# Patient Record
Sex: Female | Born: 1965
Health system: Southern US, Community
[De-identification: ages and names within clinical notes are randomized; demographics above are authoritative.]

## PROBLEM LIST (undated history)

## (undated) ENCOUNTER — Ambulatory Visit (HOSPITAL_COMMUNITY): Admission: EM | Source: Home / Self Care

## (undated) DIAGNOSIS — J449 Chronic obstructive pulmonary disease, unspecified: Secondary | ICD-10-CM

## (undated) DIAGNOSIS — F32A Depression, unspecified: Secondary | ICD-10-CM

## (undated) DIAGNOSIS — F1910 Other psychoactive substance abuse, uncomplicated: Secondary | ICD-10-CM

## (undated) DIAGNOSIS — J45909 Unspecified asthma, uncomplicated: Secondary | ICD-10-CM

## (undated) DIAGNOSIS — F419 Anxiety disorder, unspecified: Secondary | ICD-10-CM

## (undated) DIAGNOSIS — M545 Low back pain, unspecified: Secondary | ICD-10-CM

## (undated) DIAGNOSIS — B009 Herpesviral infection, unspecified: Secondary | ICD-10-CM

## (undated) DIAGNOSIS — R519 Headache, unspecified: Secondary | ICD-10-CM

## (undated) DIAGNOSIS — F329 Major depressive disorder, single episode, unspecified: Secondary | ICD-10-CM

## (undated) DIAGNOSIS — R51 Headache: Secondary | ICD-10-CM

## (undated) DIAGNOSIS — D259 Leiomyoma of uterus, unspecified: Secondary | ICD-10-CM

## (undated) DIAGNOSIS — R0600 Dyspnea, unspecified: Secondary | ICD-10-CM

## (undated) DIAGNOSIS — Z9889 Other specified postprocedural states: Secondary | ICD-10-CM

## (undated) DIAGNOSIS — N9489 Other specified conditions associated with female genital organs and menstrual cycle: Secondary | ICD-10-CM

## (undated) DIAGNOSIS — K769 Liver disease, unspecified: Secondary | ICD-10-CM

## (undated) DIAGNOSIS — J302 Other seasonal allergic rhinitis: Secondary | ICD-10-CM

## (undated) DIAGNOSIS — G5600 Carpal tunnel syndrome, unspecified upper limb: Secondary | ICD-10-CM

## (undated) DIAGNOSIS — M1990 Unspecified osteoarthritis, unspecified site: Secondary | ICD-10-CM

## (undated) DIAGNOSIS — Z9989 Dependence on other enabling machines and devices: Secondary | ICD-10-CM

## (undated) DIAGNOSIS — I499 Cardiac arrhythmia, unspecified: Secondary | ICD-10-CM

## (undated) DIAGNOSIS — K219 Gastro-esophageal reflux disease without esophagitis: Secondary | ICD-10-CM

## (undated) DIAGNOSIS — G8929 Other chronic pain: Secondary | ICD-10-CM

## (undated) DIAGNOSIS — R0789 Other chest pain: Secondary | ICD-10-CM

## (undated) DIAGNOSIS — D573 Sickle-cell trait: Secondary | ICD-10-CM

## (undated) DIAGNOSIS — I10 Essential (primary) hypertension: Secondary | ICD-10-CM

## (undated) DIAGNOSIS — R112 Nausea with vomiting, unspecified: Secondary | ICD-10-CM

## (undated) DIAGNOSIS — Z9189 Other specified personal risk factors, not elsewhere classified: Secondary | ICD-10-CM

## (undated) DIAGNOSIS — N898 Other specified noninflammatory disorders of vagina: Secondary | ICD-10-CM

## (undated) HISTORY — DX: Essential (primary) hypertension: I10

## (undated) HISTORY — DX: Sickle-cell trait: D57.3

## (undated) HISTORY — PX: TUBAL LIGATION: SHX77

## (undated) HISTORY — DX: Other specified conditions associated with female genital organs and menstrual cycle: N94.89

## (undated) HISTORY — DX: Leiomyoma of uterus, unspecified: D25.9

## (undated) HISTORY — PX: SALPINGECTOMY: SHX328

## (undated) HISTORY — DX: Other seasonal allergic rhinitis: J30.2

## (undated) HISTORY — DX: Chronic obstructive pulmonary disease, unspecified: J44.9

## (undated) HISTORY — DX: Liver disease, unspecified: K76.9

---
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
## 1998-03-10 ENCOUNTER — Emergency Department (HOSPITAL_COMMUNITY): Admission: EM | Admit: 1998-03-10 | Discharge: 1998-03-10 | Payer: Self-pay | Admitting: Emergency Medicine

## 1998-06-04 ENCOUNTER — Encounter: Payer: Self-pay | Admitting: Emergency Medicine

## 1998-06-04 ENCOUNTER — Emergency Department (HOSPITAL_COMMUNITY): Admission: EM | Admit: 1998-06-04 | Discharge: 1998-06-04 | Payer: Self-pay | Admitting: Emergency Medicine

## 1998-09-14 ENCOUNTER — Encounter: Payer: Self-pay | Admitting: Emergency Medicine

## 1998-09-14 ENCOUNTER — Emergency Department (HOSPITAL_COMMUNITY): Admission: EM | Admit: 1998-09-14 | Discharge: 1998-09-14 | Payer: Self-pay | Admitting: Emergency Medicine

## 2000-12-10 ENCOUNTER — Emergency Department (HOSPITAL_COMMUNITY): Admission: EM | Admit: 2000-12-10 | Discharge: 2000-12-10 | Payer: Self-pay | Admitting: Emergency Medicine

## 2000-12-10 ENCOUNTER — Encounter: Payer: Self-pay | Admitting: Emergency Medicine

## 2004-03-28 ENCOUNTER — Emergency Department (HOSPITAL_COMMUNITY): Admission: EM | Admit: 2004-03-28 | Discharge: 2004-03-28 | Payer: Self-pay | Admitting: Family Medicine

## 2004-09-24 ENCOUNTER — Emergency Department (HOSPITAL_COMMUNITY): Admission: EM | Admit: 2004-09-24 | Discharge: 2004-09-24 | Payer: Self-pay | Admitting: *Deleted

## 2005-08-26 ENCOUNTER — Emergency Department (HOSPITAL_COMMUNITY): Admission: EM | Admit: 2005-08-26 | Discharge: 2005-08-26 | Payer: Self-pay | Admitting: Emergency Medicine

## 2006-09-29 ENCOUNTER — Emergency Department (HOSPITAL_COMMUNITY): Admission: EM | Admit: 2006-09-29 | Discharge: 2006-09-29 | Payer: Self-pay | Admitting: Emergency Medicine

## 2007-05-10 ENCOUNTER — Emergency Department (HOSPITAL_COMMUNITY): Admission: EM | Admit: 2007-05-10 | Discharge: 2007-05-10 | Payer: Self-pay | Admitting: Emergency Medicine

## 2008-05-01 ENCOUNTER — Emergency Department (HOSPITAL_COMMUNITY): Admission: EM | Admit: 2008-05-01 | Discharge: 2008-05-02 | Payer: Self-pay | Admitting: Emergency Medicine

## 2008-11-26 ENCOUNTER — Emergency Department (HOSPITAL_COMMUNITY): Admission: EM | Admit: 2008-11-26 | Discharge: 2008-11-27 | Payer: Self-pay | Admitting: Emergency Medicine

## 2010-08-25 ENCOUNTER — Encounter (HOSPITAL_COMMUNITY): Payer: Self-pay

## 2010-08-25 ENCOUNTER — Ambulatory Visit (INDEPENDENT_AMBULATORY_CARE_PROVIDER_SITE_OTHER): Payer: Self-pay

## 2010-08-25 ENCOUNTER — Inpatient Hospital Stay (INDEPENDENT_AMBULATORY_CARE_PROVIDER_SITE_OTHER)
Admission: RE | Admit: 2010-08-25 | Discharge: 2010-08-25 | Disposition: A | Discharge status: Home / Self Care | Payer: Self-pay | Source: Ambulatory Visit | Attending: Family Medicine | Admitting: Family Medicine

## 2010-08-25 DIAGNOSIS — M542 Cervicalgia: Secondary | ICD-10-CM

## 2010-10-23 ENCOUNTER — Emergency Department (HOSPITAL_COMMUNITY)
Admission: EM | Admit: 2010-10-23 | Discharge: 2010-10-23 | Disposition: A | Discharge status: Home / Self Care | Payer: Self-pay | Attending: Emergency Medicine | Admitting: Emergency Medicine

## 2010-10-23 ENCOUNTER — Emergency Department (HOSPITAL_COMMUNITY): Payer: Self-pay

## 2010-10-23 DIAGNOSIS — R209 Unspecified disturbances of skin sensation: Secondary | ICD-10-CM | POA: Insufficient documentation

## 2010-10-23 DIAGNOSIS — R29898 Other symptoms and signs involving the musculoskeletal system: Secondary | ICD-10-CM | POA: Insufficient documentation

## 2010-10-23 DIAGNOSIS — M542 Cervicalgia: Secondary | ICD-10-CM | POA: Insufficient documentation

## 2010-10-23 DIAGNOSIS — M546 Pain in thoracic spine: Secondary | ICD-10-CM | POA: Insufficient documentation

## 2010-10-23 DIAGNOSIS — M25519 Pain in unspecified shoulder: Secondary | ICD-10-CM | POA: Insufficient documentation

## 2010-10-23 DIAGNOSIS — R071 Chest pain on breathing: Secondary | ICD-10-CM | POA: Insufficient documentation

## 2010-10-23 LAB — CK TOTAL AND CKMB (NOT AT ARMC)
CK, MB: 2.2 ng/mL (ref 0.3–4.0)
Total CK: 123 U/L (ref 7–177)

## 2010-10-23 LAB — DIFFERENTIAL
Basophils Relative: 1 % (ref 0–1)
Monocytes Relative: 7 % (ref 3–12)
Neutro Abs: 3.6 10*3/uL (ref 1.7–7.7)
Neutrophils Relative %: 46 % (ref 43–77)

## 2010-10-23 LAB — COMPREHENSIVE METABOLIC PANEL
ALT: 12 U/L (ref 0–35)
AST: 16 U/L (ref 0–37)
Albumin: 3.9 g/dL (ref 3.5–5.2)
Calcium: 9.5 mg/dL (ref 8.4–10.5)
Sodium: 137 mEq/L (ref 135–145)
Total Protein: 8.1 g/dL (ref 6.0–8.3)

## 2010-10-23 LAB — TROPONIN I: Troponin I: <0.3 ng/mL (ref <0.30)

## 2010-10-23 LAB — CBC
Hemoglobin: 13.6 g/dL (ref 12.0–15.0)
MCH: 32.9 pg (ref 26.0–34.0)
RBC: 4.14 MIL/uL (ref 3.87–5.11)

## 2011-10-23 ENCOUNTER — Encounter (HOSPITAL_COMMUNITY): Payer: Self-pay | Admitting: Emergency Medicine

## 2011-10-23 DIAGNOSIS — K029 Dental caries, unspecified: Secondary | ICD-10-CM | POA: Insufficient documentation

## 2011-10-23 DIAGNOSIS — F17200 Nicotine dependence, unspecified, uncomplicated: Secondary | ICD-10-CM | POA: Insufficient documentation

## 2011-10-23 DIAGNOSIS — R51 Headache: Secondary | ICD-10-CM | POA: Insufficient documentation

## 2011-10-23 NOTE — ED Notes (Signed)
Patient with headache and dental pain.  Patient states that the headache started yesterday and dental pain a few days ago.  Pain radiates into neck.

## 2011-10-24 ENCOUNTER — Emergency Department (HOSPITAL_COMMUNITY)
Admission: EM | Admit: 2011-10-24 | Discharge: 2011-10-24 | Disposition: A | Discharge status: Home / Self Care | Payer: Self-pay | Attending: Emergency Medicine | Admitting: Emergency Medicine

## 2011-10-24 DIAGNOSIS — K0889 Other specified disorders of teeth and supporting structures: Secondary | ICD-10-CM

## 2011-10-24 DIAGNOSIS — R51 Headache: Secondary | ICD-10-CM

## 2011-10-24 DIAGNOSIS — K029 Dental caries, unspecified: Secondary | ICD-10-CM

## 2011-10-24 MED ORDER — PENICILLIN V POTASSIUM 500 MG PO TABS: 500.0000 mg | ORAL_TABLET | Freq: Four times a day (QID) | ORAL | Status: AC

## 2011-10-24 MED ORDER — NAPROXEN 500 MG PO TABS: 500.0000 mg | ORAL_TABLET | Freq: Two times a day (BID) | ORAL | Status: DC

## 2011-10-24 MED ORDER — KETOROLAC TROMETHAMINE 60 MG/2ML IM SOLN
60.0000 mg | Freq: Once | INTRAMUSCULAR | Status: AC
  Administered 2011-10-24: 60 mg via INTRAMUSCULAR
  Filled 2011-10-24: qty 2

## 2011-10-24 MED ORDER — HYDROCODONE-ACETAMINOPHEN 5-500 MG PO TABS: 1.0000 | ORAL_TABLET | Freq: Four times a day (QID) | ORAL | Status: AC | PRN

## 2011-10-24 NOTE — ED Provider Notes (Signed)
History     CSN: [id number]  Arrival date & time 10/23/11  2030   First MD Initiated Contact with Patient 10/24/11 0130      Chief Complaint  Patient presents with  . Migraine  . Dental Pain    (Consider location/radiation/quality/duration/timing/severity/associated sxs/prior treatment) HPI Comments: Headache - started 2 weeks ago - feels like a migraine - is on the L side, no radiation, sharp pain, has been intermttent but became more intense and constant today.  Has hx of headaches in the past that are similar to this - but this one hasn't gone away today.    Toothache - is on the L upper jaw mid jaw - onset 2 days ago - has had intermittent problems in the past.  Had subjective fever, had aleve with no improvement.  Has mild swelling.  No n/v.  No blurred vision or diplopia.  Sx are constant, worse with chewing and temp sensitive.  Patient is a 46 y.o. female presenting with migraine and tooth pain.  Migraine  Dental Pain   History reviewed. No pertinent past medical history.  History reviewed. No pertinent past surgical history.  No family history on file.  History  Substance Use Topics  . Smoking status: Current Everyday Smoker  . Smokeless tobacco: Not on file  . Alcohol Use: No    OB History    Grav Para Term Preterm Abortions TAB SAB Ect Mult Living                  Review of Systems  All other systems reviewed and are negative.    Allergies  Review of patient's allergies indicates no known allergies.  Home Medications   Current Outpatient Rx  Name Route Sig Dispense Refill  . ACETAMINOPHEN 325 MG PO TABS Oral Take 1,300 mg by mouth every 6 (six) hours as needed. For pain    . IBUPROFEN 200 MG PO TABS Oral Take 400 mg by mouth every 6 (six) hours as needed. For pain    . NAPROXEN SODIUM 220 MG PO TABS Oral Take 440 mg by mouth 2 (two) times daily as needed. For pain    . VALACYCLOVIR HCL 500 MG PO TABS Oral Take 500 mg by mouth daily.    Marland Kitchen  HYDROCODONE-ACETAMINOPHEN 5-500 MG PO TABS Oral Take 1-2 tablets by mouth every 6 (six) hours as needed for pain. 15 tablet 0  . NAPROXEN 500 MG PO TABS Oral Take 1 tablet (500 mg total) by mouth 2 (two) times daily with a meal. 30 tablet 0  . PENICILLIN V POTASSIUM 500 MG PO TABS Oral Take 1 tablet (500 mg total) by mouth 4 (four) times daily. 40 tablet 0    BP 147/109  Pulse 54  Temp 98.8 F (37.1 C) (Oral)  Resp 18  SpO2 99%  LMP 10/06/2011  Physical Exam  Constitutional: She appears well-developed and well-nourished.  HENT:       Oropharynx is clear, mucous membranes are moist, dental disease focal, has deep caries in the left upper premolar, tender to touch and palpation, no surrounding gingival abscess or asymmetry of the face  Eyes:       Conjunctiva are clear, no icterus, no discharge  Neck: Normal range of motion. Neck supple.       No swelling or lymphadenopathy, supple neck  Cardiovascular: Normal rate, regular rhythm and intact distal pulses.   No murmur heard. Pulmonary/Chest: Effort normal and breath sounds normal. No respiratory distress. She has  no wheezes. She has no rales.  Abdominal: Soft. She exhibits no distension. There is no tenderness.    ED Course  Procedures (including critical care time)  Labs Reviewed - No data to display No results found.   1. Headache   2. Toothache   3. Dental caries       MDM  The patient is well-appearing, has headaches that are similar to those in the past, her headache today is left-sided consistent with a left-sided toothache, she has no other neurologic deficits, her gait is normal, speech is clear, strength and sensation are normal and has no other neurologic complaints. Intramuscular Toradol given, discharged with medications and resource list for dental followup.  Discharge Prescriptions include:  #1 Naprosyn  #2 Vicodin  #3 penicillin        Vida Roller, MD 10/24/11 [phone]

## 2011-10-24 NOTE — ED Notes (Signed)
Name called to update vitals.  No answer

## 2011-10-24 NOTE — Discharge Instructions (Signed)
RESOURCE GUIDE  Chronic Pain Problems: Contact [facility]  [phone] Patients need to be referred by their primary care doctor.  Insufficient Money for Medicine: Contact United Way:  call "211" or [facility] [phone].  No Primary Care Doctor: - Call [facility]  [phone] - can help you locate a primary care doctor that  accepts your insurance, provides certain services, etc. - Physician Referral Service- 1-800-533-3463  Agencies that provide inexpensive medical care: - [facility]  [phone] - [facility]  [phone] - [facility]  [phone] - [facility]  [phone] - [facility] Parenthood  [phone] - [facility]  [phone]  Medicaid-accepting [place] Providers: - [facility]- [address]  [phone], Mon-Fri 9am-7pm, Sat 9am-1pm - [facility]- [address], [address]  [phone] - [facility]- [address]  [phone] - [facility]- [address] [place] [address]  [phone] - Veita Bland- [address], [address], [phone]  Only accepts [place] Access Medicaid patients after they have their name  applied to their card  Self Pay (no insurance) in [place]: - Sickle Cell Patients: Dr Eric Dean, [facility]  [address], [phone] - [facility]- [address]  [phone]       -     [facility] [place]- [address]       -     [facility]- see information above (Speak to Pam H if you do not have insurance)       -  [facility]- [address], [phone]       -  [facility] [place]- [address],  [phone]       -  [facility]- [address] [place] [address], [phone]       -  Dr Osei-Bonsu-  [address], [place], [phone]       -  [facility]- [address], [phone]       -  [facility]- [address] [place] [address], [phone], also [address], [phone]       -    [facility]- [address], [phone], 1st & 3rd Saturday   every month, 10am-1pm  1) Find a Doctor and Pay Out of Pocket Although you won't have to find out who is covered by your insurance plan, it is a good idea to ask around and get recommendations. You will then need to call the office and see if the doctor you have chosen will accept you as a new patient and what types of options they offer for patients who are self-pay. Some doctors offer discounts or will set up payment plans for their patients who do not have insurance, but you will need to ask so you aren't surprised when you get to your appointment.  2) Contact Your Local Health Department Not all health departments have doctors that can see patients for sick visits, but many do, so it is worth a call to see if yours does. If you don't know where your local health department is, you can check in your phone book. The CDC also has a tool to help you locate your state's health department, and many state websites also have   listings of all of their local health departments.  3) Find a Walk-in Clinic If your illness is not likely to be very severe or complicated, you may want to try a walk in clinic. These are popping up all over the country in pharmacies, drugstores, and shopping centers. They're usually staffed by nurse practitioners or physician assistants that have been trained to treat common illnesses and complaints. They're usually fairly quick and inexpensive. However, if you have serious medical issues or chronic medical problems, these are probably not your best option  STD Testing - [place] [facility] [place], [facility], [address], [place], phone [phone] or [phone].  Monday - Friday, call for an appointment. - [place]  [facility] [place], [facility] Clinic, [address], [place], phone [phone] or [phone].  Monday - Friday, call for an appointment.  Abuse/Neglect: - [place] Child Abuse Hotline (336) 641-3795 - [place] Child Abuse Hotline 800-378-5315 (After Hours)  Emergency Shelter:  [facility] (336) 271-5985  Maternity Homes: - [facility] (336) 275-9566 - [facility] (704) 372-4663  MRSA Hotline #:   832-7006  [place] Resources  [facility] [place]  [employer] [place] [facility] [address]                 [address]         [address]  [place]                                               [place]                              [place] Phone:  [phone]                                  Phone:  [phone]                   Phone:  [phone]  [place] [facility], [phone] - [place] Services - [facility]- 1-888-581-9988       -     [facility] [place], [address],                                  336-349-4454, Insurance  [place] Child Abuse Hotline (336) 342-1394 or (336) 342-3537 (After Hours)   Behavioral Health Services  Substance Abuse Resources: - [facility]  336-882-2125 - [facility] 336-784-9470 - [facility] 336-285-9073 - [facility] 336-845-3988 - Residential & Outpatient Substance Abuse Program  800-659-3381  Psychological Services: - [facility]  [phone] - [facility]  [phone] - [place] [facility], [address], [place], ACCESS LINE: 1-800-853-5163 or 336-641-4981, Http://www.[facility].com/services/adult.htm  Dental Assistance  If unable to pay or uninsured, contact:  [facility] or [place] [facility] to become qualified for the adult dental  clinic.  Patients with Medicaid:  [facility] [address], [phone], [phone]  If unable   to pay, or uninsured, contact [facility] ([phone]) or [place] [facility] ([phone] in [place], [phone] in [place]) to become qualified for the adult dental clinic  Other Low-Cost Community Dental Services: - [facility]- [address], [place], [place], [address], [phone], Ext. 123, 2nd and 4th Thursday of the month at 6:30am.  10 clients each day by appointment, can sometimes see walk-in patients if someone does not show for an appointment. - [facility]- [address], [place], [place], [address], [phone] - [facility]- [address], [place], [place], [address], [phone] - [place] [facility]- [phone] - [facility]- [phone] - [facility]- [phone]    

## 2011-10-24 NOTE — ED Notes (Signed)
PT ambulated with a steady gait; VSS; A&Ox3; no signs of distress; respirations even and unlabored; skin warm and dry; no questions.

## 2012-05-20 ENCOUNTER — Emergency Department (HOSPITAL_COMMUNITY): Payer: Self-pay

## 2012-05-20 ENCOUNTER — Encounter (HOSPITAL_COMMUNITY): Payer: Self-pay | Admitting: Emergency Medicine

## 2012-05-20 ENCOUNTER — Emergency Department (HOSPITAL_COMMUNITY)
Admission: EM | Admit: 2012-05-20 | Discharge: 2012-05-20 | Disposition: A | Discharge status: Home / Self Care | Payer: Self-pay | Attending: Emergency Medicine | Admitting: Emergency Medicine

## 2012-05-20 DIAGNOSIS — F17200 Nicotine dependence, unspecified, uncomplicated: Secondary | ICD-10-CM | POA: Insufficient documentation

## 2012-05-20 DIAGNOSIS — K0889 Other specified disorders of teeth and supporting structures: Secondary | ICD-10-CM

## 2012-05-20 DIAGNOSIS — K089 Disorder of teeth and supporting structures, unspecified: Secondary | ICD-10-CM | POA: Insufficient documentation

## 2012-05-20 LAB — POCT I-STAT, CHEM 8
BUN: 11 mg/dL (ref 6–23)
Calcium, Ion: 1.15 mmol/L (ref 1.12–1.23)
Chloride: 109 mEq/L (ref 96–112)
Creatinine, Ser: 0.9 mg/dL (ref 0.50–1.10)
Glucose, Bld: 90 mg/dL (ref 70–99)
HCT: 37 % (ref 36.0–46.0)
Hemoglobin: 12.6 g/dL (ref 12.0–15.0)
Potassium: 3.8 meq/L (ref 3.5–5.1)
Sodium: 138 meq/L (ref 135–145)
TCO2: 22 mmol/L (ref 0–100)

## 2012-05-20 LAB — CBC WITH DIFFERENTIAL/PLATELET
Basophils Absolute: 0 K/uL (ref 0.0–0.1)
Basophils Relative: 0 % (ref 0–1)
Eosinophils Absolute: 0.3 10*3/uL (ref 0.0–0.7)
Eosinophils Relative: 5 % (ref 0–5)
HCT: 36 % (ref 36.0–46.0)
Hemoglobin: 11.9 g/dL — ABNORMAL LOW (ref 12.0–15.0)
Lymphocytes Relative: 53 % — ABNORMAL HIGH (ref 12–46)
Lymphs Abs: 3 K/uL (ref 0.7–4.0)
MCH: 32.1 pg (ref 26.0–34.0)
MCHC: 33.1 g/dL (ref 30.0–36.0)
MCV: 97 fL (ref 78.0–100.0)
Monocytes Absolute: 0.5 10*3/uL (ref 0.1–1.0)
Monocytes Relative: 8 % (ref 3–12)
Neutro Abs: 1.9 K/uL (ref 1.7–7.7)
Neutrophils Relative %: 33 % — ABNORMAL LOW (ref 43–77)
Platelets: 365 K/uL (ref 150–400)
RBC: 3.71 MIL/uL — ABNORMAL LOW (ref 3.87–5.11)
RDW: 13 % (ref 11.5–15.5)
WBC: 5.6 K/uL (ref 4.0–10.5)

## 2012-05-20 MED ORDER — PENICILLIN V POTASSIUM 500 MG PO TABS: 500.0000 mg | ORAL_TABLET | Freq: Four times a day (QID) | ORAL | Status: AC

## 2012-05-20 MED ORDER — IBUPROFEN 800 MG PO TABS: 800.0000 mg | ORAL_TABLET | Freq: Three times a day (TID) | ORAL | Status: DC | PRN

## 2012-05-20 NOTE — ED Provider Notes (Signed)
History     CSN: [id number]  Arrival date & time 05/20/12  5427   First MD Initiated Contact with Patient 05/20/12 [phone]      Chief Complaint  Patient presents with  . Dental Pain     HPI chief complaint: Dental pain. Onset: Several days ago. Location right mandibular molar. Worsened with eating. Quality:dull. Severity: Mild to moderate. Timing: Constant. For signs and symptoms the review of systems. Social history: See nurse's notes. Positive family history for myocardial infarction. I have reviewed patient's past medical, past surgical, social history as well as medications and allergies.  History reviewed. No pertinent past medical history.  No past surgical history on file.  No family history on file.  History  Substance Use Topics  . Smoking status: Current Every Day Smoker  . Smokeless tobacco: Not on file  . Alcohol Use: No    OB History    Grav Para Term Preterm Abortions TAB SAB Ect Mult Living                  Review of Systems  Constitutional: Negative for fever and chills.  HENT: Positive for dental problem. Negative for congestion, sore throat, rhinorrhea, drooling, mouth sores, trouble swallowing, neck pain, neck stiffness, voice change and sinus pressure.   Eyes: Negative for pain, discharge and itching.  Respiratory: Negative for cough, chest tightness and shortness of breath.   Cardiovascular: Negative for chest pain, palpitations and leg swelling.       Patient felt like her heart was going fast earlier.  Gastrointestinal: Negative for nausea, vomiting, abdominal pain, diarrhea, constipation and blood in stool.  Genitourinary: Negative for dysuria, urgency, frequency, hematuria, flank pain, decreased urine volume, difficulty urinating and pelvic pain.  Musculoskeletal: Negative for back pain and joint swelling.  Skin: Negative for rash and wound.  Neurological: Negative for dizziness, tremors, seizures, syncope, facial asymmetry, speech difficulty,  weakness, light-headedness, numbness and headaches.  Hematological: Negative for adenopathy. Does not bruise/bleed easily.  Psychiatric/Behavioral: Negative for confusion and decreased concentration.    Allergies  Review of patient's allergies indicates no known allergies.  Home Medications   Current Outpatient Rx  Name  Route  Sig  Dispense  Refill  . GOODY HEADACHE PO   Oral   Take 1 packet by mouth daily as needed. For pain         . IBUPROFEN 200 MG PO TABS   Oral   Take 400 mg by mouth every 6 (six) hours as needed. For pain         . OXYCONTIN PO   Oral   Take 1 tablet by mouth every 4 (four) hours as needed. For pain           BP 196/84  Pulse 49  Temp 98.3 F (36.8 C) (Oral)  Resp 23  SpO2 100%  Physical Exam  Constitutional: She is oriented to person, place, and time. She appears well-developed and well-nourished. No distress.  HENT:  Head: Normocephalic and atraumatic. No trismus in the jaw.  Mouth/Throat: Uvula is midline, oropharynx is clear and moist and mucous membranes are normal. No oral lesions. No dental abscesses, uvula swelling, lacerations or dental caries.         Right mandibular molar percussion tenderness.  Eyes: Conjunctivae normal are normal. Right eye exhibits no discharge. Left eye exhibits no discharge. No scleral icterus.  Neck: Normal range of motion. Neck supple. No JVD present. No tracheal deviation present.  Cardiovascular: Normal rate, regular  rhythm, normal heart sounds and intact distal pulses.   No murmur heard. Pulmonary/Chest: Effort normal and breath sounds normal. No stridor. No respiratory distress. She has no wheezes. She has no rales. She exhibits no tenderness.       Heart rate 60 in sinus rhythm on monitor at time of exam.  Abdominal: Soft. Bowel sounds are normal. She exhibits no distension and no mass. There is no tenderness. There is no rebound and no guarding.  Musculoskeletal: Normal range of motion. She  exhibits no tenderness.  Lymphadenopathy:    She has no cervical adenopathy.  Neurological: She is alert and oriented to person, place, and time.  Skin: Skin is warm and dry. She is not diaphoretic.  Psychiatric: She has a normal mood and affect.    ED Course  Dental Date/Time: 05/20/2012 10:23 AM Performed by: Consuello Masse Authorized by: Lear Ng Comments: Inferior alveolar nerve block performed on the right. Bupivacaine with epi used. Patient tolerated well. Excellent analgesia.   (including critical care time)  Labs Reviewed  CBC WITH DIFFERENTIAL - Abnormal; Notable for the following:    RBC 3.71 (*)     Hemoglobin 11.9 (*)     Neutrophils Relative 33 (*)     Lymphocytes Relative 53 (*)     All other components within normal limits  POCT I-STAT, CHEM 8   Dg Chest 2 View  05/20/2012  *RADIOLOGY REPORT*  Clinical Data: Dental pain, cough, shortness of breath, fever  CHEST - 2 VIEW  Comparison: 10/23/2010  Findings: Upper-normal size of cardiac silhouette. Mediastinal contours and pulmonary vascularity normal. Minimal chronic bronchitic changes and hyperinflation. No acute infiltrate, pleural effusion, or pneumothorax. Probable nipple shadow left base Bones appear demineralized.  IMPRESSION: Minimal chronic bronchitic changes.   Original Report Authenticated By: Ulyses Southward, M.D.      1. Pain, dental     EKG reviewed and interpreted. Sinus bradycardia rate 48. Indeterminate axis. Normal intervals. No T wave abnormalities. No ST segment changes. MDM  Patient is a well-appearing 47 year old female with several days of dental pain presenting with similar complaint as well as fast heartbeat. Patient states she thought her heart was going fast earlier while she was in the emergency department with a heart rate of 50s to 60s. No evidence of arrhythmia given patient has active symptoms and normal heart rate/mild bradycardia. And of note initial EKG had a wandering baseline but  showed ST segment elevations in the inferior leads. Feel this is likely artifactual given a repeat EKG does not demonstrate this. Nor did the initial EKG have any reciprocal changes. Patient denies any chest pain, shortness of breath, dizziness, syncope. Arrhythmia and acute coronary syndrome are very doubtful at this time. Patient's main complaint is dental pain. Percussion tenderness over the right mandibular molars. No obvious dental caries or periapical abscess. Excellent pain relief with right inferior alveolar nerve block. Patient discharged with Pen VK and dentistry followup today.        Consuello Masse, MD 05/20/12 1236

## 2012-05-20 NOTE — ED Notes (Signed)
Pt discharged.Vital signs stable and GCS 15 

## 2012-05-20 NOTE — ED Notes (Signed)
Pt presented to ED with dental pain.Pt says she tooth ibrufen tab pt felt heat racing with no chest pain.

## 2012-05-21 NOTE — ED Provider Notes (Signed)
I saw and evaluated the patient, reviewed the resident's note and I agree with the findings and plan.  I was available for all key portions of dental procedure, nerve block.  ECG shows asymptomatic bradycardia, but otherwise normal.  Needs follow up with dentist and referral provided.  Nor airway compromise.    Gavin Pound. Oletta Lamas, MD 05/21/12 [phone]

## 2013-05-06 ENCOUNTER — Encounter (HOSPITAL_COMMUNITY): Payer: Self-pay | Admitting: Emergency Medicine

## 2013-05-06 ENCOUNTER — Emergency Department (HOSPITAL_COMMUNITY)
Admission: EM | Admit: 2013-05-06 | Discharge: 2013-05-07 | Disposition: A | Discharge status: Home / Self Care | Payer: Self-pay | Attending: Emergency Medicine | Admitting: Emergency Medicine

## 2013-05-06 DIAGNOSIS — R197 Diarrhea, unspecified: Secondary | ICD-10-CM | POA: Insufficient documentation

## 2013-05-06 DIAGNOSIS — R5381 Other malaise: Secondary | ICD-10-CM | POA: Insufficient documentation

## 2013-05-06 DIAGNOSIS — Z87891 Personal history of nicotine dependence: Secondary | ICD-10-CM | POA: Insufficient documentation

## 2013-05-06 DIAGNOSIS — Z8619 Personal history of other infectious and parasitic diseases: Secondary | ICD-10-CM | POA: Insufficient documentation

## 2013-05-06 DIAGNOSIS — R1033 Periumbilical pain: Secondary | ICD-10-CM | POA: Insufficient documentation

## 2013-05-06 DIAGNOSIS — Z3202 Encounter for pregnancy test, result negative: Secondary | ICD-10-CM | POA: Insufficient documentation

## 2013-05-06 DIAGNOSIS — R110 Nausea: Secondary | ICD-10-CM

## 2013-05-06 DIAGNOSIS — R109 Unspecified abdominal pain: Secondary | ICD-10-CM

## 2013-05-06 DIAGNOSIS — J45909 Unspecified asthma, uncomplicated: Secondary | ICD-10-CM | POA: Insufficient documentation

## 2013-05-06 DIAGNOSIS — R630 Anorexia: Secondary | ICD-10-CM | POA: Insufficient documentation

## 2013-05-06 DIAGNOSIS — Z9851 Tubal ligation status: Secondary | ICD-10-CM | POA: Insufficient documentation

## 2013-05-06 DIAGNOSIS — R112 Nausea with vomiting, unspecified: Secondary | ICD-10-CM | POA: Insufficient documentation

## 2013-05-06 HISTORY — DX: Herpesviral infection, unspecified: B00.9

## 2013-05-06 HISTORY — DX: Unspecified asthma, uncomplicated: J45.909

## 2013-05-06 LAB — CBC WITH DIFFERENTIAL/PLATELET
Eosinophils Relative: 1 % (ref 0–5)
HCT: 40.2 % (ref 36.0–46.0)
Lymphocytes Relative: 25 % (ref 12–46)
Lymphs Abs: 2.3 10*3/uL (ref 0.7–4.0)
MCV: 95.5 fL (ref 78.0–100.0)
Monocytes Absolute: 0.6 10*3/uL (ref 0.1–1.0)
Monocytes Relative: 6 % (ref 3–12)
RBC: 4.21 MIL/uL (ref 3.87–5.11)
WBC: 9.3 10*3/uL (ref 4.0–10.5)

## 2013-05-06 LAB — POCT PREGNANCY, URINE: Preg Test, Ur: NEGATIVE

## 2013-05-06 LAB — COMPREHENSIVE METABOLIC PANEL
ALT: 13 U/L (ref 0–35)
BUN: 10 mg/dL (ref 6–23)
CO2: 23 mEq/L (ref 19–32)
Calcium: 9.4 mg/dL (ref 8.4–10.5)
Creatinine, Ser: 0.8 mg/dL (ref 0.50–1.10)
GFR calc Af Amer: >90 mL/min (ref >90)
GFR calc non Af Amer: 85 mL/min — ABNORMAL LOW (ref >90)
Glucose, Bld: 81 mg/dL (ref 70–99)

## 2013-05-06 MED ORDER — ONDANSETRON 4 MG PO TBDP
8.0000 mg | ORAL_TABLET | Freq: Once | ORAL | Status: AC
  Administered 2013-05-06: 8 mg via ORAL
  Filled 2013-05-06: qty 2

## 2013-05-06 MED ORDER — OXYCODONE-ACETAMINOPHEN 5-325 MG PO TABS
2.0000 | ORAL_TABLET | Freq: Once | ORAL | Status: AC
  Administered 2013-05-06: 2 via ORAL
  Filled 2013-05-06: qty 2

## 2013-05-06 NOTE — ED Notes (Signed)
Pt with acute onset peri-umbilical pain at 0300.  Pt states pain is now diffuse and describes pain as sharp and intermittent.  Emesis x 1 this am and watery x 4.

## 2013-05-07 ENCOUNTER — Emergency Department (HOSPITAL_COMMUNITY): Payer: Self-pay

## 2013-05-07 MED ORDER — HYDROCODONE-ACETAMINOPHEN 5-325 MG PO TABS: 1.0000 | ORAL_TABLET | ORAL | Status: DC | PRN

## 2013-05-07 MED ORDER — PROMETHAZINE HCL 25 MG PO TABS: 25.0000 mg | ORAL_TABLET | Freq: Four times a day (QID) | ORAL | Status: DC | PRN

## 2013-05-07 NOTE — ED Provider Notes (Signed)
CSN: [id number]     Arrival date & time 05/06/13  2000 History   First MD Initiated Contact with Patient 05/06/13 2204     Chief Complaint  Patient presents with  . abdominal pain,  emesis, diarrhea     HPI Per the patient's had nausea and some crampy abdominal pain.  sHe reported loose stools.  She denies melena or hematochezia.  No vomiting.  Mild decreased oral intake.  Generalized malaise.  No documented fever.  Pain is intermittent and involves all of her abdomen per the patient.  She states it's very sharp and crampy.  No vaginal complaints.  No urinary complaints.  Symptoms are mild to moderate in severity.  Nothing worsens or improves her symptoms   Past Medical History  Diagnosis Date  . Herpes   . Asthma    Past Surgical History  Procedure Laterality Date  . Tubal ligation     No family history on file. History  Substance Use Topics  . Smoking status: Former Research scientist (life sciences)  . Smokeless tobacco: Not on file  . Alcohol Use: No   OB History   Grav Para Term Preterm Abortions TAB SAB Ect Mult Living                 Review of Systems  All other systems reviewed and are negative.    Allergies  Review of patient's allergies indicates no known allergies.  Home Medications   Current Outpatient Rx  Name  Route  Sig  Dispense  Refill  . HYDROcodone-acetaminophen (NORCO/VICODIN) 5-325 MG per tablet   Oral   Take 1 tablet by mouth every 4 (four) hours as needed for moderate pain.   8 tablet   0   . promethazine (PHENERGAN) 25 MG tablet   Oral   Take 1 tablet (25 mg total) by mouth every 6 (six) hours as needed for nausea or vomiting.   30 tablet   0    BP 109/56  Pulse 57  Temp(Src) 98 F (36.7 C) (Oral)  Resp 22  Wt 130 lb 2 oz (59.024 kg)  SpO2 99%  LMP 04/20/2013 Physical Exam  Nursing note and vitals reviewed. Constitutional: She is oriented to person, place, and time. She appears well-developed and well-nourished. No distress.  HENT:  Head:  Normocephalic and atraumatic.  Eyes: EOM are normal.  Neck: Normal range of motion.  Cardiovascular: Normal rate, regular rhythm and normal heart sounds.   Pulmonary/Chest: Effort normal and breath sounds normal.  Abdominal: Soft. She exhibits no distension. There is no tenderness. There is no rebound and no guarding.  Musculoskeletal: Normal range of motion.  Neurological: She is alert and oriented to person, place, and time.  Skin: Skin is warm and dry.  Psychiatric: She has a normal mood and affect. Judgment normal.    ED Course  Procedures (including critical care time) Labs Review Labs Reviewed  CBC WITH DIFFERENTIAL - Abnormal; Notable for the following:    Platelets 417 (*)    All other components within normal limits  COMPREHENSIVE METABOLIC PANEL - Abnormal; Notable for the following:    GFR calc non Af Amer 85 (*)    All other components within normal limits  LIPASE, BLOOD  POCT PREGNANCY, URINE   Imaging Review Dg Abd 2 Views  05/07/2013   CLINICAL DATA:  Recent flu. Centralized abdominal pain since 3 a.m. yesterday. Nausea and vomiting.  EXAM: ABDOMEN - 2 VIEW  COMPARISON:  None.  FINDINGS: Scattered gas and stool  in the colon. No small or large bowel distention. No free intra-abdominal air. No abnormal air-fluid levels. No radiopaque stones. A metallic staple is projected over the right lower quadrant. Visualized bones appear intact.  IMPRESSION: Nonobstructive bowel gas pattern.   Electronically Signed   By: Lucienne Capers M.D.   On: 05/07/2013 01:10  I personally reviewed the imaging tests through PACS system I reviewed available ER/hospitalization records through the EMR   EKG Interpretation   None       MDM   1. Nausea   2. Abdominal pain    1:38 AM Is much better at this point.  Abdominal exam is benign.  Nonobstructive bowel gas pattern.  I suspect this is more of a viral process with nausea and intermittent crampy abdominal pain.  Home with  antibiotics.  Short course of pain medicine for her crampy abdominal pain.  She understands return to ER for new or worsening symptoms  Hoy Morn, MD 05/07/13 [phone]

## 2013-05-07 NOTE — Discharge Instructions (Signed)

## 2013-06-14 ENCOUNTER — Emergency Department (HOSPITAL_COMMUNITY)
Admission: EM | Admit: 2013-06-14 | Discharge: 2013-06-14 | Disposition: A | Discharge status: Home / Self Care | Payer: Self-pay | Attending: Emergency Medicine | Admitting: Emergency Medicine

## 2013-06-14 ENCOUNTER — Encounter (HOSPITAL_COMMUNITY): Payer: Self-pay | Admitting: Emergency Medicine

## 2013-06-14 DIAGNOSIS — K089 Disorder of teeth and supporting structures, unspecified: Secondary | ICD-10-CM | POA: Insufficient documentation

## 2013-06-14 DIAGNOSIS — J45909 Unspecified asthma, uncomplicated: Secondary | ICD-10-CM | POA: Insufficient documentation

## 2013-06-14 DIAGNOSIS — Z8619 Personal history of other infectious and parasitic diseases: Secondary | ICD-10-CM | POA: Insufficient documentation

## 2013-06-14 DIAGNOSIS — K0889 Other specified disorders of teeth and supporting structures: Secondary | ICD-10-CM

## 2013-06-14 DIAGNOSIS — Z87891 Personal history of nicotine dependence: Secondary | ICD-10-CM | POA: Insufficient documentation

## 2013-06-14 MED ORDER — OXYCODONE-ACETAMINOPHEN 5-325 MG PO TABS: 1.0000 | ORAL_TABLET | ORAL | Status: DC | PRN

## 2013-06-14 MED ORDER — IBUPROFEN 800 MG PO TABS: 800.0000 mg | ORAL_TABLET | Freq: Three times a day (TID) | ORAL | Status: DC

## 2013-06-14 MED ORDER — PENICILLIN V POTASSIUM 500 MG PO TABS: 500.0000 mg | ORAL_TABLET | Freq: Four times a day (QID) | ORAL | Status: AC

## 2013-06-14 MED ORDER — HYDROMORPHONE HCL PF 1 MG/ML IJ SOLN
1.0000 mg | Freq: Once | INTRAMUSCULAR | Status: AC
  Administered 2013-06-14: 1 mg via INTRAMUSCULAR
  Filled 2013-06-14: qty 1

## 2013-06-14 NOTE — ED Provider Notes (Signed)
CSN: [id number]     Arrival date & time 06/14/13  0941 History  This chart was scribed for Jeannett Senior, PA, working with Kathalene Frames, MD, by Sydell Axon, ED Scribe. This patient was seen in room TR07C/TR07C and the patient's care was started at 10:49 AM.   Chief Complaint  Patient presents with  . Dental Pain   The history is provided by the patient. No language interpreter was used.   HPI Comments: Jean [name] is a 48 y.o. female who presents to the Emergency Department complaining of gradual, constant, upper L dental pain, with onset three days ago. She denies any abscess or trauma to the area. Patient had seen a dentist to remove a different tooth and was advised that she would need surgery for the tooth currently giving her discomfort;, however, that she would need to contact an oral surgeon. Following this appointment, patient did not f/u with an oral surgeon to have the procedure. Patient has taken tylenol, ibuprofen, and Vicodin with no relief. She denies any other medical symptoms. She is a former smoker and denies alcohol use.  Past Medical History  Diagnosis Date  . Herpes   . Asthma    Past Surgical History  Procedure Laterality Date  . Tubal ligation     No family history on file. History  Substance Use Topics  . Smoking status: Former Research scientist (life sciences)  . Smokeless tobacco: Not on file  . Alcohol Use: No   OB History   Grav Para Term Preterm Abortions TAB SAB Ect Mult Living                 Review of Systems  Constitutional: Negative for chills.  HENT: Positive for dental problem.   Respiratory: Negative for shortness of breath.   Gastrointestinal: Negative for nausea and vomiting.  Neurological: Negative for weakness.  All other systems reviewed and are negative.   Allergies  Review of patient's allergies indicates no known allergies.  Home Medications   Current Outpatient Rx  Name  Route  Sig  Dispense  Refill  . HYDROcodone-acetaminophen  (NORCO/VICODIN) 5-325 MG per tablet   Oral   Take 1 tablet by mouth every 4 (four) hours as needed for moderate pain.   8 tablet   0   . promethazine (PHENERGAN) 25 MG tablet   Oral   Take 1 tablet (25 mg total) by mouth every 6 (six) hours as needed for nausea or vomiting.   30 tablet   0    Triage Vitals: BP 123/66  Pulse 71  Temp(Src) 99 F (37.2 C) (Oral)  Resp 17  Ht 5\' 3"  (1.6 m)  Wt 130 lb (58.968 kg)  BMI 23.03 kg/m2  SpO2 100%  Physical Exam  Nursing note and vitals reviewed. Constitutional: She is oriented to person, place, and time. She appears well-developed and well-nourished. No distress.  HENT:  Head: Normocephalic and atraumatic.  Poor dentition. Broken off left upper first molar. Surrounding gum erythema and mild swelling. No obvious abscess. Tooth is tender to palpation. No trismus or swelling under the tongue.  Eyes: EOM are normal.  Neck: Neck supple. No tracheal deviation present.  Cardiovascular: Normal rate.   Pulmonary/Chest: Effort normal. No respiratory distress.  Musculoskeletal: Normal range of motion.  Neurological: She is alert and oriented to person, place, and time.  Skin: Skin is warm and dry.  Psychiatric: She has a normal mood and affect. Her behavior is normal.   ED Course  Procedures (including  critical care time)  DIAGNOSTIC STUDIES: Oxygen Saturation is 100% on RA, nml by my interpretation.    COORDINATION OF CARE: 10:50 AM-Will prescribe antibiotics and pain medication and encouraged patient to see an oral surgeon as soon as possible, before tooth getting worse. Will refer patient to an oral surgeon. Treatment plan discussed with patient and patient agrees.  Labs Review Labs Reviewed - No data to display Imaging Review No results found.  EKG Interpretation   None       MDM   1. Pain, dental     Patient with a toothache for several days. The broken off tooth is tender to palpation, there is mild surrounding gum  swelling, no obvious abscess. No trismus or swelling under the tongue. At this time I will start her on antibiotic for possible dental abscess. Home with followup with a dentist. Prescribed penicillin and Percocet for pain. She's already taking Norco with no.  Filed Vitals:   06/14/13 1004 06/14/13 1128  BP: 123/66 157/88  Pulse: 71 67  Temp: 99 F (37.2 C) 99.2 F (37.3 C)  TempSrc: Oral Oral  Resp: 17 16  Height: 5\' 3"  (1.6 m)   Weight: 130 lb (58.968 kg)   SpO2: 100% 100%   I personally performed the services described in this documentation, which was scribed in my presence. The recorded information has been reviewed and is accurate.     Renold Genta, PA-C 06/14/13 1736

## 2013-06-14 NOTE — Discharge Instructions (Signed)
Ibuprofen for pain. Oxycodone for severe pain. Penicillin for infection. Follow up with a dentist as referred.     Dental Pain A tooth ache may be caused by cavities (tooth decay). Cavities expose the nerve of the tooth to air and hot or cold temperatures. It may come from an infection or abscess (also called a boil or furuncle) around your tooth. It is also often caused by dental caries (tooth decay). This causes the pain you are having. DIAGNOSIS  Your caregiver can diagnose this problem by exam. TREATMENT   If caused by an infection, it may be treated with medications which kill germs (antibiotics) and pain medications as prescribed by your caregiver. Take medications as directed.  Only take over-the-counter or prescription medicines for pain, discomfort, or fever as directed by your caregiver.  Whether the tooth ache today is caused by infection or dental disease, you should see your dentist as soon as possible for further care. SEEK MEDICAL CARE IF: The exam and treatment you received today has been provided on an emergency basis only. This is not a substitute for complete medical or dental care. If your problem worsens or new problems (symptoms) appear, and you are unable to meet with your dentist, call or return to this location. SEEK IMMEDIATE MEDICAL CARE IF:   You have a fever.  You develop redness and swelling of your face, jaw, or neck.  You are unable to open your mouth.  You have severe pain uncontrolled by pain medicine. MAKE SURE YOU:   Understand these instructions.  Will watch your condition.  Will get help right away if you are not doing well or get worse. Document Released: 04/23/2005 Document Revised: 07/16/2011 Document Reviewed: 12/10/2007 [facility] Patient Information 2014 [place].

## 2013-06-14 NOTE — ED Notes (Signed)
Pt states shes had a toothache for past 3 days. Tried tylenol, ibuprofen and vicodin with no relief. She was supposed to get the tooth removed but never did

## 2013-06-16 NOTE — ED Provider Notes (Signed)
Medical screening examination/treatment/procedure(s) were performed by non-physician practitioner and as supervising physician I was immediately available for consultation/collaboration.    Kathalene Frames, MD 06/16/13 [phone]

## 2014-02-21 ENCOUNTER — Emergency Department (HOSPITAL_COMMUNITY): Payer: Self-pay

## 2014-02-21 ENCOUNTER — Emergency Department (HOSPITAL_COMMUNITY)
Admission: EM | Admit: 2014-02-21 | Discharge: 2014-02-21 | Disposition: A | Discharge status: Home / Self Care | Payer: Self-pay | Attending: Emergency Medicine | Admitting: Emergency Medicine

## 2014-02-21 ENCOUNTER — Encounter (HOSPITAL_COMMUNITY): Payer: Self-pay | Admitting: Emergency Medicine

## 2014-02-21 DIAGNOSIS — M25552 Pain in left hip: Secondary | ICD-10-CM | POA: Insufficient documentation

## 2014-02-21 DIAGNOSIS — G8929 Other chronic pain: Secondary | ICD-10-CM | POA: Insufficient documentation

## 2014-02-21 DIAGNOSIS — Z8619 Personal history of other infectious and parasitic diseases: Secondary | ICD-10-CM | POA: Insufficient documentation

## 2014-02-21 DIAGNOSIS — Z79899 Other long term (current) drug therapy: Secondary | ICD-10-CM | POA: Insufficient documentation

## 2014-02-21 DIAGNOSIS — Z791 Long term (current) use of non-steroidal anti-inflammatories (NSAID): Secondary | ICD-10-CM | POA: Insufficient documentation

## 2014-02-21 DIAGNOSIS — J45909 Unspecified asthma, uncomplicated: Secondary | ICD-10-CM | POA: Insufficient documentation

## 2014-02-21 DIAGNOSIS — Z87891 Personal history of nicotine dependence: Secondary | ICD-10-CM | POA: Insufficient documentation

## 2014-02-21 MED ORDER — IBUPROFEN 400 MG PO TABS
800.0000 mg | ORAL_TABLET | Freq: Once | ORAL | Status: AC
  Administered 2014-02-21: 800 mg via ORAL
  Filled 2014-02-21: qty 2

## 2014-02-21 MED ORDER — MELOXICAM 7.5 MG PO TABS: 7.5000 mg | ORAL_TABLET | Freq: Every day | ORAL | Status: DC

## 2014-02-21 NOTE — Discharge Instructions (Signed)
Read the information below.  Use the prescribed medication as directed.  Please discuss all new medications with your pharmacist.  You may return to the Emergency Department at any time for worsening condition or any new symptoms that concern you.  If you develop uncontrolled pain, weakness or numbness of the extremity, severe discoloration of the skin, or you are unable to walk, return to the ER for a recheck.   ° ° °Hip Pain °Your hip is the joint between your upper legs and your lower pelvis. The bones, cartilage, tendons, and muscles of your hip joint perform a lot of work each day supporting your body weight and allowing you to move around. °Hip pain can range from a minor ache to severe pain in one or both of your hips. Pain may be felt on the inside of the hip joint near the groin, or the outside near the buttocks and upper thigh. You may have swelling or stiffness as well.  °HOME CARE INSTRUCTIONS  °· Take medicines only as directed by your health care provider. °· Apply ice to the injured area: °¨ Put ice in a plastic bag. °¨ Place a towel between your skin and the bag. °¨ Leave the ice on for 15-20 minutes at a time, 3-4 times a day. °· Keep your leg raised (elevated) when possible to lessen swelling. °· Avoid activities that cause pain. °· Follow specific exercises as directed by your health care provider. °· Sleep with a pillow between your legs on your most comfortable side. °· Record how often you have hip pain, the location of the pain, and what it feels like. °SEEK MEDICAL CARE IF:  °· You are unable to put weight on your leg. °· Your hip is red or swollen or very tender to touch. °· Your pain or swelling continues or worsens after 1 week. °· You have increasing difficulty walking. °· You have a fever. °SEEK IMMEDIATE MEDICAL CARE IF:  °· You have fallen. °· You have a sudden increase in pain and swelling in your hip. °MAKE SURE YOU:  °· Understand these instructions. °· Will watch your  condition. °· Will get help right away if you are not doing well or get worse. °Document Released: 10/11/2009 Document Revised: 09/07/2013 Document Reviewed: 12/18/2012 °ExitCare® Patient Information ©2015 [employer]. This information is not intended to replace advice given to you by your health care provider. Make sure you discuss any questions you have with your health care provider. ° ° °Emergency Department Resource Guide °1) Find a Doctor and Pay Out of Pocket °Although you won't have to find out who is covered by your insurance plan, it is a good idea to ask around and get recommendations. You will then need to call the office and see if the doctor you have chosen will accept you as a new patient and what types of options they offer for patients who are self-pay. Some doctors offer discounts or will set up payment plans for their patients who do not have insurance, but you will need to ask so you aren't surprised when you get to your appointment. ° °2) Contact Your Local Health Department °Not all health departments have doctors that can see patients for sick visits, but many do, so it is worth a call to see if yours does. If you don't know where your local health department is, you can check in your phone book. The CDC also has a tool to help you locate your state's health department, and   many state websites also have listings of all of their local health departments. ° °3) Find a Walk-in Clinic °If your illness is not likely to be very severe or complicated, you may want to try a walk in clinic. These are popping up all over the country in pharmacies, drugstores, and shopping centers. They're usually staffed by nurse practitioners or physician assistants that have been trained to treat common illnesses and complaints. They're usually fairly quick and inexpensive. However, if you have serious medical issues or chronic medical problems, these are probably not your best option. ° °No Primary Care  Doctor: °- Call Health Connect at  [phone] - they can help you locate a primary care doctor that  accepts your insurance, provides certain services, etc. °- Physician Referral Service- 1-800-533-3463 ° °Chronic Pain Problems: °Organization         Address  Phone   Notes  °[facility]  ([phone] Patients need to be referred by their primary care doctor.  ° °Medication Assistance: °Organization         Address  Phone   Notes  °[place] [facility] [address] °[place], [place] [address] [phone]) [phone] --Must be a resident of [place] °-- Must have NO insurance coverage whatsoever (no Medicaid/ Medicare, etc.) °-- The pt. MUST have a primary care doctor that directs their care regularly and follows them in the community °  °[facility]  (866) 331-1348   °[employer]  (888) 892-1162   ° °Agencies that provide inexpensive medical care: °Organization         Address  Phone   Notes  °[facility]  ([phone]) [phone]   °[facility]    ([phone]) [phone]   °[facility] [address] °[place], [place] [address] (336) 832-4777   °[facility] [address], °[place] (336) [phone]   °Planned Parenthood    (336) 373-0678   °[facility]    (336) 272-1050   °[facility] ° [address], [place] Phone:  (336) 832-4444, Fax:  (336) [phone] Hours of Operation:  9 am - 6 pm, M-F.  Also accepts Medicaid/Medicare and self-pay.  °[facility] ° [address], [place] Phone: (336) [phone], Fax: ([phone]) [phone]. Hours of Operation:  8:30 am - 5:30 pm, M-F.  Also accepts Medicaid and self-pay.  °[facility] [place] [address], [place] Phone: (336) [phone]   °[facility] [address], [place],  (336)723-1848, Ext. 123 Mondays & Thursdays: 7-9 AM.  First 15 patients are seen on a first  come, first serve basis. °  ° °Medicaid-accepting [place] Providers: ° °Organization         Address  Phone   Notes  °[facility] [address], [place] ([phone]) [phone] Also accepts self-pay patients.  °[facility] [address], [place] ° (336) [phone]   °[facility] [address], [place] ([phone]) [phone]   °[facility] [address] [place] [address], [place] ([phone]) [phone]   °Veita Bland [address],   ° ([phone]) [phone] Only accepts [place] Access Medicaid patients after they have their name applied to their card.  ° °Self-Pay (no insurance) in [place]: ° °Organization           Address  Phone   Notes  Sickle Cell Patients, [facility] [place] [phone]   [facility] [place] [phone]   [facility] [place]  [address], [place] [phone]   [facility]/Dr. Osei-Bonsu  [address], [place] or [address], [place] [phone] Phone number for both [place] and [place] locations is the same.  [facility] [address] [place] [phone]   [facility] ([facility]) [address], [place] or [address] [phone]   [facility] [address] [place] ([phone])[phone], phone; [phone], fax Sees patients 1st and 3rd Saturday of every month.  Must not qualify for public or private insurance (i.e. Medicaid, Medicare, [place] Health Choice, Veterans' Benefits)  Household income should be no more than 200% of the poverty level The clinic cannot treat you if you are pregnant or think you are pregnant  Sexually transmitted diseases are not treated at the clinic.    Dental Care: Organization          Address  Phone  Notes  [facility] Department of [facility] [phone] Accepts children up to age 57 who are enrolled in [place] or [place]; pregnant women with a Medicaid card; and children who have applied for Medicaid or [place] Health Choice, but were declined, whose parents can pay a reduced fee at time of service.  [facility] Department of [facility]  [address], [place] (541)[phone] Accepts children up to age 67 who are enrolled in [place] or [place]; pregnant women with a Medicaid card; and children who have applied for Medicaid or [place] Health Choice, but were declined, whose parents can pay a reduced fee at time of service.  [facility]  [place] [phone] Patients are seen by appointment only. Walk-ins are not accepted. [place] will see patients 49 years of age and older. Monday - Tuesday (8am-5pm) Most Wednesdays (8:30-5pm) $30 per visit, cash only  [facility] Adult [facility] Access PRO[facility]  [address], [facility] [phone] Patients are seen by appointment only. Walk-ins are not accepted. [place] will see patients 38 years of age and older. One Wednesday Evening (Monthly: Volunteer Based).  $30 per visit, cash only  [place]  [phone] for adults; Children under age 59, call Graduate Pediatric Dentistry at [phone]. Children aged 77-14, please call [phone] to request a pediatric application.  Dental services are provided in all areas of dental care including fillings, crowns and bridges, complete and partial dentures, implants, gum treatment, root canals, and extractions. Preventive care is also provided. Treatment is provided to both adults and children. Patients are selected via a lottery and there is often a waiting list.   [facility] [address], [place] Tynasia [place]  [phone] [email or web address]   [facility] [address] [place], [place] [phone], Ext. 123 Second and Fourth Thursday of each month, opens at 6:30 AM; Clinic ends at 9 AM.  Patients are seen on a first-come first-served basis, and a limited number are seen during each clinic.   [facility]  [address] [place], [place]  [place], [place] [phone]   Eligibility Requirements You must have lived in [place], [place], or [place] counties for at least the last three months.   You cannot be eligible for state or federal sponsored [employer], including [employer], [place], or [employer].   You generally cannot be eligible for healthcare insurance through your employer.    How to apply: Eligibility screenings are held every Tuesday and Wednesday afternoon from 1:00 pm until 4:00 pm. You do not need an appointment for the interview!  [facility] [address], [place], [place]   [place] Department  [place]  [phone]    Behavioral Health Resources in the Community: Intensive Outpatient Programs Organization         Address  Phone  Notes  [place]. [address], [place], [place] [phone]   [facility] [address] [place], [place]   [facility]: [facility] [address] [place], [place]   [place] [address]  [place], [place] or [phone]   Substance Abuse Resources Organization         Address  Phone  Notes  Al[facility] Drug Services  [phone]   [place]  [phone]   [facility] [place]   Chinita Pester  (406)270-7342   Residential & Outpatient Substance Abuse Program  (613)650-9598   Psychological  Services Organization         Address  Phone  Notes  [facility] [place]  [place]  ([phone]   [place] [address] [place] or (641) [phone]    Mobile Crisis Teams Organization         Address  Phone  Notes  [facility], Mobile Crisis Care Unit  [phone]   [facility]  [address] [place], [place]   [place] [address], [place] [phone]    Self-Help/Support Groups Organization         Address  Phone             Notes  [place]. of [place] - variety of support groups  [place] Call for more information  Narcotics Anonymous (NA), [facility] [address], [employer] [place]  2 meetings at this location   Special educational needs teacher         Address  Phone  Notes  [facility] [place],    [place]  [phone]   [facility]  [address] [place] 846659, [place], [place]   [place], [place] [phone] Admissions: 8am-3pm M-F  Incentives Substance [place] [address],    [place], [place] [phone]   [facility] [address] Jadene Pierini [place], [place]   [facility] [address],  [place], [place]   [facility] - Intensive Outpatient [address], [place], [place]   [facility] (Centre[facility] [place].,  [place], [place] or ([phone]   [facility] ([facility]) [address], [place], [place] Accepts Medicaid  [facility] [place] [address],  [place] [phone] Substance Abuse/Addiction Treatment   [facility] Resources Organization  Address  Phone  Notes  [facility]  (760) [phone]   Domenic Schwab, PhD [address] [place], [place]   [phone] or [phone]   [place], [place] [phone]   [address], [place], [place] [phone] Insurance/Medicaid/sponsorship through [facility] and Families [address] [place]                                    [place], [place] [phone] [place] [address], [place] [phone]    Dr. Adele Schilder  [phone]   [facility] [place] Dept. 1) [address], [place] 2) [place] 3)  [place] [address], [place] [phone]  [phone]   [place] [phone] or [phone] (After Hours)
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
## 2014-02-21 NOTE — ED Provider Notes (Signed)
Medical screening examination/treatment/procedure(s) were performed by non-physician practitioner and as supervising physician I was immediately available for consultation/collaboration.   EKG Interpretation None        Blanchie Dessert, MD 02/21/14 1603
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
## 2015-06-22 ENCOUNTER — Emergency Department (HOSPITAL_COMMUNITY)
Admission: EM | Admit: 2015-06-22 | Discharge: 2015-06-22 | Disposition: A | Discharge status: Home / Self Care | Payer: Self-pay | Attending: Emergency Medicine | Admitting: Emergency Medicine

## 2015-06-22 ENCOUNTER — Encounter (HOSPITAL_COMMUNITY): Payer: Self-pay | Admitting: Emergency Medicine

## 2015-06-22 DIAGNOSIS — I10 Essential (primary) hypertension: Secondary | ICD-10-CM | POA: Insufficient documentation

## 2015-06-22 DIAGNOSIS — R1032 Left lower quadrant pain: Secondary | ICD-10-CM | POA: Insufficient documentation

## 2015-06-22 DIAGNOSIS — J45909 Unspecified asthma, uncomplicated: Secondary | ICD-10-CM | POA: Insufficient documentation

## 2015-06-22 LAB — URINALYSIS, ROUTINE W REFLEX MICROSCOPIC
Bilirubin Urine: NEGATIVE
GLUCOSE, UA: NEGATIVE mg/dL
Ketones, ur: NEGATIVE mg/dL
LEUKOCYTES UA: NEGATIVE
NITRITE: NEGATIVE
PH: 5.5 (ref 5.0–8.0)
Protein, ur: NEGATIVE mg/dL
SPECIFIC GRAVITY, URINE: 1.006 (ref 1.005–1.030)

## 2015-06-22 LAB — COMPREHENSIVE METABOLIC PANEL
ALBUMIN: 3.6 g/dL (ref 3.5–5.0)
ALT: 12 U/L — ABNORMAL LOW (ref 14–54)
ANION GAP: 12 (ref 5–15)
AST: 18 U/L (ref 15–41)
Alkaline Phosphatase: 80 U/L (ref 38–126)
BILIRUBIN TOTAL: 0.5 mg/dL (ref 0.3–1.2)
BUN: 6 mg/dL (ref 6–20)
CHLORIDE: 103 mmol/L (ref 101–111)
CO2: 20 mmol/L — ABNORMAL LOW (ref 22–32)
Calcium: 9 mg/dL (ref 8.9–10.3)
Creatinine, Ser: 1.23 mg/dL — ABNORMAL HIGH (ref 0.44–1.00)
GFR calc Af Amer: 57 mL/min — ABNORMAL LOW (ref >60)
GFR calc non Af Amer: 50 mL/min — ABNORMAL LOW (ref >60)
GLUCOSE: 115 mg/dL — AB (ref 65–99)
POTASSIUM: 3.9 mmol/L (ref 3.5–5.1)
SODIUM: 135 mmol/L (ref 135–145)
TOTAL PROTEIN: 6.9 g/dL (ref 6.5–8.1)

## 2015-06-22 LAB — URINE MICROSCOPIC-ADD ON

## 2015-06-22 LAB — CBC
HEMATOCRIT: 41.1 % (ref 36.0–46.0)
HEMOGLOBIN: 14.1 g/dL (ref 12.0–15.0)
MCH: 32.9 pg (ref 26.0–34.0)
MCHC: 34.3 g/dL (ref 30.0–36.0)
MCV: 96 fL (ref 78.0–100.0)
Platelets: 417 10*3/uL — ABNORMAL HIGH (ref 150–400)
RBC: 4.28 MIL/uL (ref 3.87–5.11)
RDW: 13.3 % (ref 11.5–15.5)
WBC: 9.3 10*3/uL (ref 4.0–10.5)

## 2015-06-22 LAB — LIPASE, BLOOD: LIPASE: 51 U/L (ref 11–51)

## 2015-06-22 LAB — POC URINE PREG, ED: Preg Test, Ur: NEGATIVE

## 2015-06-22 NOTE — ED Notes (Signed)
Pt. reports LLQ pain radiating to left hip onset 2 days ago , denies nausea or vomitting / no fever or diarrhea . Pt. is hypertensive at triage

## 2015-07-08 ENCOUNTER — Ambulatory Visit (INDEPENDENT_AMBULATORY_CARE_PROVIDER_SITE_OTHER): Payer: Self-pay | Admitting: Internal Medicine

## 2015-07-08 ENCOUNTER — Encounter: Payer: Self-pay | Admitting: Internal Medicine

## 2015-07-08 VITALS — BP 153/72 | HR 62 | Temp 98.3°F | Ht 63.0 in | Wt 137.8 lb

## 2015-07-08 DIAGNOSIS — Z23 Encounter for immunization: Secondary | ICD-10-CM

## 2015-07-08 DIAGNOSIS — Z0000 Encounter for general adult medical examination without abnormal findings: Secondary | ICD-10-CM

## 2015-07-08 DIAGNOSIS — Z720 Tobacco use: Secondary | ICD-10-CM

## 2015-07-08 DIAGNOSIS — M25552 Pain in left hip: Secondary | ICD-10-CM

## 2015-07-08 DIAGNOSIS — F17210 Nicotine dependence, cigarettes, uncomplicated: Secondary | ICD-10-CM

## 2015-07-08 DIAGNOSIS — G8929 Other chronic pain: Secondary | ICD-10-CM

## 2015-07-08 DIAGNOSIS — Z1211 Encounter for screening for malignant neoplasm of colon: Secondary | ICD-10-CM

## 2015-07-08 DIAGNOSIS — I10 Essential (primary) hypertension: Secondary | ICD-10-CM

## 2015-07-08 DIAGNOSIS — Z833 Family history of diabetes mellitus: Secondary | ICD-10-CM

## 2015-07-08 DIAGNOSIS — S73199A Other sprain of unspecified hip, initial encounter: Secondary | ICD-10-CM | POA: Insufficient documentation

## 2015-07-08 HISTORY — DX: Tobacco use: Z72.0

## 2015-07-08 LAB — GLUCOSE, CAPILLARY: Glucose-Capillary: 62 mg/dL — ABNORMAL LOW (ref 65–99)

## 2015-07-08 LAB — POCT GLYCOSYLATED HEMOGLOBIN (HGB A1C): Hemoglobin A1C: 5.3

## 2015-07-08 NOTE — Assessment & Plan Note (Signed)
BP remains elevated but improved from the ED visit. Filed Vitals:   07/08/15 0941  BP: 153/72  Pulse: 62  Temp: 98.3 F (36.8 C)   She had abnormal creatine in the ED, will recheck BMET today, if normal then will start HCTZ for HTN.  Also checking hgba1c and lipid panel for screening with her age and HTN.

## 2015-07-08 NOTE — Assessment & Plan Note (Signed)
Has lateral left hip pain, no medial pain. Likely 2/2 to overuse/trochanteric bursitis. Discussed icing/heat, stretching, tylenol PRN.

## 2015-07-08 NOTE — Progress Notes (Signed)
Subjective:    Patient ID: Jean [name], female    DOB: 10/09/1965, 50 y.o.   MRN: [id number]  HPI  50 yo female with hx of chronic hip pain, hypertension, presents to establish care as a new patient.   Patient has not seen a doctor since her childhood other than ED visits. She has chronic intermittent pain on her left hip that worsens with activities. No injuries that she knows of. Pain is mild. Doesn't have the pain currently. Denies any back pain or other pain currently. Not taking any meds for it now, took norco from the ED temporarily in the past.   Her BP was elevated in the ED to systolic [id number]. Denies any hx of HTN. Currently not taking any medication.   Denies any other complaints currently.  Eats lot of salty foods including chips and process/canned food. Doesn't do regular exercise. Smokes 1 pack per week since 2007. Single, has 1 daughter 5 year old, healthy. Sexually active with 1 stable partner. No hx of STD's. Works as a Psychologist, counselling.   FH hx: mother has DM II, HTN, and also breast cancer in her 73s. Father had 2 strokes (unknown age).    Past Medical History  Diagnosis Date  . Herpes    No current outpatient prescriptions on file.   No current facility-administered medications for this visit.   Family History  Problem Relation Age of Onset  . Cancer Mother   . Hypertension Mother   . Diabetes Mother   . Stroke Father    Social History   Social History  . Marital Status: Single    Spouse Name: N/A  . Number of Children: N/A  . Years of Education: N/A   Social History Main Topics  . Smoking status: Current Some Day Smoker -- 0.20 packs/day    Types: Cigarettes  . Smokeless tobacco: None  . Alcohol Use: 1.2 oz/week    2 Cans of beer per week     Comment: Beer.  . Drug Use: No  . Sexual Activity:    Partners: Male   Other Topics Concern  . None   Social History Narrative    Review of Systems  Constitutional: Negative for fever,  chills and fatigue.  HENT: Negative for congestion and sore throat.   Eyes: Negative for visual disturbance.  Respiratory: Negative for cough, chest tightness and shortness of breath.   Cardiovascular: Negative for chest pain, palpitations and leg swelling.  Gastrointestinal: Negative for nausea, vomiting, abdominal pain, blood in stool and abdominal distention.  Endocrine: Negative.   Genitourinary: Negative.   Musculoskeletal: Positive for arthralgias. Negative for back pain, joint swelling and gait problem.  Skin: Negative.   Neurological: Negative.   Hematological: Negative.   Psychiatric/Behavioral: Negative.        Objective:   Physical Exam  Constitutional: She is oriented to person, place, and time. She appears well-developed and well-nourished. No distress.  HENT:  Head: Normocephalic and atraumatic.  Eyes: Conjunctivae are normal. Pupils are equal, round, and reactive to light.  Neck: Normal range of motion.  Cardiovascular: Normal rate and regular rhythm.  Exam reveals no gallop and no friction rub.   No murmur heard. Pulmonary/Chest: Effort normal and breath sounds normal. No respiratory distress. She has no wheezes.  Abdominal: Soft. Bowel sounds are normal. She exhibits no distension. There is no tenderness.  Musculoskeletal: Normal range of motion. She exhibits no edema or tenderness.  Neurological: She is alert and oriented to person,  place, and time. No cranial nerve deficit.  Skin: Skin is warm. She is not diaphoretic.  Psychiatric: She has a normal mood and affect.      Filed Vitals:   07/08/15 0941  BP: 153/72  Pulse: 62  Temp: 98.3 F (36.8 C)       Assessment & Plan:  See problem based a&p.

## 2015-07-08 NOTE — Patient Instructions (Signed)
We will check some labs today, depending on that we will let you know if you need to be on medications.  Please schedule appt for pap smear with me in the future.  Please return the stool card at your convenience  Think about quitting smoking, let us know if we can help.

## 2015-07-08 NOTE — Assessment & Plan Note (Signed)
Mother has breast cancer. She never had mammogram. Will schedule mammogram when she has completed her process for the orange card.  Gave her flu shot today. Due for csope but without insurance we will start with stool card today. Will make appt for pap smear visit.

## 2015-07-08 NOTE — Assessment & Plan Note (Signed)
Provided counseling. Currently smoking 1pack/day since 2007. Not ready to quit yet.

## 2015-07-09 LAB — BASIC METABOLIC PANEL
BUN / CREAT RATIO: 13 (ref 9–23)
BUN: 15 mg/dL (ref 6–24)
CHLORIDE: 101 mmol/L (ref 96–106)
CO2: 21 mmol/L (ref 18–29)
Calcium: 10.1 mg/dL (ref 8.7–10.2)
Creatinine, Ser: 1.15 mg/dL — ABNORMAL HIGH (ref 0.57–1.00)
GFR, EST AFRICAN AMERICAN: 63 mL/min/{1.73_m2} (ref >59)
GFR, EST NON AFRICAN AMERICAN: 55 mL/min/{1.73_m2} — AB (ref >59)
Glucose: 67 mg/dL (ref 65–99)
POTASSIUM: 5 mmol/L (ref 3.5–5.2)
Sodium: 142 mmol/L (ref 134–144)

## 2015-07-09 LAB — LIPID PANEL
CHOLESTEROL TOTAL: 230 mg/dL — AB (ref 100–199)
Chol/HDL Ratio: 1.8 ratio units (ref 0.0–4.4)
HDL: 128 mg/dL (ref >39)
LDL CALC: 94 mg/dL (ref 0–99)
TRIGLYCERIDES: 40 mg/dL (ref 0–149)
VLDL Cholesterol Cal: 8 mg/dL (ref 5–40)

## 2015-07-11 NOTE — Progress Notes (Signed)
[facility] Medicine Clinic Attending  Case discussed with Dr. Rabbani soon after the resident saw the patient.  We reviewed the resident's history and exam and pertinent patient test results.  I agree with the assessment, diagnosis, and plan of care documented in the resident's note.  

## 2015-07-20 ENCOUNTER — Telehealth: Payer: Self-pay | Admitting: Internal Medicine

## 2015-07-20 NOTE — Telephone Encounter (Signed)
NEW BP MEDICATION WAS SENT TO WRONG PHARMACY, PLEASE SEND TO [facility] ON [address]

## 2015-07-21 NOTE — Telephone Encounter (Signed)
Dr Genene Churn, i see nothing on her med list???

## 2015-07-22 ENCOUNTER — Other Ambulatory Visit: Payer: Self-pay | Admitting: Internal Medicine

## 2015-07-22 DIAGNOSIS — I10 Essential (primary) hypertension: Secondary | ICD-10-CM

## 2015-07-22 MED ORDER — HYDROCHLOROTHIAZIDE 12.5 MG PO CAPS: 12.5000 mg | ORAL_CAPSULE | Freq: Every day | ORAL | Status: DC

## 2015-07-22 NOTE — Telephone Encounter (Signed)
I am sorry I must have missed this somewhat after lab results came back. It should be there now. HCTZ 12.5mg  daily.

## 2015-08-11 ENCOUNTER — Ambulatory Visit (HOSPITAL_COMMUNITY)
Admission: EM | Admit: 2015-08-11 | Discharge: 2015-08-11 | Disposition: A | Discharge status: Home / Self Care | Payer: Self-pay | Attending: Emergency Medicine | Admitting: Emergency Medicine

## 2015-08-11 ENCOUNTER — Encounter (HOSPITAL_COMMUNITY): Payer: Self-pay | Admitting: Emergency Medicine

## 2015-08-11 DIAGNOSIS — R51 Headache: Secondary | ICD-10-CM

## 2015-08-11 DIAGNOSIS — R011 Cardiac murmur, unspecified: Secondary | ICD-10-CM

## 2015-08-11 DIAGNOSIS — R519 Headache, unspecified: Secondary | ICD-10-CM

## 2015-08-11 DIAGNOSIS — H9201 Otalgia, right ear: Secondary | ICD-10-CM

## 2015-08-11 MED ORDER — KETOROLAC TROMETHAMINE 60 MG/2ML IM SOLN
60.0000 mg | Freq: Once | INTRAMUSCULAR | Status: AC
  Administered 2015-08-11: 60 mg via INTRAMUSCULAR

## 2015-08-11 MED ORDER — IBUPROFEN 600 MG PO TABS: 600.0000 mg | ORAL_TABLET | Freq: Four times a day (QID) | ORAL | Status: DC | PRN

## 2015-08-11 MED ORDER — KETOROLAC TROMETHAMINE 60 MG/2ML IM SOLN
INTRAMUSCULAR | Status: AC
  Filled 2015-08-11: qty 2

## 2015-08-11 NOTE — ED Notes (Signed)
Pt reports pain in her right ear starting Sunday.  It now radiates to her right jaw and the whole right side of her face.  She has tried OTC NSAIDS at home with no relief.  Pt has sensitivity to light because of the pain.

## 2015-08-11 NOTE — Discharge Instructions (Signed)
We gave you a shot of medicine to help with your headache. I do not see any sign of infection in your ear. Please call your doctor for an appointment as soon as possible for follow-up. If the headache is getting worse or you have repeat episodes of chest pain, please go to the emergency room.

## 2015-08-11 NOTE — ED Provider Notes (Signed)
CSN: [id number]     Arrival date & time 08/11/15  1436 History   First MD Initiated Contact with Patient 08/11/15 1503     Chief Complaint  Patient presents with  . Facial Pain   (Consider location/radiation/quality/duration/timing/severity/associated sxs/prior Treatment) HPI  She is a 50 year old woman here for evaluation of right-sided facial pain. She states this started 4 days ago with a right earache. It has gradually been getting worse and now involves the entire right side of her face and her right teeth. She reports some sensitivity to light. Denies any changes in her vision. She did have one episode of vomiting yesterday but she attributes this to eating some bad food. She does have a history of migraines, but states this is different. She does report some muffled hearing in the right ear. She also has a history of dental issues, but denies any specific dental pain.  She has tried over-the-counter pain medications without improvement. She states she has been clenching her jaw to try to help relieve the pain, but does not help.  She does also report an episode of palpitations with pain in the left anterior chest last night. She denies any current pain.  Past Medical History  Diagnosis Date  . Herpes    Past Surgical History  Procedure Laterality Date  . Tubal ligation     Family History  Problem Relation Age of Onset  . Cancer Mother   . Hypertension Mother   . Diabetes Mother   . Stroke Father    Social History  Substance Use Topics  . Smoking status: Current Some Day Smoker -- 0.20 packs/day    Types: Cigarettes  . Smokeless tobacco: None  . Alcohol Use: 1.2 oz/week    2 Cans of beer per week     Comment: Beer.   OB History    No data available     Review of Systems As in history of present illness Allergies  Review of patient's allergies indicates no known allergies.  Home Medications   Prior to Admission medications   Medication Sig Start Date End Date  Taking? Authorizing Provider  hydrochlorothiazide (MICROZIDE) 12.5 MG capsule Take 1 capsule (12.5 mg total) by mouth daily. 07/22/15 07/21/16 Yes Tasrif Ahmed, MD  ibuprofen (ADVIL,MOTRIN) 600 MG tablet Take 1 tablet (600 mg total) by mouth every 6 (six) hours as needed for moderate pain. 08/11/15   Melony Overly, MD   Meds Ordered and Administered this Visit   Medications  ketorolac (TORADOL) injection 60 mg (60 mg Intramuscular Given 08/11/15 1555)    BP 156/91 mmHg  Pulse 60  Temp(Src) 98.9 F (37.2 C) (Oral)  Resp 16  SpO2 99%  LMP 07/26/2015 (Exact Date) No data found.   Physical Exam  Constitutional: She is oriented to person, place, and time. She appears well-developed and well-nourished. She appears distressed (lying in darkened exam room.).  HENT:  Head: Normocephalic and atraumatic.  Mouth/Throat: Oropharynx is clear and moist. Abnormal dentition. No oropharyngeal exudate.  General poor dentition, but no dental tenderness or gingival erythema or swelling.  Eyes: Conjunctivae and EOM are normal. Pupils are equal, round, and reactive to light.  Neck: Neck supple.  Cardiovascular: Normal rate and regular rhythm.   Murmur (2/6 systolic ejection murmur at right intercostal border) heard. Pulmonary/Chest: Effort normal and breath sounds normal. No respiratory distress. She has no wheezes. She has no rales.  Lymphadenopathy:    She has no cervical adenopathy.  Neurological: She is alert and  oriented to person, place, and time.    ED Course  Procedures (including critical care time) ED ECG REPORT   Date: 08/11/2015  Rate: 46  Rhythm: sinus bradycardia  QRS Axis: normal  Intervals: normal  ST/T Wave abnormalities: normal  Conduction Disutrbances:none  Narrative Interpretation: sinus bradycardia with poor r-wave progression  Old EKG Reviewed: unchanged  I have personally reviewed the EKG tracing and agree with the computerized printout as noted.  Labs Review Labs  Reviewed - No data to display  Imaging Review No results found.    MDM   1. Headache, unspecified headache type   2. Otalgia of right ear   3. Heart murmur    Toradol given for headache. No sign of ear infection or ear injury. No sign of dental infection on exam. Recommended follow-up with PCP tomorrow for recheck of headache and heart murmur. Reasons to go to the ER reviewed.    Melony Overly, MD 08/11/15 [phone]

## 2015-08-15 ENCOUNTER — Other Ambulatory Visit (INDEPENDENT_AMBULATORY_CARE_PROVIDER_SITE_OTHER): Payer: Self-pay

## 2015-08-15 DIAGNOSIS — Z1211 Encounter for screening for malignant neoplasm of colon: Secondary | ICD-10-CM

## 2015-08-15 LAB — POC HEMOCCULT BLD/STL (HOME/3-CARD/SCREEN)
FECAL OCCULT BLD: NEGATIVE
FECAL OCCULT BLD: NEGATIVE
Fecal Occult Blood, POC: NEGATIVE

## 2015-08-15 NOTE — Addendum Note (Signed)
Addended by: Orson Gear on: 08/15/2015 01:39 PM   Modules accepted: Orders

## 2015-08-22 ENCOUNTER — Ambulatory Visit (INDEPENDENT_AMBULATORY_CARE_PROVIDER_SITE_OTHER): Payer: Self-pay | Admitting: Pulmonary Disease

## 2015-08-22 ENCOUNTER — Encounter: Payer: Self-pay | Admitting: Pulmonary Disease

## 2015-08-22 VITALS — BP 135/73 | HR 57 | Temp 98.3°F | Resp 18 | Ht 63.0 in | Wt 138.5 lb

## 2015-08-22 DIAGNOSIS — M25552 Pain in left hip: Secondary | ICD-10-CM

## 2015-08-22 DIAGNOSIS — R001 Bradycardia, unspecified: Secondary | ICD-10-CM

## 2015-08-22 DIAGNOSIS — Z114 Encounter for screening for human immunodeficiency virus [HIV]: Secondary | ICD-10-CM

## 2015-08-22 DIAGNOSIS — R5383 Other fatigue: Secondary | ICD-10-CM

## 2015-08-22 DIAGNOSIS — R011 Cardiac murmur, unspecified: Secondary | ICD-10-CM

## 2015-08-22 DIAGNOSIS — R002 Palpitations: Secondary | ICD-10-CM

## 2015-08-22 DIAGNOSIS — Z7289 Other problems related to lifestyle: Secondary | ICD-10-CM

## 2015-08-22 DIAGNOSIS — G8929 Other chronic pain: Secondary | ICD-10-CM

## 2015-08-22 DIAGNOSIS — R0609 Other forms of dyspnea: Secondary | ICD-10-CM | POA: Insufficient documentation

## 2015-08-22 NOTE — Assessment & Plan Note (Addendum)
Assessment: She has a 3/6 systolic ejection murmur heard best in the right upper sternal border. She also has intermittent palpitations, fatigue. EKG was sinus bradycardia.  Plan: Check TSH Obtain Echo If testing unremarkable, consider PFTs given she is a smoker.  ADDENDUM: she did not stop by lab for blood work. Please obtain TSH at follow up.

## 2015-08-22 NOTE — Progress Notes (Signed)
Case discussed with Dr. Krall soon after the resident saw the patient.  We reviewed the resident's history and exam and pertinent patient test results.  I agree with the assessment, diagnosis, and plan of care documented in the resident's note. 

## 2015-08-22 NOTE — Progress Notes (Signed)
   Subjective:    Patient ID: [name], female    DOB: 10-24-1965, 50 y.o.   MRN: [id number]  HPI Ms. WILLOW BLACKETER is a 50 year old woman with hypertension here for follow-up of urgent care visit for headache.  She was seen in urgent care on 08/11/2015. At that time she had right-sided facial pain and right otalgia. She received Toradol.  She reports the headache and otalgia have improved and largely resolved.  She reports that she is still having intermittent palpitations. EKG on 08/11/2015 demonstrated sinus bradycardia. She has fatigue. She has dyspnea on exertion. She became dyspneic from walking from the elevator to the waiting room. This has been going on for years but it has been worsening over time.  Review of Systems Constitutional: no fevers/chills Respiratory: no shortness of breath Cardiovascular: no chest pain Gastrointestinal: no nausea/vomiting  Past Medical History  Diagnosis Date  . Herpes   . Hypertension     Current Outpatient Prescriptions on File Prior to Visit  Medication Sig Dispense Refill  . hydrochlorothiazide (MICROZIDE) 12.5 MG capsule Take 1 capsule (12.5 mg total) by mouth daily. 30 capsule 3  . ibuprofen (ADVIL,MOTRIN) 600 MG tablet Take 1 tablet (600 mg total) by mouth every 6 (six) hours as needed for moderate pain. 30 tablet 0   No current facility-administered medications on file prior to visit.   Objective:   Physical Exam Blood pressure 135/73, pulse 57, temperature 98.3 F (36.8 C), temperature source Oral, resp. rate 18, height 5\' 3"  (1.6 m), weight 138 lb 8 oz (62.823 kg), last menstrual period 07/26/2015, SpO2 100 %. General Apperance: NAD HEENT: Normocephalic, atraumatic, anicteric sclera Neck: Supple, trachea midline Lungs: Clear to auscultation bilaterally. No wheezes, rhonchi or rales. Breathing comfortably Heart: Regular rate and rhythm, 3/6 systolic ejection murmur. Abdomen: Soft, nontender, nondistended, no  rebound/guarding Extremities: Warm and well perfused, no edema. L lateral hip tender to palpation. Skin: No rashes or lesions Neurologic: Alert and interactive. No gross deficits.  Assessment & Plan:  Please refer to problem based charting.

## 2015-08-22 NOTE — Assessment & Plan Note (Signed)
Assessment: Lateral hip pain felt to be related to trochanteric bursitis. She reports ice/heat, Tylenol do not help.  Plan: Referral to sports medicine.

## 2015-08-31 ENCOUNTER — Telehealth: Payer: Self-pay | Admitting: Internal Medicine

## 2015-08-31 NOTE — Telephone Encounter (Signed)
Appt. Reminder call, [email or web address]

## 2015-09-01 ENCOUNTER — Ambulatory Visit: Payer: Self-pay

## 2015-09-01 NOTE — Addendum Note (Signed)
Addended by: Jacques Earthly T on: 09/01/2015 11:33 AM   Modules accepted: Orders

## 2015-09-05 ENCOUNTER — Ambulatory Visit: Payer: Self-pay

## 2015-09-07 ENCOUNTER — Ambulatory Visit
Admission: RE | Admit: 2015-09-07 | Discharge: 2015-09-07 | Disposition: A | Discharge status: Home / Self Care | Payer: No Typology Code available for payment source | Source: Ambulatory Visit | Attending: Sports Medicine | Admitting: Sports Medicine

## 2015-09-07 ENCOUNTER — Ambulatory Visit (INDEPENDENT_AMBULATORY_CARE_PROVIDER_SITE_OTHER): Payer: Self-pay | Admitting: Sports Medicine

## 2015-09-07 VITALS — BP 130/57

## 2015-09-07 DIAGNOSIS — M25552 Pain in left hip: Secondary | ICD-10-CM

## 2015-09-07 MED ORDER — CAPSAICIN-MENTHOL-METHYL SAL 0.025-1-12 % EX CREA: TOPICAL_CREAM | CUTANEOUS | Status: DC
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
## 2015-09-07 NOTE — Patient Instructions (Signed)
Apply the cream twice daily to your left hip- it will be cool/burn initially but should improve. BE SURE TO [facility] YOUR HANDS AFTER APPLICATION, DO NOT GET THIS IN YOUR EYES We will call you with the results of your x-ray.  Capsaicin topical cream, lotion What is this medicine? CAPSAICIN (cap SAY sin) is a pain reliever. It is used to treat pain in muscles or joints. This medicine may be used for other purposes; ask your health care provider or pharmacist if you have questions. What should I tell my health care provider before I take this medicine? They need to know if you have any of these conditions: -broken or irritated skin -an unusual or allergic reaction to capsaicin, hot peppers, other medicines, foods, dyes, or preservatives -pregnant or trying to get pregnant -breast-feeding How should I use this medicine? Use this medicine on the skin. Do not take by mouth. Follow the directions on the package or prescription label. Rub into painful area until there is little or no visible medicine left on the skin surface. Wash hands with soap and water after applying. If you are using this medicine for pain in the hands, do not wash your hands for at least 30 minutes after using this medicine. After using this medicine do not use a bandage, a heating pad, or expose the affected area to direct sun. Use your medicine at regular intervals. Do not use your medicine more often than directed. Talk to your pediatrician regarding the use of this medicine in children. Special care may be needed. Overdosage: If you think you have taken too much of this medicine contact a poison control center or emergency room at once. NOTE: This medicine is only for you. Do not share this medicine with others. What if I miss a dose? If you miss a dose, use it as soon as you can. If it is almost time for your next dose, use only that dose. Do not use double or extra doses. What may interact with this medicine? Interactions are  not expected. Do not use any other skin products on the affected area without asking your doctor or health care professional. This list may not describe all possible interactions. Give your health care provider a list of all the medicines, herbs, non-prescription drugs, or dietary supplements you use. Also tell them if you smoke, drink alcohol, or use illegal drugs. Some items may interact with your medicine. What should I watch for while using this medicine? Tell your doctor or healthcare professional if your symptoms do not start to get better or if they get worse. What side effects may I notice from receiving this medicine? Side effects that you should report to your doctor or health care professional as soon as possible: -allergic reactions like skin rash, itching or hives, swelling of the face, lips, or tongue -burning pain, redness that does not go away -cough -skin sores or thinning Side effects that usually do not require medical attention (report to your doctor or health care professional if they continue or are bothersome): -mild stinging or warmth where used This list may not describe all possible side effects. Call your doctor for medical advice about side effects. You may report side effects to FDA at 1-800-FDA-1088. Where should I keep my medicine? Keep out of the reach of young children. Store at room temperature, between 15 and 30 degrees C (59 and 86 degrees F). Do not freeze. Protect from light. Throw away any unused medicine after the expiration date.  NOTE: This sheet is a summary. It may not cover all possible information. If you have questions about this medicine, talk to your doctor, pharmacist, or health care provider.    2016, Elsevier/Gold Standard. (2011-04-09 16:38:28)

## 2015-09-09 ENCOUNTER — Other Ambulatory Visit: Payer: Self-pay

## 2015-09-09 ENCOUNTER — Ambulatory Visit (HOSPITAL_COMMUNITY): Payer: Self-pay | Attending: Cardiology

## 2015-09-09 DIAGNOSIS — R0609 Other forms of dyspnea: Secondary | ICD-10-CM | POA: Insufficient documentation

## 2015-09-09 DIAGNOSIS — I071 Rheumatic tricuspid insufficiency: Secondary | ICD-10-CM | POA: Insufficient documentation

## 2015-09-09 DIAGNOSIS — I119 Hypertensive heart disease without heart failure: Secondary | ICD-10-CM | POA: Insufficient documentation

## 2015-09-09 DIAGNOSIS — I340 Nonrheumatic mitral (valve) insufficiency: Secondary | ICD-10-CM | POA: Insufficient documentation

## 2015-09-09 DIAGNOSIS — Z720 Tobacco use: Secondary | ICD-10-CM | POA: Insufficient documentation

## 2015-09-12 ENCOUNTER — Telehealth: Payer: Self-pay | Admitting: Internal Medicine

## 2015-09-12 NOTE — Telephone Encounter (Signed)
APT. REMINDER CALL, NO ANSWER, NO VOICEMAIL °

## 2015-09-13 ENCOUNTER — Ambulatory Visit: Payer: Self-pay

## 2015-09-21 ENCOUNTER — Ambulatory Visit: Payer: Self-pay

## 2015-10-12 ENCOUNTER — Emergency Department (HOSPITAL_COMMUNITY)
Admission: EM | Admit: 2015-10-12 | Discharge: 2015-10-12 | Disposition: A | Discharge status: Home / Self Care | Payer: Self-pay | Attending: Emergency Medicine | Admitting: Emergency Medicine

## 2015-10-12 ENCOUNTER — Emergency Department (HOSPITAL_COMMUNITY): Payer: Self-pay

## 2015-10-12 ENCOUNTER — Encounter (HOSPITAL_COMMUNITY): Payer: Self-pay | Admitting: Emergency Medicine

## 2015-10-12 DIAGNOSIS — R590 Localized enlarged lymph nodes: Secondary | ICD-10-CM | POA: Insufficient documentation

## 2015-10-12 DIAGNOSIS — J209 Acute bronchitis, unspecified: Secondary | ICD-10-CM | POA: Insufficient documentation

## 2015-10-12 DIAGNOSIS — F17210 Nicotine dependence, cigarettes, uncomplicated: Secondary | ICD-10-CM | POA: Insufficient documentation

## 2015-10-12 DIAGNOSIS — R059 Cough, unspecified: Secondary | ICD-10-CM

## 2015-10-12 DIAGNOSIS — Z792 Long term (current) use of antibiotics: Secondary | ICD-10-CM | POA: Insufficient documentation

## 2015-10-12 DIAGNOSIS — R509 Fever, unspecified: Secondary | ICD-10-CM

## 2015-10-12 DIAGNOSIS — Z79899 Other long term (current) drug therapy: Secondary | ICD-10-CM | POA: Insufficient documentation

## 2015-10-12 DIAGNOSIS — R05 Cough: Secondary | ICD-10-CM

## 2015-10-12 DIAGNOSIS — I10 Essential (primary) hypertension: Secondary | ICD-10-CM | POA: Insufficient documentation

## 2015-10-12 DIAGNOSIS — J029 Acute pharyngitis, unspecified: Secondary | ICD-10-CM | POA: Insufficient documentation

## 2015-10-12 DIAGNOSIS — J40 Bronchitis, not specified as acute or chronic: Secondary | ICD-10-CM

## 2015-10-12 LAB — RAPID STREP SCREEN (MED CTR MEBANE ONLY): Streptococcus, Group A Screen (Direct): NEGATIVE

## 2015-10-12 MED ORDER — IPRATROPIUM-ALBUTEROL 0.5-2.5 (3) MG/3ML IN SOLN
3.0000 mL | Freq: Once | RESPIRATORY_TRACT | Status: AC
  Administered 2015-10-12: 3 mL via RESPIRATORY_TRACT
  Filled 2015-10-12: qty 3

## 2015-10-12 MED ORDER — AZITHROMYCIN 250 MG PO TABS: 250.0000 mg | ORAL_TABLET | Freq: Every day | ORAL | Status: DC

## 2015-10-12 MED ORDER — GUAIFENESIN-CODEINE 100-10 MG/5ML PO SYRP: 5.0000 mL | ORAL_SOLUTION | Freq: Three times a day (TID) | ORAL | Status: DC | PRN

## 2015-10-12 MED ORDER — ALBUTEROL SULFATE HFA 108 (90 BASE) MCG/ACT IN AERS
2.0000 | INHALATION_SPRAY | RESPIRATORY_TRACT | Status: DC | PRN
  Administered 2015-10-12: 2 via RESPIRATORY_TRACT
  Filled 2015-10-12: qty 6.7

## 2015-10-12 NOTE — ED Notes (Signed)
Pt states has a sore throat, fever and cold symptoms for 4 days, cough occasional productive, throat red/swollen

## 2015-10-12 NOTE — ED Notes (Signed)
Pt stable, ambulatory, states understanding of discharge instructions 

## 2015-10-12 NOTE — ED Provider Notes (Signed)
CSN: [id number]     Arrival date & time 10/12/15  1226 History  By signing my name below, I, Reola Mosher, attest that this documentation has been prepared under the direction and in the presence of [facility], [place].   Electronically Signed: Reola Mosher, ED Scribe. 10/12/2015. 1:40 PM.   Chief Complaint  Patient presents with  . Sore Throat  . Fever  . URI   Patient is a 50 y.o. female presenting with pharyngitis and fever. The history is provided by the patient. No language interpreter was used.  Sore Throat This is a new problem. The current episode started more than 2 days ago. The problem occurs constantly. The problem has been gradually worsening. Associated symptoms include headaches. Nothing aggravates the symptoms. Nothing relieves the symptoms. Treatments tried: Alka-seltzer. The treatment provided mild relief.  Fever Max temp prior to arrival:  101 Temp source:  Oral Onset quality:  Gradual Duration:  4 days Timing:  Intermittent Progression:  Resolved Chronicity:  New Relieved by:  Aspirin and ibuprofen Worsened by:  Nothing tried Ineffective treatments:  None tried Associated symptoms: cough (productive), headaches and sore throat    HPI Comments: Jean [name] is a 50 y.o. female with a PMHx of HTN who presents to the Emergency Department complaining of gradual onset, gradually worsening persistent, productive cough with white and yellow sputum onset 4 days ago. Pt has associated sore throat, cervical adenopathy, and fever (Tmax 101). She also states that she has had a right-sided, temporal HA for the last month, and has been seen for it with no specific diagnosis. She has tried taking daytime and nighttime Alka-seltzer, and OTC Tylenol with mild, temporary relief of her symptoms. Pt denies sinus pressure.   [facility], unknown provider.   Past Medical History  Diagnosis Date  . Herpes   . Hypertension    Past Surgical  History  Procedure Laterality Date  . Tubal ligation     Family History  Problem Relation Age of Onset  . Cancer Mother   . Hypertension Mother   . Diabetes Mother   . Stroke Father    Social History  Substance Use Topics  . Smoking status: Current Some Day Smoker -- 0.20 packs/day    Types: Cigarettes  . Smokeless tobacco: None  . Alcohol Use: 1.2 oz/week    2 Cans of beer per week     Comment: Beer.   OB History    No data available     Review of Systems  Constitutional: Positive for fever (Tmax 101).  HENT: Positive for sore throat. Negative for sinus pressure.   Respiratory: Positive for cough (productive).   Neurological: Positive for headaches.  Hematological: Positive for adenopathy (cervical).  All other systems reviewed and are negative.  Allergies  Review of patient's allergies indicates no known allergies.  Home Medications   Prior to Admission medications   Medication Sig Start Date End Date Taking? Authorizing Provider  azithromycin (ZITHROMAX) 250 MG tablet Take 1 tablet (250 mg total) by mouth daily. Take first 2 tablets together, then 1 every day until finished. 10/12/15   Maybell Misenheimer Bunnie Pion, NP  Capsaicin-Menthol-Methyl Sal (CAPSAICIN-METHYL SAL-MENTHOL) 0.025-1-12 % CREA Apply to left hip twice daily, be sure to wash hands after application [id number]   Archie Patten, MD  guaiFENesin-codeine [facility]) 100-10 MG/5ML syrup Take 5 mLs by mouth 3 (three) times daily as needed for cough. 10/12/15   Keyle Doby Bunnie Pion, NP  hydrochlorothiazide (MICROZIDE) 12.5 MG capsule Take 1 capsule (12.5 mg total) by mouth daily. 07/22/15 07/21/16  Tasrif Ahmed, MD  ibuprofen (ADVIL,MOTRIN) 600 MG tablet Take 1 tablet (600 mg total) by mouth every 6 (six) hours as needed for moderate pain. 08/11/15   Melony Overly, MD   BP 155/78 mmHg  Pulse 64  Temp(Src) 98.5 F (36.9 C) (Oral)  Resp 20  Wt 63.186 kg  SpO2 98%   Physical Exam  Constitutional: She appears well-developed and  well-nourished.  HENT:  Head: Normocephalic.  Right Ear: Hearing, tympanic membrane and external ear normal.  Left Ear: Hearing, tympanic membrane and external ear normal.  Mouth/Throat: Uvula is midline.  Mild erythema, no edema.  Eyes: Conjunctivae and EOM are normal. Pupils are equal, round, and reactive to light. No scleral icterus.  Neck: Trachea normal and normal range of motion. Neck supple. No spinous process tenderness present.  Cardiovascular: Normal rate and regular rhythm.   No murmur heard. Pulmonary/Chest: Effort normal. No respiratory distress. She has wheezes (Expiratory). She has no rales. She exhibits tenderness.  Abdominal: Soft. She exhibits no distension. There is no tenderness.  Musculoskeletal: Normal range of motion.  Lymphadenopathy:    She has cervical adenopathy.  Neurological: She is alert.  Skin: Skin is warm and dry.  Psychiatric: She has a normal mood and affect. Her behavior is normal.  Nursing note and vitals reviewed.  ED Course  Procedures (including critical care time)  DIAGNOSTIC STUDIES: Oxygen Saturation is 96% on RA, normal by my interpretation.   COORDINATION OF CARE: 1:36 PM-Discussed next steps with pt including CXR and a breathing treatment. Pt verbalized understanding and is agreeable with the plan.   After neb treatment patient's air movement has improved.  Labs Review Labs Reviewed  RAPID STREP SCREEN (NOT AT [facility])  CULTURE, GROUP A STREP [facility])   Imaging Review Dg Chest 2 View  10/12/2015  CLINICAL DATA:  50 year old female with productive cough fever chills shortness of breath for 4 days. Smoker. Initial encounter. EXAM: CHEST  2 VIEW COMPARISON:  05/20/2012 and earlier. FINDINGS: Lower lung volumes with mild crowding of markings at the lung bases. No consolidation or confluent pulmonary opacity. No pleural effusion. No pneumothorax. Mediastinal contours appear stable allowing for the normal lung volumes, and normal aside from  mild chronic tortuosity of the thoracic aorta. Visualized tracheal air column is within normal limits. On the lateral view a nodular density projecting over the spine at T8-T9 is felt related to bulky right lateral endplate osteophytosis on the frontal view (arrows). No acute osseous abnormality identified. IMPRESSION: 1. A 15 mm nodular density projecting over the spine on the lateral view is felt to be bony artifact due to endplate osteophytosis. Follow-up nonemergent chest CT (noncontrast would suffice) would confirm. 2. Lower lung volumes with otherwise no acute cardiopulmonary abnormality. Electronically Signed   By: Genevie Ann M.D.   On: 10/12/2015 14:23    I have personally reviewed and evaluated the lab results as part of my medical decision-making.  MDM  50 y.o. female with cough and wheezing stable for d/c without shortness of breath and O2 SAT 98% on R/A at D/C. Discussed with the patient all results and plan of care for f/u. All questioned fully answered. She will f/u with her PCP and have them review the CXR and decide if further imaging is indicated.   Final diagnoses:  Bronchitis   I personally performed the services described in this documentation, which was scribed  in my presence. The recorded information has been reviewed and is accurate.   [address] [place], [place] 10/12/15 2152  Veryl Speak, MD 10/13/15 [phone]

## 2015-10-12 NOTE — Discharge Instructions (Signed)
Call your doctor to schedule a follow up appointment.

## 2015-10-14 LAB — CULTURE, GROUP A STREP (THRC)

## 2015-10-19 ENCOUNTER — Ambulatory Visit (INDEPENDENT_AMBULATORY_CARE_PROVIDER_SITE_OTHER): Payer: Self-pay | Admitting: Internal Medicine

## 2015-10-19 VITALS — BP 156/68 | HR 46 | Temp 97.7°F | Ht 63.0 in | Wt 137.8 lb

## 2015-10-19 DIAGNOSIS — G8929 Other chronic pain: Secondary | ICD-10-CM

## 2015-10-19 DIAGNOSIS — R918 Other nonspecific abnormal finding of lung field: Secondary | ICD-10-CM

## 2015-10-19 DIAGNOSIS — F4321 Adjustment disorder with depressed mood: Secondary | ICD-10-CM

## 2015-10-19 DIAGNOSIS — R9389 Abnormal findings on diagnostic imaging of other specified body structures: Secondary | ICD-10-CM

## 2015-10-19 DIAGNOSIS — J209 Acute bronchitis, unspecified: Secondary | ICD-10-CM

## 2015-10-19 DIAGNOSIS — F329 Major depressive disorder, single episode, unspecified: Secondary | ICD-10-CM | POA: Insufficient documentation

## 2015-10-19 DIAGNOSIS — M25552 Pain in left hip: Secondary | ICD-10-CM

## 2015-10-19 DIAGNOSIS — I10 Essential (primary) hypertension: Secondary | ICD-10-CM

## 2015-10-19 DIAGNOSIS — M25559 Pain in unspecified hip: Secondary | ICD-10-CM

## 2015-10-19 MED ORDER — GUAIFENESIN-CODEINE 100-10 MG/5ML PO SYRP: 5.0000 mL | ORAL_SOLUTION | Freq: Three times a day (TID) | ORAL | Status: DC | PRN

## 2015-10-19 MED ORDER — HYDROCHLOROTHIAZIDE 12.5 MG PO TABS: 12.5000 mg | ORAL_TABLET | Freq: Every day | ORAL | Status: DC

## 2015-10-19 MED FILL — HYDROCHLOROTHIAZIDE 12.5 MG: 12.5 | 30 days supply | Qty: 30 | Fill #0

## 2015-10-19 NOTE — Assessment & Plan Note (Signed)
CXR in ED on 10/12/15  was normal except for showing 15 mm nodular density over the spine thought to be artifact due to endpalte osteophytosis. rec follow up chest CT to confirm this.   Patient lost insurance due to losing her job recently.  Will defer CT scan until she has insurance/orange card.

## 2015-10-19 NOTE — Assessment & Plan Note (Signed)
Having some SOB with exertion, but improves with inhaler. Afebrile currently but had fever 101 3 days ago. CXR was neg for pneumonia. Normal lung exam currently. Has some cervical lymph node tenderness but improving from ED visit per patient.   This is likely viral. Patient could not afford azithromycin but I don't think she needed it anyway. Will monitor with supportive care.

## 2015-10-19 NOTE — Assessment & Plan Note (Signed)
Having some situational depression likely 2/2 to losing her job recently and recent illness. This has been going on for 1 week. Not MDD. - will not start treatment as this is likely situational. Asked her to let us know if this persists or she feels worse.

## 2015-10-19 NOTE — Patient Instructions (Signed)
Will get your blood pressure medicine supply. Please pick this up tomorrow.  Keep using inhaler for your breathing.  If your sore throat continues after 1 more week please let us know.  If your depression gets worse or not getting better in few weeks please let us know. If you have any thoughts about hurting or killing yourself please call us, call 911, or go to the emergency room immediately.

## 2015-10-19 NOTE — Assessment & Plan Note (Signed)
Continues to have lateral hip pain with tenderness over the trochanteric bursa. Capsaicin did not help.  -asked to call sports med clinic for bursa injection.

## 2015-10-19 NOTE — Assessment & Plan Note (Signed)
Filed Vitals:   10/19/15 0903 10/19/15 0904  BP:  156/68  Pulse:  46  Temp: 97.7 F (36.5 C)    BP elevated 2/2 to not being able to afford HCTZ 12.5mg  daily.   Asked Dr. Maudie Mercury to get [place] free supply. Patient will pick it up from [place] tomorrow.

## 2015-10-19 NOTE — Progress Notes (Signed)
   Subjective:    Patient ID: Jean [name], female    DOB: [birth year], 50 y.o.   MRN: [id number]  HPI  50 yo female with HTN, asthma, chronic hip pain (follows with sports med), tobacco abuse, dyspnea on exertion presents for follow up of HTN, recent URI and chronic fatigue.   Hip pain - Recently  saw sports med, thought her hip pain was 2/2 to trochanteric bursitis, recommended capsaicin cream and to RTC for bursa injection if not improving. She has not improved with capsaicin. Has not called the sports med clinic back. Still continues to have pain on lateral hip.   URI - went to ED on 10/12/15 for sor throat, fever, URI symptoms. Had exp wheezing on ED exam, CXR was normal except for showing 15 mm nodular density over the spine thought to be artifact due to endpalte osteophytosis. rec follow up chest CT to confirm this. Was given azithromycin but patient forgot to pick it up along with robitussin AC.  Patient states coughing is better, had fever of 101 three days ago, still having sore throat, can't afford meds. Using her inhaler as needed twice daily.   HTN - BP 156/68. On hctz 12.5mg  daily. Ran out few days ago. Does not have money for this either.   Having depressed mood, lost her job recently due to hip pain. Also having other things going on in her life which she did not elaborate on. Denies SI/HI/AVH. This has been going on only for 1 week. No previous hx of depression.    Health care maintenance - due for csope, mammogram, pap smear. But will defer them since she does not have insurance currently.   Review of Systems  Constitutional: Positive for chills and fatigue. Negative for fever.  HENT: Positive for sore throat. Negative for congestion.   Respiratory: Positive for cough and shortness of breath. Negative for choking, chest tightness and wheezing.   Cardiovascular: Negative for chest pain, palpitations and leg swelling.  Gastrointestinal: Negative for abdominal pain and  abdominal distention.  Genitourinary: Negative for dysuria and flank pain.  Psychiatric/Behavioral: Positive for dysphoric mood.       Objective:   Physical Exam  Constitutional: She is oriented to person, place, and time. She appears well-developed and well-nourished.  Tearful during the exam   HENT:  Head: Normocephalic and atraumatic.  Eyes: Conjunctivae are normal.  Neck: Normal range of motion. No thyromegaly present.  Has some cervical lymph node tenderness. No significantly enlarged lymph nodes.   Cardiovascular: Exam reveals no gallop and no friction rub.   No murmur heard. Slow HR  Pulmonary/Chest: Effort normal and breath sounds normal. No respiratory distress. She has no wheezes.  Abdominal: Soft. Bowel sounds are normal. She exhibits no distension. There is no tenderness.  Musculoskeletal: Normal range of motion. She exhibits no edema or tenderness.  Neurological: She is alert and oriented to person, place, and time. No cranial nerve deficit. Coordination normal.  Psychiatric: She exhibits a depressed mood.     Filed Vitals:   10/19/15 0903 10/19/15 0904  BP:  156/68  Pulse:  46  Temp: 97.7 F (36.5 C)          Assessment & Plan:  See problem based a&p.

## 2015-10-20 NOTE — Progress Notes (Signed)
Internal Medicine Clinic Attending  Case discussed with Dr. Ahmed at the time of the visit.  We reviewed the resident's history and exam and pertinent patient test results.  I agree with the assessment, diagnosis, and plan of care documented in the resident's note. 

## 2015-10-21 ENCOUNTER — Encounter: Payer: Self-pay | Admitting: Pharmacist

## 2015-10-21 ENCOUNTER — Ambulatory Visit (INDEPENDENT_AMBULATORY_CARE_PROVIDER_SITE_OTHER): Payer: Self-pay | Admitting: Pharmacist

## 2015-10-21 DIAGNOSIS — Z79899 Other long term (current) drug therapy: Secondary | ICD-10-CM

## 2015-10-21 DIAGNOSIS — Z7189 Other specified counseling: Secondary | ICD-10-CM

## 2015-10-21 NOTE — Patient Instructions (Signed)
Patient educated about medication as defined in this encounter and verbalized understanding by repeating back instructions provided.   

## 2015-10-21 NOTE — Progress Notes (Signed)
Worked with patient and [facility] outpatient pharmacy to fill the medication. Advised patient to follow up with financial counselor for cost help. Hydrochlorothiazide was reviewed with the patient, including name, instructions, indication, goals of therapy, potential side effects, importance of adherence, and safe use.  Patient verbalized understanding by repeating back information and was advised to contact me if further medication-related questions arise. Patient was also provided an information handout.

## 2015-11-02 ENCOUNTER — Encounter: Payer: Self-pay | Admitting: Family Medicine

## 2015-11-02 ENCOUNTER — Ambulatory Visit (INDEPENDENT_AMBULATORY_CARE_PROVIDER_SITE_OTHER): Payer: Self-pay | Admitting: Family Medicine

## 2015-11-02 VITALS — BP 136/88 | Ht 63.0 in | Wt 139.0 lb

## 2015-11-02 DIAGNOSIS — M25552 Pain in left hip: Secondary | ICD-10-CM

## 2015-11-02 DIAGNOSIS — M7062 Trochanteric bursitis, left hip: Secondary | ICD-10-CM

## 2015-11-02 DIAGNOSIS — G8929 Other chronic pain: Secondary | ICD-10-CM

## 2015-11-02 MED ORDER — METHYLPREDNISOLONE ACETATE 40 MG/ML IJ SUSP
40.0000 mg | Freq: Once | INTRAMUSCULAR | Status: AC
  Administered 2015-11-02: 40 mg via INTRA_ARTICULAR

## 2015-11-02 NOTE — Progress Notes (Signed)
   Subjective:    Patient ID: Jean [name], female    DOB: 1966-02-13, 50 y.o.   MRN: [id number]  HPI  Jean [name] is a 50 y/o female presenting for 3 years of chronic left lateral hip pain that is progressively worsened. She notes that sitting, ambulation, and stairs makes it worse. The pain is sharp and does not radiate. She now notices pain in her left knee as well.  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 On ROS she endorses weakness on the L side. She has not noticed numbness or tingling. Currently denies right knee pain/swelling.   She has tried icing, stretching, and Ibuprofen 600mg  daily (it makes her sleepy so she isn't sure if it helps, however she wakes up in pain).    Seen 09/07/15 in [facility] where she had x-rays showing minimal DJD L hip.  Continues to have lateral hip pain worse with activity.  Denies paresthesias going down leg.   Review of Systems negative besides that noted in HPI.      Objective:   Physical Exam  Well-nourished, appears younger than stated age. Uncomfortable lying on right side.   Tenderness to palpation over the great trochanter. No effusions noted. Patient with significant pain attempting to stand on left leg to test right hip ROM for comparison. Left hip ROM appears to be limited by pain. Approximately 70* on flexion, 30* extension, 40* abduction, 30* adduction, 35* internal rotation, and 35* external rotation. 4+/5 strength on extension, 5/5 on flexion, 4/5 abduction. Gait with slight limp and decreased swing on the left. Neurovascular intact distally.   2 view left hip 09/07/15 shows minimal degenerative joint disease of the pelvis and acetabular femoral joint on the left.  Assessment & Plan:   1-greater trochanteric pain syndrome-patient still having lateral hip pain that tender to touch MI on. We'll go ahead and try a 1-3 greater trochanter  injection today. We will continue with stretching exercises.   Aspiration/Injection Procedure Note Jean [name] WILL February 17, 1966  Procedure: Injection Indications:  L Trochanteric bursa   Procedure Details Consent: Risks of procedure as well as the alternatives and risks of each were explained to the (patient/caregiver).  Consent for procedure obtained. Time Out: Verified patient identification, verified procedure, site/side was marked, verified correct patient position, special equipment/implants available, medications/allergies/relevent history reviewed, required imaging and test results available.  Performed.  The area was cleaned with iodine and alcohol swabs.    The L troch greater was injected using 2 cc's of 40 Depomedrol and 2 cc's of 1% lidocaine with a 21 1 1/2" needle.  A sterile dressing was applied.  Patient did tolerate procedure well. Estimated blood loss: None

## 2015-11-15 ENCOUNTER — Other Ambulatory Visit: Payer: Self-pay | Admitting: Pharmacist

## 2015-11-15 DIAGNOSIS — I10 Essential (primary) hypertension: Secondary | ICD-10-CM

## 2015-11-17 ENCOUNTER — Ambulatory Visit: Payer: Self-pay | Admitting: Pharmacist

## 2015-11-17 ENCOUNTER — Ambulatory Visit: Payer: Self-pay

## 2015-11-17 MED ORDER — HYDROCHLOROTHIAZIDE 12.5 MG PO TABS: 12.5000 mg | ORAL_TABLET | Freq: Every day | ORAL | Status: DC

## 2015-11-17 MED FILL — HYDROCHLOROTHIAZIDE 12.5 MG: 12.5 | 30 days supply | Qty: 30 | Fill #0

## 2015-11-17 NOTE — Progress Notes (Signed)
Patient reports for help obtaining HCTZ. Referred patient to [facility] outpatient pharmacy.

## 2015-11-18 ENCOUNTER — Ambulatory Visit (INDEPENDENT_AMBULATORY_CARE_PROVIDER_SITE_OTHER): Payer: Self-pay | Admitting: Family Medicine

## 2015-11-18 ENCOUNTER — Encounter: Payer: Self-pay | Admitting: Family Medicine

## 2015-11-18 VITALS — BP 162/94 | HR 59 | Ht 63.0 in | Wt 139.0 lb

## 2015-11-18 DIAGNOSIS — M25552 Pain in left hip: Secondary | ICD-10-CM

## 2015-11-18 DIAGNOSIS — G8929 Other chronic pain: Secondary | ICD-10-CM

## 2015-11-18 NOTE — Assessment & Plan Note (Signed)
Symptoms seem pretty severe. X-ray was nonrevealing. I think at this point we'll have to get it MRI to further evaluate. I'll call her after I get MRI results back.

## 2015-11-18 NOTE — Progress Notes (Signed)
   Subjective:    Patient ID: Jean [name], female    DOB: 1965-10-21, 50 y.o.   MRN: [id number]  HPI Left hip pain for several years. In the last 6 months it has progressed. She was seen here about a month ago and had greater trochanteric bursa injection which seemed to help for about for 5 days. Since then the hip has gotten progressively worse. She's not been able to bear weight and has actually been terminated from her job secondary to her inability to work on a regular basis.   Review of Systems No unusual weight change, no fevers, no sweats, no chills. No other unusual arthralgias or myalgias.    Objective:   Physical Exam  Vital signs are reviewed GEN.: Well-developed female no acute distress HIP: Left. Pain with both internal and external rotation area pain with axial compression. She cannot bear weight comfortably and single stance on the left side. Next BUTTOCK: Nontender to palpation. These no defect. NEURO: Intact sensation to soft touch bilateral lower extremities. DTRs 2+ bilaterally symmetrical. MSK: Muscle bulk and tone in the thighs and lower legs is symmetrical and normal in appearance. She has intact 5 out of 5 symmetrical strength and plantarflexion dorsiflexion, knee extension and flexion. Hip flexion strength appears to be intact but is limited by pain on the left. There is no tenderness to palpation over the left greater trochanteric bursa area there is some mild tenderness to palpation in the piriformis area but this does not reproduce her pain and does not cause any paresthesias. IMAGING: Left hip and pelvis x-rays show mild DJD but no significant pathology. I reviewed these films personally.     Assessment & Plan:

## 2015-11-22 ENCOUNTER — Ambulatory Visit: Payer: Self-pay

## 2015-11-23 ENCOUNTER — Encounter: Payer: Self-pay | Admitting: Internal Medicine

## 2015-11-23 ENCOUNTER — Ambulatory Visit (INDEPENDENT_AMBULATORY_CARE_PROVIDER_SITE_OTHER): Payer: Self-pay | Admitting: Internal Medicine

## 2015-11-23 VITALS — BP 143/78 | HR 60 | Temp 98.1°F | Ht 63.0 in | Wt 134.9 lb

## 2015-11-23 DIAGNOSIS — K769 Liver disease, unspecified: Secondary | ICD-10-CM

## 2015-11-23 DIAGNOSIS — N83201 Unspecified ovarian cyst, right side: Secondary | ICD-10-CM

## 2015-11-23 DIAGNOSIS — M546 Pain in thoracic spine: Secondary | ICD-10-CM

## 2015-11-23 DIAGNOSIS — M5134 Other intervertebral disc degeneration, thoracic region: Secondary | ICD-10-CM | POA: Insufficient documentation

## 2015-11-23 DIAGNOSIS — M2578 Osteophyte, vertebrae: Secondary | ICD-10-CM

## 2015-11-23 DIAGNOSIS — J479 Bronchiectasis, uncomplicated: Secondary | ICD-10-CM

## 2015-11-23 MED ORDER — CYCLOBENZAPRINE HCL 5 MG PO TABS: 5.0000 mg | ORAL_TABLET | Freq: Three times a day (TID) | ORAL | Status: DC | PRN

## 2015-11-23 MED FILL — CYCLOBENZAPRINE 5 MG TABLET: 5 | 5 days supply | Qty: 15 | Fill #0

## 2015-11-23 NOTE — Progress Notes (Signed)
Internal Medicine Clinic Attending  Case discussed with Dr. Patel,Rushil at the time of the visit.  We reviewed the resident's history and exam and pertinent patient test results.  I agree with the assessment, diagnosis, and plan of care documented in the resident's note.  

## 2015-11-23 NOTE — Patient Instructions (Signed)
For the back pain, I think it's related to your muscle. We will try a muscle relaxant. Please do not take with alcohol or use machinery with the medicine as it can make you feel sleepy.  We will also order a CT scan to get a better look at the back.

## 2015-11-23 NOTE — Progress Notes (Signed)
   Subjective:    Patient ID: [name], female    DOB: Nov 05, 1965, 50 y.o.   MRN: [id number]  HPI Ms. Lello is a 50 year old female who presents today for right sided back pain. Please see assessment & plan for status of chronic medical problems.    Review of Systems     Objective:   Physical Exam  Constitutional: She appears well-developed and well-nourished.  HENT:  Head: Normocephalic and atraumatic.  Eyes: Conjunctivae are normal. No scleral icterus.  Pulmonary/Chest: Effort normal. No respiratory distress.  Musculoskeletal: She exhibits tenderness (Tenderness noted overlying right paraspinal muscle inferior to the scapula. Mild asymmetric swelling noted as compared to the contralateral side. No erythema or lesions visualized.).          Assessment & Plan:
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
## 2015-11-27 ENCOUNTER — Ambulatory Visit
Admission: RE | Admit: 2015-11-27 | Discharge: 2015-11-27 | Disposition: A | Discharge status: Home / Self Care | Payer: No Typology Code available for payment source | Source: Ambulatory Visit | Attending: Family Medicine | Admitting: Family Medicine

## 2015-11-27 DIAGNOSIS — M25552 Pain in left hip: Principal | ICD-10-CM

## 2015-11-27 DIAGNOSIS — G8929 Other chronic pain: Secondary | ICD-10-CM

## 2015-11-28 ENCOUNTER — Encounter: Payer: Self-pay | Admitting: Family Medicine

## 2015-11-28 ENCOUNTER — Telehealth: Payer: Self-pay | Admitting: Family Medicine

## 2015-11-28 DIAGNOSIS — M25552 Pain in left hip: Secondary | ICD-10-CM

## 2015-11-28 NOTE — Telephone Encounter (Signed)
[name] Can you call her and tell her she has a tear of the labrum (joint lining) of the hip. I would recommend ortho referral. If she has no specific idea of who she wants to see I guess Dr [name] would be a good bet THANKS! Jean [name]

## 2015-11-30 ENCOUNTER — Ambulatory Visit (HOSPITAL_COMMUNITY)
Admission: RE | Admit: 2015-11-30 | Discharge: 2015-11-30 | Disposition: A | Discharge status: Home / Self Care | Payer: Self-pay | Source: Ambulatory Visit | Attending: Internal Medicine | Admitting: Internal Medicine

## 2015-11-30 DIAGNOSIS — M2578 Osteophyte, vertebrae: Secondary | ICD-10-CM | POA: Insufficient documentation

## 2015-11-30 DIAGNOSIS — D3502 Benign neoplasm of left adrenal gland: Secondary | ICD-10-CM | POA: Insufficient documentation

## 2015-11-30 DIAGNOSIS — M546 Pain in thoracic spine: Secondary | ICD-10-CM | POA: Insufficient documentation

## 2015-11-30 DIAGNOSIS — M47894 Other spondylosis, thoracic region: Secondary | ICD-10-CM | POA: Insufficient documentation

## 2015-11-30 DIAGNOSIS — K769 Liver disease, unspecified: Secondary | ICD-10-CM | POA: Insufficient documentation

## 2015-11-30 NOTE — Telephone Encounter (Signed)
[name] has called her and referred to ortho Dorcas Mcmurray

## 2015-12-01 ENCOUNTER — Telehealth: Payer: Self-pay | Admitting: Internal Medicine

## 2015-12-01 ENCOUNTER — Encounter: Payer: Self-pay | Admitting: *Deleted

## 2015-12-01 ENCOUNTER — Encounter: Payer: Self-pay | Admitting: Internal Medicine

## 2015-12-01 DIAGNOSIS — K769 Liver disease, unspecified: Secondary | ICD-10-CM | POA: Insufficient documentation

## 2015-12-01 DIAGNOSIS — N83209 Unspecified ovarian cyst, unspecified side: Secondary | ICD-10-CM | POA: Insufficient documentation

## 2015-12-01 DIAGNOSIS — J479 Bronchiectasis, uncomplicated: Secondary | ICD-10-CM | POA: Insufficient documentation

## 2015-12-01 HISTORY — DX: Liver disease, unspecified: K76.9

## 2015-12-01 NOTE — Telephone Encounter (Signed)
Dr [name] [facility] Tuesday December 27, 2015 at 10:30am [place], [place], [place] [address] Phone: [phone]

## 2015-12-01 NOTE — Addendum Note (Signed)
Addended by: Cyd Silence on: 12/01/2015 08:58 AM   Modules accepted: Orders

## 2015-12-01 NOTE — Telephone Encounter (Signed)
  Reason for call:   I placed an outgoing call to [name] at 942 AM regarding her recent imaging results .   Assessment/ Plan:   I wanted to ask her additional questions about her tobacco, alcohol use as well as prior history of injuries to her thoracic spine given the degenerative changes noted in an otherwise well-appearing middle-aged woman like herself.  Her voicemail was full, so I had an SMS with the clinic number sent in hopes that she will give [place] a call back later today.   Riccardo Dubin, MD   12/01/2015, 9:42 AM

## 2015-12-01 NOTE — Patient Instructions (Signed)
Dr [name] [facility] Tuesday December 27, 2015 at 10:30am [place], [place],  [address] Phone: [phone]

## 2015-12-01 NOTE — Telephone Encounter (Signed)
Pt calling to get results 
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
## 2015-12-09 ENCOUNTER — Other Ambulatory Visit: Payer: Self-pay | Admitting: Pharmacist

## 2015-12-09 DIAGNOSIS — G8929 Other chronic pain: Secondary | ICD-10-CM

## 2015-12-09 DIAGNOSIS — M25552 Pain in left hip: Secondary | ICD-10-CM

## 2015-12-09 DIAGNOSIS — I10 Essential (primary) hypertension: Secondary | ICD-10-CM

## 2015-12-09 MED ORDER — HYDROCHLOROTHIAZIDE 12.5 MG PO CAPS: 12.5000 mg | ORAL_CAPSULE | Freq: Every day | ORAL | 0 refills | Status: DC

## 2015-12-09 NOTE — Progress Notes (Signed)
Patient approved for [place]. HCTZ prescription transferred.

## 2015-12-20 ENCOUNTER — Encounter (INDEPENDENT_AMBULATORY_CARE_PROVIDER_SITE_OTHER): Payer: Self-pay

## 2015-12-20 ENCOUNTER — Ambulatory Visit (INDEPENDENT_AMBULATORY_CARE_PROVIDER_SITE_OTHER): Payer: No Typology Code available for payment source | Admitting: Internal Medicine

## 2015-12-20 ENCOUNTER — Encounter: Payer: Self-pay | Admitting: Internal Medicine

## 2015-12-20 VITALS — BP 135/106 | Temp 98.2°F | Wt 137.7 lb

## 2015-12-20 DIAGNOSIS — M160 Bilateral primary osteoarthritis of hip: Secondary | ICD-10-CM

## 2015-12-20 DIAGNOSIS — M5134 Other intervertebral disc degeneration, thoracic region: Secondary | ICD-10-CM

## 2015-12-20 DIAGNOSIS — X58XXXD Exposure to other specified factors, subsequent encounter: Secondary | ICD-10-CM

## 2015-12-20 DIAGNOSIS — R252 Cramp and spasm: Secondary | ICD-10-CM

## 2015-12-20 DIAGNOSIS — N83291 Other ovarian cyst, right side: Secondary | ICD-10-CM

## 2015-12-20 DIAGNOSIS — N83201 Unspecified ovarian cyst, right side: Secondary | ICD-10-CM

## 2015-12-20 DIAGNOSIS — S73192D Other sprain of left hip, subsequent encounter: Secondary | ICD-10-CM

## 2015-12-20 MED ORDER — CYCLOBENZAPRINE HCL 10 MG PO TABS: 5.0000 mg | ORAL_TABLET | Freq: Three times a day (TID) | ORAL | 0 refills | Status: DC | PRN

## 2015-12-20 MED ORDER — MELOXICAM 10 MG PO CAPS: 10.0000 mg | ORAL_CAPSULE | Freq: Every day | ORAL | 2 refills | Status: DC

## 2015-12-20 MED ORDER — KETOROLAC TROMETHAMINE 30 MG/ML IJ SOLN
30.0000 mg | Freq: Once | INTRAMUSCULAR | Status: AC
  Administered 2015-12-20: 30 mg via INTRAMUSCULAR

## 2015-12-20 NOTE — Assessment & Plan Note (Signed)
MRI left hip without contrast 11/27/15 showed moderate appearing bilateral hip osteoarthritis with associated tearing of the left anterior, superior labrum is identified. Patient is scheduled for surgery with Dr. [name] on 8/22.

## 2015-12-20 NOTE — Progress Notes (Signed)
    CC: back pain  HPI: [name] is a 50 y.o. female with PMHx of HTN who presents to the clinic for back pain.  Patient developed right sided mid-back pain with radiation around her right side in mid July. Pain has been persistent over the last 4 weeks. She was seen in clinic and received Flexeril 5 mg TID prn for muscle spasm which has helped. CT scan of the Chest later revealed a sizable right lateral osteophyte at T8-T9 with degenerative changes of the lower thoracic spine. Pain is worse with movement and getting up from a seated position. Pain is better with laying down. Flexeril has helped some, but advil and aleve have not. Pain at its worst is a 10/10. At its best, pain is a 0/10 at night. Currently, pain is a 10/10. She denies associated weakness, numbness, loss of bowel or bladder continence.   Past Medical History:  Diagnosis Date  . Herpes   . Hypertension     Review of Systems: A complete ROS was negative except as noted in HPI.   Physical Exam: Vitals:   12/20/15 0854  BP: (!) 135/106  Temp: 98.2 F (36.8 C)  TempSrc: Oral  SpO2: 100%  Weight: 137 lb 11.2 oz (62.5 kg)   General: Vital signs reviewed.  Patient is well-developed and well-nourished, in no acute distress and cooperative with exam.  Cardiovascular: RRR, S1 normal, S2 normal, no murmurs, gallops, or rubs. Pulmonary/Chest: Clear to auscultation bilaterally, no wheezes, rales, or rhonchi. Abdominal: Soft, non-tender, non-distended, BS + Musculoskeletal: Tenderness on palpation of along lateral right border of thoracic spine from T5-T9 with associated tense musculature. No erythema, or rash. Extremities: No lower extremity edema bilaterally, pulses symmetric and intact bilaterally.  Neurological: Strength is normal and symmetric bilaterally in upper and lower extremities, sensory intact to light touch bilaterally.  Skin: Warm, dry and intact. No rashes or erythema. Psychiatric: Flat mood and  affect. Tearful. Cognition and memory are normal.   Assessment & Plan:  See encounters tab for problem based medical decision making. Patient discussed with Dr. Eppie Gibson

## 2015-12-20 NOTE — Assessment & Plan Note (Addendum)
Patient developed right sided mid-back pain with radiation around her right side in mid July. Pain has been persistent over the last 4 weeks. She was seen in clinic and received Flexeril 5 mg TID prn for muscle spasm which has helped. CT scan of the Chest later revealed a sizable right lateral osteophyte at T8-T9 with degenerative changes of the lower thoracic spine. Pain is worse with movement and getting up from a seated position. Pain is better with laying down. Flexeril has helped some, but advil and aleve have not. Pain at its worst is a 10/10. At its best, pain is a 0/10 at night. Currently, pain is a 10/10. She denies associated weakness, numbness, loss of bowel or bladder continence. Physical exam shows tenderness on palpation of along lateral right border of thoracic spine from T5-T9 with associated tense musculature.   Assessment DDD with associated muscle spasm  Plan: -Mobic 15 mg QD, [place] only carries 15 mg, not 7.5 or 10 mg -Flexeril 5 mg TID prn  -Return in 4 weeks for follow up

## 2015-12-20 NOTE — Assessment & Plan Note (Signed)
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 Follow-up pelvic ultrasound in 6 weeks is recommended. I discussed these results with the patient and she became very tearful stating "just one more thing." She was unaware of these results. Patient denies abdominal pain, bloating or weight loss.   Plan: -Pelvic Ultrasound ordered for early August -Follow up in 4 weeks after ultrasound

## 2015-12-20 NOTE — Progress Notes (Signed)
Case discussed with Dr. Burns soon after the resident saw the patient. We reviewed the resident's history and exam and pertinent patient test results. I agree with the assessment, diagnosis, and plan of care documented in the resident's note. 

## 2015-12-20 NOTE — Patient Instructions (Addendum)
TAKE MELOXICAM 15 MG ONE TABLET ONCE A DAY WITH FOOD.  CONTINUE FLEXERIL 5 MG AS THREE TIMES A DAY AS NEEDED FOR PAIN.  FOLLOW UP IN 4 WEEKS.

## 2015-12-27 MED FILL — HYDROCODON-APAP 5-325: 5-325 | 20 days supply | Qty: 60 | Fill #0

## 2016-01-03 ENCOUNTER — Other Ambulatory Visit: Payer: Self-pay | Admitting: Physician Assistant

## 2016-01-06 ENCOUNTER — Other Ambulatory Visit: Payer: Self-pay | Admitting: Internal Medicine

## 2016-01-06 DIAGNOSIS — N83201 Unspecified ovarian cyst, right side: Secondary | ICD-10-CM

## 2016-01-11 NOTE — Progress Notes (Signed)
ekg 4/17, chest ct and chest 7/17, eccho 5/17 ALL IN EPIC

## 2016-01-11 NOTE — Patient Instructions (Addendum)
DEETRA MCCALL  01/11/2016   Your procedure is scheduled on: 01/20/16  Report to [facility]  Entrance take [place]  elevators to 3rd floor to  [place] at 1230 PM.  Call this number if you have problems the morning of surgery [phone] DO NOT USE COCAINE--COCAINE AND ANESTHESIA DO NOT MIX WELL   Remember: ONLY 1 PERSON MAY GO WITH YOU TO SHORT STAY TO GET  READY MORNING OF YOUR SURGERY.  Do not eat food  :After Midnight. May have clear liquids morning of surgery until 0830 am---then nothing by mouth     Take these medicines the morning of surgery with A SIP OF WATER: no regular medications May take FLEXARIL(MUSCLE RELAXATION) if needed or use ALBUTEROL if needed [place] may not have any metal on your body including hair pins and              piercings  Do not wear jewelry, make-up, lotions, powders or perfumes, deodorant             Do not wear nail polish.  Do not shave  48 hours prior to surgery.              Men may shave face and neck.   Do not bring valuables to the hospital. [place].  Contacts, dentures or bridgework may not be worn into surgery.  Leave suitcase in the car. After surgery it may be brought to your room.             - Preparing for Surgery Before surgery, you can play an important role.  Because skin is not sterile, your skin needs to be as free of germs as possible.  You can reduce the number of germs on your skin by washing with CHG (chlorahexidine gluconate) soap before surgery.  CHG is an antiseptic cleaner which kills germs and bonds with the skin to continue killing germs even after washing. Please DO NOT use if you have an allergy to CHG or antibacterial soaps.  If your skin becomes reddened/irritated stop using the CHG and inform your nurse when you arrive at Short Stay. Do not shave (including legs  and underarms) for at least 48 hours prior to the first CHG shower.  You may shave your face/neck. Please follow these instructions carefully:  1.  Shower with CHG Soap the night before surgery and the  morning of Surgery.  2.  If you choose to wash your hair, wash your hair first as usual with your  normal  shampoo.  3.  After you shampoo, rinse your hair and body thoroughly to remove the  shampoo.                           4.  Use CHG as you would any other liquid soap.  You can apply chg directly  to the skin and wash                       Gently with a  scrungie or clean washcloth.  5.  Apply the CHG Soap to your body ONLY FROM THE NECK DOWN.   Do not use on face/ open                           Wound or open sores. Avoid contact with eyes, ears mouth and genitals (private parts).                       Wash face,  Genitals (private parts) with your normal soap.             6.  Wash thoroughly, paying special attention to the area where your surgery  will be performed.  7.  Thoroughly rinse your body with warm water from the neck down.  8.  DO NOT shower/wash with your normal soap after using and rinsing off  the CHG Soap.                9.  Pat yourself dry with a clean towel.            10.  Wear clean pajamas.            11.  Place clean sheets on your bed the night of your first shower and do not  sleep with pets. Day of Surgery : Do not apply any lotions/deodorants the morning of surgery.  Please wear clean clothes to the hospital/surgery center.  FAILURE TO FOLLOW THESE INSTRUCTIONS MAY RESULT IN THE CANCELLATION OF YOUR SURGERY PATIENT SIGNATURE_________________________________  NURSE SIGNATURE__________________________________  ________________________________________________________________________   Adam Phenix  An incentive spirometer is a tool that can help keep your lungs clear and active. This tool measures how well you are filling your lungs with each breath.  Taking long deep breaths may help reverse or decrease the chance of developing breathing (pulmonary) problems (especially infection) following:  A long period of time when you are unable to move or be active. BEFORE THE PROCEDURE   If the spirometer includes an indicator to show your best effort, your nurse or respiratory therapist will set it to a desired goal.  If possible, sit up straight or lean slightly forward. Try not to slouch.  Hold the incentive spirometer in an upright position. INSTRUCTIONS FOR USE  1. Sit on the edge of your bed if possible, or sit up as far as you can in bed or on a chair. 2. Hold the incentive spirometer in an upright position. 3. Breathe out normally. 4. Place the mouthpiece in your mouth and seal your lips tightly around it. 5. Breathe in slowly and as deeply as possible, raising the piston or the ball toward the top of the column. 6. Hold your breath for 3-5 seconds or for as long as possible. Allow the piston or ball to fall to the bottom of the column. 7. Remove the mouthpiece from your mouth and breathe out normally. 8. Rest for a few seconds and repeat Steps 1 through 7 at least 10 times every 1-2 hours when you are awake. Take your time and take a few normal breaths between deep breaths. 9. The spirometer may include an indicator to show your best effort. Use the indicator as a goal to work toward during each repetition. 10. After each set of 10 deep breaths, practice coughing to be sure your lungs are clear. If you have an incision (the cut made at the time of surgery), support your incision  when coughing by placing a pillow or rolled up towels firmly against it. Once you are able to get out of bed, walk around indoors and cough well. You may stop using the incentive spirometer when instructed by your caregiver.  RISKS AND COMPLICATIONS  Take your time so you do not get dizzy or light-headed.  If you are in pain, you may need to take or ask for pain  medication before doing incentive spirometry. It is harder to take a deep breath if you are having pain. AFTER USE  Rest and breathe slowly and easily.  It can be helpful to keep track of a log of your progress. Your caregiver can provide you with a simple table to help with this. If you are using the spirometer at home, follow these instructions: [place] IF:   You are having difficultly using the spirometer.  You have trouble using the spirometer as often as instructed.  Your pain medication is not giving enough relief while using the spirometer.  You develop fever of 100.5 F (38.1 C) or higher. SEEK IMMEDIATE MEDICAL CARE IF:   You cough up bloody sputum that had not been present before.  You develop fever of 102 F (38.9 C) or greater.  You develop worsening pain at or near the incision site. MAKE SURE YOU:   Understand these instructions.  Will watch your condition.  Will get help right away if you are not doing well or get worse. Document Released: 09/03/2006 Document Revised: 07/16/2011 Document Reviewed: 11/04/2006 ExitCare Patient Information 2014 [place].   ________________________________________________________________________    CLEAR LIQUID DIET   Foods Allowed                                                                     Foods Excluded  Coffee and tea, regular and decaf                             liquids that you cannot  Plain Jell-O in any flavor                                             see through such as: Fruit ices (not with fruit pulp)                                     milk, soups, orange juice  Iced Popsicles                                    All solid food Carbonated beverages, regular and diet                                    Cranberry, grape and apple juices Sports drinks like Gatorade Lightly seasoned clear broth or consume(fat free) Sugar, honey syrup  Sample Menu Breakfast  Lunch                                     Supper Cranberry juice                    Beef broth                            Chicken broth Jell-O                                     Grape juice                           Apple juice Coffee or tea                        Jell-O                                      Popsicle                                                Coffee or tea                        Coffee or tea  _____________________________________________________________________

## 2016-01-12 ENCOUNTER — Encounter (HOSPITAL_COMMUNITY): Payer: Self-pay

## 2016-01-12 ENCOUNTER — Encounter (HOSPITAL_COMMUNITY)
Admission: RE | Admit: 2016-01-12 | Discharge: 2016-01-12 | Disposition: A | Discharge status: Home / Self Care | Payer: No Typology Code available for payment source | Source: Ambulatory Visit | Attending: Orthopaedic Surgery | Admitting: Orthopaedic Surgery

## 2016-01-12 DIAGNOSIS — Z01812 Encounter for preprocedural laboratory examination: Secondary | ICD-10-CM | POA: Insufficient documentation

## 2016-01-12 HISTORY — DX: Gastro-esophageal reflux disease without esophagitis: K21.9

## 2016-01-12 HISTORY — DX: Carpal tunnel syndrome, unspecified upper limb: G56.00

## 2016-01-12 HISTORY — DX: Major depressive disorder, single episode, unspecified: F32.9

## 2016-01-12 HISTORY — DX: Unspecified osteoarthritis, unspecified site: M19.90

## 2016-01-12 HISTORY — DX: Depression, unspecified: F32.A

## 2016-01-12 HISTORY — DX: Headache: R51

## 2016-01-12 HISTORY — DX: Headache, unspecified: R51.9

## 2016-01-12 LAB — ABO/RH: ABO/RH(D): O POS

## 2016-01-12 LAB — CBC
HEMATOCRIT: 40.4 % (ref 36.0–46.0)
Hemoglobin: 13.4 g/dL (ref 12.0–15.0)
MCH: 32.8 pg (ref 26.0–34.0)
MCHC: 33.2 g/dL (ref 30.0–36.0)
MCV: 99 fL (ref 78.0–100.0)
Platelets: 361 10*3/uL (ref 150–400)
RBC: 4.08 MIL/uL (ref 3.87–5.11)
RDW: 13.5 % (ref 11.5–15.5)
WBC: 7.9 10*3/uL (ref 4.0–10.5)

## 2016-01-12 LAB — SURGICAL PCR SCREEN
MRSA, PCR: POSITIVE — AB
Staphylococcus aureus: POSITIVE — AB

## 2016-01-12 LAB — BASIC METABOLIC PANEL
Anion gap: 8 (ref 5–15)
BUN: 22 mg/dL — AB (ref 6–20)
CALCIUM: 9.9 mg/dL (ref 8.9–10.3)
CO2: 27 mmol/L (ref 22–32)
Chloride: 107 mmol/L (ref 101–111)
Creatinine, Ser: 1.21 mg/dL — ABNORMAL HIGH (ref 0.44–1.00)
GFR calc Af Amer: 59 mL/min — ABNORMAL LOW (ref >60)
GFR, EST NON AFRICAN AMERICAN: 51 mL/min — AB (ref >60)
GLUCOSE: 92 mg/dL (ref 65–99)
POTASSIUM: 5 mmol/L (ref 3.5–5.1)
Sodium: 142 mmol/L (ref 135–145)

## 2016-01-12 LAB — HCG, SERUM, QUALITATIVE: Preg, Serum: NEGATIVE

## 2016-01-17 ENCOUNTER — Ambulatory Visit (INDEPENDENT_AMBULATORY_CARE_PROVIDER_SITE_OTHER): Payer: No Typology Code available for payment source | Admitting: Internal Medicine

## 2016-01-17 DIAGNOSIS — M2578 Osteophyte, vertebrae: Secondary | ICD-10-CM

## 2016-01-17 DIAGNOSIS — M5134 Other intervertebral disc degeneration, thoracic region: Secondary | ICD-10-CM

## 2016-01-17 DIAGNOSIS — M62830 Muscle spasm of back: Secondary | ICD-10-CM

## 2016-01-17 DIAGNOSIS — F4321 Adjustment disorder with depressed mood: Secondary | ICD-10-CM

## 2016-01-17 MED ORDER — SERTRALINE HCL 50 MG PO TABS: 50.0000 mg | ORAL_TABLET | Freq: Every day | ORAL | 2 refills | Status: DC

## 2016-01-17 MED ORDER — DICLOFENAC SODIUM 50 MG PO TBEC: 50.0000 mg | DELAYED_RELEASE_TABLET | Freq: Two times a day (BID) | ORAL | 1 refills | Status: DC | PRN

## 2016-01-17 NOTE — Assessment & Plan Note (Signed)
Endorses ongoing depressed mood secondary to social stressors (recently lost job, poor living situation with stressful roommate, financial stress, multiple health issues, chronic pain). Endorses anhedonia, hypersomnia, fatigue, states that she has thought about dying but never had any plans to hurt herself. Concerning for MDD but remains unclear if this depressed mood is impairing her ability to function, may be simple situational.  Assessment: Situational depression vs MDD  Plan: - Trial of Zoloft 50 mg daily - Formal PHQ9 at next visit - Consider changing to Duloxetine (Cymbalta) in the future as this would also help with her chronic pain from DDD - was not available via [facility] outpatient pharmacy/affordable so not prescribed.

## 2016-01-17 NOTE — Assessment & Plan Note (Signed)
Continues to endorse previously-described right-sided thoracic back pain with muscle spasm and pain radiation to her right scapula since mid July. Imaging confirmed osteophyte present at T8-T9 on the right that is almost certainly the source of her pain. Localized irritation and inflammation has also precipitated tense muscle spasms over her right upper back that is palpable on exam. Her pain has not responded to Tylenol, Aleve, Advil, and had no improvement with Mobic prescribed at last visit. Flexeril has improved her symptoms to some degree. Patient states that inactivity worsens her pain and that her pain is worst in the morning when her muscles are stiff. She has never tried physical therapy but is agreeable to trying this in the future. Patient also has significant social stressors and depressed mood over the past few 2 months, likely some psychologic component to her distress. Patient is also scheduled for a L hip replacement surgery with Dr. [name] at [facility] this Friday.  Assessment: DDD with thoracic osteophyte and associated muscle spasm  Plan: - Trial of Diclofenac 50 mg BID PRN - Continue Flexeril 5 mg TID PRN - Postpone referral to PT given planned surgery this Friday - it would be ideal if PT could evaluate her back and shoulder pain during the post-op hip evaluation and rehab process - Follow up in 1 month

## 2016-01-17 NOTE — Progress Notes (Signed)
Patient ID: Jean [name], female   DOB: [birth year], 50 y.o.   MRN: [id number]  I saw and evaluated the patient. I personally confirmed the key portions of Dr. Durenda Age history and exam and reviewed pertinent patient test results. The assessment, diagnosis, and plan were formulated together and I agree with the documentation in the resident's note.

## 2016-01-17 NOTE — Progress Notes (Signed)
   CC: Chronic back pain  HPI:  [name] is a 50 y.o. female with PMHx detailed below presenting with ongoing thoracic back pain secondary to degenerative disc disease (sizable osteophyte at T8-T9 visible on imaging). Her pain is located in thoracic spine on the right and extends to her right shoulder. It began in early July of this year, and has been refractory to Tylenol, Advil, Aleve, and Mobic - some relief with Flexeril. She has been seen multiple times by our clinic and has significant social stressors including job loss, stressful living situation, and other health issues.  See problem based assessment and plan below for additional details.  Past Medical History:  Diagnosis Date  . Arthritis   . Bronchitis    states at least 2-3 months ago  . Carpal tunnel syndrome    bilateral  . Depression   . GERD (gastroesophageal reflux disease)   . Headache    migraines  . Heart murmur   . Herpes   . Hypertension     Review of Systems: Review of Systems  Constitutional: Negative for chills, fever and weight loss.  HENT: Positive for tinnitus.   Respiratory: Negative for shortness of breath.   Cardiovascular: Negative for chest pain.  Gastrointestinal: Negative for abdominal pain, constipation and diarrhea.  Genitourinary: Negative for frequency and urgency.  Musculoskeletal: Positive for back pain, joint pain, myalgias and neck pain. Negative for falls.  Neurological: Negative for headaches.  Psychiatric/Behavioral: Positive for depression. Negative for suicidal ideas. The patient is not nervous/anxious and does not have insomnia.      Physical Exam: Vitals:   01/17/16 0940  BP: (!) 123/55  Pulse: (!) 57  Temp: 98.1 F (36.7 C)  TempSrc: Oral  SpO2: 100%  Weight: 136 lb 11.2 oz (62 kg)   GENERAL- Woman sitting in exam room chair with cane, alert, mild distress, grimaces when shifting seated posture HEENT- Atraumatic, PERRL, EOMI, moist mucous  membranes CARDIAC- Regular rate and rhythm, no murmurs, rubs or gallops. RESP- Clear to ascultation bilaterally, no wheezing or crackles, normal work of breathing ABDOMEN- Normoactive bowel sounds, soft, nontender BACK- Normal curvature, moderate thoracic spinal and paraspinal tenderness, extends along tense musculature to the right scapula, which also is moderately tender to palpation, no palpable deformities EXTREMITIES- Normal bulk and range of motion, left hip mildly tender to palpation, no edema, 2+ peripheral pulses SKIN- Warm, dry, intact, without visible rash PSYCH- Guarded and then tearful affect, clear speech, thoughts linear and goal-directed   Assessment & Plan:   See encounters tab for problem based medical decision making.  Patient seen with Dr. Eppie Gibson

## 2016-01-17 NOTE — Patient Instructions (Addendum)
Please continue to take your medications as prescribed.  For your back pain we have prescribed Voltaren 50mg  that you can take up to twice daily for back pain. For mood we have prescribed Zoloft 50mg  daily. Please schedule a follow up appointment for 1 month from now.  Thank you for visiting [place] today!

## 2016-01-18 ENCOUNTER — Ambulatory Visit (HOSPITAL_COMMUNITY): Payer: No Typology Code available for payment source

## 2016-01-20 ENCOUNTER — Inpatient Hospital Stay (HOSPITAL_COMMUNITY)
Admission: RE | Admit: 2016-01-20 | Discharge: 2016-01-23 | DRG: 470 | Disposition: A | Discharge status: Home Health | Payer: Self-pay | Source: Ambulatory Visit | Attending: Orthopaedic Surgery | Admitting: Orthopaedic Surgery

## 2016-01-20 ENCOUNTER — Inpatient Hospital Stay (HOSPITAL_COMMUNITY): Payer: Self-pay

## 2016-01-20 ENCOUNTER — Encounter (HOSPITAL_COMMUNITY): Payer: Self-pay | Admitting: *Deleted

## 2016-01-20 ENCOUNTER — Inpatient Hospital Stay (HOSPITAL_COMMUNITY): Payer: Self-pay | Admitting: Certified Registered Nurse Anesthetist

## 2016-01-20 ENCOUNTER — Encounter (HOSPITAL_COMMUNITY)
Admission: RE | Disposition: A | Discharge status: Home Health | Payer: Self-pay | Source: Ambulatory Visit | Attending: Orthopaedic Surgery

## 2016-01-20 DIAGNOSIS — G8929 Other chronic pain: Secondary | ICD-10-CM

## 2016-01-20 DIAGNOSIS — M1612 Unilateral primary osteoarthritis, left hip: Principal | ICD-10-CM | POA: Diagnosis present

## 2016-01-20 DIAGNOSIS — F17210 Nicotine dependence, cigarettes, uncomplicated: Secondary | ICD-10-CM | POA: Diagnosis present

## 2016-01-20 DIAGNOSIS — I10 Essential (primary) hypertension: Secondary | ICD-10-CM | POA: Diagnosis present

## 2016-01-20 DIAGNOSIS — Z833 Family history of diabetes mellitus: Secondary | ICD-10-CM

## 2016-01-20 DIAGNOSIS — Z823 Family history of stroke: Secondary | ICD-10-CM

## 2016-01-20 DIAGNOSIS — Z8249 Family history of ischemic heart disease and other diseases of the circulatory system: Secondary | ICD-10-CM

## 2016-01-20 DIAGNOSIS — M25552 Pain in left hip: Secondary | ICD-10-CM

## 2016-01-20 DIAGNOSIS — R2689 Other abnormalities of gait and mobility: Secondary | ICD-10-CM

## 2016-01-20 DIAGNOSIS — K219 Gastro-esophageal reflux disease without esophagitis: Secondary | ICD-10-CM | POA: Diagnosis present

## 2016-01-20 DIAGNOSIS — Z419 Encounter for procedure for purposes other than remedying health state, unspecified: Secondary | ICD-10-CM

## 2016-01-20 DIAGNOSIS — Z96642 Presence of left artificial hip joint: Secondary | ICD-10-CM

## 2016-01-20 HISTORY — PX: TOTAL HIP ARTHROPLASTY: SHX124

## 2016-01-20 LAB — RAPID URINE DRUG SCREEN, HOSP PERFORMED
AMPHETAMINES: NOT DETECTED
BARBITURATES: NOT DETECTED
BENZODIAZEPINES: NOT DETECTED
Cocaine: NOT DETECTED
Opiates: NOT DETECTED
TETRAHYDROCANNABINOL: NOT DETECTED

## 2016-01-20 LAB — TYPE AND SCREEN
ABO/RH(D): O POS
Antibody Screen: NEGATIVE

## 2016-01-20 SURGERY — ARTHROPLASTY, HIP, TOTAL, ANTERIOR APPROACH
Anesthesia: General | Laterality: Left

## 2016-01-20 MED ORDER — DIPHENHYDRAMINE HCL 12.5 MG/5ML PO ELIX: 12.5000 mg | ORAL_SOLUTION | ORAL | Status: DC | PRN

## 2016-01-20 MED ORDER — TRANEXAMIC ACID 1000 MG/10ML IV SOLN
1000.0000 mg | INTRAVENOUS | Status: AC
  Administered 2016-01-20: 1000 mg via INTRAVENOUS
  Filled 2016-01-20: qty 1100

## 2016-01-20 MED ORDER — DEXAMETHASONE SODIUM PHOSPHATE 10 MG/ML IJ SOLN
INTRAMUSCULAR | Status: DC | PRN
  Administered 2016-01-20: 10 mg via INTRAVENOUS

## 2016-01-20 MED ORDER — VANCOMYCIN HCL IN DEXTROSE 1-5 GM/200ML-% IV SOLN
1000.0000 mg | Freq: Once | INTRAVENOUS | Status: AC
  Administered 2016-01-20: 1000 mg via INTRAVENOUS

## 2016-01-20 MED ORDER — LIDOCAINE 2% (20 MG/ML) 5 ML SYRINGE
INTRAMUSCULAR | Status: DC | PRN
  Administered 2016-01-20: 80 mg via INTRAVENOUS

## 2016-01-20 MED ORDER — CEFAZOLIN SODIUM-DEXTROSE 2-4 GM/100ML-% IV SOLN
INTRAVENOUS | Status: AC
  Filled 2016-01-20: qty 100

## 2016-01-20 MED ORDER — OXYCODONE HCL 5 MG PO TABS
5.0000 mg | ORAL_TABLET | ORAL | Status: DC | PRN
  Administered 2016-01-20: 5 mg via ORAL
  Administered 2016-01-21: 10 mg via ORAL
  Administered 2016-01-21: 5 mg via ORAL
  Administered 2016-01-21 – 2016-01-23 (×12): 10 mg via ORAL
  Filled 2016-01-20: qty 2
  Filled 2016-01-20: qty 1
  Filled 2016-01-20 (×12): qty 2
  Filled 2016-01-20: qty 1

## 2016-01-20 MED ORDER — PROMETHAZINE HCL 25 MG/ML IJ SOLN: 12.5000 mg | INTRAMUSCULAR | Status: DC | PRN

## 2016-01-20 MED ORDER — PHENOL 1.4 % MT LIQD: 1.0000 | OROMUCOSAL | Status: DC | PRN

## 2016-01-20 MED ORDER — ACETAMINOPHEN 325 MG PO TABS: 650.0000 mg | ORAL_TABLET | Freq: Four times a day (QID) | ORAL | Status: DC | PRN

## 2016-01-20 MED ORDER — ASPIRIN EC 325 MG PO TBEC
325.0000 mg | DELAYED_RELEASE_TABLET | Freq: Every day | ORAL | Status: DC
  Administered 2016-01-21 – 2016-01-23 (×3): 325 mg via ORAL
  Filled 2016-01-20 (×3): qty 1

## 2016-01-20 MED ORDER — ONDANSETRON HCL 4 MG PO TABS: 4.0000 mg | ORAL_TABLET | Freq: Four times a day (QID) | ORAL | Status: DC | PRN

## 2016-01-20 MED ORDER — FENTANYL CITRATE (PF) 100 MCG/2ML IJ SOLN
INTRAMUSCULAR | Status: AC
  Administered 2016-01-20: 50 ug via INTRAVENOUS
  Filled 2016-01-20: qty 2

## 2016-01-20 MED ORDER — PROMETHAZINE HCL 25 MG PO TABS
12.5000 mg | ORAL_TABLET | ORAL | Status: DC | PRN
Start: 2016-01-20 — End: 2016-01-23
  Administered 2016-01-20: 12.5 mg via ORAL
  Filled 2016-01-20 (×2): qty 1

## 2016-01-20 MED ORDER — SODIUM CHLORIDE 0.9 % IR SOLN
Status: DC | PRN
  Administered 2016-01-20: 1000 mL

## 2016-01-20 MED ORDER — MENTHOL 3 MG MT LOZG
1.0000 | LOZENGE | OROMUCOSAL | Status: DC | PRN
  Filled 2016-01-20: qty 9

## 2016-01-20 MED ORDER — MEPERIDINE HCL 50 MG/ML IJ SOLN: 6.2500 mg | INTRAMUSCULAR | Status: DC | PRN

## 2016-01-20 MED ORDER — ALBUTEROL SULFATE (2.5 MG/3ML) 0.083% IN NEBU: 2.5000 mg | INHALATION_SOLUTION | Freq: Four times a day (QID) | RESPIRATORY_TRACT | Status: DC | PRN

## 2016-01-20 MED ORDER — MIDAZOLAM HCL 2 MG/2ML IJ SOLN
INTRAMUSCULAR | Status: AC
  Filled 2016-01-20: qty 2

## 2016-01-20 MED ORDER — STERILE WATER FOR IRRIGATION IR SOLN
Status: DC | PRN
  Administered 2016-01-20: 2000 mL

## 2016-01-20 MED ORDER — KETOROLAC TROMETHAMINE 15 MG/ML IJ SOLN
7.5000 mg | Freq: Four times a day (QID) | INTRAMUSCULAR | Status: AC
  Administered 2016-01-20 – 2016-01-21 (×4): 7.5 mg via INTRAVENOUS
  Filled 2016-01-20 (×4): qty 1

## 2016-01-20 MED ORDER — FENTANYL CITRATE (PF) 100 MCG/2ML IJ SOLN
INTRAMUSCULAR | Status: AC
  Filled 2016-01-20: qty 2

## 2016-01-20 MED ORDER — METOCLOPRAMIDE HCL 5 MG PO TABS
5.0000 mg | ORAL_TABLET | Freq: Three times a day (TID) | ORAL | Status: DC | PRN
  Administered 2016-01-20: 10 mg via ORAL
  Filled 2016-01-20: qty 2

## 2016-01-20 MED ORDER — VANCOMYCIN HCL IN DEXTROSE 1-5 GM/200ML-% IV SOLN
1000.0000 mg | Freq: Two times a day (BID) | INTRAVENOUS | Status: AC
  Administered 2016-01-21: 1000 mg via INTRAVENOUS
  Filled 2016-01-20: qty 200

## 2016-01-20 MED ORDER — HYDROMORPHONE HCL 2 MG/ML IJ SOLN
INTRAMUSCULAR | Status: AC
  Filled 2016-01-20: qty 1

## 2016-01-20 MED ORDER — SUCCINYLCHOLINE CHLORIDE 200 MG/10ML IV SOSY
PREFILLED_SYRINGE | INTRAVENOUS | Status: DC | PRN
  Administered 2016-01-20: 100 mg via INTRAVENOUS

## 2016-01-20 MED ORDER — ALBUTEROL SULFATE HFA 108 (90 BASE) MCG/ACT IN AERS: 1.0000 | INHALATION_SPRAY | Freq: Four times a day (QID) | RESPIRATORY_TRACT | Status: DC | PRN

## 2016-01-20 MED ORDER — METHOCARBAMOL 500 MG PO TABS
500.0000 mg | ORAL_TABLET | Freq: Four times a day (QID) | ORAL | Status: DC | PRN
  Administered 2016-01-21: 500 mg via ORAL
  Filled 2016-01-20: qty 1

## 2016-01-20 MED ORDER — METHOCARBAMOL 1000 MG/10ML IJ SOLN
500.0000 mg | Freq: Four times a day (QID) | INTRAVENOUS | Status: DC | PRN
  Administered 2016-01-20: 500 mg via INTRAVENOUS
  Filled 2016-01-20: qty 550
  Filled 2016-01-20: qty 5

## 2016-01-20 MED ORDER — ONDANSETRON HCL 4 MG/2ML IJ SOLN
INTRAMUSCULAR | Status: DC | PRN
  Administered 2016-01-20: 4 mg via INTRAVENOUS

## 2016-01-20 MED ORDER — ALUM & MAG HYDROXIDE-SIMETH 200-200-20 MG/5ML PO SUSP
30.0000 mL | ORAL | Status: DC | PRN
  Administered 2016-01-21 – 2016-01-22 (×2): 30 mL via ORAL
  Filled 2016-01-20 (×3): qty 30

## 2016-01-20 MED ORDER — PROPOFOL 10 MG/ML IV BOLUS
INTRAVENOUS | Status: DC | PRN
  Administered 2016-01-20: 150 mg via INTRAVENOUS

## 2016-01-20 MED ORDER — ACETAMINOPHEN 650 MG RE SUPP: 650.0000 mg | Freq: Four times a day (QID) | RECTAL | Status: DC | PRN

## 2016-01-20 MED ORDER — PROPOFOL 10 MG/ML IV BOLUS
INTRAVENOUS | Status: AC
  Filled 2016-01-20: qty 20

## 2016-01-20 MED ORDER — ONDANSETRON HCL 4 MG/2ML IJ SOLN
INTRAMUSCULAR | Status: AC
  Filled 2016-01-20: qty 2

## 2016-01-20 MED ORDER — HYDROMORPHONE HCL 1 MG/ML IJ SOLN
1.0000 mg | INTRAMUSCULAR | Status: DC | PRN
  Administered 2016-01-21: 1 mg via INTRAVENOUS
  Filled 2016-01-20: qty 1

## 2016-01-20 MED ORDER — SODIUM CHLORIDE 0.9 % IV SOLN
INTRAVENOUS | Status: DC
  Administered 2016-01-20: 75 mL/h via INTRAVENOUS
  Administered 2016-01-21: 11:00:00 via INTRAVENOUS

## 2016-01-20 MED ORDER — HYDROMORPHONE HCL 1 MG/ML IJ SOLN
INTRAMUSCULAR | Status: DC | PRN
  Administered 2016-01-20 (×2): 0.5 mg via INTRAVENOUS
  Administered 2016-01-20: 1 mg via INTRAVENOUS

## 2016-01-20 MED ORDER — ROCURONIUM BROMIDE 10 MG/ML (PF) SYRINGE
PREFILLED_SYRINGE | INTRAVENOUS | Status: DC | PRN
  Administered 2016-01-20: 40 mg via INTRAVENOUS
  Administered 2016-01-20: 20 mg via INTRAVENOUS

## 2016-01-20 MED ORDER — VANCOMYCIN HCL IN DEXTROSE 1-5 GM/200ML-% IV SOLN
INTRAVENOUS | Status: AC
  Filled 2016-01-20: qty 200

## 2016-01-20 MED ORDER — METOCLOPRAMIDE HCL 5 MG/ML IJ SOLN: 10.0000 mg | Freq: Once | INTRAMUSCULAR | Status: DC | PRN

## 2016-01-20 MED ORDER — METOCLOPRAMIDE HCL 5 MG/ML IJ SOLN: 5.0000 mg | Freq: Three times a day (TID) | INTRAMUSCULAR | Status: DC | PRN

## 2016-01-20 MED ORDER — CEFAZOLIN SODIUM-DEXTROSE 2-4 GM/100ML-% IV SOLN
2.0000 g | INTRAVENOUS | Status: AC
  Administered 2016-01-20: 2 g via INTRAVENOUS

## 2016-01-20 MED ORDER — LACTATED RINGERS IV SOLN: INTRAVENOUS | Status: DC

## 2016-01-20 MED ORDER — FENTANYL CITRATE (PF) 100 MCG/2ML IJ SOLN
INTRAMUSCULAR | Status: DC | PRN
  Administered 2016-01-20 (×3): 50 ug via INTRAVENOUS
  Administered 2016-01-20: 100 ug via INTRAVENOUS
  Administered 2016-01-20: 50 ug via INTRAVENOUS

## 2016-01-20 MED ORDER — ONDANSETRON HCL 4 MG/2ML IJ SOLN
4.0000 mg | Freq: Four times a day (QID) | INTRAMUSCULAR | Status: DC | PRN
  Administered 2016-01-20: 4 mg via INTRAVENOUS
  Filled 2016-01-20: qty 2

## 2016-01-20 MED ORDER — MIDAZOLAM HCL 5 MG/5ML IJ SOLN
INTRAMUSCULAR | Status: DC | PRN
  Administered 2016-01-20: 2 mg via INTRAVENOUS

## 2016-01-20 MED ORDER — LACTATED RINGERS IV SOLN
INTRAVENOUS | Status: DC
  Administered 2016-01-20: 1000 mL via INTRAVENOUS
  Administered 2016-01-20: 16:00:00 via INTRAVENOUS

## 2016-01-20 MED ORDER — HYDROCHLOROTHIAZIDE 12.5 MG PO CAPS
12.5000 mg | ORAL_CAPSULE | Freq: Every day | ORAL | Status: DC
  Administered 2016-01-21 – 2016-01-23 (×3): 12.5 mg via ORAL
  Filled 2016-01-20 (×3): qty 1

## 2016-01-20 MED ORDER — DOCUSATE SODIUM 100 MG PO CAPS
100.0000 mg | ORAL_CAPSULE | Freq: Two times a day (BID) | ORAL | Status: DC
  Administered 2016-01-22 – 2016-01-23 (×2): 100 mg via ORAL
  Filled 2016-01-20 (×4): qty 1

## 2016-01-20 MED ORDER — FENTANYL CITRATE (PF) 100 MCG/2ML IJ SOLN
25.0000 ug | INTRAMUSCULAR | Status: DC | PRN
  Administered 2016-01-20 (×2): 50 ug via INTRAVENOUS

## 2016-01-20 MED ORDER — SUGAMMADEX SODIUM 200 MG/2ML IV SOLN
INTRAVENOUS | Status: AC
  Filled 2016-01-20: qty 2

## 2016-01-20 SURGICAL SUPPLY — 37 items
APL SKNCLS STERI-STRIP NONHPOA (GAUZE/BANDAGES/DRESSINGS) ×1
BAG SPEC THK2 15X12 ZIP CLS (MISCELLANEOUS)
BAG ZIPLOCK 12X15 (MISCELLANEOUS) IMPLANT
BENZOIN TINCTURE PRP APPL 2/3 (GAUZE/BANDAGES/DRESSINGS) ×3 IMPLANT
BLADE SAW SGTL 18X1.27X75 (BLADE) ×2 IMPLANT
BLADE SAW SGTL 18X1.27X75MM (BLADE) ×1
CAPT HIP TOTAL 2 ×3 IMPLANT
CELLS DAT CNTRL 66122 CELL SVR (MISCELLANEOUS) ×1 IMPLANT
CLOSURE WOUND 1/2 X4 (GAUZE/BANDAGES/DRESSINGS) ×1
CLOTH BEACON ORANGE TIMEOUT ST (SAFETY) ×3 IMPLANT
DRAPE STERI IOBAN 125X83 (DRAPES) ×3 IMPLANT
DRAPE U-SHAPE 47X51 STRL (DRAPES) ×6 IMPLANT
DRSG AQUACEL AG ADV 3.5X10 (GAUZE/BANDAGES/DRESSINGS) ×3 IMPLANT
DURAPREP 26ML APPLICATOR (WOUND CARE) ×3 IMPLANT
ELECT REM PT RETURN 9FT ADLT (ELECTROSURGICAL) ×3
ELECTRODE REM PT RTRN 9FT ADLT (ELECTROSURGICAL) ×1 IMPLANT
GAUZE XEROFORM 1X8 LF (GAUZE/BANDAGES/DRESSINGS) IMPLANT
GLOVE BIO SURGEON STRL SZ7.5 (GLOVE) ×3 IMPLANT
GLOVE BIOGEL PI IND STRL 8 (GLOVE) ×2 IMPLANT
GLOVE BIOGEL PI INDICATOR 8 (GLOVE) ×4
GLOVE ECLIPSE 8.0 STRL XLNG CF (GLOVE) ×3 IMPLANT
GOWN STRL REUS W/TWL XL LVL3 (GOWN DISPOSABLE) ×6 IMPLANT
HANDPIECE INTERPULSE COAX TIP (DISPOSABLE) ×3
HOLDER FOLEY CATH W/STRAP (MISCELLANEOUS) ×3 IMPLANT
PACK ANTERIOR HIP CUSTOM (KITS) ×3 IMPLANT
RTRCTR WOUND ALEXIS 18CM MED (MISCELLANEOUS) ×3
SET HNDPC FAN SPRY TIP SCT (DISPOSABLE) ×1 IMPLANT
STAPLER VISISTAT 35W (STAPLE) IMPLANT
STRIP CLOSURE SKIN 1/2X4 (GAUZE/BANDAGES/DRESSINGS) ×2 IMPLANT
SUT ETHIBOND NAB CT1 #1 30IN (SUTURE) ×3 IMPLANT
SUT MNCRL AB 4-0 PS2 18 (SUTURE) ×2 IMPLANT
SUT VIC AB 0 CT1 36 (SUTURE) ×3 IMPLANT
SUT VIC AB 1 CT1 36 (SUTURE) ×3 IMPLANT
SUT VIC AB 2-0 CT1 27 (SUTURE) ×6
SUT VIC AB 2-0 CT1 TAPERPNT 27 (SUTURE) ×2 IMPLANT
TRAY FOLEY W/METER SILVER 16FR (SET/KITS/TRAYS/PACK) ×3 IMPLANT
YANKAUER SUCT BULB TIP NO VENT (SUCTIONS) ×3 IMPLANT

## 2016-01-20 NOTE — H&P (Signed)
TOTAL HIP ADMISSION H&P  Patient is admitted for left total hip arthroplasty.  Subjective:  Chief Complaint: left hip pain  HPI: Jean [name], 50 y.o. female, has a history of pain and functional disability in the left hip(s) due to arthritis and patient has failed non-surgical conservative treatments for greater than 12 weeks to include NSAID's and/or analgesics, corticosteriod injections, use of assistive devices, weight reduction as appropriate and activity modification.  Onset of symptoms was gradual starting 2 years ago with rapidlly worsening course since that time.The patient noted no past surgery on the left hip(s).  Patient currently rates pain in the left hip at 10 out of 10 with activity. Patient has night pain, worsening of pain with activity and weight bearing, pain that interfers with activities of daily living and pain with passive range of motion. Patient has evidence of subchondral sclerosis, periarticular osteophytes and joint space narrowing and degenerative labral tearing by imaging studies. This condition presents safety issues increasing the risk of falls.  There is no current active infection.  Patient Active Problem List   Diagnosis Date Noted  . Osteoarthritis of left hip 01/20/2016  . Liver lesion 12/01/2015  . Bronchiectasis ([place]) 12/01/2015  . Ovarian cyst 12/01/2015  . DDD (degenerative disc disease), thoracic 11/23/2015  . Abnormal CXR 10/19/2015  . Acute bronchitis 10/19/2015  . Situational depression 10/19/2015  . Dyspnea on exertion 08/22/2015  . HTN (hypertension) 07/08/2015  . Tear of acetabular labrum 07/08/2015  . Routine health maintenance 07/08/2015  . Tobacco abuse 07/08/2015   Past Medical History:  Diagnosis Date  . Arthritis   . Bronchitis    states at least 2-3 months ago  . Carpal tunnel syndrome    bilateral  . Depression   . GERD (gastroesophageal reflux disease)   . Headache    migraines  . Heart murmur   . Herpes   .  Hypertension     Past Surgical History:  Procedure Laterality Date  . TUBAL LIGATION      No prescriptions prior to admission.   No Known Allergies  Social History  Substance Use Topics  . Smoking status: Current Some Day Smoker    Packs/day: 0.20    Types: Cigarettes  . Smokeless tobacco: Never Used     Comment:  ABOUT 2 CIGARETTES EVERY 2-3DAYS  . Alcohol use 1.2 oz/week    2 Cans of beer per week     Comment: Beer.    Family History  Problem Relation Age of Onset  . Cancer Mother   . Hypertension Mother   . Diabetes Mother   . Stroke Father      Review of Systems  Musculoskeletal: Positive for joint pain.  All other systems reviewed and are negative.   Objective:  Physical Exam  Constitutional: She is oriented to person, place, and time. She appears well-developed and well-nourished.  HENT:  Head: Normocephalic and atraumatic.  Eyes: EOM are normal. Pupils are equal, round, and reactive to light.  Neck: Normal range of motion. Neck supple.  Cardiovascular: Normal rate and regular rhythm.   Respiratory: Effort normal and breath sounds normal.  GI: Soft. Bowel sounds are normal.  Musculoskeletal:       Left hip: She exhibits decreased range of motion, decreased strength, tenderness and bony tenderness.  Neurological: She is alert and oriented to person, place, and time.  Skin: Skin is warm and dry.  Psychiatric: She has a normal mood and affect.    Vital signs in  last 24 hours:    Labs:   Estimated body mass index is 24.22 kg/m as calculated from the following:   Height as of 01/12/16: 5\' 3"  (1.6 m).   Weight as of 01/17/16: 62 kg (136 lb 11.2 oz).   Imaging Review Plain radiographs demonstrate moderate degenerative joint disease of the left hip(s). The bone quality appears to be excellent for age and reported activity level.  Assessment/Plan:  End stage arthritis, left hip(s)  The patient history, physical examination, clinical judgement of the  provider and imaging studies are consistent with end stage degenerative joint disease of the left hip(s) and total hip arthroplasty is deemed medically necessary. The treatment options including medical management, injection therapy, arthroscopy and arthroplasty were discussed at length. The risks and benefits of total hip arthroplasty were presented and reviewed. The risks due to aseptic loosening, infection, stiffness, dislocation/subluxation,  thromboembolic complications and other imponderables were discussed.  The patient acknowledged the explanation, agreed to proceed with the plan and consent was signed. Patient is being admitted for inpatient treatment for surgery, pain control, PT, OT, prophylactic antibiotics, VTE prophylaxis, progressive ambulation and ADL's and discharge planning.The patient is planning to be discharged home with home health services

## 2016-01-20 NOTE — Anesthesia Postprocedure Evaluation (Signed)
Anesthesia Post Note  Patient: Jean [name]  Procedure(s) Performed: Procedure(s) (LRB): LEFT TOTAL HIP ARTHROPLASTY ANTERIOR APPROACH (Left)  Patient location during evaluation: PACU Anesthesia Type: General Level of consciousness: awake and alert Pain management: pain level controlled Vital Signs Assessment: post-procedure vital signs reviewed and stable Respiratory status: spontaneous breathing, nonlabored ventilation, respiratory function stable and patient connected to nasal cannula oxygen Cardiovascular status: blood pressure returned to baseline and stable Postop Assessment: no signs of nausea or vomiting Anesthetic complications: no    Last Vitals:  Vitals:   01/20/16 1730 01/20/16 1745  BP: 115/70 130/82  Pulse: 62 67  Resp: 12 14  Temp:  36.7 C    Last Pain:  Vitals:   01/20/16 1730  TempSrc:   PainSc: Asleep                 Montez Hageman

## 2016-01-20 NOTE — Brief Op Note (Signed)
01/20/2016  4:32 PM  PATIENT:  Jean [name]  50 y.o. female  PRE-OPERATIVE DIAGNOSIS:  osteoarthritis left hip  [name]-OPERATIVE DIAGNOSIS:  osteoarthritis left hip  PROCEDURE:  Procedure(s): LEFT TOTAL HIP ARTHROPLASTY ANTERIOR APPROACH (Left)  SURGEON:  Surgeon(s) and Role:    * Mcarthur Rossetti, MD - Primary  PHYSICIAN ASSISTANT: [name], PA-C  ANESTHESIA:   general  EBL:  Total I/O In: 1000 [I.V.:1000] Out: 300 [Blood:300]  COUNTS:  YES  DICTATION: .Other Dictation: Dictation Number [id number]  PLAN OF CARE: Admit to inpatient   PATIENT DISPOSITION:  PACU - hemodynamically stable.   Delay start of Pharmacological VTE agent (>24hrs) due to surgical blood loss or risk of bleeding: no

## 2016-01-20 NOTE — Anesthesia Preprocedure Evaluation (Signed)
Anesthesia Evaluation  Patient identified by MRN, date of birth, ID band Patient awake    Reviewed: Allergy & Precautions, NPO status , Patient's Chart, lab work & pertinent test results  Airway Mallampati: II  TM Distance: >3 FB Neck ROM: Full    Dental no notable dental hx.    Pulmonary Current Smoker,    Pulmonary exam normal breath sounds clear to auscultation       Cardiovascular hypertension, Pt. on medications Normal cardiovascular exam Rhythm:Regular Rate:Normal     Neuro/Psych negative neurological ROS  negative psych ROS   GI/Hepatic negative GI ROS, Neg liver ROS,   Endo/Other  negative endocrine ROS  Renal/GU negative Renal ROS  negative genitourinary   Musculoskeletal negative musculoskeletal ROS (+)   Abdominal   Peds negative pediatric ROS (+)  Hematology negative hematology ROS (+)   Anesthesia Other Findings   Reproductive/Obstetrics negative OB ROS                            Anesthesia Physical Anesthesia Plan  ASA: II  Anesthesia Plan: General   Post-op Pain Management:    Induction: Intravenous  Airway Management Planned: Oral ETT  Additional Equipment:   Intra-op Plan:   Post-operative Plan: Extubation in OR  Informed Consent: I have reviewed the patients History and Physical, chart, labs and discussed the procedure including the risks, benefits and alternatives for the proposed anesthesia with the patient or authorized representative who has indicated his/her understanding and acceptance.   Dental advisory given  Plan Discussed with: CRNA  Anesthesia Plan Comments:         Anesthesia Quick Evaluation

## 2016-01-20 NOTE — Transfer of Care (Signed)
Immediate Anesthesia Transfer of Care Note  Patient: Jean [name]  Procedure(s) Performed: Procedure(s): LEFT TOTAL HIP ARTHROPLASTY ANTERIOR APPROACH (Left)  Patient Location: PACU  Anesthesia Type:General  Level of Consciousness:  sedated, patient cooperative and responds to stimulation  Airway & Oxygen Therapy:Patient Spontanous Breathing and Patient connected to face mask oxgen  Post-op Assessment:  Report given to PACU RN and Post -op Vital signs reviewed and stable  Post vital signs:  Reviewed and stable  Last Vitals:  Vitals:   01/20/16 1202  BP: 139/63  Pulse: (!) 56  Resp: 16  Temp: 123456 C    Complications: No apparent anesthesia complications

## 2016-01-20 NOTE — OR Nursing (Signed)
Foley catheter attempted twice per Orest Dikes, RN.

## 2016-01-20 NOTE — Anesthesia Procedure Notes (Signed)
Procedure Name: Intubation Date/Time: 01/20/2016 3:01 PM Performed by: Montel Clock Pre-anesthesia Checklist: Patient identified, Emergency Drugs available, Suction available, Patient being monitored and Timeout performed Patient Re-evaluated:Patient Re-evaluated prior to inductionOxygen Delivery Method: Circle system utilized Preoxygenation: Pre-oxygenation with 100% oxygen Intubation Type: IV induction Ventilation: Mask ventilation without difficulty Laryngoscope Size: Mac and 3 Grade View: Grade I Tube type: Oral Tube size: 7.0 mm Number of attempts: 1 Airway Equipment and Method: Stylet Placement Confirmation: ETT inserted through vocal cords under direct vision,  positive ETCO2 and breath sounds checked- equal and bilateral Secured at: 21 cm Tube secured with: Tape Dental Injury: Teeth and Oropharynx as per pre-operative assessment

## 2016-01-20 NOTE — Progress Notes (Signed)
Pt is nauseous. Vomited three times since 1900. Too soon to give Zofran and Reglan. Notified Dr. Sharol Given on call for Dr. [name] and received orders for phenergan. Will continue to monitor.

## 2016-01-21 LAB — BASIC METABOLIC PANEL
Anion gap: 8 (ref 5–15)
BUN: 17 mg/dL (ref 6–20)
CHLORIDE: 104 mmol/L (ref 101–111)
CO2: 24 mmol/L (ref 22–32)
Calcium: 8.7 mg/dL — ABNORMAL LOW (ref 8.9–10.3)
Creatinine, Ser: 1.13 mg/dL — ABNORMAL HIGH (ref 0.44–1.00)
GFR calc Af Amer: >60 mL/min (ref >60)
GFR, EST NON AFRICAN AMERICAN: 55 mL/min — AB (ref >60)
GLUCOSE: 191 mg/dL — AB (ref 65–99)
POTASSIUM: 4.5 mmol/L (ref 3.5–5.1)
Sodium: 136 mmol/L (ref 135–145)

## 2016-01-21 LAB — CBC
HCT: 31.1 % — ABNORMAL LOW (ref 36.0–46.0)
HEMOGLOBIN: 10.5 g/dL — AB (ref 12.0–15.0)
MCH: 32.3 pg (ref 26.0–34.0)
MCHC: 33.8 g/dL (ref 30.0–36.0)
MCV: 95.7 fL (ref 78.0–100.0)
PLATELETS: 277 10*3/uL (ref 150–400)
RBC: 3.25 MIL/uL — AB (ref 3.87–5.11)
RDW: 13.3 % (ref 11.5–15.5)
WBC: 14.8 10*3/uL — ABNORMAL HIGH (ref 4.0–10.5)

## 2016-01-21 MED ORDER — LIP MEDEX EX OINT
TOPICAL_OINTMENT | CUTANEOUS | Status: AC
  Administered 2016-01-21: 09:00:00
  Filled 2016-01-21: qty 7

## 2016-01-21 MED ORDER — CYCLOBENZAPRINE HCL 10 MG PO TABS
10.0000 mg | ORAL_TABLET | Freq: Three times a day (TID) | ORAL | Status: DC | PRN
  Administered 2016-01-21 – 2016-01-23 (×4): 10 mg via ORAL
  Filled 2016-01-21 (×4): qty 1

## 2016-01-21 MED ORDER — CALCIUM CARBONATE ANTACID 500 MG PO CHEW
2.0000 | CHEWABLE_TABLET | Freq: Two times a day (BID) | ORAL | Status: DC
  Administered 2016-01-21 – 2016-01-23 (×5): 400 mg via ORAL
  Filled 2016-01-21 (×5): qty 2

## 2016-01-21 NOTE — Progress Notes (Signed)
Physical Therapy Treatment Patient Details Name: DEAIRRA SPITLER MRN: [id number] DOB: 04-18-1966 Today's Date: 01/21/2016    History of Present Illness post L  THA through direct anterior approach on 01/20/16    PT Comments    The patient is progressing. Requires assistance to lift the Left leg on and off of the bed. The patient will benefit from [facility] consult for  Rehab. PLEASE CONSULT CIR.  Follow Up Recommendations   CIR, 24/ 7  Caregivers.     Equipment Recommendations    RW   Recommendations for Other Services       Precautions / Restrictions Precautions Precautions: Fall    Mobility  Bed Mobility Overal bed mobility: Modified Independent             General bed mobility comments: patient hooked  right  foot under  left to self mobilize legs onto bed  Transfers Overall transfer level: Needs assistance Equipment used: Rolling walker (2 wheeled) Transfers: Sit to/from Stand Sit to Stand: Min guard Stand pivot transfers: Min guard       General transfer comment: cues for hand and L leg position, cues for safety, to not reach for objects  Ambulation/Gait Ambulation/Gait assistance: Min assist Ambulation Distance (Feet): 100 Feet Assistive device: Rolling walker (2 wheeled) Gait Pattern/deviations: Step-to pattern;Step-through pattern     General Gait Details: cues for position inside Rw and sequence.  Cues to roll  the walker. Improved weight bearing on Left leg   Stairs            Wheelchair Mobility    Modified Rankin (Stroke Patients Only)       Balance                                    Cognition Arousal/Alertness: Awake/alert                          Exercises Total Joint Exercises Heel Slides: AAROM;Left;10 reps;Supine    General Comments        Pertinent Vitals/Pain Pain Score: 7  Pain Location: lt hip Pain Descriptors / Indicators: Aching;Tightness;Grimacing;Guarding Pain Intervention(s):  Repositioned;Ice applied;Premedicated before session;Monitored during session    Home Living                      Prior Function            PT Goals (current goals can now be found in the care plan section) Progress towards PT goals: Progressing toward goals    Frequency           PT Plan      Co-evaluation             End of Session           Time: [id number] PT Time Calculation (min) (ACUTE ONLY): 17 min  Charges:  $Gait Training: 8-22 mins                    G Codes:      Claretha Cooper 01/21/2016, 5:14 PM Tresa Endo PT [phone]

## 2016-01-21 NOTE — Progress Notes (Signed)
Patient ID: Jean [name], female   DOB: [birth year], 50 y.o.   MRN: [id number] Postoperative day 1  Left total hip arthroplasty. Patient is comfortable this morning. Discharged to home when safe with physical therapy.

## 2016-01-21 NOTE — Evaluation (Signed)
Physical Therapy Evaluation Patient Details Name: Jean [name] MRN: [id number] DOB: [birth year] Today's Date: 01/21/2016   History of Present Illness  post L  THA through direct anterior approach on 01/20/16  Clinical Impression  The patient tolerated ambulation after initially being hesitant to do so. Gradual increase in weight bearing on the left leg. With cues.  Pt admitted with above diagnosis. Pt currently with functional limitations due to the deficits listed below (see PT Problem List). Pt will benefit from skilled PT to increase their independence and safety with mobility to allow discharge to the venue listed below.       Follow Up Recommendations SNF;Supervision/Assistance - 24 hour    Equipment Recommendations  Rolling walker with 5" wheels    Recommendations for Other Services       Precautions / Restrictions Precautions Precautions: Fall      Mobility  Bed Mobility Overal bed mobility: Needs Assistance Bed Mobility: Supine to Sit     Supine to sit: Supervision     General bed mobility comments: patient hooked  right  foot under  left to self mobilize to sitting at the edge.  Transfers Overall transfer level: Needs assistance Equipment used: Rolling walker (2 wheeled) Transfers: Sit to/from Stand Sit to Stand: Min assist         General transfer comment: cues for hand and L leg position  Ambulation/Gait Ambulation/Gait assistance: Min assist Ambulation Distance (Feet): 100 Feet Assistive device: Rolling walker (2 wheeled) Gait Pattern/deviations: Step-to pattern;Decreased step length - left;Decreased stance time - left;Antalgic     General Gait Details: cues for position inside Rw and sequence. Initially not placing weight on the left leg, gradually increased to  PWB.   Stairs            Wheelchair Mobility    Modified Rankin (Stroke Patients Only)       Balance                                              Pertinent Vitals/Pain Pain Assessment: 0-10 Pain Score: 7  Pain Location: L hip and thigh Pain Descriptors / Indicators: Aching;Tightness Pain Intervention(s): Limited activity within patient's tolerance;Monitored during session;Premedicated before session;Repositioned;Ice applied    Home Living Family/patient expects to be discharged to:: Private residence Living Arrangements: Alone;Non-relatives/Friends Available Help at Discharge: Friend(s) Type of Home: House Home Access: Stairs to enter Entrance Stairs-Rails: Left Entrance Stairs-Number of Steps: 4 Home Layout: One level Home Equipment: None      Prior Function Level of Independence: Independent               Hand Dominance        Extremity/Trunk Assessment   Upper Extremity Assessment: Defer to OT evaluation           Lower Extremity Assessment: LLE deficits/detail   LLE Deficits / Details: able to advance the Left leg during swing     Communication   Communication: No difficulties  Cognition Arousal/Alertness: Awake/alert Behavior During Therapy: WFL for tasks assessed/performed Overall Cognitive Status: Within Functional Limits for tasks assessed                      General Comments      Exercises     Assessment/Plan    PT Assessment Patient needs continued PT services  PT Problem List Decreased strength;Decreased  range of motion;Decreased activity tolerance;Decreased mobility;Decreased knowledge of precautions;Decreased safety awareness;Decreased knowledge of use of DME          PT Treatment Interventions DME instruction;Gait training;Stair training;Functional mobility training;Therapeutic activities;Therapeutic exercise;Patient/family education    PT Goals (Current goals can be found in the Care Plan section)  Acute Rehab PT Goals Patient Stated Goal: to go to rehab PT Goal Formulation: With patient Time For Goal Achievement: 01/25/16 Potential to Achieve Goals:  Good    Frequency 7X/week   Barriers to discharge Decreased caregiver support      Co-evaluation               End of Session   Activity Tolerance: Patient tolerated treatment well Patient left: in chair;with call bell/phone within reach;with chair alarm set;with family/visitor present Nurse Communication: Mobility status         Time: [id number] PT Time Calculation (min) (ACUTE ONLY): 23 min   Charges:   PT Evaluation $PT Eval Low Complexity: 1 Procedure PT Treatments $Gait Training: 8-22 mins   PT G Codes:        Claretha Cooper 01/21/2016, 11:35 AM Tresa Endo PT [phone]

## 2016-01-21 NOTE — Op Note (Signed)
Jean [name]              ACCOUNT NO.:  [id number]  MEDICAL RECORD NO.:  [id number]  LOCATION:                                 FACILITY:  PHYSICIAN:  Lind Guest. [name], M.D.DATE OF BIRTH:  Jean [name]  DATE OF PROCEDURE:  01/20/2016 DATE OF DISCHARGE:                              OPERATIVE REPORT   PREOPERATIVE DIAGNOSIS:  Primary osteoarthritis, degenerative joint disease, left hip.  POSTOPERATIVE DIAGNOSES:  Primary osteoarthritis, degenerative joint disease, left hip.  PROCEDURE:  Left total hip arthroplasty direct anterior approach.  IMPLANTS:  DePuy Sector Gription acetabular component size 50, size 32+ 0 polyethylene liner, size 11, Corail femoral component with varus offset, size 32+ 1 ceramic hip ball.  ASSISTANT:  Erskine Emery, P.A.  ANESTHESIA:  General.  BLOOD LOSS:  300 mL.  COMPLICATIONS:  None.  INDICATIONS:  Jean [name] is a 50 year old female with moderate arthritis of her left hip.  This was confirmed with MRI and then very painful to her.  It caused debilitating pain to the point she is requiring narcotics but we are not going to need to keep her on those.  She has tried and failed all forms of conservative treatment with involvement of her left hip, and the MRI does show she has moderate arthritis.  At this point, she is ambulating with crutches.  She is saying her pain is so severe that she does wish to proceed with a total hip arthroplasty.  She understands the risk of acute blood loss anemia, nerve and vessel injury, fracture, infection, dislocation, DVT.  She understands our goals are decreased pain, improved mobility, and overall improved quality of life.  PROCEDURE DESCRIPTION:  After informed consent was obtained, appropriate left hip was marked.  She was brought to the operating room.  General anesthesia was obtained while she was on her stretcher.  She was then placed supine on the Hana fracture table with the perineal post in  place and both legs in inline skeletal traction devices and boots and no traction applied.  His left operative hip was prepped and draped with DuraPrep and sterile drapes.  Time-out was called to identify correct patient, correct left hip.  We then made an incision inferior and posterior to the anterior superior iliac spine and carried this obliquely down the leg.  We dissected down tensor fascia lata muscle. The tensor fascia was then divided longitudinally, so we could proceed with a direct anterior approach to the hip.  We identified and cauterized the circumflex vessels and then identified the hip capsule. I opened up the hip capsule in an L-type format finding a moderate joint effusion.  There were minimal preop articular osteophytes.  We placed Cobra retractors within the joint capsule.  We made our femoral neck cut with an oscillating saw and completed with an osteotome.  We placed a corkscrew guide in the femoral head removed the femoral head in its entirety.  We counseled small areas with significant cartilage changes. We did see degenerative tearing of the labrum.  We placed a bent Hohmann over the medial acetabular rim and then removed remnants of the ray blade with sharp knife.  I then began reaming under direct visualization  from a size 42 reamer up to a size 50 with the last reamer also placed under direct fluoroscopy, so we could obtain our depth of reaming, our inclination, and anteversion.  Once we were pleased with this, we placed the real DePuy Sector Gription acetabular component size 50 and a 32+ 0 neutral polyethylene liner for that size acetabular component. Attention was then turned to the femur.  With the leg externally rotated to 100 degrees, extended abduct and we were able to place a Mueller retractor medially and a Hohmann retractor behind the greater trochanter, we released lateral joint capsule and used a box cutting osteotome in the femoral canal and a  rongeur lateralize.  I then began broaching with the Corail broaching system from a size 8 broach up to a size 11.  With the size 11, we trialed a varus offset femoral neck and a 32+ 1 hip ball.  We brought the leg back over and up with traction and rotation reducing the pelvis.  We were pleased with leg length, offset, range of motion.  We then dislocated the hip and removed the trial components.  Then, we were able to place the real components without difficulty.  We then irrigated the soft tissue normal saline solution using pulsatile lavage.  We closed the joint capsule interrupted #1 Ethibond suture followed by running #1 Vicryl the tensor fascia, 0 Vicryl in the deep tissue, 2-0 Vicryl in the subcutaneous tissue, 4-0 Monocryl subcuticular stitch and Steri-Strips on the skin.  An Aquacel dressing was applied.  She was taken off the Hana table, awakened, extubated, and taken to recovery room in stable condition.  All final counts were correct.  There were no complications noted.  Of note, Erskine Emery, PA-C assisted in the entire case.  His assistance was crucial for facilitating all aspects of this case.     Lind Guest. [name], M.D.   ______________________________ Lind Guest. [name], M.D.    CYB/MEDQ  D:  01/20/2016  T:  01/21/2016  Job:  [id number]

## 2016-01-21 NOTE — Evaluation (Signed)
Occupational Therapy Evaluation Patient Details Name: Jean [name] MRN: [id number] DOB: May 16, 1965 Today's Date: 01/21/2016    History of Present Illness post L  THJA through direct anterior approach on 01/20/16   Clinical Impression   Pt admitted with above. She demonstrates the below listed deficits and will benefit from continued OT to maximize safety and independence with BADLs.  Pt limited by pain this date.  She requires mod A for ADLs and min A for functional mobility.  She lives alone with minimal supports.  Recommend SNF at discharge.       Follow Up Recommendations  SNF;Supervision/Assistance - 24 hour    Equipment Recommendations  3 in 1 bedside comode;Tub/shower bench    Recommendations for Other Services       Precautions / Restrictions Precautions Precautions: Fall Restrictions Weight Bearing Restrictions: No      Mobility Bed Mobility Overal bed mobility: Needs Assistance Bed Mobility: Supine to Sit     Supine to sit: Supervision     General bed mobility comments: patient hooked  right  foot under  left to self mobilize to sitting at the edge.  Transfers Overall transfer level: Needs assistance Equipment used: Rolling walker (2 wheeled) Transfers: Sit to/from [facility] Sit to Stand: Min assist Stand pivot transfers: Min assist       General transfer comment: cues for hand and L leg position    Balance                                            ADL Overall ADL's : Needs assistance/impaired Eating/Feeding: Independent   Grooming: Wash/dry hands;Wash/dry face;Oral care;Brushing hair;Set up;Sitting   Upper Body Bathing: Set up;Sitting   Lower Body Bathing: Sit to/from stand;Moderate assistance   Upper Body Dressing : Set up;Sitting   Lower Body Dressing: Moderate assistance;Sit to/from stand   Toilet Transfer: Minimal assistance;Stand-pivot;BSC;RW   Toileting- Clothing Manipulation and  Hygiene: Minimal assistance;Sit to/from stand       Functional mobility during ADLs: Minimal assistance;Rolling walker General ADL Comments: Pt unable to access Lt LE due to pain.  She was instructed in use of AE, and was able to don/doff socks and slippers with superivsion and simulated donning pants with min A      Vision     Perception     Praxis      Pertinent Vitals/Pain Pain Assessment: 0-10 Pain Score: 6  Pain Location: Lt hip  Pain Descriptors / Indicators: Aching;Spasm Pain Intervention(s): Monitored during session;Repositioned;Ice applied     Hand Dominance Right   Extremity/Trunk Assessment Upper Extremity Assessment Upper Extremity Assessment: Overall WFL for tasks assessed   Lower Extremity Assessment Lower Extremity Assessment: Defer to PT evaluation LLE Deficits / Details: able to advance the Left leg during swing   Cervical / Trunk Assessment Cervical / Trunk Assessment: Normal;Other exceptions Cervical / Trunk Exceptions: has osteophyte in thoracic spine    Communication Communication Communication: No difficulties   Cognition Arousal/Alertness: Awake/alert Behavior During Therapy: WFL for tasks assessed/performed (Pt tearful at times ) Overall Cognitive Status: Within Functional Limits for tasks assessed                     General Comments       Exercises       Shoulder Instructions      Home Living Family/patient expects to  be discharged to:: Private residence Living Arrangements: Alone;Non-relatives/Friends Available Help at Discharge: Family;Friend(s);Available PRN/intermittently Type of Home: House Home Access: Stairs to enter [employer] of Steps: 4 Entrance Stairs-Rails: Left Home Layout: One level     Bathroom Shower/Tub: Tub/shower unit Shower/tub characteristics: Architectural technologist: Standard     Home Equipment: None          Prior Functioning/Environment Level of Independence: Needs  assistance  Gait / Transfers Assistance Needed: Pt reports she has had to rely on use of SPC recently when ambulating  ADL's / Homemaking Assistance Needed: Pt reports she has had increasing difficulty with LB ADLs.  She mostly wears dresses and flip flops and daughter assists her when able.  SHe relies on a friend for transportation.   Communication / Swallowing Assistance Needed: Pt is a CNA and worked as a Gaffer Problem List: Decreased strength;Decreased activity tolerance;Decreased safety awareness;Decreased knowledge of use of DME or AE;Pain   OT Treatment/Interventions: Self-care/ADL training;DME and/or AE instruction;Therapeutic activities;Patient/family education;Balance training    OT Goals(Current goals can be found in the care plan section) Acute Rehab OT Goals Patient Stated Goal: to go to rehab OT Goal Formulation: With patient Time For Goal Achievement: 02/04/16 Potential to Achieve Goals: Good ADL Goals Pt Will Perform Grooming: with supervision;standing Pt Will Perform Lower Body Bathing: with supervision;with adaptive equipment;sit to/from stand Pt Will Perform Lower Body Dressing: with supervision;with adaptive equipment;sit to/from stand Pt Will Transfer to Toilet: with supervision;ambulating;regular height toilet;bedside commode;grab bars Pt Will Perform Toileting - Clothing Manipulation and hygiene: with supervision;sit to/from stand Pt Will Perform Tub/Shower Transfer: ambulating;tub bench;rolling walker;with min guard assist  OT Frequency: Min 2X/week   Barriers to D/C: Decreased caregiver support          Co-evaluation              End of Session Equipment Utilized During Treatment: Rolling walker Nurse Communication: Mobility status  Activity Tolerance: Patient limited by pain Patient left: in bed;with call bell/phone within reach;with family/visitor present   Time: [id number] OT Time Calculation (min): 28 min Charges:   OT General Charges $OT Visit: 1 Procedure OT Evaluation $OT Eval Moderate Complexity: 1 Procedure OT Treatments $Self Care/Home Management : 8-22 mins G-Codes:    Daviyon Widmayer M January 27, 2016, 1:35 PM

## 2016-01-21 NOTE — Op Note (Deleted)
  The note originally documented on this encounter has been moved the the encounter in which it belongs.  

## 2016-01-22 NOTE — Progress Notes (Signed)
Occupational Therapy Treatment Patient Details Name: Jean [name] MRN: [id number] DOB: 1966-05-06 Today's Date: 01/22/2016    History of present illness post L  THA through direct anterior approach on 01/20/16   OT comments  Patient progressing towards OT goals; however, she would benefit from short rehab stay to maximize safety and function at home as she lives alone with no 24/7 A. OT will continue to follow.   Follow Up Recommendations  CIR;Supervision/Assistance - 24 hour    Equipment Recommendations  3 in 1 bedside comode;Tub/shower bench    Recommendations for Other Services      Precautions / Restrictions Precautions Precautions: Fall Restrictions Weight Bearing Restrictions: No Other Position/Activity Restrictions: WBAT       Mobility Bed Mobility Overal bed mobility: Modified Independent Bed Mobility: Supine to Sit;Sit to Supine     Supine to sit: Modified independent (Device/Increase time) Sit to supine: Modified independent (Device/Increase time)   General bed mobility comments: using bed sheet to move LLE in/out of bed  Transfers Overall transfer level: Needs assistance Equipment used: Rolling walker (2 wheeled) Transfers: Sit to/from [facility] Sit to Stand: Min guard Stand pivot transfers: Min guard       General transfer comment: cues for safety    Balance                                   ADL Overall ADL's : Needs assistance/impaired                     Lower Body Dressing: Moderate assistance;Sit to/from stand   Toilet Transfer: Min guard;Stand-pivot;BSC;RW   Toileting- Water quality scientist and Hygiene: Minimal assistance;Sit to/from stand       Functional mobility during ADLs: Min guard;Rolling walker General ADL Comments: Patient using bed sheet today to move LLE in/out of bed. Issued patient AE for LB self-care; she declined to practice again stating she remembers  how to use from  yesterday's session. Patient practiced [facility] transfer/toileting during session then back to bed.      Vision                     Perception     Praxis      Cognition   Behavior During Therapy: WFL for tasks assessed/performed Overall Cognitive Status: Within Functional Limits for tasks assessed                       Extremity/Trunk Assessment               Exercises     Shoulder Instructions       General Comments      Pertinent Vitals/ Pain       Pain Assessment: 0-10 Pain Score: 7  Pain Location: L hip Pain Descriptors / Indicators: Grimacing;Guarding;Sore Pain Intervention(s): Monitored during session;Premedicated before session;Repositioned;Ice applied  Home Living Family/patient expects to be discharged to:: Unsure Living Arrangements: Alone;Non-relatives/Friends Available Help at Discharge: Family;Friend(s);Available PRN/intermittently Type of Home: House Home Access: Stairs to enter [employer] of Steps: 4 Entrance Stairs-Rails: Left Home Layout: One level     Bathroom Shower/Tub: Tub/shower unit Shower/tub characteristics: Architectural technologist: Standard     Home Equipment: None          Prior Functioning/Environment Level of Independence: Needs assistance  Gait / Transfers Assistance Needed: Pt reports she has had to rely on  use of SPC recently when ambulating  ADL's / Homemaking Assistance Needed: Pt reports she has had increasing difficulty with LB ADLs.  She mostly wears dresses and flip flops and daughter assists her when able.  SHe relies on a friend for transportation.   Communication / Swallowing Assistance Needed: Pt is a CNA and worked as a Teacher, adult education 2X/week        [place]  OT Goals(current goals can now be found in the care plan section)  Progress towards OT goals: Progressing toward goals  Acute Rehab OT Goals Patient Stated Goal: to go to rehab   Plan Discharge plan needs to be updated    Co-evaluation                 End of Session Equipment Utilized During Treatment: Rolling walker   Activity Tolerance Patient tolerated treatment well   Patient Left in bed;with call bell/phone within reach   Nurse Communication          Time: [id number] OT Time Calculation (min): 12 min  Charges: OT General Charges $OT Visit: 1 Procedure OT Treatments $Self Care/Home Management : 8-22 mins  Robertta Halfhill A 01/22/2016, 11:32 AM

## 2016-01-22 NOTE — Progress Notes (Signed)
Physical Therapy Treatment Patient Details Name: Jean [name] MRN: [id number] DOB: 10/07/1965 Today's Date: 01/22/2016    History of Present Illness post L  THA through direct anterior approach on 01/20/16    PT Comments    POD # 2 am session Assisted OOB to amb a limited distance then back to bed per pt request...  Follow Up Recommendations  CIR;Supervision/Assistance - 24 hour     Equipment Recommendations  Rolling walker with 5" wheels    Recommendations for Other Services       Precautions / Restrictions Precautions Precautions: Fall Restrictions Weight Bearing Restrictions: No Other Position/Activity Restrictions: WBAT    Mobility  Bed Mobility Overal bed mobility: Modified Independent Bed Mobility: Supine to Sit;Sit to Supine     Supine to sit: Modified independent (Device/Increase time) Sit to supine: Modified independent (Device/Increase time)   General bed mobility comments: using bed sheet to move LLE in/out of bed  Transfers Overall transfer level: Needs assistance Equipment used: Rolling walker (2 wheeled) Transfers: Sit to/from Stand Sit to Stand: Supervision;Min guard Stand pivot transfers: Min guard       General transfer comment: cues for safety and increased time  Ambulation/Gait Ambulation/Gait assistance: Min assist Ambulation Distance (Feet): 57 Feet Assistive device: Rolling walker (2 wheeled) Gait Pattern/deviations: Step-to pattern;Decreased stance time - left Gait velocity: decreased   General Gait Details: cues for position inside Rw and sequence.  Cues to roll  the walker.  required increased time.   Stairs            Wheelchair Mobility    Modified Rankin (Stroke Patients Only)       Balance                                    Cognition Arousal/Alertness: Awake/alert Behavior During Therapy: WFL for tasks assessed/performed Overall Cognitive Status: Within Functional Limits for tasks  assessed                      Exercises      General Comments        Pertinent Vitals/Pain Pain Assessment: No/denies pain Pain Score: 8  Pain Location: L hip Pain Descriptors / Indicators: Grimacing;Operative site guarding;Tender Pain Intervention(s): Monitored during session;Repositioned;Ice applied    Home Living                      Prior Function            PT Goals (current goals can now be found in the care plan section) Progress towards PT goals: Progressing toward goals    Frequency    7X/week      PT Plan Discharge plan needs to be updated    Co-evaluation             End of Session Equipment Utilized During Treatment: Gait belt Activity Tolerance: Patient tolerated treatment well Patient left: in chair;with call bell/phone within reach;with chair alarm set;with family/visitor present     Time: [id number] PT Time Calculation (min) (ACUTE ONLY): 19 min  Charges:  $Gait Training: 8-22 mins                    G Codes:      Rica Koyanagi  PTA [facility]  Acute  Rehab Pager      [phone]

## 2016-01-22 NOTE — Care Management Note (Signed)
Case Management Note  Patient Details  Name: Jean [name] MRN: [id number] Date of Birth: 06-Feb-1966  Subjective/Objective:    Left THA                Action/Plan: Discharge Planning:  NCM spoke to pt and states she does not have support at home. States she is currently living with friends. She lost her job due to her health and pain in her hips. Pt states she is with [facility] and [facility]. States she will need 3n1 and RW for home. PT recommending IP rehab vs SNF. CSW referral for SNF.    Expected Discharge Date:                 Expected Discharge Plan:  [name]  In-House Referral:  Clinical Social Work  Discharge planning Services  CM Consult  Post Acute Care Choice:  NA Choice offered to:  NA   Status of Service:  In process, will continue to follow  If discussed at Long Length of Stay Meetings, dates discussed:    Additional Comments:  Erenest Rasher, RN 01/22/2016, 10:27 AM

## 2016-01-22 NOTE — Progress Notes (Signed)
Physical Therapy Treatment Patient Details Name: Jean [name] MRN: [id number] DOB: 09/14/1965 Today's Date: 01/22/2016    History of Present Illness post L  THA through direct anterior approach on 01/20/16    PT Comments    POD # 2 pm session Performed THR TE's while in bed  Follow Up Recommendations  CIR;Supervision/Assistance - 24 hour     Equipment Recommendations  Rolling walker with 5" wheels    Recommendations for Other Services       Precautions / Restrictions Precautions Precautions: Fall Restrictions Weight Bearing Restrictions: No Other Position/Activity Restrictions: WBAT          Cognition Arousal/Alertness: Awake/alert Behavior During Therapy: WFL for tasks assessed/performed Overall Cognitive Status: Within Functional Limits for tasks assessed                      Exercises   Total Hip Replacement TE's 10 reps ankle pumps 10 reps knee presses 10 reps heel slides 10 reps SAQ's 10 reps ABD Followed by ICE     General Comments        Pertinent Vitals/Pain Pain Assessment: No/denies pain Pain Score: 8  Pain Location: L hip Pain Descriptors / Indicators: Grimacing;Operative site guarding;Tender Pain Intervention(s): Monitored during session;Repositioned;Ice applied    Home Living                      Prior Function            PT Goals (current goals can now be found in the care plan section) Progress towards PT goals: Progressing toward goals    Frequency    7X/week      PT Plan Discharge plan needs to be updated    Co-evaluation             End of Session Equipment Utilized During Treatment: Gait belt Activity Tolerance: Patient tolerated treatment well Patient left: in chair;with call bell/phone within reach;with chair alarm set;with family/visitor present     Time: [id number] PT Time Calculation (min) (ACUTE ONLY): 16 min  Charges:   $Therapeutic Exercise: 8-22 mins                    G  Codes:      Rica Koyanagi  PTA [facility]  Acute  Rehab Pager      [phone]

## 2016-01-22 NOTE — Progress Notes (Signed)
Patient ID: Jean [name], female   DOB: [birth year], 50 y.o.   MRN: [id number] Patient is making slow steady progress with physical therapy. Plan for discharge to home tomorrow. Patient has no complaints today.

## 2016-01-23 ENCOUNTER — Encounter (HOSPITAL_COMMUNITY): Payer: Self-pay | Admitting: Orthopaedic Surgery

## 2016-01-23 MED ORDER — ASPIRIN 325 MG PO TBEC: 325.0000 mg | DELAYED_RELEASE_TABLET | Freq: Every day | ORAL | 0 refills | Status: DC

## 2016-01-23 MED ORDER — TIZANIDINE HCL 4 MG PO TABS: 4.0000 mg | ORAL_TABLET | Freq: Four times a day (QID) | ORAL | 0 refills | Status: DC | PRN

## 2016-01-23 MED ORDER — OXYCODONE-ACETAMINOPHEN 5-325 MG PO TABS: 1.0000 | ORAL_TABLET | ORAL | 0 refills | Status: DC | PRN

## 2016-01-23 NOTE — Progress Notes (Signed)
Physical Therapy Treatment Patient Details Name: Jean [name] MRN: [id number] DOB: [birth year] Today's Date: 01/23/2016    History of Present Illness post L  THA through direct anterior approach on 01/20/16    PT Comments    The patient will DC to home. Provided leg lifter  And HEP. Practiced steps with crutch and a cane. Ready for Dc today.  Follow Up Recommendations  Home health PT;Supervision/Assistance - 24 hour     Equipment Recommendations  Rolling walker with 5" wheels    Recommendations for Other Services       Precautions / Restrictions Precautions Precautions: Fall Restrictions Weight Bearing Restrictions: No Other Position/Activity Restrictions: WBAT    Mobility  Bed Mobility Overal bed mobility: Modified Independent Bed Mobility: Supine to Sit;Sit to Supine     Supine to sit: Modified independent (Device/Increase time) Sit to supine: Modified independent (Device/Increase time)   General bed mobility comments: provided leg lifter to self assist.  Transfers Overall transfer level: Needs assistance Equipment used: Rolling walker (2 wheeled)   Sit to Stand: Modified independent (Device/Increase time) Stand pivot transfers: Min assist       General transfer comment: cues for safety and increased time  Ambulation/Gait Ambulation/Gait assistance: Supervision Ambulation Distance (Feet): 57 Feet Assistive device: Rolling walker (2 wheeled) Gait Pattern/deviations: Step-to pattern;Antalgic Gait velocity: decreased   General Gait Details: cues for position inside Rw and sequence.  Cues to roll  the walker.  required increased time.   Stairs Stairs: Yes Stairs assistance: Min assist Stair Management: One rail Left;Step to pattern;Forwards;With crutches;With cane Number of Stairs: 3 General stair comments: practiced with cane then Crutch and rail . crutch more supportive  Wheelchair Mobility    Modified Rankin (Stroke Patients Only)        Balance                                    Cognition Arousal/Alertness: Awake/alert Behavior During Therapy: WFL for tasks assessed/performed Overall Cognitive Status: Within Functional Limits for tasks assessed                      Exercises      General Comments        Pertinent Vitals/Pain Pain Score: 4  Pain Location: L hip Pain Descriptors / Indicators: Guarding Pain Intervention(s): Monitored during session;Premedicated before session;Repositioned;Ice applied    Home Living                      Prior Function            PT Goals (current goals can now be found in the care plan section) Progress towards PT goals: Progressing toward goals    Frequency    7X/week      PT Plan Discharge plan needs to be updated    Co-evaluation             End of Session   Activity Tolerance: Patient tolerated treatment well Patient left: in bed;with call bell/phone within reach;with family/visitor present     Time: 1203-1230 PT Time Calculation (min) (ACUTE ONLY): 27 min  Charges:  $Gait Training: 8-22 mins $Self Care/Home Management: 8-22                    G Codes:      Claretha Cooper 01/23/2016, 2:04 PM

## 2016-01-23 NOTE — Progress Notes (Signed)
I reviewed pt's therapy assessments as well as medical follow up. Pt lacks the medical necesity for an inpt hospital rehab admission. Recommend SNF rehab at this time if pt lacks the social supports for a d/c home. I will alert SW. [phone]

## 2016-01-23 NOTE — Progress Notes (Addendum)
CSW consulted for SNF placement. [facility] is unable to assist with acute rehab placement. Pt is unable to pay out of pocket for SNF. [facility] is unable to offer LOG at this time. Pt / MD are aware and in agreement with plan for d/c home with [facility] services. RNCM will assist with d/c planning.  Werner Lean LCSW [phone]

## 2016-01-23 NOTE — Discharge Summary (Signed)
Patient ID: Jean [name] MRN: [id number] DOB/AGE: 05/29/1965 50 y.o.  Admit date: 01/20/2016 Discharge date: 01/23/2016  Admission Diagnoses:  Principal Problem:   Osteoarthritis of left hip Active Problems:   Status post left hip replacement   Discharge Diagnoses:  Same  Past Medical History:  Diagnosis Date  . Arthritis   . Bronchitis    states at least 2-3 months ago  . Carpal tunnel syndrome    bilateral  . Depression   . GERD (gastroesophageal reflux disease)   . Headache    migraines  . Heart murmur   . Herpes   . Hypertension     Surgeries: Procedure(s): LEFT TOTAL HIP ARTHROPLASTY ANTERIOR APPROACH on 01/20/2016   Consultants:   Discharged Condition: Improved  Hospital Course: Jean [name] is an 50 y.o. female who was admitted 01/20/2016 for operative treatment ofOsteoarthritis of left hip. Patient has severe unremitting pain that affects sleep, daily activities, and work/hobbies. After pre-op clearance the patient was taken to the operating room on 01/20/2016 and underwent  Procedure(s): LEFT TOTAL HIP ARTHROPLASTY ANTERIOR APPROACH.    Patient was given perioperative antibiotics: Anti-infectives    Start     Dose/Rate Route Frequency Ordered Stop   01/21/16 0200  vancomycin (VANCOCIN) IVPB 1000 mg/200 mL premix     1,000 mg 200 mL/hr over 60 Minutes Intravenous Every 12 hours 01/20/16 1802 01/21/16 0340   01/20/16 1445  vancomycin (VANCOCIN) IVPB 1000 mg/200 mL premix     1,000 mg 200 mL/hr over 60 Minutes Intravenous  Once 01/20/16 1437 01/20/16 1538   01/20/16 1156  ceFAZolin (ANCEF) IVPB 2g/100 mL premix     2 g 200 mL/hr over 30 Minutes Intravenous On call to O.R. 01/20/16 1156 01/20/16 1524       Patient was given sequential compression devices, early ambulation, and chemoprophylaxis to prevent DVT.  Patient benefited maximally from hospital stay and there were no complications.    Recent vital signs: Patient Vitals for the past 24  hrs:  BP Temp Temp src Pulse Resp SpO2  01/23/16 0559 103/71 100 F (37.8 C) Oral 93 15 97 %  01/22/16 2303 120/66 99.8 F (37.7 C) Oral 98 15 94 %  01/22/16 1544 125/70 99.6 F (37.6 C) Oral 86 15 100 %     Recent laboratory studies:  Recent Labs  01/21/16 0421  WBC 14.8*  HGB 10.5*  HCT 31.1*  PLT 277  NA 136  K 4.5  CL 104  CO2 24  BUN 17  CREATININE 1.13*  GLUCOSE 191*  CALCIUM 8.7*       Diagnostic Studies: Dg C-arm 1-60 Min-no Report  Result Date: 01/20/2016 CLINICAL DATA: surgery C-ARM 1-60 MINUTES Fluoroscopy was utilized by the requesting physician.  No radiographic interpretation.   Dg Hip Port Unilat With Pelvis 1v Left  Result Date: 01/20/2016 CLINICAL DATA:  Left hip replacement, anterior approach. EXAM: DG HIP (WITH OR WITHOUT PELVIS) 1V PORT LEFT COMPARISON:  01/20/2016 intraoperative spot images. FINDINGS: There is some faint linear lucency are for the lower part of the lesser trochanter but given the scattered gas densities in the vicinity, I suspect that this represents an overlying gas density as there is no cortical discontinuity to suggest a nondisplaced fracture. Femoral component of the prosthesis appears well seated. Normal alignment between the acetabular shell component and femoral head. No complicating features identified. IMPRESSION: 1. No discrete fracture identified. Faint linear lucency tracking over the lower part of the lesser trochanter on the  frontal projection is adjacent to other linear soft tissue lucencies and is thought to probably represent gas in the soft tissues and not a nondisplaced fracture. Electronically Signed   By: Van Clines M.D.   On: 01/20/2016 17:03   Dg Hip Operative Unilat W Or W/o Pelvis Left  Result Date: 01/20/2016 CLINICAL DATA:  Postop EXAM: OPERATIVE left HIP (WITH PELVIS IF PERFORMED) 4 VIEWS TECHNIQUE: Fluoroscopic spot image(s) were submitted for interpretation post-operatively. COMPARISON:  09/07/2015  FINDINGS: Four views submitted for interpretation. Mild degenerative changes pubic symphysis. There is left hip prosthesis with anatomic alignment. IMPRESSION: Left hip prosthesis with anatomic alignment. Fluoroscopy time was 29 seconds seconds.  Patient's dose 2.21 mGy Electronically Signed   By: Lahoma Crocker M.D.   On: 01/20/2016 16:31    Disposition: Inpatient Rehab vs Skilled Nursing  Discharge Instructions    Call MD / Call 911    Complete by:  As directed    If you experience chest pain or shortness of breath, CALL 911 and be transported to the hospital emergency room.  If you develope a fever above 101 F, pus (white drainage) or increased drainage or redness at the wound, or calf pain, call your surgeon's office.   Constipation Prevention    Complete by:  As directed    Drink plenty of fluids.  Prune juice may be helpful.  You may use a stool softener, such as Colace (over the counter) 100 mg twice a day.  Use MiraLax (over the counter) for constipation as needed.   Diet - low sodium heart healthy    Complete by:  As directed    Discharge patient    Complete by:  As directed    Increase activity slowly as tolerated    Complete by:  As directed       Follow-up Information    Mcarthur Rossetti, MD Follow up in 2 week(s).   Specialty:  Orthopedic Surgery Contact information: [address] [place] [address] [phone]            Signed: Mcarthur Rossetti 01/23/2016, 7:24 AM

## 2016-01-23 NOTE — Progress Notes (Signed)
Patient ID: Jean [name], female   DOB: 1965-10-09, 50 y.o.   MRN: [id number] No acute changes.  Left hip stable.  Vitals stable.  Can be discharged today.  Therapy is recommending Inpatient Rehab vs Skilled Nursing.

## 2016-01-23 NOTE — Progress Notes (Signed)
Occupational Therapy Treatment Patient Details Name: THERISA TREASURE MRN: [id number] DOB: [birth year] Today's Date: 01/23/2016    History of present illness post L  THA through direct anterior approach on 01/20/16   OT comments  Pt feels ready to DC with sister A  Follow Up Recommendations  Home health OT    Equipment Recommendations  3 in 1 bedside comode;Tub/shower bench    Recommendations for Other Services      Precautions / Restrictions Precautions Precautions: Fall Restrictions Weight Bearing Restrictions: No Other Position/Activity Restrictions: WBAT       Mobility Bed Mobility Overal bed mobility: Modified Independent Bed Mobility: Supine to Sit;Sit to Supine     Supine to sit: Modified independent (Device/Increase time) Sit to supine: Modified independent (Device/Increase time)   General bed mobility comments: provided leg lifter to self assist.  Transfers Overall transfer level: Needs assistance Equipment used: Rolling walker (2 wheeled) Transfers: Sit to/from Stand Sit to Stand: Modified independent (Device/Increase time) Stand pivot transfers: Supervision       General transfer comment: cues for safety and increased time    Balance                                   ADL Overall ADL's : Needs assistance/impaired             Lower Body Bathing: Minimal assistance;Sit to/from stand;Cueing for sequencing;Cueing for compensatory techniques           Toilet Transfer: Supervision/safety;RW;Ambulation;Cueing for sequencing;Cueing for safety;Comfort height toilet   Toileting- Clothing Manipulation and Hygiene: Supervision/safety;Sit to/from stand;Cueing for safety;Cueing for sequencing         General ADL Comments: Pt verbalized and demonstrated use of AE for dressing. Pt able to verbalize technique for dressing as well as safety with ADL activity. sister will A as needed per pt                Cognition   Behavior  During Therapy: WFL for tasks assessed/performed Overall Cognitive Status: Within Functional Limits for tasks assessed                               General Comments      Pertinent Vitals/ Pain       Pain Score: 3  Pain Location: L hip Pain Descriptors / Indicators: Sore Pain Intervention(s): Monitored during session         Frequency  Min 2X/week        Progress Toward Goals  OT Goals(current goals can now be found in the care plan section)  Progress towards OT goals: Progressing toward goals     Plan Discharge plan remains appropriate       End of Session Equipment Utilized During Treatment: Rolling walker   Activity Tolerance Patient tolerated treatment well   Patient Left in bed;with call bell/phone within reach   Nurse Communication Mobility status        Time: [id number] OT Time Calculation (min): 10 min  Charges: OT General Charges $OT Visit: 1 Procedure OT Treatments $Self Care/Home Management : 8-22 mins  Allanna Bresee, Thereasa Parkin 01/23/2016, 2:38 PM

## 2016-01-23 NOTE — Discharge Instructions (Signed)

## 2016-01-23 NOTE — Progress Notes (Signed)
RN reviewed discharge paperwork with patient. All questions answered.   Paperwork and prescriptions given.  NT rolled patient down to family car.

## 2016-01-23 NOTE — Progress Notes (Signed)
Occupational Therapy Treatment Patient Details Name: Jean [name] MRN: [id number] DOB: 1965/09/15 Today's Date: 01/23/2016    History of present illness post L  THA through direct anterior approach on 01/20/16   OT comments  Going home today with [facility]- will see pt again this afternoon  Follow Up Recommendations  Supervision/Assistance - 24 hour;Home health OT    Equipment Recommendations  3 in 1 bedside comode;Tub/shower bench    Recommendations for Other Services      Precautions / Restrictions Precautions Precautions: Fall Restrictions Weight Bearing Restrictions: No Other Position/Activity Restrictions: WBAT       Mobility Bed Mobility Overal bed mobility: Modified Independent Bed Mobility: Supine to Sit;Sit to Supine     Supine to sit: Modified independent (Device/Increase time) Sit to supine: Modified independent (Device/Increase time)   General bed mobility comments: using bed sheet to move LLE in/out of bed  Transfers Overall transfer level: Needs assistance Equipment used: Rolling walker (2 wheeled)   Sit to Stand: Supervision;Min guard Stand pivot transfers: Min assist       General transfer comment: cues for safety and increased time    Balance                                   ADL Overall ADL's : Needs assistance/impaired             Lower Body Bathing: Minimal assistance;Sit to/from stand;Cueing for sequencing;Cueing for compensatory techniques           Toilet Transfer: Minimal assistance;Comfort height toilet;RW;Ambulation;Cueing for sequencing;Cueing for safety   Toileting- Clothing Manipulation and Hygiene: Minimal assistance;Sit to/from stand;Cueing for sequencing;Cueing for compensatory techniques;Cueing for safety         General ADL Comments: Discussed options for the tub- pt is interested in  a tub bench- relayed to [place]    Behavior During Therapy: [facility] for tasks assessed/performed Overall Cognitive Status: Within Functional Limits for tasks assessed                       Extremity/Trunk Assessment               Exercises     Shoulder Instructions       General Comments      Pertinent Vitals/ Pain       Pain Score: 8  Pain Location: L hip Pain Descriptors / Indicators: Guarding Pain Intervention(s): Limited activity within patient's tolerance;Monitored during session;Repositioned;Relaxation;Ice applied;Patient requesting pain meds-RN notified  Home Living                                          Prior Functioning/Environment              Frequency  Min 2X/week        Progress Toward Goals  OT Goals(current goals can now be found in the care plan section)  Progress towards OT goals: Progressing toward goals     Plan Discharge plan needs to be updated    Co-evaluation  End of Session Equipment Utilized During Treatment: Rolling walker   Activity Tolerance Patient tolerated treatment well   Patient Left in bed;with call bell/phone within reach   Nurse Communication          Time: [id number] OT Time Calculation (min): 25 min  Charges: OT General Charges $OT Visit: 1 Procedure OT Treatments $Self Care/Home Management : 23-37 mins  Tomeca Helm, Thereasa Parkin 01/23/2016, 11:22 AM

## 2016-01-23 NOTE — Progress Notes (Signed)
Discharge planning, spoke with patient at beside. Patient states she has friends to call to assist at home. Chose [facility] for [facility] services, contacted [facility] for referral, discussed application for charity benefits. Needs RW and 3-n-1, contacted [facility] to deliver to room. [phone]

## 2016-01-24 MED FILL — tiZANidine HCL 4 MG TABS: 4 | 8 days supply | Qty: 30 | Fill #0

## 2016-01-24 MED FILL — OXYCODONE/APAP 5-325: 5-325 | 5 days supply | Qty: 60 | Fill #0

## 2016-01-26 ENCOUNTER — Telehealth: Payer: Self-pay | Admitting: Internal Medicine

## 2016-01-26 ENCOUNTER — Telehealth: Payer: Self-pay | Admitting: Pharmacist

## 2016-01-26 DIAGNOSIS — F4321 Adjustment disorder with depressed mood: Secondary | ICD-10-CM

## 2016-01-26 DIAGNOSIS — M1612 Unilateral primary osteoarthritis, left hip: Secondary | ICD-10-CM

## 2016-01-26 MED ORDER — SERTRALINE HCL 50 MG PO TABS: 50.0000 mg | ORAL_TABLET | Freq: Every day | ORAL | 2 refills | Status: DC

## 2016-01-26 MED ORDER — DICLOFENAC SODIUM 50 MG PO TBEC: 50.0000 mg | DELAYED_RELEASE_TABLET | Freq: Two times a day (BID) | ORAL | 1 refills | Status: DC | PRN

## 2016-01-26 NOTE — Telephone Encounter (Signed)
Would like to talk to nurse °

## 2016-01-26 NOTE — Progress Notes (Signed)
Patient unable to afford diclofenac or sertraline. Transferred prescriptions to [place]

## 2016-01-26 NOTE — Telephone Encounter (Signed)
Return pt's call - no answer; left message to give us a call back. 

## 2016-01-27 NOTE — Telephone Encounter (Signed)
Pt called again - no answer; left message to give [place] a call back.

## 2016-01-28 ENCOUNTER — Emergency Department (HOSPITAL_COMMUNITY)
Admission: EM | Admit: 2016-01-28 | Discharge: 2016-01-28 | Disposition: A | Discharge status: Home / Self Care | Payer: No Typology Code available for payment source | Attending: Emergency Medicine | Admitting: Emergency Medicine

## 2016-01-28 DIAGNOSIS — Z7982 Long term (current) use of aspirin: Secondary | ICD-10-CM | POA: Insufficient documentation

## 2016-01-28 DIAGNOSIS — K0510 Chronic gingivitis, plaque induced: Secondary | ICD-10-CM

## 2016-01-28 DIAGNOSIS — F17210 Nicotine dependence, cigarettes, uncomplicated: Secondary | ICD-10-CM | POA: Insufficient documentation

## 2016-01-28 DIAGNOSIS — Z96642 Presence of left artificial hip joint: Secondary | ICD-10-CM | POA: Insufficient documentation

## 2016-01-28 DIAGNOSIS — K0500 Acute gingivitis, plaque induced: Secondary | ICD-10-CM | POA: Insufficient documentation

## 2016-01-28 DIAGNOSIS — I10 Essential (primary) hypertension: Secondary | ICD-10-CM | POA: Insufficient documentation

## 2016-01-28 DIAGNOSIS — K047 Periapical abscess without sinus: Secondary | ICD-10-CM | POA: Insufficient documentation

## 2016-01-28 MED ORDER — PENICILLIN V POTASSIUM 500 MG PO TABS: 500.0000 mg | ORAL_TABLET | Freq: Four times a day (QID) | ORAL | 0 refills | Status: AC

## 2016-01-28 NOTE — ED Provider Notes (Signed)
[place] DEPT Provider Note   CSN: [id number] Arrival date & time: 01/28/16  [id number]  By signing my name below, I, Higinio Plan, attest that this documentation has been prepared under the direction and in the presence of non-physician practitioner, Jeannett Senior, PA-C. Electronically Signed: Higinio Plan, Scribe. 01/28/2016. 9:34 AM.  History   Chief Complaint Chief Complaint  Patient presents with  . Oral Swelling   The history is provided by the patient. No language interpreter was used.   HPI Comments: Jean [name] is a 50 y.o. female with PMHx of HTN and arthritis, who presents to the Emergency Department complaining of gradually worsening, right upper dental pain and gum swelling that began yesterday. Pt reports she is unable to "bite her food" due to pain. She states she has gargled with salt water and mouthwash with no relief. She also notes associated mild sore throat but denies fever. Pt reports PSHx of left total hip arthroplasty by Dr. [name] on 01/20/16.   Past Medical History:  Diagnosis Date  . Arthritis   . Bronchitis    states at least 2-3 months ago  . Carpal tunnel syndrome    bilateral  . Depression   . GERD (gastroesophageal reflux disease)   . Headache    migraines  . Heart murmur   . Herpes   . Hypertension     Patient Active Problem List   Diagnosis Date Noted  . Osteoarthritis of left hip 01/20/2016  . Status post left hip replacement 01/20/2016  . Liver lesion 12/01/2015  . Bronchiectasis ([place]) 12/01/2015  . Ovarian cyst 12/01/2015  . DDD (degenerative disc disease), thoracic 11/23/2015  . Abnormal CXR 10/19/2015  . Acute bronchitis 10/19/2015  . Situational depression 10/19/2015  . Dyspnea on exertion 08/22/2015  . HTN (hypertension) 07/08/2015  . Tear of acetabular labrum 07/08/2015  . Routine health maintenance 07/08/2015  . Tobacco abuse 07/08/2015    Past Surgical History:  Procedure Laterality Date  . NO PAST SURGERIES      . TOTAL HIP ARTHROPLASTY Left 01/20/2016   Procedure: LEFT TOTAL HIP ARTHROPLASTY ANTERIOR APPROACH;  Surgeon: Mcarthur Rossetti, MD;  Location: [facility] ORS;  Service: Orthopedics;  Laterality: Left;  . TUBAL LIGATION      OB History    No data available     Home Medications    Prior to Admission medications   Medication Sig Start Date End Date Taking? Authorizing Provider  albuterol (PROVENTIL HFA;VENTOLIN HFA) 108 (90 Base) MCG/ACT inhaler Inhale 1-2 puffs into the lungs every 6 (six) hours as needed for wheezing or shortness of breath.    Historical Provider, MD  aspirin EC 325 MG EC tablet Take 1 tablet (325 mg total) by mouth daily. 01/23/16   Mcarthur Rossetti, MD  Capsaicin-Menthol-Methyl Sal (CAPSAICIN-METHYL SAL-MENTHOL) 0.025-1-12 % CREA Apply to left hip twice daily, be sure to wash hands after application [id number]   Archie Patten, MD  cyclobenzaprine (FLEXERIL) 10 MG tablet Take 0.5 tablets (5 mg total) by mouth 3 (three) times daily as needed for muscle spasms. 12/20/15   Florinda Marker, MD  diclofenac (VOLTAREN) 50 MG EC tablet Take 1 tablet (50 mg total) by mouth 2 (two) times daily as needed. 01/26/16   Tasrif Ahmed, MD  hydrochlorothiazide (MICROZIDE) 12.5 MG capsule Take 1 capsule (12.5 mg total) by mouth daily. 12/09/15 12/08/16  Dellia Nims, MD  HYDROcodone-acetaminophen (NORCO/VICODIN) 5-325 MG tablet Take 1 tablet by mouth every 12 (twelve) hours as needed for moderate  pain. 1 tab every 8-12 hours prn    Historical Provider, MD  oxyCODONE-acetaminophen (ROXICET) 5-325 MG tablet Take 1-2 tablets by mouth every 4 (four) hours as needed. 01/23/16   Mcarthur Rossetti, MD  sertraline (ZOLOFT) 50 MG tablet Take 1 tablet (50 mg total) by mouth daily. 01/26/16 01/25/17  Tasrif Ahmed, MD  tiZANidine (ZANAFLEX) 4 MG tablet Take 1 tablet (4 mg total) by mouth every 6 (six) hours as needed for muscle spasms. 01/23/16   Mcarthur Rossetti, MD    Family History Family History   Problem Relation Age of Onset  . Cancer Mother   . Hypertension Mother   . Diabetes Mother   . Stroke Father     Social History Social History  Substance Use Topics  . Smoking status: Current Some Day Smoker    Packs/day: 0.20    Types: Cigarettes  . Smokeless tobacco: Never Used     Comment:  ABOUT 2 CIGARETTES EVERY 2-3DAYS  . Alcohol use 1.2 oz/week    2 Cans of beer per week     Comment: Beer.     Allergies   Review of patient's allergies indicates no known allergies.   Review of Systems Review of Systems  Constitutional: Negative for fever.  HENT: Positive for dental problem and sore throat.    Physical Exam Updated Vital Signs BP 129/76 (BP Location: Left Arm)   Pulse 90   Temp 98.7 F (37.1 C) (Oral)   Resp 18   LMP 01/06/2016   SpO2 96%   Physical Exam  Constitutional: She is oriented to person, place, and time. She appears well-developed and well-nourished.  HENT:  Head: Normocephalic and atraumatic.  Right upper gum swelling, specifically over the lateral right upper incisor and first and second premolars. Also some swelling noted over her right lower first molar. There is no obvious abscess. No trismus. No swelling under the tongue. Tenderness to palpation over this teeth. Oropharynx is normal.  Eyes: Conjunctivae are normal. Pupils are equal, round, and reactive to light. Right eye exhibits no discharge. Left eye exhibits no discharge. No scleral icterus.  Neck: Normal range of motion. No JVD present. No tracheal deviation present.  Cardiovascular: Normal rate, regular rhythm and normal heart sounds.   Pulmonary/Chest: Effort normal and breath sounds normal. No stridor. No respiratory distress. She has no wheezes. She has no rales.  Neurological: She is alert and oriented to person, place, and time. Coordination normal.  Psychiatric: She has a normal mood and affect. Her behavior is normal. Judgment and thought content normal.  Nursing note and vitals  reviewed.  ED Treatments / Results  Labs (all labs ordered are listed, but only abnormal results are displayed) Labs Reviewed - No data to display  EKG  EKG Interpretation None       Radiology No results found.  Procedures Procedures (including critical care time)  Medications Ordered in ED Medications - No data to display  DIAGNOSTIC STUDIES:  Oxygen Saturation is 96% on RA, normal by my interpretation.    COORDINATION OF CARE:  9:32 AM Discussed treatment plan with pt at bedside and pt agreed to plan.  Initial Impression / Assessment and Plan / ED Course  I have reviewed the triage vital signs and the nursing notes.  Pertinent labs & imaging results that were available during my care of the patient were reviewed by me and considered in my medical decision making (see chart for details).  Clinical Course   Patient  with some gum erythema and swelling, no obvious dental abscess. She does have several cavities. Question gingivitis versus early dental abscesses. We'll start on penicillin. Will have patient follow up with primary care doctor.  Vitals:   01/28/16 0922  BP: 129/76  Pulse: 90  Resp: 18  Temp: 98.7 F (37.1 C)  TempSrc: Oral  SpO2: 96%     I personally performed the services described in this documentation, which was scribed in my presence. The recorded information has been reviewed and is accurate.   Final Clinical Impressions(s) / ED Diagnoses   Final diagnoses:  Gingivitis  Dental infection    New Prescriptions New Prescriptions   No medications on file     Jeannett Senior, PA-C 01/28/16 [place], MD 01/28/16 1800

## 2016-01-28 NOTE — Discharge Instructions (Signed)
Take penicillin as prescribed until all gone. Continue regular pain medications. Salt water mouth rinses. Please follow up with a dentist.

## 2016-01-28 NOTE — ED Triage Notes (Signed)
Patient complains of dental pain x 1 day. States that she has gum swelling and pain and thinks she has abscess

## 2016-01-31 ENCOUNTER — Ambulatory Visit (INDEPENDENT_AMBULATORY_CARE_PROVIDER_SITE_OTHER): Payer: No Typology Code available for payment source | Admitting: Internal Medicine

## 2016-01-31 DIAGNOSIS — K0889 Other specified disorders of teeth and supporting structures: Secondary | ICD-10-CM

## 2016-01-31 NOTE — Progress Notes (Signed)
   CC: here for ED follow up of dental pain  HPI:  Jean [name] is a 50 y.o. woman with recent left total hip arthroplasty on 9/15 who presented to the ED on 9/23 with tooth pain and gum swelling.  She was given penicillin 500mg  QID but only started this medication yesterday.  She reports symptoms began shortly after her surgery and include tooth pain located right upper part of mouth and gum swelling.  She also reports "blisters" in her mouth that she has never experienced before.  She attempted salt water gargles and mouth wash before going to the ED.  She reports a sore throat in the days following surgery that has resolved.  She denies any trouble swallowing or clearing her secretions.  She states it feels as though her whole mouth is hurting and it is painful to bite or chew food.  She also reports fevers with temperature of 101.2 this morning.  She is afebrile currently.  She reports a history of tooth abscess and having a tooth pulled about a year ago.  That was the last time she saw a dentist.  Past Medical History:  Diagnosis Date  . Arthritis   . Bronchitis    states at least 2-3 months ago  . Carpal tunnel syndrome    bilateral  . Depression   . GERD (gastroesophageal reflux disease)   . Headache    migraines  . Heart murmur   . Herpes   . Hypertension     Review of Systems:   Please see pertinent ROS reviewed in HPI and problem based charting.   Physical Exam:  Vitals:   01/31/16 1534  BP: (!) 124/91  Pulse: 89  Temp: 99.3 F (37.4 C)  TempSrc: Oral  SpO2: 100%  Weight: 134 lb 9.6 oz (61.1 kg)  Height: 5\' 3"  (1.6 m)   Physical Exam  Constitutional: She is oriented to person, place, and time.  Sitting in wheelchair, uncomfortable looking due to tooth pain but also hip pain as she frequently is pivoting in her seat.  HENT:  Right upper gum swelling with no obvious abscess. Multiple dental caries.   No bleeding or blisters, but does appear to have a couple  of canker sores on lower gumline.   Mucus membranes are dry with halitosis. Tender over her mandibular area. Oropharynx otherwise clear with no exudates.   Neck:  Solitary 1cm lymph node right submandibular.  Pulmonary/Chest: Effort normal.  Lymphadenopathy:    She has cervical adenopathy.  Neurological: She is alert and oriented to person, place, and time.     Assessment & Plan:   See Encounters Tab for problem based charting.  Patient discussed with Dr. Daryll Drown.  Pain, dental A: Patient seen in the ED on 9/23, started on penicillin.  She just started the antibiotics yesterday so it is likely too early to determine clinical effectiveness at this point.  She has no obvious abscess on exam but does have dental caries and what appears to be evidence of periodontal disease.  P: - we will continue Penicillin as prescribed by the ED for now.  If not improving by the next 2-3 days, we have asked patient to return to consider other antibiotic option such as Augmentin or Clindamycin - pain control via medication prescribed by her surgeon - continue oral hygiene - RTC in 3 days if needed or not improving - dental clinic referral placed today.

## 2016-01-31 NOTE — Patient Instructions (Signed)
Thank you for coming to see me today. It was a pleasure. Today we talked about:   Mouth and Tooth Pain: - please continue your antibiotics given to you in the emergency department - continue to keep your mouth clean and rinse with mouthwash - if you are not getting better by Friday, please call and let us know to be seen because we may need to change your antibiotic at that time - we will place an urgent dental referral.  Please follow-up with [place] by Friday if needed.  If you have any questions or concerns, please do not hesitate to call the office at [phone].  Take Care,   Jule Ser, DO

## 2016-01-31 NOTE — Assessment & Plan Note (Signed)
A: Patient seen in the ED on 9/23, started on penicillin.  She just started the antibiotics yesterday so it is likely too early to determine clinical effectiveness at this point.  She has no obvious abscess on exam but does have dental caries and what appears to be evidence of periodontal disease.  P: - we will continue Penicillin as prescribed by the ED for now.  If not improving by the next 2-3 days, we have asked patient to return to consider other antibiotic option such as Augmentin or Clindamycin - pain control via medication prescribed by her surgeon - continue oral hygiene - RTC in 3 days if needed or not improving - dental clinic referral placed today.

## 2016-02-02 MED FILL — OXYCODONE/APAP 5-325: 5-325 | 5 days supply | Qty: 60 | Fill #0

## 2016-02-03 ENCOUNTER — Ambulatory Visit (HOSPITAL_COMMUNITY)
Admission: RE | Admit: 2016-02-03 | Discharge: 2016-02-03 | Disposition: A | Discharge status: Home / Self Care | Payer: Self-pay | Source: Ambulatory Visit | Attending: Internal Medicine | Admitting: Internal Medicine

## 2016-02-03 DIAGNOSIS — D259 Leiomyoma of uterus, unspecified: Secondary | ICD-10-CM | POA: Insufficient documentation

## 2016-02-03 DIAGNOSIS — N83201 Unspecified ovarian cyst, right side: Secondary | ICD-10-CM | POA: Insufficient documentation

## 2016-02-06 ENCOUNTER — Telehealth: Payer: Self-pay | Admitting: *Deleted

## 2016-02-06 NOTE — Telephone Encounter (Signed)
Pt would like U/S results, may I share this with her?

## 2016-02-06 NOTE — Telephone Encounter (Signed)
yes

## 2016-02-13 MED FILL — OXYCODONE/APAP 5-325: 5-325 | 10 days supply | Qty: 60 | Fill #0

## 2016-02-13 NOTE — Progress Notes (Signed)
Internal Medicine Clinic Attending  Case discussed with Dr. Wallace at the time of the visit.  We reviewed the resident's history and exam and pertinent patient test results.  I agree with the assessment, diagnosis, and plan of care documented in the resident's note.  

## 2016-02-15 ENCOUNTER — Telehealth: Payer: Self-pay | Admitting: Internal Medicine

## 2016-02-15 NOTE — Telephone Encounter (Signed)
APT, REMINDER CALL, LMTCB °

## 2016-02-16 ENCOUNTER — Ambulatory Visit (INDEPENDENT_AMBULATORY_CARE_PROVIDER_SITE_OTHER): Payer: No Typology Code available for payment source | Admitting: Internal Medicine

## 2016-02-16 VITALS — BP 121/59 | HR 69 | Temp 98.2°F | Ht 63.0 in | Wt 136.4 lb

## 2016-02-16 DIAGNOSIS — M25552 Pain in left hip: Secondary | ICD-10-CM

## 2016-02-16 DIAGNOSIS — Z96642 Presence of left artificial hip joint: Secondary | ICD-10-CM

## 2016-02-16 DIAGNOSIS — D259 Leiomyoma of uterus, unspecified: Secondary | ICD-10-CM

## 2016-02-16 DIAGNOSIS — M549 Dorsalgia, unspecified: Secondary | ICD-10-CM

## 2016-02-16 DIAGNOSIS — G8929 Other chronic pain: Secondary | ICD-10-CM

## 2016-02-16 HISTORY — DX: Leiomyoma of uterus, unspecified: D25.9

## 2016-02-16 NOTE — Progress Notes (Signed)
   CC: Fibroids  HPI:  [name] is a 50 y.o. female with PMH of HTN, OA s/p left hip replacement, and DDD who presents for follow up management of uterine fibroids.  Fibroids: Patient referred for pelvic U/S after an incidental right ovarian cyst was seen on MRI of left hip on 11/27/15. Transvaginal/Pelvic U/S on 02/03/16 showed that the right ovarian cyst has resolved. It also showed several right sided uterine fibroids, at least 2 of which were pedunculated. Patient reports having regular monthly menstrual periods with mildly heavy bleeding and cramping which last 3 days. She reports occasional lightheadedness. She denies any syncopal episodes. Her last Hgb was 10.5 (day after left hip replacement), prior to this was normal going back to 2014.  Left hip and back pain: She underwent left hip replacement on 01/20/16. She has been using a cane to ambulate since then and participated in PT at home. She denies any falls but has continued pain and reports some numbness of her anterior left thigh. She has continued chronic back pain. Both her back and left hip pain are controlled with oxycodone-APAP 5-325 mg, flexeril, capsaicin cream, Diclofenac, heat/ice packs, and icyhot patches. She has a follow up appointment with her orthopedic doctor on 03/01/16.       Past Medical History:  Diagnosis Date  . Arthritis   . Bronchitis    states at least 2-3 months ago  . Carpal tunnel syndrome    bilateral  . Depression   . GERD (gastroesophageal reflux disease)   . Headache    migraines  . Heart murmur   . Herpes   . Hypertension     Review of Systems:   Review of Systems  Respiratory: Negative for sputum production.   Cardiovascular: Negative for chest pain.  Genitourinary:       Regular monthly periods  Musculoskeletal: Positive for back pain and joint pain. Negative for falls.       Left hip and back pain.  Neurological: Negative for loss of consciousness.       Occasional  lightheadedness. Numbness anterior left thigh.     Physical Exam:  Vitals:   02/16/16 0909  BP: (!) 121/59  Pulse: 69  Temp: 98.2 F (36.8 C)  TempSrc: Oral  SpO2: 100%  Weight: 136 lb 6.4 oz (61.9 kg)  Height: 5\' 3"  (1.6 m)   Physical Exam  Constitutional: She is oriented to person, place, and time. She appears well-developed and well-nourished. No distress.  Cardiovascular: Normal rate and regular rhythm.   Systolic murmur RUS border  Pulmonary/Chest: Effort normal. No respiratory distress. She has no wheezes. She has no rales.  Abdominal: Soft. She exhibits no distension.  Musculoskeletal:  Decreased ROM left hip secondary to pain. Ambulating with a cane. Paraspinal tenderness.  Neurological: She is alert and oriented to person, place, and time.  Skin: Skin is warm.    Assessment & Plan:   See Encounters Tab for problem based charting.  Patient discussed with Dr. Dareen Piano

## 2016-02-16 NOTE — Assessment & Plan Note (Signed)
She underwent left hip replacement on 01/20/16. She has been using a cane to ambulate since then and participated in PT at home. She denies any falls but has continued pain and reports some numbness of her anterior left thigh. She has continued chronic back pain. Both her back and left hip pain are controlled with oxycodone-APAP 5-325 mg, flexeril, capsaicin cream, Diclofenac, heat/ice packs, and icyhot patches. She has a follow up appointment with her orthopedic doctor on 03/01/16. -Continue current medications -f/u with orthopedics

## 2016-02-16 NOTE — Patient Instructions (Signed)
It was a pleasure to meet you [name].  Your ultrasound showed that you do have some fibroids. If you are not having significant bleeding, cramping pain, or lightheadedness or dizziness this is something we can continue to monitor. If any of the above changes are significantly bothering you, we may need to consider surgical evaluation.  Please follow up with your Orthopedic Doctor as scheduled.   Please call us if there is anything else we can help you with or if your symptoms worsen.    Uterine Fibroids Uterine fibroids are tissue masses (tumors) that can develop in the womb (uterus). They are also called leiomyomas. This type of tumor is not cancerous (benign) and does not spread to other parts of the body outside of the pelvic area, which is between the hip bones. Occasionally, fibroids may develop in the fallopian tubes, in the cervix, or on the support structures (ligaments) that surround the uterus. You can have one or many fibroids. Fibroids can vary in size, weight, and where they grow in the uterus. Some can become quite large. Most fibroids do not require medical treatment. CAUSES A fibroid can develop when a single uterine cell keeps growing (replicating). Most cells in the human body have a control mechanism that keeps them from replicating without control. SIGNS AND SYMPTOMS Symptoms may include:   Heavy bleeding during your period.  Bleeding or spotting between periods.  Pelvic pain and pressure.  Bladder problems, such as needing to urinate more often (urinary frequency) or urgently.  Inability to reproduce offspring (infertility).  Miscarriages. DIAGNOSIS Uterine fibroids are diagnosed through a physical exam. Your health care provider may feel the lumpy tumors during a pelvic exam. Ultrasonography and an MRI may be done to determine the size, location, and number of fibroids. TREATMENT Treatment may include:  Watchful waiting. This involves getting the fibroid  checked by your health care provider to see if it grows or shrinks. Follow your health care provider's recommendations for how often to have this checked.  Hormone medicines. These can be taken by mouth or given through an intrauterine device (IUD).  Surgery.  Removing the fibroids (myomectomy) or the uterus (hysterectomy).  Removing blood supply to the fibroids (uterine artery embolization). If fibroids interfere with your fertility and you want to become pregnant, your health care provider may recommend having the fibroids removed.  HOME CARE INSTRUCTIONS  Keep all follow-up visits as directed by your health care provider. This is important.  Take medicines only as directed by your health care provider.  If you were prescribed a hormone treatment, take the hormone medicines exactly as directed.  Do not take aspirin, because it can cause bleeding.  Ask your health care provider about taking iron pills and increasing the amount of dark green, leafy vegetables in your diet. These actions can help to boost your blood iron levels, which may be affected by heavy menstrual bleeding.  Pay close attention to your period and tell your health care provider about any changes, such as:  Increased blood flow that requires you to use more pads or tampons than usual per month.  A change in the number of days that your period lasts per month.  A change in symptoms that are associated with your period, such as abdominal cramping or back pain. SEEK MEDICAL CARE IF:  You have pelvic pain, back pain, or abdominal cramps that cannot be controlled with medicines.  You have an increase in bleeding between and during periods.  You soak  tampons or pads in a half hour or less.  You feel lightheaded, extra tired, or weak. SEEK IMMEDIATE MEDICAL CARE IF:  You faint.  You have a sudden increase in pelvic pain.   This information is not intended to replace advice given to you by your health care  provider. Make sure you discuss any questions you have with your health care provider.   Document Released: 04/20/2000 Document Revised: 05/14/2014 Document Reviewed: 10/20/2013 Elsevier Interactive Patient Education [employer].

## 2016-02-16 NOTE — Assessment & Plan Note (Signed)
Patient referred for pelvic U/S after an incidental right ovarian cyst was seen on MRI of left hip on 11/27/15. Transvaginal/Pelvic U/S on 02/03/16 showed that the right ovarian cyst has resolved. It also showed several right sided uterine fibroids, at least 2 of which were pedunculated. Patient reports having regular monthly menstrual periods with mildly heavy bleeding and cramping which last 3 days. She reports occasional lightheadedness. She denies any syncopal episodes. Her last Hgb was 10.5 (day after left hip replacement), prior to this was normal going back to 2014.  Patient states that she wants to continue with monitoring of her fibroids and symptoms as these are not causing her significant issues. She is advised that should her symptoms worsen or she develop excess bleeding, dizziness, or syncopal episodes that we may need to consider surgical options such as myomectomy or hysterectomy. She understands. -Continue to monitor fibroids -Consider referral for surgical options if symptoms worsen -Provided patient information on fibroids

## 2016-02-18 ENCOUNTER — Encounter (HOSPITAL_COMMUNITY): Payer: Self-pay | Admitting: Emergency Medicine

## 2016-02-18 ENCOUNTER — Emergency Department (HOSPITAL_COMMUNITY): Payer: Self-pay

## 2016-02-18 ENCOUNTER — Emergency Department (HOSPITAL_COMMUNITY)
Admission: EM | Admit: 2016-02-18 | Discharge: 2016-02-18 | Disposition: A | Discharge status: Home / Self Care | Payer: Self-pay | Attending: Emergency Medicine | Admitting: Emergency Medicine

## 2016-02-18 DIAGNOSIS — M25562 Pain in left knee: Secondary | ICD-10-CM | POA: Insufficient documentation

## 2016-02-18 DIAGNOSIS — I10 Essential (primary) hypertension: Secondary | ICD-10-CM | POA: Insufficient documentation

## 2016-02-18 DIAGNOSIS — Z79899 Other long term (current) drug therapy: Secondary | ICD-10-CM | POA: Insufficient documentation

## 2016-02-18 DIAGNOSIS — Z87891 Personal history of nicotine dependence: Secondary | ICD-10-CM | POA: Insufficient documentation

## 2016-02-18 DIAGNOSIS — Z96642 Presence of left artificial hip joint: Secondary | ICD-10-CM | POA: Insufficient documentation

## 2016-02-18 DIAGNOSIS — M25552 Pain in left hip: Secondary | ICD-10-CM | POA: Insufficient documentation

## 2016-02-18 DIAGNOSIS — Z7982 Long term (current) use of aspirin: Secondary | ICD-10-CM | POA: Insufficient documentation

## 2016-02-18 LAB — POC URINE PREG, ED: Preg Test, Ur: NEGATIVE

## 2016-02-18 MED ORDER — OXYCODONE-ACETAMINOPHEN 5-325 MG PO TABS
1.0000 | ORAL_TABLET | Freq: Once | ORAL | Status: AC
  Administered 2016-02-18: 1 via ORAL
  Filled 2016-02-18: qty 1

## 2016-02-18 MED ORDER — IBUPROFEN 600 MG PO TABS: 600.0000 mg | ORAL_TABLET | Freq: Four times a day (QID) | ORAL | 0 refills | Status: DC | PRN

## 2016-02-18 NOTE — ED Triage Notes (Signed)
Pt reports Post -op pain meds are not relieving pain to Lt hip.  Pt has been taking percocet given by surgeon . Pt is requesting more pain meds.

## 2016-02-18 NOTE — ED Notes (Signed)
Declined W/C at D/C and was escorted to lobby by RN. 

## 2016-02-18 NOTE — ED Provider Notes (Signed)
[facility] DEPT Provider Note   CSN: [id number] Arrival date & time: 02/18/16  1127   By signing my name below, I, Delton Prairie, attest that this documentation has been prepared under the direction and in the presence of  Harlene Ramus, PA-C. Electronically Signed: Delton Prairie, ED Scribe. 02/18/16. 1:14 PM.   History   Chief Complaint Chief Complaint  Patient presents with  . Hip Pain  . Knee Pain   The history is provided by the patient. No language interpreter was used.   HPI Comments:  Jean [name] is a 50 y.o. female, with a PSHx of hip replacement on 01/20/16,who presents to the Emergency Department complaining of constant "sharp" L hip and L knee pain x 1 week. She notes the pain has worsened x 2 daysBut appears to be consistent with chronic pain she has had since surgery. Pt states pain is exacerbated with movment, pressure, and sitting. She notes swelling of her surgical site. Pt denies any recent falls, injury, fevers, numbness and tingling to her BLE, abdominal pain and drainage from surgical site. She has taken muscle relaxer and Percocet with little relief. Pt denies any other complaints at this time.   Orthopedic- Dr. [name]  Past Medical History:  Diagnosis Date  . Arthritis   . Bronchitis    states at least 2-3 months ago  . Carpal tunnel syndrome    bilateral  . Depression   . GERD (gastroesophageal reflux disease)   . Headache    migraines  . Heart murmur   . Herpes   . Hypertension     Patient Active Problem List   Diagnosis Date Noted  . Uterine leiomyoma 02/16/2016  . Pain, dental 01/31/2016  . Osteoarthritis of left hip 01/20/2016  . Status post left hip replacement 01/20/2016  . Liver lesion 12/01/2015  . Bronchiectasis ([place]) 12/01/2015  . DDD (degenerative disc disease), thoracic 11/23/2015  . Abnormal CXR 10/19/2015  . Acute bronchitis 10/19/2015  . Situational depression 10/19/2015  . Dyspnea on exertion 08/22/2015  . HTN  (hypertension) 07/08/2015  . Tear of acetabular labrum 07/08/2015  . Routine health maintenance 07/08/2015  . Tobacco abuse 07/08/2015    Past Surgical History:  Procedure Laterality Date  . NO PAST SURGERIES    . SALPINGECTOMY    . TOTAL HIP ARTHROPLASTY Left 01/20/2016   Procedure: LEFT TOTAL HIP ARTHROPLASTY ANTERIOR APPROACH;  Surgeon: Mcarthur Rossetti, MD;  Location: [facility] ORS;  Service: Orthopedics;  Laterality: Left;    OB History    No data available       Home Medications    Prior to Admission medications   Medication Sig Start Date End Date Taking? Authorizing Provider  albuterol (PROVENTIL HFA;VENTOLIN HFA) 108 (90 Base) MCG/ACT inhaler Inhale 1-2 puffs into the lungs every 6 (six) hours as needed for wheezing or shortness of breath.    Historical Provider, MD  aspirin EC 325 MG EC tablet Take 1 tablet (325 mg total) by mouth daily. 01/23/16   Mcarthur Rossetti, MD  Capsaicin-Menthol-Methyl Sal (CAPSAICIN-METHYL SAL-MENTHOL) 0.025-1-12 % CREA Apply to left hip twice daily, be sure to wash hands after application [id number]   Archie Patten, MD  cyclobenzaprine (FLEXERIL) 10 MG tablet Take 0.5 tablets (5 mg total) by mouth 3 (three) times daily as needed for muscle spasms. 12/20/15   Florinda Marker, MD  diclofenac (VOLTAREN) 50 MG EC tablet Take 1 tablet (50 mg total) by mouth 2 (two) times daily as needed. 01/26/16  Dellia Nims, MD  hydrochlorothiazide (MICROZIDE) 12.5 MG capsule Take 1 capsule (12.5 mg total) by mouth daily. 12/09/15 12/08/16  Dellia Nims, MD  HYDROcodone-acetaminophen (NORCO/VICODIN) 5-325 MG tablet Take 1 tablet by mouth every 12 (twelve) hours as needed for moderate pain. 1 tab every 8-12 hours prn    Historical Provider, MD  ibuprofen (ADVIL,MOTRIN) 600 MG tablet Take 1 tablet (600 mg total) by mouth every 6 (six) hours as needed. 02/18/16   Nona Dell, PA-C  oxyCODONE-acetaminophen (ROXICET) 5-325 MG tablet Take 1-2 tablets by mouth every  4 (four) hours as needed. 01/23/16   Mcarthur Rossetti, MD  sertraline (ZOLOFT) 50 MG tablet Take 1 tablet (50 mg total) by mouth daily. 01/26/16 01/25/17  Tasrif Ahmed, MD  tiZANidine (ZANAFLEX) 4 MG tablet Take 1 tablet (4 mg total) by mouth every 6 (six) hours as needed for muscle spasms. 01/23/16   Mcarthur Rossetti, MD    Family History Family History  Problem Relation Age of Onset  . Cancer Mother   . Hypertension Mother   . Diabetes Mother   . Stroke Father     Social History Social History  Substance Use Topics  . Smoking status: Former Smoker    Packs/day: 0.20    Types: Cigarettes    Quit date: 01/09/2016  . Smokeless tobacco: Never Used     Comment:  ABOUT 2 CIGARETTES EVERY 2-3DAYS  . Alcohol use No     Comment: stopped September 2017     Allergies   Review of patient's allergies indicates no known allergies.   Review of Systems Review of Systems  Constitutional: Negative for fever.  Gastrointestinal: Negative for abdominal pain.  Musculoskeletal: Positive for arthralgias.  Neurological: Negative for numbness.     Physical Exam Updated Vital Signs BP 118/71 (BP Location: Right Arm)   Pulse 83   Temp 98.3 F (36.8 C) (Oral)   Resp 18   Ht 5\' 3"  (1.6 m)   Wt 61.7 kg   LMP 01/22/2016   SpO2 100%   BMI 24.09 kg/m   Physical Exam  Constitutional: She is oriented to person, place, and time. She appears well-developed and well-nourished. No distress.  HENT:  Head: Normocephalic and atraumatic.  Eyes: Conjunctivae and EOM are normal. Right eye exhibits no discharge. Left eye exhibits no discharge. No scleral icterus.  Neck: Normal range of motion. Neck supple.  Cardiovascular: Normal rate and intact distal pulses.   Pulmonary/Chest: Effort normal.  Abdominal: She exhibits no distension.  Musculoskeletal: She exhibits no edema or deformity.  Diffuse tenderness with light palpation of L anterior lateral and posterior hip, L anterior thigh and L  knee. Decresed active ROM of L knee and hip due to reported pain. Full passive ROM of L hip and knee. No swelling, erythema, or warmth. No midline spinal tenderness.   Neurological: She is alert and oriented to person, place, and time.  Skin: Skin is warm and dry. Capillary refill takes less than 2 seconds.  Well healing surgical scar noted to left proximal anterior thigh with no induration, fluctuance, or drainage. Sensations grossly intact. 2+ DP pulse.   Psychiatric: She has a normal mood and affect.  Nursing note and vitals reviewed.    ED Treatments / Results  DIAGNOSTIC STUDIES:  Oxygen Saturation is 100% on RA, normal by my interpretation.    COORDINATION OF CARE:  12:57 PM Discussed treatment plan with pt at bedside and pt agreed to plan.  Labs (all labs ordered  are listed, but only abnormal results are displayed) Labs Reviewed  POC URINE PREG, ED    EKG  EKG Interpretation None       Radiology Dg Knee Complete 4 Views Left  Result Date: 02/18/2016 CLINICAL DATA:  Left hip knee pain, no injury. EXAM: LEFT KNEE - COMPLETE 4+ VIEW COMPARISON:  None. FINDINGS: No joint effusion or fracture.  No degenerative changes. IMPRESSION: Negative. Electronically Signed   By: Lorin Picket M.D.   On: 02/18/2016 14:23   Dg Hip Unilat W Or Wo Pelvis 2-3 Views Left  Result Date: 02/18/2016 CLINICAL DATA:  Left hip pain that travels down to the left knee for 1 week. No injury. EXAM: DG HIP (WITH OR WITHOUT PELVIS) 2-3V LEFT COMPARISON:  01/20/2016. FINDINGS: Left total hip arthroplasty. No fracture. Right hip is unremarkable. Degenerative changes are seen in the lumbosacral spine. IMPRESSION: 1. No acute findings to explain the patient's pain. 2. Left total hip arthroplasty without complicating feature. Electronically Signed   By: Lorin Picket M.D.   On: 02/18/2016 14:23    Procedures Procedures (including critical care time)  Medications Ordered in ED Medications    oxyCODONE-acetaminophen (PERCOCET/ROXICET) 5-325 MG per tablet 1 tablet (1 tablet Oral Given 02/18/16 1522)     Initial Impression / Assessment and Plan / ED Course  I have reviewed the triage vital signs and the nursing notes.  Pertinent labs & imaging results that were available during my care of the patient were reviewed by me and considered in my medical decision making (see chart for details).  Clinical Course    Patient presents with worsening left hip pain radiating to left knee. Reports having left hip replacement on 9/15 and notes she has had worsening pain since. Endorses walking with cane/walker at baseline status post surgery. Denies any recent fall or injury. VSS. Exam revealed diffuse tenderness to left hip and knee with decreased range of motion due to reported pain. Left leg neurovascularly intact. No midline spinal tenderness. No neuro deficits. Patient X-Ray negative for obvious fracture or dislocation, left total hip arthroplasty without complicating features.  Patient's pain appears to be chronic in nature. Discussed results and plan for discharge with patient. Advised patient to continue taking her home pain meds as prescribed. Pt advised to follow up with orthopedics this week for follow-up. Discussed strict return precautions with patient.   Final Clinical Impressions(s) / ED Diagnoses   Final diagnoses:  Left hip pain    New Prescriptions Discharge Medication List as of 02/18/2016  3:14 PM    START taking these medications   Details  ibuprofen (ADVIL,MOTRIN) 600 MG tablet Take 1 tablet (600 mg total) by mouth every 6 (six) hours as needed., Starting Sat 02/18/2016, Print      I personally performed the services described in this documentation, which was scribed in my presence. The recorded information has been reviewed and is accurate.     Chesley Noon [place], [place] 02/18/16 1656    Ezequiel Essex, MD 02/18/16 [phone]

## 2016-02-18 NOTE — Discharge Instructions (Signed)
Your medication as prescribed as for pain relief. I also recommend continuing to take your prescription of oxycodone as prescribed for pain relief. You may apply ice to affected area for 15 minutes 3-4 times daily for additional pain relief. I recommend calling your orthopedics office on Monday morning to schedule a follow-up appointment for the next week for further management and evaluation of your left hip pain. Please return to the Emergency Department if symptoms worsen or new onset of fever, chills, chest pain, difficulty breathing, back pain, redness, swelling, warmth, drainage, numbness, tingling, weakness.

## 2016-02-18 NOTE — ED Triage Notes (Signed)
Pt c/o had hip replacement done September 15th. Pt c/o pain to left hip and knee x 1 week. Pt denies any recent injury.

## 2016-02-19 NOTE — Progress Notes (Signed)
Internal Medicine Clinic Attending  Case discussed with Dr. Patel,Vishal at the time of the visit.  We reviewed the resident's history and exam and pertinent patient test results.  I agree with the assessment, diagnosis, and plan of care documented in the resident's note.  

## 2016-03-01 ENCOUNTER — Encounter (INDEPENDENT_AMBULATORY_CARE_PROVIDER_SITE_OTHER): Payer: Self-pay | Admitting: Physician Assistant

## 2016-03-01 ENCOUNTER — Ambulatory Visit (INDEPENDENT_AMBULATORY_CARE_PROVIDER_SITE_OTHER): Payer: Self-pay | Admitting: Physician Assistant

## 2016-03-01 DIAGNOSIS — Z96642 Presence of left artificial hip joint: Secondary | ICD-10-CM

## 2016-03-01 MED ORDER — OXYCODONE-ACETAMINOPHEN 5-325 MG PO TABS: 1.0000 | ORAL_TABLET | ORAL | 0 refills | Status: DC | PRN

## 2016-03-01 MED FILL — OXYCODONE W/APAP 5/325 TAB: 5-325 | 5 days supply | Qty: 60 | Fill #0

## 2016-03-01 NOTE — Progress Notes (Signed)
Post-Op Visit Note   Patient: Jean [name]           Date of Birth: [birth year]           MRN: [id number] Visit Date: 03/01/2016 PCP: Dellia Nims, MD  Patient is s/p L THA on 01/20/16, approximately 6 weeks out, here in routine followup.  Not feeling too well today, but most days are good.  She has a lot of pain from pevis down to the knee, went to ED on 02/18/16 for eval.  They told her nothing was wrong with the hip, it was good.  She is taking the percocet as needed.  She is also taking diclofenac and flexeril. She is walking with a cane.  She says she got up out of bed wrong today and twisted something in the hip, near incision. She saw her dentist on 02/09/16 and the mouth was swollen so badly that they just put her on amoxicillin, and returns to see the dentis on 03/15/16.  Right Hip Exam   Tenderness  The patient is experiencing tenderness in the greater trochanter, lateral, Entire IT band from hip to knee.  Comments:  Left hip incision benign no seroma present. Calf supple nontender door/plantarflexion the ankle intact. Left knee with a good range of motion without pain. Log rolling of left hip causes no significant pain.  Extreme internal/external rotation causes pain lateral aspect of the left femur.     Assessment & Plan:  Chief Complaint:  Chief Complaint  Patient presents with  . Left Hip - Routine Post Op   Visit Diagnoses:  1. Presence of left artificial hip joint     Plan: Continue home exercise program  Follow-Up Instructions: Return in about 4 weeks (around 03/29/2016) for post op.   Orders:  No orders of the defined types were placed in this encounter.  Meds ordered this encounter  Medications  . oxyCODONE-acetaminophen (ROXICET) 5-325 MG tablet    Sig: Take 1-2 tablets by mouth every 4 (four) hours as needed.    Dispense:  60 tablet    Refill:  0     PMFS History: Patient Active Problem List   Diagnosis Date Noted  . Uterine leiomyoma  02/16/2016  . Pain, dental 01/31/2016  . Osteoarthritis of left hip 01/20/2016  . Status post left hip replacement 01/20/2016  . Liver lesion 12/01/2015  . Bronchiectasis ([place]) 12/01/2015  . DDD (degenerative disc disease), thoracic 11/23/2015  . Abnormal CXR 10/19/2015  . Acute bronchitis 10/19/2015  . Situational depression 10/19/2015  . Dyspnea on exertion 08/22/2015  . HTN (hypertension) 07/08/2015  . Tear of acetabular labrum 07/08/2015  . Routine health maintenance 07/08/2015  . Tobacco abuse 07/08/2015   Past Medical History:  Diagnosis Date  . Arthritis   . Bronchitis    states at least 2-3 months ago  . Carpal tunnel syndrome    bilateral  . Depression   . GERD (gastroesophageal reflux disease)   . Headache    migraines  . Heart murmur   . Herpes   . Hypertension     Family History  Problem Relation Age of Onset  . Cancer Mother   . Hypertension Mother   . Diabetes Mother   . Stroke Father     Past Surgical History:  Procedure Laterality Date  . NO PAST SURGERIES    . SALPINGECTOMY    . TOTAL HIP ARTHROPLASTY Left 01/20/2016   Procedure: LEFT TOTAL HIP ARTHROPLASTY ANTERIOR APPROACH;  Surgeon: Harrell Gave  Kerry Fort, MD;  Location: [facility];  Service: Orthopedics;  Laterality: Left;   Social History   Occupational History  . Not on file.   Social History Main Topics  . Smoking status: Former Smoker    Packs/day: 0.20    Types: Cigarettes    Quit date: 01/09/2016  . Smokeless tobacco: Never Used     Comment:  ABOUT 2 CIGARETTES EVERY 2-3DAYS  . Alcohol use No     Comment: stopped September 2017  . Drug use:     Types: "Crack" cocaine     Comment: states last used Sept 1 or 2  x 1  . Sexual activity: Yes    Partners: Male

## 2016-03-02 NOTE — Progress Notes (Signed)
I have tried calling the patient again today. A family member picked up the phone. I have relayed the message to ask the patient to call the clinic back.  When patient calls back, please let her know that the imaging shows that the cyst has resolved.  She has some fibroids in her uterus which appear to be benign.

## 2016-03-05 ENCOUNTER — Telehealth (INDEPENDENT_AMBULATORY_CARE_PROVIDER_SITE_OTHER): Payer: Self-pay | Admitting: *Deleted

## 2016-03-05 NOTE — Telephone Encounter (Signed)
Please advise 
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
## 2016-03-06 MED ORDER — OXYCODONE-ACETAMINOPHEN 5-325 MG PO TABS: 1.0000 | ORAL_TABLET | Freq: Four times a day (QID) | ORAL | 0 refills | Status: DC | PRN

## 2016-03-06 MED FILL — OXYCODONE W/APAP 5/325 TAB: 5-325 | 8 days supply | Qty: 60 | Fill #0

## 2016-03-06 NOTE — Telephone Encounter (Signed)
Can come and pick up a script 

## 2016-03-06 NOTE — Telephone Encounter (Signed)
Patient aware Rx ready at front desk  

## 2016-03-28 ENCOUNTER — Ambulatory Visit (INDEPENDENT_AMBULATORY_CARE_PROVIDER_SITE_OTHER): Payer: Self-pay | Admitting: Orthopaedic Surgery

## 2016-03-28 DIAGNOSIS — Z96642 Presence of left artificial hip joint: Secondary | ICD-10-CM

## 2016-03-28 MED ORDER — HYDROCODONE-ACETAMINOPHEN 5-325 MG PO TABS: 1.0000 | ORAL_TABLET | Freq: Three times a day (TID) | ORAL | 0 refills | Status: DC | PRN

## 2016-03-28 MED ORDER — METHYLPREDNISOLONE 4 MG PO TABS: ORAL_TABLET | ORAL | 0 refills | Status: DC

## 2016-03-28 MED ORDER — GABAPENTIN 100 MG PO CAPS: 100.0000 mg | ORAL_CAPSULE | Freq: Three times a day (TID) | ORAL | 0 refills | Status: DC

## 2016-03-28 MED FILL — HYDROCODON-APAP 5-325: 5-325 | 10 days supply | Qty: 60 | Fill #0

## 2016-03-28 NOTE — Progress Notes (Signed)
Jean [name] is still about 2 months out from her hip replacement on the left hip. She still frustrated about the pain that she is having she's been on some largest medications. She had a similar problem with dental abscesses postoperative but she still shows no evidence of infection.  On examination of her left hip I can isolate her left hip and her left knee and put into range of motion and doesn't seem to bother her there is no excessive swelling of her left hip her incisions well-healed there is no redness or warmth either.  At this point we'll try Neurontin 100 mg 3 times a day as well as decreasing her from oxycodone to hydrocodone. Also will try six-day steroid taper. I do feel with time she is going to get better. We will see her back in 1 month and we can have an AP and lateral of her left hip at that visit.

## 2016-04-02 ENCOUNTER — Telehealth (INDEPENDENT_AMBULATORY_CARE_PROVIDER_SITE_OTHER): Payer: Self-pay | Admitting: Orthopaedic Surgery

## 2016-04-02 NOTE — Telephone Encounter (Signed)
03/28/16 [facility] GAVE HER nORCO, MEDROL AND NEURONTIN. WOULD NOT EREFILL THE mEDROL

## 2016-04-02 NOTE — Telephone Encounter (Signed)
Please advise 

## 2016-04-03 MED ORDER — METHYLPREDNISOLONE 4 MG PO TABS: ORAL_TABLET | ORAL | 0 refills | Status: DC

## 2016-04-03 NOTE — Telephone Encounter (Signed)
I did send in meds

## 2016-04-04 ENCOUNTER — Ambulatory Visit: Payer: No Typology Code available for payment source | Admitting: Sports Medicine

## 2016-04-04 NOTE — Telephone Encounter (Signed)
Pt just called the pharmacy and stated medication was not there to pick up. [facility] [address] CB: [phone]

## 2016-04-04 NOTE — Telephone Encounter (Signed)
Called in.

## 2016-04-06 ENCOUNTER — Ambulatory Visit: Payer: No Typology Code available for payment source | Admitting: Pharmacist

## 2016-04-06 ENCOUNTER — Ambulatory Visit (INDEPENDENT_AMBULATORY_CARE_PROVIDER_SITE_OTHER): Payer: No Typology Code available for payment source | Admitting: Pulmonary Disease

## 2016-04-06 ENCOUNTER — Encounter: Payer: Self-pay | Admitting: Pulmonary Disease

## 2016-04-06 ENCOUNTER — Encounter (INDEPENDENT_AMBULATORY_CARE_PROVIDER_SITE_OTHER): Payer: Self-pay

## 2016-04-06 DIAGNOSIS — Z96642 Presence of left artificial hip joint: Secondary | ICD-10-CM

## 2016-04-06 DIAGNOSIS — G8929 Other chronic pain: Secondary | ICD-10-CM

## 2016-04-06 DIAGNOSIS — I10 Essential (primary) hypertension: Secondary | ICD-10-CM

## 2016-04-06 DIAGNOSIS — Z79899 Other long term (current) drug therapy: Secondary | ICD-10-CM

## 2016-04-06 DIAGNOSIS — M5134 Other intervertebral disc degeneration, thoracic region: Secondary | ICD-10-CM

## 2016-04-06 DIAGNOSIS — F17210 Nicotine dependence, cigarettes, uncomplicated: Secondary | ICD-10-CM

## 2016-04-06 MED ORDER — MELOXICAM 15 MG PO TABS: 15.0000 mg | ORAL_TABLET | Freq: Every day | ORAL | 0 refills | Status: DC

## 2016-04-06 MED ORDER — CYCLOBENZAPRINE HCL 10 MG PO TABS: 5.0000 mg | ORAL_TABLET | Freq: Three times a day (TID) | ORAL | 0 refills | Status: DC | PRN

## 2016-04-06 MED ORDER — HYDROCHLOROTHIAZIDE 25 MG PO TABS: 25.0000 mg | ORAL_TABLET | Freq: Every day | ORAL | 0 refills | Status: DC

## 2016-04-06 MED ORDER — HYDROCHLOROTHIAZIDE 12.5 MG PO CAPS: 12.5000 mg | ORAL_CAPSULE | Freq: Every day | ORAL | 0 refills | Status: DC

## 2016-04-06 MED FILL — MELOXICAM 15 MG TABLET: 15 | 14 days supply | Qty: 14 | Fill #0

## 2016-04-06 MED FILL — CYCLOBENZAPRINE 10 MG TAB: 10 | 20 days supply | Qty: 30 | Fill #0

## 2016-04-06 NOTE — Progress Notes (Signed)
   CC: back pain  HPI:  [name] is a 51 year old woman with history of arthritis, left THA on 9/15 ago presenting with acute on chronic upper back pain. She has had pain there for years. The pain is worse on the right compared to the left. It is aching in nature. She lies face down on a pillow and it helps relieve the pain. Lifting things makes it worse. She does not know what may have exacerbated this pain. She is on Vicodin. She has not started gabapentin. She takes Flexeril as well. These all help. She has tried using a heating pad and it helps. She is out of her Vicodin.   CT chest on 7/26: The nodular appearing opacity seen on recent chest radiograph is due to a sizable right lateral osteophyte at T8-9 on the right. There are areas of degenerative change in the lower thoracic spine.  She was seen by her orthpedic surgeon on 11/22 and he prescribed neurontin and decreased her from oxycodone to hydrocodone.   Past Medical History:  Diagnosis Date  . Arthritis   . Bronchitis    states at least 2-3 months ago  . Carpal tunnel syndrome    bilateral  . Depression   . GERD (gastroesophageal reflux disease)   . Headache    migraines  . Heart murmur   . Herpes   . Hypertension     Review of Systems:   No fevers or chills No weight loss No nausea, vomiting, abdominal pain  Physical Exam:  Vitals:   04/06/16 1025  BP: (!) 168/75  Pulse: 70  Temp: 98 F (36.7 C)  TempSrc: Oral  SpO2: 100%  Weight: 139 lb 1.6 oz (63.1 kg)  Height: 5\' 3"  (1.6 m)   General Apperance: NAD HEENT: Normocephalic, atraumatic, anicteric sclera Neck: Supple, trachea midline Lungs: Clear to auscultation bilaterally. No wheezes, rhonchi or rales. Breathing comfortably Heart: Regular rate and rhythm, no murmur/rub/gallop Abdomen: Soft, nontender, nondistended, no rebound/guarding Back: Mid thoracic tenderness to palpation. Tender to palpation along lateral upper back muscles. Extremities:  Warm and well perfused, no edema Skin: No rashes or lesions Neurologic: Alert and interactive. No gross deficits.  Assessment & Plan:   See Encounters Tab for problem based charting.  Patient discussed with Dr. Evette [name]

## 2016-04-06 NOTE — Assessment & Plan Note (Signed)
Assessment: BP 168/75 which is above goal. Some of this may be exacerbated by her chronic pain. She has been taking HCTZ 12.5mg  daily.  Plan: Increase HCTZ to 25mg  daily. Follow up in 3-4 weeks. Check BMP at that time.

## 2016-04-06 NOTE — Assessment & Plan Note (Signed)
She is concerned about her pain from her surgery. She has been following up with Dr. [name]. Discussed with her that recovery is variable and that her recovery may be more prolonged due to having had worse disease prior to the joint replacement compared to some of the people she knows who have had faster recovery.

## 2016-04-06 NOTE — Patient Instructions (Signed)
Continue your current home pain meds. Start taking meloxicam once a day. When your pain improves you can decrease it to half a tablet a day as needed. Follow up with your primary care provider in 1-2 months

## 2016-04-06 NOTE — Assessment & Plan Note (Signed)
Assessment: Acute on chronic thoracic back pain. She has degenerative disease in her lower thoracic spine seen on CT chest in July. No neurologic deficits from her degenerative disease  Plan: Mobic 15mg  daily. Continue Flexeril, Norco. Continue using heat.

## 2016-04-09 NOTE — Progress Notes (Signed)
Internal Medicine Clinic Attending  Case discussed with Dr. Krall at the time of the visit.  We reviewed the resident's history and exam and pertinent patient test results.  I agree with the assessment, diagnosis, and plan of care documented in the resident's note.  

## 2016-04-11 ENCOUNTER — Ambulatory Visit: Payer: Self-pay | Admitting: Sports Medicine

## 2016-04-13 ENCOUNTER — Ambulatory Visit: Payer: No Typology Code available for payment source

## 2016-04-16 ENCOUNTER — Ambulatory Visit (INDEPENDENT_AMBULATORY_CARE_PROVIDER_SITE_OTHER): Payer: No Typology Code available for payment source | Admitting: Internal Medicine

## 2016-04-16 ENCOUNTER — Encounter (INDEPENDENT_AMBULATORY_CARE_PROVIDER_SITE_OTHER): Payer: Self-pay | Admitting: Physician Assistant

## 2016-04-16 ENCOUNTER — Ambulatory Visit (INDEPENDENT_AMBULATORY_CARE_PROVIDER_SITE_OTHER): Payer: Self-pay | Admitting: Physician Assistant

## 2016-04-16 VITALS — Ht 63.0 in | Wt 139.0 lb

## 2016-04-16 VITALS — BP 162/103 | HR 80 | Temp 98.2°F | Wt 137.6 lb

## 2016-04-16 DIAGNOSIS — I10 Essential (primary) hypertension: Secondary | ICD-10-CM

## 2016-04-16 DIAGNOSIS — Z79899 Other long term (current) drug therapy: Secondary | ICD-10-CM

## 2016-04-16 DIAGNOSIS — M7062 Trochanteric bursitis, left hip: Secondary | ICD-10-CM

## 2016-04-16 DIAGNOSIS — Z87891 Personal history of nicotine dependence: Secondary | ICD-10-CM

## 2016-04-16 DIAGNOSIS — Z96642 Presence of left artificial hip joint: Secondary | ICD-10-CM

## 2016-04-16 MED ORDER — LIDOCAINE HCL 1 % IJ SOLN
3.0000 mL | INTRAMUSCULAR | Status: AC | PRN
  Administered 2016-04-16: 3 mL

## 2016-04-16 MED ORDER — AMLODIPINE BESYLATE 5 MG PO TABS: 5.0000 mg | ORAL_TABLET | Freq: Every day | ORAL | 11 refills | Status: DC

## 2016-04-16 MED ORDER — HYDROCODONE-ACETAMINOPHEN 5-325 MG PO TABS: 1.0000 | ORAL_TABLET | Freq: Three times a day (TID) | ORAL | 0 refills | Status: DC | PRN

## 2016-04-16 MED ORDER — METHYLPREDNISOLONE ACETATE 40 MG/ML IJ SUSP
40.0000 mg | INTRAMUSCULAR | Status: AC | PRN
  Administered 2016-04-16: 40 mg via INTRA_ARTICULAR

## 2016-04-16 MED FILL — HYDROCODON-APAP 5-325: 5-325 | 7 days supply | Qty: 40 | Fill #0

## 2016-04-16 NOTE — Patient Instructions (Signed)
Start taking Norvasc 5mg  once a day for your blood pressure.

## 2016-04-16 NOTE — Progress Notes (Signed)
Office Visit Note   Patient: Jean [name]           Date of Birth: [birth year]           MRN: [id number] Visit Date: 04/16/2016              Requested by: Dellia Nims, MD [place], [place] [address] PCP: Dellia Nims, MD   Assessment & Plan: Visit Diagnoses:  1. History of total left hip arthroplasty   2. Trochanteric bursitis, left hip     Plan: IT band stretching exercises shown. We'll see her back in 1 month obtain an AP and lateral view of the left hip at that time  Follow-Up Instructions: Return in about 4 weeks (around 05/14/2016) for Radiographs.   Orders:  Orders Placed This Encounter  Procedures  . Large Joint Injection/Arthrocentesis   Meds ordered this encounter  Medications  . HYDROcodone-acetaminophen (NORCO/VICODIN) 5-325 MG tablet    Sig: Take 1-2 tablets by mouth 3 (three) times daily as needed for moderate pain.    Dispense:  40 tablet    Refill:  0      Procedures: Large Joint Inj Date/Time: 04/16/2016 2:51 PM Performed by: Pete Pelt Authorized by: Pete Pelt   Consent Given by:  Patient Indications:  Pain Location:  Hip Site:  L greater trochanter Needle Size:  22 G Needle Length:  1.5 inches Approach:  Lateral Ultrasound Guidance: No   Fluoroscopic Guidance: No   Arthrogram: No   Medications:  40 mg methylPREDNISolone acetate 40 MG/ML; 3 mL lidocaine 1 % Aspiration Attempted: No   Patient tolerance:  Patient tolerated the procedure well with no immediate complications     Clinical Data: No additional findings.   Subjective: Chief Complaint  Patient presents with  . Left pelvic pain    Patient states pelvic pain on left side radiating into left leg down to her left knee.  Hurts to bend, sit, stand, and walk. Back pain on left side.  States it's not hurting where she had surgery.  Left total hip on 01/20/16. Ambulates with a cane. She states that her dental abscesses of all cleared and that she is waiting  to have implants.    Review of Systems   Objective: Vital Signs: Ht 5\' 3"  (1.6 m)   Wt 139 lb (63 kg)   LMP 04/04/2016   BMI 24.62 kg/m   Physical Exam  Ortho Exam Left hip good range of motion. Left hip surgical incision is well-healed no signs of infection. His tenderness over the left trochanteric region. She is unable to put full weight on the left hip secondary to pain Specialty Comments:  No specialty comments available.  Imaging: No results found.   PMFS History: Patient Active Problem List   Diagnosis Date Noted  . Uterine leiomyoma 02/16/2016  . Pain, dental 01/31/2016  . Osteoarthritis of left hip 01/20/2016  . Status post left hip replacement 01/20/2016  . Liver lesion 12/01/2015  . Bronchiectasis ([place]) 12/01/2015  . DDD (degenerative disc disease), thoracic 11/23/2015  . Abnormal CXR 10/19/2015  . Acute bronchitis 10/19/2015  . Situational depression 10/19/2015  . Dyspnea on exertion 08/22/2015  . HTN (hypertension) 07/08/2015  . Tear of acetabular labrum 07/08/2015  . Routine health maintenance 07/08/2015  . Tobacco abuse 07/08/2015   Past Medical History:  Diagnosis Date  . Arthritis   . Bronchitis    states at least 2-3 months ago  . Carpal tunnel syndrome  bilateral  . Depression   . GERD (gastroesophageal reflux disease)   . Headache    migraines  . Heart murmur   . Herpes   . Hypertension     Family History  Problem Relation Age of Onset  . Cancer Mother   . Hypertension Mother   . Diabetes Mother   . Stroke Father     Past Surgical History:  Procedure Laterality Date  . NO PAST SURGERIES    . SALPINGECTOMY    . TOTAL HIP ARTHROPLASTY Left 01/20/2016   Procedure: LEFT TOTAL HIP ARTHROPLASTY ANTERIOR APPROACH;  Surgeon: Mcarthur Rossetti, MD;  Location: [facility];  Service: Orthopedics;  Laterality: Left;   Social History   Occupational History  . Not on file.   Social History Main Topics  . Smoking status: Current  Every Day Smoker    Packs/day: 0.20    Types: Cigarettes    Last attempt to quit: 01/09/2016  . Smokeless tobacco: Never Used     Comment:  ABOUT 1 CIGARETTES EVERY 2-3DAYS  . Alcohol use No     Comment: stopped September 2017  . Drug use:     Types: "Crack" cocaine     Comment: states last used Sept 1 or 2  x 1  . Sexual activity: Yes    Partners: Male

## 2016-04-17 LAB — BMP8+ANION GAP
Anion Gap: 16 mmol/L (ref 10.0–18.0)
BUN/Creatinine Ratio: 18 (ref 9–23)
BUN: 18 mg/dL (ref 6–24)
CO2: 24 mmol/L (ref 18–29)
Calcium: 10.4 mg/dL — ABNORMAL HIGH (ref 8.7–10.2)
Chloride: 99 mmol/L (ref 96–106)
Creatinine, Ser: 0.98 mg/dL (ref 0.57–1.00)
GFR calc non Af Amer: 67 mL/min/{1.73_m2} (ref >59)
GFR, EST AFRICAN AMERICAN: 77 mL/min/{1.73_m2} (ref >59)
GLUCOSE: 88 mg/dL (ref 65–99)
Potassium: 4.9 mmol/L (ref 3.5–5.2)
Sodium: 139 mmol/L (ref 134–144)

## 2016-04-18 ENCOUNTER — Other Ambulatory Visit: Payer: Self-pay | Admitting: Sports Medicine

## 2016-04-18 ENCOUNTER — Ambulatory Visit (INDEPENDENT_AMBULATORY_CARE_PROVIDER_SITE_OTHER): Payer: Self-pay | Admitting: Sports Medicine

## 2016-04-18 ENCOUNTER — Ambulatory Visit
Admission: RE | Admit: 2016-04-18 | Discharge: 2016-04-18 | Disposition: A | Discharge status: Home / Self Care | Payer: No Typology Code available for payment source | Source: Ambulatory Visit | Attending: Sports Medicine | Admitting: Sports Medicine

## 2016-04-18 VITALS — BP 149/91 | Ht 62.0 in | Wt 137.0 lb

## 2016-04-18 DIAGNOSIS — G8929 Other chronic pain: Secondary | ICD-10-CM

## 2016-04-18 DIAGNOSIS — M25562 Pain in left knee: Secondary | ICD-10-CM

## 2016-04-18 DIAGNOSIS — M545 Low back pain, unspecified: Secondary | ICD-10-CM

## 2016-04-18 DIAGNOSIS — M25561 Pain in right knee: Secondary | ICD-10-CM

## 2016-04-18 NOTE — Progress Notes (Signed)
   CC: Hypertension  HPI:  Jean [name] is a 50 y.o. with past medical history as outlined below who presents to clinic for blood pressure follow-up. Please see problem list for patient's chronic medical issues.  Past Medical History:  Diagnosis Date  . Arthritis   . Bronchitis    states at least 2-3 months ago  . Carpal tunnel syndrome    bilateral  . Depression   . GERD (gastroesophageal reflux disease)   . Headache    migraines  . Heart murmur   . Herpes   . Hypertension     Review of Systems:  Positive for left leg pain that is chronic. She states pain is currently 5 out of 10. Denies headaches or pressure behind her eyes. Positive for leg swelling and foot cramping.   Physical Exam:  Vitals:   04/16/16 1540  BP: (!) 162/103  Pulse: 80  Temp: 98.2 F (36.8 C)  TempSrc: Oral  SpO2: 100%  Weight: 137 lb 9.6 oz (62.4 kg)   Physical Exam  Constitutional: She is oriented to person, place, and time. She appears well-developed and well-nourished. No distress.  HENT:  Head: Normocephalic and atraumatic.  Nose: Nose normal.  Cardiovascular: Normal rate, regular rhythm and normal heart sounds.  Exam reveals no gallop and no friction rub.   No murmur heard. Pulmonary/Chest: Effort normal and breath sounds normal. No respiratory distress. She has no wheezes. She has no rales.  Abdominal: Soft. Bowel sounds are normal. She exhibits no distension. There is no tenderness. There is no rebound and no guarding.  Neurological: She is alert and oriented to person, place, and time.  Skin: Skin is warm and dry. No rash noted. She is not diaphoretic. No erythema. No pallor.   Assessment & Plan:   See Encounters Tab for problem based charting.  Patient discussed with Dr. Lynnae January

## 2016-04-18 NOTE — Assessment & Plan Note (Signed)
Assessment: Patient here for follow-up blood pressure. Her hydrochlorothiazide was recently increased from 12.5 to 25 mg daily. On arrival her blood pressure is 162/103. On manual recheck by myself in the exam room her blood pressures were consistently in the 160s over 100s.  Plan: Basic metabolic panel ordered today within normal limits. Continue hydrochlorothiazide 25 mg daily. Started on Norvasc 5 mg once daily.
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
## 2016-04-20 ENCOUNTER — Other Ambulatory Visit: Payer: Self-pay | Admitting: *Deleted

## 2016-04-20 DIAGNOSIS — R29898 Other symptoms and signs involving the musculoskeletal system: Secondary | ICD-10-CM

## 2016-04-20 NOTE — Progress Notes (Signed)
Internal Medicine Clinic Attending  Case discussed with Dr. Ander Gaster at the time of the visit.  We reviewed the resident's history and exam and pertinent patient test results.  I agree with the assessment, diagnosis, and plan of care documented in the resident's note.

## 2016-04-23 ENCOUNTER — Encounter: Payer: Self-pay | Admitting: Internal Medicine

## 2016-04-23 ENCOUNTER — Ambulatory Visit (INDEPENDENT_AMBULATORY_CARE_PROVIDER_SITE_OTHER): Payer: No Typology Code available for payment source | Admitting: Internal Medicine

## 2016-04-23 VITALS — BP 155/71 | HR 73 | Temp 98.2°F | Ht 63.0 in | Wt 135.7 lb

## 2016-04-23 DIAGNOSIS — Z79891 Long term (current) use of opiate analgesic: Secondary | ICD-10-CM

## 2016-04-23 DIAGNOSIS — Z87891 Personal history of nicotine dependence: Secondary | ICD-10-CM

## 2016-04-23 DIAGNOSIS — M5134 Other intervertebral disc degeneration, thoracic region: Secondary | ICD-10-CM

## 2016-04-23 DIAGNOSIS — I10 Essential (primary) hypertension: Secondary | ICD-10-CM

## 2016-04-23 MED ORDER — IBUPROFEN 800 MG PO TABS: 800.0000 mg | ORAL_TABLET | Freq: Three times a day (TID) | ORAL | 0 refills | Status: DC | PRN

## 2016-04-23 MED ORDER — BACLOFEN 10 MG PO TABS: 10.0000 mg | ORAL_TABLET | Freq: Two times a day (BID) | ORAL | 0 refills | Status: DC | PRN

## 2016-04-23 NOTE — Progress Notes (Signed)
   CC: upper back pain  HPI:   [name] is a 50 y.o. with past medical history as outlined below who presents to clinic for acute worsening of her chronic back pain. Back pains located on her right shoulder that radiates to the left shoulder and across her chest. It has been present for 1 year and acutely worsened over the weekend. She thinks that she may have slept in the wrong position and reports she was only out of bed for 2 hours over the whole entire weekend. She rates back pain as 7 out of 10. She says it's hard for her to breathe and talking makes her feel short of breath secondary to her pain. She has been seen for this multiple times in clinic. She had a CT abdomen which revealed a right lateral osteophyte at T8-T9 with degenerative changes of the lower thoracic spine. At that time she was given Mobic and Flexeril. She only takes Flexeril at night because it makes her drowsy. She follows a sports medicine and is currently waiting a physical therapy referral. She is on Vicodin as prescribed from [facility]. She had 40 tablets prescribed to her on August 15 with directions of one to 2 tabs 3 times a day when necessary. She states that she is currently out of these medications. Moving makes pain worse and staying still helps alleviate pain.  Past Medical History:  Diagnosis Date  . Arthritis   . Bronchitis    states at least 2-3 months ago  . Carpal tunnel syndrome    bilateral  . Depression   . GERD (gastroesophageal reflux disease)   . Headache    migraines  . Heart murmur   . Herpes   . Hypertension     Review of Systems:  Has baseline tingling in bilateral hands from her carpal tunnel syndrome. Denies weakness, urinary or bowel incontinence.  Physical Exam:  Vitals:   04/23/16 1538  BP: (!) 155/71  Pulse: 73  Temp: 98.2 F (36.8 C)  TempSrc: Oral  SpO2: 100%  Weight: 135 lb 11.2 oz (61.6 kg)  Height: 5\' 3"  (1.6 m)   Physical Exam  Constitutional: appears  well-developed and well-nourished. No distress.  HENT:  Head: Normocephalic and atraumatic.  Nose: Nose normal.  Cardiovascular: Normal rate, regular rhythm and normal heart sounds.  Exam reveals no gallop and no friction rub.   No murmur heard. Pulmonary/Chest: Effort normal and breath sounds normal. No respiratory distress.  has no wheezes.no rales.  Neurological: alert and oriented to person, place, and time.  Skin: Skin is warm and dry. No rash noted.  not diaphoretic. No erythema. No pallor.  Musculoskeletal: Patient is tender to palpation over her right scapula and all along her right side. She has tenderness to palpation of her spine in the lower thoracic region. She is also tender over the left side of her back though states is not as painful as the right. She has tenderness to palpation below her right breast Assessment & Plan:   See Encounters Tab for problem based charting.  Patient discussed with Dr. Daryll Drown

## 2016-04-23 NOTE — Patient Instructions (Signed)
Start taking baclofen 10mg  twice a day as needed. Do not take flexeril at the same time.   Take Ibuprofen 800mg  three time a day as needed for pain.   Use aromatherapy as directed.

## 2016-04-24 NOTE — Assessment & Plan Note (Addendum)
Patient's blood pressure is 155/71. She was recently started on Norvasc last however Patient's Norvasc has not been delivered yet. Follow-up in one month for blood pressure check.

## 2016-04-24 NOTE — Assessment & Plan Note (Addendum)
Assessment: Patient has had chronic back pain for the past year with acute worsening over the weekend. On exam she is diffusely tender to palpation of her back and beneath her right breast. Moving makes her pain worse. Her symptoms are consistent with a musculoskeletal etiology.  Plan: Rx for baclofen and ibuprofen. Physical therapy referral placed by sports medicine. Patient also given comfort aromatherapy initiated on how to use it. Consent signed.

## 2016-04-25 ENCOUNTER — Telehealth (INDEPENDENT_AMBULATORY_CARE_PROVIDER_SITE_OTHER): Payer: Self-pay | Admitting: Orthopaedic Surgery

## 2016-04-25 ENCOUNTER — Ambulatory Visit (INDEPENDENT_AMBULATORY_CARE_PROVIDER_SITE_OTHER): Payer: No Typology Code available for payment source | Admitting: Orthopaedic Surgery

## 2016-04-25 NOTE — Telephone Encounter (Signed)
Patient requesting Rx for pain L hip.  She has taken been taking Vicodin.  Patient can pick up.  She has 2 left.

## 2016-04-26 MED ORDER — HYDROCODONE-ACETAMINOPHEN 5-325 MG PO TABS: 1.0000 | ORAL_TABLET | Freq: Three times a day (TID) | ORAL | 0 refills | Status: DC | PRN

## 2016-04-26 MED FILL — HYDROCODON-APAP 5-325: 5-325 | 10 days supply | Qty: 60 | Fill #0

## 2016-04-26 NOTE — Telephone Encounter (Signed)
Please advise 

## 2016-04-26 NOTE — Telephone Encounter (Signed)
Patient aware Rx ready at front desk  

## 2016-04-26 NOTE — Telephone Encounter (Signed)
Can come and pick up script 

## 2016-04-27 NOTE — Progress Notes (Signed)
Internal Medicine Clinic Attending  Case discussed with Dr. Truong soon after the resident saw the patient.  We reviewed the resident's history and exam and pertinent patient test results.  I agree with the assessment, diagnosis, and plan of care documented in the resident's note. 

## 2016-05-01 ENCOUNTER — Ambulatory Visit: Payer: No Typology Code available for payment source | Attending: Sports Medicine | Admitting: Physical Therapy

## 2016-05-10 ENCOUNTER — Encounter: Payer: Self-pay | Admitting: Internal Medicine

## 2016-05-10 DIAGNOSIS — G894 Chronic pain syndrome: Secondary | ICD-10-CM | POA: Insufficient documentation

## 2016-05-11 ENCOUNTER — Ambulatory Visit: Payer: No Typology Code available for payment source | Attending: Sports Medicine | Admitting: Physical Therapy

## 2016-05-11 DIAGNOSIS — M6281 Muscle weakness (generalized): Secondary | ICD-10-CM | POA: Insufficient documentation

## 2016-05-11 DIAGNOSIS — M79605 Pain in left leg: Secondary | ICD-10-CM | POA: Insufficient documentation

## 2016-05-11 DIAGNOSIS — R2689 Other abnormalities of gait and mobility: Secondary | ICD-10-CM | POA: Insufficient documentation
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
## 2016-05-14 ENCOUNTER — Ambulatory Visit (INDEPENDENT_AMBULATORY_CARE_PROVIDER_SITE_OTHER): Payer: No Typology Code available for payment source | Admitting: Internal Medicine

## 2016-05-14 ENCOUNTER — Ambulatory Visit: Payer: No Typology Code available for payment source

## 2016-05-14 ENCOUNTER — Encounter: Payer: Self-pay | Admitting: Internal Medicine

## 2016-05-14 VITALS — BP 139/87 | HR 78 | Temp 98.2°F | Ht 63.0 in | Wt 135.9 lb

## 2016-05-14 DIAGNOSIS — M25552 Pain in left hip: Secondary | ICD-10-CM

## 2016-05-14 DIAGNOSIS — Z79899 Other long term (current) drug therapy: Secondary | ICD-10-CM

## 2016-05-14 DIAGNOSIS — I10 Essential (primary) hypertension: Secondary | ICD-10-CM

## 2016-05-14 DIAGNOSIS — Z79891 Long term (current) use of opiate analgesic: Secondary | ICD-10-CM

## 2016-05-14 DIAGNOSIS — G8929 Other chronic pain: Secondary | ICD-10-CM

## 2016-05-14 DIAGNOSIS — M25562 Pain in left knee: Secondary | ICD-10-CM

## 2016-05-14 DIAGNOSIS — M2578 Osteophyte, vertebrae: Secondary | ICD-10-CM

## 2016-05-14 DIAGNOSIS — Z23 Encounter for immunization: Secondary | ICD-10-CM

## 2016-05-14 DIAGNOSIS — Z96642 Presence of left artificial hip joint: Secondary | ICD-10-CM

## 2016-05-14 DIAGNOSIS — F17210 Nicotine dependence, cigarettes, uncomplicated: Secondary | ICD-10-CM

## 2016-05-14 DIAGNOSIS — Z1239 Encounter for other screening for malignant neoplasm of breast: Secondary | ICD-10-CM

## 2016-05-14 DIAGNOSIS — M5134 Other intervertebral disc degeneration, thoracic region: Secondary | ICD-10-CM

## 2016-05-14 MED ORDER — CYCLOBENZAPRINE HCL 10 MG PO TABS: 5.0000 mg | ORAL_TABLET | Freq: Three times a day (TID) | ORAL | 0 refills | Status: DC | PRN

## 2016-05-14 NOTE — Assessment & Plan Note (Signed)
Continues to have pain. Started PT just recently.   Currently taking vicodin 5-325mg  two tablets every 8 hours on average. Not using meloxicam anymore (did not help). Has not picked up the ibuprofen yet. Using flexeril 3 times daily.    Refilled flexeril. Asked her to pickup and take the ibuprofen.  F/up with ortho.

## 2016-05-14 NOTE — Assessment & Plan Note (Addendum)
Continues to have right sided upper/mid back pain.  Continue NSAID.  Asked to talk to her PT about showing her some upper back exercises as well.  Is planning to See Dr. Lorin Mercy for her upper back.

## 2016-05-14 NOTE — Assessment & Plan Note (Signed)
Vitals:   05/14/16 1442  BP: 139/87  Pulse: 78  Temp: 98.2 F (36.8 C)   BP well controlled today. Cont hctz 25mg  daily and amlodipine 5mg  daily.

## 2016-05-14 NOTE — Progress Notes (Signed)
   CC: HTN follow up and chronic pain.   HPI:  Jean [name] is a 51 y.o. pmh as listed below presents for f/up of HTN   Past Medical History:  Diagnosis Date  . Arthritis   . Bronchitis    states at least 2-3 months ago  . Carpal tunnel syndrome    bilateral  . Depression   . GERD (gastroesophageal reflux disease)   . Headache    migraines  . Heart murmur   . Herpes   . Hypertension     Has chronic back and hip pain likely from DDD of T spine and hip OA. Sizable osteophyte visible on T8-T9 on imaging. Had left total hip replacement on 01/2016. Has tried meds including flexeril, advil, tylenol, aleve, and meloxicam Continues to have left back and hip pain after the surgery. Has been following ortho Dr. [name]. Has been on opioids and neurontin was also added. Was put on neurontin along with steroid taper.   Currently taking vicodin 5-325mg  two tablets every 8 hours on average. Not using meloxicam anymore (did not help). Has not picked up the ibuprofen yet. Using flexeril 3 times daily.  Today her back pain is 8/10. Started PT recently.   Pain is located on left hip/groin area with with radiation to the knee. Hurts to walk. Resting helps the pain.  Tspine pain is upper/mid back on the right side. No weakness, numbness.   HTN: Her BP was elevated last few visits and meds were adjusted. Currently she is on hctz 25mg  daily + amlodipine 5mg  daily.   Health maintenance: ordered mammogram. Asked to make separate appt for pap smear.    Review of Systems:   Review of Systems  Constitutional: Negative for chills, fever and weight loss.  Cardiovascular: Negative for chest pain and palpitations.  Gastrointestinal: Negative for abdominal pain, nausea and vomiting.  Genitourinary: Negative for dysuria.  Musculoskeletal: Positive for joint pain. Negative for falls.  Neurological: Negative for dizziness, sensory change and focal weakness.     Physical Exam:  Vitals:   05/14/16 1442  BP: 139/87  Pulse: 78  Temp: 98.2 F (36.8 C)  TempSrc: Oral  SpO2: 100%  Weight: 135 lb 14.4 oz (61.6 kg)  Height: 5\' 3"  (1.6 m)   Physical Exam  Constitutional: She is oriented to person, place, and time. She appears well-developed and well-nourished. No distress.  HENT:  Head: Normocephalic and atraumatic.  Eyes: Conjunctivae are normal. Right eye exhibits no discharge. Left eye exhibits no discharge.  Neck: Normal range of motion. Neck supple.  Cardiovascular: Normal rate and regular rhythm.  Exam reveals no gallop and no friction rub.   No murmur heard. Respiratory: Effort normal and breath sounds normal. No respiratory distress. She has no wheezes. She has no rales. She exhibits no tenderness.  Musculoskeletal:  ROM on left hip and left knee is limited to pain. Good 5/5 strgth bilaterally upper and lower exts. Sensation intact.   Neurological: She is alert and oriented to person, place, and time.  Skin: She is not diaphoretic.      Assessment & Plan:   See Encounters Tab for problem based charting.  Patient discussed with Dr. Angelia Mould

## 2016-05-14 NOTE — Patient Instructions (Signed)
Please continue your current regimen for pain. Pick up the iburpofen.  Your BP was good today. Keep taking your BP meds as you were.  Follow up with me in 3 months.

## 2016-05-15 ENCOUNTER — Ambulatory Visit (INDEPENDENT_AMBULATORY_CARE_PROVIDER_SITE_OTHER): Payer: No Typology Code available for payment source | Admitting: Orthopaedic Surgery

## 2016-05-15 ENCOUNTER — Ambulatory Visit: Payer: No Typology Code available for payment source | Admitting: Physical Therapy

## 2016-05-15 NOTE — Progress Notes (Signed)
Internal Medicine Clinic Attending  Case discussed with Dr. Ahmed at the time of the visit.  We reviewed the resident's history and exam and pertinent patient test results.  I agree with the assessment, diagnosis, and plan of care documented in the resident's note. 

## 2016-05-17 ENCOUNTER — Other Ambulatory Visit: Payer: Self-pay | Admitting: Internal Medicine

## 2016-05-17 ENCOUNTER — Telehealth (INDEPENDENT_AMBULATORY_CARE_PROVIDER_SITE_OTHER): Payer: Self-pay | Admitting: *Deleted

## 2016-05-17 MED ORDER — HYDROCODONE-ACETAMINOPHEN 5-325 MG PO TABS: 1.0000 | ORAL_TABLET | Freq: Three times a day (TID) | ORAL | 0 refills | Status: DC | PRN

## 2016-05-17 NOTE — Telephone Encounter (Signed)
Please advise 

## 2016-05-17 NOTE — Telephone Encounter (Signed)
Pt called stating she needs refill Vicodin

## 2016-05-17 NOTE — Telephone Encounter (Signed)
Can come and pick up just one more prescription for narcotics and will need to ween from there since she is over 3 months post-surgery

## 2016-05-18 ENCOUNTER — Ambulatory Visit: Payer: No Typology Code available for payment source

## 2016-05-18 ENCOUNTER — Ambulatory Visit: Payer: No Typology Code available for payment source | Admitting: Physical Therapy

## 2016-05-18 MED FILL — HYDROCODON-APAP 5-325: 5-325 | 10 days supply | Qty: 60 | Fill #0

## 2016-05-18 NOTE — Telephone Encounter (Signed)
LMOM for patient

## 2016-05-21 ENCOUNTER — Ambulatory Visit: Payer: No Typology Code available for payment source | Admitting: Physical Therapy

## 2016-05-23 ENCOUNTER — Ambulatory Visit (INDEPENDENT_AMBULATORY_CARE_PROVIDER_SITE_OTHER): Payer: No Typology Code available for payment source | Admitting: Orthopaedic Surgery

## 2016-05-24 ENCOUNTER — Ambulatory Visit: Payer: No Typology Code available for payment source | Admitting: Physical Therapy

## 2016-05-28 ENCOUNTER — Ambulatory Visit: Payer: No Typology Code available for payment source | Admitting: Physical Therapy

## 2016-05-31 ENCOUNTER — Ambulatory Visit: Payer: No Typology Code available for payment source | Admitting: Physical Therapy

## 2016-06-04 ENCOUNTER — Ambulatory Visit: Payer: No Typology Code available for payment source | Admitting: Physical Therapy

## 2016-06-05 ENCOUNTER — Ambulatory Visit (INDEPENDENT_AMBULATORY_CARE_PROVIDER_SITE_OTHER): Payer: Self-pay

## 2016-06-05 ENCOUNTER — Ambulatory Visit (INDEPENDENT_AMBULATORY_CARE_PROVIDER_SITE_OTHER): Payer: Self-pay | Admitting: Physician Assistant

## 2016-06-05 ENCOUNTER — Encounter (INDEPENDENT_AMBULATORY_CARE_PROVIDER_SITE_OTHER): Payer: Self-pay | Admitting: Orthopaedic Surgery

## 2016-06-05 DIAGNOSIS — M25552 Pain in left hip: Secondary | ICD-10-CM

## 2016-06-05 DIAGNOSIS — M545 Low back pain, unspecified: Secondary | ICD-10-CM

## 2016-06-05 DIAGNOSIS — M549 Dorsalgia, unspecified: Secondary | ICD-10-CM

## 2016-06-05 MED ORDER — HYDROCODONE-ACETAMINOPHEN 5-325 MG PO TABS: 1.0000 | ORAL_TABLET | Freq: Three times a day (TID) | ORAL | 0 refills | Status: DC | PRN

## 2016-06-05 MED FILL — HYDROCODON-APAP 5-325: 5-325 | 10 days supply | Qty: 60 | Fill #0
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
## 2016-06-06 ENCOUNTER — Telehealth (INDEPENDENT_AMBULATORY_CARE_PROVIDER_SITE_OTHER): Payer: Self-pay | Admitting: Orthopaedic Surgery

## 2016-06-06 ENCOUNTER — Other Ambulatory Visit (INDEPENDENT_AMBULATORY_CARE_PROVIDER_SITE_OTHER): Payer: Self-pay

## 2016-06-06 MED ORDER — TRAMADOL HCL 50 MG PO TABS: ORAL_TABLET | ORAL | 0 refills | Status: DC

## 2016-06-06 NOTE — Telephone Encounter (Signed)
Called into pharmacy

## 2016-06-06 NOTE — Telephone Encounter (Signed)
Patient states she's having a reaction to the [place] & request something else for pain. She states she do not need anything stronger. Pharmacy  [facility]

## 2016-06-06 NOTE — Telephone Encounter (Signed)
Please advise 

## 2016-06-06 NOTE — Telephone Encounter (Signed)
tramadol 

## 2016-06-06 NOTE — Progress Notes (Unsigned)
tramadol 

## 2016-06-07 ENCOUNTER — Ambulatory Visit: Payer: No Typology Code available for payment source | Attending: Sports Medicine | Admitting: Physical Therapy

## 2016-06-28 ENCOUNTER — Other Ambulatory Visit: Payer: Self-pay | Admitting: Pharmacist

## 2016-06-28 DIAGNOSIS — R0609 Other forms of dyspnea: Principal | ICD-10-CM

## 2016-06-28 DIAGNOSIS — I10 Essential (primary) hypertension: Secondary | ICD-10-CM

## 2016-06-29 MED ORDER — HYDROCHLOROTHIAZIDE 25 MG PO TABS: 25.0000 mg | ORAL_TABLET | Freq: Every day | ORAL | 0 refills | Status: DC

## 2016-06-29 MED ORDER — ALBUTEROL SULFATE HFA 108 (90 BASE) MCG/ACT IN AERS: 1.0000 | INHALATION_SPRAY | Freq: Four times a day (QID) | RESPIRATORY_TRACT | 2 refills | Status: DC | PRN

## 2016-07-09 ENCOUNTER — Telehealth: Payer: Self-pay

## 2016-07-09 ENCOUNTER — Ambulatory Visit (INDEPENDENT_AMBULATORY_CARE_PROVIDER_SITE_OTHER): Payer: Self-pay | Admitting: Sports Medicine

## 2016-07-09 VITALS — BP 156/98

## 2016-07-09 DIAGNOSIS — M25562 Pain in left knee: Secondary | ICD-10-CM

## 2016-07-09 DIAGNOSIS — M542 Cervicalgia: Secondary | ICD-10-CM

## 2016-07-09 NOTE — Progress Notes (Signed)
  Patient comes in today for referral to physical therapy. She has chronic upper, mid, and low back pain. Also chronic left knee pain and weakness. A referral was placed for [facility]. She may graduate to a home exercise program per the therapist's discretion. Follow-up with me as needed.

## 2016-07-09 NOTE — Telephone Encounter (Signed)
Questions about meds. Please call back.  

## 2016-07-09 NOTE — Telephone Encounter (Signed)
Lm, will call back this pm

## 2016-07-10 ENCOUNTER — Ambulatory Visit: Payer: Self-pay | Attending: Sports Medicine | Admitting: Physical Therapy

## 2016-07-10 DIAGNOSIS — M25552 Pain in left hip: Secondary | ICD-10-CM | POA: Insufficient documentation

## 2016-07-10 DIAGNOSIS — R2689 Other abnormalities of gait and mobility: Secondary | ICD-10-CM | POA: Insufficient documentation

## 2016-07-10 DIAGNOSIS — M79605 Pain in left leg: Secondary | ICD-10-CM | POA: Insufficient documentation

## 2016-07-10 DIAGNOSIS — M6281 Muscle weakness (generalized): Secondary | ICD-10-CM | POA: Insufficient documentation

## 2016-07-10 DIAGNOSIS — M546 Pain in thoracic spine: Secondary | ICD-10-CM | POA: Insufficient documentation

## 2016-07-10 NOTE — Patient Instructions (Signed)
On Elbows (Prone)    Rise up on elbows as high as possible, keeping hips on floor. Hold __30__ seconds. Repeat _5___ times per set. Do __1__ sets per session. Do __2__ sessions per day.  http://orth.exer.us/93   Copyright  VHI. All rights reserved.  Please bring in your exercises so we can check them!

## 2016-07-11 NOTE — Addendum Note (Signed)
Addended by: Raeford Razor L on: 07/11/2016 10:57 AM   Modules accepted: Orders

## 2016-07-11 NOTE — Therapy (Signed)
[place], [place], [address] Phone: [phone]   Fax:  [phone]  Physical Therapy Evaluation  Patient Details  Name: Jean [name] MRN: [id number] Date of Birth: 27-Sep-1965 Referring Provider: Dr. Lilia Argue   Encounter Date: 07/10/2016      PT End of Session - 07/11/16 1037    Visit Number 1   Number of Visits 16   Date for PT Re-Evaluation 09/05/16   Authorization Type [employer] - covered at 100%   Authorization Time Period expires 11/14/16   PT Start Time 1416   PT Stop Time 1455   PT Time Calculation (min) 39 min   Activity Tolerance Patient tolerated treatment well;Patient limited by pain   Behavior During Therapy [facility] for tasks assessed/performed      Past Medical History:  Diagnosis Date  . Arthritis   . Bronchitis    states at least 2-3 months ago  . Carpal tunnel syndrome    bilateral  . Depression   . GERD (gastroesophageal reflux disease)   . Headache    migraines  . Heart murmur   . Herpes   . Hypertension     Past Surgical History:  Procedure Laterality Date  . NO PAST SURGERIES    . SALPINGECTOMY    . TOTAL HIP ARTHROPLASTY Left 01/20/2016   Procedure: LEFT TOTAL HIP ARTHROPLASTY ANTERIOR APPROACH;  Surgeon: Mcarthur Rossetti, MD;  Location: [facility];  Service: Orthopedics;  Laterality: Left;    There were no vitals filed for this visit.       Subjective Assessment - 07/10/16 1418    Subjective Pt presents with upper back pain and L hip and L knee pain. She had THR on 01/20/16 with Dr. [name], had HHPT but not outpatient.   Pain in back travels posteriorly and to her L calf. She continues to have hip pain, knee pain (enitre leg actually).  She is unable to lay on her L. side.  She has difficulty bending her L knee for normal transfers.  She has difficulty getting in and of the shower, standing for cooking and mobility in general.  Her upper back pain has been  ongoing, and some degree of low back pain as well.    Pertinent History see Epic- L total hip 01/2016  by Dr. [name] . [facility] but financially she could not complete visits.    Limitations Sitting;Lifting;Standing;Walking;House hold activities;Other (comment)  Takes meds for sleep   How long can you sit comfortably? does not sit equally on pelvis    How long can you stand comfortably? can hold a cane and be up 10 min    How long can you walk comfortably? 10 min    Diagnostic tests T8-T9 bone spurs per CT in July 2017.  Knee XR shows tricompartmental changes.  Lumbar spine with L5-S1 facetic arthritis changes    Patient Stated Goals walk without pain (okay with using cane, but would like to not have to use), return to work   Currently in Pain? Yes   Pain Score 5    Pain Location Back   Pain Orientation Upper   Pain Descriptors / Indicators Sore   Pain Type Chronic pain   Pain Onset More than a month ago   Pain Frequency Intermittent   Aggravating Factors  wearing a bra sitting up straight    Pain Relieving Factors lying down    Effect of Pain  on Daily Activities never comfortable, unable to work    Multiple Pain Sites Yes   Pain Score 7   Pain Location Hip   Pain Orientation Left   Pain Descriptors / Indicators Aching;Throbbing   Pain Type Chronic pain;Surgical pain   Pain Onset More than a month ago   Pain Frequency Constant   Aggravating Factors  weightbearing, lifting leg    Pain Relieving Factors sitting, lying down, heat and meds   Pain Score 6   Pain Location Knee   Pain Orientation Left   Pain Descriptors / Indicators Aching   Pain Type Chronic pain   Pain Onset More than a month ago   Pain Frequency Intermittent   Aggravating Factors  walking, pressure on the other knee in sidelying    Pain Relieving Factors positioning, rest             [facility] PT Assessment - 07/10/16 1425      Assessment   Medical Diagnosis knee pain, back pain     Referring Provider Dr. Lilia Argue    Onset Date/Surgical Date 01/20/16  R knee injury from [facility] in 1996   Next MD Visit --   Prior Therapy following d/c from hospital HHPT     Precautions   Precautions None   Precaution Comments no driving     Restrictions   Weight Bearing Restrictions No     Balance Screen   Has the patient fallen in the past 6 months No   Has the patient had a decrease in activity level because of a fear of falling?  Yes   Is the patient reluctant to leave their home because of a fear of falling?  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   Leisure not much , has to be sedentary      Cognition   Overall Cognitive Status Within Functional Limits for tasks assessed     Observation/Other Assessments   Focus on Therapeutic Outcomes (FOTO)  77%     Sensation   Light Touch Impaired by gross assessment   Additional Comments numb prox hip L      Posture/Postural Control   Posture/Postural Control Postural limitations   Postural Limitations Rounded Shoulders;Forward head;Increased lumbar lordosis;Flexed trunk;Weight shift right   Posture Comments decr WB on LLE, can do if cued   feels like her LLE Won't hold her      AROM   Left Knee Extension 0  painful    Left Knee Flexion 132     PROM   Left Hip Flexion 80   Left Hip External Rotation  25   Left Hip Internal Rotation  20     Strength   Overall  Strength Comments pt attempting to use substitution patterns with MMT for LLE   Right Hip Flexion 5/5   Right Hip Extension 5/5   Right Hip ABduction 5/5   Left Hip Flexion 2/5   Left Hip  Extension 3-/5   Left Hip ABduction 3-/5   Right Knee Flexion 5/5   Right Knee Extension 5/5   Left Knee Flexion 3/5   Left Knee Extension 3-/5   Right Ankle Dorsiflexion 5/5   Left Ankle Dorsiflexion 3/5     Flexibility   Soft Tissue Assessment /Muscle Length --  unable to measure hamstrings due to POS SLR     Palpation   Palpation comment unable to tolerate palpation to midthoracic R and into lumbar paraspinals bilaterally.  L erector spinae hypertonic.  Mild medial knee pain.  Sore prox lateral L thigh and pain in L glutes, lateral SI border      Straight Leg Raise   Findings Positive   Side  Left   Comment pain <20 deg in back                              PT Short Term Goals - 07/11/16 1045      PT SHORT TERM GOAL #1   Title independent with initial HEP   Time 4   Period Weeks   Status New     PT SHORT TERM GOAL #2   Title report pain in L hip/pelvis < 5/10 with ADLs for improved activity tolerance   Time 4   Status New     PT SHORT TERM GOAL #3   Title tolerate standing > 15 min without increase in pain for ability to perform cooking activities   Time 4   Period Weeks   Status New     PT SHORT TERM GOAL #4   Title Demo L knee strength 3+/5 for greater stability during gait and transfers    Time 4   Period Weeks   Status New           PT Long Term Goals - 07/11/16 1046      PT LONG TERM GOAL #1   Title independent with advanced HEP   Time 8   Period Weeks   Status New     PT LONG TERM GOAL #2   Title stand with equal weightbearing and no increase in pain    Time 8   Period Weeks   Status New     PT LONG TERM GOAL #3   Title amb > 15 min without cane with min increase in pain for improved activity tolerance   Time 8   Period Weeks    Status New     PT LONG TERM GOAL #4   Title improve LLE strength to at least 4/5 for improved strength and function   Time 8   Period Weeks   Status New     PT LONG TERM GOAL #5   Title demonstrate 5 reps sit to/from stand without UE support for improved functional strength   Time 8   Period Weeks   Status New               Plan - 07/11/16 1039    Clinical Impression Statement Pt with mod complexity eval of pain in back, LLE.  She understands that because her L hip has continued to hurt, she has likely compensated with abnormal gait.  She has degenerative changes in her knee but I feel her pain is radicular from her Lumbar spine, given her pain, sensory and motor distribution pattern.  DF is 3/5 on LLE. Gr. Toe is 3-/5.  Lying prone (with pillow) reduced her  leg pain.  She has multiple issues going on, she will benefit from skilled PT to address pain and functional mobility.    Rehab Potential Good   Clinical Impairments Affecting Rehab Potential hx of chronic pain with long term opiod use   PT Frequency 2x / week   PT Duration 8 weeks   PT Treatment/Interventions ADLs/Self Care Home Management;Cryotherapy;Electrical Stimulation;Moist Heat;Ultrasound;Neuromuscular re-education;Balance training;Therapeutic exercise;Therapeutic activities;Functional mobility training;Stair training;Gait training;DME Instruction;Patient/family education;Passive range of motion;Manual techniques;Scar mobilization;Taping   PT Next Visit Plan establish HEP for LLE strengthening, prone to centralize pain. work on functional activities to increase LLE use (sit to/from stand, bed mobility, etc-avoids LLE use due to pain), gait and balance   PT Home Exercise Plan prone on elbows, posture   Consulted and Agree with Plan of Care Patient      Patient will benefit from skilled therapeutic intervention in order to improve the following deficits and impairments:  Abnormal gait, Pain, Decreased strength, Decreased  balance, Decreased mobility, Difficulty walking, Decreased range of motion, Decreased activity tolerance, Impaired UE functional use, Postural dysfunction, Impaired sensation, Impaired flexibility, Improper body mechanics, Increased fascial restricitons  Visit Diagnosis: Muscle weakness (generalized)  Other abnormalities of gait and mobility  Pain in left hip  Pain in thoracic spine  Pain in left leg     Problem List Patient Active Problem List   Diagnosis Date Noted  . Opioid dependence ([place]) 05/10/2016  . Uterine leiomyoma 02/16/2016  . Pain, dental 01/31/2016  . Chronic hip pain, left 01/20/2016  . Status post left hip replacement 01/20/2016  . Liver lesion 12/01/2015  . Bronchiectasis ([place]) 12/01/2015  . DDD (degenerative disc disease), thoracic 11/23/2015  . Situational depression 10/19/2015  . Dyspnea on exertion 08/22/2015  . HTN (hypertension) 07/08/2015  . Routine health maintenance 07/08/2015  . Tobacco abuse 07/08/2015    Jean [name] 07/11/2016, 10:49 AM  [facility] [address] [place], [place], [address] Phone: [phone]   Fax:  [phone]  Name: Jean [name] MRN: [id number] Date of Birth: 1966/01/27   Raeford Razor, PT 07/11/16 10:55 AM Phone: [phone] Fax: [phone]

## 2016-07-16 ENCOUNTER — Encounter: Payer: Self-pay | Admitting: Physical Therapy

## 2016-07-16 ENCOUNTER — Ambulatory Visit: Payer: Self-pay | Admitting: Physical Therapy

## 2016-07-16 DIAGNOSIS — R2689 Other abnormalities of gait and mobility: Secondary | ICD-10-CM

## 2016-07-16 DIAGNOSIS — M6281 Muscle weakness (generalized): Secondary | ICD-10-CM

## 2016-07-16 DIAGNOSIS — M79605 Pain in left leg: Secondary | ICD-10-CM

## 2016-07-16 DIAGNOSIS — M546 Pain in thoracic spine: Secondary | ICD-10-CM

## 2016-07-16 DIAGNOSIS — M25552 Pain in left hip: Secondary | ICD-10-CM

## 2016-07-16 NOTE — Therapy (Signed)
[place], [place], [address] Phone: [phone]   Fax:  [phone]  Physical Therapy Treatment  Patient Details  Name: Jean [name] MRN: [id number] Date of Birth: 1965/10/28 Referring Provider: Dr. Lilia Argue   Encounter Date: 07/16/2016      PT End of Session - 07/16/16 1152    Visit Number 2   Number of Visits 16   Date for PT Re-Evaluation 09/05/16   PT Start Time 1103   PT Stop Time 1200   PT Time Calculation (min) 57 min   Activity Tolerance Patient tolerated treatment well   Behavior During Therapy [facility] for tasks assessed/performed      Past Medical History:  Diagnosis Date  . Arthritis   . Bronchitis    states at least 2-3 months ago  . Carpal tunnel syndrome    bilateral  . Depression   . GERD (gastroesophageal reflux disease)   . Headache    migraines  . Heart murmur   . Herpes   . Hypertension     Past Surgical History:  Procedure Laterality Date  . NO PAST SURGERIES    . SALPINGECTOMY    . TOTAL HIP ARTHROPLASTY Left 01/20/2016   Procedure: LEFT TOTAL HIP ARTHROPLASTY ANTERIOR APPROACH;  Surgeon: Mcarthur Rossetti, MD;  Location: [facility];  Service: Orthopedics;  Laterality: Left;    There were no vitals filed for this visit.      Subjective Assessment - 07/16/16 1104    Subjective Has been doing exercises issues and working on her posture.    Currently in Pain? Yes   Pain Score 6   None now   Pain Location Back   Pain Orientation Upper   Pain Descriptors / Indicators Sore   Pain Type Chronic pain   Pain Frequency Intermittent   Aggravating Factors  wearing bra, laying on side right, sitting    Pain Relieving Factors meds                         [facility] Adult PT Treatment/Exercise - 07/16/16 0001      Self-Care   Self-Care --  Sleeping posture,  sitting posture,  call 911 if Heart sympt     Lumbar Exercises: Supine   Other Supine  Lumbar Exercises Decompression 2 pillows under each leg,  for leg press,  leg lengthener.  5 x 5 seconds each.     Moist Heat Therapy   Number Minutes Moist Heat 15 Minutes   Moist Heat Location Lumbar Spine;Hip     Manual Therapy   Manual Therapy Edema management;Soft tissue mobilization   Edema Management thigh right   Soft tissue mobilization thigh right,  retrograde soft tissue work/'k                PT Education - 07/16/16 1151    Education provided Yes   Education Details HEP.  posture,    Call 911 /ER if  you think you are having a Heart attack   Person(s) Educated Patient   Methods Explanation;Verbal cues;Handout   Comprehension Verbalized understanding;Returned demonstration          PT Short Term Goals - 07/11/16 1045      PT SHORT TERM GOAL #1   Title independent with initial HEP   Time 4   Period Weeks   Status New     PT SHORT TERM GOAL #2   Title report pain in  L hip/pelvis < 5/10 with ADLs for improved activity tolerance   Time 4   Status New     PT SHORT TERM GOAL #3   Title tolerate standing > 15 min without increase in pain for ability to perform cooking activities   Time 4   Period Weeks   Status New     PT SHORT TERM GOAL #4   Title Demo L knee strength 3+/5 for greater stability during gait and transfers    Time 4   Period Weeks   Status New           PT Long Term Goals - 07/11/16 1046      PT LONG TERM GOAL #1   Title independent with advanced HEP   Time 8   Period Weeks   Status New     PT LONG TERM GOAL #2   Title stand with equal weightbearing and no increase in pain    Time 8   Period Weeks   Status New     PT LONG TERM GOAL #3   Title amb > 15 min without cane with min increase in pain for improved activity tolerance   Time 8   Period Weeks   Status New     PT LONG TERM GOAL #4   Title improve LLE strength to at least 4/5 for improved strength and function   Time 8   Period Weeks   Status New     PT  LONG TERM GOAL #5   Title demonstrate 5 reps sit to/from stand without UE support for improved functional strength   Time 8   Period Weeks   Status New               Plan - 07/16/16 1152    Clinical Impression Statement Pain anterior hip less with manual.  decompression needed 2 pillows.  Patient BP/ 122/93.  She thought she was having a heart attack last night.     PT Next Visit Plan Decompression,  check to see if she is doing manual.  establish HEP for LLE strengthening, prone to centralize pain. work on functional activities to increase LLE use (sit to/from stand, bed mobility, etc-avoids LLE use due to pain), gait and balance   PT Home Exercise Plan prone on elbows, posture   Consulted and Agree with Plan of Care Patient      Patient will benefit from skilled therapeutic intervention in order to improve the following deficits and impairments:  Abnormal gait, Pain, Decreased strength, Decreased balance, Decreased mobility, Difficulty walking, Decreased range of motion, Decreased activity tolerance, Impaired UE functional use, Postural dysfunction, Impaired sensation, Impaired flexibility, Improper body mechanics, Increased fascial restricitons  Visit Diagnosis: Muscle weakness (generalized)  Other abnormalities of gait and mobility  Pain in left hip  Pain in thoracic spine  Pain in left leg     Problem List Patient Active Problem List   Diagnosis Date Noted  . Opioid dependence ([place]) 05/10/2016  . Uterine leiomyoma 02/16/2016  . Pain, dental 01/31/2016  . Chronic hip pain, left 01/20/2016  . Status post left hip replacement 01/20/2016  . Liver lesion 12/01/2015  . Bronchiectasis ([place]) 12/01/2015  . DDD (degenerative disc disease), thoracic 11/23/2015  . Situational depression 10/19/2015  . Dyspnea on exertion 08/22/2015  . HTN (hypertension) 07/08/2015  . Routine health maintenance 07/08/2015  . Tobacco abuse 07/08/2015    Jori Frerichs PTA 07/16/2016,  11:56 AM  [place]  [place], [place], [address] Phone: [phone]   Fax:  [phone]  Name: Jean [name] MRN: [id number] Date of Birth: [birth year]

## 2016-07-16 NOTE — Patient Instructions (Signed)

## 2016-07-17 ENCOUNTER — Ambulatory Visit (INDEPENDENT_AMBULATORY_CARE_PROVIDER_SITE_OTHER): Payer: No Typology Code available for payment source | Admitting: Orthopaedic Surgery

## 2016-07-18 ENCOUNTER — Encounter (INDEPENDENT_AMBULATORY_CARE_PROVIDER_SITE_OTHER): Payer: Self-pay | Admitting: Orthopaedic Surgery

## 2016-07-18 ENCOUNTER — Ambulatory Visit (INDEPENDENT_AMBULATORY_CARE_PROVIDER_SITE_OTHER): Payer: Self-pay

## 2016-07-18 ENCOUNTER — Ambulatory Visit: Payer: Self-pay | Admitting: Physical Therapy

## 2016-07-18 ENCOUNTER — Ambulatory Visit (INDEPENDENT_AMBULATORY_CARE_PROVIDER_SITE_OTHER): Payer: Self-pay | Admitting: Orthopaedic Surgery

## 2016-07-18 VITALS — BP 154/95 | HR 78 | Ht 63.0 in | Wt 137.0 lb

## 2016-07-18 DIAGNOSIS — M542 Cervicalgia: Secondary | ICD-10-CM

## 2016-07-18 DIAGNOSIS — R2689 Other abnormalities of gait and mobility: Secondary | ICD-10-CM

## 2016-07-18 DIAGNOSIS — M546 Pain in thoracic spine: Secondary | ICD-10-CM

## 2016-07-18 DIAGNOSIS — M79605 Pain in left leg: Secondary | ICD-10-CM

## 2016-07-18 DIAGNOSIS — M6281 Muscle weakness (generalized): Secondary | ICD-10-CM

## 2016-07-18 DIAGNOSIS — M25552 Pain in left hip: Secondary | ICD-10-CM

## 2016-07-18 NOTE — Progress Notes (Signed)
Office Visit Note   Patient: Jean [name]           Date of Birth: 1966-03-14           MRN: [id number] Visit Date: 07/18/2016              Requested by: Dellia Nims, MD [place], [place] [address] PCP: Dellia Nims, MD   Assessment & Plan: Visit Diagnoses:  1. Neck pain   2. Pain in thoracic spine     Plan: Patient just saw Dr. Micheline Chapman 6 days ago and is still in physical therapy that he had ordered. She will continue with the outlined treatment plan. At the end of the visit she requested not narcotic medication and I discussed with her with her history of opioid dependence and she should avoid narcotic medication. No evidence of radiculopathy or myelopathy on exam.  Follow-Up Instructions: Return if symptoms worsen or fail to improve.   Orders:  Orders Placed This Encounter  Procedures  . XR Cervical Spine 2 or 3 views   No orders of the defined types were placed in this encounter.     Procedures: No procedures performed   Clinical Data: No additional findings.   Subjective: Chief Complaint  Patient presents with  . Back Pain    Patient referred today for back pain. Reports having ongoing back pain for one year. No known injury.  States pain is constant and radiates into her left leg with periodic episodes of numbness and tingling in legs. She states better with heat and laying down in bed. Pain worse with ambulation.  She had a left total hip arthroplasty on 01/20/16 by Dr [name].  She has been going to physical therapy for a week.    Back Pain   Patient states she is having pain in the thoracic region states is worse on the right side. Says it radiates under her shoulder blade. She is a mature with a cane and has shakiness when she tries to get from sitting to standing walks with the tremor hasn't "walking on egg shell" gait. Patient states previous time she had to do some lifting not able do this currently. She was turned down for disability. She  has an attorney helping her.  Review of Systems  Musculoskeletal: Positive for back pain.     Objective: Vital Signs: BP (!) 154/95 (BP Location: Left Arm, Patient Position: Sitting)   Pulse 78   Ht 5\' 3"  (1.6 m)   Wt 137 lb (62.1 kg)   BMI 24.27 kg/m   Physical Exam  Constitutional: She is oriented to person, place, and time. She appears well-developed.  HENT:  Head: Normocephalic.  Right Ear: External ear normal.  Left Ear: External ear normal.  Eyes: Pupils are equal, round, and reactive to light.  Neck: No tracheal deviation present. No thyromegaly present.  Cardiovascular: Normal rate.   Pulmonary/Chest: Effort normal.  Abdominal: Soft.  Musculoskeletal:  Leg lengths equal. Patient has F with attempts at the checking ankle dorsiflexion plantar flexion strength. She withdraws with palpation over the scapula. She complains of pain with the reaching up overhead. She has a negative empty can test. Upper extremity and lower extremity reflexes are 2+ and symmetrical no isolated motor weakness. Sensory testing is normal no lower extremity clonus. Giving way weakness with resisted ankle plantar flexion dorsiflexion which is nonphysiologic and resolves with encouragement.  Neurological: She is alert and oriented to person, place, and time.  Skin: Skin  is warm and dry.  Psychiatric: She has a normal mood and affect. Her behavior is normal.    Ortho Exam patient is tremulous with the getting off the exam table with tips get from sitting to standing. She has repetitive flexion-extension at the hips and lumbosacral junction as she stands up. Symmetrical reflexes without clonus . Positive distraction test positive pinch test.  Specialty Comments:  No specialty comments available.  Imaging: Xr Cervical Spine 2 Or 3 Views  Result Date: 07/18/2016 The lateral cervical spine reviewed. This shows normal alignment normal disc space. No uncovertebral changes no soft tissue calcification.  Impression: Normal cervical spine    PMFS History: Patient Active Problem List   Diagnosis Date Noted  . Opioid dependence ([place]) 05/10/2016  . Uterine leiomyoma 02/16/2016  . Pain, dental 01/31/2016  . Chronic hip pain, left 01/20/2016  . Status post left hip replacement 01/20/2016  . Liver lesion 12/01/2015  . Bronchiectasis ([place]) 12/01/2015  . DDD (degenerative disc disease), thoracic 11/23/2015  . Situational depression 10/19/2015  . Dyspnea on exertion 08/22/2015  . HTN (hypertension) 07/08/2015  . Routine health maintenance 07/08/2015  . Tobacco abuse 07/08/2015   Past Medical History:  Diagnosis Date  . Arthritis   . Bronchitis    states at least 2-3 months ago  . Carpal tunnel syndrome    bilateral  . Depression   . GERD (gastroesophageal reflux disease)   . Headache    migraines  . Heart murmur   . Herpes   . Hypertension     Family History  Problem Relation Age of Onset  . Cancer Mother   . Hypertension Mother   . Diabetes Mother   . Stroke Father     Past Surgical History:  Procedure Laterality Date  . NO PAST SURGERIES    . SALPINGECTOMY    . TOTAL HIP ARTHROPLASTY Left 01/20/2016   Procedure: LEFT TOTAL HIP ARTHROPLASTY ANTERIOR APPROACH;  Surgeon: Mcarthur Rossetti, MD;  Location[facility];  Service: Orthopedics;  Laterality: Left;   Social History   Occupational History  . Not on file.   Social History Main Topics  . Smoking status: Current Some Day Smoker    Types: Cigarettes    Last attempt to quit: 04/06/2016  . Smokeless tobacco: Never Used     Comment:  ABOUT 1 CIGARETTES EVERY 1-2 DAYS  . Alcohol use No     Comment: stopped September 2017  . Drug use: No     Comment: states last used Sept 1 or 2  x 1  . Sexual activity: Yes    Partners: Male

## 2016-07-18 NOTE — Therapy (Signed)
[place], [place], [address] Phone: [phone]   Fax:  [phone]  Physical Therapy Treatment  Patient Details  Name: Jean [name] MRN: [id number] Date of Birth: 05-16-1965 Referring Provider: Dr. Lilia [name]   Encounter Date: 07/18/2016      PT End of Session - 07/18/16 0934    Visit Number 3   Number of Visits 16   Date for PT Re-Evaluation 09/05/16   PT Start Time 0803   PT Stop Time 0900   PT Time Calculation (min) 57 min   Activity Tolerance Patient tolerated treatment well   Behavior During Therapy [name] for tasks assessed/performed      Past Medical History:  Diagnosis Date  . Arthritis   . Bronchitis    states at least 2-3 months ago  . Carpal tunnel syndrome    bilateral  . Depression   . GERD (gastroesophageal reflux disease)   . Headache    migraines  . Heart murmur   . Herpes   . Hypertension     Past Surgical History:  Procedure Laterality Date  . NO PAST SURGERIES    . SALPINGECTOMY    . TOTAL HIP ARTHROPLASTY Left 01/20/2016   Procedure: LEFT TOTAL HIP ARTHROPLASTY ANTERIOR APPROACH;  Surgeon: Jean Rossetti, MD;  Location: [facility];  Service: Orthopedics;  Laterality: Left;    There were no vitals filed for this visit.      Subjective Assessment - 07/18/16 0806    Subjective Sees Dr. Lorin [name] today for the first time (back) . Out of pain meds and muscel relaxers.  The worst pain in Rt. scap today.    Currently in Pain? Yes   Pain Score 10-Worst pain ever   Pain Location Back   Pain Orientation Upper;Right   Pain Descriptors / Indicators Tightness;Sharp   Pain Type Chronic pain   Pain Onset More than a month ago   Pain Frequency Constant   Pain Score 3   Pain Location Hip   Pain Orientation Left   Pain Descriptors / Indicators Aching   Pain Type Chronic pain;Surgical pain   Pain Onset More than a month ago   Pain Frequency Constant   Pain Score 7    Pain Location Knee   Pain Orientation Left   Pain Descriptors / Indicators Aching   Pain Type Chronic pain   Pain Onset More than a month ago   Pain Frequency Intermittent                         [facility] Adult PT Treatment/Exercise - 07/18/16 0817      Lumbar Exercises: Stretches   Single Knee to Chest Stretch 2 reps;30 seconds     Knee/Hip Exercises: Supine   Quad Sets Strengthening;Both;1 set;10 reps   Short Arc Quad Sets Strengthening;Left;1 set;10 reps   Heel Slides Strengthening;Left;1 set;10 reps   Bridges Strengthening;Both;1 set;10 reps   Other Supine Knee/Hip Exercises ankle pump x 10   Other Supine Knee/Hip Exercises Glute set x 10      Shoulder Exercises: Supine   Protraction AAROM;Both;10 reps   Protraction Weight (lbs) cane    Flexion AAROM;Both;10 reps   Shoulder Flexion Weight (lbs) cane    Other Supine Exercises retraction x 10 and shrug x 10 in supine      Shoulder Exercises: Stretch   Cross Chest Stretch 3 reps;20 seconds   Cross Chest Stretch Limitations Rt. UE  for rhomboid     Moist Heat Therapy   Number Minutes Moist Heat 15 Minutes   Moist Heat Location Shoulder;Lumbar Spine     Electrical Stimulation   Electrical Stimulation Location Rt. scapula    Electrical Stimulation Action IFC   Electrical Stimulation Parameters to tol    Electrical Stimulation Goals Pain     Manual Therapy   Soft tissue mobilization Rt. rhomboid and periscapular mm                   PT Short Term Goals - 07/18/16 0931      PT SHORT TERM GOAL #1   Title independent with initial HEP   Status On-going     PT SHORT TERM GOAL #2   Title report pain in L hip/pelvis < 5/10 with ADLs for improved activity tolerance   Status On-going     PT SHORT TERM GOAL #3   Title tolerate standing > 15 min without increase in pain for ability to perform cooking activities   Status On-going     PT SHORT TERM GOAL #4   Title Demo L knee strength 3+/5 for  greater stability during gait and transfers    Status On-going           PT Long Term Goals - 07/18/16 0931      PT LONG TERM GOAL #1   Title independent with advanced HEP   Status On-going     PT LONG TERM GOAL #2   Title stand with equal weightbearing and no increase in pain    Status On-going     PT LONG TERM GOAL #3   Title amb > 15 min without cane with min increase in pain for improved activity tolerance   Status On-going     PT LONG TERM GOAL #4   Title improve LLE strength to at least 4/5 for improved strength and function   Status On-going     PT LONG TERM GOAL #5   Title demonstrate 5 reps sit to/from stand without UE support for improved functional strength   Status On-going               Plan - 07/18/16 0827    Clinical Impression Statement Pain inreased today especially in upper back, scapula.  She has not done her HHPT HEP in a long time.  We reviewed basic supine Level 1 hip ex and she was able to do with min cues and intermittent A.  Reduced pain in Rt. upper back with IFC and manual.  Walks out of clinic with greater ease.    PT Next Visit Plan Decompression?  What did MD say/do? establish HEP for LLE strengthening, prone to centralize pain. work on functional activities to increase LLE use (sit to/from stand, bed mobility, etc-avoids LLE use due to pain), gait and balance   PT Home Exercise Plan prone on elbows, posture, level 1 hip from HHPT, has a booklet: QS, GS, hip abd in supine, mini bridge   Consulted and Agree with Plan of Care Patient      Patient will benefit from skilled therapeutic intervention in order to improve the following deficits and impairments:  Abnormal gait, Pain, Decreased strength, Decreased balance, Decreased mobility, Difficulty walking, Decreased range of motion, Decreased activity tolerance, Impaired UE functional use, Postural dysfunction, Impaired sensation, Impaired flexibility, Improper body mechanics, Increased fascial  restricitons  Visit Diagnosis: Muscle weakness (generalized)  Other abnormalities of gait and mobility  Pain in left  hip  Pain in thoracic spine  Pain in left leg     Problem List Patient Active Problem List   Diagnosis Date Noted  . Opioid dependence ([place]) 05/10/2016  . Uterine leiomyoma 02/16/2016  . Pain, dental 01/31/2016  . Chronic hip pain, left 01/20/2016  . Status post left hip replacement 01/20/2016  . Liver lesion 12/01/2015  . Bronchiectasis ([place]) 12/01/2015  . DDD (degenerative disc disease), thoracic 11/23/2015  . Situational depression 10/19/2015  . Dyspnea on exertion 08/22/2015  . HTN (hypertension) 07/08/2015  . Routine health maintenance 07/08/2015  . Tobacco abuse 07/08/2015    Jean [name] 07/18/2016, 9:35 AM  [place], [place], [address] Phone: [phone]   Fax:  [phone]  Name: Jean [name] MRN: [id number] Date of Birth: 03-26-1966  Raeford Razor, PT 07/18/16 9:35 AM Phone: [phone] Fax: [phone]

## 2016-07-19 ENCOUNTER — Telehealth (INDEPENDENT_AMBULATORY_CARE_PROVIDER_SITE_OTHER): Payer: Self-pay | Admitting: Orthopaedic Surgery

## 2016-07-19 NOTE — Telephone Encounter (Signed)
No answer, lm for rtc 

## 2016-07-19 NOTE — Telephone Encounter (Signed)
PT CALLED  AND REQUESTS SOMETHING FOR PAIN.   PT HAD APPT WITH [facility] YESTERDAY  [phone]

## 2016-07-20 NOTE — Telephone Encounter (Signed)
I left voicemail for patient advising. 

## 2016-07-20 NOTE — Telephone Encounter (Signed)
Sorry. No narcotics, she can use OTC meds.

## 2016-07-20 NOTE — Telephone Encounter (Signed)
Please advise 

## 2016-07-20 NOTE — Telephone Encounter (Signed)
I think this is for Lorin Mercy since the pain med would be for her neck pain she saw him for yesterday

## 2016-07-23 ENCOUNTER — Ambulatory Visit: Payer: Self-pay | Admitting: Physical Therapy

## 2016-07-23 ENCOUNTER — Telehealth: Payer: Self-pay | Admitting: Physical Therapy

## 2016-07-23 NOTE — Telephone Encounter (Signed)
Called patient about missing appointment.  She had called at 8:30 to cancel due to not being able to get out of bed.  She plans to attend the next appointment 3/21 at 9:30 with Jenn PAA.  Melvenia Needles PTA

## 2016-07-25 ENCOUNTER — Ambulatory Visit: Payer: Self-pay | Admitting: Physical Therapy

## 2016-07-27 ENCOUNTER — Telehealth: Payer: Self-pay | Admitting: Internal Medicine

## 2016-07-27 NOTE — Telephone Encounter (Signed)
APT. REMINDER CALL, LMTCB °

## 2016-07-30 ENCOUNTER — Encounter: Payer: Self-pay | Admitting: Internal Medicine

## 2016-07-30 ENCOUNTER — Encounter: Payer: Self-pay | Admitting: Physical Therapy

## 2016-07-30 ENCOUNTER — Ambulatory Visit: Payer: Self-pay | Admitting: Physical Therapy

## 2016-07-30 ENCOUNTER — Ambulatory Visit (INDEPENDENT_AMBULATORY_CARE_PROVIDER_SITE_OTHER): Payer: No Typology Code available for payment source | Admitting: Internal Medicine

## 2016-07-30 VITALS — BP 127/67 | HR 68 | Temp 98.2°F | Ht 63.0 in | Wt 135.8 lb

## 2016-07-30 DIAGNOSIS — M546 Pain in thoracic spine: Secondary | ICD-10-CM

## 2016-07-30 DIAGNOSIS — R2689 Other abnormalities of gait and mobility: Secondary | ICD-10-CM

## 2016-07-30 DIAGNOSIS — G894 Chronic pain syndrome: Secondary | ICD-10-CM

## 2016-07-30 DIAGNOSIS — F4321 Adjustment disorder with depressed mood: Secondary | ICD-10-CM

## 2016-07-30 DIAGNOSIS — M79605 Pain in left leg: Secondary | ICD-10-CM

## 2016-07-30 DIAGNOSIS — D259 Leiomyoma of uterus, unspecified: Secondary | ICD-10-CM

## 2016-07-30 DIAGNOSIS — M25552 Pain in left hip: Secondary | ICD-10-CM

## 2016-07-30 DIAGNOSIS — Z01419 Encounter for gynecological examination (general) (routine) without abnormal findings: Secondary | ICD-10-CM

## 2016-07-30 DIAGNOSIS — M6281 Muscle weakness (generalized): Secondary | ICD-10-CM

## 2016-07-30 DIAGNOSIS — Z0000 Encounter for general adult medical examination without abnormal findings: Secondary | ICD-10-CM

## 2016-07-30 MED ORDER — SERTRALINE HCL 50 MG PO TABS: 50.0000 mg | ORAL_TABLET | Freq: Every day | ORAL | 2 refills | Status: DC

## 2016-07-30 NOTE — Therapy (Addendum)
[place], [place], [address] Phone: [phone]   Fax:  [phone]  Physical Therapy Treatment and Discharge   Patient Details  Name: Jean [name] MRN: [id number] Date of Birth: 1966/03/28 Referring Provider: Dr. Lilia Argue   Encounter Date: 07/30/2016      PT End of Session - 07/30/16 1520    Visit Number 4   Number of Visits 16   Date for PT Re-Evaluation 09/05/16   PT Start Time 1430   PT Stop Time 1515   PT Time Calculation (min) 45 min   Activity Tolerance Patient tolerated treatment well   Behavior During Therapy [facility] for tasks assessed/performed      Past Medical History:  Diagnosis Date  . Arthritis   . Bronchitis    states at least 2-3 months ago  . Carpal tunnel syndrome    bilateral  . Depression   . GERD (gastroesophageal reflux disease)   . Headache    migraines  . Herpes   . Hypertension     Past Surgical History:  Procedure Laterality Date  . NO PAST SURGERIES    . SALPINGECTOMY    . TOTAL HIP ARTHROPLASTY Left 01/20/2016   Procedure: LEFT TOTAL HIP ARTHROPLASTY ANTERIOR APPROACH;  Surgeon: Mcarthur Rossetti, MD;  Location: [facility];  Service: Orthopedics;  Laterality: Left;    There were no vitals filed for this visit.      Subjective Assessment - 07/30/16 1429    Subjective Saw Primary MD at [facility]. Dr Genene Churn.  She has arthritis and that is why she has low back pain.  She is getting referrals for neurologist,  Cardiology   (intermitant chest pain,  no today)   Currently in Pain? Yes   Pain Score 5    Pain Location Back   Pain Orientation Lower   Pain Type Chronic pain   Pain Radiating Towards goes to hip and thigh goes    Aggravating Factors  laying on left side.   Pain Relieving Factors laying on right side   Effect of Pain on Daily Activities hurts to walk ,, hard time sleeping   Pain Score 2   Pain Location Hip   Pain Orientation  Left   Pain Descriptors / Indicators Aching  Feeels like a needle stick,  it feels like it gets out of socket when turns to the right.  She has to move it back ion place with her hands.    Pain Type Chronic pain   Pain Radiating Towards toward left knee   Pain Frequency Constant   Aggravating Factors  laying on left side, walking , bending   Pain Relieving Factors sleeping,  meds,  heat ,  pillows, side right sleeping   Pain Score 0   Pain Location Knee   Pain Orientation Left   Pain Descriptors / Indicators Heaviness  weak,  it is hard to control,  it flops   Aggravating Factors  getting up from sitting   Pain Relieving Factors decreased weightbearing                         [facility] Adult PT Treatment/Exercise - 07/30/16 0001      Self-Care   Self-Care --  ADL.   posture, bed mobility.  turning feet vs pivot,  ex     Lumbar Exercises: Stretches   Pelvic Tilt 5 reps     Lumbar Exercises: Supine  Bent Knee Raise Limitations attempted. painful   Other Supine Lumbar Exercises ball squeeze with legs on large bolster   Other Supine Lumbar Exercises Decompression  5 minutes  head and shoulder press 5 X 5 seconds each     Knee/Hip Exercises: Supine   Quad Sets Strengthening;Both;1 set;10 reps   Quad Sets Limitations 2 sets, 1 set up high on tall bolster   Straight Leg Raises 5 reps;AAROM  Left painful so stopped   Other Supine Knee/Hip Exercises ankle pump x 10     Manual Therapy   Manual Therapy Edema management;Soft tissue mobilization   Edema Management thigh right   Soft tissue mobilization Rt. rhomboid and periscapular mm                 PT Education - 07/30/16 1528    Education provided Yes   Education Details self care,  ADL   Person(s) Educated Patient   Methods Explanation;Demonstration   Comprehension Verbalized understanding          PT Short Term Goals - 07/30/16 1525      PT SHORT TERM GOAL #1   Title independent with initial  HEP   Baseline not doing consistantly   Time 4   Period Weeks   Status On-going     PT SHORT TERM GOAL #2   Title report pain in L hip/pelvis < 5/10 with ADLs for improved activity tolerance   Baseline 8/10   Time 4   Period Weeks   Status On-going     PT SHORT TERM GOAL #3   Title tolerate standing > 15 min without increase in pain for ability to perform cooking activities   Time 4   Period Weeks   Status Unable to assess     PT SHORT TERM GOAL #4   Title Demo L knee strength 3+/5 for greater stability during gait and transfers    Time 4   Period Weeks   Status Unable to assess           PT Long Term Goals - 07/18/16 0931      PT LONG TERM GOAL #1   Title independent with advanced HEP   Status On-going     PT LONG TERM GOAL #2   Title stand with equal weightbearing and no increase in pain    Status On-going     PT LONG TERM GOAL #3   Title amb > 15 min without cane with min increase in pain for improved activity tolerance   Status On-going     PT LONG TERM GOAL #4   Title improve LLE strength to at least 4/5 for improved strength and function   Status On-going     PT LONG TERM GOAL #5   Title demonstrate 5 reps sit to/from stand without UE support for improved functional strength   Status On-going               Plan - 07/30/16 1522    Clinical Impression Statement Less pain with exercises.  Focus on self care. She stays in bed most of the time.  Encouraged more HEP.  She declined the need for modalities .  Increased control of left leg for sit to stand.    PT Next Visit Plan  Review HEP for LLE strengthening, prone to centralize pain. work on functional activities to increase LLE use (sit to/from stand, bed mobility, etc-avoids LLE use due to pain), gait and balance   PT Home Exercise  Plan prone on elbows, posture, level 1 hip from HHPT, has a booklet: QS, GS, hip abd in supine, mini bridge   Consulted and Agree with Plan of Care Patient       Patient will benefit from skilled therapeutic intervention in order to improve the following deficits and impairments:  Abnormal gait, Pain, Decreased strength, Decreased balance, Decreased mobility, Difficulty walking, Decreased range of motion, Decreased activity tolerance, Impaired UE functional use, Postural dysfunction, Impaired sensation, Impaired flexibility, Improper body mechanics, Increased fascial restricitons  Visit Diagnosis: Muscle weakness (generalized)  Other abnormalities of gait and mobility  Pain in left hip  Pain in thoracic spine  Pain in left leg     Problem List Patient Active Problem List   Diagnosis Date Noted  . Chronic pain syndrome 05/10/2016  . Uterine leiomyoma 02/16/2016  . Liver lesion 12/01/2015  . Situational depression 10/19/2015  . HTN (hypertension) 07/08/2015  . Routine health maintenance 07/08/2015  . Tobacco abuse 07/08/2015    HARRIS,KAREN PTA 07/30/2016, 3:28 PM  [facility] [address] [place], [place], [address] Phone: [phone]   Fax:  [phone]  Name: Jean [name] MRN: [id number] Date of Birth: Jul 23, 1965  PHYSICAL THERAPY DISCHARGE SUMMARY  Visits from Start of Care: 4  Current functional level related to goals / functional outcomes: See above for most recent info    Remaining deficits: Pain, ROM, strength, mobility in general    Education / Equipment: HEP, posture , RICE   Plan: Patient agrees to discharge.  Patient goals were not met. Patient is being discharged due to not returning since the last visit.  ?????    Raeford Razor, PT 10/17/16 2:07 PM Phone: [phone] Fax: [phone]

## 2016-07-30 NOTE — Assessment & Plan Note (Signed)
Has hx of uterine fibroids. Transvaginal u/s 01/2016: Fibroid uterus. Many of these fibroids appear to be very pedunculated along the right side of the uterus. She continues to have pain from this. Would like gyn referral. Has tenderness over right and left pelvic areas on exam. - gyn referral.

## 2016-07-30 NOTE — Patient Instructions (Signed)

## 2016-07-30 NOTE — Patient Instructions (Signed)
Performed pap smear.  For your uterine fibroids, we will refer you to gynecology.

## 2016-07-30 NOTE — Progress Notes (Signed)
   CC: pap smear and pelvic pain  HPI:  JeanJean [name] is a 51 y.o. with pmh as listed below is here for pap smear and also complaining of pelvic pain  No pap report in chart, had pap long time ago which was normal per patient. No bleeding between her menstruations, no discharge, no dysuria.   Has hx of uterine fibroids. Transvaginal u/s 01/2016: Fibroid uterus. Many of these fibroids appear to be very pedunculated along the right side of the uterus. She continues to have pain from this. Would like gyn referral.  Continues to have chronic back, hip, and leg pain.    Past Medical History:  Diagnosis Date  . Arthritis   . Bronchitis    states at least 2-3 months ago  . Carpal tunnel syndrome    bilateral  . Depression   . GERD (gastroesophageal reflux disease)   . Headache    migraines  . Heart murmur   . Herpes   . Hypertension     Review of Systems:   Review of Systems  Constitutional: Negative for chills and fever.  Cardiovascular: Negative for chest pain.  Gastrointestinal: Negative for blood in stool, constipation, heartburn, nausea and vomiting.  Genitourinary: Negative for dysuria, flank pain, hematuria and urgency.  Musculoskeletal: Positive for back pain and joint pain. Negative for myalgias and neck pain.  Skin: Negative for rash.     Physical Exam:  Vitals:   07/30/16 1323  BP: 127/67  Pulse: 68  Temp: 98.2 F (36.8 C)  TempSrc: Oral  SpO2: 100%  Weight: 135 lb 12.8 oz (61.6 kg)  Height: 5\' 3"  (1.6 m)   Physical Exam  Constitutional: She appears well-developed and well-nourished.  HENT:  Head: Normocephalic and atraumatic.  Cardiovascular: Normal rate and regular rhythm.  Exam reveals no gallop and no friction rub.   No murmur heard. Respiratory: Effort normal and breath sounds normal. No respiratory distress. She has no wheezes.  Genitourinary: There is no rash, tenderness, lesion or injury on the right labia. There is no rash, tenderness,  lesion or injury on the left labia. No erythema, tenderness or bleeding in the vagina. No foreign body in the vagina. No signs of injury around the vagina. No vaginal discharge found.  Genitourinary Comments: Obtained pap sample. No discharge, bleeding, or mass. Has tenderness over the adnexal areas bilaterally on palpation.   Lymphadenopathy:       Right: No inguinal adenopathy present.       Left: No inguinal adenopathy present.    Assessment & Plan:   See Encounters Tab for problem based charting.  Patient discussed with Dr. Lynnae January

## 2016-07-30 NOTE — Assessment & Plan Note (Signed)
On zoloft 50mg  daily. Has not been taking it due to not having it. Asking me to send in refill which I did.

## 2016-07-31 NOTE — Progress Notes (Signed)
Internal Medicine Clinic Attending  Case discussed with Dr. Ahmed at the time of the visit.  We reviewed the resident's history and exam and pertinent patient test results.  I agree with the assessment, diagnosis, and plan of care documented in the resident's note. 

## 2016-08-01 LAB — CYTOLOGY - PAP
Diagnosis: NEGATIVE
HPV: NOT DETECTED

## 2016-08-02 ENCOUNTER — Telehealth: Payer: Self-pay | Admitting: *Deleted

## 2016-08-02 NOTE — Telephone Encounter (Signed)
Called pt - someone answered the telephone, stated she's not at home.

## 2016-08-02 NOTE — Telephone Encounter (Signed)
-----   Message from Dellia Nims, MD sent at 08/02/2016  9:51 AM EDT ----- Called patient to let her know that her pap smear result was normal. Went to voicemail. Please relay this message when she calls back.

## 2016-08-06 ENCOUNTER — Encounter: Payer: Self-pay | Admitting: Obstetrics and Gynecology

## 2016-08-06 ENCOUNTER — Ambulatory Visit: Payer: Self-pay | Attending: Sports Medicine | Admitting: Physical Therapy

## 2016-08-08 ENCOUNTER — Telehealth: Payer: Self-pay | Admitting: Physical Therapy

## 2016-08-08 ENCOUNTER — Ambulatory Visit: Payer: Self-pay | Admitting: Physical Therapy

## 2016-08-08 NOTE — Telephone Encounter (Signed)
Called patient regarding her missed appts this week.  Asked patient to call us back.  She has no more appts scheduled after today.

## 2016-08-16 ENCOUNTER — Telehealth: Payer: Self-pay

## 2016-08-16 NOTE — Telephone Encounter (Signed)
Patient was contacted by Florinda Marker, PharmD candidate. Patient unavailable at this time.

## 2016-08-20 ENCOUNTER — Ambulatory Visit (INDEPENDENT_AMBULATORY_CARE_PROVIDER_SITE_OTHER): Payer: No Typology Code available for payment source | Admitting: Internal Medicine

## 2016-08-20 ENCOUNTER — Encounter: Payer: Self-pay | Admitting: Internal Medicine

## 2016-08-20 VITALS — BP 148/84 | HR 72 | Temp 98.2°F | Ht 63.0 in | Wt 133.9 lb

## 2016-08-20 DIAGNOSIS — F17210 Nicotine dependence, cigarettes, uncomplicated: Secondary | ICD-10-CM

## 2016-08-20 DIAGNOSIS — Z79899 Other long term (current) drug therapy: Secondary | ICD-10-CM

## 2016-08-20 DIAGNOSIS — M545 Low back pain: Secondary | ICD-10-CM

## 2016-08-20 DIAGNOSIS — Z96642 Presence of left artificial hip joint: Secondary | ICD-10-CM

## 2016-08-20 DIAGNOSIS — G894 Chronic pain syndrome: Secondary | ICD-10-CM

## 2016-08-20 DIAGNOSIS — M25552 Pain in left hip: Secondary | ICD-10-CM

## 2016-08-20 DIAGNOSIS — I10 Essential (primary) hypertension: Secondary | ICD-10-CM

## 2016-08-20 DIAGNOSIS — M546 Pain in thoracic spine: Secondary | ICD-10-CM

## 2016-08-20 MED ORDER — GABAPENTIN 100 MG PO CAPS: 100.0000 mg | ORAL_CAPSULE | Freq: Three times a day (TID) | ORAL | 0 refills | Status: DC

## 2016-08-20 NOTE — Patient Instructions (Signed)
General Instructions: - Start Gabapentin 100 mg three times daily - Can increase ibuprofen to three times daily if needed - Follow up with sports medicine - Continue home exercises   Please try to bring all your medicines next time. This will help [place] keep you safe from mistakes.   Progress Toward Treatment Goals:  No flowsheet data found.  Self Care Goals & Plans:  Self Care Goal 07/30/2016  Manage my medications bring my medications to every visit; refill my medications on time; take my medicines as prescribed  Monitor my health -  Eat healthy foods drink diet soda or water instead of juice or soda; eat more vegetables; eat foods that are low in salt; eat baked foods instead of fried foods; eat fruit for snacks and desserts  Be physically active find an activity I enjoy  Stop smoking go to the [employer] (https://scott-booker.info/); call [employer] (1-800-QUIT-NOW)  Meeting treatment goals maintain the current self-care plan    No flowsheet data found.   Care Management & Community Referrals:  No flowsheet data found.

## 2016-08-20 NOTE — Progress Notes (Signed)
   CC: Hypertension follow up  HPI:  [name] is a 51 y.o. woman with past medical history as noted below who presents today for follow-up of her hypertension.  HTN: BP today is elevated at 148/84. Patient is taking Amlodipine 5 mg daily and HCTZ 25 mg daily.   Chronic Left Hip and Back Pain: Patient has a longstanding hx of left hip pain and low back pain. She had a left hip replacement in Sept 2017. She reports increased pain in these areas. She describes the pain radiating from her lower back down both legs, but primarily down the left leg. Pain is sharp and intermittent. She has been taking ibuprofen 800 mg twice daily and flexeril 5 mg twice daily which provide relief. She has not tried the gabapentin that was prescribed to her as she was not aware of this medication. She is also describing an upper back pain that is located under both shoulder blades. This pain started about 1 year ago. She describes the pain as sharp, worse on the left side, and the pain will radiate to the front of her chest. She feels the pain is elicited more when she is lying down. She reports getting an x-ray last year that showed "a growth." Review of her imaging shows a thoracic spine x-ray with an endplate spurring at L2-7. She denies any weakness, loss of bladder/bowel control, or inability to ambulate. She currently uses a cane to ambulate. She has difficulty going up stairs.    Past Medical History:  Diagnosis Date  . Arthritis   . Bronchitis    states at least 2-3 months ago  . Carpal tunnel syndrome    bilateral  . Depression   . GERD (gastroesophageal reflux disease)   . Headache    migraines  . Herpes   . Hypertension     Review of Systems:   General: Denies fever, chills, night sweats, changes in weight, changes in appetite HEENT: Denies headaches, ear pain, changes in vision, rhinorrhea, sore throat CV: Denies CP, palpitations, SOB, orthopnea Pulm: Denies SOB, cough, wheezing GI:  Denies abdominal pain, nausea, vomiting, diarrhea, constipation, melena, hematochezia GU: Denies dysuria, hematuria, frequency Msk: See HPI Neuro: Denies numbness, tingling Skin: Denies rashes, bruising Psych: Denies depression, anxiety, hallucinations  Physical Exam:  Vitals:   08/20/16 1327  BP: (!) 148/84  Pulse: 72  Temp: 98.2 F (36.8 C)  TempSrc: Oral  SpO2: 100%  Weight: 133 lb 14.4 oz (60.7 kg)  Height: 5\' 3"  (1.6 m)   General: Well-nourished woman in NAD CV: RRR, no m/g/r Msk: Mild tenderness to palpation of lumbar spinous processes and surrounding muscles. Strength 5/5 in upper and lower extremities. Mild pain is elicited in the left subscapular region when patient raises arms to 120 degrees. Tenderness to palpation of left more than right subscapular area.   Assessment & Plan:   See Encounters Tab for problem based charting.  Patient discussed with Dr. Lynnae January

## 2016-08-21 ENCOUNTER — Encounter: Payer: Self-pay | Admitting: Internal Medicine

## 2016-08-21 NOTE — Assessment & Plan Note (Signed)
BP is mildly elevated today, but this is likely due to her acute on chronic pain in her upper/lower back and left hip. Will have her continue amlodipine 5 mg daily and HCTZ 25 mg daily. Will continue to monitor closely and adjust her medications once her pain is better controlled.

## 2016-08-21 NOTE — Assessment & Plan Note (Signed)
She reports increased pain in her left hip and lower back which has been present for a few years. She is not having any warning symptoms or changes in activity from baseline. Advised her to start gabapentin 100 mg 3 times daily and to increase ibuprofen 800 mg from twice daily to 3 times daily. She will continue Flexeril 5 mg as needed. We reviewed her thoracic lumbar spine x-ray together and this is likely the cause for her upper back pain. We discussed trying physical therapy, but patient states she has already worked with PT on exercises and is doing these at home. She may need a steroid injection in the future if pain cannot be controlled with Gabapentin and Ibuprofen.

## 2016-08-21 NOTE — Progress Notes (Signed)
Internal Medicine Clinic Attending  Case discussed with Dr. Rivet at the time of the visit.  We reviewed the resident's history and exam and pertinent patient test results.  I agree with the assessment, diagnosis, and plan of care documented in the resident's note.  

## 2016-08-27 ENCOUNTER — Ambulatory Visit (INDEPENDENT_AMBULATORY_CARE_PROVIDER_SITE_OTHER): Payer: Self-pay | Admitting: Clinical

## 2016-08-27 ENCOUNTER — Encounter: Payer: Self-pay | Admitting: Obstetrics and Gynecology

## 2016-08-27 ENCOUNTER — Ambulatory Visit (INDEPENDENT_AMBULATORY_CARE_PROVIDER_SITE_OTHER): Payer: Self-pay | Admitting: Sports Medicine

## 2016-08-27 ENCOUNTER — Encounter: Payer: Self-pay | Admitting: Sports Medicine

## 2016-08-27 ENCOUNTER — Other Ambulatory Visit (HOSPITAL_COMMUNITY)
Admission: RE | Admit: 2016-08-27 | Discharge: 2016-08-27 | Disposition: A | Discharge status: Home / Self Care | Payer: No Typology Code available for payment source | Source: Ambulatory Visit | Attending: Obstetrics and Gynecology | Admitting: Obstetrics and Gynecology

## 2016-08-27 ENCOUNTER — Ambulatory Visit (INDEPENDENT_AMBULATORY_CARE_PROVIDER_SITE_OTHER): Payer: Self-pay | Admitting: Obstetrics and Gynecology

## 2016-08-27 VITALS — BP 124/54 | Ht 63.0 in | Wt 135.0 lb

## 2016-08-27 VITALS — BP 126/75 | HR 71 | Wt 137.5 lb

## 2016-08-27 DIAGNOSIS — Z3202 Encounter for pregnancy test, result negative: Secondary | ICD-10-CM

## 2016-08-27 DIAGNOSIS — F4323 Adjustment disorder with mixed anxiety and depressed mood: Secondary | ICD-10-CM

## 2016-08-27 DIAGNOSIS — Z1389 Encounter for screening for other disorder: Secondary | ICD-10-CM

## 2016-08-27 DIAGNOSIS — Z1331 Encounter for screening for depression: Secondary | ICD-10-CM

## 2016-08-27 DIAGNOSIS — N920 Excessive and frequent menstruation with regular cycle: Secondary | ICD-10-CM | POA: Insufficient documentation

## 2016-08-27 DIAGNOSIS — D259 Leiomyoma of uterus, unspecified: Secondary | ICD-10-CM

## 2016-08-27 DIAGNOSIS — M25552 Pain in left hip: Secondary | ICD-10-CM

## 2016-08-27 LAB — POCT PREGNANCY, URINE: Preg Test, Ur: NEGATIVE

## 2016-08-27 NOTE — Progress Notes (Signed)
   Subjective:    Patient ID: Jean [name], female    DOB: 1965/05/26, 51 y.o.   MRN: [id number]  HPI chief complaint: Mid back pain and left hip pain  Patient comes in today with a couple of different complaints. She is complaining of diffuse mid back pain which will radiate into her chest. This is a chronic problem for her. She has a documented history of T8-T9 degenerative disc disease. She tried physical therapy but it made her symptoms worse. She denies any recent trauma. No numbness or tingling into her arms. She is also complaining of left hip pain. She is status [name] left hip total hip arthroplasty. Since surgery she has had mechanical symptoms in the left hip. Specifically, she describes popping and catching as well as pain in the groin. She is ambulating with a cane. She denies low back pain at the current time. No numbness or tingling in her legs.  Interim medical history reviewed Medications reviewed Allergies reviewed    Review of Systems    as above Objective:   Physical Exam  Well-developed, well-nourished. No acute distress  Thoracic spine: Diffuse tenderness to palpation but nothing focal. No tenderness to palpation or percussion along the thoracic or lumbar midline. No spasm.  Left hip: Diffuse tenderness to palpation but nothing focal. Smooth painless hip range of motion with a negative logroll. Global weakness secondary to pain. Neurovascularly intact distally. Ambulating with a cane.  X-rays of her thoracic spine and left hip from earlier this year are reviewed. Degenerative changes at T8-T9 are noted with a rather large osteophyte. Nothing acute. X-rays of her left hip are unremarkable. There is no evidence of hardware loosening or fracture.      Assessment & Plan:   Diffuse thoracic pain with x-ray evidence of degenerative disc disease Left hip pain status [name] total hip arthroplasty in 2017  I explained to the patient that there is no good treatment for  her thoracic degenerative disc disease. There is an element of pain that she will have to live with unfortunately. I did offer her an IM cortisone injection today but she would like to wait on that for now. For her left hip I would like to get further imaging. We will get a CT scan specifically to rule out any sort of loose body which may explain her mechanical symptoms. Phone follow-up after that CT scan to discuss those results and delineate further workup and treatment.

## 2016-08-27 NOTE — Patient Instructions (Signed)
[place] in Radiology 1st Floor at [facility] 08/30/16 at 530p [address] Phone: [phone]

## 2016-08-27 NOTE — BH Specialist Note (Signed)
Integrated Behavioral Health Initial Visit  MRN: [id number] Name: ATZIRY BARANSKI   Session Start time: 3:30 Session End time: 3:50 Total time: 20 minutes  Type of Service: [place] Interpretor:No. Interpretor Name and Language: n/a   Warm Hand Off Completed.       SUBJECTIVE: RAND BOLLER is a 51 y.o. female accompanied by patient. Patient was referred by Dr Rip Harbour for depression, anxiety. Patient reports the following symptoms/concerns: Pt states that she has been in pain for one year, increasing feelings of depression in over one month, BH meds are not helping; feeling anxious about unknown health condition. Duration of problem: One year; Severity of problem: severe  OBJECTIVE: Mood: Anxious and Depressed and Affect: Tearful Risk of harm to self or others: No plan to harm self or others   LIFE CONTEXT: Family and Social: - School/Work: - Self-Care: - Life Changes: Chronic pain for one year  GOALS ADDRESSED: Patient will reduce symptoms of: anxiety and depression and increase knowledge and/or ability of: self-management skills and also: Increase healthy adjustment to current life circumstances   INTERVENTIONS: Motivational Interviewing and Psychoeducation and/or Health Education  Standardized Assessments completed: GAD-7 and PHQ 9  ASSESSMENT: Patient currently experiencing Adjustment disorder with anxious and depressed mood. Patient may benefit from psychoeducation and brief therapeutic interventions regarding coping with symptoms of anxiety and depression, along with referral to psychiatry for [facility] med management.  PLAN: 1. Follow up with behavioral health clinician on : Two weeks (one week via phone f/u) 2. Behavioral recommendations:  -Consider calming apps to distract from pain and cope with feelings, as needed -Read educational material regarding coping with symptoms of anxiety and depression 3. Referral(s): [facility] (In Clinic) 4. "From scale of 1-10, how likely are you to follow plan?": 7  Garlan Fair, LCSWA   Depression screen [facility] 2/9 08/27/2016 08/20/2016 07/30/2016 05/14/2016 04/23/2016  Decreased Interest 3 0 3 0 0  Down, Depressed, Hopeless 3 0 3 0 3  PHQ - 2 Score 6 0 6 0 3  Altered sleeping 1 0 3 - 3  Tired, decreased energy 3 0 3 - 3  Change in appetite 1 0 0 - 0  Feeling bad or failure about yourself  3 0 0 - 0  Trouble concentrating 2 0 0 - 0  Moving slowly or fidgety/restless 1 1 3  - 1  Suicidal thoughts 0 0 0 - 0  PHQ-9 Score 17 1 15  - 10  Difficult doing work/chores - Extremely dIfficult Extremely dIfficult - Somewhat difficult  Some recent data might be hidden   GAD 7 : Generalized Anxiety Score 08/27/2016  Nervous, Anxious, on Edge 3  Worry too much - different things 1  Trouble relaxing 1  Restless 3  Easily annoyed or irritable 3  Afraid - awful might happen 1

## 2016-08-27 NOTE — Progress Notes (Signed)
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  Waiting to hear back from the [place].  [facility] information [facility] given to pt to follow up.

## 2016-08-27 NOTE — Patient Instructions (Signed)
Vaginal Hysterectomy A vaginal hysterectomy is a procedure to remove all or part of the uterus through a small incision in the vagina. In this procedure, your health care provider may remove your entire uterus, including the lower end (cervix). You may need a vaginal hysterectomy to treat:  Uterine fibroids.  A condition that causes the lining of the uterus to grow in other areas (endometriosis).  Problems with pelvic support.  Cancer of the cervix, ovaries, uterus, or tissue that lines the uterus (endometrium).  Excessive (dysfunctional) uterine bleeding. When removing your uterus, your health care provider may also remove the organs that produce eggs (ovaries) and the tubes that carry eggs to your uterus (fallopian tubes). After a vaginal hysterectomy, you will no longer be able to have a baby. You will also no longer get your menstrual period. Tell a health care provider about:  Any allergies you have.  All medicines you are taking, including vitamins, herbs, eye drops, creams, and over-the-counter medicines.  Any problems you or family members have had with anesthetic medicines.  Any blood disorders you have.  Any surgeries you have had.  Any medical conditions you have.  Whether you are pregnant or may be pregnant. What are the risks? Generally, this is a safe procedure. However, problems may occur, including:  Bleeding.  Infection.  A blood clot that forms in your leg and travels to your lungs (pulmonary embolism).  Damage to surrounding organs.  Pain during sex. What happens before the procedure?  Ask your health care provider what organs will be removed during surgery.  Ask your health care provider about:  Changing or stopping your regular medicines. This is especially important if you are taking diabetes medicines or blood thinners.  Taking medicines such as aspirin and ibuprofen. These medicines can thin your blood. Do not take these medicines before your  procedure if your health care provider instructs you not to.  Follow instructions from your health care provider about eating or drinking restrictions.  Do not use any tobacco products, such as cigarettes, chewing tobacco, and e-cigarettes. If you need help quitting, ask your health care provider.  Plan to have someone take you home after discharge from the hospital. What happens during the procedure?  To reduce your risk of infection:  Your health care team will wash or sanitize their hands.  Your skin will be washed with soap.  An IV tube will be inserted into one of your veins.  You may be given antibiotic medicine to help prevent infection.  You will be given one or more of the following:  A medicine to help you relax (sedative).  A medicine to numb the area (local anesthetic).  A medicine to make you fall asleep (general anesthetic).  A medicine that is injected into an area of your body to numb everything beyond the injection site (regional anesthetic).  Your surgeon will make an incision in your vagina.  Your surgeon will locate and remove all or part of your uterus.  Your ovaries and fallopian tubes may be removed at the same time.  The incision will be closed with stitches (sutures) that dissolve over time. The procedure may vary among health care providers and hospitals. What happens after the procedure?  Your blood pressure, heart rate, breathing rate, and blood oxygen level will be monitored often until the medicines you were given have worn off.  You will be encouraged to get up and walk around after a few hours to help prevent complications.  You may have IV tubes in place for a few days.  You will be given pain medicine as needed.  Do not drive for 24 hours if you were given a sedative. This information is not intended to replace advice given to you by your health care provider. Make sure you discuss any questions you have with your health care  provider. Document Released: 08/15/2015 Document Revised: 09/29/2015 Document Reviewed: 05/08/2015 Elsevier Interactive Patient Education  2017 Elsevier Inc. Uterine Fibroids Uterine fibroids are tissue masses (tumors). They are also called leiomyomas. They can develop inside of a woman's womb (uterus). They can grow very large. Fibroids are not cancerous (benign). Most fibroids do not require medical treatment. Follow these instructions at home:  Keep all follow-up visits as told by your doctor. This is important.  Take medicines only as told by your doctor.  If you were prescribed a hormone treatment, take the hormone medicines exactly as told.  Do not take aspirin. It can cause bleeding.  Ask your doctor about taking iron pills and increasing the amount of dark green, leafy vegetables in your diet. These actions can help to boost your blood iron levels.  Pay close attention to your period. Tell your doctor about any changes, such as:  Increased blood flow. This may require you to use more pads or tampons than usual per month.  A change in the number of days that your period lasts per month.  A change in symptoms that come with your period, such as back pain or cramping in your belly area (abdomen). Contact a doctor if:  You have pain in your back or the area between your hip bones (pelvic area) that is not controlled by medicines.  You have pain in your abdomen that is not controlled with medicines.  You have an increase in bleeding between and during periods.  You soak tampons or pads in a half hour or less.  You feel lightheaded.  You feel extra tired.  You feel weak. Get help right away if:  You pass out (faint).  You have a sudden increase in pelvic pain. This information is not intended to replace advice given to you by your health care provider. Make sure you discuss any questions you have with your health care provider. Document Released: 05/26/2010 Document  Revised: 12/23/2015 Document Reviewed: 10/20/2013 Elsevier Interactive Patient Education  2017 [employer].

## 2016-08-30 ENCOUNTER — Ambulatory Visit (HOSPITAL_COMMUNITY)
Admission: RE | Admit: 2016-08-30 | Discharge: 2016-08-30 | Disposition: A | Discharge status: Home / Self Care | Payer: Self-pay | Source: Ambulatory Visit | Attending: Sports Medicine | Admitting: Sports Medicine

## 2016-08-30 DIAGNOSIS — Z96642 Presence of left artificial hip joint: Secondary | ICD-10-CM | POA: Insufficient documentation

## 2016-08-30 DIAGNOSIS — M25552 Pain in left hip: Secondary | ICD-10-CM | POA: Insufficient documentation

## 2016-09-03 ENCOUNTER — Telehealth: Payer: Self-pay | Admitting: Clinical

## 2016-09-03 NOTE — Telephone Encounter (Signed)
Integrated Behavioral Health Medication Management Phone Note  MRN: [id number] NAME: Jean [name]  Time Call Initiated: 10:23 Time Call Completed: 10:28 Total Call Time: 5 minutes  Current Medications:  Outpatient Medications Prior to Visit  Medication Sig Dispense Refill  . albuterol (PROVENTIL HFA;VENTOLIN HFA) 108 (90 Base) MCG/ACT inhaler Inhale 1-2 puffs into the lungs every 6 (six) hours as needed for wheezing or shortness of breath. 1 Inhaler 2  . amLODipine (NORVASC) 5 MG tablet Take 1 tablet (5 mg total) by mouth daily. 30 tablet 11  . aspirin EC 325 MG EC tablet Take 1 tablet (325 mg total) by mouth daily. 30 tablet 0  . cyclobenzaprine (FLEXERIL) 10 MG tablet Take 0.5 tablets (5 mg total) by mouth 3 (three) times daily as needed for muscle spasms. 60 tablet 0  . gabapentin (NEURONTIN) 100 MG capsule Take 1 capsule (100 mg total) by mouth 3 (three) times daily. 90 capsule 0  . hydrochlorothiazide (HYDRODIURIL) 25 MG tablet Take 1 tablet (25 mg total) by mouth daily. 90 tablet 0  . HYDROcodone-acetaminophen (NORCO/VICODIN) 5-325 MG tablet   0  . ibuprofen (ADVIL,MOTRIN) 800 MG tablet Take 1 tablet (800 mg total) by mouth every 8 (eight) hours as needed. 30 tablet 0  . sertraline (ZOLOFT) 50 MG tablet Take 1 tablet (50 mg total) by mouth daily. 30 tablet 2  . traMADol (ULTRAM) 50 MG tablet TAKE 1 TABLET BY MOUTH EVERY 8 TO 12 HOURS AS NEEDED FOR PAIN  0   No facility-administered medications prior to visit.     Patient has been able to get all medications filled as prescribed: Yes  Patient is currently taking all medications as prescribed: Yes  Patient reports experiencing side effects: Yes, increase in crying  Patient describes feeling this way on medications: Pt has felt like she is crying more than usual  Additional patient concerns: none  Patient advised to schedule appointment with provider for evaluation of medication side effects or additional concerns: Yes- Pt  says she call back after her next medical appointment with internal medicine, to make an appointment with [place].    Caroleen Hamman Theophilus Walz, LCSWA

## 2016-09-04 ENCOUNTER — Telehealth: Payer: Self-pay

## 2016-09-04 NOTE — Telephone Encounter (Signed)
Called patient discuss her bleeding she was having. Patient is not soaking more than 1-2 pads per hour. I have advised patient to continued to monitored her cycle and if she should start to bleed heavy and start having pain that will not go away she should call us back. We are waiting on surgical pathology report to come back and will come call her with that information.

## 2016-09-04 NOTE — Telephone Encounter (Signed)
Patient called and left message. She would like to discuss some bleeding she is having.

## 2016-09-05 ENCOUNTER — Telehealth: Payer: Self-pay | Admitting: General Practice

## 2016-09-05 NOTE — Progress Notes (Signed)
Patient ID: Jean [name], female   DOB: 19-Sep-1965, 51 y.o.   MRN: [id number] [name] presents with c/o heavy vaginal bleeding. H/O IDA in the past as well. U/S Sept 17 several small fibroids ranging in sized from 2-4 cm.  PE AF VSS Lungs clear Heart RRR Abd soft + BS GU nl EGBUS cervix no lesions uterus 10 weeks mobile no masses   EMBX  ENDOMETRIAL BIOPSY     The indications for endometrial biopsy were reviewed.   Risks of the biopsy including cramping, bleeding, infection, uterine perforation, inadequate specimen and need for additional procedures  were discussed. The patient states she understands and agrees to undergo procedure today. Consent was signed. Time out was performed. Urine HCG was negative. During the pelvic exam, the cervix was prepped with Betadine. A single-toothed tenaculum was placed on the anterior lip of the cervix to stabilize it. The 3 mm pipelle was introduced into the endometrial cavity without difficulty to a depth of 9cm, and a moderate amount of tissue was obtained and sent to pathology. The instruments were removed from the patient's vagina. Minimal bleeding from the cervix was noted. The patient tolerated the procedure well. Routine post-procedure instructions were given to the patient.    A/P Heavy cycles         Uterine Fibroids  EMBX completed today. Will return to discuss results and treatment. Pt considering TVH. Information provided.

## 2016-09-05 NOTE — Telephone Encounter (Signed)
Per Dr Rip Harbour, Please have pt make appt to discuss [facility]  [facility] results were negative. Called patient and her fiance answered stating she wasn't with him at this time but he would tell her we called.

## 2016-09-07 ENCOUNTER — Telehealth: Payer: Self-pay | Admitting: Sports Medicine

## 2016-09-07 NOTE — Telephone Encounter (Signed)
Patient to be notified by my CMA, April Manson, that the CT scan of her left hip is normal. No evidence of loose body. Her total hip replacement looks good. I've recommend continued follow-up with Dr. [name] if she continues to have symptoms.

## 2016-09-10 ENCOUNTER — Encounter: Payer: Self-pay | Admitting: Internal Medicine

## 2016-09-10 ENCOUNTER — Ambulatory Visit: Payer: No Typology Code available for payment source | Admitting: Internal Medicine

## 2016-09-10 NOTE — Progress Notes (Signed)
Entered in error. Please disregard.   ROS Physical Exam

## 2016-09-10 NOTE — Telephone Encounter (Signed)
Patient notified and agreed to f/u with Dr [name]

## 2016-09-11 NOTE — Telephone Encounter (Signed)
Patient has been informed of test results and appointment has been scheduled 09/21/2016

## 2016-09-12 ENCOUNTER — Other Ambulatory Visit: Payer: Self-pay

## 2016-09-12 DIAGNOSIS — M5134 Other intervertebral disc degeneration, thoracic region: Secondary | ICD-10-CM

## 2016-09-12 DIAGNOSIS — G894 Chronic pain syndrome: Secondary | ICD-10-CM

## 2016-09-12 NOTE — Telephone Encounter (Signed)
cyclobenzaprine (FLEXERIL) 10 MG tablet, refill request @[facility] . Also please call pt back.

## 2016-09-13 MED ORDER — CYCLOBENZAPRINE HCL 10 MG PO TABS: 5.0000 mg | ORAL_TABLET | Freq: Three times a day (TID) | ORAL | 0 refills | Status: DC | PRN

## 2016-09-21 ENCOUNTER — Telehealth: Payer: Self-pay | Admitting: *Deleted

## 2016-09-21 ENCOUNTER — Ambulatory Visit: Payer: Self-pay | Admitting: Obstetrics and Gynecology

## 2016-09-21 ENCOUNTER — Encounter: Payer: Self-pay | Admitting: *Deleted

## 2016-09-21 NOTE — Telephone Encounter (Signed)
Orla missed her scheduled appointment to discuss results/ TVH. I called and left a message with a female and asked him to tell her she missed an appointment and please call our office to reschedule. Will also send letter.

## 2016-09-24 ENCOUNTER — Other Ambulatory Visit: Payer: Self-pay

## 2016-09-24 DIAGNOSIS — M5134 Other intervertebral disc degeneration, thoracic region: Secondary | ICD-10-CM

## 2016-09-24 DIAGNOSIS — G894 Chronic pain syndrome: Secondary | ICD-10-CM

## 2016-09-24 MED ORDER — IBUPROFEN 800 MG PO TABS: 800.0000 mg | ORAL_TABLET | Freq: Three times a day (TID) | ORAL | 0 refills | Status: DC | PRN

## 2016-09-24 MED ORDER — CYCLOBENZAPRINE HCL 10 MG PO TABS: 5.0000 mg | ORAL_TABLET | Freq: Three times a day (TID) | ORAL | 0 refills | Status: DC | PRN

## 2016-09-24 NOTE — Telephone Encounter (Signed)
Called pt to see if she's out of Flexeril - no answer.

## 2016-09-24 NOTE — Telephone Encounter (Signed)
ibuprofen (ADVIL,MOTRIN) 800 MG tablet,  cyclobenzaprine (FLEXERIL) 10 MG tablet, refill request @ [facility].

## 2016-10-06 NOTE — Addendum Note (Signed)
Addended by: Dellia Nims on: 10/06/2016 07:44 PM   Modules accepted: Orders

## 2016-10-08 ENCOUNTER — Ambulatory Visit (INDEPENDENT_AMBULATORY_CARE_PROVIDER_SITE_OTHER): Payer: Self-pay | Admitting: Obstetrics and Gynecology

## 2016-10-08 ENCOUNTER — Encounter: Payer: Self-pay | Admitting: Obstetrics and Gynecology

## 2016-10-08 VITALS — BP 175/98 | HR 55 | Wt 131.1 lb

## 2016-10-08 DIAGNOSIS — N920 Excessive and frequent menstruation with regular cycle: Secondary | ICD-10-CM

## 2016-10-08 DIAGNOSIS — D259 Leiomyoma of uterus, unspecified: Secondary | ICD-10-CM

## 2016-10-08 NOTE — Progress Notes (Signed)
Pt refuses to see [facility].   Pt here for follow up from her EMBX completed in April. EMBX was negative. BX was performed secondary to heavy cycles. Pt continues with heavy cycles. GYN U/S of Sept revealed multiple uterine fibroids, with at least 2 pedunculated.   Pt desires definite therapy.   TSVD x 1, EAB x 1, Left ectopic pregnancy  PE  AF  VSS Lungs clear Heart RRR Abd soft + BS GU Nl EGBUS uterus 10 weeks size, mobile, slightly tender, no adnexal masses or tenderness  A/P Heavy Cycles        Uterine fibroids  TVH/BS recommended to pt. R/B and post op care reviewed with pt. Will schedule. F/U with post op appt.

## 2016-10-08 NOTE — Patient Instructions (Signed)
Vaginal Hysterectomy A vaginal hysterectomy is a procedure to remove all or part of the uterus through a small incision in the vagina. In this procedure, your health care provider may remove your entire uterus, including the lower end (cervix). You may need a vaginal hysterectomy to treat:  Uterine fibroids.  A condition that causes the lining of the uterus to grow in other areas (endometriosis).  Problems with pelvic support.  Cancer of the cervix, ovaries, uterus, or tissue that lines the uterus (endometrium).  Excessive (dysfunctional) uterine bleeding.  When removing your uterus, your health care provider may also remove the organs that produce eggs (ovaries) and the tubes that carry eggs to your uterus (fallopian tubes). After a vaginal hysterectomy, you will no longer be able to have a baby. You will also no longer get your menstrual period. Tell a health care provider about:  Any allergies you have.  All medicines you are taking, including vitamins, herbs, eye drops, creams, and over-the-counter medicines.  Any problems you or family members have had with anesthetic medicines.  Any blood disorders you have.  Any surgeries you have had.  Any medical conditions you have.  Whether you are pregnant or may be pregnant. What are the risks? Generally, this is a safe procedure. However, problems may occur, including:  Bleeding.  Infection.  A blood clot that forms in your leg and travels to your lungs (pulmonary embolism).  Damage to surrounding organs.  Pain during sex.  What happens before the procedure?  Ask your health care provider what organs will be removed during surgery.  Ask your health care provider about: ? Changing or stopping your regular medicines. This is especially important if you are taking diabetes medicines or blood thinners. ? Taking medicines such as aspirin and ibuprofen. These medicines can thin your blood. Do not take these medicines before  your procedure if your health care provider instructs you not to.  Follow instructions from your health care provider about eating or drinking restrictions.  Do not use any tobacco products, such as cigarettes, chewing tobacco, and e-cigarettes. If you need help quitting, ask your health care provider.  Plan to have someone take you home after discharge from the hospital. What happens during the procedure?  To reduce your risk of infection: ? Your health care team will wash or sanitize their hands. ? Your skin will be washed with soap.  An IV tube will be inserted into one of your veins.  You may be given antibiotic medicine to help prevent infection.  You will be given one or more of the following: ? A medicine to help you relax (sedative). ? A medicine to numb the area (local anesthetic). ? A medicine to make you fall asleep (general anesthetic). ? A medicine that is injected into an area of your body to numb everything beyond the injection site (regional anesthetic).  Your surgeon will make an incision in your vagina.  Your surgeon will locate and remove all or part of your uterus.  Your ovaries and fallopian tubes may be removed at the same time.  The incision will be closed with stitches (sutures) that dissolve over time. The procedure may vary among health care providers and hospitals. What happens after the procedure?  Your blood pressure, heart rate, breathing rate, and blood oxygen level will be monitored often until the medicines you were given have worn off.  You will be encouraged to get up and walk around after a few hours to help prevent   complications.  You may have IV tubes in place for a few days.  You will be given pain medicine as needed.  Do not drive for 24 hours if you were given a sedative. This information is not intended to replace advice given to you by your health care provider. Make sure you discuss any questions you have with your health care  provider. Document Released: 08/15/2015 Document Revised: 09/29/2015 Document Reviewed: 05/08/2015 Elsevier Interactive Patient Education  2018 Elsevier Inc.  

## 2016-10-11 ENCOUNTER — Encounter: Payer: Self-pay | Admitting: *Deleted

## 2016-10-12 NOTE — Addendum Note (Signed)
Addended by: Dellia Nims on: 10/12/2016 10:51 AM   Modules accepted: Orders

## 2016-10-15 ENCOUNTER — Ambulatory Visit (INDEPENDENT_AMBULATORY_CARE_PROVIDER_SITE_OTHER): Payer: Self-pay | Admitting: Internal Medicine

## 2016-10-15 ENCOUNTER — Emergency Department (HOSPITAL_COMMUNITY): Payer: Self-pay

## 2016-10-15 ENCOUNTER — Encounter (HOSPITAL_COMMUNITY): Payer: Self-pay

## 2016-10-15 ENCOUNTER — Encounter (INDEPENDENT_AMBULATORY_CARE_PROVIDER_SITE_OTHER): Payer: Self-pay | Admitting: Physician Assistant

## 2016-10-15 ENCOUNTER — Other Ambulatory Visit: Payer: Self-pay

## 2016-10-15 ENCOUNTER — Encounter (INDEPENDENT_AMBULATORY_CARE_PROVIDER_SITE_OTHER): Payer: Self-pay

## 2016-10-15 ENCOUNTER — Emergency Department (HOSPITAL_COMMUNITY)
Admission: EM | Admit: 2016-10-15 | Discharge: 2016-10-15 | Disposition: A | Discharge status: Home / Self Care | Payer: Self-pay | Attending: Emergency Medicine | Admitting: Emergency Medicine

## 2016-10-15 ENCOUNTER — Ambulatory Visit (INDEPENDENT_AMBULATORY_CARE_PROVIDER_SITE_OTHER): Payer: Self-pay | Admitting: Physician Assistant

## 2016-10-15 VITALS — BP 150/92 | HR 70 | Temp 98.2°F | Wt 136.0 lb

## 2016-10-15 DIAGNOSIS — Z7982 Long term (current) use of aspirin: Secondary | ICD-10-CM | POA: Insufficient documentation

## 2016-10-15 DIAGNOSIS — R0789 Other chest pain: Secondary | ICD-10-CM

## 2016-10-15 DIAGNOSIS — I10 Essential (primary) hypertension: Secondary | ICD-10-CM

## 2016-10-15 DIAGNOSIS — F17210 Nicotine dependence, cigarettes, uncomplicated: Secondary | ICD-10-CM

## 2016-10-15 DIAGNOSIS — R7989 Other specified abnormal findings of blood chemistry: Secondary | ICD-10-CM

## 2016-10-15 DIAGNOSIS — Z96642 Presence of left artificial hip joint: Secondary | ICD-10-CM | POA: Insufficient documentation

## 2016-10-15 DIAGNOSIS — Z79899 Other long term (current) drug therapy: Secondary | ICD-10-CM

## 2016-10-15 DIAGNOSIS — M25552 Pain in left hip: Secondary | ICD-10-CM

## 2016-10-15 HISTORY — DX: Other chest pain: R07.89

## 2016-10-15 LAB — BASIC METABOLIC PANEL
ANION GAP: 9 (ref 5–15)
BUN: 14 mg/dL (ref 6–20)
CALCIUM: 9.7 mg/dL (ref 8.9–10.3)
CO2: 26 mmol/L (ref 22–32)
CREATININE: 1.23 mg/dL — AB (ref 0.44–1.00)
Chloride: 103 mmol/L (ref 101–111)
GFR calc Af Amer: 57 mL/min — ABNORMAL LOW (ref >60)
GFR, EST NON AFRICAN AMERICAN: 49 mL/min — AB (ref >60)
GLUCOSE: 81 mg/dL (ref 65–99)
Potassium: 4.3 mmol/L (ref 3.5–5.1)
Sodium: 138 mmol/L (ref 135–145)

## 2016-10-15 LAB — CBC
HCT: 40.3 % (ref 36.0–46.0)
HEMOGLOBIN: 13.2 g/dL (ref 12.0–15.0)
MCH: 32.3 pg (ref 26.0–34.0)
MCHC: 32.8 g/dL (ref 30.0–36.0)
MCV: 98.5 fL (ref 78.0–100.0)
PLATELETS: 411 10*3/uL — AB (ref 150–400)
RBC: 4.09 MIL/uL (ref 3.87–5.11)
RDW: 13.5 % (ref 11.5–15.5)
WBC: 7.1 10*3/uL (ref 4.0–10.5)

## 2016-10-15 LAB — I-STAT TROPONIN, ED: TROPONIN I, POC: 0 ng/mL (ref 0.00–0.08)

## 2016-10-15 LAB — D-DIMER, QUANTITATIVE: D-Dimer, Quant: 0.56 ug/mL-FEU — ABNORMAL HIGH (ref 0.00–0.50)

## 2016-10-15 MED ORDER — AMLODIPINE BESYLATE 10 MG PO TABS: 10.0000 mg | ORAL_TABLET | Freq: Every day | ORAL | 2 refills | Status: DC

## 2016-10-15 MED ORDER — IOPAMIDOL (ISOVUE-370) INJECTION 76%
INTRAVENOUS | Status: AC
  Administered 2016-10-15: 100 mL via INTRAVENOUS
  Filled 2016-10-15: qty 100

## 2016-10-15 NOTE — ED Notes (Signed)
Patient transported to CT 

## 2016-10-15 NOTE — ED Triage Notes (Signed)
Pt states she has had sharp pains in her chest through to her back X2 days. She was at her PCP office today and sent here for further eval. Pt reports shortness of breath and dizziness as well. Skin warm and dry. No acute distress noted.

## 2016-10-15 NOTE — Assessment & Plan Note (Signed)
Having chest sharp chest pain over left chest, has tenderness to palpation over the chest wall. No hx of CAD. Low risk for PE given she is satting 100% on room air, no signs of DVT on exam, no tachycardia, no SOB.   Will have to use her flexeril to help with muscle spasm (has back pain chronically which may cause this radicular pain), also asked to use ibuprofen and heat/ice.

## 2016-10-15 NOTE — Assessment & Plan Note (Signed)
Vitals:   10/15/16 0819  BP: (!) 150/92  Pulse: 70   on hctz 25 mg daily + amlodipine 5 mg mg daily. Takes it daily. BP is slightly high today.  Will increase amlodipine to 10mg  daily. F/up in 8 weeks.

## 2016-10-15 NOTE — Discharge Instructions (Signed)
Please see your family doctor in the next week for recheck.  Your kidney function was mildly elevated today - please see your doctor for recheck.

## 2016-10-15 NOTE — ED Provider Notes (Signed)
[facility] DEPT Provider Note   CSN: [id number] Arrival date & time: 10/15/16  1103     History   Chief Complaint Chief Complaint  Patient presents with  . Back Pain  . Chest Pain    HPI Jean [name] is a 51 y.o. female.  The history is provided by the patient and medical records. No language interpreter was used.  Back Pain   Associated symptoms include chest pain.  Chest Pain   Associated symptoms include back pain and shortness of breath.   Jean [name] is a 51 y.o. female  with a PMH of HTN who presents to the Emergency Department complaining of Left-sided, sharp chest pain 2 days which is worse when she moves the left arm or coughs. Pain also worse with palpation. She believes pain is actually stemming from her back, noting that she has to grip spurs to this area which often causes her discomfort. When pain becomes very bad, she notes some shortness of breath with it. She took a muscle relaxer which improved her symptoms. She saw her primary care provider today who noted that her blood pressure was elevated and increased her amlodipine from 5 mg daily to 10 mg daily. Chest pain was thought to be musculoskeletal in etiology, possibly radiating from her chronic back pain/muscle spasm. At that appointment, per chart review, she was tender over the chest wall. No history of CAD and low risk for PE. No tachycardia at office visit. She then saw her orthopedist for scheduled outpatient appointment. She informed them of her chest pain and was told to report to the Emergency Department immediately. No leg swelling, long travel, recent surgeries/immobilizations. No calf pain. No fever, chills, cough, congestion.   Past Medical History:  Diagnosis Date  . Arthritis   . Bronchitis    states at least 2-3 months ago  . Carpal tunnel syndrome    bilateral  . Depression   . GERD (gastroesophageal reflux disease)   . Headache    migraines  . Herpes   . Hypertension   .  Uterine leiomyoma 02/16/2016    Patient Active Problem List   Diagnosis Date Noted  . Chest wall pain 10/15/2016  . Menorrhagia 08/27/2016  . Chronic pain syndrome 05/10/2016  . Uterine leiomyoma 02/16/2016  . Liver lesion 12/01/2015  . Situational depression 10/19/2015  . HTN (hypertension) 07/08/2015  . Routine health maintenance 07/08/2015  . Tobacco abuse 07/08/2015    Past Surgical History:  Procedure Laterality Date  . NO PAST SURGERIES    . SALPINGECTOMY    . TOTAL HIP ARTHROPLASTY Left 01/20/2016   Procedure: LEFT TOTAL HIP ARTHROPLASTY ANTERIOR APPROACH;  Surgeon: Mcarthur Rossetti, MD;  Location: [facility];  Service: Orthopedics;  Laterality: Left;    OB History    No data available       Home Medications    Prior to Admission medications   Medication Sig Start Date End Date Taking? Authorizing Provider  albuterol (PROVENTIL HFA;VENTOLIN HFA) 108 (90 Base) MCG/ACT inhaler Inhale 1-2 puffs into the lungs every 6 (six) hours as needed for wheezing or shortness of breath. 06/29/16   Dellia Nims, MD  amLODipine (NORVASC) 10 MG tablet Take 1 tablet (10 mg total) by mouth daily. 10/15/16 10/15/17  Dellia Nims, MD  aspirin EC 325 MG EC tablet Take 1 tablet (325 mg total) by mouth daily. 01/23/16   Mcarthur Rossetti, MD  cyclobenzaprine (FLEXERIL) 10 MG tablet Take 0.5 tablets (5 mg total) by  mouth 3 (three) times daily as needed for muscle spasms. 09/24/16   Dellia Nims, MD  gabapentin (NEURONTIN) 100 MG capsule Take 1 capsule (100 mg total) by mouth 3 (three) times daily. Patient not taking: Reported on 10/08/2016 08/20/16   Rivet, Sindy Guadel[place], MD  hydrochlorothiazide (HYDRODIURIL) 25 MG tablet Take 1 tablet (25 mg total) by mouth daily. 06/29/16   Dellia Nims, MD  ibuprofen (ADVIL,MOTRIN) 800 MG tablet Take 1 tablet (800 mg total) by mouth every 8 (eight) hours as needed. 09/24/16   Dellia Nims, MD  sertraline (ZOLOFT) 50 MG tablet Take 1 tablet (50 mg total) by  mouth daily. 07/30/16 07/30/17  Dellia Nims, MD    Family History Family History  Problem Relation Age of Onset  . Cancer Mother   . Hypertension Mother   . Diabetes Mother   . Stroke Father     Social History Social History  Substance Use Topics  . Smoking status: Current Some Day Smoker    Types: Cigarettes    Last attempt to quit: 04/06/2016  . Smokeless tobacco: Never Used     Comment:  ABOUT 3 CIGARETTES EVERY Day.  CUTTING BACK  . Alcohol use No     Comment: stopped September 2017     Allergies   Patient has no known allergies.   Review of Systems Review of Systems  Respiratory: Positive for shortness of breath.   Cardiovascular: Positive for chest pain.  Musculoskeletal: Positive for back pain.  All other systems reviewed and are negative.    Physical Exam Updated Vital Signs BP (!) 150/96 (BP Location: Right Arm)   Pulse 70   Temp 98.9 F (37.2 C) (Oral)   Resp 20   Ht 5\' 3"  (1.6 m)   Wt 61.7 kg (136 lb)   LMP 10/03/2016 (Exact Date)   SpO2 99%   BMI 24.09 kg/m   Physical Exam  Constitutional: She is oriented to person, place, and time. She appears well-developed and well-nourished. No distress.  HENT:  Head: Normocephalic and atraumatic.  Cardiovascular: Normal rate, regular rhythm, normal heart sounds and intact distal pulses.   No murmur heard. Pulmonary/Chest: Effort normal and breath sounds normal. No respiratory distress. She has no wheezes. She has no rales. She exhibits tenderness.  Abdominal: Soft. She exhibits no distension. There is no tenderness.  Musculoskeletal:       Arms: Chest and back pain reproducible with palpation and movement of the left arm. No midline tenderness.  Neurological: She is alert and oriented to person, place, and time.  Skin: Skin is warm and dry.  Nursing note and vitals reviewed.    ED Treatments / Results  Labs (all labs ordered are listed, but only abnormal results are displayed) Labs Reviewed    BASIC METABOLIC PANEL - Abnormal; Notable for the following:       Result Value   Creatinine, Ser 1.23 (*)    GFR calc non Af Amer 49 (*)    GFR calc Af Amer 57 (*)    All other components within normal limits  CBC - Abnormal; Notable for the following:    Platelets 411 (*)    All other components within normal limits  D-DIMER, QUANTITATIVE (NOT AT [facility]) - Abnormal; Notable for the following:    D-Dimer, Quant 0.56 (*)    All other components within normal limits  Randolm Idol, ED    EKG  EKG Interpretation  Date/Time:  Monday October 15 2016 11:36:56 EDT Ventricular Rate:  61 PR Interval:  158 QRS Duration: 94 QT Interval:  392 QTC Calculation: 394 R Axis:   -18 Text Interpretation:  Normal sinus rhythm Minimal voltage criteria for LVH, may be normal variant Septal infarct , age undetermined Abnormal ECG No significant change since last tracing Confirmed by Dorie Rank [phone]) on 10/15/2016 1:10:33 PM       Radiology Dg Chest 2 View  Result Date: 10/15/2016 CLINICAL DATA:  Chest pain EXAM: CHEST  2 VIEW COMPARISON:  Chest radiograph October 12, 2015 and chest CT November 30, 2015 FINDINGS: There is no edema or consolidation. Heart size and pulmonary vascularity are normal. No adenopathy. There is degenerative change in the mid to lower thoracic spine. No pneumothorax. IMPRESSION: No edema or consolidation. Electronically Signed   By: Lowella Grip III M.D.   On: 10/15/2016 12:14    Procedures Procedures (including critical care time)  Medications Ordered in ED Medications - No data to display   Initial Impression / Assessment and Plan / ED Course  I have reviewed the triage vital signs and the nursing notes.  Pertinent labs & imaging results that were available during my care of the patient were reviewed by me and considered in my medical decision making (see chart for details).    Jean [name] is a 51 y.o. female who presents to ED for chest pain and shortness of  breath x 2 days. She was seen by PCP for same earlier today. Per chart review A&P: "Having chest sharp chest pain over left chest, has tenderness to palpation over the chest wall. No hx of CAD. Low risk for PE given she is satting 100% on room air, no signs of DVT on exam, no tachycardia, no SOB. Will have to use her flexeril to help with muscle spasm (has back pain chronically which may cause this radicular pain), also asked to use ibuprofen and heat/ice." Patient subsequently went to her scheduled outpatient orthopedic appointment where she reported chest pain and shortness of breath. She was told to immediately go to the emergency department. On exam, patient is afebrile, hemodynamically stable with no tachycardia. Oxygenation between 99 and 100% on room air. Chest pain is very much reproducible on left chest wall and left upper back. Pain also is reproducible with movement of the left shoulder. EKG reassuring. Chest x-ray negative. Lab work including cbc, bmp, troponin and d-dimer ordered and resulted prior to my evaluation. Troponin negative, however d-dimer elevated at 0.56. Will obtain CT angio to rule out PE as etiology. If negative, likely discharge to home with symptomatic home care instruction. CT angio pending at shift change. Care assumed by oncoming provider, Dr. Ralene Bathe who will follow up on angio results and dispo appropriately.   Patient discussed with Dr. Tyrone Nine who agrees with treatment plan.   Final Clinical Impressions(s) / ED Diagnoses   Final diagnoses:  Elevated d-dimer    New Prescriptions New Prescriptions   No medications on file     Ward, Ozella Almond, PA-C 10/15/16 [place], [place], DO 10/16/16 1016

## 2016-10-15 NOTE — Progress Notes (Signed)
Jean [name] comes in today and she is having is shortness of breath and "fluttering" of her heart. Therefore I told her she should do straight from here to the emergency room. We did not charge her from office visit today. She was seen by her primary care physician this morning and is offered a EKG which she deferred. No charge for today's office visit. She'll follow-up with [place] on an as-needed basis

## 2016-10-15 NOTE — Progress Notes (Signed)
   CC: HTN f/up   HPI:  Jean [name] is a 51 y.o. with PMh as listed below is here for HTN f/up   Having some chest discomfort, sharp, movement of the left arm worsened the pain, felt like coming from the back, lasted throughout the day and even now. Coughing also hurts the chest. No change with exertion. Took muscle relaxer helped improved the pain.  HTN - on hctz 25 mg daily + amlodipine 5 mg mg daily. Takes it daily. BP is slightly high today.   Past Medical History:  Diagnosis Date  . Arthritis   . Bronchitis    states at least 2-3 months ago  . Carpal tunnel syndrome    bilateral  . Depression   . GERD (gastroesophageal reflux disease)   . Headache    migraines  . Herpes   . Hypertension   . Uterine leiomyoma 02/16/2016    Review of Systems:   Review of Systems  Constitutional: Negative for chills and fever.  Respiratory: Negative for cough, shortness of breath and wheezing.   Cardiovascular: Negative for chest pain, palpitations and leg swelling.  Gastrointestinal: Negative for heartburn and nausea.  Musculoskeletal: Positive for back pain and joint pain. Negative for myalgias.  Neurological: Negative for dizziness and headaches.     Physical Exam:  Vitals:   10/15/16 0819  BP: (!) 150/92  Pulse: 70  SpO2: 100%  Weight: 136 lb (61.7 kg)   Physical Exam  Constitutional: She is oriented to person, place, and time. She appears well-developed and well-nourished. No distress.  HENT:  Head: Normocephalic and atraumatic.  Respiratory: Effort normal and breath sounds normal. No respiratory distress. She has no wheezes. She has no rales.  Has tenderness over the left chest wall, no erythema, rash, or warmth.   Musculoskeletal:  No swelling noted, legs equal in size. No erythema or tenderness.   Neurological: She is alert and oriented to person, place, and time.  Skin: She is not diaphoretic.  Psychiatric: She has a normal mood and affect.     Assessment & Plan:   See Encounters Tab for problem based charting.  Patient discussed with Dr. Lynnae January

## 2016-10-15 NOTE — Patient Instructions (Signed)
Please increase amlodipine to 10 mg daily.  Follow up in 8 weeks for BP recheck.  If your chest pain becomes worse or you have other symptoms with it please call us immediately or go to the emergency room.

## 2016-10-16 NOTE — Progress Notes (Signed)
Internal Medicine Clinic Attending  Case discussed with Dr. Ahmed at the time of the visit.  We reviewed the resident's history and exam and pertinent patient test results.  I agree with the assessment, diagnosis, and plan of care documented in the resident's note. 

## 2016-10-17 ENCOUNTER — Encounter (HOSPITAL_COMMUNITY): Payer: Self-pay

## 2016-10-22 ENCOUNTER — Other Ambulatory Visit: Payer: Self-pay | Admitting: Internal Medicine

## 2016-10-24 ENCOUNTER — Encounter: Payer: Self-pay | Admitting: Internal Medicine

## 2016-10-24 ENCOUNTER — Ambulatory Visit: Payer: Self-pay

## 2016-10-25 ENCOUNTER — Other Ambulatory Visit: Payer: Self-pay | Admitting: *Deleted

## 2016-10-25 DIAGNOSIS — R0609 Other forms of dyspnea: Principal | ICD-10-CM

## 2016-10-25 DIAGNOSIS — I10 Essential (primary) hypertension: Secondary | ICD-10-CM

## 2016-10-28 MED ORDER — ALBUTEROL SULFATE HFA 108 (90 BASE) MCG/ACT IN AERS: 1.0000 | INHALATION_SPRAY | Freq: Four times a day (QID) | RESPIRATORY_TRACT | 2 refills | Status: DC | PRN

## 2016-10-28 MED ORDER — HYDROCHLOROTHIAZIDE 25 MG PO TABS: 25.0000 mg | ORAL_TABLET | Freq: Every day | ORAL | 2 refills | Status: DC

## 2016-10-29 ENCOUNTER — Ambulatory Visit (INDEPENDENT_AMBULATORY_CARE_PROVIDER_SITE_OTHER): Payer: Self-pay | Admitting: Internal Medicine

## 2016-10-29 VITALS — BP 153/87 | HR 63 | Temp 97.6°F | Wt 136.0 lb

## 2016-10-29 DIAGNOSIS — R0789 Other chest pain: Secondary | ICD-10-CM

## 2016-10-29 DIAGNOSIS — I10 Essential (primary) hypertension: Secondary | ICD-10-CM

## 2016-10-29 DIAGNOSIS — N1831 Chronic kidney disease, stage 3a: Secondary | ICD-10-CM | POA: Insufficient documentation

## 2016-10-29 DIAGNOSIS — N182 Chronic kidney disease, stage 2 (mild): Secondary | ICD-10-CM

## 2016-10-29 DIAGNOSIS — I129 Hypertensive chronic kidney disease with stage 1 through stage 4 chronic kidney disease, or unspecified chronic kidney disease: Secondary | ICD-10-CM

## 2016-10-29 DIAGNOSIS — N189 Chronic kidney disease, unspecified: Secondary | ICD-10-CM

## 2016-10-29 MED ORDER — DICLOFENAC SODIUM 1 % TD GEL: 2.0000 g | Freq: Four times a day (QID) | TRANSDERMAL | 0 refills | Status: DC

## 2016-10-29 NOTE — Progress Notes (Signed)
   CC: For follow-up of her blood pressure and chest pain.  HPI:  Jean [name] is a 51 y.o. with past medical history as listed below came to the clinic for follow-up of her blood pressure and chest pain. She was complaining of left-sided chest pain which increased with deep breathing and any body movements and elevated blood pressure during previous office visit on 10/10/2016. Later that day she was seen at her orthopedic's surgery office for her left hip pain, they advised her to immediately go to ED. During that ED visit ACS was ruled out with no change in EKG and negative troponin, she was having mildly elevated d-dimer resulting in a CTA which was negative for any pulmonary embolism.  During previous office visit her amlodipine was increased to 10 mg daily. Today she just took amlodipine in the morning, never took her HCTZ before coming to the clinic. She was complaining of intermittent lightheadedness, mostly when in the morning when she gets up from her bed. Dizziness was never bad enough to cause a fall.  She was also worried that she was told in ED that her renal functions are bad.  When I discussed with her regarding switching of her antihypertensives to ACE inhibitor, she told me that she was given a different blood pressure medication in the past which resulted in her lip and face swelling, patient do not remember the name. It was not listed under her allergies either, most likely lisinopril.  Past Medical History:  Diagnosis Date  . Arthritis   . Bronchitis    states at least 2-3 months ago  . Carpal tunnel syndrome    bilateral  . Depression   . GERD (gastroesophageal reflux disease)   . Headache    migraines  . Herpes   . Hypertension   . Uterine leiomyoma 02/16/2016    Review of Systems:  As per HPI.  Physical Exam:  Vitals:   10/29/16 0940  BP: (!) 153/87  Pulse: 63  Temp: 97.6 F (36.4 C)  TempSrc: Oral  SpO2: 100%  Weight: 136 lb (61.7 kg)    Orthostatic VS for the past 24 hrs:  BP- Lying Pulse- Lying BP- Sitting Pulse- Sitting BP- Standing at 0 minutes Pulse- Standing at 0 minutes  10/29/16 1039 167/90 70 155/77 62 (!) 136/92 58    General: Vital signs reviewed.  Patient is well-developed and well-nourished,Appears little dry, in no acute distress and cooperative with exam.  Cardiovascular: RRR, S1 normal, S2 normal, no murmurs, gallops, or rubs. Pulmonary/Chest: Left-sided chest wall tenderness, Clear to auscultation bilaterally, no wheezes, rales, or rhonchi. Abdominal: Soft, non-tender, non-distended, BS +, no masses, organomegaly, or guarding present.  Extremities: No lower extremity edema bilaterally,  pulses symmetric and intact bilaterally. No cyanosis or clubbing. Skin: Warm, dry and intact. No rashes or erythema. Psychiatric: Normal mood and affect. speech and behavior is normal. Cognition and memory are normal.  Assessment & Plan:   See Encounters Tab for problem based charting.  Patient discussed with Dr. Daryll Drown.

## 2016-10-29 NOTE — Patient Instructions (Addendum)
Thank you for visiting clinic today. We will check your kidney functions and urine today. Please continue to take your blood pressure medications as directed. Please take your both blood pressure medications in the morning especially before coming to the clinic, that will give [place] better idea about your blood pressure control. Your blood pressure was high today, I am not making any changes as you never took one of your blood pressure medication this morning. Please keep yourself well hydrated and drink plenty of fluids, your blood pressure decreased and you stand up from sitting or lying position, that can be due to dehydration. Please take it easy when you are changing her position and take a few moments to readjust before you start walking to prevent any fall. If your symptoms get worse and you continue to get dizzy each time you stand up from sitting, please come to the clinic for reevaluation. Please do not take ibuprofen, you can try Tylenol and Voltaren gel for your chest pain. Please follow-up in 4 weeks.

## 2016-10-29 NOTE — Assessment & Plan Note (Addendum)
She was also worried that she was told in ED that her renal functions are bad. She was having creatinine of 1.23 during ED visit, her creatinine remained between 1.15-1.23 over past one year. She was also given a contrast for CTA during this ED visit on 10/15/2009  When I discussed with her regarding switching of her antihypertensives to ACE inhibitor because of its renal protective effect, she told me that she was given a different blood pressure medication in the past which resulted in her lip and face swelling, patient do not remember the name. It was not listed under her allergies either, most likely lisinopril.  -Her UA done 1 year ago was negative for any protein urea.  -UA -Repeat BMP. -She was encouraged to increase fluid intake. -She was told to not take ibuprofen, instead she should be using Tylenol and Voltaren gel for pain.  Addendum. Her creatinine improved to 1.15, UA was negative for any protein urea.

## 2016-10-29 NOTE — Assessment & Plan Note (Signed)
BP Readings from Last 3 Encounters:  10/29/16 (!) 153/87  10/15/16 (!) 152/81  10/15/16 (!) 150/92   Orthostatic VS for the past 24 hrs:  BP- Lying Pulse- Lying BP- Sitting Pulse- Sitting BP- Standing at 0 minutes Pulse- Standing at 0 minutes  10/29/16 1039 167/90 70 155/77 62 (!) 136/92 58   Her blood pressure remained elevated, she just took her morning dose of amlodipine, never took HCTZ this morning.  She appears little dry and because of her complaint of intermittent postural lightheadedness, orthostatic vitals were checked which were positive.  -She was advised to increased her fluid intake, take her medications before coming to the clinic. -Continue current management and reassess during next follow-up visit.  When I discussed with her regarding switching of her antihypertensives to ACE inhibitor, she told me that she was given a different blood pressure medication in the past which resulted in her lip and face swelling, patient do not remember the name. It was not listed under her allergies either, most likely lisinopril.

## 2016-10-29 NOTE — Assessment & Plan Note (Signed)
She was still complaining of left-sided chest pain, increased with deep breathing and body movements. She was complaining of similar symptoms during previous office visit and was given Flexeril and ibuprofen, day according to patient it did help but did not completely eliminate her symptoms yet.  She was also seen in ED with similar complaints and ACS or pulmonary embolism was ruled out.  She is having a reproducible left chest wall pain,most likely musculoskeletal.  She was advised not to use ibuprofen because of her current creatinine of 1.23 and tried to control her pain with Tylenol , Flexeril and Voltaren Gel.

## 2016-10-30 LAB — URINALYSIS, ROUTINE W REFLEX MICROSCOPIC
Bilirubin, UA: NEGATIVE
Glucose, UA: NEGATIVE
Ketones, UA: NEGATIVE
Leukocytes, UA: NEGATIVE
Nitrite, UA: NEGATIVE
PH UA: 5.5 (ref 5.0–7.5)
Protein, UA: NEGATIVE
RBC UA: NEGATIVE
Specific Gravity, UA: 1.022 (ref 1.005–1.030)
Urobilinogen, Ur: 0.2 mg/dL (ref 0.2–1.0)

## 2016-10-30 LAB — BMP8+ANION GAP
Anion Gap: 14 mmol/L (ref 10.0–18.0)
BUN / CREAT RATIO: 18 (ref 9–23)
BUN: 21 mg/dL (ref 6–24)
CO2: 23 mmol/L (ref 20–29)
CREATININE: 1.15 mg/dL — AB (ref 0.57–1.00)
Calcium: 9.9 mg/dL (ref 8.7–10.2)
Chloride: 102 mmol/L (ref 96–106)
GFR, EST AFRICAN AMERICAN: 63 mL/min/{1.73_m2} (ref >59)
GFR, EST NON AFRICAN AMERICAN: 54 mL/min/{1.73_m2} — AB (ref >59)
Glucose: 80 mg/dL (ref 65–99)
Potassium: 4.7 mmol/L (ref 3.5–5.2)
Sodium: 139 mmol/L (ref 134–144)

## 2016-10-30 NOTE — Progress Notes (Signed)
Internal Medicine Clinic Attending  Case discussed with Dr. Amin at the time of the visit.  We reviewed the resident's history and exam and pertinent patient test results.  I agree with the assessment, diagnosis, and plan of care documented in the resident's note.    

## 2016-11-06 ENCOUNTER — Telehealth: Payer: Self-pay | Admitting: Internal Medicine

## 2016-11-06 NOTE — Telephone Encounter (Signed)
CALLED PATIENT, LMTCB, IT IS TIME TO RENEW GCCN CARD °

## 2016-11-19 ENCOUNTER — Ambulatory Visit: Payer: Self-pay | Admitting: Sports Medicine

## 2016-11-21 ENCOUNTER — Ambulatory Visit (INDEPENDENT_AMBULATORY_CARE_PROVIDER_SITE_OTHER): Payer: Self-pay | Admitting: Physician Assistant

## 2016-11-21 DIAGNOSIS — M7542 Impingement syndrome of left shoulder: Secondary | ICD-10-CM

## 2016-11-21 MED ORDER — METHYLPREDNISOLONE ACETATE 40 MG/ML IJ SUSP
40.0000 mg | INTRAMUSCULAR | Status: AC | PRN
  Administered 2016-11-21: 40 mg via INTRA_ARTICULAR

## 2016-11-21 MED ORDER — LIDOCAINE HCL 1 % IJ SOLN
3.0000 mL | INTRAMUSCULAR | Status: AC | PRN
  Administered 2016-11-21: 3 mL

## 2016-11-21 NOTE — Progress Notes (Signed)
Office Visit Note   Patient: Jean [name]           Date of Birth: [birth year]           MRN: [id number] Visit Date: 11/21/2016              Requested by: Thomasene Ripple, MD [address] [place], [place] [address] PCP: Thomasene Ripple, MD   Assessment & Plan: Visit Diagnoses:  1. Impingement syndrome of left shoulder     Plan: She'll work on range of motion of the left shoulder doing wall crawls pendulum Codman pendulum for flexion exercises which were all reviewed and shown the patient today. I explained to her that the injection today was both for diagnostic and therapeutic purposes hopefully. We'll see her back in 2 weeks' check progress lack of. Of note after the subacromial injection of the left shoulder she was able to move the arm easier with less pain.  Follow-Up Instructions: Return in about 2 weeks (around 12/05/2016).   Orders:  Orders Placed This Encounter  Procedures  . Large Joint Injection/Arthrocentesis   No orders of the defined types were placed in this encounter.     Procedures: Large Joint Inj Date/Time: 11/21/2016 2:21 PM Performed by: Pete Pelt Authorized by: Pete Pelt   Consent Given by:  Patient Indications:  Pain Location:  Shoulder Site:  L subacromial bursa Needle Size:  25 G Needle Length:  1.5 inches Approach:  Lateral Ultrasound Guidance: No   Fluoroscopic Guidance: No   Arthrogram: No   Medications:  3 mL lidocaine 1 %; 40 mg methylPREDNISolone acetate 40 MG/ML Aspiration Attempted: No   Patient tolerance:  Patient tolerated the procedure well with no immediate complications      Clinical Data: No additional findings.   Subjective: Neck and upper back pain with left shoulder pain  HPI 'Mrs.Lippy comes in today with upper spine pain and left shoulder pain. I last saw her in the office and she was having significant chest pain and referred her to the emergency room where she was worked up and had a negative  cardiac workup. She continues to have some chest discomfort left shoulder. She states now that she cannot raise her left arm above her head without moving it with the other arm. States at times that her left hand goes numb but she has no real radicular symptoms down the left arm. Review of Systems  Please see history of present illness Objective: Vital Signs: LMP 10/25/2016 (Exact Date)   Physical Exam  Constitutional: She is oriented to person, place, and time. She appears well-developed and well-nourished. No distress.  HENT:  Head: Normocephalic and atraumatic.  Eyes: EOM are normal.  Pulmonary/Chest: Effort normal.  Neurological: She is alert and oriented to person, place, and time.  Skin: She is not diaphoretic.  Psychiatric: She has a normal mood and affect. Her behavior is normal.    Ortho Exam Good range of motion the cervical spine without pain. Negative Spurling's. Tenderness along the medial border of the left scapula. Tenderness about the left shoulder girdle. Forward flexion actively 200 if she pushes her arm with the other arm she can bring him up to about 160. Passively I can bring her 180. She has bilateral 5 strengths with external and internal rotation against resistance bilateral shoulders. Negative empty can test bilaterally. Positive impingement testing on the left negative on the right. Deep tendon reflexes are equal and symmetric at the biceps triceps and  brachial radialis. Radial pulses are 2+. Grip strengths are equal. Sensation grossly intact throughout the fingers to light touch. Specialty Comments:  No specialty comments available.  Imaging: No results found.   PMFS History: Patient Active Problem List   Diagnosis Date Noted  . CKD (chronic kidney disease) 10/29/2016  . Chest wall pain 10/15/2016  . Menorrhagia 08/27/2016  . Chronic pain syndrome 05/10/2016  . Uterine leiomyoma 02/16/2016  . Liver lesion 12/01/2015  . Situational depression  10/19/2015  . HTN (hypertension) 07/08/2015  . Routine health maintenance 07/08/2015  . Tobacco abuse 07/08/2015   Past Medical History:  Diagnosis Date  . Arthritis   . Bronchitis    states at least 2-3 months ago  . Carpal tunnel syndrome    bilateral  . Depression   . GERD (gastroesophageal reflux disease)   . Headache    migraines  . Herpes   . Hypertension   . Uterine leiomyoma 02/16/2016    Family History  Problem Relation Age of Onset  . Cancer Mother   . Hypertension Mother   . Diabetes Mother   . Stroke Father     Past Surgical History:  Procedure Laterality Date  . NO PAST SURGERIES    . SALPINGECTOMY    . TOTAL HIP ARTHROPLASTY Left 01/20/2016   Procedure: LEFT TOTAL HIP ARTHROPLASTY ANTERIOR APPROACH;  Surgeon: Mcarthur Rossetti, MD;  Location: [facility];  Service: Orthopedics;  Laterality: Left;   Social History   Occupational History  . Not on file.   Social History Main Topics  . Smoking status: Current Some Day Smoker    Types: Cigarettes    Last attempt to quit: 04/06/2016  . Smokeless tobacco: Never Used     Comment:  ABOUT 3 CIGARETTES EVERY Day.  CUTTING BACK  . Alcohol use No     Comment: stopped September 2017  . Drug use: No     Comment: states last used Sept 1 or 2  x 1  . Sexual activity: Yes    Partners: Male

## 2016-11-26 ENCOUNTER — Ambulatory Visit (INDEPENDENT_AMBULATORY_CARE_PROVIDER_SITE_OTHER): Payer: Self-pay | Admitting: Internal Medicine

## 2016-11-26 ENCOUNTER — Encounter: Payer: Self-pay | Admitting: Internal Medicine

## 2016-11-26 ENCOUNTER — Ambulatory Visit: Payer: Self-pay | Admitting: Pharmacist

## 2016-11-26 ENCOUNTER — Encounter: Payer: Self-pay | Admitting: Pharmacist

## 2016-11-26 VITALS — BP 146/75 | HR 67 | Temp 98.1°F | Ht 63.0 in | Wt 138.6 lb

## 2016-11-26 DIAGNOSIS — K219 Gastro-esophageal reflux disease without esophagitis: Secondary | ICD-10-CM

## 2016-11-26 DIAGNOSIS — F4321 Adjustment disorder with depressed mood: Secondary | ICD-10-CM

## 2016-11-26 DIAGNOSIS — Z79899 Other long term (current) drug therapy: Secondary | ICD-10-CM

## 2016-11-26 DIAGNOSIS — Z719 Counseling, unspecified: Secondary | ICD-10-CM

## 2016-11-26 DIAGNOSIS — Z823 Family history of stroke: Secondary | ICD-10-CM

## 2016-11-26 DIAGNOSIS — I10 Essential (primary) hypertension: Secondary | ICD-10-CM

## 2016-11-26 DIAGNOSIS — F17210 Nicotine dependence, cigarettes, uncomplicated: Secondary | ICD-10-CM

## 2016-11-26 DIAGNOSIS — Z8249 Family history of ischemic heart disease and other diseases of the circulatory system: Secondary | ICD-10-CM

## 2016-11-26 MED ORDER — HYDRALAZINE HCL 25 MG PO TABS: 25.0000 mg | ORAL_TABLET | Freq: Two times a day (BID) | ORAL | 6 refills | Status: DC

## 2016-11-26 MED ORDER — SERTRALINE HCL 50 MG PO TABS: 100.0000 mg | ORAL_TABLET | Freq: Every day | ORAL | 3 refills | Status: DC

## 2016-11-26 NOTE — Assessment & Plan Note (Signed)
Patient's BP still above goal of 140/90 today. The patient was instructed to continue taking amlodipine 10 mg and hydrochlorothiazide 25 mg daily. She was also instructed to start hydralazine 25 mg BID. She was counseled to call or return to clinic if her episodes of dizziness continue with the current regimen.  Plan: -Continue amlodipine 10 mg and HCTZ 25 mg -START hydralazine 25 mg BID -Follow up in 4-6 weeks for BP recheck; return or discontinue medication if increased dizziness

## 2016-11-26 NOTE — Patient Instructions (Signed)
High Blood Pressure Diet Avoid Salt (Sodium) A high-sodium diet increases blood pressure in many people. In fact, the less sodium you eat, the better blood pressure control you might have. To lower the sodium in your diet, try these suggestions: Marland Kitchen Use a food diary to keep track of the salt in the foods you eat. . Aim for less than 2,300 milligrams (about 1 teaspoon of salt) each day. Ask your doctor if you should go lower, to 1,500 milligrams. . Read the nutritional facts label on every food package. o Select foods that have 5% or less of the "Daily Value" of sodium. o Avoid foods that have 20% or more Daily Value of sodium. Marland Kitchen Avoid canned foods, processed foods, lunch meats, and fast foods. . Use salt-free seasonings. Know What to Eat Potassium, magnesium, and fiber, on the other hand, may help control blood pressure. Fruits and vegetables are high in potassium, magnesium, and fiber, and they're low in sodium. Stick to whole fruits and veggies. Juice is less helpful, because the fiber is removed. Also, nuts, seeds, legumes, lean meats, and poultry are good sources of magnesium. To increase the amounts of natural potassium, magnesium, and fiber you take in, select from the following: apples . apricots . bananas . beet greens . broccoli . carrots . collards . green beans . dates . grapes . green peas . kale . lima beans . mangoes . melons . oranges . peaches . pineapples . potatoes . raisins . spinach . squash . strawberries . sweet potatoes . tangerines . tomatoes . tuna . yogurt (fat-free)  What Is the DASH Diet? Dietary Approaches to Stop Hypertension (DASH) is an eating plan rich in fruits, vegetables, whole grains, fish, poultry, nuts, legumes, and low-fat dairy. These foods are high in key nutrients such as potassium, magnesium, calcium, fiber, and protein. The DASH diet can lower blood pressure because it has less salt and sugar than the typical American diet. The  DASH diet cuts out desserts, sweetened beverages, fats, red meat, and processed meats. Women who followed the DASH diet for several years reduced their risks of coronary artery disease and stroke.  To start the DASH diet, follow these recommendations (based on 2,000 calories a day): Marland Kitchen Grains: 7-8 daily servings (serving sizes: 1 slice of bread, 1/2 cup cooked rice or pasta, 1 ounce dry cereal) . Vegetables: 4-5 daily servings (1 cup raw leafy greens, 1/2 cup cooked vegetable) . Fruits: 4-5 daily servings (1 medium fruit, 1/2 cup fresh or frozen fruit, 1/4 cup dried fruit, 6 ounces fruit juice) . Low-fat or fat-free dairy products: 2-3 daily servings (8 ounces milk, 1 cup yogurt, 1.5 ounces cheese) . Lean meat, poultry, and fish: 2 or fewer servings a day (3 ounces cooked meat, poultry, or fish) . Nuts, seeds, and legumes: 4-5 servings per week (1/3 cup nuts, 2 tablespoons seeds, 1/2 cup cooked dry beans or peas) . Fats and oils: 2-3 daily servings (1 teaspoon vegetable oil or soft margarine, 1 tablespoon low-fat mayonnaise, 2 tablespoons light salad dressing) . Sweets: less than 5 servings per week. (1 tablespoon sugar, jelly, or jam) Ask your doctor or a dietitian to help you start the DASH diet. They can tell you how many calories you need each day to maintain or get to a healthy weight. And then they can help you plan meals with foods you enjoy that meet the DASH guidelines.

## 2016-11-26 NOTE — Patient Instructions (Addendum)
Thank you for seeing Jean [name] today.  Please continue taking hydrochlorothiazide, 25 mg daily and amlodipine 10 mg daily for your blood pressure.   Please START taking hydralazine, 25 mg tablet. You will take 1 tablet in the morning and 1 tablet in the evening.  Please START taking 2 tablets of your sertraline 50 mg each day. You can take 2 tablets at once each day. Continue taking the medication you have until you run out. When you request refills you will receive more pills to reflect this change in dose.  Please return in 4-6 weeks for visit with PCP to check BP and mood symptoms.

## 2016-11-26 NOTE — Progress Notes (Signed)
   CC: Hypertension follow up  HPI:  [name] is a 51 y.o. with a PMH of HTN and GERD who presents for follow up of HTN. Per chart review the patient was complaining of intermittent dizziness and had positive orthostatic vitals. Her BP regimen at that time was amlodipine 10 mg and HCTZ 25 mg daily. The patient was instructed to increase hydration and monitor herself for symptoms of dehydration on this regimen.   Since her last visit the patient has experienced fewer episodes of dizziness. When she does have an episode, it lasts for a few seconds and self resolves. She has been able to take all over her anti-hypertensive medications as previously prescribed and states that she has had no difficulties with either of these medications.   She complains of decreased energy and depressed mood today. She states that she often has days when she cannot get out of bed and sleeps through the entire day. She thinks that her shoulder and hip pain is related to her depressed mood, because this often prevents her from getting up and completing her activities of daily living. She states that she has had thoughts of giving up but never thought of harming herself or others. She lives with a roommate and feels safe and supported by this individual.  Past Medical History:  Diagnosis Date  . Arthritis   . Bronchitis    states at least 2-3 months ago  . Carpal tunnel syndrome    bilateral  . Depression   . GERD (gastroesophageal reflux disease)   . Headache    migraines  . Herpes   . Hypertension   . Uterine leiomyoma 02/16/2016   Review of Systems:   Patient endorses shoulder pain, numbness and tingling of her fingers, and hip pain (all left sided) Patient denies worsening chest pain, SOB, lower extremity swelling, abdominal pain, and changes in bowel/bladder habits.  Physical Exam:  Vitals:   11/26/16 1014  BP: (!) 146/75  Pulse: 67  Temp: 98.1 F (36.7 C)  TempSrc: Oral  SpO2: 99%    Weight: 138 lb 9.6 oz (62.9 kg)  Height: 5\' 3"  (1.6 m)   Physical Exam  Constitutional: She appears well-developed and well-nourished. No distress.  Cardiovascular: Normal rate and regular rhythm.  Exam reveals no gallop and no friction rub.   Murmur (2/6 systolic murmur heard on L lower sternal border) heard. Pulmonary/Chest: Effort normal and breath sounds normal. No respiratory distress. She has no wheezes.  Abdominal: Soft. She exhibits no distension. There is no tenderness.  Musculoskeletal: She exhibits no edema (of lower extremities bilaterally) or tenderness (of lower extremities bilaterally).  Patient exam limited 2/2 pain in L shoulder. Patient cannot lift arm above L shoulder. ROM on R shoulder within normal limits.  Neurological:  5/5 strength of bilateral lower extremities with gross sensation intact. Exam of upper extremities limited 2/2 pain on LUE. Patient reports decreased sensation on L forearm compared to R.  Skin: Skin is warm and dry. Capillary refill takes less than 2 seconds. No rash noted. No erythema.  Psychiatric:  Patient has flat affect and endorses depressed mood and decreased energy. She denies SI/HI.   Assessment & Plan:   See Encounters Tab for problem based charting.  Patient seen with Dr. Daryll Drown.

## 2016-11-26 NOTE — Assessment & Plan Note (Signed)
The patient's continued endorsement of depressed mood and decreased energy level are concerning. She has been taking 50 mg of sertraline for sometime and states that she doesn't know if this has helped her mood. She was told to increase to 100 mg daily to see if this helps with her symptoms. Consider addition of bupropion at next visit if symptoms do not improve with change of therapy. At that time can also discuss referral to social work/behavioral health for therapy.  Plan: -Increase sertraline to 100 mg daily -Consider referral to therapist and/or adding bupropion if mood does not improve with new regimen

## 2016-11-26 NOTE — Progress Notes (Signed)
S: Jean [name] is a 51 y.o. female reports to clinical pharmacist appointment for medication management.  No Known Allergies Medication Sig  albuterol (PROVENTIL HFA;VENTOLIN HFA) 108 (90 Base) MCG/ACT inhaler Inhale 1-2 puffs into the lungs every 6 (six) hours as needed for wheezing or shortness of breath.  amLODipine (NORVASC) 10 MG tablet Take 1 tablet (10 mg total) by mouth daily.  aspirin EC 325 MG EC tablet Take 1 tablet (325 mg total) by mouth daily.  cyclobenzaprine (FLEXERIL) 10 MG tablet Take 0.5 tablets (5 mg total) by mouth 3 (three) times daily as needed for muscle spasms.  gabapentin (NEURONTIN) 100 MG capsule TAKE 1 Capsule BY MOUTH 3 TIMES DAILY  hydrALAZINE (APRESOLINE) 25 MG tablet Take 1 tablet (25 mg total) by mouth 2 (two) times daily.  hydrochlorothiazide (HYDRODIURIL) 25 MG tablet Take 1 tablet (25 mg total) by mouth daily.  sertraline (ZOLOFT) 50 MG tablet Take 2 tablets (100 mg total) by mouth daily.  tetrahydrozoline 0.05 % ophthalmic solution Place 1 drop into both eyes as needed (dry eyes).   Past Medical History:  Diagnosis Date  . Arthritis   . Bronchitis    states at least 2-3 months ago  . Carpal tunnel syndrome    bilateral  . Depression   . GERD (gastroesophageal reflux disease)   . Headache    migraines  . Herpes   . Hypertension   . Uterine leiomyoma 02/16/2016   Social History   Social History  . Marital status: Single    Spouse name: N/A  . Number of children: N/A  . Years of education: N/A   Social History Main Topics  . Smoking status: Current Some Day Smoker    Types: Cigarettes    Last attempt to quit: 04/06/2016  . Smokeless tobacco: Never Used     Comment:  ABOUT 3 CIGARETTES EVERY Day.  CUTTING BACK  . Alcohol use No     Comment: stopped September 2017  . Drug use: No     Comment: states last used Sept 1 or 2  x 1  . Sexual activity: Yes    Partners: Male   Other Topics Concern  . Not on file   Social History  Narrative  . No narrative on file   Family History  Problem Relation Age of Onset  . Cancer Mother   . Hypertension Mother   . Diabetes Mother   . Stroke Father    O: Component Value Date/Time   CHOL 230 (H) 07/08/2015 1028   HDL 128 07/08/2015 1028   TRIG 40 07/08/2015 1028   AST 18 06/22/2015 0202   ALT 12 (L) 06/22/2015 0202   NA 139 10/29/2016 1030   K 4.7 10/29/2016 1030   CL 102 10/29/2016 1030   CO2 23 10/29/2016 1030   GLUCOSE 80 10/29/2016 1030   GLUCOSE 81 10/15/2016 1138   HGBA1C 5.3 07/08/2015 1043   BUN 21 10/29/2016 1030   CREATININE 1.15 (H) 10/29/2016 1030   CALCIUM 9.9 10/29/2016 1030   GFRAA 63 10/29/2016 1030   WBC 7.1 10/15/2016 1138   HGB 13.2 10/15/2016 1138   HCT 40.3 10/15/2016 1138   PLT 411 (H) 10/15/2016 1138   Ht Readings from Last 2 Encounters:  11/26/16 5\' 3"  (1.6 m)  10/15/16 5\' 3"  (1.6 m)   Wt Readings from Last 2 Encounters:  11/26/16 138 lb 9.6 oz (62.9 kg)  10/29/16 136 lb (61.7 kg)   There is no height or weight  on file to calculate BMI. BP Readings from Last 3 Encounters:  11/26/16 (!) 146/75  10/29/16 (!) 153/87  10/15/16 (!) 152/81   A/P:  BP improved, hydralazine 25 mg BID was added and patient education was provided.  Patient requested support with smoking cessation. She states she smokes 3 cigarettes per day, starting 30 minutes or more after waking up. Patient enrolled in [employer]uit[facility] and was referred to [facility] smoking cessation classes. Provided 7 mg nicotine patches and 2 mg gum, advised patient to contact clinic if further support is needed.  Patient did confirm enrollment in to [facility] and success obtaining her medications.  An after visit summary was provided and patient advised to follow up if any changes in condition or questions regarding medications arise.  The patient verbalized understanding of information provided by repeating back concepts discussed.

## 2016-11-27 NOTE — Progress Notes (Signed)
Internal Medicine Clinic Attending  I saw and evaluated the patient.  I personally confirmed the key portions of the history and exam documented by Dr. Nedrud and I reviewed pertinent patient test results.  The assessment, diagnosis, and plan were formulated together and I agree with the documentation in the resident's note.  

## 2016-11-29 ENCOUNTER — Ambulatory Visit: Payer: Self-pay

## 2016-11-29 NOTE — Patient Instructions (Addendum)
Your procedure is scheduled on:  Tuesday, August 7  Enter through the Main Entrance of [facility] at:  1:45 pm  Pick up the phone at the desk and dial [phone].  Call this number if you have problems the morning of surgery: [phone].  Remember: You may have dry toast prior to 7 am on Tuesday, day of surgery.   Do NOT drink clear liquids (including water) after 9 AM Tuesday, day of surgery  Take these medicines the morning of surgery with a SIP OF WATER amlodipine, gabapentin, hydralazine, zoloft and zantac.   Bring Albuterol inhaler with you on day of surgery.  Do Not smoke on the day of surgery.  Stop herbal medications and supplements at this time.  Do NOT wear jewelry (body piercing), metal hair clips/bobby pins, make-up, or nail polish. Do NOT wear lotions, powders, or perfumes.  You may wear deoderant. Do NOT shave for 48 hours prior to surgery. Do NOT bring valuables to the hospital.  Leave suitcase in car.  After surgery it may be brought to your room.  For patients admitted to the hospital, checkout time is 11:00 AM the day of discharge. Have a responsible adult drive you home and stay with you for 24 hours after your procedure.  Home with [facility] cell [phone]

## 2016-12-03 ENCOUNTER — Encounter: Payer: Self-pay | Admitting: Sports Medicine

## 2016-12-03 ENCOUNTER — Ambulatory Visit (INDEPENDENT_AMBULATORY_CARE_PROVIDER_SITE_OTHER): Payer: Self-pay | Admitting: Sports Medicine

## 2016-12-03 VITALS — BP 126/86 | Ht 63.0 in | Wt 138.0 lb

## 2016-12-03 DIAGNOSIS — M25552 Pain in left hip: Secondary | ICD-10-CM

## 2016-12-03 NOTE — Progress Notes (Signed)
   Subjective:    Patient ID: Jean [name], female    DOB: 01-12-1966, 51 y.o.   MRN: [id number]  HPI chief complaint: Bilateral lower extremity pain  Patient comes in today complaining of diffuse bilateral lower extremity pain and weakness. Recent CT scan of her left hip showed her hip prosthesis to be well positioned. She denies any recent injury. She takes gabapentin and Flexeril as needed.    Review of Systems    as above Objective:   Physical Exam  Sitting comfortable in exam room, no acute distress  Patient has global weakness of both lower extremities but nothing focal. She has atrophy of both quad muscles. She has good hip range of motion bilaterally but it is reproducible of left groin pain. Neurovascularly intact distally.      Assessment & Plan:   Chronic bilateral lower extremity pain Status [name] left total hip arthroplasty  Patient has responded well to IM Depo-Medrol injections in the past but she is scheduled for hysterectomy next week. Therefore, I'm going to hold on any further treatment until after her surgery. She can return to the office for repeat IM Depo-Medrol injections as needed. I think she would also benefit from physical therapy to help strengthen her lower extremities.

## 2016-12-04 ENCOUNTER — Other Ambulatory Visit: Payer: Self-pay

## 2016-12-04 ENCOUNTER — Encounter (HOSPITAL_COMMUNITY): Payer: Self-pay

## 2016-12-04 ENCOUNTER — Encounter (HOSPITAL_COMMUNITY)
Admission: RE | Admit: 2016-12-04 | Discharge: 2016-12-04 | Disposition: A | Discharge status: Home / Self Care | Payer: Medicaid Other | Source: Ambulatory Visit | Attending: Obstetrics and Gynecology | Admitting: Obstetrics and Gynecology

## 2016-12-04 DIAGNOSIS — N920 Excessive and frequent menstruation with regular cycle: Secondary | ICD-10-CM | POA: Insufficient documentation

## 2016-12-04 DIAGNOSIS — R001 Bradycardia, unspecified: Secondary | ICD-10-CM | POA: Insufficient documentation

## 2016-12-04 DIAGNOSIS — Z01818 Encounter for other preprocedural examination: Secondary | ICD-10-CM | POA: Insufficient documentation

## 2016-12-04 DIAGNOSIS — I517 Cardiomegaly: Secondary | ICD-10-CM | POA: Insufficient documentation

## 2016-12-04 DIAGNOSIS — D259 Leiomyoma of uterus, unspecified: Secondary | ICD-10-CM | POA: Insufficient documentation

## 2016-12-04 HISTORY — DX: Low back pain, unspecified: M54.50

## 2016-12-04 HISTORY — DX: Low back pain: M54.5

## 2016-12-04 HISTORY — DX: Dyspnea, unspecified: R06.00

## 2016-12-04 HISTORY — DX: Other chronic pain: G89.29

## 2016-12-04 LAB — CBC
HEMATOCRIT: 37.9 % (ref 36.0–46.0)
HEMOGLOBIN: 12.7 g/dL (ref 12.0–15.0)
MCH: 32.7 pg (ref 26.0–34.0)
MCHC: 33.5 g/dL (ref 30.0–36.0)
MCV: 97.7 fL (ref 78.0–100.0)
Platelets: 365 10*3/uL (ref 150–400)
RBC: 3.88 MIL/uL (ref 3.87–5.11)
RDW: 14.1 % (ref 11.5–15.5)
WBC: 8.9 10*3/uL (ref 4.0–10.5)

## 2016-12-04 LAB — BASIC METABOLIC PANEL
ANION GAP: 9 (ref 5–15)
BUN: 20 mg/dL (ref 6–20)
CO2: 27 mmol/L (ref 22–32)
Calcium: 9.9 mg/dL (ref 8.9–10.3)
Chloride: 101 mmol/L (ref 101–111)
Creatinine, Ser: 1.35 mg/dL — ABNORMAL HIGH (ref 0.44–1.00)
GFR calc Af Amer: 51 mL/min — ABNORMAL LOW (ref >60)
GFR calc non Af Amer: 44 mL/min — ABNORMAL LOW (ref >60)
GLUCOSE: 98 mg/dL (ref 65–99)
POTASSIUM: 4.5 mmol/L (ref 3.5–5.1)
Sodium: 137 mmol/L (ref 135–145)

## 2016-12-04 LAB — ABO/RH: ABO/RH(D): O POS

## 2016-12-04 LAB — SURGICAL PCR SCREEN
MRSA, PCR: NEGATIVE
STAPHYLOCOCCUS AUREUS: NEGATIVE

## 2016-12-04 LAB — TYPE AND SCREEN
ABO/RH(D): O POS
Antibody Screen: NEGATIVE

## 2016-12-04 NOTE — Pre-Procedure Instructions (Signed)
Reviewed patient's medica/surgicall history, medications and EKG with Dr. Lyndle Herrlich.  [place] for surgery.  No orders given.

## 2016-12-05 ENCOUNTER — Ambulatory Visit (INDEPENDENT_AMBULATORY_CARE_PROVIDER_SITE_OTHER): Payer: Self-pay | Admitting: Physician Assistant

## 2016-12-05 DIAGNOSIS — M7542 Impingement syndrome of left shoulder: Secondary | ICD-10-CM

## 2016-12-05 NOTE — Progress Notes (Signed)
Office Visit Note   Patient: Jean [name]           Date of Birth: 04-18-1966           MRN: [id number] Visit Date: 12/05/2016              Requested by: Thomasene Ripple, MD [address] [place], [place] [address] PCP: Thomasene Ripple, MD   Assessment & Plan: Visit Diagnoses:  1. Impingement syndrome of left shoulder     Plan: We'll send her to physical therapy for her left shoulder for range of motion, home exercise program and modalities. Like to see her back in 4 weeks to check her progress lack of. Encouraged her to continue with a home exercise program either side showed her last visit. Verbally went over these exercises with her. Explained to her that it was imperative that we Her range of motion trending towards improvement while she was waiting to be approved for physical therapy.  Follow-Up Instructions: No Follow-up on file.   Orders:  Orders Placed This Encounter  Procedures  . Ambulatory referral to Physical Therapy   No orders of the defined types were placed in this encounter.     Procedures: No procedures performed   Clinical Data: No additional findings.   Subjective: Left shoulder pain  HPI  Mrs. Posch returns today for follow-up of her left shoulder. She states still having pain and tightness in the shoulder but her range of motion overall is improved. She has severe pain the night after the injection. She's scheduled for hysterectomy due to fibroids X Tuesday. But states that they're sports medicine physician assistant in physical therapy for her back soon after her surgery.  Review of Systems Denies any radicular symptoms down either arm. No real neck pain. Otherwise please see history of present illness.  Objective: Vital Signs: LMP 11/10/2016 (Approximate)   Physical Exam  Constitutional: She is oriented to person, place, and time. She appears well-developed and well-nourished. No distress.  Neurological: She is alert and oriented to  person, place, and time.  Skin: She is not diaphoretic.  Psychiatric: She has a normal mood and affect.    Ortho Exam Active forward flexion to 110 passively I can now bring her to 180. 5 out of 5 strength with external and internal rotation against resistance bilateral shoulders. Positive impingement testing on the left. She has good range of motion of her cervical spine today without pain. Negative Spurling. Some tenderness in her left trapezius region. Negative liftoff test bilaterally Specialty Comments:  No specialty comments available.  Imaging: No results found.   PMFS History: Patient Active Problem List   Diagnosis Date Noted  . CKD (chronic kidney disease) 10/29/2016  . Chest wall pain 10/15/2016  . Menorrhagia 08/27/2016  . Chronic pain syndrome 05/10/2016  . Uterine leiomyoma 02/16/2016  . Liver lesion 12/01/2015  . Situational depression 10/19/2015  . HTN (hypertension) 07/08/2015  . Routine health maintenance 07/08/2015  . Tobacco abuse 07/08/2015   Past Medical History:  Diagnosis Date  . Arthritis    hip  . Bronchitis    states at least 2-3 months ago  . Carpal tunnel syndrome    bilateral  . Chronic lower back pain   . Depression   . Dyspnea    with exertion  . GERD (gastroesophageal reflux disease)   . Headache    migraines  . Herpes   . Hypertension   . SVD (spontaneous vaginal delivery)  x 2  . Uterine leiomyoma 02/16/2016    Family History  Problem Relation Age of Onset  . Cancer Mother   . Hypertension Mother   . Diabetes Mother   . Stroke Father     Past Surgical History:  Procedure Laterality Date  . SALPINGECTOMY     lap - ectopic preg  . TOTAL HIP ARTHROPLASTY Left 01/20/2016   Procedure: LEFT TOTAL HIP ARTHROPLASTY ANTERIOR APPROACH;  Surgeon: Mcarthur Rossetti, MD;  Location: [facility];  Service: Orthopedics;  Laterality: Left;   Social History   Occupational History  . Not on file.   Social History Main Topics  .  Smoking status: Former Smoker    Packs/day: 0.25    Years: 11.00    Types: Cigarettes    Quit date: 11/28/2016  . Smokeless tobacco: Never Used  . Alcohol use 1.2 oz/week    2 Cans of beer per week  . Drug use: No     Comment: states last use approx 12/01/16  . Sexual activity: Yes    Partners: Male    Birth control/ protection: None

## 2016-12-07 ENCOUNTER — Ambulatory Visit: Payer: Self-pay

## 2016-12-11 ENCOUNTER — Encounter (HOSPITAL_COMMUNITY): Payer: Self-pay | Admitting: *Deleted

## 2016-12-11 ENCOUNTER — Encounter (HOSPITAL_COMMUNITY)
Admission: RE | Disposition: A | Discharge status: Home / Self Care | Payer: Self-pay | Source: Ambulatory Visit | Attending: Obstetrics and Gynecology

## 2016-12-11 ENCOUNTER — Ambulatory Visit (HOSPITAL_COMMUNITY): Payer: Self-pay | Admitting: Anesthesiology

## 2016-12-11 ENCOUNTER — Observation Stay (HOSPITAL_COMMUNITY)
Admission: RE | Admit: 2016-12-11 | Discharge: 2016-12-12 | Disposition: A | Discharge status: Home / Self Care | Payer: Self-pay | Source: Ambulatory Visit | Attending: Obstetrics and Gynecology | Admitting: Obstetrics and Gynecology

## 2016-12-11 DIAGNOSIS — N920 Excessive and frequent menstruation with regular cycle: Secondary | ICD-10-CM | POA: Insufficient documentation

## 2016-12-11 DIAGNOSIS — D251 Intramural leiomyoma of uterus: Principal | ICD-10-CM | POA: Insufficient documentation

## 2016-12-11 DIAGNOSIS — K219 Gastro-esophageal reflux disease without esophagitis: Secondary | ICD-10-CM | POA: Insufficient documentation

## 2016-12-11 DIAGNOSIS — Z79899 Other long term (current) drug therapy: Secondary | ICD-10-CM | POA: Insufficient documentation

## 2016-12-11 DIAGNOSIS — N838 Other noninflammatory disorders of ovary, fallopian tube and broad ligament: Secondary | ICD-10-CM | POA: Insufficient documentation

## 2016-12-11 DIAGNOSIS — Z87891 Personal history of nicotine dependence: Secondary | ICD-10-CM | POA: Insufficient documentation

## 2016-12-11 DIAGNOSIS — I10 Essential (primary) hypertension: Secondary | ICD-10-CM | POA: Insufficient documentation

## 2016-12-11 DIAGNOSIS — F329 Major depressive disorder, single episode, unspecified: Secondary | ICD-10-CM | POA: Insufficient documentation

## 2016-12-11 HISTORY — PX: VAGINAL HYSTERECTOMY: SHX2639

## 2016-12-11 LAB — BASIC METABOLIC PANEL
ANION GAP: 7 (ref 5–15)
BUN: 20 mg/dL (ref 6–20)
CO2: 28 mmol/L (ref 22–32)
Calcium: 9.8 mg/dL (ref 8.9–10.3)
Chloride: 104 mmol/L (ref 101–111)
Creatinine, Ser: 1.3 mg/dL — ABNORMAL HIGH (ref 0.44–1.00)
GFR calc Af Amer: 53 mL/min — ABNORMAL LOW (ref >60)
GFR calc non Af Amer: 46 mL/min — ABNORMAL LOW (ref >60)
GLUCOSE: 91 mg/dL (ref 65–99)
POTASSIUM: 4.6 mmol/L (ref 3.5–5.1)
Sodium: 139 mmol/L (ref 135–145)

## 2016-12-11 LAB — PREGNANCY, URINE: Preg Test, Ur: NEGATIVE

## 2016-12-11 SURGERY — HYSTERECTOMY, VAGINAL
Anesthesia: Epidural | Site: Vagina | Laterality: Right

## 2016-12-11 MED ORDER — ROCURONIUM BROMIDE 100 MG/10ML IV SOLN
INTRAVENOUS | Status: DC | PRN
  Administered 2016-12-11: 30 mg via INTRAVENOUS

## 2016-12-11 MED ORDER — CEFAZOLIN SODIUM-DEXTROSE 2-4 GM/100ML-% IV SOLN
INTRAVENOUS | Status: AC
  Filled 2016-12-11: qty 100

## 2016-12-11 MED ORDER — SCOPOLAMINE 1 MG/3DAYS TD PT72
MEDICATED_PATCH | TRANSDERMAL | Status: AC
  Administered 2016-12-11: 1.5 mg via TRANSDERMAL
  Filled 2016-12-11: qty 1

## 2016-12-11 MED ORDER — KETOROLAC TROMETHAMINE 30 MG/ML IJ SOLN
INTRAMUSCULAR | Status: AC
  Filled 2016-12-11: qty 1

## 2016-12-11 MED ORDER — IBUPROFEN 800 MG PO TABS: 800.0000 mg | ORAL_TABLET | Freq: Three times a day (TID) | ORAL | Status: DC

## 2016-12-11 MED ORDER — SUGAMMADEX SODIUM 200 MG/2ML IV SOLN
INTRAVENOUS | Status: DC | PRN
  Administered 2016-12-11: 200 mg via INTRAVENOUS

## 2016-12-11 MED ORDER — GABAPENTIN 100 MG PO CAPS
100.0000 mg | ORAL_CAPSULE | Freq: Three times a day (TID) | ORAL | Status: DC
  Administered 2016-12-11 – 2016-12-12 (×2): 100 mg via ORAL
  Filled 2016-12-11 (×5): qty 1

## 2016-12-11 MED ORDER — ONDANSETRON HCL 4 MG/2ML IJ SOLN: 4.0000 mg | Freq: Four times a day (QID) | INTRAMUSCULAR | Status: DC | PRN

## 2016-12-11 MED ORDER — CEFOTETAN DISODIUM 2 G IJ SOLR: INTRAMUSCULAR | Status: DC | PRN

## 2016-12-11 MED ORDER — LACTATED RINGERS IV SOLN
INTRAVENOUS | Status: DC
  Administered 2016-12-11 (×2): via INTRAVENOUS

## 2016-12-11 MED ORDER — PROPOFOL 10 MG/ML IV BOLUS
INTRAVENOUS | Status: AC
  Filled 2016-12-11: qty 20

## 2016-12-11 MED ORDER — AMLODIPINE BESYLATE 10 MG PO TABS
10.0000 mg | ORAL_TABLET | Freq: Every day | ORAL | Status: DC
  Administered 2016-12-12: 10 mg via ORAL
  Filled 2016-12-11: qty 1

## 2016-12-11 MED ORDER — OXYCODONE-ACETAMINOPHEN 5-325 MG PO TABS: 1.0000 | ORAL_TABLET | ORAL | Status: DC | PRN

## 2016-12-11 MED ORDER — FENTANYL CITRATE (PF) 100 MCG/2ML IJ SOLN
25.0000 ug | INTRAMUSCULAR | Status: DC | PRN
  Administered 2016-12-11 (×2): 25 ug via INTRAVENOUS

## 2016-12-11 MED ORDER — SOD CITRATE-CITRIC ACID 500-334 MG/5ML PO SOLN
30.0000 mL | ORAL | Status: AC
  Administered 2016-12-11: 30 mL via ORAL

## 2016-12-11 MED ORDER — HYDROCHLOROTHIAZIDE 25 MG PO TABS: 25.0000 mg | ORAL_TABLET | Freq: Every day | ORAL | Status: DC

## 2016-12-11 MED ORDER — CEFAZOLIN SODIUM-DEXTROSE 2-4 GM/100ML-% IV SOLN
2.0000 g | INTRAVENOUS | Status: AC
  Administered 2016-12-11: 2 g via INTRAVENOUS

## 2016-12-11 MED ORDER — KETOROLAC TROMETHAMINE 15 MG/ML IJ SOLN
INTRAMUSCULAR | Status: DC | PRN
Start: 2016-12-11 — End: 2016-12-11
  Administered 2016-12-11: 15 mg via INTRAVENOUS

## 2016-12-11 MED ORDER — ROCURONIUM BROMIDE 100 MG/10ML IV SOLN
INTRAVENOUS | Status: AC
  Filled 2016-12-11: qty 1

## 2016-12-11 MED ORDER — PROPOFOL 10 MG/ML IV BOLUS
INTRAVENOUS | Status: DC | PRN
  Administered 2016-12-11: 160 mg via INTRAVENOUS

## 2016-12-11 MED ORDER — LIDOCAINE HCL (CARDIAC) 20 MG/ML IV SOLN
INTRAVENOUS | Status: DC | PRN
  Administered 2016-12-11: 60 mg via INTRAVENOUS

## 2016-12-11 MED ORDER — FENTANYL CITRATE (PF) 250 MCG/5ML IJ SOLN
INTRAMUSCULAR | Status: AC
  Filled 2016-12-11: qty 5

## 2016-12-11 MED ORDER — ONDANSETRON HCL 4 MG PO TABS: 4.0000 mg | ORAL_TABLET | Freq: Four times a day (QID) | ORAL | Status: DC | PRN

## 2016-12-11 MED ORDER — ZOLPIDEM TARTRATE 5 MG PO TABS: 5.0000 mg | ORAL_TABLET | Freq: Every evening | ORAL | Status: DC | PRN

## 2016-12-11 MED ORDER — SOD CITRATE-CITRIC ACID 500-334 MG/5ML PO SOLN
ORAL | Status: AC
  Administered 2016-12-11: 30 mL via ORAL
  Filled 2016-12-11: qty 15

## 2016-12-11 MED ORDER — FENTANYL CITRATE (PF) 100 MCG/2ML IJ SOLN
INTRAMUSCULAR | Status: DC | PRN
  Administered 2016-12-11: 150 ug via INTRAVENOUS
  Administered 2016-12-11: 100 ug via INTRAVENOUS

## 2016-12-11 MED ORDER — SIMETHICONE 80 MG PO CHEW: 80.0000 mg | CHEWABLE_TABLET | Freq: Four times a day (QID) | ORAL | Status: DC | PRN

## 2016-12-11 MED ORDER — DEXAMETHASONE SODIUM PHOSPHATE 10 MG/ML IJ SOLN
INTRAMUSCULAR | Status: DC | PRN
Start: 2016-12-11 — End: 2016-12-11
  Administered 2016-12-11: 4 mg via INTRAVENOUS

## 2016-12-11 MED ORDER — ONDANSETRON HCL 4 MG/2ML IJ SOLN
INTRAMUSCULAR | Status: AC
  Filled 2016-12-11: qty 2

## 2016-12-11 MED ORDER — FENTANYL CITRATE (PF) 100 MCG/2ML IJ SOLN
INTRAMUSCULAR | Status: AC
Start: 2016-12-11 — End: 2016-12-11
  Administered 2016-12-11: 25 ug via INTRAVENOUS
  Filled 2016-12-11: qty 2

## 2016-12-11 MED ORDER — HYDROMORPHONE HCL 1 MG/ML IJ SOLN: 0.2000 mg | INTRAMUSCULAR | Status: DC | PRN

## 2016-12-11 MED ORDER — MIDAZOLAM HCL 2 MG/2ML IJ SOLN
INTRAMUSCULAR | Status: AC
  Filled 2016-12-11: qty 2

## 2016-12-11 MED ORDER — HYDRALAZINE HCL 25 MG PO TABS
25.0000 mg | ORAL_TABLET | Freq: Two times a day (BID) | ORAL | Status: DC
  Administered 2016-12-11 – 2016-12-12 (×2): 25 mg via ORAL
  Filled 2016-12-11 (×4): qty 1

## 2016-12-11 MED ORDER — KETOROLAC TROMETHAMINE 15 MG/ML IJ SOLN
15.0000 mg | Freq: Four times a day (QID) | INTRAMUSCULAR | Status: DC
  Administered 2016-12-11 – 2016-12-12 (×3): 15 mg via INTRAVENOUS
  Filled 2016-12-11 (×4): qty 1

## 2016-12-11 MED ORDER — ONDANSETRON HCL 4 MG/2ML IJ SOLN
INTRAMUSCULAR | Status: DC | PRN
  Administered 2016-12-11: 4 mg via INTRAVENOUS

## 2016-12-11 MED ORDER — SCOPOLAMINE 1 MG/3DAYS TD PT72
1.0000 | MEDICATED_PATCH | Freq: Once | TRANSDERMAL | Status: DC
  Administered 2016-12-11: 1.5 mg via TRANSDERMAL

## 2016-12-11 MED ORDER — LACTATED RINGERS IV SOLN
INTRAVENOUS | Status: DC
  Administered 2016-12-11: 23:00:00 via INTRAVENOUS

## 2016-12-11 MED ORDER — DEXAMETHASONE SODIUM PHOSPHATE 4 MG/ML IJ SOLN
INTRAMUSCULAR | Status: AC
  Filled 2016-12-11: qty 1

## 2016-12-11 MED ORDER — MIDAZOLAM HCL 2 MG/2ML IJ SOLN
INTRAMUSCULAR | Status: DC | PRN
  Administered 2016-12-11: 2 mg via INTRAVENOUS

## 2016-12-11 MED ORDER — LIDOCAINE HCL (CARDIAC) 20 MG/ML IV SOLN
INTRAVENOUS | Status: AC
  Filled 2016-12-11: qty 5

## 2016-12-11 SURGICAL SUPPLY — 24 items
BLADE SURG 10 STRL SS (BLADE) ×2 IMPLANT
CANISTER SUCT 3000ML PPV (MISCELLANEOUS) ×2 IMPLANT
CLOTH BEACON ORANGE TIMEOUT ST (SAFETY) ×2 IMPLANT
CONT PATH 16OZ SNAP LID 3702 (MISCELLANEOUS) ×2 IMPLANT
GLOVE BIO SURGEON STRL SZ7.5 (GLOVE) ×2 IMPLANT
GLOVE BIO SURGEON STRL SZ8 (GLOVE) ×2 IMPLANT
GLOVE BIOGEL PI IND STRL 6.5 (GLOVE) ×1 IMPLANT
GLOVE BIOGEL PI IND STRL 7.0 (GLOVE) ×2 IMPLANT
GLOVE BIOGEL PI INDICATOR 6.5 (GLOVE) ×1
GLOVE BIOGEL PI INDICATOR 7.0 (GLOVE) ×2
GOWN STRL REUS W/TWL LRG LVL3 (GOWN DISPOSABLE) ×6 IMPLANT
GOWN STRL REUS W/TWL XL LVL3 (GOWN DISPOSABLE) ×2 IMPLANT
NS IRRIG 1000ML POUR BTL (IV SOLUTION) ×2 IMPLANT
PACK TRENDGUARD 450 HYBRID PRO (MISCELLANEOUS) ×1 IMPLANT
PACK VAGINAL WOMENS (CUSTOM PROCEDURE TRAY) ×2 IMPLANT
PAD OB MATERNITY 4.3X12.25 (PERSONAL CARE ITEMS) ×2 IMPLANT
SUT VIC AB 2-0 CT1 18 (SUTURE) IMPLANT
SUT VIC AB 2-0 CT1 27 (SUTURE) ×2
SUT VIC AB 2-0 CT1 TAPERPNT 27 (SUTURE) ×1 IMPLANT
SUT VIC AB PLUS 45CM 1-MO-4 (SUTURE) ×6 IMPLANT
SUT VICRYL 1 TIES 12X18 (SUTURE) ×2 IMPLANT
TOWEL OR 17X24 6PK STRL BLUE (TOWEL DISPOSABLE) ×4 IMPLANT
TRAY FOLEY CATH SILVER 14FR (SET/KITS/TRAYS/PACK) ×2 IMPLANT
TRENDGUARD 450 HYBRID PRO PACK (MISCELLANEOUS) ×2

## 2016-12-11 NOTE — Anesthesia Procedure Notes (Signed)
Procedure Name: Intubation Date/Time: 12/11/2016 3:28 PM Performed by: Tobin Chad Pre-anesthesia Checklist: Patient identified, Emergency Drugs available, Suction available, Patient being monitored and Timeout performed Patient Re-evaluated:Patient Re-evaluated prior to induction Oxygen Delivery Method: Circle system utilized Preoxygenation: Pre-oxygenation with 100% oxygen Induction Type: IV induction Ventilation: Mask ventilation without difficulty Laryngoscope Size: Mac and 3 Grade View: Grade II Tube type: Oral Tube size: 7.0 mm Number of attempts: 2 Airway Equipment and Method: Stylet Placement Confirmation: ETT inserted through vocal cords under direct vision,  positive ETCO2 and breath sounds checked- equal and bilateral Secured at: 22 cm Tube secured with: Tape Dental Injury: Teeth and Oropharynx as per pre-operative assessment  Comments: DLx1 tube in esophagus upon assessment, removed ETT, DL x1 and tube placement verified in correct placement. Patient with stable VS and no complications from induction noted.

## 2016-12-11 NOTE — Addendum Note (Signed)
Addendum  created 12/11/16 1850 by Lynda Rainwater, MD   Sign clinical note

## 2016-12-11 NOTE — H&P (Signed)
Jean [name] is an 51 y.o. [id number] female who presents today for [facility] and salpingectomy due to heavy cycles and uterine fibroids. She reports regular monthly cycle, heavy with cramps and clots. H/O IDA thought to be related to heavy cycles. U/S has confirmed several uterine fibroids as well. EMBX was completed and was normal. Pt desires definite therapy.   Last pap March 2016 normal H/O Left ectopic and left hip replacement First trimester SAb TSVD x 1  Menstrual History: Menarche age: 5 No LMP recorded.    Past Medical History:  Diagnosis Date  . Arthritis    hip  . Bronchitis    states at least 2-3 months ago  . Carpal tunnel syndrome    bilateral  . Chronic lower back pain   . Depression   . Dyspnea    with exertion  . GERD (gastroesophageal reflux disease)   . Headache    migraines  . Herpes   . Hypertension   . SVD (spontaneous vaginal delivery)    x 2  . Uterine leiomyoma 02/16/2016    Past Surgical History:  Procedure Laterality Date  . SALPINGECTOMY     lap - ectopic preg  . TOTAL HIP ARTHROPLASTY Left 01/20/2016   Procedure: LEFT TOTAL HIP ARTHROPLASTY ANTERIOR APPROACH;  Surgeon: Mcarthur Rossetti, MD;  Location: [facility];  Service: Orthopedics;  Laterality: Left;    Family History  Problem Relation Age of Onset  . Cancer Mother   . Hypertension Mother   . Diabetes Mother   . Stroke Father     Social History:  reports that she quit smoking about 1 weeks ago. Her smoking use included Cigarettes. She has a 2.75 pack-year smoking history. She has never used smokeless tobacco. She reports that she drinks about 1.2 oz of alcohol per week . She reports that she does not use drugs.  Allergies: No Known Allergies  No prescriptions prior to admission.    Review of Systems  Constitutional: Negative.   Respiratory: Negative.   Cardiovascular: Negative.   Gastrointestinal: Negative.   Genitourinary: Negative.     There were no vitals taken for this  visit. Physical Exam  Constitutional: She appears well-developed and well-nourished.  Cardiovascular: Normal rate, regular rhythm and normal heart sounds.   Respiratory: Effort normal and breath sounds normal.  GI: Soft. Bowel sounds are normal.  Genitourinary:  Genitourinary Comments: Nl EGBUS uterus enlarged 10-12 weeks, mobile, no adnexal masses    No results found for this or any previous visit (from the past 24 hour(s)).  No results found.  Assessment/Plan: Heavy cycles Uterine fibroids  Pt desires definite therapy. TVH with salpingectomy discussed with pt. R/B/post op care reviewed. Pt has verbalized understanding and agrees to proceed with procedure.   Chancy Milroy 12/11/2016, 9:49 AM

## 2016-12-11 NOTE — Anesthesia Postprocedure Evaluation (Signed)
Anesthesia Post Note  Patient: REDONNA WILBERT  Procedure(s) Performed: Procedure(s) (LRB): HYSTERECTOMY VAGINAL WITH RIGHT SALPINGECTOMY (Right)     Patient location during evaluation: Mother Baby Anesthesia Type: Epidural Level of consciousness: awake and alert Pain management: pain level controlled Vital Signs Assessment: post-procedure vital signs reviewed and stable Respiratory status: spontaneous breathing, nonlabored ventilation and respiratory function stable Cardiovascular status: stable Postop Assessment: no headache, no backache and epidural receding Anesthetic complications: no    Last Vitals:  Vitals:   12/11/16 1715 12/11/16 1800  BP:    Pulse:    Resp:    Temp: (!) 36.4 C 36.9 C    Last Pain:  Vitals:   12/11/16 1815  TempSrc:   PainSc: 9    Pain Goal: Patients Stated Pain Goal: 3 (12/11/16 1815)               Lynda Rainwater

## 2016-12-11 NOTE — Transfer of Care (Signed)
Immediate Anesthesia Transfer of Care Note  Patient: Jean [name]  Procedure(s) Performed: Procedure(s): HYSTERECTOMY VAGINAL WITH RIGHT SALPINGECTOMY (Right)  Patient Location: PACU  Anesthesia Type:General  Level of Consciousness: awake, alert  and oriented  Airway & Oxygen Therapy: Patient Spontanous Breathing and Patient connected to nasal cannula oxygen  Post-op Assessment: Report given to RN and Post -op Vital signs reviewed and stable  Post vital signs: Reviewed and stable  Last Vitals:  Vitals:   12/11/16 1343 12/11/16 1354  BP: (!) 146/110 (!) 148/76  Pulse: (!) 54   Resp: 16   Temp: 36.8 C     Last Pain:  Vitals:   12/11/16 1343  TempSrc: Oral  PainSc: 0-No pain      Patients Stated Pain Goal: 4 (70/17/79 3903)  Complications: No apparent anesthesia complications

## 2016-12-11 NOTE — Anesthesia Postprocedure Evaluation (Signed)
Anesthesia Post Note  Patient: Jean [name]  Procedure(s) Performed: Procedure(s) (LRB): HYSTERECTOMY VAGINAL WITH RIGHT SALPINGECTOMY (Right)     Patient location during evaluation: PACU Anesthesia Type: General Level of consciousness: awake and alert Pain management: pain level controlled Vital Signs Assessment: post-procedure vital signs reviewed and stable Respiratory status: spontaneous breathing, nonlabored ventilation and respiratory function stable Cardiovascular status: blood pressure returned to baseline and stable Postop Assessment: no signs of nausea or vomiting Anesthetic complications: no    Last Vitals:  Vitals:   12/11/16 1715 12/11/16 1800  BP:    Pulse:    Resp:    Temp: (!) 36.4 C 36.9 C    Last Pain:  Vitals:   12/11/16 1815  TempSrc:   PainSc: 9    Pain Goal: Patients Stated Pain Goal: 3 (12/11/16 1815)               Lynda Rainwater

## 2016-12-11 NOTE — Anesthesia Preprocedure Evaluation (Addendum)
Anesthesia Evaluation  Patient identified by MRN, date of birth, ID band Patient awake    Reviewed: Allergy & Precautions, H&P , Patient's Chart, lab work & pertinent test results, reviewed documented beta blocker date and time   Airway Mallampati: II  TM Distance: >3 FB Neck ROM: full    Dental no notable dental hx.    Pulmonary asthma , former smoker,    Pulmonary exam normal breath sounds clear to auscultation       Cardiovascular hypertension,  Rhythm:regular Rate:Normal     Neuro/Psych    GI/Hepatic   Endo/Other    Renal/GU      Musculoskeletal   Abdominal   Peds  Hematology   Anesthesia Other Findings   Reproductive/Obstetrics                            Anesthesia Physical Anesthesia Plan  ASA: II  Anesthesia Plan: Epidural and General   Post-op Pain Management:    Induction: Intravenous  PONV Risk Score and Plan: 2 and Ondansetron, Dexamethasone and Scopolamine patch - Pre-op  Airway Management Planned: Oral ETT  Additional Equipment:   Intra-op Plan:   Post-operative Plan: Extubation in OR  Informed Consent: I have reviewed the patients History and Physical, chart, labs and discussed the procedure including the risks, benefits and alternatives for the proposed anesthesia with the patient or authorized representative who has indicated his/her understanding and acceptance.   Dental Advisory Given  Plan Discussed with: CRNA and Surgeon  Anesthesia Plan Comments: (Labs checked- platelets confirmed with RN in room. Fetal heart tracing, per RN, reported to be stable enough for sitting procedure. Discussed epidural, and patient consents to the procedure:  included risk of possible headache,backache, failed block, allergic reaction, and nerve injury. This patient was asked if she had any questions or concerns before the procedure started.)        Anesthesia Quick  Evaluation

## 2016-12-12 ENCOUNTER — Encounter (HOSPITAL_COMMUNITY): Payer: Self-pay | Admitting: Obstetrics and Gynecology

## 2016-12-12 DIAGNOSIS — N920 Excessive and frequent menstruation with regular cycle: Secondary | ICD-10-CM

## 2016-12-12 DIAGNOSIS — D259 Leiomyoma of uterus, unspecified: Secondary | ICD-10-CM

## 2016-12-12 LAB — BASIC METABOLIC PANEL
Anion gap: 6 (ref 5–15)
BUN: 18 mg/dL (ref 6–20)
CALCIUM: 8.8 mg/dL — AB (ref 8.9–10.3)
CO2: 27 mmol/L (ref 22–32)
Chloride: 104 mmol/L (ref 101–111)
Creatinine, Ser: 1.35 mg/dL — ABNORMAL HIGH (ref 0.44–1.00)
GFR calc Af Amer: 51 mL/min — ABNORMAL LOW (ref >60)
GFR, EST NON AFRICAN AMERICAN: 44 mL/min — AB (ref >60)
Glucose, Bld: 111 mg/dL — ABNORMAL HIGH (ref 65–99)
POTASSIUM: 4 mmol/L (ref 3.5–5.1)
SODIUM: 137 mmol/L (ref 135–145)

## 2016-12-12 LAB — CBC
HEMATOCRIT: 33.3 % — AB (ref 36.0–46.0)
HEMOGLOBIN: 11.2 g/dL — AB (ref 12.0–15.0)
MCH: 32.6 pg (ref 26.0–34.0)
MCHC: 33.6 g/dL (ref 30.0–36.0)
MCV: 96.8 fL (ref 78.0–100.0)
Platelets: 317 10*3/uL (ref 150–400)
RBC: 3.44 MIL/uL — AB (ref 3.87–5.11)
RDW: 14.1 % (ref 11.5–15.5)
WBC: 15 10*3/uL — AB (ref 4.0–10.5)

## 2016-12-12 MED ORDER — OXYCODONE-ACETAMINOPHEN 5-325 MG PO TABS: 1.0000 | ORAL_TABLET | ORAL | 0 refills | Status: DC | PRN

## 2016-12-12 MED ORDER — IBUPROFEN 600 MG PO TABS
600.0000 mg | ORAL_TABLET | Freq: Four times a day (QID) | ORAL | 0 refills | Status: DC | PRN
Start: 2016-12-12 — End: 2017-01-21

## 2016-12-12 MED ORDER — BISACODYL 10 MG RE SUPP
10.0000 mg | Freq: Once | RECTAL | Status: AC
  Administered 2016-12-12: 10 mg via RECTAL
  Filled 2016-12-12: qty 1

## 2016-12-12 NOTE — Op Note (Signed)
Jean [name] PROCEDURE DATE: 12/11/2016  PREOPERATIVE DIAGNOSIS:  Symptomatic fibroids, menorrhagia POSTOPERATIVE DIAGNOSIS:  Symptomatic fibroids, menorrhagia SURGEON:   Chancy Milroy M.D., FACOG ASSISTANT: Aletha Halim, M.D. OPERATION:  Total Vaginal hysterectomy with right  salpingectomy ANESTHESIA:  General endotracheal.  INDICATIONS: The patient is a 51 y.o. No obstetric history on file. with history of symptomatic uterine fibroids/menorrhagia. The patient made a decision to undergo definite surgical treatment. On the preoperative visit, the risks, benefits, indications, and alternatives of the procedure were reviewed with the patient.  On the day of surgery, the risks of surgery were again discussed with the patient including but not limited to: bleeding which may require transfusion or reoperation; infection which may require antibiotics; injury to bowel, bladder, ureters or other surrounding organs; need for additional procedures; thromboembolic phenomenon, incisional problems and other postoperative/anesthesia complications. Written informed consent was obtained.    OPERATIVE FINDINGS: A 8 week size uterus with uterine fibroids one pandiculated in the board ligament. , normal bilateral ovaries, absent left tube ( prior left salpingectomy) normal appearing right tube.  ESTIMATED BLOOD LOSS: 100 ml FLUIDS:  As recorded URINE OUTPUT:  100 ml of clear yellow urine. SPECIMENS:  Uterus and cervix and right tube sent to pathology COMPLICATIONS:  None immediate.  DESCRIPTION OF PROCEDURE:  The patient received intravenous antibiotics and had sequential compression devices applied to her lower extremities while in the preoperative area.  She was then taken to the operating room where general anesthesia was administered and was found to be adequate.  She was placed in the dorsal lithotomy position, and was prepped and draped in a sterile manner.  The patients bladder was drained with a  cathter and clamped. After an adequate timeout was performed, attention was turned to her pelvis.  A weighted speculum was then placed in the vagina, and the anterior and posterior lips of the cervix were grasped bilaterally with tenaculums.  The posterior cul-de-sac was entered sharply without difficulty.  A long weighted speculum was inserted into the posterior cul-de-sac. The anterior cervix was circumscribed sharply. Entry was gained anteriorly sharply and verified by presence of bowel. Deaver was replaced.  Zepelin clamps were then used to clamp the uterosacral ligaments on either side.  They were then cut and sutured ligated with 0 Vicryl.   The cardinal ligaments were then clamped, cut and ligated in a similar fashion to just past the uterine artery. At which time morcellation was undertake using a bivalve technique. The final pedical involving the IP was clamped and cut. The uterus was removed. The IP was secured with a 0 Vicryl in a free tie fashion and in a [place] fashion. A pandiculated fibroid in the broad  ligament with a thin base was noted. The base was cross clamped with a Zepelin and then secured with 0 Vicryl.  Hemostasis was noted. The right tube was grasped and clamped, cut and removed. Secured with free tie x 2 Hemostasis was noted. All pedicles were reviewed and interrupted 0 Vicryl sitches were placed to secure hemostasis.  The anterior peritoneum was the closed in a purse sting fashion. Vaginal cuff was closed with 2/0 Vicryl.   All instruments were then removed from the pelvis.  The patient tolerated the procedure well.  All instruments, needles, and sponge counts were correct x 2. The patient was taken to the recovery room in stable condition with IV fluids running and clear urine noted in the foley bag.

## 2016-12-12 NOTE — Discharge Summary (Signed)
Physician Discharge Summary  Patient ID: Jean [name] MRN: [id number] DOB/AGE: December 11, 1965 51 y.o.  Admit date: 12/11/2016 Discharge date: 12/12/2016  Admission Diagnoses: Heavy cycles and uterine fibroids  Discharge Diagnoses:  Active Problems:   Post-operative state   Discharged Condition: good  Hospital Course: Pt was admitted with above Dx and underwent TVH and right salpingectomy. See OP note for additional information. Pt's post op course was unremarkable. Progressed to ambulating, voiding, + flatus, tolerating diet and oral pain control. [place] for discharge home on POD # 1.  Consults: None  Significant Diagnostic Studies: none  Treatments: surgery: TVH/RS  Discharge Exam: Blood pressure (!) 94/57, pulse 61, temperature 98.7 F (37.1 C), temperature source Oral, resp. rate 16, height 5\' 3"  (1.6 m), weight 138 lb (62.6 kg), last menstrual period 12/08/2016, SpO2 99 %.  Lungs clear Heart RRR Abd soft + BS GU no bleeding  Ext non tender  Disposition: 01-Home or Self Care  Discharge Instructions    Call MD for:  difficulty breathing, headache or visual disturbances    Complete by:  As directed    Call MD for:  extreme fatigue    Complete by:  As directed    Call MD for:  persistant dizziness or light-headedness    Complete by:  As directed    Call MD for:  persistant nausea and vomiting    Complete by:  As directed    Call MD for:  redness, tenderness, or signs of infection (pain, swelling, redness, odor or green/yellow discharge around incision site)    Complete by:  As directed    Call MD for:  severe uncontrolled pain    Complete by:  As directed    Call MD for:  temperature >100.4    Complete by:  As directed    Diet - low sodium heart healthy    Complete by:  As directed    Increase activity slowly    Complete by:  As directed      Allergies as of 12/12/2016   No Known Allergies     Medication List    TAKE these medications   albuterol 108 (90  Base) MCG/ACT inhaler Commonly known as:  PROVENTIL HFA;VENTOLIN HFA Inhale 1-2 puffs into the lungs every 6 (six) hours as needed for wheezing or shortness of breath. What changed:  how much to take   amLODipine 10 MG tablet Commonly known as:  NORVASC Take 1 tablet (10 mg total) by mouth daily.   aspirin 325 MG EC tablet Take 1 tablet (325 mg total) by mouth daily.   cyclobenzaprine 10 MG tablet Commonly known as:  FLEXERIL Take 0.5 tablets (5 mg total) by mouth 3 (three) times daily as needed for muscle spasms.   gabapentin 100 MG capsule Commonly known as:  NEURONTIN TAKE 1 Capsule BY MOUTH 3 TIMES DAILY   hydrALAZINE 25 MG tablet Commonly known as:  APRESOLINE Take 1 tablet (25 mg total) by mouth 2 (two) times daily.   hydrochlorothiazide 25 MG tablet Commonly known as:  HYDRODIURIL Take 1 tablet (25 mg total) by mouth daily.   ibuprofen 600 MG tablet Commonly known as:  ADVIL,MOTRIN Take 1 tablet (600 mg total) by mouth every 6 (six) hours as needed.   NICODERM CQ 7 mg/24hr patch Generic drug:  nicotine Place 7 mg onto the skin daily.   nicotine polacrilex 2 MG gum Commonly known as:  NICORETTE Take 2 mg by mouth as needed for smoking cessation.   oxyCODONE-acetaminophen  5-325 MG tablet Commonly known as:  PERCOCET/ROXICET Take 1-2 tablets by mouth every 4 (four) hours as needed (moderate to severe pain (when tolerating fluids)).   ranitidine 150 MG tablet Commonly known as:  ZANTAC Take 150 mg by mouth 2 (two) times daily.   sertraline 50 MG tablet Commonly known as:  ZOLOFT Take 2 tablets (100 mg total) by mouth daily. What changed:  how much to take   tetrahydrozoline 0.05 % ophthalmic solution Place 1 drop into both eyes as needed (dry eyes).      Follow-up Information    [facility]. Schedule an appointment as soon as possible for a visit in 4 week(s).   Why:  Post op appt with Dr. Gardiner Fanti Contact information: [place] [phone]          Signed: Chancy Milroy 12/12/2016, 10:02 AM

## 2016-12-12 NOTE — Discharge Instructions (Signed)
Vaginal Hysterectomy, Care After °Refer to this sheet in the next few weeks. These instructions provide you with information about caring for yourself after your procedure. Your health care provider may also give you more specific instructions. Your treatment has been planned according to current medical practices, but problems sometimes occur. Call your health care provider if you have any problems or questions after your procedure. °What can I expect after the procedure? °After the procedure, it is common to have: °· Pain. °· Soreness and numbness in your incision areas. °· Vaginal bleeding and discharge. °· Constipation. °· Temporary problems emptying the bladder. °· Feelings of sadness or other emotions. ° °Follow these instructions at home: °Medicines °· Take over-the-counter and prescription medicines only as told by your health care provider. °· If you were prescribed an antibiotic medicine, take it as told by your health care provider. Do not stop taking the antibiotic even if you start to feel better. °· Do not drive or operate heavy machinery while taking prescription pain medicine. °Activity °· Return to your normal activities as told by your health care provider. Ask your health care provider what activities are safe for you. °· Get regular exercise as told by your health care provider. You may be told to take short walks every day and go farther each time. °· Do not lift anything that is heavier than 10 lb (4.5 kg). °General instructions ° °· Do not put anything in your vagina for 6 weeks after your surgery or as told by your health care provider. This includes tampons and douches. °· Do not have sex until your health care provider says you can. °· Do not take baths, swim, or use a hot tub until your health care provider approves. °· Drink enough fluid to keep your urine clear or pale yellow. °· Do not drive for 24 hours if you were given a sedative. °· Keep all follow-up visits as told by your health  care provider. This is important. °Contact a health care provider if: °· Your pain medicine is not helping. °· You have a fever. °· You have redness, swelling, or pain at your incision site. °· You have blood, pus, or a bad-smelling discharge from your vagina. °· You continue to have difficulty urinating. °Get help right away if: °· You have severe abdominal or back pain. °· You have heavy bleeding from your vagina. °· You have chest pain or shortness of breath. °This information is not intended to replace advice given to you by your health care provider. Make sure you discuss any questions you have with your health care provider. °Document Released: 08/15/2015 Document Revised: 09/29/2015 Document Reviewed: 05/08/2015 °Elsevier Interactive Patient Education © 2018 [employer] ° °

## 2016-12-12 NOTE — Addendum Note (Signed)
Addendum  created 12/12/16 0900 by Hewitt Blade, CRNA   Sign clinical note

## 2016-12-12 NOTE — Plan of Care (Signed)
Problem: Tissue Perfusion: Goal: Risk factors for ineffective tissue perfusion will decrease Outcome: Progressing Patient wearing SCDs when not ambulating.   Problem: Nutrition: Goal: Adequate nutrition will be maintained Outcome: Progressing Patient adequately maintaining a regular diet.   Problem: Bowel/Gastric: Goal: Will not experience complications related to bowel motility Outcome: Progressing Bowel sounds active. Patient ambulating in hall to decrease gas pain and increase bowel motility.    Problem: Bowel/Gastric: Goal: Gastrointestinal status for postoperative course will improve Outcome: Progressing Bowel sounds active.  PRN Suppository given per patient request, approved by Dr. Rip Harbour.    Problem: Urinary Elimination: Goal: Ability to reestablish a normal urinary elimination pattern will improve Outcome: Progressing Foley d/c at 0700 12/12/16.  Patient stating she is having trouble voiding.  Pt ambulating and drinking juice and sprite to initiate flow.  RN continuing to monitor.

## 2016-12-12 NOTE — Anesthesia Postprocedure Evaluation (Signed)
Anesthesia Post Note  Patient: BUELAH RENNIE  Procedure(s) Performed: Procedure(s) (LRB): HYSTERECTOMY VAGINAL WITH RIGHT SALPINGECTOMY (Right)     Patient location during evaluation: [facility] Anesthesia Type: General Level of consciousness: awake and alert and oriented Pain management: pain level controlled Vital Signs Assessment: post-procedure vital signs reviewed and stable Respiratory status: spontaneous breathing and nonlabored ventilation Cardiovascular status: stable Postop Assessment: adequate PO intake and no signs of nausea or vomiting Anesthetic complications: no    Last Vitals:  Vitals:   12/12/16 0433 12/12/16 0746  BP: 115/62 (!) 94/57  Pulse: 67 61  Resp: 16 16  Temp: 37.2 C 37.1 C    Last Pain:  Vitals:   12/12/16 0746  TempSrc: Oral  PainSc:    Pain Goal: Patients Stated Pain Goal: 3 (12/11/16 2237)               Jabier Mutton

## 2016-12-12 NOTE — Progress Notes (Signed)
Discharge instructions discussed with patient.  Patient verbalizes understanding of medications, post op care and follow up appointments.

## 2016-12-13 ENCOUNTER — Other Ambulatory Visit: Payer: Self-pay | Admitting: Pharmacist

## 2016-12-13 ENCOUNTER — Encounter (HOSPITAL_COMMUNITY): Payer: Self-pay

## 2016-12-13 ENCOUNTER — Ambulatory Visit: Payer: Medicaid Other

## 2016-12-13 DIAGNOSIS — I10 Essential (primary) hypertension: Secondary | ICD-10-CM

## 2016-12-13 DIAGNOSIS — R0609 Other forms of dyspnea: Secondary | ICD-10-CM

## 2016-12-13 MED FILL — OXYCODONE W/APAP 5/325 TAB: 5-325 | 3 days supply | Qty: 30 | Fill #0

## 2016-12-14 MED ORDER — AMLODIPINE BESYLATE 10 MG PO TABS: 10.0000 mg | ORAL_TABLET | Freq: Every day | ORAL | 2 refills | Status: DC

## 2016-12-14 MED ORDER — ALBUTEROL SULFATE HFA 108 (90 BASE) MCG/ACT IN AERS: 2.0000 | INHALATION_SPRAY | Freq: Four times a day (QID) | RESPIRATORY_TRACT | 3 refills | Status: DC | PRN

## 2016-12-17 ENCOUNTER — Inpatient Hospital Stay (HOSPITAL_COMMUNITY): Payer: Self-pay

## 2016-12-17 ENCOUNTER — Inpatient Hospital Stay (HOSPITAL_COMMUNITY)
Admission: AD | Admit: 2016-12-17 | Discharge: 2016-12-17 | Disposition: A | Discharge status: Home / Self Care | Payer: Self-pay | Source: Ambulatory Visit | Attending: Obstetrics & Gynecology | Admitting: Obstetrics & Gynecology

## 2016-12-17 ENCOUNTER — Encounter (HOSPITAL_COMMUNITY): Payer: Self-pay | Admitting: *Deleted

## 2016-12-17 DIAGNOSIS — R109 Unspecified abdominal pain: Secondary | ICD-10-CM | POA: Insufficient documentation

## 2016-12-17 DIAGNOSIS — G8918 Other acute postprocedural pain: Secondary | ICD-10-CM

## 2016-12-17 DIAGNOSIS — N9489 Other specified conditions associated with female genital organs and menstrual cycle: Secondary | ICD-10-CM

## 2016-12-17 DIAGNOSIS — Z96642 Presence of left artificial hip joint: Secondary | ICD-10-CM | POA: Insufficient documentation

## 2016-12-17 DIAGNOSIS — D259 Leiomyoma of uterus, unspecified: Secondary | ICD-10-CM | POA: Insufficient documentation

## 2016-12-17 DIAGNOSIS — F329 Major depressive disorder, single episode, unspecified: Secondary | ICD-10-CM | POA: Insufficient documentation

## 2016-12-17 DIAGNOSIS — M549 Dorsalgia, unspecified: Secondary | ICD-10-CM | POA: Insufficient documentation

## 2016-12-17 DIAGNOSIS — I10 Essential (primary) hypertension: Secondary | ICD-10-CM | POA: Insufficient documentation

## 2016-12-17 DIAGNOSIS — J45909 Unspecified asthma, uncomplicated: Secondary | ICD-10-CM | POA: Insufficient documentation

## 2016-12-17 DIAGNOSIS — Z79899 Other long term (current) drug therapy: Secondary | ICD-10-CM | POA: Insufficient documentation

## 2016-12-17 DIAGNOSIS — K219 Gastro-esophageal reflux disease without esophagitis: Secondary | ICD-10-CM | POA: Insufficient documentation

## 2016-12-17 DIAGNOSIS — Z87891 Personal history of nicotine dependence: Secondary | ICD-10-CM | POA: Insufficient documentation

## 2016-12-17 DIAGNOSIS — G5600 Carpal tunnel syndrome, unspecified upper limb: Secondary | ICD-10-CM | POA: Insufficient documentation

## 2016-12-17 LAB — URINALYSIS, ROUTINE W REFLEX MICROSCOPIC
BILIRUBIN URINE: NEGATIVE
GLUCOSE, UA: NEGATIVE mg/dL
Ketones, ur: NEGATIVE mg/dL
NITRITE: NEGATIVE
PROTEIN: NEGATIVE mg/dL
Specific Gravity, Urine: 1.024 (ref 1.005–1.030)
pH: 5 (ref 5.0–8.0)

## 2016-12-17 LAB — COMPREHENSIVE METABOLIC PANEL
ALBUMIN: 3.8 g/dL (ref 3.5–5.0)
ALT: 23 U/L (ref 14–54)
ANION GAP: 8 (ref 5–15)
AST: 23 U/L (ref 15–41)
Alkaline Phosphatase: 72 U/L (ref 38–126)
BILIRUBIN TOTAL: 0.6 mg/dL (ref 0.3–1.2)
BUN: 19 mg/dL (ref 6–20)
CHLORIDE: 103 mmol/L (ref 101–111)
CO2: 27 mmol/L (ref 22–32)
Calcium: 9.5 mg/dL (ref 8.9–10.3)
Creatinine, Ser: 1.22 mg/dL — ABNORMAL HIGH (ref 0.44–1.00)
GFR calc Af Amer: 58 mL/min — ABNORMAL LOW (ref >60)
GFR, EST NON AFRICAN AMERICAN: 50 mL/min — AB (ref >60)
Glucose, Bld: 85 mg/dL (ref 65–99)
POTASSIUM: 4.4 mmol/L (ref 3.5–5.1)
Sodium: 138 mmol/L (ref 135–145)
TOTAL PROTEIN: 8.7 g/dL — AB (ref 6.5–8.1)

## 2016-12-17 LAB — RAPID URINE DRUG SCREEN, HOSP PERFORMED
AMPHETAMINES: NOT DETECTED
BENZODIAZEPINES: NOT DETECTED
Barbiturates: NOT DETECTED
Cocaine: POSITIVE — AB
OPIATES: NOT DETECTED
TETRAHYDROCANNABINOL: POSITIVE — AB

## 2016-12-17 LAB — CBC
HCT: 36 % (ref 36.0–46.0)
Hemoglobin: 12.1 g/dL (ref 12.0–15.0)
MCH: 32.7 pg (ref 26.0–34.0)
MCHC: 33.6 g/dL (ref 30.0–36.0)
MCV: 97.3 fL (ref 78.0–100.0)
PLATELETS: 393 10*3/uL (ref 150–400)
RBC: 3.7 MIL/uL — ABNORMAL LOW (ref 3.87–5.11)
RDW: 13.8 % (ref 11.5–15.5)
WBC: 8.2 10*3/uL (ref 4.0–10.5)

## 2016-12-17 MED ORDER — HYDROMORPHONE HCL 1 MG/ML IJ SOLN
2.0000 mg | Freq: Once | INTRAMUSCULAR | Status: AC
  Administered 2016-12-17: 2 mg via INTRAVENOUS
  Filled 2016-12-17: qty 2

## 2016-12-17 MED ORDER — LACTATED RINGERS IV BOLUS (SEPSIS)
1000.0000 mL | Freq: Once | INTRAVENOUS | Status: AC
  Administered 2016-12-17: 1000 mL via INTRAVENOUS

## 2016-12-17 MED ORDER — IOPAMIDOL (ISOVUE-300) INJECTION 61%
30.0000 mL | Freq: Once | INTRAVENOUS | Status: AC | PRN
Start: 2016-12-17 — End: 2016-12-17
  Administered 2016-12-17: 30 mL via ORAL

## 2016-12-17 MED ORDER — IOPAMIDOL (ISOVUE-300) INJECTION 61%
100.0000 mL | Freq: Once | INTRAVENOUS | Status: AC | PRN
  Administered 2016-12-17: 100 mL via INTRAVENOUS

## 2016-12-17 NOTE — MAU Provider Note (Signed)
History     CSN: [id number]  Arrival date and time: 12/17/16 1041   None     Chief Complaint  Patient presents with  . Abdominal Pain  . Back Pain   Dr. Purvis Kilts saw the patient and has obtained the history.   Jean [name] is a 51 y.o. [id number] who is SP TVH and right salpingectomy on 12/12/16. She was DC home with 30 percocet. She reports that she has taken all of this medication, and she is continuing to have pain.   Pt reports that she never had pain relief after the surgery. She reports that the severe pain began on Sat am (2 days prev). She denies bleeding or vaginal discharge or odor.  She Denis fever or chills.  She reports that she is passing gas and passing her urine without difficulty. She reports that she ate yesterday without difficulty. She has taken 30  percocet since discharge 4 days prev.       Past Medical History:  Diagnosis Date  . Arthritis    hip  . Asthma   . Bronchitis    states at least 2-3 months ago  . Carpal tunnel syndrome    bilateral  . Chronic lower back pain   . Depression   . Dyspnea    with exertion  . GERD (gastroesophageal reflux disease)   . Headache    migraines  . Herpes   . Hypertension   . SVD (spontaneous vaginal delivery)    x 2  . Uterine leiomyoma 02/16/2016    Past Surgical History:  Procedure Laterality Date  . SALPINGECTOMY     lap - ectopic preg  . TOTAL HIP ARTHROPLASTY Left 01/20/2016   Procedure: LEFT TOTAL HIP ARTHROPLASTY ANTERIOR APPROACH;  Surgeon: Mcarthur Rossetti, MD;  Location: [facility];  Service: Orthopedics;  Laterality: Left;  Marland Kitchen VAGINAL HYSTERECTOMY Right 12/11/2016   Procedure: HYSTERECTOMY VAGINAL WITH RIGHT SALPINGECTOMY;  Surgeon: Chancy Milroy, MD;  Location: [facility];  Service: Gynecology;  Laterality: Right;    Family History  Problem Relation Age of Onset  . Cancer Mother   . Hypertension Mother   . Diabetes Mother   . Stroke Father     Social History  Substance Use Topics  .  Smoking status: Former Smoker    Packs/day: 0.25    Years: 11.00    Types: Cigarettes    Quit date: 11/28/2016  . Smokeless tobacco: Never Used  . Alcohol use 1.2 oz/week    2 Cans of beer per week    Allergies: No Known Allergies  Prescriptions Prior to Admission  Medication Sig Dispense Refill Last Dose  . albuterol (PROVENTIL HFA;VENTOLIN HFA) 108 (90 Base) MCG/ACT inhaler Inhale 2 puffs into the lungs every 6 (six) hours as needed for wheezing or shortness of breath. 18 g 3 12/16/2016 at Unknown time  . amLODipine (NORVASC) 10 MG tablet Take 1 tablet (10 mg total) by mouth daily. 30 tablet 2 12/17/2016 at Unknown time  . gabapentin (NEURONTIN) 100 MG capsule TAKE 1 Capsule BY MOUTH 3 TIMES DAILY 90 capsule 2 12/16/2016 at Unknown time  . hydrochlorothiazide (HYDRODIURIL) 25 MG tablet Take 1 tablet (25 mg total) by mouth daily. 90 tablet 2 12/17/2016 at Unknown time  . nicotine (NICODERM CQ) 7 mg/24hr patch Place 7 mg onto the skin daily.   Past Week at Unknown time  . oxyCODONE-acetaminophen (PERCOCET/ROXICET) 5-325 MG tablet Take 1-2 tablets by mouth every 4 (four) hours as needed (moderate  to severe pain (when tolerating fluids)). 30 tablet 0 12/16/2016 at Unknown time  . ranitidine (ZANTAC) 150 MG tablet Take 150 mg by mouth 2 (two) times daily.   Past Week at Unknown time  . sertraline (ZOLOFT) 50 MG tablet Take 2 tablets (100 mg total) by mouth daily. (Patient taking differently: Take 50 mg by mouth daily. ) 180 tablet 3 Past Week at Unknown time  . cyclobenzaprine (FLEXERIL) 10 MG tablet Take 0.5 tablets (5 mg total) by mouth 3 (three) times daily as needed for muscle spasms. 60 tablet 0 Past Week at Unknown time  . hydrALAZINE (APRESOLINE) 25 MG tablet Take 1 tablet (25 mg total) by mouth 2 (two) times daily. (Patient not taking: Reported on 12/17/2016) 60 tablet 6 Not Taking at Unknown time  . ibuprofen (ADVIL,MOTRIN) 600 MG tablet Take 1 tablet (600 mg total) by mouth every 6 (six)  hours as needed. (Patient not taking: Reported on 12/17/2016) 30 tablet 0 Not Taking at Unknown time    Review of Systems  Constitutional: Negative for chills and fever.  Gastrointestinal: Positive for nausea and vomiting.  Genitourinary: Positive for pelvic pain. Negative for dysuria, frequency, urgency, vaginal bleeding and vaginal discharge.   Physical Exam   Blood pressure 127/76, pulse 79, temperature 97.6 F (36.4 C), temperature source Oral, resp. rate 18, last menstrual period 12/08/2016.  Physical Exam Pt in NAD.   CONSTITUTIONAL: Well-developed, well-nourished female in no acute distress. Pt appears to be writhing around in pain and unable to sit still  HENT:  Normocephalic, atraumatic EYES: Conjunctivae and EOM are normal. No scleral icterus.  NECK: Normal range of motion SKIN: Skin is warm and dry. No rash noted. Not diaphoretic.No pallor. [place]: Alert and oriented to person, place, and time. Normal coordination.  Lungs: CTA CV: RRR Abd:  Soft, NT, ND. +tenderness with palpation but, no tenderness when pressing deeply with the stethoscope.  Pt does report more pain on the left side. GU: pelvic exam not done    Results for orders placed or performed during the hospital encounter of 12/17/16 (from the past 24 hour(s))  Urinalysis, Routine w reflex microscopic     Status: Abnormal   Collection Time: 12/17/16 11:02 AM  Result Value Ref Range   Color, Urine YELLOW YELLOW   APPearance HAZY (A) CLEAR   Specific Gravity, Urine 1.024 1.005 - 1.030   pH 5.0 5.0 - 8.0   Glucose, UA NEGATIVE NEGATIVE mg/dL   Hgb urine dipstick MODERATE (A) NEGATIVE   Bilirubin Urine NEGATIVE NEGATIVE   Ketones, ur NEGATIVE NEGATIVE mg/dL   Protein, ur NEGATIVE NEGATIVE mg/dL   Nitrite NEGATIVE NEGATIVE   Leukocytes, UA TRACE (A) NEGATIVE   RBC / HPF 0-5 0 - 5 RBC/hpf   WBC, UA 6-30 0 - 5 WBC/hpf   Bacteria, UA RARE (A) NONE SEEN   Squamous Epithelial / LPF 6-30 (A) NONE SEEN    Mucous PRESENT   Urine rapid drug screen (hosp performed)     Status: Abnormal   Collection Time: 12/17/16 11:02 AM  Result Value Ref Range   Opiates NONE DETECTED NONE DETECTED   Cocaine POSITIVE (A) NONE DETECTED   Benzodiazepines NONE DETECTED NONE DETECTED   Amphetamines NONE DETECTED NONE DETECTED   Tetrahydrocannabinol POSITIVE (A) NONE DETECTED   Barbiturates NONE DETECTED NONE DETECTED  CBC     Status: Abnormal   Collection Time: 12/17/16 11:37 AM  Result Value Ref Range   WBC 8.2 4.0 - 10.5 K/uL  RBC 3.70 (L) 3.87 - 5.11 MIL/uL   Hemoglobin 12.1 12.0 - 15.0 g/dL   HCT 36.0 36.0 - 46.0 %   MCV 97.3 78.0 - 100.0 fL   MCH 32.7 26.0 - 34.0 pg   MCHC 33.6 30.0 - 36.0 g/dL   RDW 13.8 11.5 - 15.5 %   Platelets 393 150 - 400 K/uL  Comprehensive metabolic panel     Status: Abnormal   Collection Time: 12/17/16 11:37 AM  Result Value Ref Range   Sodium 138 135 - 145 mmol/L   Potassium 4.4 3.5 - 5.1 mmol/L   Chloride 103 101 - 111 mmol/L   CO2 27 22 - 32 mmol/L   Glucose, Bld 85 65 - 99 mg/dL   BUN 19 6 - 20 mg/dL   Creatinine, Ser 1.22 (H) 0.44 - 1.00 mg/dL   Calcium 9.5 8.9 - 10.3 mg/dL   Total Protein 8.7 (H) 6.5 - 8.1 g/dL   Albumin 3.8 3.5 - 5.0 g/dL   AST 23 15 - 41 U/L   ALT 23 14 - 54 U/L   Alkaline Phosphatase 72 38 - 126 U/L   Total Bilirubin 0.6 0.3 - 1.2 mg/dL   GFR calc non Af Amer 50 (L) >60 mL/min   GFR calc Af Amer 58 (L) >60 mL/min   Anion gap 8 5 - 15   Ct Abdomen Pelvis W Contrast  Result Date: 12/17/2016 CLINICAL DATA:  Status post total vaginal hysterectomy 6 days prior for uterine fibroids. Patient presents with lower abdominal pain and distention, right greater than left. EXAM: CT ABDOMEN AND PELVIS WITH CONTRAST TECHNIQUE: Multidetector CT imaging of the abdomen and pelvis was performed using the standard protocol following bolus administration of intravenous contrast. CONTRAST:  197mL ISOVUE-300 IOPAMIDOL (ISOVUE-300) INJECTION 61% COMPARISON:   02/03/2016 pelvic sonogram. FINDINGS: Lower chest: Mild dependent hypoventilatory changes at the lung bases. Hepatobiliary: Normal liver size. Segment 8 right liver lobe 2.0 cm mass (series 2/image 15) is stable in size since 11/30/2015 chest CT and demonstrates discontinuous peripheral nodular enhancement, compatible with a benign hemangioma. Hyperenhancing 0.9 cm mass at the right liver dome (series 2/ image 9) is stable since 11/30/2015 chest CT and compatible with a benign flash filling hemangioma. Hyperenhancing 1.3 cm liver mass in the anterior inferior right liver lobe (series 2/image 30), for which no prior comparison imaging exists. Subcentimeter hypodense liver lesion in the posterior right liver lobe (series 2/ image 21). No additional liver lesions. Normal gallbladder with no radiopaque cholelithiasis. No biliary ductal dilatation. Pancreas: Normal, with no mass or duct dilation. Spleen: Normal size. No mass. Adrenals/Urinary Tract: Normal adrenals. No hydronephrosis . Multifocal moderate left and mild right renal cortical scarring. Scattered subcentimeter hypodense renal cortical lesions in both kidneys are too small to characterize and require no further follow-up. Ureters are normal caliber, with no evidence of ureteral leak on the delayed sequence. Limited bladder visualization due to streak artifact from the left hip hardware, with no gross bladder abnormality. Stomach/Bowel: Probable small hiatal hernia. Otherwise collapsed and grossly normal stomach. Normal caliber small bowel with no small bowel wall thickening. Normal appendix. Minimal sigmoid diverticulosis, with no large bowel wall thickening or significant pericolonic fat stranding. Vascular/Lymphatic: Normal caliber abdominal aorta. Patent portal, splenic, hepatic and renal veins. No pathologically enlarged lymph nodes in the abdomen or pelvis. Reproductive: Status post hysterectomy. There is a 5.4 x 3.9 x 3.6 cm right adnexal mass (series  2/image 69), which demonstrates heterogeneous hypodensity. Simple 1.3 cm right  ovarian cyst. No left adnexal lesions. No fluid collections at the hysterectomy margin. Other: No pneumoperitoneum, ascites or focal fluid collection. Musculoskeletal: No aggressive appearing focal osseous lesions. Partially visualized left total hip arthroplasty. IMPRESSION: 1. Interval hysterectomy. Solid 5.4 x 3.9 x 3.6 cm right adnexal mass with heterogeneous hypodensity. Based on comparison with the preoperative 02/03/2016 pelvic ultrasound study, this may represent a remnant fibroid within the right broad ligament. Consider acute degeneration/infarction of a broad ligament fibroid as a potential source of the patient's pain. Correlation with transabdominal pelvic ultrasound may be useful to assess for vascularity within this mass (the absence of which would support this diagnosis) . 2. No evidence of urinary tract injury. No hydronephrosis. No fluid collections. No free air. 3. No evidence of bowel obstruction or acute bowel inflammation. 4. Hyperenhancing 1.3 cm inferior right liver lobe mass, for which no prior comparison exists, probably a benign lesion such as a flash filling hemangioma. Recommend attention on a follow-up MRI abdomen without and with IV contrast in 3-6 months. This recommendation follows ACR consensus guidelines: Managing Incidental Findings on Abdominal CT: White Paper of the ACR Incidental Findings Committee. J Am Coll Radiol 2010;7:754-773. 5. Bilateral renal cortical scarring, asymmetrically prominent in the left kidney. Electronically Signed   By: Ilona Sorrel M.D.   On: 12/17/2016 15:23   MAU Course  Procedures  MDM 1408: DW Dr. Ihor Dow, if patient's CT scan is normal then she can be DC home. No new rx for percocet to be provided. Patient's UDS is negative for opitates and +cocaine. Concern that she has sold her pain medication to purchase cocaine.  1538: DW Dr. Eugenie Norrie will get [place] assess the  adnexal mass.   Marcille Buffy, CNM  assessed the pt while I was in surgery. The initial eval and post radiology eval and assessment was done by me.  Assessment and Plan  Pt has been observed since early am.  She received Dilaudid 2mg  and has not requested more pain meds. I reassessed her and she is asymptomatic.  I reviewed all of her radiologic findings  rec f/u with Dr. Rip Harbour in 1 week for her post op check NSAIDS prn pain  Bailey Kolbe Harraway-Smith 5:30 PM

## 2016-12-17 NOTE — MAU Note (Signed)
Encouraging pt to drink contrast, but has vomited

## 2016-12-17 NOTE — Discharge Instructions (Signed)
Laparoscopically Assisted Vaginal Hysterectomy, Care After °Refer to this sheet in the next few weeks. These instructions provide you with information on caring for yourself after your procedure. Your health care provider may also give you more specific instructions. Your treatment has been planned according to current medical practices, but problems sometimes occur. Call your health care provider if you have any problems or questions after your procedure. °What can I expect after the procedure? °After your procedure, it is typical to have the following: °· Abdominal pain. You will be given pain medicine to control it. °· Sore throat from the breathing tube that was inserted during surgery. ° °Follow these instructions at home: °· Only take over-the-counter or prescription medicines for pain, discomfort, or fever as directed by your health care provider. °· Do not take aspirin. It can cause bleeding. °· Do not drive when taking pain medicine. °· Follow your health care provider's advice regarding diet, exercise, lifting, driving, and general activities. °· Resume your usual diet as directed and allowed. °· Get plenty of rest and sleep. °· Do not douche, use tampons, or have sexual intercourse for at least 6 weeks, or until your health care provider gives you permission. °· Change your bandages (dressings) as directed by your health care provider. °· Monitor your temperature and notify your health care provider of a fever. °· Take showers instead of baths for 2-3 weeks. °· Do not drink alcohol until your health care provider gives you permission. °· If you develop constipation, you may take a mild laxative with your health care provider's permission. Bran foods may help with constipation problems. Drinking enough fluids to keep your urine clear or pale yellow may help as well. °· Try to have someone home with you for 1-2 weeks to help around the house. °· Keep all of your follow-up appointments as directed by your  health care provider. °Contact a health care provider if: °· You have swelling, redness, or increasing pain around your incision sites. °· You have pus coming from your incision. °· You notice a bad smell coming from your incision. °· Your incision breaks open. °· You feel dizzy or lightheaded. °· You have pain or bleeding when you urinate. °· You have persistent diarrhea. °· You have persistent nausea and vomiting. °· You have abnormal vaginal discharge. °· You have a rash. °· You have any type of abnormal reaction or develop an allergy to your medicine. °· You have poor pain control with your prescribed medicine. °Get help right away if: °· You have a fever. °· You have severe abdominal pain. °· You have chest pain. °· You have shortness of breath. °· You faint. °· You have pain, swelling, or redness in your leg. °· You have heavy vaginal bleeding with blood clots. °This information is not intended to replace advice given to you by your health care provider. Make sure you discuss any questions you have with your health care provider. °Document Released: 04/12/2011 Document Revised: 09/29/2015 Document Reviewed: 11/06/2012 °Elsevier Interactive Patient Education © 2017 [employer] ° °

## 2016-12-17 NOTE — MAU Note (Signed)
Pt C/O sharp lower abd & back pain, worse on R side.  Is having bloating & "gas build up.".  Had hysterectomy on August 7.  Last BM was 2 days ago.  Had fever of 100.4 yesterday, hasn't taken temp today.

## 2016-12-18 ENCOUNTER — Ambulatory Visit: Payer: Self-pay

## 2016-12-21 ENCOUNTER — Ambulatory Visit (INDEPENDENT_AMBULATORY_CARE_PROVIDER_SITE_OTHER): Payer: Self-pay | Admitting: Family Medicine

## 2016-12-21 VITALS — BP 140/86 | Ht 63.0 in | Wt 141.0 lb

## 2016-12-21 DIAGNOSIS — R269 Unspecified abnormalities of gait and mobility: Secondary | ICD-10-CM

## 2016-12-21 NOTE — Progress Notes (Signed)
   Subjective:    Jean [name] - 51 y.o. female MRN [id number]  Date of birth: [birth year]  CC: bilateral knee pain    HPI  Jean [name] is here for bilateral knee pain for one year. Reports that she had left hip replacement about a year ago and has had a lot of knee pain since. Knee pain is anterior in location bilaterally and diffuse. Any type of walking or standing makes pain worse especially with going up and down steps. She reports weakness of lower extremities bilaterally. She ambulates with assistance of cane. She is requesting referral for PT today.    - Review of Systems: Per HPI.   reports that she quit smoking about 3 weeks ago. Her smoking use included Cigarettes. She has a 2.75 pack-year smoking history. She has never used smokeless tobacco. - Past Medical History: Patient Active Problem List   Diagnosis Date Noted  . Post-operative state 12/11/2016  . CKD (chronic kidney disease) 10/29/2016  . Chest wall pain 10/15/2016  . Menorrhagia 08/27/2016  . Chronic pain syndrome 05/10/2016  . Uterine leiomyoma 02/16/2016  . Liver lesion 12/01/2015  . Situational depression 10/19/2015  . HTN (hypertension) 07/08/2015  . Routine health maintenance 07/08/2015  . Tobacco abuse 07/08/2015      Objective:   Physical Exam Blood pressure 140/86, height 5\' 3"  (1.6 m), weight 141 lb (64 kg), last menstrual period 12/08/2016. Gen: NAD, alert, cooperative with exam, well-appearing Knees: No edema, erythema or increased warmth appreciated. No medial or lateral joint line tenderness bilaterally. ROM intact at knees. Ligaments stable with solid endpoints. Negative McMurray's test.  Hips: Weakness of flexors and abductors bilaterally left > right.  Gait: Ambulates with cane. Pelvic drop to the left with steps.     Assessment & Plan:    Bilateral Knee Pain: Suspect compensatory due to abnormality of gait from left hip replacement 1 year prior. Likely has component of OA as well  given her history. Will refer to PT for gait evaluation and strengthening of hip muscles. Return prn.   Phill Myron, D.O. 12/21/2016, 9:47 AM PGY-3, [place]

## 2016-12-21 NOTE — Progress Notes (Signed)
[facility] Attending Note: I have seen and examined this patient. I have discussed this patient with the resident and reviewed the assessment and plan as documented above. I agree with the resident's findings and plan. Very antalgic gait. Needs some rehab. Will set up Pt and focus on gait.

## 2016-12-28 ENCOUNTER — Telehealth: Payer: Self-pay | Admitting: *Deleted

## 2016-12-28 ENCOUNTER — Ambulatory Visit: Payer: Self-pay | Admitting: Obstetrics and Gynecology

## 2016-12-28 NOTE — Telephone Encounter (Signed)
Patient no showed for her appointment today. Per Dr Rip Harbour, she needs follow up. Attempted to contact patient. Voice mail left stating I am calling to reschedule, please call the office.

## 2016-12-31 ENCOUNTER — Ambulatory Visit (INDEPENDENT_AMBULATORY_CARE_PROVIDER_SITE_OTHER): Payer: Self-pay | Admitting: Physician Assistant

## 2017-01-21 ENCOUNTER — Ambulatory Visit (INDEPENDENT_AMBULATORY_CARE_PROVIDER_SITE_OTHER): Payer: Self-pay | Admitting: Internal Medicine

## 2017-01-21 VITALS — BP 143/101 | HR 67 | Temp 97.9°F | Ht 63.0 in | Wt 139.9 lb

## 2017-01-21 DIAGNOSIS — I10 Essential (primary) hypertension: Secondary | ICD-10-CM

## 2017-01-21 DIAGNOSIS — Z0000 Encounter for general adult medical examination without abnormal findings: Secondary | ICD-10-CM

## 2017-01-21 DIAGNOSIS — Z9079 Acquired absence of other genital organ(s): Secondary | ICD-10-CM

## 2017-01-21 DIAGNOSIS — F4321 Adjustment disorder with depressed mood: Secondary | ICD-10-CM

## 2017-01-21 DIAGNOSIS — F1490 Cocaine use, unspecified, uncomplicated: Secondary | ICD-10-CM

## 2017-01-21 DIAGNOSIS — Z79899 Other long term (current) drug therapy: Secondary | ICD-10-CM

## 2017-01-21 DIAGNOSIS — M1610 Unilateral primary osteoarthritis, unspecified hip: Secondary | ICD-10-CM

## 2017-01-21 DIAGNOSIS — M5136 Other intervertebral disc degeneration, lumbar region: Secondary | ICD-10-CM

## 2017-01-21 DIAGNOSIS — R269 Unspecified abnormalities of gait and mobility: Secondary | ICD-10-CM

## 2017-01-21 DIAGNOSIS — G894 Chronic pain syndrome: Secondary | ICD-10-CM

## 2017-01-21 DIAGNOSIS — K219 Gastro-esophageal reflux disease without esophagitis: Secondary | ICD-10-CM

## 2017-01-21 DIAGNOSIS — M5134 Other intervertebral disc degeneration, thoracic region: Secondary | ICD-10-CM

## 2017-01-21 DIAGNOSIS — Z8742 Personal history of other diseases of the female genital tract: Secondary | ICD-10-CM

## 2017-01-21 DIAGNOSIS — Z9114 Patient's other noncompliance with medication regimen: Secondary | ICD-10-CM

## 2017-01-21 DIAGNOSIS — I129 Hypertensive chronic kidney disease with stage 1 through stage 4 chronic kidney disease, or unspecified chronic kidney disease: Secondary | ICD-10-CM

## 2017-01-21 DIAGNOSIS — N189 Chronic kidney disease, unspecified: Secondary | ICD-10-CM

## 2017-01-21 DIAGNOSIS — Z90710 Acquired absence of both cervix and uterus: Secondary | ICD-10-CM

## 2017-01-21 MED ORDER — RANITIDINE HCL 150 MG PO TABS: 150.0000 mg | ORAL_TABLET | Freq: Two times a day (BID) | ORAL | 2 refills | Status: DC

## 2017-01-21 MED ORDER — CYCLOBENZAPRINE HCL 10 MG PO TABS: 5.0000 mg | ORAL_TABLET | Freq: Two times a day (BID) | ORAL | 0 refills | Status: DC | PRN

## 2017-01-21 MED ORDER — HYDROCHLOROTHIAZIDE 25 MG PO TABS: 25.0000 mg | ORAL_TABLET | Freq: Every day | ORAL | 2 refills | Status: DC

## 2017-01-21 MED ORDER — GABAPENTIN 100 MG PO CAPS: 100.0000 mg | ORAL_CAPSULE | Freq: Three times a day (TID) | ORAL | 2 refills | Status: DC

## 2017-01-21 MED ORDER — IBUPROFEN 600 MG PO TABS: 600.0000 mg | ORAL_TABLET | Freq: Four times a day (QID) | ORAL | 1 refills | Status: DC | PRN

## 2017-01-21 MED ORDER — SERTRALINE HCL 50 MG PO TABS: 50.0000 mg | ORAL_TABLET | Freq: Every day | ORAL | Status: DC

## 2017-01-21 NOTE — Assessment & Plan Note (Signed)
Patient's PHQ 9= 6 today, suggesting mild depression. Patient reports worsening of her mood symptoms since self-discontinuation of sertraline 100 mg daily. Patient reports use of cocaine to help with mood symptoms, with most recent use 3 weeks ago. The patient was instructed that her medication can help with mood symptoms so that she doesn't have to use cocaine to self-medicate. We discussed the importance of taking medications as prescribed and that it takes time for these medications to work. We also discussed her home support system, which includes the family she lives with, to help her abstain from cocaine use. She states that she doesn't feel the urge to use drugs when her mood symptoms are not so severe. She was instructed to use medications as prescribed and to follow up in clinic in 4-8 weeks to reevaluate her mood symptoms and drug use.  Plan: -Restart sertraline 50 mg daily -Follow up in 4-8 weeks for reevaluation of mood symptoms

## 2017-01-21 NOTE — Assessment & Plan Note (Signed)
Patient's BP at goal on initial reading, but elevated on subsequent readings while obtaining orthostatic vital signs. Patient currently only taking 12.5mg  HCTZ daily for BP management. Patient was instructed to start taking HCTZ 25 mg daily as previously prescribed. Patient was told to stop taking amlodipine 10 mg given that she experienced lip swelling while taking this medication 1 month ago. Patient also told to hold off on taking hydralazine 25 mg BID, since she hasn't started taking this medication at all. Stressed the importance of taking her single blood pressure medication as prescribed each day. Patient amenable to taking this medication as prescribed.  Plan: -Restart taking HCTZ 25 mg daily -RTC 4-8 weeks for BP recheck

## 2017-01-21 NOTE — Progress Notes (Signed)
CC: HTN and depression follow up  HPI:  Jean [name] is a 51 y.o. with PMH of HTN, GERD, CKD, and depression who is presenting today for management of her chronic medical conditions. At her last visit on 11/26/16, the patient's BP was above goal and hydralazine 25 mg BID was added to her regimen of HCTZ 25 mg and amlodipine 10 mg. The patient also endorsed worsening depression and her sertraline was increased from 50 mg to 100 mg. Per chart review the patient underwent a TVH and right salpingectomy on 12/12/16, as Jean [name] was experiencing worsening menstrual pain and bleeding 2/2 uterine leiomyomas prior to this procedure. The patient was to follow up with Dr. Rip Harbour after her surgery, but did not attend her previously scheduled appointment.   Since her last visit the patient has felt well. The patient has been unable to make the changes to her medication regimen outlined at her 11/26/16 visit. Since her last visit she stopped taking amlodipine because it caused her lips to swell about 1 month ago. She never started taking the hydralazine as previously prescribed because it was "too many pills". She has been taking hydrochlorothiazide, but only 1/2 a tablet a day. She states that she wants to stop taking HCTZ completely, because it makes her to become dizzy on occasion. She mainly feels dizzy when she gets out of bed in the morning. Her dizziness lasts for under a minute and usually self resolves. She states that she has 1-2 episodes of dizziness per week.  She states that her mood symptoms have worsened since her last visit. She hasn't had the energy or motivation to do any of her usual activities of daily living. She reports that she only wants to sleep in her room all day and doesn't really like to go outside like she used to. She stopped taking sertraline about 1 month ago and notes that her mood has worsened since then. About 3 weeks ago she used cocaine and states that she feels like using  again because it helps her mood. She denies wanting to hurt herself or end her life. She has no current plans to hurt or kill herself.   Past Medical History:  Diagnosis Date  . Arthritis    hip  . Asthma   . Bronchitis    states at least 2-3 months ago  . Carpal tunnel syndrome    bilateral  . Chronic lower back pain   . Depression   . Dyspnea    with exertion  . GERD (gastroesophageal reflux disease)   . Headache    migraines  . Herpes   . Hypertension   . SVD (spontaneous vaginal delivery)    x 2  . Uterine leiomyoma 02/16/2016   Review of Systems:   Patient endorses intermittent dizziness with standing that self resolves in 1 minute, as per HPI Patient denies chest pain, shortness of breath, abdominal pain, diaphoresis, nausea/vomiting, lower extremity swelling, and change in bowel/bladder habits.  Physical Exam:  Vitals:   01/21/17 1458  BP: 138/90  Temp: 97.9 F (36.6 C)  TempSrc: Oral  SpO2: 100%  Weight: 63.5 kg (139 lb 14.4 oz)  Height: 5\' 3"  (1.6 m)   Physical Exam  Constitutional: She appears well-developed and well-nourished. No distress.  Cardiovascular: Normal rate and regular rhythm.  Exam reveals no friction rub.   No murmur heard. 2+ radial pulses bilaterally  Pulmonary/Chest: Effort normal. No respiratory distress. She has no wheezes.  No crackles on  exam  Abdominal: Soft. She exhibits no distension. There is no tenderness.  Musculoskeletal: She exhibits no edema (of bilateral lower extremities) or tenderness (of bilateral lower extremities).  Neurological:  EOM intact. Face symmetric. Tongue midline. UE flexion/extension at shoulder 5/5 bilaterally. LE flexion/extension at knee 5/5 bilaterally, dorsiflexion and plantar flexion 5/5 bilaterally. Patient walks with cane at baseline with abnormal gait 2/2 hip arthritis and chronic back pain.  Skin: Skin is warm and dry. Capillary refill takes less than 2 seconds. No rash noted. No erythema. No pallor.   Psychiatric: She has a normal mood and affect. Judgment and thought content normal.  Patient denies SI/HI.   Assessment & Plan:   See Encounters Tab for problem based charting.  Patient seen with Dr. Evette Doffing

## 2017-01-21 NOTE — Assessment & Plan Note (Signed)
Patient has chronic back and hip pain from DDD and hip OA. She has been stable on ibuprofen, flexeril, and gapapentin for pain. Patient currently being seen by PT for help with her pain and to improve gait/strengthen back. Refills were prescribed for this medication today. Patient instructed to keep PT appointment tomorrow and continue follow up regarding treatment of her DDD and hip arthritis.   Plan: -Follow up with PT regarding their treatment recommendations -Continue PRN use of flexeril, 5 mg BID, ibuprofen 600 mg daily, and gabapentin 100 mg TID

## 2017-01-21 NOTE — Patient Instructions (Addendum)
Thank you for seeing [place] today.  Please take 1 tablet of your hydrochlorothiazide, 25 mg daily. Please also take 1 tablet of sertraline, 50 mg daily.   Please return to clinic once you receive your orange card in 4-8 weeks for evaluation of BP and depression.

## 2017-01-22 ENCOUNTER — Telehealth: Payer: Self-pay | Admitting: *Deleted

## 2017-01-22 ENCOUNTER — Encounter: Payer: Self-pay | Admitting: Internal Medicine

## 2017-01-22 ENCOUNTER — Ambulatory Visit: Payer: Self-pay | Attending: Family Medicine | Admitting: Rehabilitative and Restorative Service Providers"

## 2017-01-22 ENCOUNTER — Ambulatory Visit: Payer: Self-pay

## 2017-01-22 NOTE — Telephone Encounter (Signed)
Pt calls and states she is out of bp medicine and her bp is elevated, she states she is dizzy, appt given for 1545.

## 2017-01-22 NOTE — Progress Notes (Signed)
Internal Medicine Clinic Attending  I saw and evaluated the patient.  I personally confirmed the key portions of the history and exam documented by Dr. Nedrud and I reviewed pertinent patient test results.  The assessment, diagnosis, and plan were formulated together and I agree with the documentation in the resident's note.  

## 2017-01-22 NOTE — Telephone Encounter (Signed)
Called pt, she states she is feeling much better, she is advised if she feels worse or has severe H/A, N&V, chest pain, shortness of breath, weakness or numbness, vision changes, changes of speech to call 911 or have someone to bring her to [place] she is agreeable

## 2017-01-30 ENCOUNTER — Telehealth: Payer: Self-pay | Admitting: Pharmacist

## 2017-01-30 ENCOUNTER — Ambulatory Visit: Payer: Self-pay | Attending: Physician Assistant | Admitting: Physical Therapy

## 2017-01-30 ENCOUNTER — Encounter: Payer: Self-pay | Admitting: Physical Therapy

## 2017-01-30 DIAGNOSIS — M25512 Pain in left shoulder: Secondary | ICD-10-CM | POA: Insufficient documentation

## 2017-01-30 DIAGNOSIS — M542 Cervicalgia: Secondary | ICD-10-CM | POA: Insufficient documentation

## 2017-01-30 DIAGNOSIS — M25611 Stiffness of right shoulder, not elsewhere classified: Secondary | ICD-10-CM | POA: Insufficient documentation

## 2017-01-30 DIAGNOSIS — M6281 Muscle weakness (generalized): Secondary | ICD-10-CM | POA: Insufficient documentation

## 2017-01-30 DIAGNOSIS — F4321 Adjustment disorder with depressed mood: Secondary | ICD-10-CM

## 2017-01-30 DIAGNOSIS — I10 Essential (primary) hypertension: Secondary | ICD-10-CM

## 2017-01-30 DIAGNOSIS — G894 Chronic pain syndrome: Secondary | ICD-10-CM

## 2017-01-30 DIAGNOSIS — G8929 Other chronic pain: Secondary | ICD-10-CM | POA: Insufficient documentation

## 2017-01-30 MED ORDER — GABAPENTIN 100 MG PO CAPS: 100.0000 mg | ORAL_CAPSULE | Freq: Three times a day (TID) | ORAL | 2 refills | Status: DC

## 2017-01-30 MED ORDER — HYDROCHLOROTHIAZIDE 25 MG PO TABS: 25.0000 mg | ORAL_TABLET | Freq: Every day | ORAL | 2 refills | Status: DC

## 2017-01-30 MED ORDER — SERTRALINE HCL 50 MG PO TABS: 50.0000 mg | ORAL_TABLET | Freq: Every day | ORAL | 11 refills | Status: DC

## 2017-01-30 NOTE — Progress Notes (Signed)
Patient need transfers to [facility] pharmacy. Prescriptions sent for gabapentin, sertraline, and HCTZ.

## 2017-01-30 NOTE — Therapy (Signed)
[place], [place], [address] Phone: [phone]   Fax:  [phone]  Physical Therapy Treatment  Patient Details  Name: Jean [name] MRN: [id number] Date of Birth: 01-23-1966 Referring Provider: Pete Pelt, PA-C  Encounter Date: 01/30/2017      PT End of Session - 01/30/17 1422    Visit Number 1   Number of Visits 13   Date for PT Re-Evaluation 03/15/17   Authorization Type CAFA exp 04/24/17   PT Start Time 1422  pt arrived late   PT Stop Time 1458   PT Time Calculation (min) 36 min   Activity Tolerance Patient limited by pain   Behavior During Therapy [facility] for tasks assessed/performed      Past Medical History:  Diagnosis Date  . Arthritis    hip  . Asthma   . Bronchitis    states at least 2-3 months ago  . Carpal tunnel syndrome    bilateral  . Chronic lower back pain   . Depression   . Dyspnea    with exertion  . GERD (gastroesophageal reflux disease)   . Headache    migraines  . Herpes   . Hypertension   . SVD (spontaneous vaginal delivery)    x 2  . Uterine leiomyoma 02/16/2016    Past Surgical History:  Procedure Laterality Date  . SALPINGECTOMY     lap - ectopic preg  . TOTAL HIP ARTHROPLASTY Left 01/20/2016   Procedure: LEFT TOTAL HIP ARTHROPLASTY ANTERIOR APPROACH;  Surgeon: Mcarthur Rossetti, MD;  Location: [facility];  Service: Orthopedics;  Laterality: Left;  Marland Kitchen VAGINAL HYSTERECTOMY Right 12/11/2016   Procedure: HYSTERECTOMY VAGINAL WITH RIGHT SALPINGECTOMY;  Surgeon: Chancy Milroy, MD;  Location: [facility];  Service: Gynecology;  Laterality: Right;    There were no vitals filed for this visit.      Subjective Assessment - 01/30/17 1433    Subjective Pt reports L neck/shoulder have been bothering her for 3+ years and has gotten worse over last 2 months, unknown reason. L hand N/t. Constant aching. Difficulty raising arm OH. Avoids using L arm. My ribs are not  like they are supposed to be, I broke one and it didn't heal right and there is a spur. I feel like I need to pop my shoulder, it gets stuck. HA approx every other day. Denies pain with deep breathing.    Patient Stated Goals be able to use Left arm   Currently in Pain? Yes   Pain Score 7    Pain Location Shoulder   Pain Orientation Left   Pain Descriptors / Indicators Aching   Aggravating Factors  always hurts   Pain Relieving Factors has not done anything, pain medication helpful            [facility] PT Assessment - 01/30/17 0001      Assessment   Medical Diagnosis Lt shoulder impingement syndrome   Referring Provider Pete Pelt, PA-C   Onset Date/Surgical Date --  chronic   Hand Dominance Right   Next MD Visit --  not scheduled at this time   Prior Therapy no     Precautions   Precautions None     Restrictions   Weight Bearing Restrictions No     Balance Screen   Has the patient fallen in the past 6 months No     [place] residence   Living Arrangements Non-relatives/Friends  Prior Function   Level of Independence Independent   Vocation Unemployed     Cognition   Overall Cognitive Status Within Functional Limits for tasks assessed     Observation/Other Assessments   Focus on Therapeutic Outcomes (FOTO)  51% limited     Sensation   Additional Comments L hand numbness "it goes numb alot"     Posture/Postural Control   Posture Comments kyphotic with rounded shoulders     ROM / Strength   AROM / PROM / Strength AROM;Strength     AROM   Overall AROM Comments PROM tight but WFL   AROM Assessment Site Shoulder;Cervical   Right/Left Shoulder Left   Left Shoulder Flexion 94 Degrees   Left Shoulder ABduction 30 Degrees   Cervical Flexion 30   Cervical Extension 12   Cervical - Right Rotation 30   Cervical - Left Rotation 20     Strength   Overall Strength Comments gross 3-/5   Strength Assessment Site Shoulder    Right/Left Shoulder Right     Palpation   Palpation comment TTP upper trap, proximal biceps, distal supraspinatus insertion.                      [facility] Adult PT Treatment/Exercise - 01/30/17 0001      Exercises   Exercises Shoulder     Shoulder Exercises: Seated   Retraction Limitations scapular retraction     Shoulder Exercises: Stretch   Other Shoulder Stretches upper trap & levator stretch     Manual Therapy   Manual therapy comments educated in use of tennis ball for self massage                PT Education - 01/30/17 1521    Education provided Yes   Education Details anatomy of condition, POC, HEP, exercise form/rationale   Person(s) Educated Patient   Methods Explanation;Demonstration;Tactile cues;Verbal cues;Handout   Comprehension Verbalized understanding;Returned demonstration;Verbal cues required;Tactile cues required;Need further instruction          PT Short Term Goals - 01/30/17 1515      PT SHORT TERM GOAL #1   Title GHJ flx & abd to 100 deg   Baseline see flowsheet   Time 3   Period Weeks   Status New   Target Date 02/22/17     PT SHORT TERM GOAL #2   Title 10 deg improvement in cervical flexion & rotation   Baseline see flowsheet   Time 3   Period Weeks   Status New   Target Date 02/22/17     PT SHORT TERM GOAL #3   Title .     PT SHORT TERM GOAL #4   Title .           PT Long Term Goals - 01/30/17 1516      PT LONG TERM GOAL #1   Title FOTO to 40% limitation to indicate significant improvement in functional ability   Baseline 51% limited at eval   Time 6   Period Weeks   Status New   Target Date 03/15/17     PT LONG TERM GOAL #2   Title Pt will be able to use L upper extremity for self care activities such as dressing/bathing with minimal discomfort   Baseline unable to use L arm at eval   Time 6   Period Weeks   Status New   Target Date 03/15/17     PT LONG TERM GOAL #3  Title Pt will be able  to carry light grocery bags in L hand   Baseline does not carry anything in L hand   Time 6   Period Weeks   Status New   Target Date 03/15/17     PT LONG TERM GOAL #4   Title GHJ strength to gross 4+/5 for proper support to biomechanical chain   Baseline gross 3-/5 at eval   Time 6   Period Weeks   Status New   Target Date 03/15/17     PT LONG TERM GOAL #5   Title .                Plan - 01/30/17 1507    Clinical Impression Statement Pt presents to PT with complaints of Left neck/shoulder pain that began 3+ years ago but has worsened in the last 2 months. Significant limitation in AROM but pt does not demo shoulder hike. Pt is able to hold arm at 90 abd for a short period of time without drop indicating lack of RC tear. Significant tightness in Left upper trap resulting in poor shoulder and cervical motion. Pt will benefit from skilled PT in order to improve functional ROM and strength to meet long term goals.    History and Personal Factors relevant to plan of care: arthritis, asthma, depression, HTN   Clinical Presentation Evolving   Clinical Presentation due to: recent worsening of chronic pain   Clinical Decision Making Moderate   Rehab Potential Good   PT Frequency 2x / week   PT Duration 6 weeks   PT Treatment/Interventions ADLs/Self Care Home Management;Cryotherapy;Electrical Stimulation;Iontophoresis 4mg /ml Dexamethasone;Functional mobility training;Ultrasound;Traction;Moist Heat;Therapeutic activities;Therapeutic exercise;Neuromuscular re-education;Patient/family education;Passive range of motion;Manual techniques;Dry needling;Taping   PT Next Visit Plan DN upper trap, GHJ ROM with wand   PT Home Exercise Plan upper trap & levator stretch, scapular retraction, tennis ball soft tissue mobilization;    Consulted and Agree with Plan of Care Patient      Patient will benefit from skilled therapeutic intervention in order to improve the following deficits and  impairments:  Decreased range of motion, Impaired UE functional use, Increased muscle spasms, Decreased activity tolerance, Pain, Improper body mechanics, Impaired flexibility, Decreased strength, Impaired sensation, Postural dysfunction  Visit Diagnosis: Chronic left shoulder pain - Plan: PT plan of care cert/re-cert  Cervicalgia - Plan: PT plan of care cert/re-cert  Muscle weakness (generalized) - Plan: PT plan of care cert/re-cert  Stiffness of right shoulder, not elsewhere classified - Plan: PT plan of care cert/re-cert     Problem List Patient Active Problem List   Diagnosis Date Noted  . Post-operative state 12/11/2016  . CKD (chronic kidney disease) 10/29/2016  . Chest wall pain 10/15/2016  . Menorrhagia 08/27/2016  . Chronic pain syndrome 05/10/2016  . Uterine leiomyoma 02/16/2016  . Liver lesion 12/01/2015  . Situational depression 10/19/2015  . HTN (hypertension) 07/08/2015  . Routine health maintenance 07/08/2015  . Tobacco abuse 07/08/2015    Jean [name] PT, DPT 01/30/17 3:25 PM   [facility] [address] [place], [place], [address] Phone: [phone]   Fax:  [phone]  Name: Jean [name] MRN: [id number] Date of Birth: 22-Mar-1966

## 2017-02-01 ENCOUNTER — Other Ambulatory Visit: Payer: Self-pay | Admitting: *Deleted

## 2017-02-01 ENCOUNTER — Other Ambulatory Visit: Payer: Self-pay

## 2017-02-01 DIAGNOSIS — M5134 Other intervertebral disc degeneration, thoracic region: Secondary | ICD-10-CM

## 2017-02-01 DIAGNOSIS — G894 Chronic pain syndrome: Secondary | ICD-10-CM

## 2017-02-01 DIAGNOSIS — R0609 Other forms of dyspnea: Secondary | ICD-10-CM

## 2017-02-01 MED ORDER — RANITIDINE HCL 150 MG PO TABS: 150.0000 mg | ORAL_TABLET | Freq: Two times a day (BID) | ORAL | 1 refills | Status: DC

## 2017-02-01 NOTE — Telephone Encounter (Signed)
Requesting all meds to be filled @ [facility].

## 2017-02-04 ENCOUNTER — Ambulatory Visit: Payer: Self-pay | Attending: Physician Assistant | Admitting: Physical Therapy

## 2017-02-04 ENCOUNTER — Encounter: Payer: Self-pay | Admitting: Physical Therapy

## 2017-02-04 DIAGNOSIS — M6281 Muscle weakness (generalized): Secondary | ICD-10-CM | POA: Insufficient documentation

## 2017-02-04 DIAGNOSIS — R262 Difficulty in walking, not elsewhere classified: Secondary | ICD-10-CM | POA: Insufficient documentation

## 2017-02-04 DIAGNOSIS — M25552 Pain in left hip: Secondary | ICD-10-CM | POA: Insufficient documentation

## 2017-02-04 DIAGNOSIS — G8929 Other chronic pain: Secondary | ICD-10-CM | POA: Insufficient documentation

## 2017-02-04 DIAGNOSIS — R2689 Other abnormalities of gait and mobility: Secondary | ICD-10-CM | POA: Insufficient documentation

## 2017-02-04 DIAGNOSIS — M79605 Pain in left leg: Secondary | ICD-10-CM | POA: Insufficient documentation

## 2017-02-04 DIAGNOSIS — M546 Pain in thoracic spine: Secondary | ICD-10-CM | POA: Insufficient documentation

## 2017-02-04 DIAGNOSIS — M25512 Pain in left shoulder: Secondary | ICD-10-CM | POA: Insufficient documentation

## 2017-02-04 DIAGNOSIS — M25611 Stiffness of right shoulder, not elsewhere classified: Secondary | ICD-10-CM | POA: Insufficient documentation

## 2017-02-04 DIAGNOSIS — M542 Cervicalgia: Secondary | ICD-10-CM | POA: Insufficient documentation

## 2017-02-04 NOTE — Therapy (Signed)
[place], [place], [address] Phone: [phone]   Fax:  [phone]  Physical Therapy Treatment  Patient Details  Name: Jean [name] MRN: [id number] Date of Birth: [birth year] Referring Provider: Pete Pelt, PA-C  Encounter Date: 02/04/2017      PT End of Session - 02/04/17 0933    Visit Number 2   Number of Visits 13   Date for PT Re-Evaluation 03/15/17   Authorization Type CAFA exp 04/24/17   PT Start Time 0934   PT Stop Time 1028   PT Time Calculation (min) 54 min   Activity Tolerance Patient tolerated treatment well   Behavior During Therapy [facility] for tasks assessed/performed      Past Medical History:  Diagnosis Date  . Arthritis    hip  . Asthma   . Bronchitis    states at least 2-3 months ago  . Carpal tunnel syndrome    bilateral  . Chronic lower back pain   . Depression   . Dyspnea    with exertion  . GERD (gastroesophageal reflux disease)   . Headache    migraines  . Herpes   . Hypertension   . SVD (spontaneous vaginal delivery)    x 2  . Uterine leiomyoma 02/16/2016    Past Surgical History:  Procedure Laterality Date  . SALPINGECTOMY     lap - ectopic preg  . TOTAL HIP ARTHROPLASTY Left 01/20/2016   Procedure: LEFT TOTAL HIP ARTHROPLASTY ANTERIOR APPROACH;  Surgeon: Mcarthur Rossetti, MD;  Location: [facility];  Service: Orthopedics;  Laterality: Left;  Marland Kitchen VAGINAL HYSTERECTOMY Right 12/11/2016   Procedure: HYSTERECTOMY VAGINAL WITH RIGHT SALPINGECTOMY;  Surgeon: Chancy Milroy, MD;  Location: [facility];  Service: Gynecology;  Laterality: Right;    There were no vitals filed for this visit.      Subjective Assessment - 02/04/17 0934    Subjective Feels lke she slept wrong on her neck.    Patient Stated Goals be able to use Left arm   Currently in Pain? Yes   Pain Score 8    Pain Location Shoulder   Pain Orientation Left   Pain Descriptors / Indicators Sore   stiff                         [facility] Adult PT Treatment/Exercise - 02/04/17 0001      Shoulder Exercises: Supine   External Rotation 20 reps   Theraband Level (Shoulder External Rotation) Level 2 (Red)   Flexion Limitations flexion supine with wand   Other Supine Exercises cervical retraction     Shoulder Exercises: Stretch   Other Shoulder Stretches cervical rotation SNAG   Other Shoulder Stretches upper trap stretch     Modalities   Modalities Electrical Stimulation     Electrical Stimulation   Electrical Stimulation Location cervical   Electrical Stimulation Action IFC   Electrical Stimulation Parameters 15 min to tolerance   Electrical Stimulation Goals Pain     Manual Therapy   Manual Therapy Soft tissue mobilization   Soft tissue mobilization right upper trap & trigger point release          Trigger Point Dry Needling - 02/04/17 0956    Consent Given? Yes   Education Handout Provided --  verbal edu   Muscles Treated Upper Body Upper trapezius   Upper Trapezius Response Twitch reponse elicited;Palpable increased muscle length  unable to tolerate  PT Education - 02/04/17 1016    Education provided Yes   Education Details importance of regular mobility, TPDN & expected outcomes, HEP, exercise form/rationale, soreness with iincreased movement   Person(s) Educated Patient   Methods Explanation;Demonstration;Tactile cues;Verbal cues;Handout   Comprehension Verbalized understanding;Returned demonstration;Verbal cues required;Tactile cues required;Need further instruction          PT Short Term Goals - 01/30/17 1515      PT SHORT TERM GOAL #1   Title GHJ flx & abd to 100 deg   Baseline see flowsheet   Time 3   Period Weeks   Status New   Target Date 02/22/17     PT SHORT TERM GOAL #2   Title 10 deg improvement in cervical flexion & rotation   Baseline see flowsheet   Time 3   Period Weeks   Status New   Target  Date 02/22/17     PT SHORT TERM GOAL #3   Title .     PT SHORT TERM GOAL #4   Title .           PT Long Term Goals - 01/30/17 1516      PT LONG TERM GOAL #1   Title FOTO to 40% limitation to indicate significant improvement in functional ability   Baseline 51% limited at eval   Time 6   Period Weeks   Status New   Target Date 03/15/17     PT LONG TERM GOAL #2   Title Pt will be able to use L upper extremity for self care activities such as dressing/bathing with minimal discomfort   Baseline unable to use L arm at eval   Time 6   Period Weeks   Status New   Target Date 03/15/17     PT LONG TERM GOAL #3   Title Pt will be able to carry light grocery bags in L hand   Baseline does not carry anything in L hand   Time 6   Period Weeks   Status New   Target Date 03/15/17     PT LONG TERM GOAL #4   Title GHJ strength to gross 4+/5 for proper support to biomechanical chain   Baseline gross 3-/5 at eval   Time 6   Period Weeks   Status New   Target Date 03/15/17     PT LONG TERM GOAL #5   Title .                Plan - 02/04/17 1017    Clinical Impression Statement Two twitches with TPDN today but pt was unable to tolerate it to continue. Was able to turn head to talk rather than turning her whole body after treatment.    PT Treatment/Interventions ADLs/Self Care Home Management;Cryotherapy;Electrical Stimulation;Iontophoresis 4mg /ml Dexamethasone;Functional mobility training;Ultrasound;Traction;Moist Heat;Therapeutic activities;Therapeutic exercise;Neuromuscular re-education;Patient/family education;Passive range of motion;Manual techniques;Dry needling;Taping   PT Next Visit Plan STM to upper trap, manal cervical traction, periscapualr strengthening   PT Home Exercise Plan upper trap & levator stretch, scapular retraction, tennis ball soft tissue mobilization; cervical retraction, rotation SNAG, supine GHJ flx with wand   Consulted and Agree with Plan of Care  Patient      Patient will benefit from skilled therapeutic intervention in order to improve the following deficits and impairments:  Decreased range of motion, Impaired UE functional use, Increased muscle spasms, Decreased activity tolerance, Pain, Improper body mechanics, Impaired flexibility, Decreased strength, Impaired sensation, Postural dysfunction  Visit Diagnosis: Chronic left shoulder pain  Cervicalgia  Muscle weakness (generalized)  Stiffness of right shoulder, not elsewhere classified     Problem List Patient Active Problem List   Diagnosis Date Noted  . Post-operative state 12/11/2016  . CKD (chronic kidney disease) 10/29/2016  . Chest wall pain 10/15/2016  . Menorrhagia 08/27/2016  . Chronic pain syndrome 05/10/2016  . Uterine leiomyoma 02/16/2016  . Liver lesion 12/01/2015  . Situational depression 10/19/2015  . HTN (hypertension) 07/08/2015  . Routine health maintenance 07/08/2015  . Tobacco abuse 07/08/2015    Amaura Authier C. Masiah Woody PT, DPT 02/04/17 10:20 AM   [facility] [address] [place], [place], [address] Phone: [phone]   Fax:  [phone]  Name: Jean [name] MRN: [id number] Date of Birth: 04/07/1966

## 2017-02-05 MED ORDER — CYCLOBENZAPRINE HCL 10 MG PO TABS: 5.0000 mg | ORAL_TABLET | Freq: Two times a day (BID) | ORAL | 0 refills | Status: DC | PRN

## 2017-02-05 MED ORDER — ALBUTEROL SULFATE HFA 108 (90 BASE) MCG/ACT IN AERS: 2.0000 | INHALATION_SPRAY | Freq: Four times a day (QID) | RESPIRATORY_TRACT | 6 refills | Status: DC | PRN

## 2017-02-05 MED ORDER — IBUPROFEN 600 MG PO TABS: 600.0000 mg | ORAL_TABLET | Freq: Four times a day (QID) | ORAL | 0 refills | Status: DC | PRN

## 2017-02-06 ENCOUNTER — Ambulatory Visit: Payer: Self-pay | Admitting: Physical Therapy

## 2017-02-06 DIAGNOSIS — M25512 Pain in left shoulder: Principal | ICD-10-CM

## 2017-02-06 DIAGNOSIS — G8929 Other chronic pain: Secondary | ICD-10-CM

## 2017-02-06 DIAGNOSIS — M6281 Muscle weakness (generalized): Secondary | ICD-10-CM

## 2017-02-06 DIAGNOSIS — M542 Cervicalgia: Secondary | ICD-10-CM

## 2017-02-06 DIAGNOSIS — M25611 Stiffness of right shoulder, not elsewhere classified: Secondary | ICD-10-CM

## 2017-02-06 NOTE — Therapy (Signed)
[facility] [place], [place], [address] Phone: [phone]   Fax:  [phone]  Physical Therapy Treatment  Patient Details  Name: Jean [name] MRN: [id number] Date of Birth: 1966-02-21 Referring Provider: Pete Pelt, PA-C  Encounter Date: 02/06/2017      PT End of Session - 02/06/17 0936    Visit Number 3   Number of Visits 13   Date for PT Re-Evaluation 03/15/17   Authorization Type CAFA exp 04/24/17   PT Start Time 0931   PT Stop Time 1030   PT Time Calculation (min) 59 min      Past Medical History:  Diagnosis Date  . Arthritis    hip  . Asthma   . Bronchitis    states at least 2-3 months ago  . Carpal tunnel syndrome    bilateral  . Chronic lower back pain   . Depression   . Dyspnea    with exertion  . GERD (gastroesophageal reflux disease)   . Headache    migraines  . Herpes   . Hypertension   . SVD (spontaneous vaginal delivery)    x 2  . Uterine leiomyoma 02/16/2016    Past Surgical History:  Procedure Laterality Date  . SALPINGECTOMY     lap - ectopic preg  . TOTAL HIP ARTHROPLASTY Left 01/20/2016   Procedure: LEFT TOTAL HIP ARTHROPLASTY ANTERIOR APPROACH;  Surgeon: Mcarthur Rossetti, MD;  Location: [facility];  Service: Orthopedics;  Laterality: Left;  Marland Kitchen VAGINAL HYSTERECTOMY Right 12/11/2016   Procedure: HYSTERECTOMY VAGINAL WITH RIGHT SALPINGECTOMY;  Surgeon: Chancy Milroy, MD;  Location: [facility];  Service: Gynecology;  Laterality: Right;    There were no vitals filed for this visit.      Subjective Assessment - 02/06/17 0934    Subjective Neck feels better, its moved down to upper back    Currently in Pain? Yes   Pain Score 7    Pain Location Back   Pain Orientation Left;Upper   Pain Descriptors / Indicators Tightness  want to pop   Aggravating Factors  slouching    Pain Relieving Factors good posutre            [facility] PT Assessment - 02/06/17 0001       AROM   Cervical - Right Rotation 50   Cervical - Left Rotation 50                     [facility] Adult PT Treatment/Exercise - 02/06/17 0001      Shoulder Exercises: Supine   Horizontal ABduction Limitations attempted with yellow band, too painful so performed isometrics x 10 gently with manual resistance    Flexion Limitations flexion supine with isometric hold into belt    Other Supine Exercises cross body rhomboid stretch      Shoulder Exercises: Seated   Retraction Limitations scapular retraction     Shoulder Exercises: Prone   Retraction 10 reps   Retraction Limitations Row   Extension 10 reps   Horizontal ABduction 1 10 reps     Shoulder Exercises: Stretch   Other Shoulder Stretches upper trap & levator stretch     Modalities   Modalities Moist Heat     Moist Heat Therapy   Number Minutes Moist Heat 15 Minutes   Moist Heat Location Cervical  and thoracic supine     Manual Therapy   Manual therapy comments scapular mobs, sidelying, PROM shoulder flexion, abduction ER,  IASTM to left thoracic paraspinals.                   PT Short Term Goals - 01/30/17 1515      PT SHORT TERM GOAL #1   Title GHJ flx & abd to 100 deg   Baseline see flowsheet   Time 3   Period Weeks   Status New   Target Date 02/22/17     PT SHORT TERM GOAL #2   Title 10 deg improvement in cervical flexion & rotation   Baseline see flowsheet   Time 3   Period Weeks   Status New   Target Date 02/22/17     PT SHORT TERM GOAL #3   Title .     PT SHORT TERM GOAL #4   Title .           PT Long Term Goals - 01/30/17 1516      PT LONG TERM GOAL #1   Title FOTO to 40% limitation to indicate significant improvement in functional ability   Baseline 51% limited at eval   Time 6   Period Weeks   Status New   Target Date 03/15/17     PT LONG TERM GOAL #2   Title Pt will be able to use L upper extremity for self care activities such as dressing/bathing with minimal  discomfort   Baseline unable to use L arm at eval   Time 6   Period Weeks   Status New   Target Date 03/15/17     PT LONG TERM GOAL #3   Title Pt will be able to carry light grocery bags in L hand   Baseline does not carry anything in L hand   Time 6   Period Weeks   Status New   Target Date 03/15/17     PT LONG TERM GOAL #4   Title GHJ strength to gross 4+/5 for proper support to biomechanical chain   Baseline gross 3-/5 at eval   Time 6   Period Weeks   Status New   Target Date 03/15/17     PT LONG TERM GOAL #5   Title .                Plan - 02/06/17 1038    Clinical Impression Statement Pt reports her pain is now in left upper back, periscapular area. She reports no pain in neck/shoulder. Her Cervical AROM improved to 50 degrees bilateral. IASTM perfromed to left thoracic paraspinals follw by shoulder mobs, genltle scapular exercises, neck stretches and HMP. Pt reports upper back feels better at end of treatment however neck felt tight again. Pt has a referral for gait evaluation. Scheduled her next visit with primary PT as as ERO.    PT Next Visit Plan STM to upper trap, manal cervical traction, periscapualr strengthening- ERO- Referral for Gait Eval   PT Home Exercise Plan upper trap & levator stretch, scapular retraction, tennis ball soft tissue mobilization; cervical retraction, rotation SNAG, supine GHJ flx with wand   Consulted and Agree with Plan of Care Patient      Patient will benefit from skilled therapeutic intervention in order to improve the following deficits and impairments:  Decreased range of motion, Impaired UE functional use, Increased muscle spasms, Decreased activity tolerance, Pain, Improper body mechanics, Impaired flexibility, Decreased strength, Impaired sensation, Postural dysfunction  Visit Diagnosis: Chronic left shoulder pain  Cervicalgia  Muscle weakness (generalized)  Stiffness of right shoulder, not  elsewhere  classified     Problem List Patient Active Problem List   Diagnosis Date Noted  . Post-operative state 12/11/2016  . CKD (chronic kidney disease) 10/29/2016  . Chest wall pain 10/15/2016  . Menorrhagia 08/27/2016  . Chronic pain syndrome 05/10/2016  . Uterine leiomyoma 02/16/2016  . Liver lesion 12/01/2015  . Situational depression 10/19/2015  . HTN (hypertension) 07/08/2015  . Routine health maintenance 07/08/2015  . Tobacco abuse 07/08/2015    Jean [name], Jean [name] 02/06/2017, 10:42 AM  [facility] [address] [place], [place], [address] Phone: [phone]   Fax:  [phone]  Name: Jean [name] MRN: [id number] Date of Birth: [birth year]

## 2017-02-11 ENCOUNTER — Telehealth: Payer: Self-pay | Admitting: Physical Therapy

## 2017-02-11 ENCOUNTER — Ambulatory Visit: Payer: Self-pay | Admitting: Physical Therapy

## 2017-02-11 NOTE — Telephone Encounter (Signed)
Left message regarding missed appointment this morning. Left reminder of next appointment date and time. Asked her to call prior to appointment if she need to cancel or reschedule.

## 2017-02-13 ENCOUNTER — Encounter: Payer: Self-pay | Admitting: Physical Therapy

## 2017-02-13 ENCOUNTER — Ambulatory Visit: Payer: Self-pay | Admitting: Physical Therapy

## 2017-02-13 DIAGNOSIS — M6281 Muscle weakness (generalized): Secondary | ICD-10-CM

## 2017-02-13 DIAGNOSIS — M25512 Pain in left shoulder: Principal | ICD-10-CM

## 2017-02-13 DIAGNOSIS — M542 Cervicalgia: Secondary | ICD-10-CM

## 2017-02-13 DIAGNOSIS — G8929 Other chronic pain: Secondary | ICD-10-CM

## 2017-02-13 DIAGNOSIS — R262 Difficulty in walking, not elsewhere classified: Secondary | ICD-10-CM

## 2017-02-13 NOTE — Therapy (Signed)
[facility], [place], [address] Phone: [phone]   Fax:  [phone]  Physical Therapy Treatment/Re-evaluation  Patient Details  Name: Jean [name] MRN: [id number] Date of Birth: 1965/12/13 Referring Provider: Abner Greenspan, MD  Encounter Date: 02/13/2017      PT End of Session - 02/13/17 0934    Visit Number 4   Number of Visits 13   Date for PT Re-Evaluation 03/15/17   Authorization Type CAFA exp 04/24/17   PT Start Time 0934   PT Stop Time 1012   PT Time Calculation (min) 38 min   Activity Tolerance Patient tolerated treatment well   Behavior During Therapy [facility] for tasks assessed/performed      Past Medical History:  Diagnosis Date  . Arthritis    hip  . Asthma   . Bronchitis    states at least 2-3 months ago  . Carpal tunnel syndrome    bilateral  . Chronic lower back pain   . Depression   . Dyspnea    with exertion  . GERD (gastroesophageal reflux disease)   . Headache    migraines  . Herpes   . Hypertension   . SVD (spontaneous vaginal delivery)    x 2  . Uterine leiomyoma 02/16/2016    Past Surgical History:  Procedure Laterality Date  . SALPINGECTOMY     lap - ectopic preg  . TOTAL HIP ARTHROPLASTY Left 01/20/2016   Procedure: LEFT TOTAL HIP ARTHROPLASTY ANTERIOR APPROACH;  Surgeon: Mcarthur Rossetti, MD;  Location: [facility];  Service: Orthopedics;  Laterality: Left;  Marland Kitchen VAGINAL HYSTERECTOMY Right 12/11/2016   Procedure: HYSTERECTOMY VAGINAL WITH RIGHT SALPINGECTOMY;  Surgeon: Chancy Milroy, MD;  Location: [facility];  Service: Gynecology;  Laterality: Right;    There were no vitals filed for this visit.      Subjective Assessment - 02/13/17 0934    Subjective Pt noted to move neck freely when turning L to look at friend with her, reports it still hurts to do so. Pt had left hip replacement Sept 207 and has had difficulty with her walking since  then. She reports that her balance is off, it hurts (pointing to bilateral thighs) to get up, Left is worse. Feels like she is not walking straight without cane. Legs feel like they give out. No pain other than when she gets out of a chair or steps up into truck. Reports she is trying to do exercises that she was given after her surgery. Has to set L leg out to the side to bend to pick up something from floor.    Patient Stated Goals be able to use Left arm            [facility] PT Assessment - 02/13/17 0001      Assessment   Medical Diagnosis Lt shoulder impingement, gait disorder   Referring Provider Pete Pelt, PA-C, Dickie La, MD     ROM / Strength   AROM / PROM / Strength Strength     Strength   Strength Assessment Site Hip;Knee   Right/Left Hip Right;Left   Right Hip Flexion 5/5   Right Hip Extension 5/5   Right Hip ABduction 4/5   Left Hip Flexion 3/5   Left Hip Extension 3/5   Left Hip ABduction 3/5   Right/Left Knee Right;Left   Right Knee Flexion 4+/5   Right Knee Extension 5/5   Left Knee Flexion  5/5   Left Knee Extension 4+/5     Palpation   Palpation comment notable LLD- L longer                     [facility] Adult PT Treatment/Exercise - 02/13/17 0001      Exercises   Exercises Knee/Hip     Knee/Hip Exercises: Standing   Gait Training with lift, with & without SPC     Knee/Hip Exercises: Supine   Bridges with Ball Squeeze 10 reps   Straight Leg Raises 10 reps;Left   Straight Leg Raise with External Rotation 10 reps;Left     Knee/Hip Exercises: Sidelying   Hip ABduction 10 reps   Clams x15                PT Education - 02/13/17 1151    Education provided Yes   Education Details anatomy of condition, POC, HEP, exercise form/rationale, gait with & wear of lift   Person(s) Educated Patient   Methods Explanation;Demonstration;Tactile cues;Verbal cues;Handout   Comprehension Verbalized understanding;Returned demonstration;Verbal  cues required;Tactile cues required;Need further instruction          PT Short Term Goals - 01/30/17 1515      PT SHORT TERM GOAL #1   Title GHJ flx & abd to 100 deg   Baseline see flowsheet   Time 3   Period Weeks   Status New   Target Date 02/22/17     PT SHORT TERM GOAL #2   Title 10 deg improvement in cervical flexion & rotation   Baseline see flowsheet   Time 3   Period Weeks   Status New   Target Date 02/22/17     PT SHORT TERM GOAL #3   Title .     PT SHORT TERM GOAL #4   Title .           PT Long Term Goals - 02/13/17 0958      PT LONG TERM GOAL #1   Title FOTO to 40% limitation to indicate significant improvement in functional ability   Baseline 51% limited at eval   Time 6   Period Weeks   Status New   Target Date 03/15/17     PT LONG TERM GOAL #2   Title Pt will be able to use L upper extremity for self care activities such as dressing/bathing with minimal discomfort   Baseline unable to use L arm at eval   Time 6   Period Weeks   Status New   Target Date 03/15/17     PT LONG TERM GOAL #3   Title Pt will be able to carry light grocery bags in L hand   Baseline does not carry anything in L hand   Time 6   Period Weeks   Status New   Target Date 03/15/17     PT LONG TERM GOAL #4   Title GHJ strength to gross 4+/5 for proper support to biomechanical chain   Baseline gross 3-/5 at eval   Time 6   Period Weeks   Status New   Target Date 03/15/17     PT LONG TERM GOAL #5   Title Pt will demo gross LLE strength 4+/5 for functional support to biomechanical chain   Baseline see flowsheet   Time 4   Period Weeks   Status New   Target Date 03/15/17     Additional Long Term Goals   Additional Long Term Goals Yes  PT LONG TERM GOAL #6   Title pt will demo safe, controlled gait pattern for at least 200 ft for improved household ambulation, appropraite use of SPC   Baseline heavy weight bearing on cane with scissoring gait and poor  ability to maintain straight line    Time 4   Period Weeks   Status New   Target Date 03/15/17               Plan - 02/13/17 1012    Clinical Impression Statement Re-evaluation for gait pattern today. Pt with notable LLD (L longer) and was corrected with 3 layer lift in Right shoe. Pt was able to demo singificant improvement in ability to stand from her chair as well as balance in gait. Heavy emphasis on importance of not bearing weight through [facility]. pt reported feeling better after treatment today and was educated on wear of lift. Will cont to benefit from skilled PT to strengthen LE biomechanical chain and improve balance in gait.    PT Frequency 2x / week   PT Duration 4 weeks   PT Treatment/Interventions ADLs/Self Care Home Management;Cryotherapy;Electrical Stimulation;Iontophoresis 4mg /ml Dexamethasone;Functional mobility training;Ultrasound;Traction;Moist Heat;Therapeutic activities;Therapeutic exercise;Neuromuscular re-education;Patient/family education;Passive range of motion;Manual techniques;Dry needling;Taping;Gait training;Balance training   PT Next Visit Plan gross LE strength, periscapular strengthening-prone exercises   PT Home Exercise Plan upper trap & levator stretch, scapular retraction, tennis ball soft tissue mobilization; cervical retraction, rotation SNAG, supine GHJ flx with wand; LE SLR, abd, clam, bridge with ball   Consulted and Agree with Plan of Care Patient      Patient will benefit from skilled therapeutic intervention in order to improve the following deficits and impairments:  Decreased range of motion, Impaired UE functional use, Increased muscle spasms, Decreased activity tolerance, Pain, Improper body mechanics, Impaired flexibility, Decreased strength, Impaired sensation, Postural dysfunction, Abnormal gait, Difficulty walking, Decreased balance  Visit Diagnosis: Chronic left shoulder pain - Plan: PT plan of care cert/re-cert  Cervicalgia - Plan: PT  plan of care cert/re-cert  Muscle weakness (generalized) - Plan: PT plan of care cert/re-cert  Difficulty in walking, not elsewhere classified - Plan: PT plan of care cert/re-cert     Problem List Patient Active Problem List   Diagnosis Date Noted  . Post-operative state 12/11/2016  . CKD (chronic kidney disease) 10/29/2016  . Chest wall pain 10/15/2016  . Menorrhagia 08/27/2016  . Chronic pain syndrome 05/10/2016  . Uterine leiomyoma 02/16/2016  . Liver lesion 12/01/2015  . Situational depression 10/19/2015  . HTN (hypertension) 07/08/2015  . Routine health maintenance 07/08/2015  . Tobacco abuse 07/08/2015    Jean [name] C. Roshard Rezabek PT, DPT 02/13/17 12:01 PM   [facility] [address] [place], [place], [address] Phone: [phone]   Fax:  [phone]  Name: ELLISE KOVACK MRN: [id number] Date of Birth: 01-24-1966

## 2017-02-18 ENCOUNTER — Ambulatory Visit: Payer: Self-pay | Admitting: Physical Therapy

## 2017-02-18 VITALS — BP 173/114 | HR 60

## 2017-02-18 DIAGNOSIS — R262 Difficulty in walking, not elsewhere classified: Secondary | ICD-10-CM

## 2017-02-18 DIAGNOSIS — M25611 Stiffness of right shoulder, not elsewhere classified: Secondary | ICD-10-CM

## 2017-02-18 DIAGNOSIS — M79605 Pain in left leg: Secondary | ICD-10-CM

## 2017-02-18 DIAGNOSIS — M25552 Pain in left hip: Secondary | ICD-10-CM

## 2017-02-18 DIAGNOSIS — M25512 Pain in left shoulder: Principal | ICD-10-CM

## 2017-02-18 DIAGNOSIS — G8929 Other chronic pain: Secondary | ICD-10-CM

## 2017-02-18 DIAGNOSIS — M546 Pain in thoracic spine: Secondary | ICD-10-CM

## 2017-02-18 DIAGNOSIS — M6281 Muscle weakness (generalized): Secondary | ICD-10-CM

## 2017-02-18 DIAGNOSIS — R2689 Other abnormalities of gait and mobility: Secondary | ICD-10-CM

## 2017-02-18 DIAGNOSIS — M542 Cervicalgia: Secondary | ICD-10-CM

## 2017-02-18 NOTE — Therapy (Signed)
[place], [place], [address] Phone: [phone]   Fax:  [phone]  Physical Therapy Treatment  Patient Details  Name: Jean [name] MRN: [id number] Date of Birth: [birth year] Referring Provider: Abner Greenspan, MD  Encounter Date: 02/18/2017      PT End of Session - 02/18/17 [phone]    Visit Number --  arrived, no charge   Number of Visits 13   Date for PT Re-Evaluation 03/15/17   Authorization Type CAFA exp 04/24/17   PT Start Time 0932   PT Stop Time 0957   PT Time Calculation (min) 25 min      Past Medical History:  Diagnosis Date  . Arthritis    hip  . Asthma   . Bronchitis    states at least 2-3 months ago  . Carpal tunnel syndrome    bilateral  . Chronic lower back pain   . Depression   . Dyspnea    with exertion  . GERD (gastroesophageal reflux disease)   . Headache    migraines  . Herpes   . Hypertension   . SVD (spontaneous vaginal delivery)    x 2  . Uterine leiomyoma 02/16/2016    Past Surgical History:  Procedure Laterality Date  . SALPINGECTOMY     lap - ectopic preg  . TOTAL HIP ARTHROPLASTY Left 01/20/2016   Procedure: LEFT TOTAL HIP ARTHROPLASTY ANTERIOR APPROACH;  Surgeon: Mcarthur Rossetti, MD;  Location: [facility];  Service: Orthopedics;  Laterality: Left;  Marland Kitchen VAGINAL HYSTERECTOMY Right 12/11/2016   Procedure: HYSTERECTOMY VAGINAL WITH RIGHT SALPINGECTOMY;  Surgeon: Chancy Milroy, MD;  Location: [facility];  Service: Gynecology;  Laterality: Right;    Vitals:   02/18/17 0934 02/18/17 0957  BP: (!) 157/103 (!) 173/114  Pulse: (!) 56 60  SpO2: 99% 99%        Subjective Assessment - 02/18/17 0934    Subjective My knee went out and has hurt for 2 days. I can just put some weight on it today. I have been using the walker since last visit and the heel lift. My upper back pain is affecting my breathing even when not moving.    Currently in  Pain? Yes   Pain Score 8    Pain Location Back  and top of left shoulder    Pain Orientation Left;Lower;Upper   Pain Descriptors / Indicators Throbbing   Pain Radiating Towards left arm/ hand    Aggravating Factors  slouching affects breathing    Pain Relieving Factors good posture                                    PT Short Term Goals - 01/30/17 1515      PT SHORT TERM GOAL #1   Title GHJ flx & abd to 100 deg   Baseline see flowsheet   Time 3   Period Weeks   Status New   Target Date 02/22/17     PT SHORT TERM GOAL #2   Title 10 deg improvement in cervical flexion & rotation   Baseline see flowsheet   Time 3   Period Weeks   Status New   Target Date 02/22/17     PT SHORT TERM GOAL #3   Title .     PT SHORT TERM GOAL #4   Title .  PT Long Term Goals - 02/13/17 0958      PT LONG TERM GOAL #1   Title FOTO to 40% limitation to indicate significant improvement in functional ability   Baseline 51% limited at eval   Time 6   Period Weeks   Status New   Target Date 03/15/17     PT LONG TERM GOAL #2   Title Pt will be able to use L upper extremity for self care activities such as dressing/bathing with minimal discomfort   Baseline unable to use L arm at eval   Time 6   Period Weeks   Status New   Target Date 03/15/17     PT LONG TERM GOAL #3   Title Pt will be able to carry light grocery bags in L hand   Baseline does not carry anything in L hand   Time 6   Period Weeks   Status New   Target Date 03/15/17     PT LONG TERM GOAL #4   Title GHJ strength to gross 4+/5 for proper support to biomechanical chain   Baseline gross 3-/5 at eval   Time 6   Period Weeks   Status New   Target Date 03/15/17     PT LONG TERM GOAL #5   Title Pt will demo gross LLE strength 4+/5 for functional support to biomechanical chain   Baseline see flowsheet   Time 4   Period Weeks   Status New   Target Date 03/15/17     Additional  Long Term Goals   Additional Long Term Goals Yes     PT LONG TERM GOAL #6   Title pt will demo safe, controlled gait pattern for at least 200 ft for improved household ambulation, appropraite use of SPC   Baseline heavy weight bearing on cane with scissoring gait and poor ability to maintain straight line    Time 4   Period Weeks   Status New   Target Date 03/15/17               Plan - 02/18/17 0957    Clinical Impression Statement Pt arrives with RW. She reports pain in left  upper back that is affecting her breathing. She reports SOB, SPo2 99%  HR 60, BP 173/114. She also reports intermittent dizziness and lack of balance when standing. She report heaviness in left arm and left hand intermittent N/T. She also reports feeling "something off" on left side of face and left eye. no facial drooping noted. She has BP monitor at home but has not taken BP over last several days and reports feeling funny. She reports history of blood clot. Recommended pt fo to Emergency Room. At first she declined and then said her friend in the lobby will drive her to the emergency room. Discontinued PT treatment due to patient signs and symptoms.    PT Next Visit Plan gross LE strength, periscapular strengthening-prone exercises   PT Home Exercise Plan upper trap & levator stretch, scapular retraction, tennis ball soft tissue mobilization; cervical retraction, rotation SNAG, supine GHJ flx with wand; LE SLR, abd, clam, bridge with ball   Consulted and Agree with Plan of Care Patient      Patient will benefit from skilled therapeutic intervention in order to improve the following deficits and impairments:  Decreased range of motion, Impaired UE functional use, Increased muscle spasms, Decreased activity tolerance, Pain, Improper body mechanics, Impaired flexibility, Decreased strength, Impaired sensation, Postural dysfunction, Abnormal gait, Difficulty  walking, Decreased balance  Visit Diagnosis: Chronic  left shoulder pain  Cervicalgia  Muscle weakness (generalized)  Difficulty in walking, not elsewhere classified  Stiffness of right shoulder, not elsewhere classified  Other abnormalities of gait and mobility  Pain in left hip  Pain in thoracic spine  Pain in left leg     Problem List Patient Active Problem List   Diagnosis Date Noted  . Post-operative state 12/11/2016  . CKD (chronic kidney disease) 10/29/2016  . Chest wall pain 10/15/2016  . Menorrhagia 08/27/2016  . Chronic pain syndrome 05/10/2016  . Uterine leiomyoma 02/16/2016  . Liver lesion 12/01/2015  . Situational depression 10/19/2015  . HTN (hypertension) 07/08/2015  . Routine health maintenance 07/08/2015  . Tobacco abuse 07/08/2015    Dorene Ar, PTA 02/18/2017, 10:08 AM  [place], [place], [address] Phone: [phone]   Fax:  [phone]  Name: CHERRELL MAYBEE MRN: [id number] Date of Birth: Apr 11, 1966

## 2017-02-20 ENCOUNTER — Ambulatory Visit: Payer: Self-pay | Admitting: Physical Therapy

## 2017-02-25 ENCOUNTER — Ambulatory Visit: Payer: Self-pay | Admitting: Physical Therapy

## 2017-02-25 ENCOUNTER — Telehealth: Payer: Self-pay | Admitting: Physical Therapy

## 2017-02-25 NOTE — Telephone Encounter (Signed)
Attempted to call patient regarding no-show to appointment this morning. Phone is unable to receive calls.

## 2017-02-27 ENCOUNTER — Ambulatory Visit: Payer: Self-pay | Admitting: Physical Therapy

## 2017-02-27 ENCOUNTER — Telehealth: Payer: Self-pay | Admitting: Physical Therapy

## 2017-02-27 NOTE — Telephone Encounter (Signed)
Left message on pt cell number regarding no show today. Per policy, due to two no show visits,  will leave next appointment but pt will then be able to schedule one at a time.   Jean [name] C. Edin Kon PT, DPT 02/27/17 10:00 AM

## 2017-03-04 ENCOUNTER — Ambulatory Visit: Payer: Self-pay | Admitting: Physical Therapy

## 2017-03-04 VITALS — BP 142/98 | HR 62

## 2017-03-04 DIAGNOSIS — M542 Cervicalgia: Secondary | ICD-10-CM

## 2017-03-04 DIAGNOSIS — M25611 Stiffness of right shoulder, not elsewhere classified: Secondary | ICD-10-CM

## 2017-03-04 DIAGNOSIS — M25512 Pain in left shoulder: Principal | ICD-10-CM

## 2017-03-04 DIAGNOSIS — M6281 Muscle weakness (generalized): Secondary | ICD-10-CM

## 2017-03-04 DIAGNOSIS — R262 Difficulty in walking, not elsewhere classified: Secondary | ICD-10-CM

## 2017-03-04 DIAGNOSIS — G8929 Other chronic pain: Secondary | ICD-10-CM

## 2017-03-04 DIAGNOSIS — R2689 Other abnormalities of gait and mobility: Secondary | ICD-10-CM

## 2017-03-04 NOTE — Therapy (Signed)
[place], [place], [address] Phone: [phone]   Fax:  [phone]  Physical Therapy Treatment  Patient Details  Name: Jean [name] MRN: [id number] Date of Birth: 17-Oct-1965 Referring Provider: Abner Greenspan, MD  Encounter Date: 03/04/2017      PT End of Session - 03/04/17 0943    Visit Number 5   Number of Visits 13   Date for PT Re-Evaluation 03/15/17   Authorization Type CAFA exp 04/24/17   PT Start Time 0930   PT Stop Time 1015   PT Time Calculation (min) 45 min      Past Medical History:  Diagnosis Date  . Arthritis    hip  . Asthma   . Bronchitis    states at least 2-3 months ago  . Carpal tunnel syndrome    bilateral  . Chronic lower back pain   . Depression   . Dyspnea    with exertion  . GERD (gastroesophageal reflux disease)   . Headache    migraines  . Herpes   . Hypertension   . SVD (spontaneous vaginal delivery)    x 2  . Uterine leiomyoma 02/16/2016    Past Surgical History:  Procedure Laterality Date  . SALPINGECTOMY     lap - ectopic preg  . TOTAL HIP ARTHROPLASTY Left 01/20/2016   Procedure: LEFT TOTAL HIP ARTHROPLASTY ANTERIOR APPROACH;  Surgeon: Mcarthur Rossetti, MD;  Location: [facility];  Service: Orthopedics;  Laterality: Left;  Marland Kitchen VAGINAL HYSTERECTOMY Right 12/11/2016   Procedure: HYSTERECTOMY VAGINAL WITH RIGHT SALPINGECTOMY;  Surgeon: Chancy Milroy, MD;  Location: [facility];  Service: Gynecology;  Laterality: Right;    Vitals:   03/04/17 0932  BP: (!) 142/98  Pulse: 62  SpO2: 99%        Subjective Assessment - 03/04/17 0932    Subjective My neck is fine. I want to work on the numbness in my arm and hand.    Currently in Pain? Yes   Pain Score 7    Pain Location Back   Pain Orientation Upper   Pain Descriptors / Indicators --  just there    Pain Radiating Towards left arm and hand             [facility] PT  Assessment - 03/04/17 0001      AROM   Left Shoulder Flexion 115 Degrees   Left Shoulder ABduction 110 Degrees   Cervical Flexion 30   Cervical Extension 25   Cervical - Right Rotation 50   Cervical - Left Rotation 55     Strength   Strength Assessment Site Hand   Right/Left hand Right;Left   Right Hand Grip (lbs) --  02,54,27   Left Hand Gross Grasp --  26,24,30                     [facility] Adult PT Treatment/Exercise - 03/04/17 0001      Shoulder Exercises: Supine   Other Supine Exercises cervical retraction   Other Supine Exercises supine cane press ups and pllovers , ER, horizontals      Shoulder Exercises: Seated   Retraction Limitations scapular retraction     Shoulder Exercises: Pulleys   Flexion 2 minutes     Modalities   Modalities Traction     Traction   Type of Traction Cervical   Min (lbs) 14   Max (lbs) 7   Hold Time 60  Rest Time 10   Time 10  prefers only slight or no leg elevation     Manual Therapy   Manual therapy comments cervical distraction                   PT Short Term Goals - 03/04/17 0937      PT SHORT TERM GOAL #1   Title GHJ flx & abd to 100 deg   Baseline see flowsheet   Time 3   Period Weeks   Status Achieved     PT SHORT TERM GOAL #2   Title 10 deg improvement in cervical flexion & rotation   Baseline see flowsheet   Time 3   Period Weeks   Status Partially Met           PT Long Term Goals - 02/13/17 1287      PT LONG TERM GOAL #1   Title FOTO to 40% limitation to indicate significant improvement in functional ability   Baseline 51% limited at eval   Time 6   Period Weeks   Status New   Target Date 03/15/17     PT LONG TERM GOAL #2   Title Pt will be able to use L upper extremity for self care activities such as dressing/bathing with minimal discomfort   Baseline unable to use L arm at eval   Time 6   Period Weeks   Status New   Target Date 03/15/17     PT LONG TERM GOAL #3    Title Pt will be able to carry light grocery bags in L hand   Baseline does not carry anything in L hand   Time 6   Period Weeks   Status New   Target Date 03/15/17     PT LONG TERM GOAL #4   Title GHJ strength to gross 4+/5 for proper support to biomechanical chain   Baseline gross 3-/5 at eval   Time 6   Period Weeks   Status New   Target Date 03/15/17     PT LONG TERM GOAL #5   Title Pt will demo gross LLE strength 4+/5 for functional support to biomechanical chain   Baseline see flowsheet   Time 4   Period Weeks   Status New   Target Date 03/15/17     Additional Long Term Goals   Additional Long Term Goals Yes     PT LONG TERM GOAL #6   Title pt will demo safe, controlled gait pattern for at least 200 ft for improved household ambulation, appropraite use of SPC   Baseline heavy weight bearing on cane with scissoring gait and poor ability to maintain straight line    Time 4   Period Weeks   Status New   Target Date 03/15/17               Plan - 03/04/17 8676    Clinical Impression Statement Pt arrives with reports of her mother in ICU which kept her from attending her last 2 appts. We reviewed the attendance policy and she will schedule one appt at a time going forward. Her BP is lower today at 142/98 which is must less than her last visit. She missed her MD appt due to caring for her mother. She plans to go to the MD office today. I asked her to get clarification on her BP meds. Pt requested to work on arm numbness today. Trial of cervical distraction today; she reports decreased arm  symptoms. Trial of mechanical traction also with pt unable to tolerate lying with feet up due to increased pressure on upper back. last few minutes performed with pt supine. She reported less N/T in hand after traction. Shoulder and neck ROM improved. STG# 1 MET and #2 partially met.    PT Next Visit Plan assess benefit of traction, take BP, gross LE strength, periscapular  strengthening-prone exercises   PT Home Exercise Plan upper trap & levator stretch, scapular retraction, tennis ball soft tissue mobilization; cervical retraction, rotation SNAG, supine GHJ flx with wand; LE SLR, abd, clam, bridge with ball   Consulted and Agree with Plan of Care Patient      Patient will benefit from skilled therapeutic intervention in order to improve the following deficits and impairments:  Decreased range of motion, Impaired UE functional use, Increased muscle spasms, Decreased activity tolerance, Pain, Improper body mechanics, Impaired flexibility, Decreased strength, Impaired sensation, Postural dysfunction, Abnormal gait, Difficulty walking, Decreased balance  Visit Diagnosis: Chronic left shoulder pain  Cervicalgia  Muscle weakness (generalized)  Difficulty in walking, not elsewhere classified  Stiffness of right shoulder, not elsewhere classified  Other abnormalities of gait and mobility     Problem List Patient Active Problem List   Diagnosis Date Noted  . Post-operative state 12/11/2016  . CKD (chronic kidney disease) 10/29/2016  . Chest wall pain 10/15/2016  . Menorrhagia 08/27/2016  . Chronic pain syndrome 05/10/2016  . Uterine leiomyoma 02/16/2016  . Liver lesion 12/01/2015  . Situational depression 10/19/2015  . HTN (hypertension) 07/08/2015  . Routine health maintenance 07/08/2015  . Tobacco abuse 07/08/2015    Dorene Ar, PTA 03/04/2017, 10:36 AM  [facility] [address] [place], [place], [address] Phone: [phone]   Fax:  [phone]  Name: Jean [name] MRN: [id number] Date of Birth: 04/14/1966

## 2017-03-06 ENCOUNTER — Encounter: Payer: Self-pay | Admitting: Physical Therapy

## 2017-03-06 ENCOUNTER — Ambulatory Visit: Payer: Self-pay | Admitting: Physical Therapy

## 2017-03-06 DIAGNOSIS — M25611 Stiffness of right shoulder, not elsewhere classified: Secondary | ICD-10-CM

## 2017-03-06 DIAGNOSIS — M25552 Pain in left hip: Secondary | ICD-10-CM

## 2017-03-06 DIAGNOSIS — M25512 Pain in left shoulder: Principal | ICD-10-CM

## 2017-03-06 DIAGNOSIS — R262 Difficulty in walking, not elsewhere classified: Secondary | ICD-10-CM

## 2017-03-06 DIAGNOSIS — G8929 Other chronic pain: Secondary | ICD-10-CM

## 2017-03-06 DIAGNOSIS — M546 Pain in thoracic spine: Secondary | ICD-10-CM

## 2017-03-06 DIAGNOSIS — R2689 Other abnormalities of gait and mobility: Secondary | ICD-10-CM

## 2017-03-06 DIAGNOSIS — M79605 Pain in left leg: Secondary | ICD-10-CM

## 2017-03-06 DIAGNOSIS — M6281 Muscle weakness (generalized): Secondary | ICD-10-CM

## 2017-03-06 DIAGNOSIS — M542 Cervicalgia: Secondary | ICD-10-CM

## 2017-03-06 NOTE — Therapy (Addendum)
[place], [place], [address] Phone: [phone]   Fax:  [phone]  Physical Therapy Treatment/Discharge Summary  Patient Details  Name: Jean [name] MRN: [id number] Date of Birth: [birth year] Referring Provider: Abner Greenspan, MD  Encounter Date: 03/06/2017      PT End of Session - 03/06/17 1251    Visit Number 6   Number of Visits 13   Date for PT Re-Evaluation 03/15/17   Authorization Type CAFA exp 04/24/17   PT Start Time 1250   PT Stop Time 1333   PT Time Calculation (min) 43 min      Past Medical History:  Diagnosis Date  . Arthritis    hip  . Asthma   . Bronchitis    states at least 2-3 months ago  . Carpal tunnel syndrome    bilateral  . Chronic lower back pain   . Depression   . Dyspnea    with exertion  . GERD (gastroesophageal reflux disease)   . Headache    migraines  . Herpes   . Hypertension   . SVD (spontaneous vaginal delivery)    x 2  . Uterine leiomyoma 02/16/2016    Past Surgical History:  Procedure Laterality Date  . SALPINGECTOMY     lap - ectopic preg  . TOTAL HIP ARTHROPLASTY Left 01/20/2016   Procedure: LEFT TOTAL HIP ARTHROPLASTY ANTERIOR APPROACH;  Surgeon: Mcarthur Rossetti, MD;  Location: [facility];  Service: Orthopedics;  Laterality: Left;  Marland Kitchen VAGINAL HYSTERECTOMY Right 12/11/2016   Procedure: HYSTERECTOMY VAGINAL WITH RIGHT SALPINGECTOMY;  Surgeon: Chancy Milroy, MD;  Location: [facility];  Service: Gynecology;  Laterality: Right;    There were no vitals filed for this visit.      Subjective Assessment - 03/06/17 1250    Subjective I was able to use my hand a little more after traction last visit.    Currently in Pain? Yes   Pain Score 9    Pain Location Back   Pain Orientation Upper                         [facility] Adult PT Treatment/Exercise - 03/06/17 0001      Self-Care   Self-Care Other Self-Care  Comments   Other Self-Care Comments  use of theracane and tennis ball for self trigger point release      Manual Therapy   Manual therapy comments manual cervical distraction, PROM side bend, rotation and levator stretch   Soft tissue mobilization trigger point release to left upper trap and left periscapular muscles                   PT Short Term Goals - 03/04/17 0937      PT SHORT TERM GOAL #1   Title GHJ flx & abd to 100 deg   Baseline see flowsheet   Time 3   Period Weeks   Status Achieved     PT SHORT TERM GOAL #2   Title 10 deg improvement in cervical flexion & rotation   Baseline see flowsheet   Time 3   Period Weeks   Status Partially Met           PT Long Term Goals - 02/13/17 1448      PT LONG TERM GOAL #1   Title FOTO to 40% limitation to indicate significant improvement in functional ability   Baseline 51%  limited at eval   Time 6   Period Weeks   Status New   Target Date 03/15/17     PT LONG TERM GOAL #2   Title Pt will be able to use L upper extremity for self care activities such as dressing/bathing with minimal discomfort   Baseline unable to use L arm at eval   Time 6   Period Weeks   Status New   Target Date 03/15/17     PT LONG TERM GOAL #3   Title Pt will be able to carry light grocery bags in L hand   Baseline does not carry anything in L hand   Time 6   Period Weeks   Status New   Target Date 03/15/17     PT LONG TERM GOAL #4   Title GHJ strength to gross 4+/5 for proper support to biomechanical chain   Baseline gross 3-/5 at eval   Time 6   Period Weeks   Status New   Target Date 03/15/17     PT LONG TERM GOAL #5   Title Pt will demo gross LLE strength 4+/5 for functional support to biomechanical chain   Baseline see flowsheet   Time 4   Period Weeks   Status New   Target Date 03/15/17     Additional Long Term Goals   Additional Long Term Goals Yes     PT LONG TERM GOAL #6   Title pt will demo safe,  controlled gait pattern for at least 200 ft for improved household ambulation, appropraite use of SPC   Baseline heavy weight bearing on cane with scissoring gait and poor ability to maintain straight line    Time 4   Period Weeks   Status New   Target Date 03/15/17               Plan - 03/06/17 1340    Clinical Impression Statement Pt reports 9/10 pain upon arrival. She did note increased function with LUE after traction last visit but is unale to tolerate positioning today due to increased pain. In sidelying, performed manual triggerpoint release to left periscapular muscles and upper traps, left cervical paraspinals. Also performed manual cervical distraction left upper trap TPR  and PROM in supine for a short period of time due to discomfort lying on back. Used theracan to simulate self trigger point release as well as instructed pt how to use tennis ball in scapular area against wall. Pt reports pain much better at end of treatment and rates it as 7/10.    PT Next Visit Plan assess benefit of traction, manual trigger point release. take BP, gross LE strength, periscapular strengthening-prone exercises   PT Home Exercise Plan upper trap & levator stretch, scapular retraction, tennis ball soft tissue mobilization; cervical retraction, rotation SNAG, supine GHJ flx with wand; LE SLR, abd, clam, bridge with ball   Consulted and Agree with Plan of Care Patient      Patient will benefit from skilled therapeutic intervention in order to improve the following deficits and impairments:  Decreased range of motion, Impaired UE functional use, Increased muscle spasms, Decreased activity tolerance, Pain, Improper body mechanics, Impaired flexibility, Decreased strength, Impaired sensation, Postural dysfunction, Abnormal gait, Difficulty walking, Decreased balance  Visit Diagnosis: Chronic left shoulder pain  Cervicalgia  Muscle weakness (generalized)  Difficulty in walking, not elsewhere  classified  Stiffness of right shoulder, not elsewhere classified  Other abnormalities of gait and mobility  Pain in left hip  Pain in thoracic spine  Pain in left leg     Problem List Patient Active Problem List   Diagnosis Date Noted  . Post-operative state 12/11/2016  . CKD (chronic kidney disease) 10/29/2016  . Chest wall pain 10/15/2016  . Menorrhagia 08/27/2016  . Chronic pain syndrome 05/10/2016  . Uterine leiomyoma 02/16/2016  . Liver lesion 12/01/2015  . Situational depression 10/19/2015  . HTN (hypertension) 07/08/2015  . Routine health maintenance 07/08/2015  . Tobacco abuse 07/08/2015    Dorene Ar, PTA 03/06/2017, 1:47 PM  [facility] [address] [place], [place], [address] Phone: [phone]   Fax:  [phone]  Name: Jean [name] MRN: [id number] Date of Birth: 1965/05/27  PHYSICAL THERAPY DISCHARGE SUMMARY  Visits from Start of Care: 6  Current functional level related to goals / functional outcomes: See above   Remaining deficits: See above   Education / Equipment: Anatomy of condition, POC, HEP, exercise form/rationale  Plan: Patient agrees to discharge.  Patient goals were not met. Patient is being discharged due to not returning since the last visit.  ?????     Reaghan Kawa C. Hightower PT, DPT 04/10/17 5:22 PM

## 2017-03-09 ENCOUNTER — Emergency Department (HOSPITAL_COMMUNITY)
Admission: EM | Admit: 2017-03-09 | Discharge: 2017-03-09 | Discharge status: Against Medical Advice | Payer: Self-pay | Attending: Emergency Medicine | Admitting: Emergency Medicine

## 2017-03-09 DIAGNOSIS — R030 Elevated blood-pressure reading, without diagnosis of hypertension: Secondary | ICD-10-CM | POA: Insufficient documentation

## 2017-03-09 DIAGNOSIS — Z5321 Procedure and treatment not carried out due to patient leaving prior to being seen by health care provider: Secondary | ICD-10-CM | POA: Insufficient documentation

## 2017-03-09 NOTE — ED Notes (Addendum)
Pt only wanted BP checked.  Encouraged pt to stay and see MD.  EMT gave pt BP results.  Advised to return to ED for any concerns.

## 2017-03-09 NOTE — ED Notes (Signed)
Pt states she just want her b/p checked and does not want to be soon triage RN aware

## 2017-03-11 ENCOUNTER — Encounter: Payer: Self-pay | Admitting: Physical Therapy

## 2017-03-13 ENCOUNTER — Encounter: Payer: Self-pay | Admitting: Physical Therapy

## 2017-03-13 ENCOUNTER — Ambulatory Visit: Payer: Self-pay | Attending: Physician Assistant | Admitting: Physical Therapy

## 2017-03-18 ENCOUNTER — Emergency Department (HOSPITAL_COMMUNITY): Payer: Self-pay

## 2017-03-18 ENCOUNTER — Other Ambulatory Visit: Payer: Self-pay

## 2017-03-18 ENCOUNTER — Emergency Department (HOSPITAL_COMMUNITY)
Admission: EM | Admit: 2017-03-18 | Discharge: 2017-03-18 | Disposition: A | Discharge status: Home / Self Care | Payer: Self-pay | Attending: Emergency Medicine | Admitting: Emergency Medicine

## 2017-03-18 ENCOUNTER — Encounter (HOSPITAL_COMMUNITY): Payer: Self-pay

## 2017-03-18 DIAGNOSIS — J45909 Unspecified asthma, uncomplicated: Secondary | ICD-10-CM | POA: Insufficient documentation

## 2017-03-18 DIAGNOSIS — N189 Chronic kidney disease, unspecified: Secondary | ICD-10-CM | POA: Insufficient documentation

## 2017-03-18 DIAGNOSIS — R05 Cough: Secondary | ICD-10-CM | POA: Insufficient documentation

## 2017-03-18 DIAGNOSIS — I129 Hypertensive chronic kidney disease with stage 1 through stage 4 chronic kidney disease, or unspecified chronic kidney disease: Secondary | ICD-10-CM | POA: Insufficient documentation

## 2017-03-18 DIAGNOSIS — R0989 Other specified symptoms and signs involving the circulatory and respiratory systems: Secondary | ICD-10-CM | POA: Insufficient documentation

## 2017-03-18 LAB — CBC
HCT: 38.8 % (ref 36.0–46.0)
Hemoglobin: 12.8 g/dL (ref 12.0–15.0)
MCH: 32.3 pg (ref 26.0–34.0)
MCHC: 33 g/dL (ref 30.0–36.0)
MCV: 98 fL (ref 78.0–100.0)
Platelets: 334 10*3/uL (ref 150–400)
RBC: 3.96 MIL/uL (ref 3.87–5.11)
RDW: 13.5 % (ref 11.5–15.5)
WBC: 8.3 10*3/uL (ref 4.0–10.5)

## 2017-03-18 LAB — BASIC METABOLIC PANEL
Anion gap: 8 (ref 5–15)
BUN: 10 mg/dL (ref 6–20)
CO2: 25 mmol/L (ref 22–32)
Calcium: 9.4 mg/dL (ref 8.9–10.3)
Chloride: 103 mmol/L (ref 101–111)
Creatinine, Ser: 1.09 mg/dL — ABNORMAL HIGH (ref 0.44–1.00)
GFR calc Af Amer: >60 mL/min (ref >60)
GFR calc non Af Amer: 57 mL/min — ABNORMAL LOW (ref >60)
Glucose, Bld: 99 mg/dL (ref 65–99)
Potassium: 3.7 mmol/L (ref 3.5–5.1)
Sodium: 136 mmol/L (ref 135–145)

## 2017-03-18 LAB — I-STAT TROPONIN, ED: Troponin i, poc: 0.01 ng/mL (ref 0.00–0.08)

## 2017-03-18 MED ORDER — ALBUTEROL SULFATE HFA 108 (90 BASE) MCG/ACT IN AERS
2.0000 | INHALATION_SPRAY | RESPIRATORY_TRACT | Status: DC | PRN
  Filled 2017-03-18: qty 6.7

## 2017-03-18 MED ORDER — BENZONATATE 100 MG PO CAPS: 200.0000 mg | ORAL_CAPSULE | Freq: Two times a day (BID) | ORAL | 0 refills | Status: DC | PRN

## 2017-03-18 MED ORDER — IPRATROPIUM-ALBUTEROL 0.5-2.5 (3) MG/3ML IN SOLN
3.0000 mL | Freq: Once | RESPIRATORY_TRACT | Status: AC
  Administered 2017-03-18: 3 mL via RESPIRATORY_TRACT
  Filled 2017-03-18: qty 3

## 2017-03-18 NOTE — ED Provider Notes (Signed)
[facility] EMERGENCY DEPARTMENT Provider Note   CSN: [id number] Arrival date & time: 03/18/17  1827     History   Chief Complaint Chief Complaint  Patient presents with  . Chest Pain    HPI Jean [name] is a 51 y.o. female.  Patient presents to the emergency department with chief complaint of chest tightness.  She states the symptoms started yesterday.  She reports associated productive cough.  She denies any fevers or chills.  She does use cocaine, but has not used any in the past 2 weeks.  States that she has been under a lot of stress but states that she just buried her mother over the weekend.  She has not taken anything for symptoms.  He states that she has chronic radiating pain down the left arm from an old radiculopathy.  She denies any changes to this.   The history is provided by the patient. No language interpreter was used.    Past Medical History:  Diagnosis Date  . Arthritis    hip  . Asthma   . Bronchitis    states at least 2-3 months ago  . Carpal tunnel syndrome    bilateral  . Chronic lower back pain   . Depression   . Dyspnea    with exertion  . GERD (gastroesophageal reflux disease)   . Headache    migraines  . Herpes   . Hypertension   . SVD (spontaneous vaginal delivery)    x 2  . Uterine leiomyoma 02/16/2016    Patient Active Problem List   Diagnosis Date Noted  . Post-operative state 12/11/2016  . CKD (chronic kidney disease) 10/29/2016  . Chest wall pain 10/15/2016  . Menorrhagia 08/27/2016  . Chronic pain syndrome 05/10/2016  . Uterine leiomyoma 02/16/2016  . Liver lesion 12/01/2015  . Situational depression 10/19/2015  . HTN (hypertension) 07/08/2015  . Routine health maintenance 07/08/2015  . Tobacco abuse 07/08/2015    Past Surgical History:  Procedure Laterality Date  . SALPINGECTOMY     lap - ectopic preg    OB History    Gravida Para Term Preterm AB Living   3 1 1   2 1    SAB TAB Ectopic  Multiple Live Births     1 1           Home Medications    Prior to Admission medications   Medication Sig Start Date End Date Taking? Authorizing Provider  albuterol (PROVENTIL HFA;VENTOLIN HFA) 108 (90 Base) MCG/ACT inhaler Inhale 2 puffs into the lungs every 6 (six) hours as needed for wheezing or shortness of breath. 02/05/17   Thomasene Ripple, MD  cyclobenzaprine (FLEXERIL) 10 MG tablet Take 0.5 tablets (5 mg total) by mouth 2 (two) times daily as needed for muscle spasms. 02/05/17   Thomasene Ripple, MD  gabapentin (NEURONTIN) 100 MG capsule Take 1 capsule (100 mg total) by mouth 3 (three) times daily. 01/30/17   Thomasene Ripple, MD  hydrochlorothiazide (HYDRODIURIL) 25 MG tablet Take 1 tablet (25 mg total) by mouth daily. 01/30/17   Thomasene Ripple, MD  ibuprofen (ADVIL,MOTRIN) 600 MG tablet Take 1 tablet (600 mg total) by mouth every 6 (six) hours as needed. 02/05/17   Thomasene Ripple, MD  ranitidine (ZANTAC) 150 MG tablet Take 1 tablet (150 mg total) by mouth 2 (two) times daily. 02/01/17   Thomasene Ripple, MD  sertraline (ZOLOFT) 50 MG tablet Take 1 tablet (50 mg total) by mouth daily. 01/30/17 01/30/18  Thomasene Ripple, MD    Family History Family History  Problem Relation Age of Onset  . Cancer Mother   . Hypertension Mother   . Diabetes Mother   . Stroke Father     Social History Social History   Tobacco Use  . Smoking status: Former Smoker    Packs/day: 0.25    Years: 11.00    Pack years: 2.75    Types: Cigarettes    Last attempt to quit: 11/28/2016    Years since quitting: 0.3  . Smokeless tobacco: Never Used  Substance Use Topics  . Alcohol use: Yes    Alcohol/week: 1.2 oz    Types: 2 Cans of beer per week  . Drug use: Yes    Types: "Crack" cocaine, Cocaine    Comment: Last used one week ago      Allergies   Patient has no known allergies.   Review of Systems Review of Systems  All other systems reviewed and are negative.    Physical  Exam Updated Vital Signs BP (!) 154/91 (BP Location: Right Arm)   Pulse 93   Temp 98.9 F (37.2 C) (Oral)   Resp 18   Ht 5\' 3"  (1.6 m)   Wt 63.5 kg (140 lb)   LMP 12/08/2016 (Approximate)   SpO2 100%   BMI 24.80 kg/m   Physical Exam  Constitutional: She is oriented to person, place, and time. She appears well-developed and well-nourished.  HENT:  Head: Normocephalic and atraumatic.  Eyes: Conjunctivae and EOM are normal. Pupils are equal, round, and reactive to light.  Neck: Normal range of motion. Neck supple.  Cardiovascular: Normal rate and regular rhythm. Exam reveals no gallop and no friction rub.  No murmur heard. Pulmonary/Chest: Effort normal and breath sounds normal. No respiratory distress. She has no wheezes. She has no rales. She exhibits no tenderness.  Abdominal: Soft. Bowel sounds are normal. She exhibits no distension and no mass. There is no tenderness. There is no rebound and no guarding.  Musculoskeletal: Normal range of motion. She exhibits no edema or tenderness.  Neurological: She is alert and oriented to person, place, and time.  Skin: Skin is warm and dry.  Psychiatric: She has a normal mood and affect. Her behavior is normal. Judgment and thought content normal.  Nursing note and vitals reviewed.    ED Treatments / Results  Labs (all labs ordered are listed, but only abnormal results are displayed) Labs Reviewed  BASIC METABOLIC PANEL - Abnormal; Notable for the following components:      Result Value   Creatinine, Ser 1.09 (*)    GFR calc non Af Amer 57 (*)    All other components within normal limits  CBC  I-STAT TROPONIN, ED    EKG  EKG Interpretation None       Radiology Dg Chest 2 View  Result Date: 03/18/2017 CLINICAL DATA:  51 year old female with history of left-sided chest pain radiating into the left arm. Cough with yellow sputum production. Some chest congestion. Shortness of breath. EXAM: CHEST  2 VIEW COMPARISON:  Chest  x-ray 10/15/2016. FINDINGS: Lung volumes are normal. No consolidative airspace disease. No pleural effusions. No pneumothorax. No pulmonary nodule or mass noted. Pulmonary vasculature and the cardiomediastinal silhouette are within normal limits. IMPRESSION: No radiographic evidence of acute cardiopulmonary disease. Electronically Signed   By: Vinnie Langton M.D.   On: 03/18/2017 19:41    Procedures Procedures (including critical care time)  Medications Ordered in ED Medications  ipratropium-albuterol (DUONEB) 0.5-2.5 (3) MG/3ML nebulizer solution 3 mL (not administered)     Initial Impression / Assessment and Plan / ED Course  I have reviewed the triage vital signs and the nursing notes.  Pertinent labs & imaging results that were available during my care of the patient were reviewed by me and considered in my medical decision making (see chart for details).    Patient with chest congestion/tightness for the past 2 days.  Denies any fevers or chills.  Does report some productive cough.  Chest x-ray is negative.  No evidence of pneumonia.  No ischemic changes on EKG.  Labs reassuring.  We will treat with breathing treatment.  Plan discharged home with MDI and Tessalon Perles.  Final Clinical Impressions(s) / ED Diagnoses   Final diagnoses:  Chest congestion    ED Discharge Orders        Ordered    benzonatate (TESSALON) 100 MG capsule  2 times daily PRN     03/18/17 2211       Montine Circle, PA-C 03/18/17 2217    Virgel Manifold, MD 03/19/17 1036

## 2017-03-18 NOTE — ED Triage Notes (Signed)
Per Pt, Pt is coming from home with complaints of left sided chest pain that radiates down her arm. Pt reports cough with yellow sputum and some congestion. SOB. Denies N/V/D. Hx of Cocaine use.

## 2017-04-08 ENCOUNTER — Encounter: Payer: Self-pay | Admitting: Internal Medicine

## 2017-04-15 ENCOUNTER — Ambulatory Visit: Payer: Self-pay

## 2017-04-15 ENCOUNTER — Ambulatory Visit (INDEPENDENT_AMBULATORY_CARE_PROVIDER_SITE_OTHER): Payer: Self-pay | Admitting: Orthopaedic Surgery

## 2017-04-15 ENCOUNTER — Ambulatory Visit: Payer: Self-pay | Admitting: Sports Medicine

## 2017-04-17 ENCOUNTER — Encounter: Payer: Self-pay | Admitting: Internal Medicine

## 2017-04-17 ENCOUNTER — Ambulatory Visit (INDEPENDENT_AMBULATORY_CARE_PROVIDER_SITE_OTHER): Payer: Self-pay | Admitting: Sports Medicine

## 2017-04-17 ENCOUNTER — Encounter: Payer: Self-pay | Admitting: Sports Medicine

## 2017-04-17 VITALS — BP 138/90 | Ht 63.0 in | Wt 140.0 lb

## 2017-04-17 DIAGNOSIS — M5410 Radiculopathy, site unspecified: Secondary | ICD-10-CM

## 2017-04-17 MED ORDER — GABAPENTIN 300 MG PO CAPS: 300.0000 mg | ORAL_CAPSULE | Freq: Every day | ORAL | 1 refills | Status: DC

## 2017-04-17 MED FILL — GABAPENTIN 300 MG CAPSULE: 300 | 30 days supply | Qty: 30 | Fill #0

## 2017-04-17 NOTE — Progress Notes (Signed)
Chief complaint: Left shoulder and arm pain, numbness, and tingling 6 weeks  History of present illness: Jean [name] is a 51 year old right-hand dominant female who presents to the sports medicine office today with chief complaint of left shoulder and arm pain, numbness, and tingling. She reports that symptoms started approximately 6 weeks ago. She reports that she was in her third session of physical therapy for strengthening of her right lower extremity and her right hip when she started noticing the pain in her left shoulder. She points to pain in the posterior aspect of her left shoulder, radiating down to left deltoid and down to her elbow, hands, and all of her fingers. She describes the pain as a sharp pain and a stabbing pain. She reports any type of left shoulder abduction are aggravating factors. She does report feeling slightly weak in the left shoulder secondary to pain. She reports having slightly decreased grip strength secondary to pain. She does also report of pain and tightness in the left side of her neck. She does not report of any recent falls, injuries, or any other inciting factors otherwise. She was seen here back in August for bilateral knee pain, was felt to be secondary to right-sided compensation after her left hip was replaced last year. Earlier this year she was treated at [facility] for left shoulder impingement syndrome. She did have a subacromial bursal injection back in July of this year. Unfortunately, she did not report of any improvement after injection. Earlier this year, x-ray of her cervical spine was done, which did appear that she had some mild arthritic changes at C5 and C6. For pain, she reports that she has been taking ibuprofen 800 mg twice daily, as well as gabapentin 100 mg nightly. She does not report of any interval improvement in symptoms with these medications. She is currently using a walker for ambulation as the cane previously was not giving her as  much stability.  Review of systems:  As stated above  Interval past medical history, surgical history, family history, and social history obtained and unchanged.  Physical exam: Vital signs are reviewed and are documented in the chart Gen.: Alert, oriented, appears stated age, in no apparent distress, upon entering the room I do notice that she is up easily flexing and extending all of her fingers in both hands, more so in the left hand than the right, walker is in front of her HEENT: Moist oral mucosa Respiratory: Normal respirations, able to speak in full sentences Cardiac: Regular rate, distal pulses 2+ Integumentary: No rashes on visible skin:  Neurologic: She does have slightly decreased rotator cuff strength on the left side, which categorizes as 4+/5, otherwise strength 5/5, sensation 2+ in bilateral upper extremities, reflexes 2+ in bilateral upper extremities Psych: Normal affect, she does appear slightly anxious, mood is described as good Musculoskeletal: Inspection of left shoulder reveals no obvious deformity or muscle atrophy, no warmth, erythema, ecchymosis, or effusion, she is tender diffusely along the posterior shoulder over the trapezius and upper rhomboids, do feel some tightness in the trapezius over left side of neck, she does have full shoulder range of motion today, she does have pain with forward flexion when she gets past 120, pain with abduction getting past 60, and with external rotation past 45, rotator cuff impingement testing positive today with empty can, Neer's, cross arm, Hawkins, and O'Brien, Speed and Yergason negative, Spurling is positive for reproduction of left sided paresthesias  Assessment and plan: 1. Left-sided arm pain and  paresthesias, suspect cervical radiculopathy affecting C5-6  Plan: I did personally review her C-spine x-ray in the office today, which does show some slight decrease in joint space as well as slight arthritic changes involving  C5 and C6. I discussed with her that her left posterior shoulder pain is most likely referred from her neck, as I do feel that she does have cervical radiculopathy as the main culprit. I discussed it is likely her pain and weakness in her rotator cuff is also being referred from her neck, as she is having associated tightness and spasming of the trapezius and upper rhomboids. We'll go ahead and obtain MRI of her C-spine to rule out disc herniation as cause of left sided arm pain and paresthesias. I will call her after the MRI, with plan of care to be determined after that. For pain, I discussed discontinuing the ibuprofen 800 mg BID. Will increase gabapentin to 300 mg nightly. Discussed drowsiness as main side effect. Will continue to have her PT sessions. She does have appt with [facility] on 12/19. I discussed with her to keep that appt with them and to notify them on her ongoing symptoms and workup.   Mort Sawyers, M.D. [place]

## 2017-04-20 ENCOUNTER — Ambulatory Visit
Admission: RE | Admit: 2017-04-20 | Discharge: 2017-04-20 | Disposition: A | Discharge status: Home / Self Care | Payer: No Typology Code available for payment source | Source: Ambulatory Visit | Attending: Sports Medicine | Admitting: Sports Medicine

## 2017-04-20 DIAGNOSIS — M5410 Radiculopathy, site unspecified: Secondary | ICD-10-CM

## 2017-04-22 ENCOUNTER — Ambulatory Visit (INDEPENDENT_AMBULATORY_CARE_PROVIDER_SITE_OTHER): Payer: Self-pay | Admitting: Sports Medicine

## 2017-04-22 VITALS — BP 130/84 | Ht 63.0 in | Wt 140.0 lb

## 2017-04-22 DIAGNOSIS — M5010 Cervical disc disorder with radiculopathy, unspecified cervical region: Secondary | ICD-10-CM

## 2017-04-22 NOTE — Progress Notes (Signed)
  Patient comes in today to discuss MRI findings of her cervical spine. She has multilevel facet arthritis and multifocal degenerative changes which result in foraminal narrowing, particularly on the left. She has severe left C5 foraminal stenosis and severe left C7 and C8 foraminal stenosis. She has an appointment at [facility] on December 19. She will discuss these MRI findings with those physicians. She may benefit from epidural steroid injections but she may ultimately need surgical decompression and possible fusion. I'll defer further workup and treatment to the physicians at [facility]. Patient will follow-up with me as needed.

## 2017-04-24 ENCOUNTER — Encounter (INDEPENDENT_AMBULATORY_CARE_PROVIDER_SITE_OTHER): Payer: Self-pay | Admitting: Orthopaedic Surgery

## 2017-04-24 ENCOUNTER — Ambulatory Visit (INDEPENDENT_AMBULATORY_CARE_PROVIDER_SITE_OTHER): Payer: Self-pay | Admitting: Internal Medicine

## 2017-04-24 ENCOUNTER — Encounter: Payer: Self-pay | Admitting: Internal Medicine

## 2017-04-24 ENCOUNTER — Other Ambulatory Visit: Payer: Self-pay

## 2017-04-24 ENCOUNTER — Other Ambulatory Visit (INDEPENDENT_AMBULATORY_CARE_PROVIDER_SITE_OTHER): Payer: Self-pay

## 2017-04-24 ENCOUNTER — Ambulatory Visit (INDEPENDENT_AMBULATORY_CARE_PROVIDER_SITE_OTHER): Payer: Self-pay | Admitting: Orthopaedic Surgery

## 2017-04-24 DIAGNOSIS — R0609 Other forms of dyspnea: Secondary | ICD-10-CM

## 2017-04-24 DIAGNOSIS — M4802 Spinal stenosis, cervical region: Secondary | ICD-10-CM

## 2017-04-24 DIAGNOSIS — Z79899 Other long term (current) drug therapy: Secondary | ICD-10-CM

## 2017-04-24 DIAGNOSIS — M1990 Unspecified osteoarthritis, unspecified site: Secondary | ICD-10-CM

## 2017-04-24 DIAGNOSIS — I10 Essential (primary) hypertension: Secondary | ICD-10-CM

## 2017-04-24 DIAGNOSIS — R0600 Dyspnea, unspecified: Secondary | ICD-10-CM

## 2017-04-24 DIAGNOSIS — M542 Cervicalgia: Secondary | ICD-10-CM

## 2017-04-24 DIAGNOSIS — Z96649 Presence of unspecified artificial hip joint: Secondary | ICD-10-CM

## 2017-04-24 DIAGNOSIS — F17210 Nicotine dependence, cigarettes, uncomplicated: Secondary | ICD-10-CM

## 2017-04-24 DIAGNOSIS — F4321 Adjustment disorder with depressed mood: Secondary | ICD-10-CM

## 2017-04-24 DIAGNOSIS — G894 Chronic pain syndrome: Secondary | ICD-10-CM

## 2017-04-24 MED ORDER — CYCLOBENZAPRINE HCL 10 MG PO TABS: 10.0000 mg | ORAL_TABLET | Freq: Two times a day (BID) | ORAL | 3 refills | Status: DC | PRN

## 2017-04-24 MED ORDER — HYDROCHLOROTHIAZIDE 25 MG PO TABS: 25.0000 mg | ORAL_TABLET | Freq: Every day | ORAL | 2 refills | Status: DC

## 2017-04-24 MED ORDER — SERTRALINE HCL 50 MG PO TABS: 100.0000 mg | ORAL_TABLET | Freq: Every day | ORAL | 11 refills | Status: DC

## 2017-04-24 MED ORDER — GABAPENTIN 300 MG PO CAPS: 300.0000 mg | ORAL_CAPSULE | Freq: Every day | ORAL | 3 refills | Status: DC

## 2017-04-24 MED ORDER — ALBUTEROL SULFATE HFA 108 (90 BASE) MCG/ACT IN AERS: 2.0000 | INHALATION_SPRAY | Freq: Four times a day (QID) | RESPIRATORY_TRACT | 6 refills | Status: DC | PRN

## 2017-04-24 NOTE — Assessment & Plan Note (Signed)
Chronic problem for patient. Patient continues to use albuterol inhaler PRN and states she needs refill on this medication, since she has been using it more frequently in the cold weather. Refills provided.  Plan: -Continue use of albuterol PRN

## 2017-04-24 NOTE — Progress Notes (Signed)
   CC: HTN and depression follow up  HPI:  [name] is a 51 y.o. with PMH of depression, HTN and arthritis who is presenting for evaluation of chronic medical conditions. Since her last visit the patient has been taking all medications as prescribed. She states that she has, however, had significant trouble lately because her mom died about 1 month ago. Since the death of her mother the patient has had difficulty completing activities of daily living, stating that she finds herself wanting to stay in bed all the time. She also endorses intermittent passive SI throughout the month, stating that she wishes sometimes she could just "join her mom". Patient denies self harm behavior and active SI/HI today.   Past Medical History: Past Medical History:  Diagnosis Date  . Arthritis    hip  . Asthma   . Bronchitis    states at least 2-3 months ago  . Carpal tunnel syndrome    bilateral  . Chronic lower back pain   . Depression   . Dyspnea    with exertion  . GERD (gastroesophageal reflux disease)   . Headache    migraines  . Herpes   . Hypertension   . SVD (spontaneous vaginal delivery)    x 2  . Uterine leiomyoma 02/16/2016   Review of Systems:  Patient endorses decreased appetite, increased fatigue Patient denies chest pain, shortness of breath, abdominal pain, diaphoresis, nausea/vomiting, lower extremity swelling, and change in bowel/bladder habits.  Physical Exam:  Vitals:   04/24/17 1346  BP: 128/82  Pulse: 68  Temp: 97.9 F (36.6 C)  TempSrc: Oral  SpO2: 100%  Weight: 144 lb 14.4 oz (65.7 kg)    Physical Exam  Constitutional: She appears well-developed and well-nourished. No distress.  Cardiovascular: Normal rate and regular rhythm. Exam reveals no friction rub.  No murmur heard. Respiratory: Effort normal and breath sounds normal. No respiratory distress. She has no wheezes.  No crackles  GI: Soft. Bowel sounds are normal. She exhibits no distension.  There is no tenderness.  Musculoskeletal: She exhibits no edema (of bilateral lower extremities) or tenderness (of bilateral lower extremities).  Skin: Skin is warm and dry. No rash noted. She is not diaphoretic. No erythema.  Psychiatric:  Patient tearful during exam. Denies active and passive SI/HI today. Mood depressed. Behavior, judgement, and thought content appropriate.    Assessment & Plan:   See Encounters Tab for problem based charting.  Patient seen with Dr. Evette Doffing

## 2017-04-24 NOTE — Progress Notes (Signed)
Office Visit Note   Patient: Jean [name]           Date of Birth: 19-Oct-1965           MRN: [id number] Visit Date: 04/24/2017              Requested by: Thomasene Ripple, MD [address] [place], [place] [address] PCP: Thomasene Ripple, MD   Assessment & Plan: Visit Diagnoses:  1. Cervicalgia     Plan: Given the severity of her stenosis and pain I would like to send her at least to Dr. Ernestina Patches here in the office for a left-sided lower cervical injection.  She will then likely need referral to Dr. Louanne Skye or Dr. Lorin Mercy to potentially consider cervical decompression is warranted.  All questions concerns were answered and addressed.  Follow-Up Instructions: No Follow-up on file.   Orders:  No orders of the defined types were placed in this encounter.  No orders of the defined types were placed in this encounter.     Procedures: No procedures performed   Clinical Data: No additional findings.   Subjective: No chief complaint on file. The patient is [facility] to evaluate cervicalgia and significant left-sided upper extremity radicular pain with numbness and tingling.  She does have an MRI that accompanies her.  She is someone that I performed hip replacement on remotely.  She said her pain is daily and is causing significant weakness in her left arm.  She has pain from the left arm and shoulder going down into her hand but also starting in her neck.  Again she said the MRI was performed and we have this for our review today as well.  HPI  Review of Systems She currently denies any fever, chills, nausea, vomiting.  Objective: Vital Signs: LMP 12/08/2016 (Approximate)   Physical Exam She is alert and oriented x3 and in no acute distress but obvious discomfort.  Ortho Exam When examining her in the room she is holding her left arm above her head for comfort not seen this with cervical spine disease as well and there  is an acute herniation or significant stenosis.  She does have weakness in her triceps and her wrist on the left side as well as weak grip strength and numbness and tingling in her hand.  MRIs reviewed of her cervical spine and it does show Specialty Comments:  No specialty comments available.  Imaging: No results found. Severe stenosis at multiple levels.  Most significant is stenosis that is severe in the left at C5 as well as C7 and C8 due to foraminal narrowing.  PMFS History: Patient Active Problem List   Diagnosis Date Noted  . Cervicalgia 04/24/2017  . CKD (chronic kidney disease) 10/29/2016  . Chest wall pain 10/15/2016  . Menorrhagia 08/27/2016  . Chronic pain syndrome 05/10/2016  . Uterine leiomyoma 02/16/2016  . Liver lesion 12/01/2015  . Situational depression 10/19/2015  . Dyspnea on exertion 08/22/2015  . HTN (hypertension) 07/08/2015  . Routine health maintenance 07/08/2015  . Tobacco abuse 07/08/2015   Past Medical History:  Diagnosis Date  . Arthritis    hip  . Asthma   . Bronchitis    states at least 2-3 months ago  . Carpal tunnel syndrome    bilateral  . Chronic lower back pain   . Depression   . Dyspnea    with exertion  . GERD (gastroesophageal reflux disease)   .  Headache    migraines  . Herpes   . Hypertension   . SVD (spontaneous vaginal delivery)    x 2  . Uterine leiomyoma 02/16/2016    Family History  Problem Relation Age of Onset  . Cancer Mother   . Hypertension Mother   . Diabetes Mother   . Stroke Father     Past Surgical History:  Procedure Laterality Date  . SALPINGECTOMY     lap - ectopic preg  . TOTAL HIP ARTHROPLASTY Left 01/20/2016   Procedure: LEFT TOTAL HIP ARTHROPLASTY ANTERIOR APPROACH;  Surgeon: Mcarthur Rossetti, MD;  Location: [facility];  Service: Orthopedics;  Laterality: Left;  Marland Kitchen VAGINAL HYSTERECTOMY Right 12/11/2016   Procedure: HYSTERECTOMY VAGINAL WITH RIGHT SALPINGECTOMY;  Surgeon: Chancy Milroy, MD;   Location: [facility];  Service: Gynecology;  Laterality: Right;   Social History   Occupational History  . Not on file  Tobacco Use  . Smoking status: Light Tobacco Smoker    Packs/day: 0.25    Years: 11.00    Pack years: 2.75    Types: Cigarettes    Last attempt to quit: 11/28/2016    Years since quitting: 0.4  . Smokeless tobacco: Never Used  Substance and Sexual Activity  . Alcohol use: Yes    Alcohol/week: 1.2 oz    Types: 2 Cans of beer per week  . Drug use: No  . Sexual activity: Yes    Partners: Male    Birth control/protection: None

## 2017-04-24 NOTE — Assessment & Plan Note (Signed)
Patient more depressed recently because of loss of mother about 1 month ago. Patient describes anhedonia, passive/active SI (no SI today), decreased energy, appetite, and weight gain over the past few weeks. Will plan to increase patient's sertraline to 100 mg daily and have her follow up in clinic in 1 month for depression. Discussed referral to [facility] for services with patient, resources given at end of visit.  Plan: -Increase sertraline to 100 mg daily -RTC in 1 month

## 2017-04-24 NOTE — Assessment & Plan Note (Signed)
BP at goal of <140/90 today. Patient able to tolerate HCTZ 25 mg daily. Will plan to continue this medication today. Cr on 03/2017 blood work at baseline.   Plan: -Continue HCTZ 25 mg

## 2017-04-24 NOTE — Assessment & Plan Note (Signed)
Patient continues to feel pain in Hip s/p replacement and L upper arm (tingling with recent imaging showing severe narrowing of C7-C8). Patient currently working with PT and orthopedics, however she has missed multiple appointments per chart review. Patient scheduled to see orthopedic surgeon today. Will follow up accordingly.   Plan: -Continue current pain regimen of ibuprofen, flexeril, and gabapentin. Refills provided.

## 2017-04-24 NOTE — Patient Instructions (Addendum)
Thank you for seeing [place] in the clinic today!  You were evaluated for high blood pressure. Your blood pressure is great today! Please keep taking your HCTZ 25 mg daily.   For your chronic pain, please follow up with your orthopedic doctors and taking medications, including Flexeril and Gabapentin, as previously prescribed.  Please increase the amount of sertraline you take. You will start taking 2 50 mg tablets per day for a total of 100 mg per day. You should start getting more of this medication in the mail to make up for the dose change.    Please return to the clinic in 1 month for follow up of your chronic medical conditions.  If you have any questions or concerns, please call our clinic at [phone] between the hours of 9am-5pm. If you have a problem after these hours, please call [phone] and ask for the internal medicine resident on call. If you feel you are having a medical emergency please call 911.   Thanks, Dr. Larena Glassman Rosenda Geffrard

## 2017-04-25 ENCOUNTER — Other Ambulatory Visit: Payer: Self-pay | Admitting: Internal Medicine

## 2017-04-25 ENCOUNTER — Telehealth: Payer: Self-pay | Admitting: Internal Medicine

## 2017-04-25 NOTE — Telephone Encounter (Signed)
Scripts were resent to [facility]

## 2017-04-25 NOTE — Telephone Encounter (Signed)
Patient said medicine refill went to the wrong pharmacy, and she would like in resent to [facility]

## 2017-04-25 NOTE — Telephone Encounter (Signed)
Pt informed

## 2017-04-26 NOTE — Telephone Encounter (Signed)
Next appt scheduled  05/17/17 in [facility].

## 2017-04-26 NOTE — Progress Notes (Signed)
Internal Medicine Clinic Attending  I saw and evaluated the patient.  I personally confirmed the key portions of the history and exam documented by Dr. Nedrud and I reviewed pertinent patient test results.  The assessment, diagnosis, and plan were formulated together and I agree with the documentation in the resident's note.  

## 2017-05-02 ENCOUNTER — Telehealth (INDEPENDENT_AMBULATORY_CARE_PROVIDER_SITE_OTHER): Payer: Self-pay | Admitting: Radiology

## 2017-05-02 NOTE — Telephone Encounter (Signed)
Patient calling and left voicemail that was transferred to [place]. She is wanting a callback to discuss ESI appointment with Dr. Ernestina Patches. Advised someone from our office had called and left vm on 12/20 to offer her an appointment 05/09/17 but it looks like its already been given away. She did need to be seen ASAP, is there anyway someone can call to discuss this with her. She was referral Dr. [name].

## 2017-05-03 NOTE — Telephone Encounter (Signed)
Called the patient and worked her in for 05/09/17 at 1300 with driver.

## 2017-05-07 DIAGNOSIS — N9489 Other specified conditions associated with female genital organs and menstrual cycle: Secondary | ICD-10-CM

## 2017-05-07 HISTORY — DX: Other specified conditions associated with female genital organs and menstrual cycle: N94.89

## 2017-05-09 ENCOUNTER — Ambulatory Visit: Payer: Self-pay

## 2017-05-09 ENCOUNTER — Ambulatory Visit (INDEPENDENT_AMBULATORY_CARE_PROVIDER_SITE_OTHER): Payer: Self-pay | Admitting: Physical Medicine and Rehabilitation

## 2017-05-09 ENCOUNTER — Encounter (INDEPENDENT_AMBULATORY_CARE_PROVIDER_SITE_OTHER): Payer: Self-pay | Admitting: Physical Medicine and Rehabilitation

## 2017-05-09 ENCOUNTER — Ambulatory Visit (INDEPENDENT_AMBULATORY_CARE_PROVIDER_SITE_OTHER): Payer: Self-pay

## 2017-05-09 VITALS — BP 148/110 | HR 62 | Temp 98.5°F

## 2017-05-09 DIAGNOSIS — M5412 Radiculopathy, cervical region: Secondary | ICD-10-CM

## 2017-05-09 MED ORDER — BETAMETHASONE SOD PHOS & ACET 6 (3-3) MG/ML IJ SUSP
12.0000 mg | Freq: Once | INTRAMUSCULAR | Status: AC
  Administered 2017-05-09: 12 mg

## 2017-05-09 NOTE — Procedures (Signed)
Jean [name] is a 52 year old right-hand-dominant female with severe left arm she also feels like it radiates into the left thigh and leg we try to describe to her that that usually not something that happens in the cervical spine standpoint other than severe spinal cord injuries which the MRI does not show.  It does show foraminal stenosis at at least 2 levels on the left which is fairly severe with facet arthritis.  We spoke at length and we are going to complete a diagnostic and hopefully therapeutic left C7-T1 interlaminar steroid injection.  Consideration would be given to facet joint block for more neck pain.  She may ultimately need to be seen by a spine surgeon for evaluation.  She will follow-up with Dr. [name] as well.  Cervical Epidural Steroid Injection - Interlaminar Approach with Fluoroscopic Guidance  Patient: Jean [name]      Date of Birth: 06-12-1965 MRN: [id number] PCP: Thomasene Ripple, MD      Visit Date: 05/09/2017   Universal Protocol:    Date/Time: 05/13/1910:13 PM  Consent Given By: the patient  Position: PRONE  Additional Comments: Vital signs were monitored before and after the procedure. Patient was prepped and draped in the usual sterile fashion. The correct patient, procedure, and site was verified.   Injection Procedure Details:  Procedure Site One Meds Administered:  Meds ordered this encounter  Medications  . betamethasone acetate-betamethasone sodium phosphate (CELESTONE) injection 12 mg     Laterality: Left  Location/Site:  C7-T1  Needle size: 20 G  Needle type: Touhy  Needle Placement: Paramedian epidural space  Findings:  -Comments: Excellent flow of contrast into the epidural space.  Procedure Details: Using a paramedian approach from the side mentioned above, the region overlying the inferior lamina was localized under fluoroscopic visualization and the soft tissues overlying this structure were infiltrated with 4 ml. of 1%  Lidocaine without Epinephrine. A # 20 gauge, Tuohy needle was inserted into the epidural space using a paramedian approach.  The epidural space was localized using loss of resistance along with lateral and contralateral oblique bi-planar fluoroscopic views.  After negative aspirate for air, blood, and CSF, a 2 ml. volume of Isovue-250 was injected into the epidural space and the flow of contrast was observed. Radiographs were obtained for documentation purposes.   The injectate was administered into the level noted above.  Additional Comments:  The patient tolerated the procedure well Dressing: Band-Aid    Post-procedure details: Patient was observed during the procedure. Post-procedure instructions were reviewed.  Patient left the clinic in stable condition.

## 2017-05-09 NOTE — Progress Notes (Deleted)
Pt states severe sharp pain in left arm that radiates down to left leg. Pt states pain in right hand. Pain started in November and has gotten worse over time. Pt states numbness and tingling in left arm, left thigh, and right hand. Pt states pain gets worse at night in the bed, and gets better if holding left arm in the air. +Driver, -BTs, -Dye Allergies.

## 2017-05-09 NOTE — Patient Instructions (Signed)

## 2017-05-13 ENCOUNTER — Telehealth (INDEPENDENT_AMBULATORY_CARE_PROVIDER_SITE_OTHER): Payer: Self-pay | Admitting: Physical Medicine and Rehabilitation

## 2017-05-13 ENCOUNTER — Other Ambulatory Visit: Payer: Self-pay

## 2017-05-13 NOTE — Telephone Encounter (Signed)
Pt states she is still waiting for meds from [facility]. Please call pt back.

## 2017-05-13 NOTE — Telephone Encounter (Signed)
Talked to pt - aware meds have been refilled ; informed to call [facility]. Stated she will; telephone # given to pt.

## 2017-05-13 NOTE — Telephone Encounter (Signed)
She is till early in the injection time frame. It has not had time to say it hasn't worked yet. IF not taking gabapentin could start that

## 2017-05-15 NOTE — Telephone Encounter (Signed)
Patient has not responded to my message. Will wait for her to call back.

## 2017-05-17 ENCOUNTER — Ambulatory Visit (INDEPENDENT_AMBULATORY_CARE_PROVIDER_SITE_OTHER): Payer: No Typology Code available for payment source | Admitting: Internal Medicine

## 2017-05-17 ENCOUNTER — Other Ambulatory Visit: Payer: Self-pay

## 2017-05-17 ENCOUNTER — Ambulatory Visit: Payer: Self-pay

## 2017-05-17 ENCOUNTER — Encounter: Payer: Self-pay | Admitting: Internal Medicine

## 2017-05-17 VITALS — BP 134/80 | HR 73 | Temp 97.8°F | Ht 63.0 in | Wt 145.4 lb

## 2017-05-17 DIAGNOSIS — M542 Cervicalgia: Secondary | ICD-10-CM

## 2017-05-17 DIAGNOSIS — M4683 Other specified inflammatory spondylopathies, cervicothoracic region: Secondary | ICD-10-CM

## 2017-05-17 DIAGNOSIS — F339 Major depressive disorder, recurrent, unspecified: Secondary | ICD-10-CM

## 2017-05-17 DIAGNOSIS — Z720 Tobacco use: Secondary | ICD-10-CM

## 2017-05-17 DIAGNOSIS — Z79899 Other long term (current) drug therapy: Secondary | ICD-10-CM

## 2017-05-17 DIAGNOSIS — Z7289 Other problems related to lifestyle: Secondary | ICD-10-CM

## 2017-05-17 DIAGNOSIS — G8929 Other chronic pain: Secondary | ICD-10-CM

## 2017-05-17 DIAGNOSIS — F320 Major depressive disorder, single episode, mild: Secondary | ICD-10-CM

## 2017-05-17 DIAGNOSIS — Z0000 Encounter for general adult medical examination without abnormal findings: Secondary | ICD-10-CM

## 2017-05-17 DIAGNOSIS — F1490 Cocaine use, unspecified, uncomplicated: Secondary | ICD-10-CM

## 2017-05-17 DIAGNOSIS — F1090 Alcohol use, unspecified, uncomplicated: Secondary | ICD-10-CM

## 2017-05-17 DIAGNOSIS — Z23 Encounter for immunization: Secondary | ICD-10-CM

## 2017-05-17 NOTE — Assessment & Plan Note (Signed)
Patient being seen by Dr. Ernestina Patches and Dr. [name] at [facility] orthopedics; she received a corticosteroid injection on 1/03 and has had relief of her LUE pain though she still has numbness and tingling. She denies weakness. MRI from 04/20/17 shows severe cervical facet arthritis w/ reactive marrow edema on R C5-T1 and L C7-T1 facets.  Exam today revealed full ROM in LUE with strength and sensation intact.  Plan: --advised to f/u with ortho as she may require decompressive surgery

## 2017-05-17 NOTE — Assessment & Plan Note (Signed)
Flu vaccine provided today. Screening for HCV and HIV obtained today.  Mammogram has previously been ordered; patient given info on making appointment.  Will plan on pursuing colon cancer screening at future appt as well as Tdap vaccination.

## 2017-05-17 NOTE — Assessment & Plan Note (Signed)
Symptoms improved.  Plan: --continue zoloft 100mg  daily

## 2017-05-17 NOTE — Patient Instructions (Addendum)
Continue taking your medicines as they are now.   I'm glad your arm is feeling better! Call back Dr. Ernestina Patches to discuss your symptoms of tingling.  Please make an appointment with me in about a month for a regular follow up.  Call the [facility] hospital to set up your appointment for a mammogram. Their phone number is [phone].

## 2017-05-17 NOTE — Progress Notes (Signed)
   CC: depression  HPI:  [name] is a 52 y.o. with a PMH of MDD, chronic pain, tobacco use and cocaine use presenting to clinic for f/u on her depression.  At last visit, it was planned for zoloft to be increased to 100mg  daily, however patient states that she was out of her 50mg  pills and her new refill of the sertraline had just been delivered 2 days ago. She states that she has started the 100mg  sertraline daily upon receiving the medication a couple of days ago. She states that overall she feels like her depression symptoms have improved since last visit. PHQ9 last month was 9 and today was 4.   Please see problem based Assessment and Plan for status of patients chronic conditions.  Past Medical History:  Diagnosis Date  . Arthritis    hip  . Asthma   . Bronchitis    states at least 2-3 months ago  . Carpal tunnel syndrome    bilateral  . Chronic lower back pain   . Depression   . Dyspnea    with exertion  . GERD (gastroesophageal reflux disease)   . Headache    migraines  . Herpes   . Hypertension   . SVD (spontaneous vaginal delivery)    x 2  . Uterine leiomyoma 02/16/2016    Review of Systems:   ROS Per HPI  Physical Exam:  Vitals:   05/17/17 1022  BP: 134/80  Pulse: 73  Temp: 97.8 F (36.6 C)  TempSrc: Oral  SpO2: 100%  Weight: 145 lb 6.4 oz (66 kg)  Height: 5\' 3"  (1.6 m)   GENERAL- alert, co-operative, appears as stated age, not in any distress. HEENT- oral mucosa appears moist CARDIAC- RRR, no murmurs, rubs or gallops. RESP- Moving equal volumes of air, and clear to auscultation bilaterally, no wheezes or crackles. ABDOMEN- Soft, nontender, bowel sounds present. NEURO- No obvious Cr N abnormality. EXTREMITIES- pulse 2+, full ROM in LUE with strength and sensation intact. SKIN- Warm, dry, no rash or lesion. PSYCH- Normal mood and affect, appropriate thought content and speech.  Assessment & Plan:   See Encounters Tab for problem based  charting.   Patient discussed with Dr. Nilsa Nutting, MD Internal Medicine PGY2

## 2017-05-18 LAB — HEPATITIS C ANTIBODY: Hep C Virus Ab: 0.1 s/co ratio (ref 0.0–0.9)

## 2017-05-18 LAB — HIV ANTIBODY (ROUTINE TESTING W REFLEX): HIV Screen 4th Generation wRfx: NONREACTIVE

## 2017-05-20 NOTE — Progress Notes (Signed)
Internal Medicine Clinic Attending  Case discussed with Dr. Svalina  at the time of the visit.  We reviewed the resident's history and exam and pertinent patient test results.  I agree with the assessment, diagnosis, and plan of care documented in the resident's note.  

## 2017-05-28 ENCOUNTER — Telehealth (INDEPENDENT_AMBULATORY_CARE_PROVIDER_SITE_OTHER): Payer: Self-pay | Admitting: Physical Medicine and Rehabilitation

## 2017-05-28 NOTE — Telephone Encounter (Signed)
See below. Please advise. Not sure which Dr. [name] would recommend?

## 2017-05-28 NOTE — Telephone Encounter (Signed)
She needs to see a spine/neck specialist for a surgical evaluation - [facility], or neurosurgery.

## 2017-05-28 NOTE — Telephone Encounter (Signed)
See below

## 2017-05-28 NOTE — Telephone Encounter (Signed)
Can you make her an appt with Lorin Mercy, just his next available appt is fine.

## 2017-05-28 NOTE — Telephone Encounter (Signed)
Per Dr. [name] note will need to See Nitka/Yates

## 2017-05-29 ENCOUNTER — Ambulatory Visit (INDEPENDENT_AMBULATORY_CARE_PROVIDER_SITE_OTHER): Payer: Self-pay | Admitting: Orthopaedic Surgery

## 2017-06-03 ENCOUNTER — Encounter (INDEPENDENT_AMBULATORY_CARE_PROVIDER_SITE_OTHER): Payer: Self-pay | Admitting: Orthopaedic Surgery

## 2017-06-03 ENCOUNTER — Ambulatory Visit (INDEPENDENT_AMBULATORY_CARE_PROVIDER_SITE_OTHER): Payer: Self-pay | Admitting: Orthopaedic Surgery

## 2017-06-03 VITALS — BP 134/91

## 2017-06-03 DIAGNOSIS — M542 Cervicalgia: Secondary | ICD-10-CM

## 2017-06-03 NOTE — Progress Notes (Signed)
The patient is following up after having a left-sided lower cervical injection by Dr. Ernestina Patches.  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  She said the injection did help decrease her pain significantly but she is still getting numbness and tingling both hands.  Her blood pressure is also been running high and she is headaches.  I encouraged her to get in with her primary care physician this week.  In the office today her blood pressure was 134/91.  On exam she is able to rotate her neck and in her neck better and she is got good strength in her bilateral upper extremities.  She has subjective numbness in the hands.  At this point we may end up needing to refer her to a neck surgeon for further relation treatment.  However we want to get this injection little more time to take effect and it is essential that she get her blood pressure under control.  I will see her back in a month see how she is doing overall.

## 2017-06-11 NOTE — Progress Notes (Deleted)
   CC: ***  HPI:  [name] is a 52 y.o. with a PMH of ***  Please see problem based Assessment and Plan for status of patients chronic conditions.  Past Medical History:  Diagnosis Date  . Arthritis    hip  . Asthma   . Carpal tunnel syndrome    bilateral  . Chronic lower back pain   . Depression   . Dyspnea    with exertion  . GERD (gastroesophageal reflux disease)   . Headache    migraines  . Herpes   . Hypertension   . SVD (spontaneous vaginal delivery)    x 2  . Uterine leiomyoma 02/16/2016    Review of Systems:   ROS  Physical Exam:  There were no vitals filed for this visit. GENERAL- alert, co-operative, appears as stated age, not in any distress. HEENT- Atraumatic, normocephalic, PERRL, EOMI, oral mucosa appears moist CARDIAC- RRR, no murmurs, rubs or gallops. RESP- Moving equal volumes of air, and clear to auscultation bilaterally, no wheezes or crackles. ABDOMEN- Soft, nontender, bowel sounds present. NEURO- No obvious Cr N abnormality. EXTREMITIES- pulse 2+, symmetric, no pedal edema. SKIN- Warm, dry, No rash or lesion. PSYCH- Normal mood and affect, appropriate thought content and speech.  Assessment & Plan:   See Encounters Tab for problem based charting.   Patient {GC/GE:[id number]::"discussed with","seen with"} Dr. {NAMES:[id number]::"Butcher","Granfortuna","E. Hoffman","Klima","Mullen","Narendra","Vincent"}   Jean Grieve, MD Internal Medicine PGY2

## 2017-06-12 ENCOUNTER — Encounter: Payer: Self-pay | Admitting: Internal Medicine

## 2017-06-13 ENCOUNTER — Ambulatory Visit: Payer: Self-pay

## 2017-06-14 ENCOUNTER — Encounter: Payer: Self-pay | Admitting: Internal Medicine

## 2017-06-14 ENCOUNTER — Ambulatory Visit (INDEPENDENT_AMBULATORY_CARE_PROVIDER_SITE_OTHER): Payer: Self-pay | Admitting: Internal Medicine

## 2017-06-14 VITALS — BP 142/79 | HR 68 | Temp 98.0°F | Wt 144.8 lb

## 2017-06-14 DIAGNOSIS — Z79899 Other long term (current) drug therapy: Secondary | ICD-10-CM

## 2017-06-14 DIAGNOSIS — I10 Essential (primary) hypertension: Secondary | ICD-10-CM

## 2017-06-14 DIAGNOSIS — Z720 Tobacco use: Secondary | ICD-10-CM

## 2017-06-14 DIAGNOSIS — R531 Weakness: Secondary | ICD-10-CM

## 2017-06-14 MED ORDER — AMLODIPINE BESYLATE 5 MG PO TABS: 5.0000 mg | ORAL_TABLET | Freq: Every day | ORAL | 1 refills | Status: DC

## 2017-06-14 NOTE — Patient Instructions (Signed)
Jean [name],   Please start taking amlodipine 5 mg every day.   Please come back in 4 weeks to recheck your blood pressure.   Call us if you have any questions.

## 2017-06-14 NOTE — Progress Notes (Signed)
   CC: HTN follow up   HPI:  [name] is a 52 y.o. female with PMH listed below who presents to clinic for hypertension follow-up.  Please see problem based assessment and plan for further details.   Past Medical History:  Diagnosis Date  . Arthritis    hip  . Asthma   . Carpal tunnel syndrome    bilateral  . Chronic lower back pain   . Depression   . Dyspnea    with exertion  . GERD (gastroesophageal reflux disease)   . Headache    migraines  . Herpes   . Hypertension   . SVD (spontaneous vaginal delivery)    x 2  . Uterine leiomyoma 02/16/2016   Review of Systems:   Review of Systems  Constitutional: Negative for chills, fever, malaise/fatigue and weight loss.  Respiratory: Negative for cough and shortness of breath.   Cardiovascular: Negative for chest pain, palpitations and leg swelling.  Neurological: Positive for weakness. Negative for dizziness and headaches.    Physical Exam:  Vitals:   06/14/17 1031  BP: (!) 142/79  Pulse: 68  Temp: 98 F (36.7 C)  SpO2: 99%  Weight: 144 lb 12.8 oz (65.7 kg)   General: very pleasant female, well-nourished, well-developed, in no acute distress  Cardiac: regular rate and rhythm, nl S1/S2, no murmurs, rubs or gallops  Pulm: CTAB, no wheezes or crackles, no increased work of breathing   Ext: warm and well perfused, no peripheral edema   Assessment & Plan:   See Encounters Tab for problem based charting.  Patient discussed with Dr. Angelia Mould

## 2017-06-14 NOTE — Assessment & Plan Note (Signed)
Patient presents for hypertension follow-up.  Takes HCTZ 25 daily and compliant.  Recently evaluated by neurosurgery for neck surgery and found to have sBP 170s.  Denies headache, changes in vision, chest pain, shortness of breath, or lower extremity swelling.  Today BP is 142/79.  - Start amlodipine 5 mg QD  - Follow up in 4 weeks

## 2017-06-23 NOTE — Progress Notes (Signed)
Internal Medicine Clinic Attending  Case discussed with Dr. Santos-Sanchez at the time of the visit.  We reviewed the resident's history and exam and pertinent patient test results.  I agree with the assessment, diagnosis, and plan of care documented in the resident's note.    

## 2017-07-01 ENCOUNTER — Ambulatory Visit (INDEPENDENT_AMBULATORY_CARE_PROVIDER_SITE_OTHER): Payer: Self-pay | Admitting: Orthopaedic Surgery

## 2017-07-01 ENCOUNTER — Encounter (INDEPENDENT_AMBULATORY_CARE_PROVIDER_SITE_OTHER): Payer: Self-pay | Admitting: Orthopaedic Surgery

## 2017-07-01 DIAGNOSIS — M4802 Spinal stenosis, cervical region: Secondary | ICD-10-CM

## 2017-07-01 NOTE — Progress Notes (Signed)
The patient is following up after having an intervention by Dr. Ernestina Patches in the cervical spine.  This is on January 3.  She says her neck pain is almost gone completely and she just has numbness and tingling down her hands with no radiating symptoms up her arms.  She is very satisfied from that standpoint.  When she was being seen in [place] she says she was told she had carpal tunnel syndrome and needed surgery.  We have no records of that that I can see in terms of old studies.  Certainly some of her radicular components may be related to her severe cervical stenosis.  On exam she does have subjective Phalen's and Tinel's consider positive with numbness globally though in both hands.  She has excellent strength in her bilateral upper extremities and slightly weak grip strength and pinch strength bilaterally.  At this point I would like to obtain repeat bilateral upper extremity nerve conduction studies to assess this before determine whether or not we need to send her for a surgical evaluation from the cervical spine standpoint.

## 2017-07-02 ENCOUNTER — Other Ambulatory Visit (INDEPENDENT_AMBULATORY_CARE_PROVIDER_SITE_OTHER): Payer: Self-pay

## 2017-07-02 DIAGNOSIS — M542 Cervicalgia: Secondary | ICD-10-CM

## 2017-07-10 ENCOUNTER — Encounter: Payer: Self-pay | Admitting: Internal Medicine

## 2017-07-10 DIAGNOSIS — N9489 Other specified conditions associated with female genital organs and menstrual cycle: Secondary | ICD-10-CM | POA: Insufficient documentation

## 2017-07-11 ENCOUNTER — Encounter: Payer: Self-pay | Admitting: *Deleted

## 2017-07-11 ENCOUNTER — Ambulatory Visit: Payer: Self-pay

## 2017-07-16 ENCOUNTER — Telehealth (INDEPENDENT_AMBULATORY_CARE_PROVIDER_SITE_OTHER): Payer: Self-pay | Admitting: Orthopaedic Surgery

## 2017-07-16 NOTE — Telephone Encounter (Signed)
I think this is probably for you guys

## 2017-07-16 NOTE — Telephone Encounter (Addendum)
Pt requesting injection in neck pt called left Vm

## 2017-07-18 NOTE — Telephone Encounter (Signed)
Please advise. Patient was referred to [place] for a nerve study. Injection helped her neck pain, but not the numbness/ tingling. We closed her referral for nerve study because she did not return repeated calls. Dr. [name] wanted the nerve study before referring to neurosurgery.

## 2017-07-18 NOTE — Telephone Encounter (Signed)
Can repeat injection x1 and see how long it lasts, last injection was in Jan.  She does need to schedule nerve study as well when she talks with you

## 2017-07-19 NOTE — Telephone Encounter (Signed)
Scheduled for [facility] 4/8 and [facility] 4/18.

## 2017-07-29 ENCOUNTER — Ambulatory Visit (INDEPENDENT_AMBULATORY_CARE_PROVIDER_SITE_OTHER): Payer: Self-pay | Admitting: Orthopaedic Surgery

## 2017-08-07 ENCOUNTER — Ambulatory Visit (INDEPENDENT_AMBULATORY_CARE_PROVIDER_SITE_OTHER): Payer: Self-pay | Admitting: Orthopaedic Surgery

## 2017-08-07 ENCOUNTER — Ambulatory Visit (INDEPENDENT_AMBULATORY_CARE_PROVIDER_SITE_OTHER): Payer: Self-pay

## 2017-08-07 ENCOUNTER — Encounter (INDEPENDENT_AMBULATORY_CARE_PROVIDER_SITE_OTHER): Payer: Self-pay | Admitting: Orthopaedic Surgery

## 2017-08-07 DIAGNOSIS — M25551 Pain in right hip: Secondary | ICD-10-CM

## 2017-08-07 MED ORDER — LIDOCAINE HCL 1 % IJ SOLN
0.5000 mL | INTRAMUSCULAR | Status: AC | PRN
  Administered 2017-08-07: .5 mL

## 2017-08-07 MED ORDER — METHYLPREDNISOLONE ACETATE 40 MG/ML IJ SUSP
40.0000 mg | INTRAMUSCULAR | Status: AC | PRN
  Administered 2017-08-07: 40 mg via INTRA_ARTICULAR

## 2017-08-07 NOTE — Progress Notes (Signed)
   Procedure Note  Patient: Jean [name]             Date of Birth: 30-May-1965           MRN: [id number]             Visit Date: 08/07/2017  HPI: Jean [name] is well-known to our department service comes in today due to bilateral hip pain.  She had a previous left total hip arthroplasty.  She is having no groin pain either hip.  Denies any radicular symptoms down either leg.  She having pain lateral aspect of both hips right greater than left especially when lying on her hips.  She states she tosses and turns all night.  She had no injury to either hip.  Review of systems :: Positive for shortness of breath which is chronic otherwise review of systems negative.Marland Kitchen  Physical exam: General well-developed well-nourished female in no acute distress.  Ambulates with an antalgic gait with the use of a cane.   Bilateral hip she has excellent range of motion of both hips without pain.  Tenderness over the trochanteric region of both hips.  Vitals: Blood pressure 147/98 pulse 74  Radiographs AP pelvis and lateral view of the right hip: No acute fractures.  Hips well located.  Hip joint is well-maintained.  Left total hip arthroplasty with well-seated components no signs of any hardware failure or abnormalities. Procedures: Visit Diagnoses: Pain in right hip - Plan: XR HIP UNILAT W OR W/O PELVIS 1V RIGHT  Large Joint Inj: bilateral greater trochanter on 08/07/2017 11:14 AM Indications: pain Details: 22 G 1.5 in needle, lateral approach  Arthrogram: No  Medications (Right): 0.5 mL lidocaine 1 %; 40 mg methylPREDNISolone acetate 40 MG/ML Medications (Left): 0.5 mL lidocaine 1 %; 40 mg methylPREDNISolone acetate 40 MG/ML Outcome: tolerated well, no immediate complications Procedure, treatment alternatives, risks and benefits explained, specific risks discussed. Consent was given by the patient. Immediately prior to procedure a time out was called to verify the correct patient, procedure, equipment,  support staff and site/side marked as required. Patient was prepped and draped in the usual sterile fashion.     Plan: She shown IT band stretching exercises that I had her demonstrate these today.  She will perform these on her own at home.  She will follow-up with [place] after her nerve conduction studies to go over the results and discuss further treatment.  We can reassess her hip pain at that time.

## 2017-08-12 ENCOUNTER — Encounter (INDEPENDENT_AMBULATORY_CARE_PROVIDER_SITE_OTHER): Payer: Self-pay | Admitting: Physical Medicine and Rehabilitation

## 2017-08-12 ENCOUNTER — Telehealth (INDEPENDENT_AMBULATORY_CARE_PROVIDER_SITE_OTHER): Payer: Self-pay | Admitting: *Deleted

## 2017-08-12 ENCOUNTER — Ambulatory Visit (INDEPENDENT_AMBULATORY_CARE_PROVIDER_SITE_OTHER): Payer: Self-pay | Admitting: Physical Medicine and Rehabilitation

## 2017-08-12 ENCOUNTER — Ambulatory Visit (INDEPENDENT_AMBULATORY_CARE_PROVIDER_SITE_OTHER): Payer: Self-pay

## 2017-08-12 VITALS — BP 127/82 | HR 66 | Temp 98.2°F

## 2017-08-12 DIAGNOSIS — M5412 Radiculopathy, cervical region: Secondary | ICD-10-CM

## 2017-08-12 MED ORDER — METHYLPREDNISOLONE ACETATE 80 MG/ML IJ SUSP
80.0000 mg | Freq: Once | INTRAMUSCULAR | Status: AC
  Administered 2017-08-12: 80 mg

## 2017-08-12 NOTE — Progress Notes (Signed)
 .  Numeric Pain Rating Scale and Functional Assessment Average Pain 8   In the last MONTH (on 0-10 scale) has pain interfered with the following?  1. General activity like being  able to carry out your everyday physical activities such as walking, climbing stairs, carrying groceries, or moving a chair?  Rating(7)   +Driver, -BT, -Dye Allergies.  

## 2017-08-12 NOTE — Patient Instructions (Signed)

## 2017-08-13 ENCOUNTER — Ambulatory Visit: Payer: Self-pay

## 2017-08-16 NOTE — Progress Notes (Signed)
Jean [name] - 52 y.o. female MRN [id number]  Date of birth: 27-Jul-1965  Office Visit Note: Visit Date: 08/12/2017 PCP: Alphonzo Grieve, MD Referred by: Alphonzo Grieve, MD  Subjective: Chief Complaint  Patient presents with  . Neck - Pain  . Right Shoulder - Pain  . Left Shoulder - Pain   HPI: Jean [name] is a 52 year old right-hand-dominant female who is followed by Dr. [name].  We have completed one cervical epidural injection which gave her great relief of her neck and shoulder pain but not of the numbness and tingling and paresthesia in the hands.  I believe electrodiagnostic study is on order for looking at 52 carpal tunnel complaints.  Since that time we have seen her pain has returned in the neck and shoulders.  MRI shows multilevel facet arthropathy and foraminal stenosis.  We are going to repeat the epidural injections as it seemed to help.  It would probably wise for her to see a spine surgeon for evaluation.  She does not have much in the way of central stenosis.  Could potentially look at diagnostic medial branch blocks and ablation.  We will complete the injection today from an intralaminar epidural approach.   ROS Otherwise per HPI.  Assessment & Plan: Visit Diagnoses:  1. Cervical radiculopathy     Plan: No additional findings.   Meds & Orders:  Meds ordered this encounter  Medications  . methylPREDNISolone acetate (DEPO-MEDROL) injection 80 mg    Orders Placed This Encounter  Procedures  . XR C-ARM NO REPORT  . Epidural Steroid injection    Follow-up: Return if symptoms worsen or fail to improve.   Procedures: No procedures performed  Cervical Epidural Steroid Injection - Interlaminar Approach with Fluoroscopic Guidance  Patient: Jean [name]      Date of Birth: Nov 18, 1965 MRN: [id number] PCP: Alphonzo Grieve, MD      Visit Date: 08/12/2017   Universal Protocol:    Date/Time: 04/12/195:25 AM  Consent Given By: the patient  Position:  PRONE  Additional Comments: Vital signs were monitored before and after the procedure. Patient was prepped and draped in the usual sterile fashion. The correct patient, procedure, and site was verified.   Injection Procedure Details:  Procedure Site One Meds Administered:  Meds ordered this encounter  Medications  . methylPREDNISolone acetate (DEPO-MEDROL) injection 80 mg     Laterality: Right  Location/Site: C7-T1  Needle size: 20 G  Needle type: Touhy  Needle Placement: Paramedian epidural space  Findings:  -Comments: Excellent flow of contrast into the epidural space.  Procedure Details: Using a paramedian approach from the side mentioned above, the region overlying the inferior lamina was localized under fluoroscopic visualization and the soft tissues overlying this structure were infiltrated with 4 ml. of 1% Lidocaine without Epinephrine. A # 20 gauge, Tuohy needle was inserted into the epidural space using a paramedian approach.  The epidural space was localized using loss of resistance along with lateral and contralateral oblique bi-planar fluoroscopic views.  After negative aspirate for air, blood, and CSF, a 2 ml. volume of Isovue-250 was injected into the epidural space and the flow of contrast was observed. Radiographs were obtained for documentation purposes.   The injectate was administered into the level noted above.  Additional Comments:  The patient tolerated the procedure well Dressing: Band-Aid    Post-procedure details: Patient was observed during the procedure. Post-procedure instructions were reviewed.  Patient left the clinic in stable condition.   Clinical History: MRI CERVICAL SPINE  WITHOUT CONTRAST  TECHNIQUE: Multiplanar, multisequence MR imaging of the cervical spine was performed. No intravenous contrast was administered.  COMPARISON:  Prior radiograph from 07/18/2016.  FINDINGS: Alignment: Trace anterolisthesis of C5 on C6 and  C7 on T1. Mild straightening of the normal cervical lordosis.  Vertebrae: Vertebral body heights maintained without evidence for acute or chronic fracture. Bone marrow signal intensity within normal limits. No discrete or worrisome osseous lesions. Reactive marrow edema about the right C5-6, C6-7, and C7-T1 facets (series 4, image 1) as well as the left C7-T1 facet (series 4, image 11) due to facet arthritis.  Cord: Signal intensity within the cervical spinal cord is normal.  Posterior Fossa, vertebral arteries, paraspinal tissues: Visualized brain and posterior fossa within normal limits. Craniocervical junction normal. Paraspinous and prevertebral soft tissues normal. Normal flow voids present within the vertebral arteries bilaterally.  Disc levels:  C2-C3: Unremarkable.  C3-C4: Diffuse disc bulge with mild bilateral uncovertebral hypertrophy. No significant spinal stenosis. Mild to moderate bilateral C4 foraminal narrowing.  C4-C5: Mild diffuse disc bulge. Left greater than right uncovertebral spurring. Moderate left greater than right facet arthrosis. No significant canal narrowing. Severe left with mild-to-moderate right C5 foraminal stenosis.  C5-C6: Diffuse disc bulge with mild intervertebral disc space narrowing. Bilateral uncovertebral hypertrophy. Left greater than right facet arthrosis. No significant spinal stenosis. Mild to moderate left with mild right C6 foraminal narrowing.  C6-C7: Mild disc bulge with bilateral uncovertebral hypertrophy. Moderate left greater than right facet arthrosis. Ligament flavum thickening. No significant canal stenosis. Severe left with mild to moderate right C7 foraminal stenosis.  C7-T1: Trace anterolisthesis. Minimal disc bulge. Advanced bilateral facet arthrosis with ligamentum flavum thickening, left worse than right. No significant spinal stenosis. Moderate to severe left with moderate right C8 foraminal  stenosis.  Visualized upper thoracic spine within normal limits.  IMPRESSION: 1. 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 2. Multifactorial degenerative changes with resultant multilevel foraminal narrowing as above. Given patient's left-sided symptoms, notable findings include severe left C5 foraminal stenosis with severe left C7 and C8 foraminal narrowing. Additional more mild foraminal encroachment as above. 3. Mild noncompressive disc bulging at C3-4 through C7-T1 without significant spinal stenosis.   Electronically Signed   By: Jeannine Boga M.D.   On: 04/20/2017 22:06   She reports that she has been smoking cigarettes.  She has a 2.75 pack-year smoking history. She has never used smokeless tobacco. No results for input(s): HGBA1C, LABURIC in the last 8760 hours.  Objective:  VS:  HT:    WT:   BMI:     BP:127/82  HR:66bpm  TEMP:98.2 F (36.8 C)(Oral)  RESP:97 % Physical Exam  Musculoskeletal:  She has good strength in the upper extremities bilaterally with a negative Hoffman's test bilaterally.    Ortho Exam Imaging: No results found.  Past Medical/Family/Surgical/Social History: Medications & Allergies reviewed per EMR, new medications updated. Patient Active Problem List   Diagnosis Date Noted  . Adnexal mass 07/10/2017  . Spinal stenosis of cervical region 07/01/2017  . Cervicalgia 04/24/2017  . CKD (chronic kidney disease) 10/29/2016  . Chest wall pain 10/15/2016  . Chronic pain syndrome 05/10/2016  . Liver lesion 12/01/2015  . MDD (major depressive disorder) 10/19/2015  . Dyspnea on exertion 08/22/2015  . HTN (hypertension) 07/08/2015  . Routine health maintenance 07/08/2015  . Tobacco abuse 07/08/2015   Past Medical History:  Diagnosis Date  . Arthritis  hip  . Asthma   . Carpal tunnel syndrome    bilateral  . Chronic lower back  pain   . Depression   . Dyspnea    with exertion  . GERD (gastroesophageal reflux disease)   . Headache    migraines  . Herpes   . Hypertension   . SVD (spontaneous vaginal delivery)    x 2  . Uterine leiomyoma 02/16/2016   Family History  Problem Relation Age of Onset  . Cancer Mother   . Hypertension Mother   . Diabetes Mother   . Stroke Father    Past Surgical History:  Procedure Laterality Date  . SALPINGECTOMY     lap - ectopic preg  . TOTAL HIP ARTHROPLASTY Left 01/20/2016   Procedure: LEFT TOTAL HIP ARTHROPLASTY ANTERIOR APPROACH;  Surgeon: Mcarthur Rossetti, MD;  Location: [facility];  Service: Orthopedics;  Laterality: Left;  Marland Kitchen VAGINAL HYSTERECTOMY Right 12/11/2016   Procedure: HYSTERECTOMY VAGINAL WITH RIGHT SALPINGECTOMY;  Surgeon: Chancy Milroy, MD;  Location: [place] [facility];  Service: Gynecology;  Laterality: Right;   Social History   Occupational History  . Not on file  Tobacco Use  . Smoking status: Light Tobacco Smoker    Packs/day: 0.25    Years: 11.00    Pack years: 2.75    Types: Cigarettes    Last attempt to quit: 11/28/2016    Years since quitting: 0.7  . Smokeless tobacco: Never Used  Substance and Sexual Activity  . Alcohol use: Yes    Alcohol/week: 1.2 oz    Types: 2 Cans of beer per week  . Drug use: No    Types: "Crack" cocaine, Cocaine  . Sexual activity: Yes    Partners: Male    Birth control/protection: None

## 2017-08-16 NOTE — Procedures (Signed)
Cervical Epidural Steroid Injection - Interlaminar Approach with Fluoroscopic Guidance  Patient: Jean [name]      Date of Birth: [birth year] MRN: [id number] PCP: Alphonzo Grieve, MD      Visit Date: 08/12/2017   Universal Protocol:    Date/Time: 04/12/195:25 AM  Consent Given By: the patient  Position: PRONE  Additional Comments: Vital signs were monitored before and after the procedure. Patient was prepped and draped in the usual sterile fashion. The correct patient, procedure, and site was verified.   Injection Procedure Details:  Procedure Site One Meds Administered:  Meds ordered this encounter  Medications  . methylPREDNISolone acetate (DEPO-MEDROL) injection 80 mg     Laterality: Right  Location/Site: C7-T1  Needle size: 20 G  Needle type: Touhy  Needle Placement: Paramedian epidural space  Findings:  -Comments: Excellent flow of contrast into the epidural space.  Procedure Details: Using a paramedian approach from the side mentioned above, the region overlying the inferior lamina was localized under fluoroscopic visualization and the soft tissues overlying this structure were infiltrated with 4 ml. of 1% Lidocaine without Epinephrine. A # 20 gauge, Tuohy needle was inserted into the epidural space using a paramedian approach.  The epidural space was localized using loss of resistance along with lateral and contralateral oblique bi-planar fluoroscopic views.  After negative aspirate for air, blood, and CSF, a 2 ml. volume of Isovue-250 was injected into the epidural space and the flow of contrast was observed. Radiographs were obtained for documentation purposes.   The injectate was administered into the level noted above.  Additional Comments:  The patient tolerated the procedure well Dressing: Band-Aid    Post-procedure details: Patient was observed during the procedure. Post-procedure instructions were reviewed.  Patient left the clinic in stable  condition.

## 2017-08-19 ENCOUNTER — Other Ambulatory Visit: Payer: Self-pay | Admitting: Internal Medicine

## 2017-08-19 DIAGNOSIS — R0609 Other forms of dyspnea: Principal | ICD-10-CM

## 2017-08-19 MED ORDER — ALBUTEROL SULFATE HFA 108 (90 BASE) MCG/ACT IN AERS: 2.0000 | INHALATION_SPRAY | Freq: Four times a day (QID) | RESPIRATORY_TRACT | 0 refills | Status: DC | PRN

## 2017-08-19 NOTE — Telephone Encounter (Signed)
Patient is requesting a refill on all her medicine.   Patient really needs inhalers she would like that to [facility] on [address]

## 2017-08-19 NOTE — Telephone Encounter (Signed)
Called pt - stated she ran out of her inhaler and needs a refill as soon as possible ; so she wants the refill sent to [facility] instead of [facility]. Thanks

## 2017-08-21 ENCOUNTER — Encounter: Payer: Self-pay | Admitting: Internal Medicine

## 2017-08-22 ENCOUNTER — Encounter (INDEPENDENT_AMBULATORY_CARE_PROVIDER_SITE_OTHER): Payer: Self-pay | Admitting: Physical Medicine and Rehabilitation

## 2017-08-28 ENCOUNTER — Other Ambulatory Visit: Payer: Self-pay | Admitting: Internal Medicine

## 2017-08-28 DIAGNOSIS — G894 Chronic pain syndrome: Secondary | ICD-10-CM

## 2017-08-28 NOTE — Telephone Encounter (Signed)
Patient is requesting a refill on all her medications, pls send them to [facility].

## 2017-08-29 MED ORDER — RANITIDINE HCL 150 MG PO TABS: 150.0000 mg | ORAL_TABLET | Freq: Two times a day (BID) | ORAL | 1 refills | Status: DC

## 2017-08-29 MED ORDER — AMLODIPINE BESYLATE 5 MG PO TABS: 5.0000 mg | ORAL_TABLET | Freq: Every day | ORAL | 5 refills | Status: DC

## 2017-08-29 MED ORDER — GABAPENTIN 300 MG PO CAPS: 300.0000 mg | ORAL_CAPSULE | Freq: Every day | ORAL | 3 refills | Status: DC

## 2017-08-29 MED ORDER — CYCLOBENZAPRINE HCL 10 MG PO TABS: 10.0000 mg | ORAL_TABLET | Freq: Two times a day (BID) | ORAL | 3 refills | Status: DC | PRN

## 2017-09-11 ENCOUNTER — Encounter: Payer: Self-pay | Admitting: Internal Medicine

## 2017-09-11 ENCOUNTER — Ambulatory Visit (INDEPENDENT_AMBULATORY_CARE_PROVIDER_SITE_OTHER): Payer: Self-pay | Admitting: Internal Medicine

## 2017-09-11 ENCOUNTER — Other Ambulatory Visit: Payer: Self-pay

## 2017-09-11 VITALS — BP 123/75 | HR 59 | Temp 97.9°F | Ht 63.0 in | Wt 145.0 lb

## 2017-09-11 DIAGNOSIS — Z79899 Other long term (current) drug therapy: Secondary | ICD-10-CM

## 2017-09-11 DIAGNOSIS — N941 Unspecified dyspareunia: Secondary | ICD-10-CM

## 2017-09-11 DIAGNOSIS — F329 Major depressive disorder, single episode, unspecified: Secondary | ICD-10-CM

## 2017-09-11 DIAGNOSIS — N949 Unspecified condition associated with female genital organs and menstrual cycle: Secondary | ICD-10-CM

## 2017-09-11 DIAGNOSIS — M542 Cervicalgia: Secondary | ICD-10-CM

## 2017-09-11 DIAGNOSIS — K769 Liver disease, unspecified: Secondary | ICD-10-CM

## 2017-09-11 DIAGNOSIS — M549 Dorsalgia, unspecified: Secondary | ICD-10-CM

## 2017-09-11 DIAGNOSIS — N9489 Other specified conditions associated with female genital organs and menstrual cycle: Secondary | ICD-10-CM

## 2017-09-11 DIAGNOSIS — M79602 Pain in left arm: Secondary | ICD-10-CM

## 2017-09-11 DIAGNOSIS — I10 Essential (primary) hypertension: Secondary | ICD-10-CM

## 2017-09-11 DIAGNOSIS — M79601 Pain in right arm: Secondary | ICD-10-CM

## 2017-09-11 DIAGNOSIS — G8929 Other chronic pain: Secondary | ICD-10-CM

## 2017-09-11 NOTE — Patient Instructions (Signed)
You should be called about a pelvic ultrasound and MRI of your liver as we discussed today, to follow up on abnormalities that were found on imaging last year. If you don't hear about setting up these appointments in the next week, please call our clinic.   Your blood pressure is doing great! Continue taking HCTZ and amlodipine. We'll plan on seeing you in about 6 months, or earlier if you need [place]!

## 2017-09-11 NOTE — Progress Notes (Signed)
   CC: hypertension  HPI:  [name] is a 52 y.o. with a PMH of HTN, MDD, cervicalgia and chronic back pain presenting to clinic for follow up on her HTN.    Please see problem based Assessment and Plan for status of patients chronic conditions.  Past Medical History:  Diagnosis Date  . Arthritis    hip  . Asthma   . Carpal tunnel syndrome    bilateral  . Chronic lower back pain   . Depression   . Dyspnea    with exertion  . GERD (gastroesophageal reflux disease)   . Headache    migraines  . Herpes   . Hypertension   . SVD (spontaneous vaginal delivery)    x 2  . Uterine leiomyoma 02/16/2016    Review of Systems:   ROS  Patient denies headache, vision or hearing changes Patient endorses improvement in bil arm pain however still present R>L Patient denies chest pain, shortness of breath Patient denies abd pain, nausea, vomiting Patient endorses dyspareunia, denies vaginal discharge/bleeding/known STD exposure   Physical Exam:  Vitals:   09/11/17 1423  BP: 123/75  Pulse: (!) 59  Temp: 97.9 F (36.6 C)  TempSrc: Oral  SpO2: 100%  Weight: 145 lb (65.8 kg)  Height: 5\' 3"  (1.6 m)   GENERAL- alert, co-operative, appears as stated age, not in any distress. HEENT- EOMI, oral mucosa appears moist CARDIAC- RRR, no murmurs, rubs or gallops. RESP- Moving equal volumes of air, and clear to auscultation bilaterally, no wheezes or crackles. ABDOMEN- Soft, nontender, bowel sounds present. EXTREMITIES- pulse 2+, symmetric, no pedal edema. SKIN- Warm, dry PSYCH- Normal mood and affect, appropriate thought content and speech.  Assessment & Plan:   HTN (hypertension) Well controlled. Patient compliant with recent addition of amlodipine 5mg  daily to her HCTZ 25mg  daily. Asymptomatic.  Plan: --continue current regimen  Liver lesion, right lobe Patient with incidental finding of liver lesions (7/17 right lobe with 2.0x1.7cm lesion and dome with 1x1cm lesion;  8/18 right lobe with 1.3cm lesion) with recommendation of f/u with MRI. Discussed findings and recommendations with patient; she is in agreement with MRI; denies anxiety with MRIs in the past.  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 Recommendation for repeat U/S.  Discussed findings and recommendations with patient; she is in agreement with proceeding with recommended U/S.  Plan: --f/u pelvic/transvaginal u/s  Dyspareunia in female Patient with 1wk h/o dyspareunia with reported vaginal dryness; she denies vaginal discharge, bleeding; she denies concerns for contracting STD. Declines pelvic exam today.  Plan: --discussed use of vaginal lubricants --discussed return precautions if she were to develop abn vaginal discharge or worsening symptoms.   See Encounters Tab for problem based charting.   Patient discussed with Dr. Verdie Drown, MD Internal Medicine PGY2

## 2017-09-12 ENCOUNTER — Ambulatory Visit (INDEPENDENT_AMBULATORY_CARE_PROVIDER_SITE_OTHER): Payer: Self-pay | Admitting: Orthopaedic Surgery

## 2017-09-13 ENCOUNTER — Encounter: Payer: Self-pay | Admitting: Internal Medicine

## 2017-09-13 DIAGNOSIS — N941 Unspecified dyspareunia: Secondary | ICD-10-CM | POA: Insufficient documentation

## 2017-09-13 NOTE — Assessment & Plan Note (Signed)
Patient with incidental finding of liver lesions (7/17 right lobe with 2.0x1.7cm lesion and dome with 1x1cm lesion; 8/18 right lobe with 1.3cm lesion) with recommendation of f/u with MRI. Discussed findings and recommendations with patient; she is in agreement with MRI; denies anxiety with MRIs in the past.  Plan: --f/u MRI abd w/wo contrast

## 2017-09-13 NOTE — Assessment & Plan Note (Signed)
Well controlled. Patient compliant with recent addition of amlodipine 5mg  daily to her HCTZ 25mg  daily. Asymptomatic.  Plan: --continue current regimen

## 2017-09-13 NOTE — Assessment & Plan Note (Addendum)
Patient with 1wk h/o dyspareunia with reported vaginal dryness; she denies vaginal discharge, bleeding; she denies concerns for contracting STD. Declines pelvic exam today.  Plan: --discussed use of vaginal lubricants --discussed return precautions if she were to develop abn vaginal discharge or worsening symptoms.

## 2017-09-13 NOTE — Assessment & Plan Note (Signed)
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 Recommendation for repeat U/S.  Discussed findings and recommendations with patient; she is in agreement with proceeding with recommended U/S.  Plan: --f/u pelvic/transvaginal u/s

## 2017-09-16 NOTE — Progress Notes (Signed)
Medicine attending: Medical history, presenting problems, physical findings, and medications, reviewed with resident physician Dr Benny Lennert on the day of the patient visit and I concur with her evaluation and management plan.

## 2017-09-23 ENCOUNTER — Ambulatory Visit: Payer: Self-pay | Admitting: Sports Medicine

## 2017-09-25 ENCOUNTER — Ambulatory Visit (HOSPITAL_COMMUNITY)
Admission: RE | Admit: 2017-09-25 | Discharge: 2017-09-25 | Disposition: A | Discharge status: Home / Self Care | Payer: Self-pay | Source: Ambulatory Visit | Attending: Internal Medicine | Admitting: Internal Medicine

## 2017-09-25 DIAGNOSIS — D259 Leiomyoma of uterus, unspecified: Secondary | ICD-10-CM

## 2017-09-25 DIAGNOSIS — N949 Unspecified condition associated with female genital organs and menstrual cycle: Secondary | ICD-10-CM | POA: Insufficient documentation

## 2017-09-25 DIAGNOSIS — N9489 Other specified conditions associated with female genital organs and menstrual cycle: Secondary | ICD-10-CM

## 2017-09-26 ENCOUNTER — Encounter (INDEPENDENT_AMBULATORY_CARE_PROVIDER_SITE_OTHER): Payer: Self-pay | Admitting: Physical Medicine and Rehabilitation

## 2017-09-27 ENCOUNTER — Telehealth: Payer: Self-pay

## 2017-09-27 NOTE — Telephone Encounter (Signed)
Needs to speak with a nurse about tooth pain and bp. Please call pt back.

## 2017-09-27 NOTE — Telephone Encounter (Signed)
rtc to pt, she states her tooth broke off, her head hurts so she knows her bp is up, she has taken her medicine and ibu and feels bad still, she is ask to go to urg care here at [facility], states she will do so now. Call ended

## 2017-10-02 ENCOUNTER — Telehealth: Payer: Self-pay

## 2017-10-02 NOTE — Telephone Encounter (Signed)
Requesting ultrasound result. Please call back.

## 2017-10-03 ENCOUNTER — Telehealth: Payer: Self-pay | Admitting: Internal Medicine

## 2017-10-03 DIAGNOSIS — N9489 Other specified conditions associated with female genital organs and menstrual cycle: Secondary | ICD-10-CM

## 2017-10-03 NOTE — Telephone Encounter (Signed)
Attempted calling patient at both listed numbers to discuss pelvic ultrasound results which show a persistent right adnexal mass that is mildly increased to 5.7x3.8x4.2cm from 5.4x3.9x3.6cm previous CT with normal doppler blood flow noted.   Please call patient to let her know the right ovary mass may have slightly increased. From the radiology read they suggested it might be a remnant fibroid. I have placed a referral for her to see gynecology again.  Alphonzo Grieve, MD IMTS - PGY2

## 2017-10-03 NOTE — Telephone Encounter (Signed)
Patient is requesting results on ultrasound

## 2017-10-04 ENCOUNTER — Telehealth: Payer: Self-pay | Admitting: Internal Medicine

## 2017-10-04 NOTE — Telephone Encounter (Addendum)
Spoke with patient and informed her that the right ovary mass may have slightly increased-might be a remnant fibroid and her pcp has placed a referral for her to see gynecology again.  I asked if she had any questions and the call disconnected, attempted to call back-unable to get through, but will close note at this time as pt was given info regarding referral.Jean July Cassady5/31/201912:33 PM

## 2017-10-04 NOTE — Telephone Encounter (Signed)
Patient is calling back regarding ultrasound results

## 2017-10-04 NOTE — Telephone Encounter (Signed)
Thank you!  Jean [name] 

## 2017-10-04 NOTE — Telephone Encounter (Signed)
Spoke with pt earlier regarding gyn referral and ovarian mass (see previous phone note).  Pt also wanted to know why she was getting the MRI abdomen-states this was discussed during her visit, but she really didn't understand.  Pt informed that of liver lesion and MD wanted to get a closer look to determine what it is.  Pt states that she has anxiety and worried about what it could be.  Pt instructed to keep CT Abd appt on 10/10/17 and she agreed.Regenia Skeeter, Darlene Cassady5/31/20194:42 PM

## 2017-10-07 ENCOUNTER — Telehealth (INDEPENDENT_AMBULATORY_CARE_PROVIDER_SITE_OTHER): Payer: Self-pay | Admitting: *Deleted

## 2017-10-07 ENCOUNTER — Other Ambulatory Visit (INDEPENDENT_AMBULATORY_CARE_PROVIDER_SITE_OTHER): Payer: Self-pay

## 2017-10-07 DIAGNOSIS — M542 Cervicalgia: Secondary | ICD-10-CM

## 2017-10-07 NOTE — Telephone Encounter (Signed)
Done

## 2017-10-07 NOTE — Telephone Encounter (Signed)
-----   Message from Anise Salvo, [place] sent at 10/04/2017 10:08 AM EDT ----- Regarding: FW: Nerve Study Can we refer her somewhere else  ----- Message ----- From: Sherre Scarlet, RT Sent: 10/04/2017   9:32 AM To: Anise Salvo, RMA Subject: Nerve Study                                    Patient called wanting to reschedule her nerve study. She has been a no show twice for this. Per Dr. Ernestina Patches, she will need to be referred somewhere else for a nerve study if she still needs it.

## 2017-10-07 NOTE — Telephone Encounter (Signed)
Please place new order for nerve study, previous has been closed, can not use. Thanks

## 2017-10-10 ENCOUNTER — Ambulatory Visit (INDEPENDENT_AMBULATORY_CARE_PROVIDER_SITE_OTHER): Payer: Self-pay | Admitting: Physician Assistant

## 2017-10-10 ENCOUNTER — Ambulatory Visit (HOSPITAL_COMMUNITY)
Admission: RE | Admit: 2017-10-10 | Discharge: 2017-10-10 | Disposition: A | Discharge status: Home / Self Care | Payer: Self-pay | Source: Ambulatory Visit | Attending: Internal Medicine | Admitting: Internal Medicine

## 2017-10-10 ENCOUNTER — Ambulatory Visit: Payer: Self-pay

## 2017-10-10 ENCOUNTER — Encounter: Payer: Self-pay | Admitting: Internal Medicine

## 2017-10-10 DIAGNOSIS — K769 Liver disease, unspecified: Secondary | ICD-10-CM | POA: Insufficient documentation

## 2017-10-10 LAB — CREATININE, SERUM
CREATININE: 1.26 mg/dL — AB (ref 0.44–1.00)
GFR calc Af Amer: 55 mL/min — ABNORMAL LOW (ref >60)
GFR calc non Af Amer: 47 mL/min — ABNORMAL LOW (ref >60)

## 2017-10-10 MED ORDER — GADOBENATE DIMEGLUMINE 529 MG/ML IV SOLN
15.0000 mL | Freq: Once | INTRAVENOUS | Status: AC
  Administered 2017-10-10: 12 mL via INTRAVENOUS

## 2017-10-11 ENCOUNTER — Other Ambulatory Visit: Payer: Self-pay | Admitting: *Deleted

## 2017-10-11 DIAGNOSIS — R0609 Other forms of dyspnea: Principal | ICD-10-CM

## 2017-10-11 NOTE — Telephone Encounter (Signed)
This was addressed in separate phone encounter on 10/03/2017. Hubbard Hartshorn, RN, BSN

## 2017-10-11 NOTE — Telephone Encounter (Addendum)
Patient called in for the following reasons:  1. Requesting results from MRI abdomen done 10/10/2017  2. Needs dentist for painful broken teeth. Patient has [place]. Instructed to look on orange card for dentist to see. Unfortunately, dental clinic is currently being renovated and is not open.  3. Requesting refill on ibuprofen for teeth pain as well as albuterol to [facility]  4. Has not heard from GYN referral. Instructed to call [phone] to schedule appt.  Hubbard Hartshorn, RN, BSN

## 2017-10-14 MED ORDER — IBUPROFEN 600 MG PO TABS: 600.0000 mg | ORAL_TABLET | Freq: Four times a day (QID) | ORAL | 0 refills | Status: DC | PRN

## 2017-10-14 MED ORDER — ALBUTEROL SULFATE HFA 108 (90 BASE) MCG/ACT IN AERS: 2.0000 | INHALATION_SPRAY | Freq: Four times a day (QID) | RESPIRATORY_TRACT | 0 refills | Status: DC | PRN

## 2017-10-14 NOTE — Telephone Encounter (Signed)
Discussed results of MRI abdomen with patient - obtained as f/u for incidental findings of liver abnormalities on CT last year. MRI shows benign lesions likely combination of benign cavernous hemangiomas or small benign cysts; no f/u recommended. Patient relieved with information.  She is still waiting for gyn appointment for ?right adnexal mass that is possibly a remnant fibroid. She was given phone number by our RN to call to help facilitate appointment.   Patient states she feels her anxiety is not as controlled currently between her medical problems; she states compliance with zoloft 100mg  daily. She is interested in counseling - I gave her information for [facility]. I advised that we would also be available for her for an appointment to specifically address this if she would like. She was appreciative.   Alphonzo Grieve, MD IMTS - PGY2

## 2017-10-17 ENCOUNTER — Ambulatory Visit (INDEPENDENT_AMBULATORY_CARE_PROVIDER_SITE_OTHER): Payer: Self-pay | Admitting: Physician Assistant

## 2017-10-18 ENCOUNTER — Telehealth: Payer: Self-pay | Admitting: Internal Medicine

## 2017-10-18 NOTE — Telephone Encounter (Signed)
Called 3 ph#'s, one could not accept the message, next said text me, the next a female answered and stated when he saw her he would have her call

## 2017-10-18 NOTE — Telephone Encounter (Signed)
Patient is wanting let nurse know that she can't get out of bed, pls call

## 2017-10-22 ENCOUNTER — Encounter: Payer: Self-pay | Admitting: Obstetrics & Gynecology

## 2017-10-22 NOTE — Telephone Encounter (Signed)
Have tried to call again, no answer

## 2017-11-06 ENCOUNTER — Encounter (INDEPENDENT_AMBULATORY_CARE_PROVIDER_SITE_OTHER): Payer: Self-pay | Admitting: Orthopaedic Surgery

## 2017-11-06 ENCOUNTER — Ambulatory Visit (INDEPENDENT_AMBULATORY_CARE_PROVIDER_SITE_OTHER): Payer: Self-pay | Admitting: Orthopaedic Surgery

## 2017-11-06 ENCOUNTER — Other Ambulatory Visit (INDEPENDENT_AMBULATORY_CARE_PROVIDER_SITE_OTHER): Payer: Self-pay

## 2017-11-06 DIAGNOSIS — M25561 Pain in right knee: Secondary | ICD-10-CM

## 2017-11-06 DIAGNOSIS — G8929 Other chronic pain: Secondary | ICD-10-CM

## 2017-11-06 NOTE — Progress Notes (Signed)
Office Visit Note   Patient: Jean [name]           Date of Birth: Dec 05, 1965           MRN: [id number] Visit Date: 11/06/2017              Requested by: Alphonzo Grieve, MD [address] [place], [place] [address] PCP: Alphonzo Grieve, MD   Assessment & Plan: Visit Diagnoses:  1. Pain of right hip joint   2. Status post left hip replacement     Plan: Due to her instability symptoms with locking catching in her right knee and continued swelling, I will place her in a hinged knee brace for support today.  She will continue using her cane as well.  At this point an MRI is warranted to rule out a meniscal tear given her continued falls with instability of that knee.  All questions concerns were answered and addressed.  We will see her back once the MRI is obtained.  Follow-Up Instructions:  Follow-up after MRI  Orders:  No orders of the defined types were placed in this encounter.  No orders of the defined types were placed in this encounter.     Procedures: No procedures performed   Clinical Data: No additional findings.   Subjective: Chief Complaint  Patient presents with  . Right Hip - Follow-up  . Right Knee - Follow-up  The patient is someone I seen before.  She comes in with continued right knee pain with locking catching.  She has had 2 times recently where she has fallen.  She says the knee locks on her and gives way and feels unstable.  She ablates using a cane.  She is a very thin individual.  She gets a lot of lateral knee swelling as well she states.  She has had a history of a left total hip arthroplasty done 22 months ago.  HPI  Review of Systems She currently denies any headache, chest pain, shortness of breath, fever, chills, nausea, vomiting.  Objective: Vital Signs: LMP 12/08/2016 (Approximate)   Physical Exam She is alert and oriented x3 in no acute distress Ortho Exam Examination of her right knee does show mild effusion.  She has a lot of  guarding when I stressed the knee on exam.  It does not feel ligamentously stable.  She does have medial lateral joint line tenderness.  Once I start flexing her past 90 degrees she has severe pain in her right knee. Specialty Comments:  No specialty comments available.  Imaging: No results found.   PMFS History: Patient Active Problem List   Diagnosis Date Noted  . Dyspareunia in female 09/13/2017  . Adnexal mass 07/10/2017  . Spinal stenosis of cervical region 07/01/2017  . Cervicalgia 04/24/2017  . CKD (chronic kidney disease) 10/29/2016  . Chest wall pain 10/15/2016  . Chronic pain syndrome 05/10/2016  . Liver lesion, right lobe 12/01/2015  . MDD (major depressive disorder) 10/19/2015  . Dyspnea on exertion 08/22/2015  . HTN (hypertension) 07/08/2015  . Routine health maintenance 07/08/2015  . Tobacco abuse 07/08/2015   Past Medical History:  Diagnosis Date  . Arthritis    hip  . Asthma   . Carpal tunnel syndrome    bilateral  . Chronic lower back pain   . Depression   . Dyspnea    with exertion  . GERD (gastroesophageal reflux disease)   . Headache    migraines  . Herpes   . Hypertension   .  SVD (spontaneous vaginal delivery)    x 2  . Uterine leiomyoma 02/16/2016    Family History  Problem Relation Age of Onset  . Cancer Mother   . Hypertension Mother   . Diabetes Mother   . Stroke Father     Past Surgical History:  Procedure Laterality Date  . SALPINGECTOMY     lap - ectopic preg  . TOTAL HIP ARTHROPLASTY Left 01/20/2016   Procedure: LEFT TOTAL HIP ARTHROPLASTY ANTERIOR APPROACH;  Surgeon: Mcarthur Rossetti, MD;  Location: [facility];  Service: Orthopedics;  Laterality: Left;  Marland Kitchen VAGINAL HYSTERECTOMY Right 12/11/2016   Procedure: HYSTERECTOMY VAGINAL WITH RIGHT SALPINGECTOMY;  Surgeon: Chancy Milroy, MD;  Location: [facility];  Service: Gynecology;  Laterality: Right;   Social History   Occupational History  . Not on file  Tobacco Use  . Smoking  status: Light Tobacco Smoker    Packs/day: 0.25    Years: 11.00    Pack years: 2.75    Types: Cigarettes    Last attempt to quit: 11/28/2016    Years since quitting: 0.9  . Smokeless tobacco: Never Used  Substance and Sexual Activity  . Alcohol use: Yes    Alcohol/week: 1.2 oz    Types: 2 Cans of beer per week  . Drug use: No    Types: "Crack" cocaine, Cocaine  . Sexual activity: Yes    Partners: Male    Birth control/protection: None

## 2017-11-13 ENCOUNTER — Ambulatory Visit
Admission: RE | Admit: 2017-11-13 | Discharge: 2017-11-13 | Disposition: A | Discharge status: Home / Self Care | Payer: Self-pay | Source: Ambulatory Visit | Attending: Orthopaedic Surgery | Admitting: Orthopaedic Surgery

## 2017-11-13 DIAGNOSIS — M25561 Pain in right knee: Principal | ICD-10-CM

## 2017-11-13 DIAGNOSIS — G8929 Other chronic pain: Secondary | ICD-10-CM

## 2017-11-18 ENCOUNTER — Emergency Department (HOSPITAL_COMMUNITY): Payer: Self-pay

## 2017-11-18 ENCOUNTER — Encounter (HOSPITAL_COMMUNITY): Payer: Self-pay | Admitting: Emergency Medicine

## 2017-11-18 ENCOUNTER — Ambulatory Visit (INDEPENDENT_AMBULATORY_CARE_PROVIDER_SITE_OTHER): Payer: Self-pay | Admitting: Obstetrics & Gynecology

## 2017-11-18 ENCOUNTER — Encounter: Payer: Self-pay | Admitting: Obstetrics & Gynecology

## 2017-11-18 ENCOUNTER — Emergency Department (HOSPITAL_COMMUNITY)
Admission: EM | Admit: 2017-11-18 | Discharge: 2017-11-18 | Disposition: A | Discharge status: Home / Self Care | Payer: Self-pay | Attending: Emergency Medicine | Admitting: Emergency Medicine

## 2017-11-18 VITALS — BP 211/100 | HR 44 | Ht 63.0 in | Wt 145.0 lb

## 2017-11-18 DIAGNOSIS — I161 Hypertensive emergency: Secondary | ICD-10-CM

## 2017-11-18 DIAGNOSIS — Z711 Person with feared health complaint in whom no diagnosis is made: Secondary | ICD-10-CM

## 2017-11-18 DIAGNOSIS — F1490 Cocaine use, unspecified, uncomplicated: Secondary | ICD-10-CM

## 2017-11-18 DIAGNOSIS — F1499 Cocaine use, unspecified with unspecified cocaine-induced disorder: Secondary | ICD-10-CM | POA: Insufficient documentation

## 2017-11-18 DIAGNOSIS — N9489 Other specified conditions associated with female genital organs and menstrual cycle: Secondary | ICD-10-CM

## 2017-11-18 DIAGNOSIS — Z96642 Presence of left artificial hip joint: Secondary | ICD-10-CM | POA: Insufficient documentation

## 2017-11-18 DIAGNOSIS — Z7982 Long term (current) use of aspirin: Secondary | ICD-10-CM | POA: Insufficient documentation

## 2017-11-18 DIAGNOSIS — N949 Unspecified condition associated with female genital organs and menstrual cycle: Secondary | ICD-10-CM

## 2017-11-18 DIAGNOSIS — J45909 Unspecified asthma, uncomplicated: Secondary | ICD-10-CM | POA: Insufficient documentation

## 2017-11-18 DIAGNOSIS — I10 Essential (primary) hypertension: Secondary | ICD-10-CM

## 2017-11-18 DIAGNOSIS — N189 Chronic kidney disease, unspecified: Secondary | ICD-10-CM | POA: Insufficient documentation

## 2017-11-18 DIAGNOSIS — R51 Headache: Secondary | ICD-10-CM | POA: Insufficient documentation

## 2017-11-18 DIAGNOSIS — F17210 Nicotine dependence, cigarettes, uncomplicated: Secondary | ICD-10-CM | POA: Insufficient documentation

## 2017-11-18 DIAGNOSIS — I129 Hypertensive chronic kidney disease with stage 1 through stage 4 chronic kidney disease, or unspecified chronic kidney disease: Secondary | ICD-10-CM | POA: Insufficient documentation

## 2017-11-18 DIAGNOSIS — R4701 Aphasia: Secondary | ICD-10-CM | POA: Insufficient documentation

## 2017-11-18 DIAGNOSIS — Z79899 Other long term (current) drug therapy: Secondary | ICD-10-CM | POA: Insufficient documentation

## 2017-11-18 DIAGNOSIS — R2990 Unspecified symptoms and signs involving the nervous system: Secondary | ICD-10-CM

## 2017-11-18 LAB — I-STAT TROPONIN, ED: Troponin i, poc: 0 ng/mL (ref 0.00–0.08)

## 2017-11-18 LAB — URINALYSIS, ROUTINE W REFLEX MICROSCOPIC
BILIRUBIN URINE: NEGATIVE
Bacteria, UA: NONE SEEN
Glucose, UA: NEGATIVE mg/dL
Ketones, ur: NEGATIVE mg/dL
Leukocytes, UA: NEGATIVE
Nitrite: NEGATIVE
PH: 6 (ref 5.0–8.0)
Protein, ur: NEGATIVE mg/dL
SPECIFIC GRAVITY, URINE: 1.024 (ref 1.005–1.030)

## 2017-11-18 LAB — COMPREHENSIVE METABOLIC PANEL
ALBUMIN: 3.6 g/dL (ref 3.5–5.0)
ALT: 11 U/L (ref 0–44)
AST: 14 U/L — ABNORMAL LOW (ref 15–41)
Alkaline Phosphatase: 75 U/L (ref 38–126)
Anion gap: 8 (ref 5–15)
BUN: 22 mg/dL — ABNORMAL HIGH (ref 6–20)
CHLORIDE: 111 mmol/L (ref 98–111)
CO2: 22 mmol/L (ref 22–32)
Calcium: 9.2 mg/dL (ref 8.9–10.3)
Creatinine, Ser: 1.45 mg/dL — ABNORMAL HIGH (ref 0.44–1.00)
GFR calc Af Amer: 46 mL/min — ABNORMAL LOW (ref >60)
GFR calc non Af Amer: 40 mL/min — ABNORMAL LOW (ref >60)
GLUCOSE: 91 mg/dL (ref 70–99)
POTASSIUM: 4.1 mmol/L (ref 3.5–5.1)
Sodium: 141 mmol/L (ref 135–145)
Total Bilirubin: 0.5 mg/dL (ref 0.3–1.2)
Total Protein: 7.4 g/dL (ref 6.5–8.1)

## 2017-11-18 LAB — DIFFERENTIAL
ABS IMMATURE GRANULOCYTES: 0 10*3/uL (ref 0.0–0.1)
BASOS PCT: 1 %
Basophils Absolute: 0.1 10*3/uL (ref 0.0–0.1)
EOS ABS: 0.4 10*3/uL (ref 0.0–0.7)
Eosinophils Relative: 5 %
IMMATURE GRANULOCYTES: 0 %
Lymphocytes Relative: 44 %
Lymphs Abs: 3.3 10*3/uL (ref 0.7–4.0)
Monocytes Absolute: 0.6 10*3/uL (ref 0.1–1.0)
Monocytes Relative: 8 %
NEUTROS PCT: 42 %
Neutro Abs: 3.1 10*3/uL (ref 1.7–7.7)

## 2017-11-18 LAB — CBC
HCT: 38.8 % (ref 36.0–46.0)
HEMOGLOBIN: 12.4 g/dL (ref 12.0–15.0)
MCH: 32.7 pg (ref 26.0–34.0)
MCHC: 32 g/dL (ref 30.0–36.0)
MCV: 102.4 fL — AB (ref 78.0–100.0)
PLATELETS: 371 10*3/uL (ref 150–400)
RBC: 3.79 MIL/uL — AB (ref 3.87–5.11)
RDW: 13.7 % (ref 11.5–15.5)
WBC: 7.4 10*3/uL (ref 4.0–10.5)

## 2017-11-18 LAB — I-STAT CHEM 8, ED
BUN: 26 mg/dL — AB (ref 6–20)
CALCIUM ION: 1.18 mmol/L (ref 1.15–1.40)
CHLORIDE: 109 mmol/L (ref 98–111)
CREATININE: 1.4 mg/dL — AB (ref 0.44–1.00)
Glucose, Bld: 87 mg/dL (ref 70–99)
HCT: 39 % (ref 36.0–46.0)
Hemoglobin: 13.3 g/dL (ref 12.0–15.0)
Potassium: 4 mmol/L (ref 3.5–5.1)
Sodium: 140 mmol/L (ref 135–145)
TCO2: 23 mmol/L (ref 22–32)

## 2017-11-18 LAB — RAPID URINE DRUG SCREEN, HOSP PERFORMED
Amphetamines: NOT DETECTED
BENZODIAZEPINES: NOT DETECTED
Cocaine: POSITIVE — AB
Opiates: NOT DETECTED
TETRAHYDROCANNABINOL: NOT DETECTED

## 2017-11-18 LAB — PROTIME-INR
INR: 0.91
Prothrombin Time: 12.2 seconds (ref 11.4–15.2)

## 2017-11-18 LAB — APTT: aPTT: 30 seconds (ref 24–36)

## 2017-11-18 LAB — I-STAT BETA HCG BLOOD, ED (MC, WL, AP ONLY): I-stat hCG, quantitative: <5 m[IU]/mL (ref <5)

## 2017-11-18 LAB — CBG MONITORING, ED: GLUCOSE-CAPILLARY: 83 mg/dL (ref 70–99)

## 2017-11-18 LAB — ETHANOL: Alcohol, Ethyl (B): <10 mg/dL (ref <10)

## 2017-11-18 MED ORDER — CLEVIDIPINE BUTYRATE 0.5 MG/ML IV EMUL
0.0000 mg/h | Freq: Once | INTRAVENOUS | Status: DC
  Filled 2017-11-18: qty 50

## 2017-11-18 MED ORDER — HYDROCHLOROTHIAZIDE 25 MG PO TABS: 25.0000 mg | ORAL_TABLET | Freq: Every day | ORAL | 0 refills | Status: DC

## 2017-11-18 MED ORDER — NICARDIPINE HCL IN NACL 20-0.86 MG/200ML-% IV SOLN: 3.0000 mg/h | Freq: Once | INTRAVENOUS | Status: DC

## 2017-11-18 MED ORDER — IOPAMIDOL (ISOVUE-370) INJECTION 76%
100.0000 mL | Freq: Once | INTRAVENOUS | Status: AC | PRN
  Administered 2017-11-18: 100 mL via INTRAVENOUS

## 2017-11-18 MED ORDER — HYDROCHLOROTHIAZIDE 25 MG PO TABS
25.0000 mg | ORAL_TABLET | Freq: Every day | ORAL | Status: DC
  Administered 2017-11-18: 25 mg via ORAL
  Filled 2017-11-18: qty 1

## 2017-11-18 NOTE — Consult Note (Signed)
NEURO HOSPITALIST      Requesting Physician: Dr. Darl Householder    Chief Complaint: aphasia  And dysarthria  History obtained from:  Patient   HPI:                                                                                                                                         Jean [name] is an 52 y.o. female with PMH asignificant for HTN, drug abuse (cocaine), and HA presents to [facility] as a code stroke .  Patient went to bed at 2300 last night and was in her usual state of health. At 0500 this  Morning she was dizzy while going to the bathroom. She also states that she had/has a HA when she went to the OBGYN today. Patient states she had a partial hysterectomy in 2018 and was at the [facility] today for consultation regarding fibroids and having her ovaries removed. The OBGYN intially called because patient blood pressure was in the 220s, then they noted the dysarthria. Patient states that she has not been taking her BP medication for about 1 week because she ran out. She admits to smoking marijuana yesterday, drinking six 8oz beers. She also admits that she does crack when she is feeling depressed which was about 1 week ago. Endorses HA in left temporal area that she rates as 10/10 with eye pain. Pain radiates down to jaw and neck. She also thought she was supposed to be havign surgery this morning and may be having some anxiety because of possible surgery. Denies any SOB, CP, blurred vision, unusual weakness. Patient states that she has some nerve damage of the left leg and arm from a hip replacement that left her with deficits at baseline.  Denies taking daily aspirin or any blood thinners. Patient stated that she gets depressed sometimes because she wants to go home and see her mother. Stated that her mother is in heaven. Denies SI.    ED course: BP on arrival was 221/106 and BG was 88.CT head: ASPECTS 10, no acute intracranial hemorrhage, subtle  hyperdensity @ left ICA terminus. CTA head/neck and CTP: Negative for large vessel occlusion, and no atherosclerosis or stenosis identified in the head or neck. Suspect artifactual area of increased Tmax in the left inferiorfrontal gyrus. 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 Multiple incidental arterial anatomic variations. Urine tox screen pending BUN: 26 and creatinine 1.40    No previous stroke history  noted   Date last known well: Date: 11/17/2017 Time last known well: Time: 23:00 tPA Given: No: Contraindicated  Modified Rankin: Rankin Score=0  NIHSS:2 1a Level of Conscious:0 1b LOC Questions:0  1c LOC Commands: 0 2 Best Gaze: 0 3 Visual: 0 4 Facial Palsy: 0 5a Motor Arm - left: 1 ( jerking/trembling noted) 5b Motor Arm - Right:  6a Motor Leg - Left: 1 6b Motor Leg - Right:  7 Limb Ataxia: 0 8 Sensory0:  9 Best Language: 0 10 Dysarthria:0 11 Extinct. and Inattention:0 TOTAL: 2       Past Medical History:  Diagnosis Date  . Arthritis    hip  . Asthma   . Carpal tunnel syndrome    bilateral  . Chronic lower back pain   . Depression   . Dyspnea    with exertion  . GERD (gastroesophageal reflux disease)   . Headache    migraines  . Herpes   . Hypertension   . SVD (spontaneous vaginal delivery)    x 2  . Uterine leiomyoma 02/16/2016    Past Surgical History:  Procedure Laterality Date  . SALPINGECTOMY Left    lap - ectopic preg  . TOTAL HIP ARTHROPLASTY Left 01/20/2016   Procedure: LEFT TOTAL HIP ARTHROPLASTY ANTERIOR APPROACH;  Surgeon: Mcarthur Rossetti, MD;  Location: [facility];  Service: Orthopedics;  Laterality: Left;  Marland Kitchen VAGINAL HYSTERECTOMY Right 12/11/2016   Procedure: HYSTERECTOMY  VAGINAL WITH RIGHT SALPINGECTOMY;  Surgeon: Chancy Milroy, MD;  Location: [facility];  Service: Gynecology;  Laterality: Right;    Family History  Problem Relation Age of Onset  . Cancer Mother   . Hypertension Mother   . Diabetes Mother   . Stroke Father          Social History:  reports that she has been smoking cigarettes.  She has a 2.75 pack-year smoking history. She has never used smokeless tobacco. She reports that she drinks about 1.2 oz of alcohol per week. She reports that she does not use drugs.  Allergies:  Allergies  Allergen Reactions  . Lisinopril Swelling    Lip swelling, patient reported - unable to confirm from chart     Medications:                                                                                                                           Current Facility-Administered Medications  Medication Dose Route Frequency Provider Last Rate Last Dose  . nicardipine (CARDENE) 20mg  in 0.86% saline 225ml IV infusion (0.1 mg/ml)  3-15 mg/hr Intravenous Once Drenda Freeze, MD       Current Outpatient Medications  Medication Sig Dispense Refill  . albuterol (PROVENTIL HFA;VENTOLIN HFA) 108 (90 Base) MCG/ACT inhaler Inhale 2 puffs into the lungs every 6 (six) hours as needed for wheezing or shortness of breath. (Patient not taking: Reported on 11/18/2017) 18 g 0  . amLODipine (NORVASC) 5 MG tablet Take 1 tablet (5  mg total) by mouth daily. 30 tablet 5  . cyclobenzaprine (FLEXERIL) 10 MG tablet Take 1 tablet (10 mg total) by mouth 2 (two) times daily as needed for muscle spasms. 30 tablet 3  . gabapentin (NEURONTIN) 300 MG capsule Take 1 capsule (300 mg total) by mouth at bedtime. 30 capsule 3  . hydrochlorothiazide (HYDRODIURIL) 25 MG tablet Take 1 tablet (25 mg total) by mouth daily. 90 tablet 2  . ibuprofen (IBU) 600 MG tablet Take 1 tablet (600 mg total) by mouth every 6 (six) hours as needed. 30 tablet 0  . ranitidine (ZANTAC) 150 MG tablet Take 1 tablet (150  mg total) by mouth 2 (two) times daily. 180 tablet 1  . sertraline (ZOLOFT) 50 MG tablet Take 2 tablets (100 mg total) by mouth daily. 60 tablet 11    ROS:                                                                                                                                       History obtained from the patient  General ROS: negative for - chills, fatigue, fever, night sweats, weight gain or weight loss Psychological ROS: negative for - , hallucinations, memory difficulties, mood swings or  Ophthalmic ROS: negative for - blurry vision, double vision, eye pain or loss of vision Respiratory ROS: negative for - cough,  shortness of breath or wheezing Cardiovascular ROS: negative for - chest pain, dyspnea on exertion,  Musculoskeletal ROS: negative for - joint swelling or muscular weakness Neurological ROS: as noted in HPI   General Examination:                                                                                                      Blood pressure (!) 221/106, pulse (!) 52, temperature 98.3 F (36.8 C), temperature source Oral, last menstrual period 12/08/2016, SpO2 98 %.  HEENT-  Normocephalic, no lesions, without obvious abnormality.  Normal external eye and conjunctiva.  Cardiovascular- S1-S2 audible, pulses palpable throughout   Lungs-no rhonchi or wheezing noted, no excessive working breathing.  Saturations within normal limits on RA Extremities- Warm, dry and intact Musculoskeletal-no joint tenderness, deformity or swelling Skin-warm and dry,intact  Neurological Examination Mental Status: Alert, oriented, person/place/year/month/event.  Speech fluent without evidence of aphasia.  Able to follow  commands without difficulty. Cranial Nerves: II:; Visual fields grossly normal,  III,IV, VI:  extra-ocular motions intact bilaterally, pupils equal, round, reactive to light and accommodation V,VII: smile symmetric, facial light touch sensation  normal  bilaterally VIII: hearing normal bilaterally IX,X: uvula rises symmetrically XI: bilateral shoulder shrug XII: midline tongue extension Motor: Right : Upper extremity   5/5    Left:     Upper extremity   5/5  Lower extremity   5/5     Lower extremity   5/5 Tone and bulk:normal tone throughout; no atrophy noted Sensory: light touch intact throughout, bilaterally Deep Tendon Reflexes: 2+ and symmetric biceps and patellar Plantars: Right: downgoing   Left: downgoing Cerebellar: Slow  finger-to-nose on left side,  normal heel-to-shin test Gait: deferred   Lab Results: Basic Metabolic Panel: Recent Labs  Lab 11/18/17 0927 11/18/17 0934  NA 141 140  K 4.1 4.0  CL 111 109  CO2 22  --   GLUCOSE 91 87  BUN 22* 26*  CREATININE 1.45* 1.40*  CALCIUM 9.2  --     CBC: Recent Labs  Lab 11/18/17 0927 11/18/17 0934  WBC 7.4  --   NEUTROABS 3.1  --   HGB 12.4 13.3  HCT 38.8 39.0  MCV 102.4*  --   PLT 371  --     Imaging: Ct Angio Head W Or Wo Contrast  Result Date: 11/18/2017 CLINICAL DATA:  52 year old female with symptoms initially of severe headache, subsequent mild aphasia. Hypertensive on presentation (systolic greater than 998). EXAM: CT ANGIOGRAPHY HEAD AND NECK CT PERFUSION BRAIN TECHNIQUE: Multidetector CT imaging of the head and neck was performed using the standard protocol during bolus administration of intravenous contrast. Multiplanar CT image reconstructions and MIPs were obtained to evaluate the vascular anatomy. Carotid stenosis measurements (when applicable) are obtained utilizing NASCET criteria, using the distal internal carotid diameter as the denominator. Multiphase CT imaging of the brain was performed following IV bolus contrast injection. Subsequent parametric perfusion maps were calculated using RAPID software. CONTRAST:  164mL ISOVUE-370 IOPAMIDOL (ISOVUE-370) INJECTION 76% COMPARISON:  Noncontrast head CT 0939 hours today. FINDINGS: CT Brain Perfusion  Findings: CBF (<30%) Volume: zero Perfusion (Tmax>6.0s) volume: 7 milliliters; although limited to the left inferior frontal gyrus along the skull base, suspicious for artifact Mismatch Volume: 7 milliliters Infarction Location:Not applicable CTA NECK Skeleton: Intermittent carious dentition. No acute osseous abnormality identified. Upper chest: Mild biapical lung scarring with pleural thickening. No superior mediastinal lymphadenopathy. Other neck: Negative.  No neck mass or lymphadenopathy. Aortic arch: 4 vessel arch configuration, the left vertebral artery arises directly from the arch. No arch atherosclerosis or great vessel origin stenosis. Right carotid system: Negative aside from tortuous right ICA just below the skull base with a kinked appearance. Mild associated vessel irregularity in that region, but no discrete atherosclerosis. Left carotid system: Negative aside from mild tortuosity and a subtle beaded appearance of the left ICA distal to the bulb, best demonstrated on series 5, images 86 and 87. No atherosclerosis or stenosis. Vertebral arteries: Normal proximal right subclavian artery and right vertebral artery origin. The right vertebral artery is dominant and tortuous, but patent to the skull base without stenosis. Non dominant left vertebral artery arises from the arch directly. It has a late entry into the cervical transverse foramen. Mild left vertebral artery tortuosity with no stenosis to the skull base. CTA HEAD Posterior circulation: Mildly dominant distal right vertebral artery. No distal vertebral stenosis. Patent right PICA origin. Tortuous vertebrobasilar junction without stenosis. Patent AICA origins, the left appears dominant. Tortuous basilar artery without stenosis. Fetal type bilateral PCA origins with a tortuous left P com and ectasia of the basilar artery tip. However, there  is no discrete aneurysm. Both SCA origins are patent. Bilateral PCA branches are patent. There is mild  distal left PCA branch irregularity. Anterior circulation: Both ICA siphons are patent without atherosclerosis or stenosis. Mild cavernous segment tortuosity. Normal ophthalmic and posterior communicating artery origins. Patent carotid termini. Tortuous left ICA terminus. Dominant left ACA, the right is diminutive, and this simulates azygos type ACA anatomy. The ACA branches are within normal limits. The left MCA M1 segment is tortuous without stenosis. Patent left MCA trifurcation with mild ectasia (series 7, image 111), but no discrete aneurysm. Left MCA branches are within normal limits. The right MCA M1 segment, bifurcation, and right MCA branches are within normal limits. Venous sinuses: Patent. Anatomic variants: Dominant right vertebral artery, the left arises directly from the arch. Fetal type PCA origins. Dominant left ACA, simulating azygos ACA anatomy. Review of the MIP images confirms the above findings IMPRESSION: 1. Negative for large vessel occlusion, and no atherosclerosis or stenosis identified in the head or neck. 2. Suspect artifactual area of increased Tmax in the left inferior frontal gyrus. No core infarct detected by CT Perfusion. 3. 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 4. Occasional intracranial artery ectasia, including at the left MCA trifurcation and at the basilar artery tip, but no discrete intracranial aneurysm. 5. Multiple incidental arterial anatomic variations. Preliminary report of #1 and #2 were discussed by telephone with Dr. Amie Portland on 11/18/2017 at 1007 hours. Final report salient findings were communicated at 10:24 amon via the [facility] messaging system. Electronically Signed   By: Genevie Ann M.D.   On: 11/18/2017 10:24   Ct Angio Neck W And/or Wo Contrast  Result Date: 11/18/2017 CLINICAL DATA:  52 year old female with symptoms initially of severe headache, subsequent mild aphasia. Hypertensive on  presentation (systolic greater than 245). EXAM: CT ANGIOGRAPHY HEAD AND NECK CT PERFUSION BRAIN TECHNIQUE: Multidetector CT imaging of the head and neck was performed using the standard protocol during bolus administration of intravenous contrast. Multiplanar CT image reconstructions and MIPs were obtained to evaluate the vascular anatomy. Carotid stenosis measurements (when applicable) are obtained utilizing NASCET criteria, using the distal internal carotid diameter as the denominator. Multiphase CT imaging of the brain was performed following IV bolus contrast injection. Subsequent parametric perfusion maps were calculated using RAPID software. CONTRAST:  152mL ISOVUE-370 IOPAMIDOL (ISOVUE-370) INJECTION 76% COMPARISON:  Noncontrast head CT 0939 hours today. FINDINGS: CT Brain Perfusion Findings: CBF (<30%) Volume: zero Perfusion (Tmax>6.0s) volume: 7 milliliters; although limited to the left inferior frontal gyrus along the skull base, suspicious for artifact Mismatch Volume: 7 milliliters Infarction Location:Not applicable CTA NECK Skeleton: Intermittent carious dentition. No acute osseous abnormality identified. Upper chest: Mild biapical lung scarring with pleural thickening. No superior mediastinal lymphadenopathy. Other neck: Negative.  No neck mass or lymphadenopathy. Aortic arch: 4 vessel arch configuration, the left vertebral artery arises directly from the arch. No arch atherosclerosis or great vessel origin stenosis. Right carotid system: Negative aside from tortuous right ICA just below the skull base with a kinked appearance. Mild associated vessel irregularity in that region, but no discrete atherosclerosis. Left carotid system: Negative aside from mild tortuosity and a subtle beaded appearance of the left ICA distal to the bulb, best demonstrated on series 5, images 86 and 87. No atherosclerosis or stenosis. Vertebral arteries: Normal proximal right subclavian artery and right vertebral artery  origin. The right vertebral artery is dominant and tortuous, but patent to the skull base without stenosis.  Non dominant left vertebral artery arises from the arch directly. It has a late entry into the cervical transverse foramen. Mild left vertebral artery tortuosity with no stenosis to the skull base. CTA HEAD Posterior circulation: Mildly dominant distal right vertebral artery. No distal vertebral stenosis. Patent right PICA origin. Tortuous vertebrobasilar junction without stenosis. Patent AICA origins, the left appears dominant. Tortuous basilar artery without stenosis. Fetal type bilateral PCA origins with a tortuous left P com and ectasia of the basilar artery tip. However, there is no discrete aneurysm. Both SCA origins are patent. Bilateral PCA branches are patent. There is mild distal left PCA branch irregularity. Anterior circulation: Both ICA siphons are patent without atherosclerosis or stenosis. Mild cavernous segment tortuosity. Normal ophthalmic and posterior communicating artery origins. Patent carotid termini. Tortuous left ICA terminus. Dominant left ACA, the right is diminutive, and this simulates azygos type ACA anatomy. The ACA branches are within normal limits. The left MCA M1 segment is tortuous without stenosis. Patent left MCA trifurcation with mild ectasia (series 7, image 111), but no discrete aneurysm. Left MCA branches are within normal limits. The right MCA M1 segment, bifurcation, and right MCA branches are within normal limits. Venous sinuses: Patent. Anatomic variants: Dominant right vertebral artery, the left arises directly from the arch. Fetal type PCA origins. Dominant left ACA, simulating azygos ACA anatomy. Review of the MIP images confirms the above findings IMPRESSION: 1. Negative for large vessel occlusion, and no atherosclerosis or stenosis identified in the head or neck. 2. Suspect artifactual area of increased Tmax in the left inferior frontal gyrus. No core infarct  detected by CT Perfusion. 3. 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 4. Occasional intracranial artery ectasia, including at the left MCA trifurcation and at the basilar artery tip, but no discrete intracranial aneurysm. 5. Multiple incidental arterial anatomic variations. Preliminary report of #1 and #2 were discussed by telephone with Dr. Amie Portland on 11/18/2017 at 1007 hours. Final report salient findings were communicated at 10:24 amon via the [facility] messaging system. Electronically Signed   By: Genevie Ann M.D.   On: 11/18/2017 10:24   Mr Brain Wo Contrast  Result Date: 11/18/2017 CLINICAL DATA:  Initial evaluation for acute onset slurred speech, left-sided weakness. EXAM: MRI HEAD WITHOUT CONTRAST TECHNIQUE: Multiplanar, multiecho pulse sequences of the brain and surrounding structures were obtained without intravenous contrast. COMPARISON:  Prior CT and CTA from earlier the same day. FINDINGS: Brain: Cerebral volume within normal limits for patient age. No focal parenchymal signal abnormality identified. No abnormal foci of restricted diffusion to suggest acute or subacute ischemia. Gray-white matter differentiation well maintained. No encephalomalacia to suggest chronic infarction. No foci of susceptibility artifact to suggest acute or chronic intracranial hemorrhage. Mass lesion, midline shift or mass effect. No hydrocephalus. No extra-axial fluid collection. Major dural sinuses are grossly patent. Pituitary gland mildly prominent without discrete lesion. Midline structures intact and normal. Vascular: Major intracranial vascular flow voids well maintained and normal in appearance. Skull and upper cervical spine: Craniocervical junction normal. Visualized upper cervical spine within normal limits. Bone marrow signal intensity normal. No acute scalp soft tissue abnormality. Small lipoma noted at the right frontal scalp. Sinuses/Orbits:  Globes and orbital soft tissues within normal limits. Mild scattered mucosal thickening throughout the paranasal sinuses. No air-fluid level to suggest acute sinusitis. No mastoid effusion. Inner ear structures normal. Other: None. IMPRESSION: Normal brain MRI. No acute intracranial infarct or other abnormality identified. Electronically Signed  By: Jeannine Boga M.D.   On: 11/18/2017 14:06   Ct Cerebral Perfusion W Contrast  Result Date: 11/18/2017 CLINICAL DATA:  52 year old female with symptoms initially of severe headache, subsequent mild aphasia. Hypertensive on presentation (systolic greater than 321). EXAM: CT ANGIOGRAPHY HEAD AND NECK CT PERFUSION BRAIN TECHNIQUE: Multidetector CT imaging of the head and neck was performed using the standard protocol during bolus administration of intravenous contrast. Multiplanar CT image reconstructions and MIPs were obtained to evaluate the vascular anatomy. Carotid stenosis measurements (when applicable) are obtained utilizing NASCET criteria, using the distal internal carotid diameter as the denominator. Multiphase CT imaging of the brain was performed following IV bolus contrast injection. Subsequent parametric perfusion maps were calculated using RAPID software. CONTRAST:  144mL ISOVUE-370 IOPAMIDOL (ISOVUE-370) INJECTION 76% COMPARISON:  Noncontrast head CT 0939 hours today. FINDINGS: CT Brain Perfusion Findings: CBF (<30%) Volume: zero Perfusion (Tmax>6.0s) volume: 7 milliliters; although limited to the left inferior frontal gyrus along the skull base, suspicious for artifact Mismatch Volume: 7 milliliters Infarction Location:Not applicable CTA NECK Skeleton: Intermittent carious dentition. No acute osseous abnormality identified. Upper chest: Mild biapical lung scarring with pleural thickening. No superior mediastinal lymphadenopathy. Other neck: Negative.  No neck mass or lymphadenopathy. Aortic arch: 4 vessel arch configuration, the left vertebral  artery arises directly from the arch. No arch atherosclerosis or great vessel origin stenosis. Right carotid system: Negative aside from tortuous right ICA just below the skull base with a kinked appearance. Mild associated vessel irregularity in that region, but no discrete atherosclerosis. Left carotid system: Negative aside from mild tortuosity and a subtle beaded appearance of the left ICA distal to the bulb, best demonstrated on series 5, images 86 and 87. No atherosclerosis or stenosis. Vertebral arteries: Normal proximal right subclavian artery and right vertebral artery origin. The right vertebral artery is dominant and tortuous, but patent to the skull base without stenosis. Non dominant left vertebral artery arises from the arch directly. It has a late entry into the cervical transverse foramen. Mild left vertebral artery tortuosity with no stenosis to the skull base. CTA HEAD Posterior circulation: Mildly dominant distal right vertebral artery. No distal vertebral stenosis. Patent right PICA origin. Tortuous vertebrobasilar junction without stenosis. Patent AICA origins, the left appears dominant. Tortuous basilar artery without stenosis. Fetal type bilateral PCA origins with a tortuous left P com and ectasia of the basilar artery tip. However, there is no discrete aneurysm. Both SCA origins are patent. Bilateral PCA branches are patent. There is mild distal left PCA branch irregularity. Anterior circulation: Both ICA siphons are patent without atherosclerosis or stenosis. Mild cavernous segment tortuosity. Normal ophthalmic and posterior communicating artery origins. Patent carotid termini. Tortuous left ICA terminus. Dominant left ACA, the right is diminutive, and this simulates azygos type ACA anatomy. The ACA branches are within normal limits. The left MCA M1 segment is tortuous without stenosis. Patent left MCA trifurcation with mild ectasia (series 7, image 111), but no discrete aneurysm. Left MCA  branches are within normal limits. The right MCA M1 segment, bifurcation, and right MCA branches are within normal limits. Venous sinuses: Patent. Anatomic variants: Dominant right vertebral artery, the left arises directly from the arch. Fetal type PCA origins. Dominant left ACA, simulating azygos ACA anatomy. Review of the MIP images confirms the above findings IMPRESSION: 1. Negative for large vessel occlusion, and no atherosclerosis or stenosis identified in the head or neck. 2. Suspect artifactual area of increased Tmax in the left inferior frontal gyrus. No core  infarct detected by CT Perfusion. 3. 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 4. Occasional intracranial artery ectasia, including at the left MCA trifurcation and at the basilar artery tip, but no discrete intracranial aneurysm. 5. Multiple incidental arterial anatomic variations. Preliminary report of #1 and #2 were discussed by telephone with Dr. Amie Portland on 11/18/2017 at 1007 hours. Final report salient findings were communicated at 10:24 amon via the [facility] messaging system. Electronically Signed   By: Genevie Ann M.D.   On: 11/18/2017 10:24   Ct Head Code Stroke Wo Contrast  Result Date: 11/18/2017 CLINICAL DATA:  Code stroke. 52 year old female who awoke with dizziness. Onset of aphasia at 0815 hours. EXAM: CT HEAD WITHOUT CONTRAST TECHNIQUE: Contiguous axial images were obtained from the base of the skull through the vertex without intravenous contrast. COMPARISON:  Head CT without contrast 05/10/2007. Cervical spine MRI 04/20/2017. FINDINGS: Brain: Cerebral volume has not significantly changed since 2009. No midline shift, ventriculomegaly, mass effect, evidence of mass lesion, intracranial hemorrhage or evidence of cortically based acute infarction. Gray-white matter differentiation is within normal limits throughout the brain. Vascular: Subtle asymmetric hyperdensity at the  left ICA terminus (series 5, image 30 and series 6, image 27). The left terminus is also somewhat tortuous compared to the right. No other No suspicious intracranial vascular hyperdensity. Skull: Stable and within normal limits. Sinuses/Orbits: Visualized paranasal sinuses and mastoids are stable and well pneumatized. Other: Visualized orbit soft tissues are within normal limits. Small right frontal scalp soft tissue benign lipoma incidentally re-demonstrated and stable. ASPECTS [facility] Stroke Program Early CT Score) - Ganglionic level infarction (caudate, lentiform nuclei, internal capsule, insula, M1-M3 cortex): 7 - Supraganglionic infarction (M4-M6 cortex): 3 Total score (0-10 with 10 being normal): 10 IMPRESSION: 1. No acute intracranial hemorrhage identified and ASPECTS is 10, however, there is subtle asymmetric hyperdensity at the left ICA terminus. 2. These results were communicated to Dr. Rory Percy at 9:43 amon 7/15/2019by text page via the [facility] messaging system. Electronically Signed   By: Genevie Ann M.D.   On: 11/18/2017 09:43       Laurey Morale, MSN, NP-C [facility] [phone]  11/18/2017, 10:45 AM   Attending physician note to follow with Assessment and plan .  Laurey Morale, MSN, NP-C [facility] [phone]   Attending addendum Patient seen and examined. Agree with the history and physical documented above. Imaging reviewed personally.  CT of the brain shows no acute changes. CT Angie head and neck shows no acute changes or evidence of LVO. Possible arterial tortuosity with mild bilateral cervical ICA FMD. MRI of the brain shows no stroke. Patient brought in initially as a code stroke.  Denied illicit drug use initially. Later on endorsed marijuana and cocaine abuse. Initial NIH-0 No TPA because of NIH 0.  No endovascular because of no LVO. Labs essentially unremarkable with the exception of urinary toxicology screen positive for  cocaine  Assessment 52 year old woman presenting with acute onset of headache, slurred speech and possible aphasia in the setting of uncontrolled blood pressure. Likely hypertensive urgency secondary to medication noncompliance and cocaine abuse. Imaging negative for stroke. Does not require admission for inpatient stroke work-up.  Recommendations -I have recommended an MRI of the brain to be done which was done and was negative for stroke. -No further inpatient neurological recommendations -Refrain from illicit drug use - Blood pressure management per primary team. Follow-up with primary care   CRITICAL CARE ATTESTATION This patient is critically  ill and at significant risk of neurological worsening, death and care requires constant monitoring of vital signs, hemodynamics,respiratory and cardiac monitoring. I spent 50  minutes of neurocritical care time performing neurological assessment, discussion with family, other specialists and medical decision making of high complexityin the care of  this patient.

## 2017-11-18 NOTE — Code Documentation (Addendum)
[name] coming from OB/GYN office where she was having an follow-up appt. due to fibroids. Pt was noted to have high blood pressure and the staff noted aphasia and dysarthria. Pt ran out of her BP medication and has not been taking it for one week. EMS was called and activated a Code Stroke. Stroke Team met patient upon arrival to the ED. Initial NIHSS 2 due to left arm weakness and left leg drift. 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 When questioned, pt reported that she went to bed at 2300 last night with no symptoms. When she woke up at 0500 this morning, she was dizzy while going to the bathroom. Before she went to the doctor, pt started to have a headache. Complains of left sided headache with neck pain. No visual changes noted. Pt has hx of drug use, smoking, and alcohol use. No tPA due to Outside Window. CT Negative and CTA/CTP showed no signs of LVO. Handoff given to [place], Therapist, sports.

## 2017-11-18 NOTE — Discharge Instructions (Addendum)
Take HCTZ as prescribed for hypertension.   Avoid using cocaine.   See your primary care doctor for follow up and recheck blood pressure in a week.   See neurologist for follow up   Return to ER if you have worse dizziness, trouble speaking, headaches, weakness.

## 2017-11-18 NOTE — ED Triage Notes (Signed)
Pt to ER by [facility] from OBGYN office where patient was at for an appointment. Pt reportedly developed sudden onset slurred speech at 8:15 this morning with significant hypertension. EMS was called and code stroke activated upon their arrival. On arrival, patient reports she went to sleep normal last night and woke up at 5 am with dizziness. On arrival to ER patient has weakness to left side. Pt is a/o x4 on arrival, BP 221/106, HR 52.

## 2017-11-18 NOTE — Progress Notes (Signed)
   GYNECOLOGY OFFICE VISIT NOTE  52 y.o. [id number] here today for discussion about persistent right adnexal mass.  Patient underwent total vaginal hysterectomy with right salpingectomy for fibroids and menorrhagia  on 12/12/2016 during which normal ovaries were visualized. 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  She was told to follow up in in the office for her postop check and to have a repeat scan in 3 months but she did not follow up.  During her annual exam this year in May 2019, her PCP noted she had not followed up for this adnexal mass and ordered a follow up scan. The ultrasound on 09/26/2017 showed that right adnexal mass was still present, now 5.7 cm in size (slightly increased in size), separate from normal right ovary. The impression was of a possible fibroid remnant again.  The PCP referred her back to GYN for further management.  On encounter today, patient reports having a very severe headache, difficulty with speech and tingling, weakness on left side of her body.  The slurred speech and neurological symptoms started while she was in our waiting room, around 8:15am. She is visibly twitching on the left side of her body and holding her head.  Reports history of HTN; has not taken her meds in over a week. BP 192/99 then 211/110.  Concerned about possible stroke in the setting of hypertensive emergency.  EMS was called to transfer patient to [facility] ER, Dr. Darl Householder at the ED was informed and accepts this transfer.  He activated Code Stroke.  EMS arrived and sent to [facility] ER for further management. Appreciate the help from EMS and Dr. Darl Householder in caring for this patient.  When patient is stabilized, we will re-schedule her appointment for this likely benign pelvic mass.    Total face-to-face time with patient: 20 minutes.  Over 50% of encounter was spent on counseling and coordination of care; with transfer to the ED for urgent management.    Verita Schneiders, MD, [place] for [employer], [place]

## 2017-11-18 NOTE — Progress Notes (Signed)
Patient reports being out of her BP medication for the past week- states she hasn't been able to reach her doctor because the office has been closed. Patient reports "banging headache" this morning.

## 2017-11-18 NOTE — Patient Instructions (Signed)
Return to clinic for any scheduled appointments or for any gynecologic concerns as needed.   

## 2017-11-18 NOTE — ED Provider Notes (Signed)
[facility] EMERGENCY DEPARTMENT Provider Note   CSN: [id number] Arrival date & time: 11/18/17  3818     History   Chief Complaint Chief Complaint  Patient presents with  . Code Stroke    HPI Jean [name] is a 52 y.o. female history of hypertension with medication noncompliance, uterine fibroids status post hysterectomy here presenting with headache, dizziness, hypertension.  She states that she ran out of her blood pressure medicines for the last several months.  She woke up this morning around 5:30 AM with some dizziness.  She states that she felt like she is on pass out.  Around 8:15 AM, she had acute onset of trouble speaking.  She had a follow-up at GYN clinic for her adnexal mass and she went there was noted to be very hypertensive around 220/110.  She also was noted to have some expressive aphasia so code stroke was activated by EMS.   The history is provided by the patient.    Past Medical History:  Diagnosis Date  . Arthritis    hip  . Asthma   . Carpal tunnel syndrome    bilateral  . Chronic lower back pain   . Depression   . Dyspnea    with exertion  . GERD (gastroesophageal reflux disease)   . Headache    migraines  . Herpes   . Hypertension   . SVD (spontaneous vaginal delivery)    x 2  . Uterine leiomyoma 02/16/2016    Patient Active Problem List   Diagnosis Date Noted  . Dyspareunia in female 09/13/2017  . Adnexal mass 07/10/2017  . Spinal stenosis of cervical region 07/01/2017  . Cervicalgia 04/24/2017  . CKD (chronic kidney disease) 10/29/2016  . Chest wall pain 10/15/2016  . Chronic pain syndrome 05/10/2016  . Liver lesion, right lobe 12/01/2015  . MDD (major depressive disorder) 10/19/2015  . Dyspnea on exertion 08/22/2015  . HTN (hypertension) 07/08/2015  . Routine health maintenance 07/08/2015  . Tobacco abuse 07/08/2015    Past Surgical History:  Procedure Laterality Date  . SALPINGECTOMY Left    lap - ectopic  preg  . TOTAL HIP ARTHROPLASTY Left 01/20/2016   Procedure: LEFT TOTAL HIP ARTHROPLASTY ANTERIOR APPROACH;  Surgeon: Mcarthur Rossetti, MD;  Location: [facility];  Service: Orthopedics;  Laterality: Left;  Marland Kitchen VAGINAL HYSTERECTOMY Right 12/11/2016   Procedure: HYSTERECTOMY VAGINAL WITH RIGHT SALPINGECTOMY;  Surgeon: Chancy Milroy, MD;  Location: [facility];  Service: Gynecology;  Laterality: Right;     OB History    Gravida  3   Para  1   Term  1   Preterm      AB  2   Living  1     SAB      TAB  1   Ectopic  1   Multiple      Live Births               Home Medications    Prior to Admission medications   Medication Sig Start Date End Date Taking? Authorizing Provider  amLODipine (NORVASC) 5 MG tablet Take 1 tablet (5 mg total) by mouth daily. 08/29/17 11/18/17 Yes Alphonzo Grieve, MD  ASPIRIN ADULT PO Take 1 tablet by mouth daily.   Yes [provider]  cyclobenzaprine (FLEXERIL) 10 MG tablet Take 1 tablet (10 mg total) by mouth 2 (two) times daily as needed for muscle spasms. 08/29/17  Yes Alphonzo Grieve, MD  gabapentin (NEURONTIN) 300 MG  capsule Take 1 capsule (300 mg total) by mouth at bedtime. 08/29/17  Yes Alphonzo Grieve, MD  hydrochlorothiazide (HYDRODIURIL) 25 MG tablet Take 1 tablet (25 mg total) by mouth daily. 04/24/17  Yes Thomasene Ripple, MD  ibuprofen (IBU) 600 MG tablet Take 1 tablet (600 mg total) by mouth every 6 (six) hours as needed. Patient taking differently: Take 600 mg by mouth every 6 (six) hours as needed for headache or mild pain.  10/14/17  Yes Alphonzo Grieve, MD  ranitidine (ZANTAC) 150 MG tablet Take 1 tablet (150 mg total) by mouth 2 (two) times daily. 08/29/17  Yes Alphonzo Grieve, MD  sertraline (ZOLOFT) 50 MG tablet Take 2 tablets (100 mg total) by mouth daily. 04/24/17 04/24/18 Yes NedrudLarena Glassman, MD  tetrahydrozoline 0.05 % ophthalmic solution Place 1 drop into both eyes daily as needed (dry eyes).   Yes [provider]    albuterol (PROVENTIL HFA;VENTOLIN HFA) 108 (90 Base) MCG/ACT inhaler Inhale 2 puffs into the lungs every 6 (six) hours as needed for wheezing or shortness of breath. Patient not taking: Reported on 11/18/2017 10/14/17   Alphonzo Grieve, MD    Family History Family History  Problem Relation Age of Onset  . Cancer Mother   . Hypertension Mother   . Diabetes Mother   . Stroke Father     Social History Social History   Tobacco Use  . Smoking status: Light Tobacco Smoker    Packs/day: 0.25    Years: 11.00    Pack years: 2.75    Types: Cigarettes    Last attempt to quit: 11/28/2016    Years since quitting: 0.9  . Smokeless tobacco: Never Used  Substance Use Topics  . Alcohol use: Yes    Alcohol/week: 1.2 oz    Types: 2 Cans of beer per week  . Drug use: No    Types: "Crack" cocaine, Cocaine     Allergies   Lisinopril   Review of Systems Review of Systems  Neurological: Positive for speech difficulty and headaches.  All other systems reviewed and are negative.    Physical Exam Updated Vital Signs BP (!) 162/85   Pulse (!) 46   Temp 98.3 F (36.8 C) (Oral)   Resp 11   LMP 12/08/2016 (Approximate)   SpO2 99%   Physical Exam  Constitutional:  Uncomfortable   HENT:  Head: Normocephalic.  Mouth/Throat: Oropharynx is clear and moist.  Eyes: Pupils are equal, round, and reactive to light. Conjunctivae and EOM are normal.  Neck: Normal range of motion. Neck supple.  Cardiovascular: Normal rate, regular rhythm and normal heart sounds.  Pulmonary/Chest: Effort normal and breath sounds normal. No stridor. No respiratory distress. She has no wheezes.  Abdominal: Soft. Bowel sounds are normal. She exhibits no distension. There is no tenderness. There is no guarding.  Musculoskeletal: Normal range of motion.  Neurological:  Some expressive aphasia. No obvious facial droop. Some L pronator drift and L dysmetria. Strength 4/5 L arm, 5/5 other extremities   Skin: Skin is  warm. Capillary refill takes less than 2 seconds.  Psychiatric: She has a normal mood and affect.  Nursing note and vitals reviewed.    ED Treatments / Results  Labs (all labs ordered are listed, but only abnormal results are displayed) Labs Reviewed  CBC - Abnormal; Notable for the following components:      Result Value   RBC 3.79 (*)    MCV 102.4 (*)    All other components within normal limits  COMPREHENSIVE METABOLIC PANEL - Abnormal; Notable for the following components:   BUN 22 (*)    Creatinine, Ser 1.45 (*)    AST 14 (*)    GFR calc non Af Amer 40 (*)    GFR calc Af Amer 46 (*)    All other components within normal limits  I-STAT CHEM 8, ED - Abnormal; Notable for the following components:   BUN 26 (*)    Creatinine, Ser 1.40 (*)    All other components within normal limits  ETHANOL  PROTIME-INR  APTT  DIFFERENTIAL  RAPID URINE DRUG SCREEN, HOSP PERFORMED  URINALYSIS, ROUTINE W REFLEX MICROSCOPIC  CBG MONITORING, ED  I-STAT TROPONIN, ED  I-STAT BETA HCG BLOOD, ED ([facility], [facility], [facility] ONLY)    EKG EKG Interpretation  Date/Time:  Monday November 18 2017 10:01:33 EDT Ventricular Rate:  73 PR Interval:    QRS Duration: 101 QT Interval:  417 QTC Calculation: 460 R Axis:   -4 Text Interpretation:  Sinus rhythm Biatrial enlargement Probable anteroseptal infarct, old No significant change since last tracing Confirmed by Wandra Arthurs [phone]) on 11/18/2017 10:04:44 AM   Radiology Ct Angio Head W Or Wo Contrast  Result Date: 11/18/2017 CLINICAL DATA:  53 year old female with symptoms initially of severe headache, subsequent mild aphasia. Hypertensive on presentation (systolic greater than 712). EXAM: CT ANGIOGRAPHY HEAD AND NECK CT PERFUSION BRAIN TECHNIQUE: Multidetector CT imaging of the head and neck was performed using the standard protocol during bolus administration of intravenous contrast. Multiplanar CT image reconstructions and MIPs were obtained to evaluate the  vascular anatomy. Carotid stenosis measurements (when applicable) are obtained utilizing NASCET criteria, using the distal internal carotid diameter as the denominator. Multiphase CT imaging of the brain was performed following IV bolus contrast injection. Subsequent parametric perfusion maps were calculated using RAPID software. CONTRAST:  159mL ISOVUE-370 IOPAMIDOL (ISOVUE-370) INJECTION 76% COMPARISON:  Noncontrast head CT 0939 hours today. FINDINGS: CT Brain Perfusion Findings: CBF (<30%) Volume: zero Perfusion (Tmax>6.0s) volume: 7 milliliters; although limited to the left inferior frontal gyrus along the skull base, suspicious for artifact Mismatch Volume: 7 milliliters Infarction Location:Not applicable CTA NECK Skeleton: Intermittent carious dentition. No acute osseous abnormality identified. Upper chest: Mild biapical lung scarring with pleural thickening. No superior mediastinal lymphadenopathy. Other neck: Negative.  No neck mass or lymphadenopathy. Aortic arch: 4 vessel arch configuration, the left vertebral artery arises directly from the arch. No arch atherosclerosis or great vessel origin stenosis. Right carotid system: Negative aside from tortuous right ICA just below the skull base with a kinked appearance. Mild associated vessel irregularity in that region, but no discrete atherosclerosis. Left carotid system: Negative aside from mild tortuosity and a subtle beaded appearance of the left ICA distal to the bulb, best demonstrated on series 5, images 86 and 87. No atherosclerosis or stenosis. Vertebral arteries: Normal proximal right subclavian artery and right vertebral artery origin. The right vertebral artery is dominant and tortuous, but patent to the skull base without stenosis. Non dominant left vertebral artery arises from the arch directly. It has a late entry into the cervical transverse foramen. Mild left vertebral artery tortuosity with no stenosis to the skull base. CTA HEAD Posterior  circulation: Mildly dominant distal right vertebral artery. No distal vertebral stenosis. Patent right PICA origin. Tortuous vertebrobasilar junction without stenosis. Patent AICA origins, the left appears dominant. Tortuous basilar artery without stenosis. Fetal type bilateral PCA origins with a tortuous left P com and ectasia of the basilar artery tip. However,  there is no discrete aneurysm. Both SCA origins are patent. Bilateral PCA branches are patent. There is mild distal left PCA branch irregularity. Anterior circulation: Both ICA siphons are patent without atherosclerosis or stenosis. Mild cavernous segment tortuosity. Normal ophthalmic and posterior communicating artery origins. Patent carotid termini. Tortuous left ICA terminus. Dominant left ACA, the right is diminutive, and this simulates azygos type ACA anatomy. The ACA branches are within normal limits. The left MCA M1 segment is tortuous without stenosis. Patent left MCA trifurcation with mild ectasia (series 7, image 111), but no discrete aneurysm. Left MCA branches are within normal limits. The right MCA M1 segment, bifurcation, and right MCA branches are within normal limits. Venous sinuses: Patent. Anatomic variants: Dominant right vertebral artery, the left arises directly from the arch. Fetal type PCA origins. Dominant left ACA, simulating azygos ACA anatomy. Review of the MIP images confirms the above findings IMPRESSION: 1. Negative for large vessel occlusion, and no atherosclerosis or stenosis identified in the head or neck. 2. Suspect artifactual area of increased Tmax in the left inferior frontal gyrus. No core infarct detected by CT Perfusion. 3. 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 4. Occasional intracranial artery ectasia, including at the left MCA trifurcation and at the basilar artery tip, but no discrete intracranial aneurysm. 5. Multiple incidental arterial anatomic  variations. Preliminary report of #1 and #2 were discussed by telephone with Dr. Amie Portland on 11/18/2017 at 1007 hours. Final report salient findings were communicated at 10:24 amon via the [facility] messaging system. Electronically Signed   By: Genevie Ann M.D.   On: 11/18/2017 10:24   Ct Angio Neck W And/or Wo Contrast  Result Date: 11/18/2017 CLINICAL DATA:  52 year old female with symptoms initially of severe headache, subsequent mild aphasia. Hypertensive on presentation (systolic greater than 672). EXAM: CT ANGIOGRAPHY HEAD AND NECK CT PERFUSION BRAIN TECHNIQUE: Multidetector CT imaging of the head and neck was performed using the standard protocol during bolus administration of intravenous contrast. Multiplanar CT image reconstructions and MIPs were obtained to evaluate the vascular anatomy. Carotid stenosis measurements (when applicable) are obtained utilizing NASCET criteria, using the distal internal carotid diameter as the denominator. Multiphase CT imaging of the brain was performed following IV bolus contrast injection. Subsequent parametric perfusion maps were calculated using RAPID software. CONTRAST:  176mL ISOVUE-370 IOPAMIDOL (ISOVUE-370) INJECTION 76% COMPARISON:  Noncontrast head CT 0939 hours today. FINDINGS: CT Brain Perfusion Findings: CBF (<30%) Volume: zero Perfusion (Tmax>6.0s) volume: 7 milliliters; although limited to the left inferior frontal gyrus along the skull base, suspicious for artifact Mismatch Volume: 7 milliliters Infarction Location:Not applicable CTA NECK Skeleton: Intermittent carious dentition. No acute osseous abnormality identified. Upper chest: Mild biapical lung scarring with pleural thickening. No superior mediastinal lymphadenopathy. Other neck: Negative.  No neck mass or lymphadenopathy. Aortic arch: 4 vessel arch configuration, the left vertebral artery arises directly from the arch. No arch atherosclerosis or great vessel origin stenosis. Right carotid system:  Negative aside from tortuous right ICA just below the skull base with a kinked appearance. Mild associated vessel irregularity in that region, but no discrete atherosclerosis. Left carotid system: Negative aside from mild tortuosity and a subtle beaded appearance of the left ICA distal to the bulb, best demonstrated on series 5, images 86 and 87. No atherosclerosis or stenosis. Vertebral arteries: Normal proximal right subclavian artery and right vertebral artery origin. The right vertebral artery is dominant and tortuous, but patent to the skull base without stenosis.  Non dominant left vertebral artery arises from the arch directly. It has a late entry into the cervical transverse foramen. Mild left vertebral artery tortuosity with no stenosis to the skull base. CTA HEAD Posterior circulation: Mildly dominant distal right vertebral artery. No distal vertebral stenosis. Patent right PICA origin. Tortuous vertebrobasilar junction without stenosis. Patent AICA origins, the left appears dominant. Tortuous basilar artery without stenosis. Fetal type bilateral PCA origins with a tortuous left P com and ectasia of the basilar artery tip. However, there is no discrete aneurysm. Both SCA origins are patent. Bilateral PCA branches are patent. There is mild distal left PCA branch irregularity. Anterior circulation: Both ICA siphons are patent without atherosclerosis or stenosis. Mild cavernous segment tortuosity. Normal ophthalmic and posterior communicating artery origins. Patent carotid termini. Tortuous left ICA terminus. Dominant left ACA, the right is diminutive, and this simulates azygos type ACA anatomy. The ACA branches are within normal limits. The left MCA M1 segment is tortuous without stenosis. Patent left MCA trifurcation with mild ectasia (series 7, image 111), but no discrete aneurysm. Left MCA branches are within normal limits. The right MCA M1 segment, bifurcation, and right MCA branches are within normal  limits. Venous sinuses: Patent. Anatomic variants: Dominant right vertebral artery, the left arises directly from the arch. Fetal type PCA origins. Dominant left ACA, simulating azygos ACA anatomy. Review of the MIP images confirms the above findings IMPRESSION: 1. Negative for large vessel occlusion, and no atherosclerosis or stenosis identified in the head or neck. 2. Suspect artifactual area of increased Tmax in the left inferior frontal gyrus. No core infarct detected by CT Perfusion. 3. 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 4. Occasional intracranial artery ectasia, including at the left MCA trifurcation and at the basilar artery tip, but no discrete intracranial aneurysm. 5. Multiple incidental arterial anatomic variations. Preliminary report of #1 and #2 were discussed by telephone with Dr. Amie Portland on 11/18/2017 at 1007 hours. Final report salient findings were communicated at 10:24 amon via the [facility] messaging system. Electronically Signed   By: Genevie Ann M.D.   On: 11/18/2017 10:24   Ct Cerebral Perfusion W Contrast  Result Date: 11/18/2017 CLINICAL DATA:  52 year old female with symptoms initially of severe headache, subsequent mild aphasia. Hypertensive on presentation (systolic greater than 700). EXAM: CT ANGIOGRAPHY HEAD AND NECK CT PERFUSION BRAIN TECHNIQUE: Multidetector CT imaging of the head and neck was performed using the standard protocol during bolus administration of intravenous contrast. Multiplanar CT image reconstructions and MIPs were obtained to evaluate the vascular anatomy. Carotid stenosis measurements (when applicable) are obtained utilizing NASCET criteria, using the distal internal carotid diameter as the denominator. Multiphase CT imaging of the brain was performed following IV bolus contrast injection. Subsequent parametric perfusion maps were calculated using RAPID software. CONTRAST:  146mL ISOVUE-370  IOPAMIDOL (ISOVUE-370) INJECTION 76% COMPARISON:  Noncontrast head CT 0939 hours today. FINDINGS: CT Brain Perfusion Findings: CBF (<30%) Volume: zero Perfusion (Tmax>6.0s) volume: 7 milliliters; although limited to the left inferior frontal gyrus along the skull base, suspicious for artifact Mismatch Volume: 7 milliliters Infarction Location:Not applicable CTA NECK Skeleton: Intermittent carious dentition. No acute osseous abnormality identified. Upper chest: Mild biapical lung scarring with pleural thickening. No superior mediastinal lymphadenopathy. Other neck: Negative.  No neck mass or lymphadenopathy. Aortic arch: 4 vessel arch configuration, the left vertebral artery arises directly from the arch. No arch atherosclerosis or great vessel origin stenosis. Right carotid system: Negative aside from tortuous  right ICA just below the skull base with a kinked appearance. Mild associated vessel irregularity in that region, but no discrete atherosclerosis. Left carotid system: Negative aside from mild tortuosity and a subtle beaded appearance of the left ICA distal to the bulb, best demonstrated on series 5, images 86 and 87. No atherosclerosis or stenosis. Vertebral arteries: Normal proximal right subclavian artery and right vertebral artery origin. The right vertebral artery is dominant and tortuous, but patent to the skull base without stenosis. Non dominant left vertebral artery arises from the arch directly. It has a late entry into the cervical transverse foramen. Mild left vertebral artery tortuosity with no stenosis to the skull base. CTA HEAD Posterior circulation: Mildly dominant distal right vertebral artery. No distal vertebral stenosis. Patent right PICA origin. Tortuous vertebrobasilar junction without stenosis. Patent AICA origins, the left appears dominant. Tortuous basilar artery without stenosis. Fetal type bilateral PCA origins with a tortuous left P com and ectasia of the basilar artery tip.  However, there is no discrete aneurysm. Both SCA origins are patent. Bilateral PCA branches are patent. There is mild distal left PCA branch irregularity. Anterior circulation: Both ICA siphons are patent without atherosclerosis or stenosis. Mild cavernous segment tortuosity. Normal ophthalmic and posterior communicating artery origins. Patent carotid termini. Tortuous left ICA terminus. Dominant left ACA, the right is diminutive, and this simulates azygos type ACA anatomy. The ACA branches are within normal limits. The left MCA M1 segment is tortuous without stenosis. Patent left MCA trifurcation with mild ectasia (series 7, image 111), but no discrete aneurysm. Left MCA branches are within normal limits. The right MCA M1 segment, bifurcation, and right MCA branches are within normal limits. Venous sinuses: Patent. Anatomic variants: Dominant right vertebral artery, the left arises directly from the arch. Fetal type PCA origins. Dominant left ACA, simulating azygos ACA anatomy. Review of the MIP images confirms the above findings IMPRESSION: 1. Negative for large vessel occlusion, and no atherosclerosis or stenosis identified in the head or neck. 2. Suspect artifactual area of increased Tmax in the left inferior frontal gyrus. No core infarct detected by CT Perfusion. 3. 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 4. Occasional intracranial artery ectasia, including at the left MCA trifurcation and at the basilar artery tip, but no discrete intracranial aneurysm. 5. Multiple incidental arterial anatomic variations. Preliminary report of #1 and #2 were discussed by telephone with Dr. Amie Portland on 11/18/2017 at 1007 hours. Final report salient findings were communicated at 10:24 amon via the [facility] messaging system. Electronically Signed   By: Genevie Ann M.D.   On: 11/18/2017 10:24   Ct Head Code Stroke Wo Contrast  Result Date: 11/18/2017 CLINICAL  DATA:  Code stroke. 52 year old female who awoke with dizziness. Onset of aphasia at 0815 hours. EXAM: CT HEAD WITHOUT CONTRAST TECHNIQUE: Contiguous axial images were obtained from the base of the skull through the vertex without intravenous contrast. COMPARISON:  Head CT without contrast 05/10/2007. Cervical spine MRI 04/20/2017. FINDINGS: Brain: Cerebral volume has not significantly changed since 2009. No midline shift, ventriculomegaly, mass effect, evidence of mass lesion, intracranial hemorrhage or evidence of cortically based acute infarction. Gray-white matter differentiation is within normal limits throughout the brain. Vascular: Subtle asymmetric hyperdensity at the left ICA terminus (series 5, image 30 and series 6, image 27). The left terminus is also somewhat tortuous compared to the right. No other No suspicious intracranial vascular hyperdensity. Skull: Stable and within normal limits. Sinuses/Orbits: Visualized paranasal  sinuses and mastoids are stable and well pneumatized. Other: Visualized orbit soft tissues are within normal limits. Small right frontal scalp soft tissue benign lipoma incidentally re-demonstrated and stable. ASPECTS [facility] Stroke Program Early CT Score) - Ganglionic level infarction (caudate, lentiform nuclei, internal capsule, insula, M1-M3 cortex): 7 - Supraganglionic infarction (M4-M6 cortex): 3 Total score (0-10 with 10 being normal): 10 IMPRESSION: 1. No acute intracranial hemorrhage identified and ASPECTS is 10, however, there is subtle asymmetric hyperdensity at the left ICA terminus. 2. These results were communicated to Dr. Rory Percy at 9:43 amon 7/15/2019by text page via the [facility] messaging system. Electronically Signed   By: Genevie Ann M.D.   On: 11/18/2017 09:43    Procedures Procedures (including critical care time)  CRITICAL CARE Performed by: Wandra Arthurs   Total critical care time: 30 minutes  Critical care time was exclusive of separately billable procedures  and treating other patients.  Critical care was necessary to treat or prevent imminent or life-threatening deterioration.  Critical care was time spent personally by me on the following activities: development of treatment plan with patient and/or surrogate as well as nursing, discussions with consultants, evaluation of patient's response to treatment, examination of patient, obtaining history from patient or surrogate, ordering and performing treatments and interventions, ordering and review of laboratory studies, ordering and review of radiographic studies, pulse oximetry and re-evaluation of patient's condition.   Medications Ordered in ED Medications  clevidipine (CLEVIPREX) infusion 0.5 mg/mL (has no administration in time range)  iopamidol (ISOVUE-370) 76 % injection 100 mL (100 mLs Intravenous Contrast Given 11/18/17 1001)     Initial Impression / Assessment and Plan / ED Course  I have reviewed the triage vital signs and the nursing notes.  Pertinent labs & imaging results that were available during my care of the patient were reviewed by me and considered in my medical decision making (see chart for details).     GREGG HOLSTER is a 52 y.o. female here with dizziness, hypertension, some expressive aphasia, L sided weakness. Code stroke activated by EMS. I am concerned for possible SAH vs central vertigo vs complex migraine vs PRES. Dr. Malen Gauze from neurology at bedside. Will get stroke labs, CT head.   11 AM CTA showed no LVO. Dr. Malen Gauze recommend treating for hypertensive urgency with BP reduction around 20%. Recommend MRI brain as well to r/o PRES.   2:52 PM I ordered cleviprex initially but patient's BP came down to 150s from 220s with no interventions. MRI brain showed no stroke or bleed. UDs + cocaine. I discussed with Dr. Malen Gauze, neurologist who saw patient. He thinks likely hypertensive urgency. Since her symptoms resolved, recommend restarting home BP meds (HCTZ), and  avoid using cocaine. Stable for discharge with PCP, neurology follow up    Final Clinical Impressions(s) / ED Diagnoses   Final diagnoses:  None    ED Discharge Orders    None       Drenda Freeze, MD 11/18/17 1453

## 2017-11-18 NOTE — ED Notes (Signed)
Pt transported to MRI 

## 2017-11-20 ENCOUNTER — Other Ambulatory Visit: Payer: Self-pay

## 2017-11-20 ENCOUNTER — Encounter: Payer: Self-pay | Admitting: Neurology

## 2017-11-20 ENCOUNTER — Ambulatory Visit (INDEPENDENT_AMBULATORY_CARE_PROVIDER_SITE_OTHER): Payer: Self-pay | Admitting: Neurology

## 2017-11-20 VITALS — BP 121/80 | HR 58 | Ht 63.0 in | Wt 144.2 lb

## 2017-11-20 DIAGNOSIS — G441 Vascular headache, not elsewhere classified: Secondary | ICD-10-CM

## 2017-11-20 MED ORDER — KETOROLAC TROMETHAMINE 30 MG/ML IJ SOLN: 30.0000 mg | Freq: Once | INTRAMUSCULAR | Status: AC

## 2017-11-20 MED ORDER — DIVALPROEX SODIUM ER 500 MG PO TB24: 500.0000 mg | ORAL_TABLET | Freq: Every day | ORAL | 2 refills | Status: DC

## 2017-11-20 NOTE — Progress Notes (Signed)
[facility] [address] [place]. [place] [address] [phone]       OFFICE CONSULT NOTE  [name] Date of Birth:  01/29/1966 Medical Record Number:  [id number]   Referring MD:  Brantley Persons  Reason for Referral:  Headache and speech difficulties  HPI: Jean [name] is a 52 year old African-American lady who is seen today for neurological consultation following recent ER visit on 11/18/17 for headache, dizziness and some speech difficulties in the setting of significantly elevated blood pressure. History is obtained from the patient and review of electronic medical records. I personally reviewed brain imaging findings. Patient said that she ran out of her blood pressure medications for several months. She woke up on 11/18/17 complaining of dizziness and felt like she was about to pass out. She went to her OB/GYN doctor's appointment and was noted to be hypertensive with blood pressure 220/110. She also had some trouble completing sentences and speaking. She was sent to the ER for evaluation. She had also some subjective left leg and hand weakness. 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 She was seen on consultation by Dr. Malen Gauze from neurology and CT angiogram of the brain and neck which I personally reviewed showed no significant large vessel stenosis or occlusion. MRI scan of the brain was also obtained which I have reviewed shows no acute abnormality. Patient was counseled to be compliant with blood pressure medications. Patient also complains of headache which she feels has persisted for the last 3 days. Her speech difficulties have resolved. She complains of severe bifrontal headaches more around the left eye which is constant. Is accompanied by some light sensitivity no nausea or sound sensitivity. She has tried taking hydrocodone and ibuprofen without much relief.  She does have a prior history of migraine headaches usually they respond to ibuprofen. Her urine drug screen was positive for cocaine during her recent ER visit and patient states that she promises she will be clean and not abuse anymore. She denies any prior history of strokes TIAs seizures or significant neurological problems.  ROS:   14 system review of systems is positive for  hip pain, gait difficulty, headache, confusion, weakness, slurred speech, difficulty swallowing, and dizziness, anxiety, depression, disinterest in activities, aching muscles, feeling hot and cold, shortness of breath, cough, wheezing, blurred vision, eye pain, weight gain, ringing in the ears, trouble swallowing and all other systems negative  PMH:  Past Medical History:  Diagnosis Date  . Arthritis    hip  . Asthma   . Carpal tunnel syndrome    bilateral  . Chronic lower back pain   . Depression   . Dyspnea    with exertion  . GERD (gastroesophageal reflux disease)   . Headache    migraines  . Herpes   . Hypertension   . SVD (spontaneous vaginal delivery)    x 2  . Uterine leiomyoma 02/16/2016    Social History:  Social History   Socioeconomic History  . Marital status: Single    Spouse name: Not on file  . Number of children: Not on file  . Years of education: Not on file  . Highest education level: Not on file  Occupational History  . Not on file  Social Needs  . Financial resource strain: Not on file  . Food insecurity:    Worry: Not on file  Inability: Not on file  . Transportation needs:    Medical: Not on file    Non-medical: Not on file  Tobacco Use  . Smoking status: Light Tobacco Smoker    Packs/day: 0.25    Years: 11.00    Pack years: 2.75    Types: Cigarettes    Last attempt to quit: 11/28/2016    Years since quitting: 0.9  . Smokeless tobacco: Never Used  Substance and Sexual Activity  . Alcohol use: Not Currently    Alcohol/week: 1.2 oz    Types: 2 Cans of beer per  week    Comment: stopped a week ago (11/20/17) EK,RN  . Drug use: No    Types: "Crack" cocaine, Cocaine  . Sexual activity: Yes    Partners: Male    Birth control/protection: None  Lifestyle  . Physical activity:    Days per week: Not on file    Minutes per session: Not on file  . Stress: Not on file  Relationships  . Social connections:    Talks on phone: Not on file    Gets together: Not on file    Attends religious service: Not on file    Active member of club or organization: Not on file    Attends meetings of clubs or organizations: Not on file    Relationship status: Not on file  . Intimate partner violence:    Fear of current or ex partner: Not on file    Emotionally abused: Not on file    Physically abused: Not on file    Forced sexual activity: Not on file  Other Topics Concern  . Not on file  Social History Narrative   Lives with Jean [name]   Caffeine use: soda sometimes.   Right handed     Medications:   Current Outpatient Medications on File Prior to Visit  Medication Sig Dispense Refill  . albuterol (PROVENTIL HFA;VENTOLIN HFA) 108 (90 Base) MCG/ACT inhaler Inhale 2 puffs into the lungs every 6 (six) hours as needed for wheezing or shortness of breath. 18 g 0  . ASPIRIN ADULT PO Take 1 tablet by mouth daily.    . cyclobenzaprine (FLEXERIL) 10 MG tablet Take 1 tablet (10 mg total) by mouth 2 (two) times daily as needed for muscle spasms. 30 tablet 3  . gabapentin (NEURONTIN) 300 MG capsule Take 1 capsule (300 mg total) by mouth at bedtime. 30 capsule 3  . hydrochlorothiazide (HYDRODIURIL) 25 MG tablet Take 1 tablet (25 mg total) by mouth daily. 30 tablet 0  . ibuprofen (IBU) 600 MG tablet Take 1 tablet (600 mg total) by mouth every 6 (six) hours as needed. (Patient taking differently: Take 600 mg by mouth every 6 (six) hours as needed for headache or mild pain. ) 30 tablet 0  . ranitidine (ZANTAC) 150 MG tablet Take 1 tablet (150 mg total) by mouth 2 (two)  times daily. 180 tablet 1  . sertraline (ZOLOFT) 50 MG tablet Take 2 tablets (100 mg total) by mouth daily. 60 tablet 11  . tetrahydrozoline 0.05 % ophthalmic solution Place 1 drop into both eyes daily as needed (dry eyes).    Marland Kitchen amLODipine (NORVASC) 5 MG tablet Take 1 tablet (5 mg total) by mouth daily. 30 tablet 5   No current facility-administered medications on file prior to visit.     Allergies:   Allergies  Allergen Reactions  . Lisinopril Swelling    Lip swelling, patient reported - unable to confirm from chart  Physical Exam General: Frail middle-aged African-American lady, seated, in no evident distress Head: head normocephalic and atraumatic.   Neck: supple with no carotid or supraclavicular bruits Cardiovascular: regular rate and rhythm, no murmurs Musculoskeletal: no deformity Skin:  no rash/petichiae Vascular:  Normal pulses all extremities  Neurologic Exam Mental Status: Awake and fully alert. Oriented to place and time. Recent and remote memory intact. Attention span, concentration and fund of knowledge appropriate. Mood and affect appropriate.  Cranial Nerves: Fundoscopic exam reveals sharp disc margins. Pupils equal, briskly reactive to light. Extraocular movements full without nystagmus. Visual fields full to confrontation. Hearing intact. Facial sensation intact. Face, tongue, palate moves normally and symmetrically.  Motor: Normal bulk and tone. Normal strength in all tested extremity muscles. Mild weakness of left hip with giveaway due to pain Sensory.: intact to touch , pinprick , position and vibratory sensation.  Coordination: Rapid alternating movements normal in all extremities. Finger-to-nose and heel-to-shin performed accurately bilaterally. Gait and Station: Arises from chair without difficulty. Stance is slightly broad-based. Uses a wheeled walker and favors her left hip Reflexes: 1+ and symmetric. Toes downgoing.       ASSESSMENT: 45 year lady  with refractory headache following hypertensive urgency related to medication noncompliance and substance abuse. She has tried history of migraines. 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 We will treat her with Toradol 30 mg IM 1 in the office today. I recommend she discontinue over-the-counter anagesics like ibuprofen and hydrocodone to avoid rebound headaches. Start Depakote ER 500 mg daily. I also encouraged her to be compliant with her blood pressure medications and to quit cocaine and drugs. She was advised to call the office if her headache did not improve for further advice. She will return for follow-up in a month with minus practitioner Megan or call earlier if necessary. Greater than 50% time during this 45 minute consultation visit was spent on counseling and coordination of care about her refractory headache and hypertensive urgency and answering questions  Antony Contras, MD  [facility] [address] [place], [place] [address]  Phone [phone] Fax [phone] Note: This document was prepared with digital dictation and possible smart phrase technology. Any transcriptional errors that result from this process are unintentional.

## 2017-11-20 NOTE — Patient Instructions (Addendum)
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 We will treat her with Toradol 30 mg IM 1 in the office today. I recommend she discontinue over-the-counter anagesics like ibuprofen and hydrocodone to avoid rebound headaches. Start Depakote ER 500 mg daily. I also encouraged her to be compliant with her blood pressure medications and to quit cocaine and drugs. She will return for follow-up in a month with minus practitioner Megan or call earlier if necessary.

## 2017-11-21 ENCOUNTER — Emergency Department (HOSPITAL_COMMUNITY)
Admission: EM | Admit: 2017-11-21 | Discharge: 2017-11-21 | Disposition: A | Discharge status: Home / Self Care | Payer: Self-pay | Attending: Emergency Medicine | Admitting: Emergency Medicine

## 2017-11-21 ENCOUNTER — Ambulatory Visit: Payer: Self-pay | Admitting: Pharmacist

## 2017-11-21 ENCOUNTER — Encounter (HOSPITAL_COMMUNITY): Payer: Self-pay | Admitting: *Deleted

## 2017-11-21 ENCOUNTER — Other Ambulatory Visit: Payer: Self-pay

## 2017-11-21 DIAGNOSIS — J45909 Unspecified asthma, uncomplicated: Secondary | ICD-10-CM | POA: Insufficient documentation

## 2017-11-21 DIAGNOSIS — I10 Essential (primary) hypertension: Secondary | ICD-10-CM

## 2017-11-21 DIAGNOSIS — F17210 Nicotine dependence, cigarettes, uncomplicated: Secondary | ICD-10-CM | POA: Insufficient documentation

## 2017-11-21 DIAGNOSIS — Z7982 Long term (current) use of aspirin: Secondary | ICD-10-CM | POA: Insufficient documentation

## 2017-11-21 DIAGNOSIS — Z79899 Other long term (current) drug therapy: Secondary | ICD-10-CM | POA: Insufficient documentation

## 2017-11-21 MED ORDER — IBUPROFEN 800 MG PO TABS
800.0000 mg | ORAL_TABLET | Freq: Once | ORAL | Status: AC
  Administered 2017-11-21: 800 mg via ORAL
  Filled 2017-11-21: qty 1

## 2017-11-21 MED ORDER — AMLODIPINE BESYLATE 5 MG PO TABS: 5.0000 mg | ORAL_TABLET | Freq: Every day | ORAL | 0 refills | Status: DC

## 2017-11-21 MED ORDER — HYDROCHLOROTHIAZIDE 25 MG PO TABS: 25.0000 mg | ORAL_TABLET | Freq: Every day | ORAL | 0 refills | Status: DC

## 2017-11-21 MED FILL — AMLODIPINE BESYLATE 5 MG TA: 5 | 30 days supply | Qty: 30 | Fill #0

## 2017-11-21 MED FILL — HYDROCHLOROTHIAZIDE 25 MG T: 25 | 30 days supply | Qty: 30 | Fill #0

## 2017-11-21 NOTE — Progress Notes (Addendum)
S: Jean [name] is a 52 y.o. female reports to clinical pharmacist appointment for help with medications.   Allergies  Allergen Reactions  . Lisinopril Swelling    Lip swelling, patient reported - unable to confirm from chart    Medication Sig  albuterol (PROVENTIL HFA;VENTOLIN HFA) 108 (90 Base) MCG/ACT inhaler Inhale 2 puffs into the lungs every 6 (six) hours as needed for wheezing or shortness of breath.  amLODipine (NORVASC) 5 MG tablet Take 1 tablet (5 mg total) by mouth daily.  ASPIRIN ADULT PO Take 1 tablet by mouth daily.  cyclobenzaprine (FLEXERIL) 10 MG tablet Take 1 tablet (10 mg total) by mouth 2 (two) times daily as needed for muscle spasms.  divalproex (DEPAKOTE ER) 500 MG 24 hr tablet Take 1 tablet (500 mg total) by mouth daily.  gabapentin (NEURONTIN) 300 MG capsule Take 1 capsule (300 mg total) by mouth at bedtime.  hydrochlorothiazide (HYDRODIURIL) 25 MG tablet Take 1 tablet (25 mg total) by mouth daily.  ibuprofen (IBU) 600 MG tablet Take 1 tablet (600 mg total) by mouth every 6 (six) hours as needed. Patient taking differently: Take 600 mg by mouth every 6 (six) hours as needed for headache or mild pain.   ranitidine (ZANTAC) 150 MG tablet Take 1 tablet (150 mg total) by mouth 2 (two) times daily.  sertraline (ZOLOFT) 50 MG tablet Take 2 tablets (100 mg total) by mouth daily.  tetrahydrozoline 0.05 % ophthalmic solution Place 1 drop into both eyes daily as needed (dry eyes).   Past Medical History:  Diagnosis Date  . Arthritis    hip  . Asthma   . Carpal tunnel syndrome    bilateral  . Chronic lower back pain   . Depression   . Dyspnea    with exertion  . GERD (gastroesophageal reflux disease)   . Headache    migraines  . Herpes   . Hypertension   . SVD (spontaneous vaginal delivery)    x 2  . Uterine leiomyoma 02/16/2016   Social History   Socioeconomic History  . Marital status: Single    Spouse name: Not on file  . Number of children: Not on  file  . Years of education: Not on file  . Highest education level: Not on file  Occupational History  . Not on file  Social Needs  . Financial resource strain: Not on file  . Food insecurity:    Worry: Not on file    Inability: Not on file  . Transportation needs:    Medical: Not on file    Non-medical: Not on file  Tobacco Use  . Smoking status: Light Tobacco Smoker    Packs/day: 0.25    Years: 11.00    Pack years: 2.75    Types: Cigarettes    Last attempt to quit: 11/28/2016    Years since quitting: 0.9  . Smokeless tobacco: Never Used  Substance and Sexual Activity  . Alcohol use: Not Currently    Alcohol/week: 1.2 oz    Types: 2 Cans of beer per week    Comment: stopped a week ago (11/20/17) EK,RN  . Drug use: No    Types: "Crack" cocaine, Cocaine  . Sexual activity: Yes    Partners: Male    Birth control/protection: None  Lifestyle  . Physical activity:    Days per week: Not on file    Minutes per session: Not on file  . Stress: Not on file  Relationships  . Social connections:  Talks on phone: Not on file    Gets together: Not on file    Attends religious service: Not on file    Active member of club or organization: Not on file    Attends meetings of clubs or organizations: Not on file    Relationship status: Not on file  Other Topics Concern  . Not on file  Social History Narrative   Lives with Esmeralda Arthur   Caffeine use: soda sometimes.   Right handed    Family History  Problem Relation Age of Onset  . Cancer Mother   . Hypertension Mother   . Diabetes Mother   . Stroke Father    O: Component Value Date/Time   CHOL 230 (H) 07/08/2015 1028   HDL 128 07/08/2015 1028   TRIG 40 07/08/2015 1028   AST 14 (L) 11/18/2017 0927   ALT 11 11/18/2017 0927   NA 140 11/18/2017 0934   NA 139 10/29/2016 1030   K 4.0 11/18/2017 0934   CL 109 11/18/2017 0934   CO2 22 11/18/2017 0927   GLUCOSE 87 11/18/2017 0934   HGBA1C 5.3 07/08/2015 1043   BUN 26  (H) 11/18/2017 0934   BUN 21 10/29/2016 1030   CREATININE 1.40 (H) 11/18/2017 0934   CALCIUM 9.2 11/18/2017 0927   GFRNONAA 40 (L) 11/18/2017 0927   GFRAA 46 (L) 11/18/2017 0927   WBC 7.4 11/18/2017 0927   HGB 13.3 11/18/2017 0934   HCT 39.0 11/18/2017 0934   PLT 371 11/18/2017 0927   Ht Readings from Last 2 Encounters:  11/20/17 5\' 3"  (1.6 m)  11/18/17 5\' 3"  (1.6 m)   Wt Readings from Last 2 Encounters:  11/20/17 144 lb 3.2 oz (65.4 kg)  11/18/17 145 lb (65.8 kg)   There is no height or weight on file to calculate BMI. BP Readings from Last 3 Encounters:  11/21/17 (!) 201/94  11/20/17 121/80  11/18/17 (!) 150/88    A/P: Patient states she has not taken any medications today, is all out of her medications and needs to re-enroll in the [facility] [place] pharmacy program. On 11/18/17, patient was seen in emergency department for hypertension and headache. At that time, she presented with aphasia. MRI was negative and BP/symptoms resolved so patient was discharged with no changes to home meds. On 11/20/17, patient was seen by neurology and received ketorolac IM 30 mg x 1 and a prescription for depakote ER 500 daily to treat migraine.  Today, worked with patient to re-enroll in [facility]. While working with patient, she reported headache 10/10 and seemed anxious in clinic. She disclosed using cocaine 1-2 weeks ago, but not other illicit substances since then. No other symptoms of concern. BP elevated and pulse bradycardic. Patient took 1 dose of HCTZ 25 mg in clinic. Held her dose of amlodipine due to pulse. Patient was advised to go to emergency department (no availability to be seen in [facility]). Education was also provided to minimize use of ibuprofen considering CV risk and CKD. Educated on safe use of acetaminophen and topical pain agents and provided samples of these.  Patient was transported to emergency department and was advised to follow up in 1 week or sooner if any changes  in condition or questions regarding medications arise.   The patient verbalized understanding of information provided by repeating back concepts discussed.

## 2017-11-21 NOTE — ED Notes (Signed)
PA made aware of BP. Pt advised to take home mediations once discharged.

## 2017-11-21 NOTE — ED Provider Notes (Signed)
[facility] EMERGENCY DEPARTMENT Provider Note   CSN: [id number] Arrival date & time: 11/21/17  0623     History   Chief Complaint Chief Complaint  Patient presents with  . Hypertension    HPI Jean [name] is a 52 y.o. female.  HPI Patient presents to the emergency department with elevated blood pressure from her primary doctor's office.  Patient states that she has not been having symptoms but was sent in by her primary doctor.  Patient states that nothing seems to make her condition better where she states she did not take her medications today.  The patient denies chest pain, shortness of breath,blurred vision, neck pain, fever, cough, weakness, numbness, dizziness, anorexia, edema, abdominal pain, nausea, vomiting, diarrhea, rash, back pain, dysuria, hematemesis, bloody stool, near syncope, or syncope.  Patient has a headache but that is been present since Monday. Past Medical History:  Diagnosis Date  . Arthritis    hip  . Asthma   . Carpal tunnel syndrome    bilateral  . Chronic lower back pain   . Depression   . Dyspnea    with exertion  . GERD (gastroesophageal reflux disease)   . Headache    migraines  . Herpes   . Hypertension   . SVD (spontaneous vaginal delivery)    x 2  . Uterine leiomyoma 02/16/2016    Patient Active Problem List   Diagnosis Date Noted  . Dyspareunia in female 09/13/2017  . Adnexal mass 07/10/2017  . Spinal stenosis of cervical region 07/01/2017  . Cervicalgia 04/24/2017  . CKD (chronic kidney disease) 10/29/2016  . Chest wall pain 10/15/2016  . Chronic pain syndrome 05/10/2016  . Liver lesion, right lobe 12/01/2015  . MDD (major depressive disorder) 10/19/2015  . Dyspnea on exertion 08/22/2015  . HTN (hypertension) 07/08/2015  . Routine health maintenance 07/08/2015  . Tobacco abuse 07/08/2015    Past Surgical History:  Procedure Laterality Date  . SALPINGECTOMY Left    lap - ectopic preg  . TOTAL HIP  ARTHROPLASTY Left 01/20/2016   Procedure: LEFT TOTAL HIP ARTHROPLASTY ANTERIOR APPROACH;  Surgeon: Mcarthur Rossetti, MD;  Location: [facility];  Service: Orthopedics;  Laterality: Left;  Marland Kitchen VAGINAL HYSTERECTOMY Right 12/11/2016   Procedure: HYSTERECTOMY VAGINAL WITH RIGHT SALPINGECTOMY;  Surgeon: Chancy Milroy, MD;  Location: [facility];  Service: Gynecology;  Laterality: Right;     OB History    Gravida  3   Para  1   Term  1   Preterm      AB  2   Living  1     SAB      TAB  1   Ectopic  1   Multiple      Live Births               Home Medications    Prior to Admission medications   Medication Sig Start Date End Date Taking? Authorizing Provider  albuterol (PROVENTIL HFA;VENTOLIN HFA) 108 (90 Base) MCG/ACT inhaler Inhale 2 puffs into the lungs every 6 (six) hours as needed for wheezing or shortness of breath. 10/14/17   Alphonzo Grieve, MD  amLODipine (NORVASC) 5 MG tablet Take 1 tablet (5 mg total) by mouth daily. 11/21/17 01/20/18  Alphonzo Grieve, MD  ASPIRIN ADULT PO Take 1 tablet by mouth daily.    [provider]  cyclobenzaprine (FLEXERIL) 10 MG tablet Take 1 tablet (10 mg total) by mouth 2 (two) times daily as needed  for muscle spasms. 08/29/17   Alphonzo Grieve, MD  divalproex (DEPAKOTE ER) 500 MG 24 hr tablet Take 1 tablet (500 mg total) by mouth daily. 11/20/17   Garvin Fila, MD  gabapentin (NEURONTIN) 300 MG capsule Take 1 capsule (300 mg total) by mouth at bedtime. 08/29/17   Alphonzo Grieve, MD  hydrochlorothiazide (HYDRODIURIL) 25 MG tablet Take 1 tablet (25 mg total) by mouth daily. 11/21/17   Alphonzo Grieve, MD  ibuprofen (IBU) 600 MG tablet Take 1 tablet (600 mg total) by mouth every 6 (six) hours as needed. Patient taking differently: Take 600 mg by mouth every 6 (six) hours as needed for headache or mild pain.  10/14/17   Alphonzo Grieve, MD  ranitidine (ZANTAC) 150 MG tablet Take 1 tablet (150 mg total) by mouth 2 (two) times daily. 08/29/17    Alphonzo Grieve, MD  sertraline (ZOLOFT) 50 MG tablet Take 2 tablets (100 mg total) by mouth daily. 04/24/17 04/24/18  Thomasene Ripple, MD  tetrahydrozoline 0.05 % ophthalmic solution Place 1 drop into both eyes daily as needed (dry eyes).    [provider]    Family History Family History  Problem Relation Age of Onset  . Cancer Mother   . Hypertension Mother   . Diabetes Mother   . Stroke Father     Social History Social History   Tobacco Use  . Smoking status: Light Tobacco Smoker    Packs/day: 0.25    Years: 11.00    Pack years: 2.75    Types: Cigarettes    Last attempt to quit: 11/28/2016    Years since quitting: 0.9  . Smokeless tobacco: Never Used  Substance Use Topics  . Alcohol use: Not Currently    Alcohol/week: 1.2 oz    Types: 2 Cans of beer per week    Comment: stopped a week ago (11/20/17) EK,RN  . Drug use: No    Types: "Crack" cocaine, Cocaine     Allergies   Lisinopril   Review of Systems Review of Systems All other systems negative except as documented in the HPI. All pertinent positives and negatives as reviewed in the HPI.  Physical Exam Updated Vital Signs BP (!) 156/97 (BP Location: Right Arm)   Pulse (!) 45 Comment: [facility] Santiago Glad notified   Temp 98.1 F (36.7 C) (Oral)   Resp 14   Ht 5\' 3"  (1.6 m)   Wt 65.3 kg (144 lb)   LMP 12/08/2016 (Approximate)   SpO2 100%   BMI 25.51 kg/m   Physical Exam  Constitutional: She is oriented to person, place, and time. She appears well-developed and well-nourished. No distress.  HENT:  Head: Normocephalic and atraumatic.  Mouth/Throat: Oropharynx is clear and moist.  Eyes: Pupils are equal, round, and reactive to light.  Neck: Normal range of motion. Neck supple.  Cardiovascular: Normal rate, regular rhythm and normal heart sounds. Exam reveals no gallop and no friction rub.  No murmur heard. Pulmonary/Chest: Effort normal and breath sounds normal. No respiratory distress. She has  no wheezes.  Abdominal: Soft. Bowel sounds are normal. She exhibits no distension. There is no tenderness.  Neurological: She is alert and oriented to person, place, and time. She exhibits normal muscle tone. Coordination normal.  Skin: Skin is warm and dry. Capillary refill takes less than 2 seconds. No rash noted. No erythema.  Psychiatric: She has a normal mood and affect. Her behavior is normal.  Nursing note and vitals reviewed.    ED Treatments /  Results  Labs (all labs ordered are listed, but only abnormal results are displayed) Labs Reviewed - No data to display  EKG None  Radiology No results found.  Procedures Procedures (including critical care time)  Medications Ordered in ED Medications  ibuprofen (ADVIL,MOTRIN) tablet 800 mg (800 mg Oral Given 11/21/17 1336)     Initial Impression / Assessment and Plan / ED Course  I have reviewed the triage vital signs and the nursing notes.  Pertinent labs & imaging results that were available during my care of the patient were reviewed by me and considered in my medical decision making (see chart for details).     Patient will be referred back to her primary doctor.  Patient is advised to take her blood pressure medications as prescribed.  Patient agrees the plan and all questions were answered.  Final Clinical Impressions(s) / ED Diagnoses   Final diagnoses:  None    ED Discharge Orders    None       Dalia Heading, Hershal Coria 11/21/17 1352    Carmin Muskrat, MD 11/22/17 [id number]

## 2017-11-21 NOTE — Discharge Instructions (Addendum)
Return here as needed.  Follow-up with your primary care doctor °

## 2017-11-21 NOTE — ED Triage Notes (Signed)
PT came to ED from [facility] . Pt had HA and high BP. Pt reports a HA. Pt can not afford HTN meds. Pt has not had HTN meds for three days. Pt has orange card with med assistance.

## 2017-11-21 NOTE — ED Notes (Signed)
Pt expressed that she wanted to leave; VS were re-taken and Pt was convinced to stay. HR of 45 reported to Santiago Glad, Therapist, [facility].

## 2017-11-22 NOTE — Telephone Encounter (Signed)
error 

## 2017-11-22 NOTE — Progress Notes (Signed)
I reviewed patients chart and Dr. Julianne Rice note, agree with plan.

## 2017-11-26 ENCOUNTER — Ambulatory Visit (INDEPENDENT_AMBULATORY_CARE_PROVIDER_SITE_OTHER): Payer: Self-pay | Admitting: Pharmacist

## 2017-11-26 ENCOUNTER — Ambulatory Visit (INDEPENDENT_AMBULATORY_CARE_PROVIDER_SITE_OTHER): Payer: Self-pay | Admitting: Internal Medicine

## 2017-11-26 ENCOUNTER — Encounter: Payer: Self-pay | Admitting: Internal Medicine

## 2017-11-26 DIAGNOSIS — I10 Essential (primary) hypertension: Secondary | ICD-10-CM

## 2017-11-26 DIAGNOSIS — K219 Gastro-esophageal reflux disease without esophagitis: Secondary | ICD-10-CM

## 2017-11-26 DIAGNOSIS — Z23 Encounter for immunization: Secondary | ICD-10-CM

## 2017-11-26 DIAGNOSIS — F1490 Cocaine use, unspecified, uncomplicated: Secondary | ICD-10-CM

## 2017-11-26 DIAGNOSIS — J45909 Unspecified asthma, uncomplicated: Secondary | ICD-10-CM

## 2017-11-26 DIAGNOSIS — F329 Major depressive disorder, single episode, unspecified: Secondary | ICD-10-CM

## 2017-11-26 DIAGNOSIS — Z79899 Other long term (current) drug therapy: Secondary | ICD-10-CM

## 2017-11-26 MED ORDER — AMLODIPINE BESYLATE 5 MG PO TABS: 5.0000 mg | ORAL_TABLET | Freq: Every day | ORAL | 3 refills | Status: DC

## 2017-11-26 MED ORDER — HYDROCHLOROTHIAZIDE 25 MG PO TABS: 25.0000 mg | ORAL_TABLET | Freq: Every day | ORAL | 3 refills | Status: DC

## 2017-11-26 NOTE — Patient Instructions (Signed)
Jean [name]  We are glad to see that you are doing much better compared to last week. Please make sure to take your medication as prescribed and avoid foods high in salt. Thank you for visiting the clinic.   Low-Sodium Eating Plan Sodium, which is an element that makes up salt, helps you maintain a healthy balance of fluids in your body. Too much sodium can increase your blood pressure and cause fluid and waste to be held in your body. Your health care provider or dietitian may recommend following this plan if you have high blood pressure (hypertension), kidney disease, liver disease, or heart failure. Eating less sodium can help lower your blood pressure, reduce swelling, and protect your heart, liver, and kidneys. What are tips for following this plan? General guidelines  Most people on this plan should limit their sodium intake to 1,500-2,000 mg (milligrams) of sodium each day. Reading food labels  The Nutrition Facts label lists the amount of sodium in one serving of the food. If you eat more than one serving, you must multiply the listed amount of sodium by the number of servings.  Choose foods with less than 140 mg of sodium per serving.  Avoid foods with 300 mg of sodium or more per serving. Shopping  Look for lower-sodium products, often labeled as "low-sodium" or "no salt added."  Always check the sodium content even if foods are labeled as "unsalted" or "no salt added".  Buy fresh foods. ? Avoid canned foods and premade or frozen meals. ? Avoid canned, cured, or processed meats  Buy breads that have less than 80 mg of sodium per slice. Cooking  Eat more home-cooked food and less restaurant, buffet, and fast food.  Avoid adding salt when cooking. Use salt-free seasonings or herbs instead of table salt or sea salt. Check with your health care provider or pharmacist before using salt substitutes.  Cook with plant-based oils, such as canola, sunflower, or olive oil. Meal  planning  When eating at a restaurant, ask that your food be prepared with less salt or no salt, if possible.  Avoid foods that contain MSG (monosodium glutamate). MSG is sometimes added to [place] food, bouillon, and some canned foods. What foods are recommended? The items listed may not be a complete list. Talk with your dietitian about what dietary choices are best for you. Grains Low-sodium cereals, including oats, puffed wheat and rice, and shredded wheat. Low-sodium crackers. Unsalted rice. Unsalted pasta. Low-sodium bread. Whole-grain breads and whole-grain pasta. Vegetables Fresh or frozen vegetables. "No salt added" canned vegetables. "No salt added" tomato sauce and paste. Low-sodium or reduced-sodium tomato and vegetable juice. Fruits Fresh, frozen, or canned fruit. Fruit juice. Meats and other protein foods Fresh or frozen (no salt added) meat, poultry, seafood, and fish. Low-sodium canned tuna and salmon. Unsalted nuts. Dried peas, beans, and lentils without added salt. Unsalted canned beans. Eggs. Unsalted nut butters. Dairy Milk. Soy milk. Cheese that is naturally low in sodium, such as ricotta cheese, fresh mozzarella, or Swiss cheese Low-sodium or reduced-sodium cheese. Cream cheese. Yogurt. Fats and oils Unsalted butter. Unsalted margarine with no trans fat. Vegetable oils such as canola or olive oils. Seasonings and other foods Fresh and dried herbs and spices. Salt-free seasonings. Low-sodium mustard and ketchup. Sodium-free salad dressing. Sodium-free light mayonnaise. Fresh or refrigerated horseradish. Lemon juice. Vinegar. Homemade, reduced-sodium, or low-sodium soups. Unsalted popcorn and pretzels. Low-salt or salt-free chips. What foods are not recommended? The items listed may not be a complete list.  Talk with your dietitian about what dietary choices are best for you. Grains Instant hot cereals. Bread stuffing, pancake, and biscuit mixes. Croutons. Seasoned rice or  pasta mixes. Noodle soup cups. Boxed or frozen macaroni and cheese. Regular salted crackers. Self-rising flour. Vegetables Sauerkraut, pickled vegetables, and relishes. Olives. [place] fries. Onion rings. Regular canned vegetables (not low-sodium or reduced-sodium). Regular canned tomato sauce and paste (not low-sodium or reduced-sodium). Regular tomato and vegetable juice (not low-sodium or reduced-sodium). Frozen vegetables in sauces. Meats and other protein foods Meat or fish that is salted, canned, smoked, spiced, or pickled. Bacon, ham, sausage, hotdogs, corned beef, chipped beef, packaged lunch meats, salt pork, jerky, pickled herring, anchovies, regular canned tuna, sardines, salted nuts. Dairy Processed cheese and cheese spreads. Cheese curds. Blue cheese. Feta cheese. String cheese. Regular cottage cheese. Buttermilk. Canned milk. Fats and oils Salted butter. Regular margarine. Ghee. Bacon fat. Seasonings and other foods Onion salt, garlic salt, seasoned salt, table salt, and sea salt. Canned and packaged gravies. Worcestershire sauce. Tartar sauce. Barbecue sauce. Teriyaki sauce. Soy sauce, including reduced-sodium. Steak sauce. Fish sauce. Oyster sauce. Cocktail sauce. Horseradish that you find on the shelf. Regular ketchup and mustard. Meat flavorings and tenderizers. Bouillon cubes. Hot sauce and Tabasco sauce. Premade or packaged marinades. Premade or packaged taco seasonings. Relishes. Regular salad dressings. Salsa. Potato and tortilla chips. Corn chips and puffs. Salted popcorn and pretzels. Canned or dried soups. Pizza. Frozen entrees and pot pies. Summary  Eating less sodium can help lower your blood pressure, reduce swelling, and protect your heart, liver, and kidneys.  Most people on this plan should limit their sodium intake to 1,500-2,000 mg (milligrams) of sodium each day.  Canned, boxed, and frozen foods are high in sodium. Restaurant foods, fast foods, and pizza are also  very high in sodium. You also get sodium by adding salt to food.  Try to cook at home, eat more fresh fruits and vegetables, and eat less fast food, canned, processed, or prepared foods. This information is not intended to replace advice given to you by your health care provider. Make sure you discuss any questions you have with your health care provider. Document Released: 10/13/2001 Document Revised: 04/16/2016 Document Reviewed: 04/16/2016 Elsevier Interactive Patient Education  [employer].

## 2017-11-26 NOTE — Assessment & Plan Note (Signed)
BP Readings from Last 3 Encounters:  11/26/17 118/69  11/21/17 (!) 193/67  11/21/17 (!) 201/94   - Patient with follow up after symptomatic hypertension and consequent ED visit - + Cocaine on UDS - Patient had been out of her blood pressure meds for 11 days before the ED visit due to the delayed refill process of mail order pharmacy - Patient also states she had been taking either amlodipine or HCTZ because she was under the impression that she should not take both - States she had been taking both medication as prescribed for the last 4 days  - Re-affirmed patient's understanding of her prescribed dosages and counseled on prompt refilling of her medications - Advised patient on her cocaine use, she states she is trying to reduce her use - BP well controlled on current regimen - Will send refills to pharmacy

## 2017-11-26 NOTE — Progress Notes (Signed)
Patient was seen in clinic with Brett Fairy, PharmD candidate. I agree with the assessment and plan of care documented.

## 2017-11-26 NOTE — Progress Notes (Signed)
Ms. Rowe was seen in clinic today for follow-up medication reconciliation from visit last week. At visit last week patient's pressure was elevated and pulse was found to be low and patient complained of a headache, so she was sent to the ED. In the ED, patient was given ibuprofen 600 mg for headache. In clinic today, advised patient to avoid ibuprofen due to her CKD; suggested up to 3g of acetaminophen in three doses daily as needed instead. An amlodipine fill that was sent to [facility] outpatient pharmacy and retrieved prior to ED visit was held in clinic due to low blood pressure.   At visit today pulse was 55 with blood pressure of 118/69. Subjectively, patient states she feels much better as compared to last week. Medications were reviewed with patient with extra emphasis on blood pressure medications due to her recent ED visit. Patient has been taking hydrochlorothiazide and "another white pill" which she believes is amlodipine. Patient is currently disenrolled from [facility], where she normally gets her medications, so amlodipine would have to have been from an old fill in 08/2017. Today patient was given the held one-month supply of amlodipine filled at [facility] outpatient pharmacy last week. [facility] enrollment form was faxed last week but patient status is still pending; form was re-faxed today.   Asked patient about her smoking cessation status, and she states she is down to one cigarette daily. Denied interest in NRT for assisted cessation. States she will be trying complete cessation starting today. Pneumovax (PPSV23) was recommended for patient on the basis of smoking and CKD status; vaccine was administered in clinic.   At patient's request, she was given a pillcutter for use with acetaminophen.  Brett Fairy, PharmD Candidate

## 2017-11-26 NOTE — Progress Notes (Signed)
   CC: Hypertension management  HPI: [name] is a 52 y.o. F w/ PMH of HTN, Asthma, Depression and GERD presenting to the clinic for follow up after ED visit for hypertensive emergency. Patient came to the clinic last week and was found to have blood pressure of 201/94 with headaches and speech changes and was taken to the ED. She was given her home anti-hypertensive medications and was discharged after her vitals stabilized. She was found to be positive for cocaine on UDS at the ED. Since discharge, she had been taking her home blood pressure medication as prescribed. She states she had mistakenly thought that she was not allowed to take more than 1 blood pressure med but has now been taking both her amlodipine and HCTZ for the last 4 days. She denies any light-headedness, dizziness, headaches, blurry vision, chest pain or palpitations.  Past Medical History:  Diagnosis Date  . Arthritis    hip  . Asthma   . Carpal tunnel syndrome    bilateral  . Chronic lower back pain   . Depression   . Dyspnea    with exertion  . GERD (gastroesophageal reflux disease)   . Headache    migraines  . Herpes   . Hypertension   . SVD (spontaneous vaginal delivery)    x 2  . Uterine leiomyoma 02/16/2016    Review of Systems: Review of Systems  Constitutional: Negative for chills, fever and malaise/fatigue.  Eyes: Negative for blurred vision, double vision, pain and discharge.  Cardiovascular: Negative for chest pain, palpitations, orthopnea and leg swelling.  Neurological: Negative for dizziness, tingling, sensory change, speech change, weakness and headaches.     Physical Exam: Vitals:   11/26/17 1000  BP: 118/69  Pulse: (!) 55  Temp: 98.1 F (36.7 C)  TempSrc: Oral  SpO2: 100%  Weight: 145 lb 1.6 oz (65.8 kg)    Physical Exam  Constitutional: She appears well-developed and well-nourished. No distress.  Cardiovascular: Normal rate, regular rhythm, normal heart sounds and  intact distal pulses.  Respiratory: Effort normal and breath sounds normal. No respiratory distress. She has no wheezes.  GI: Soft. Bowel sounds are normal. She exhibits no distension. There is tenderness (tenderness to deep palpation). There is no guarding.  Musculoskeletal: Normal range of motion. She exhibits no edema, tenderness or deformity.    Assessment & Plan:   See Encounters Tab for problem based charting.  Patient seen with Dr. Daryll Drown  -Gilberto Better, PGY1

## 2017-11-27 ENCOUNTER — Ambulatory Visit (INDEPENDENT_AMBULATORY_CARE_PROVIDER_SITE_OTHER): Payer: Self-pay | Admitting: Orthopaedic Surgery

## 2017-11-28 ENCOUNTER — Ambulatory Visit (INDEPENDENT_AMBULATORY_CARE_PROVIDER_SITE_OTHER): Payer: Self-pay | Admitting: Physician Assistant

## 2017-11-28 NOTE — Progress Notes (Signed)
Internal Medicine Clinic Attending  I saw and evaluated the patient.  I personally confirmed the key portions of the history and exam documented by Dr. Lee and I reviewed pertinent patient test results.  The assessment, diagnosis, and plan were formulated together and I agree with the documentation in the resident's note.  

## 2017-12-05 ENCOUNTER — Ambulatory Visit (INDEPENDENT_AMBULATORY_CARE_PROVIDER_SITE_OTHER): Payer: Self-pay | Admitting: Orthopaedic Surgery

## 2017-12-05 ENCOUNTER — Encounter (INDEPENDENT_AMBULATORY_CARE_PROVIDER_SITE_OTHER): Payer: Self-pay | Admitting: Orthopaedic Surgery

## 2017-12-05 DIAGNOSIS — G8929 Other chronic pain: Secondary | ICD-10-CM

## 2017-12-05 DIAGNOSIS — M25561 Pain in right knee: Secondary | ICD-10-CM

## 2017-12-05 NOTE — Progress Notes (Signed)
The patient comes in for follow-up today to go over an MRI of her right knee.  She has been in and out of the ER with chronic pain.  Plain films are negative of her knee but due to the swelling she kept complaining of life I will obtain an MRI.  She is ambulate with a cane and using a hinged knee brace and still reports swelling in her knee.  She has a history of a left total hip arthroplasty that we did 23 months ago.  She understands that I did review her recent emergency room visit history and she did have evidence of cocaine in her urine last month.  She said that she is off of that now.  On examination of her right knee there is no swelling today at all and her knee exam is basically normal other than pain.  He is ligamentously stable.  MRIs reviewed of her right knee and shows only some edema in Hoffa's fat pad but otherwise intact ligamentous structures.  The meniscus are intact bilaterally and there is only minimal cartilage thinning.  I gave her reassurance that there is no internal derangement in her right knee that required any intervention at all.  I did offer a steroid injection in the knee but she declined this.  Follow-up will be as needed.

## 2017-12-10 ENCOUNTER — Other Ambulatory Visit: Payer: Self-pay | Admitting: Internal Medicine

## 2017-12-10 NOTE — Telephone Encounter (Signed)
NEEDS REFILL ON B/P MEDICATION,

## 2017-12-10 NOTE — Telephone Encounter (Signed)
Lm for rtc, both scripts sent to [facility]

## 2017-12-20 ENCOUNTER — Other Ambulatory Visit: Payer: Self-pay | Admitting: *Deleted

## 2017-12-20 DIAGNOSIS — F4321 Adjustment disorder with depressed mood: Secondary | ICD-10-CM

## 2017-12-20 DIAGNOSIS — G894 Chronic pain syndrome: Secondary | ICD-10-CM

## 2017-12-20 DIAGNOSIS — R0609 Other forms of dyspnea: Secondary | ICD-10-CM

## 2017-12-22 MED ORDER — RANITIDINE HCL 150 MG PO TABS: 150.0000 mg | ORAL_TABLET | Freq: Two times a day (BID) | ORAL | 1 refills | Status: DC

## 2017-12-22 MED ORDER — CYCLOBENZAPRINE HCL 10 MG PO TABS
10.0000 mg | ORAL_TABLET | Freq: Two times a day (BID) | ORAL | 3 refills | Status: DC | PRN
Start: 2017-12-22 — End: 2018-04-24

## 2017-12-22 MED ORDER — GABAPENTIN 300 MG PO CAPS: 300.0000 mg | ORAL_CAPSULE | Freq: Every day | ORAL | 1 refills | Status: DC

## 2017-12-22 MED ORDER — SERTRALINE HCL 50 MG PO TABS: 100.0000 mg | ORAL_TABLET | Freq: Every day | ORAL | 1 refills | Status: DC

## 2017-12-22 MED ORDER — ALBUTEROL SULFATE HFA 108 (90 BASE) MCG/ACT IN AERS: 2.0000 | INHALATION_SPRAY | Freq: Four times a day (QID) | RESPIRATORY_TRACT | 1 refills | Status: DC | PRN

## 2017-12-23 ENCOUNTER — Encounter: Payer: Self-pay | Admitting: Obstetrics and Gynecology

## 2017-12-23 ENCOUNTER — Ambulatory Visit (INDEPENDENT_AMBULATORY_CARE_PROVIDER_SITE_OTHER): Payer: Self-pay | Admitting: Obstetrics and Gynecology

## 2017-12-23 VITALS — BP 134/67 | HR 54 | Ht 63.0 in | Wt 143.8 lb

## 2017-12-23 DIAGNOSIS — N949 Unspecified condition associated with female genital organs and menstrual cycle: Secondary | ICD-10-CM

## 2017-12-23 DIAGNOSIS — N9489 Other specified conditions associated with female genital organs and menstrual cycle: Secondary | ICD-10-CM

## 2017-12-23 NOTE — Patient Instructions (Signed)
Diagnostic Laparoscopy  A diagnostic laparoscopy is a procedure to diagnose diseases in the abdomen. During the procedure, a thin, lighted, pencil-sized instrument called a laparoscope is inserted into the abdomen through an incision. The laparoscope allows your health care provider to look at the organs inside your body.  Tell a health care provider about:   Any allergies you have.   All medicines you are taking, including vitamins, herbs, eye drops, creams, and over-the-counter medicines.   Any problems you or family members have had with anesthetic medicines.   Any blood disorders you have.   Any surgeries you have had.   Any medical conditions you have.  What are the risks?  Generally, this is a safe procedure. However, problems can occur, which may include:   Infection.   Bleeding.   Damage to other organs.   Allergic reaction to the anesthetics used during the procedure.    What happens before the procedure?   Do not eat or drink anything after midnight on the night before the procedure or as directed by your health care provider.   Ask your health care provider about:  ? Changing or stopping your regular medicines.  ? Taking medicines such as aspirin and ibuprofen. These medicines can thin your blood. Do not take these medicines before your procedure if your health care provider instructs you not to.   Plan to have someone take you home after the procedure.  What happens during the procedure?   You may be given a medicine to help you relax (sedative).   You will be given a medicine to make you sleep (general anesthetic).   Your abdomen will be inflated with a gas. This will make your organs easier to see.   Small incisions will be made in your abdomen.   A laparoscope and other small instruments will be inserted into the abdomen through the incisions.   A tissue sample may be removed from an organ in the abdomen for examination.   The instruments will be removed from the abdomen.   The  gas will be released.   The incisions will be closed with stitches (sutures).  What happens after the procedure?  Your blood pressure, heart rate, breathing rate, and blood oxygen level will be monitored often until the medicines you were given have worn off.  This information is not intended to replace advice given to you by your health care provider. Make sure you discuss any questions you have with your health care provider.  Document Released: 07/30/2000 Document Revised: 09/01/2015 Document Reviewed: 12/04/2013  Elsevier Interactive Patient Education  2018 [employer]

## 2017-12-23 NOTE — Progress Notes (Signed)
Jean [name] presents for follow up from GYN U/S on 5/19. Pt had TVH with Right salpingectomy 8/18. Pt did not follow up for post op appt. Pt was seen in ER with pain. U/S revealed remnant fibroid in broad ligament. F/U U/S 5/19 confirmed.  PE AF VSS Lungs clear Heart RRR Abd soft + BS GU deferred  A/P Adnexal mass probable remnant fibroid  U/S findings and Dx reviewed with pt. Management options reviewed with pt. Conservative vs surgerical. Pt desires surgerical. Laparoscopic removal of fibroid reviewed with pt. R/B/Post op care discussed. Pt desires to schedule Pt will be contacted with date and time of surgery. F/U post op

## 2017-12-25 ENCOUNTER — Encounter (HOSPITAL_COMMUNITY): Payer: Self-pay

## 2018-01-15 ENCOUNTER — Other Ambulatory Visit: Payer: Self-pay

## 2018-01-15 ENCOUNTER — Encounter: Payer: Self-pay | Admitting: Internal Medicine

## 2018-01-15 ENCOUNTER — Ambulatory Visit (INDEPENDENT_AMBULATORY_CARE_PROVIDER_SITE_OTHER): Payer: Self-pay | Admitting: Internal Medicine

## 2018-01-15 VITALS — BP 131/78 | HR 62 | Temp 98.5°F | Ht 63.0 in | Wt 144.2 lb

## 2018-01-15 DIAGNOSIS — F320 Major depressive disorder, single episode, mild: Secondary | ICD-10-CM

## 2018-01-15 DIAGNOSIS — F329 Major depressive disorder, single episode, unspecified: Secondary | ICD-10-CM

## 2018-01-15 DIAGNOSIS — N9489 Other specified conditions associated with female genital organs and menstrual cycle: Secondary | ICD-10-CM

## 2018-01-15 DIAGNOSIS — N949 Unspecified condition associated with female genital organs and menstrual cycle: Secondary | ICD-10-CM

## 2018-01-15 DIAGNOSIS — Z90721 Acquired absence of ovaries, unilateral: Secondary | ICD-10-CM

## 2018-01-15 DIAGNOSIS — Z90711 Acquired absence of uterus with remaining cervical stump: Secondary | ICD-10-CM

## 2018-01-15 DIAGNOSIS — Z96641 Presence of right artificial hip joint: Secondary | ICD-10-CM

## 2018-01-15 DIAGNOSIS — Z79899 Other long term (current) drug therapy: Secondary | ICD-10-CM

## 2018-01-15 DIAGNOSIS — I10 Essential (primary) hypertension: Secondary | ICD-10-CM

## 2018-01-15 DIAGNOSIS — F1490 Cocaine use, unspecified, uncomplicated: Secondary | ICD-10-CM

## 2018-01-15 NOTE — Progress Notes (Signed)
   CC: HTN  HPI:  [name] is a 52 y.o. with a PMH of cocaine use disorder, HTN, OA s/p right hip replacement, adnexal mass, MDD presenting to clinic for follow up on HTN.  HTN: Patient states compliance with amlodipine 5mg  daily and HCTZ 25mg  daily; she reports chronic unchanged HAs for which she will f/u with neurology; she denies recurrence of focal weakness, numbness, tingling. She denies chest pain or shortness of breath.  MDD: Patient states that previous to her last ED visit, she would treat her bad depression days with getting high on cocaine, however since that experience (came in as code stroke for unilateral weakness, HA, dysarthria 2/2 cocaine induced vasospasm), she states she has started going to NA meetings and talking through her feelings. She feels otherwise the zoloft is doing well.  Adnexal mass: Patient endorses persistent mild, but improved RLQ pain; she was found to have a right adnexal mass on u/s which is thought to be a remnant fibroid (patient had previous left oophorectomy and salpingectomy 2/2 ectopic pregnancy, then partial hysterectomy 2/2 symptomatic fibroids). She is scheduled for diagnostic laparoscopic surgery with gyn in October.  Please see problem based Assessment and Plan for status of patients chronic conditions.  Past Medical History:  Diagnosis Date  . Adnexal mass 2019   likely remnant fibroid on broad ligament; getting diagnostic lap  . Arthritis    hip  . Asthma   . Carpal tunnel syndrome    bilateral  . Chronic lower back pain   . Depression   . Dyspnea    with exertion  . GERD (gastroesophageal reflux disease)   . Headache    migraines  . Herpes   . Hypertension   . SVD (spontaneous vaginal delivery)    x 2  . Uterine leiomyoma 02/16/2016   s/p vaginal hysterectomy 2018    Review of Systems:   Per HPI  Physical Exam:  Vitals:   01/15/18 1506  BP: 131/78  Pulse: 62  Temp: 98.5 F (36.9 C)  TempSrc: Oral    SpO2: 99%  Weight: 144 lb 3.2 oz (65.4 kg)  Height: 5\' 3"  (1.6 m)   GENERAL- alert, co-operative, appears as stated age, not in any distress. HEENT- Atraumatic, normocephalic, PERRL, EOMI, oral mucosa appears moist. CARDIAC- RRR, no murmurs, rubs or gallops. RESP- Moving equal volumes of air, and clear to auscultation bilaterally, no wheezes or crackles. ABDOMEN- Soft, nontender, bowel sounds present. NEURO- CN 2-12 grossly intact. EXTREMITIES- pulse 2+, symmetric, no pedal edema. SKIN- Warm, dry, no rash or lesion. PSYCH- Normal mood and affect, appropriate thought content and speech.  Assessment & Plan:   See Encounters Tab for problem based charting.   Patient discussed with Dr. Moshe Cipro, MD Internal Medicine PGY-3

## 2018-01-15 NOTE — Patient Instructions (Signed)
It was nice seeing you today, I'm glad you're doing well!  Good luck with your surgery; let us know how we can help you. We'll plan on seeing you in about 3 months

## 2018-01-16 ENCOUNTER — Encounter: Payer: Self-pay | Admitting: Internal Medicine

## 2018-01-16 NOTE — Assessment & Plan Note (Signed)
Stable.  Plan: --continue HCTZ 25mg  daily and amlodipine 5mg  daily --encouraged continued avoidance of cocaine use

## 2018-01-16 NOTE — Assessment & Plan Note (Signed)
Stable. She will undergo diagnostic laparoscopy in October.

## 2018-01-16 NOTE — Progress Notes (Signed)
Internal Medicine Clinic Attending  Case discussed with Dr. Svalina at the time of the visit.  We reviewed the resident's history and exam and pertinent patient test results.  I agree with the assessment, diagnosis, and plan of care documented in the resident's note.  Alexander Raines, M.D., Ph.D.  

## 2018-01-16 NOTE — Assessment & Plan Note (Signed)
Patient states she is stable on zoloft 100mg  daily and has been going to support groups which she finds really helpful.  Plan: --continue current regimen

## 2018-02-10 ENCOUNTER — Encounter: Payer: Self-pay | Admitting: Adult Health

## 2018-02-10 ENCOUNTER — Ambulatory Visit: Payer: Medicaid Other | Admitting: Adult Health

## 2018-02-10 VITALS — BP 118/84 | HR 78 | Ht 63.0 in | Wt 145.6 lb

## 2018-02-10 DIAGNOSIS — G441 Vascular headache, not elsewhere classified: Secondary | ICD-10-CM

## 2018-02-10 DIAGNOSIS — F1990 Other psychoactive substance use, unspecified, uncomplicated: Secondary | ICD-10-CM

## 2018-02-10 DIAGNOSIS — Z5181 Encounter for therapeutic drug level monitoring: Secondary | ICD-10-CM | POA: Diagnosis not present

## 2018-02-10 NOTE — Progress Notes (Signed)
PATIENT: Jean [name] DOB: Nov 01, 1965  REASON FOR VISIT: follow up HISTORY FROM: patient  HISTORY OF PRESENT ILLNESS: Today 02/10/18:  Jean [name] is a 52 year old female with a history of daily headaches.  She returns today for follow-up.  At the last visit she was started on Depakote.  She states that she has not been consistent with this medication.  Reports that she continues to have daily headaches.  Typically when she wakes up in the morning.  She states that she has continued taking over-the-counter medication daily such as Tylenol and ibuprofen.  When asked about illicit drug use she states that she has not used cocaine in 1 month.  When I asked if I can complete a drug screen she then states that it is been approximately 1 week and she also smokes marijuana.  She returns today for an evaluation.  HISTORY [name] is a 52 year old African-American lady who is seen today for neurological consultation following recent ER visit on 11/18/17 for headache, dizziness and some speech difficulties in the setting of significantly elevated blood pressure. History is obtained from the patient and review of electronic medical records. I personally reviewed brain imaging findings. Patient said that she ran out of her blood pressure medications for several months. She woke up on 11/18/17 complaining of dizziness and felt like she was about to pass out. She went to her OB/GYN doctor's appointment and was noted to be hypertensive with blood pressure 220/110. She also had some trouble completing sentences and speaking. She was sent to the ER for evaluation. She had also some subjective left leg and hand weakness. 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 She was seen on consultation by Dr. Malen Gauze from neurology and CT angiogram of the brain and neck which I personally reviewed showed no significant large  vessel stenosis or occlusion. MRI scan of the brain was also obtained which I have reviewed shows no acute abnormality. Patient was counseled to be compliant with blood pressure medications. Patient also complains of headache which she feels has persisted for the last 3 days. Her speech difficulties have resolved. She complains of severe bifrontal headaches more around the left eye which is constant. Is accompanied by some light sensitivity no nausea or sound sensitivity. She has tried taking hydrocodone and ibuprofen without much relief. She does have a prior history of migraine headaches usually they respond to ibuprofen. Her urine drug screen was positive for cocaine during her recent ER visit and patient states that she promises she will be clean and not abuse anymore. She denies any prior history of strokes TIAs seizures or significant neurological problems.  REVIEW OF SYSTEMS: Out of a complete 14 system review of symptoms, the patient complains only of the following symptoms, and all other reviewed systems are negative.  See HPI  ALLERGIES: Allergies  Allergen Reactions  . Lisinopril Swelling and Other (See Comments)    Lip swelling, patient reported - unable to confirm from chart     HOME MEDICATIONS: Outpatient Medications Prior to Visit  Medication Sig Dispense Refill  . albuterol (PROVENTIL HFA;VENTOLIN HFA) 108 (90 Base) MCG/ACT inhaler Inhale 2 puffs into the lungs every 6 (six) hours as needed for wheezing or shortness of breath. 18 g 1  . cyclobenzaprine (FLEXERIL) 10 MG tablet Take 1 tablet (10 mg total) by mouth 2 (two) times daily as needed  for muscle spasms. 30 tablet 3  . divalproex (DEPAKOTE ER) 500 MG 24 hr tablet Take 1 tablet (500 mg total) by mouth daily. 30 tablet 2  . gabapentin (NEURONTIN) 300 MG capsule Take 1 capsule (300 mg total) by mouth at bedtime. 90 capsule 1  . hydrochlorothiazide (HYDRODIURIL) 25 MG tablet Take 1 tablet (25 mg total) by mouth daily. 90 tablet  3  . ranitidine (ZANTAC) 150 MG tablet Take 1 tablet (150 mg total) by mouth 2 (two) times daily. 180 tablet 1  . sertraline (ZOLOFT) 50 MG tablet Take 2 tablets (100 mg total) by mouth daily. 180 tablet 1  . tetrahydrozoline 0.05 % ophthalmic solution Place 1 drop into both eyes daily as needed (dry eyes).    . ASPIRIN ADULT PO Take 1 tablet by mouth daily.    Marland Kitchen amLODipine (NORVASC) 5 MG tablet Take 1 tablet (5 mg total) by mouth daily. 90 tablet 3   No facility-administered medications prior to visit.     PAST MEDICAL HISTORY: Past Medical History:  Diagnosis Date  . Adnexal mass 2019   likely remnant fibroid on broad ligament; getting diagnostic lap  . Arthritis    hip  . Asthma   . Carpal tunnel syndrome    bilateral  . Chronic lower back pain   . Depression   . Dyspnea    with exertion  . GERD (gastroesophageal reflux disease)   . Headache    migraines  . Herpes   . Hypertension   . Liver lesion, right lobe 12/01/2015   CT chest without contrast 11/30/15 with hepatic lesions measuring 2.0 x 1.7 cm of the right lobe and 1 x 1 cm on the dome. CT abd/pel 8/18: 1.3cm inferior R liver lobe, prob benign but recommend 3-66mo f/u with MRI w/wo IV contrast with attenuation MRI 6/19: combination of benign cavernous hemangiomas and small benign cysts  . SVD (spontaneous vaginal delivery)    x 2  . Uterine leiomyoma 02/16/2016   s/p vaginal hysterectomy 2018    PAST SURGICAL HISTORY: Past Surgical History:  Procedure Laterality Date  . SALPINGECTOMY Left    lap - ectopic preg  . TOTAL HIP ARTHROPLASTY Left 01/20/2016   Procedure: LEFT TOTAL HIP ARTHROPLASTY ANTERIOR APPROACH;  Surgeon: Mcarthur Rossetti, MD;  Location: [facility];  Service: Orthopedics;  Laterality: Left;  Marland Kitchen VAGINAL HYSTERECTOMY Right 12/11/2016   Procedure: HYSTERECTOMY VAGINAL WITH RIGHT SALPINGECTOMY;  Surgeon: Chancy Milroy, MD;  Location: [facility];  Service: Gynecology;  Laterality: Right;    FAMILY  HISTORY: Family History  Problem Relation Age of Onset  . Cancer Mother   . Hypertension Mother   . Diabetes Mother   . Stroke Father     SOCIAL HISTORY: Social History   Socioeconomic History  . Marital status: Single    Spouse name: Not on file  . Number of children: Not on file  . Years of education: Not on file  . Highest education level: Not on file  Occupational History  . Not on file  Social Needs  . Financial resource strain: Not on file  . Food insecurity:    Worry: Not on file    Inability: Not on file  . Transportation needs:    Medical: Not on file    Non-medical: Not on file  Tobacco Use  . Smoking status: Light Tobacco Smoker    Packs/day: 0.25    Years: 11.00    Pack years: 2.75    Types:  Cigarettes    Last attempt to quit: 11/28/2016    Years since quitting: 1.2  . Smokeless tobacco: Never Used  Substance and Sexual Activity  . Alcohol use: Not Currently    Alcohol/week: 2.0 standard drinks    Types: 2 Cans of beer per week    Comment: stopped a week ago (11/20/17) EK,RN  . Drug use: No    Types: "Crack" cocaine, Cocaine  . Sexual activity: Yes    Partners: Male    Birth control/protection: None  Lifestyle  . Physical activity:    Days per week: Not on file    Minutes per session: Not on file  . Stress: Not on file  Relationships  . Social connections:    Talks on phone: Not on file    Gets together: Not on file    Attends religious service: Not on file    Active member of club or organization: Not on file    Attends meetings of clubs or organizations: Not on file    Relationship status: Not on file  . Intimate partner violence:    Fear of current or ex partner: Not on file    Emotionally abused: Not on file    Physically abused: Not on file    Forced sexual activity: Not on file  Other Topics Concern  . Not on file  Social History Narrative   Lives with Esmeralda Arthur   Caffeine use: soda sometimes.   Right handed        PHYSICAL EXAM  Vitals:   02/10/18 1125  BP: 118/84  Pulse: 78  Weight: 145 lb 9.6 oz (66 kg)  Height: 5\' 3"  (1.6 m)   Body mass index is 25.79 kg/m.  Generalized: Well developed, in no acute distress   Neurological examination  Mentation: Alert oriented to time, place, history taking. Follows all commands.  Mild dysarthria noted. Cranial nerve II-XII: Pupils were equal round reactive to light. Extraocular movements were full, visual field were full on confrontational test. Facial sensation and strength were normal. Uvula tongue midline. Head turning and shoulder shrug  were normal and symmetric. Motor: The motor testing reveals 5 over 5 strength of all 4 extremities. Good symmetric motor tone is noted throughout.  Sensory: Sensory testing is intact to soft touch on all 4 extremities. No evidence of extinction is noted.  Coordination: Cerebellar testing reveals good finger-nose-finger and heel-to-shin bilaterally.  Gait and station: Gait is slightly unsteady.  Tandem gait not attempted.  She uses a walker when ambulating. Reflexes: Deep tendon reflexes are symmetric and normal bilaterally.   DIAGNOSTIC DATA (LABS, IMAGING, TESTING) - I reviewed patient records, labs, notes, testing and imaging myself where available.  Lab Results  Component Value Date   WBC 7.4 11/18/2017   HGB 13.3 11/18/2017   HCT 39.0 11/18/2017   MCV 102.4 (H) 11/18/2017   PLT 371 11/18/2017      Component Value Date/Time   NA 140 11/18/2017 0934   NA 139 10/29/2016 1030   K 4.0 11/18/2017 0934   CL 109 11/18/2017 0934   CO2 22 11/18/2017 0927   GLUCOSE 87 11/18/2017 0934   BUN 26 (H) 11/18/2017 0934   BUN 21 10/29/2016 1030   CREATININE 1.40 (H) 11/18/2017 0934   CALCIUM 9.2 11/18/2017 0927   PROT 7.4 11/18/2017 0927   ALBUMIN 3.6 11/18/2017 0927   AST 14 (L) 11/18/2017 0927   ALT 11 11/18/2017 0927   ALKPHOS 75 11/18/2017 0927   BILITOT 0.5  11/18/2017 0927   GFRNONAA 40 (L)  11/18/2017 0927   GFRAA 46 (L) 11/18/2017 0927   Lab Results  Component Value Date   CHOL 230 (H) 07/08/2015   HDL 128 07/08/2015   LDLCALC 94 07/08/2015   TRIG 40 07/08/2015   CHOLHDL 1.8 07/08/2015   Lab Results  Component Value Date   HGBA1C 5.3 07/08/2015   No results found for: VITAMINB12 No results found for: TSH    ASSESSMENT AND PLAN 52 y.o. year old female  has a past medical history of Adnexal mass (2019), Arthritis, Asthma, Carpal tunnel syndrome, Chronic lower back pain, Depression, Dyspnea, GERD (gastroesophageal reflux disease), Headache, Herpes, Hypertension, Liver lesion, right lobe (12/01/2015), SVD (spontaneous vaginal delivery), and Uterine leiomyoma (02/16/2016). here with :  1.  Daily headache 2.  Illicit drug use  The patient was advised that she should take Depakote and daily and not miss any doses in order to prevent headaches.  She was again advised that she should stop taking Tylenol and ibuprofen daily as this could be causing rebound headaches.  We had a long discussion about illicit drug use.  I encouraged the patient to consider enrolling in a program to help her stop.  The patient states that she is not ready.  I did review with the patient potential risk associated with doing cocaine.  Patient voiced understanding.  I will check some blood work today.  I have advised that if her symptoms worsen or she develops new symptoms she should let us know.  She will follow-up in 6 months or sooner if needed.   [name], MSN, NP-C 02/10/2018, 11:32 AM [facility] [address], [place], [place] [address] [phone]

## 2018-02-10 NOTE — Patient Instructions (Addendum)
Your Plan:  Continue Depakote- should be taken nightly Blood work today Consider program for illicit drug use Stop Tylenol, Ibuprofen If your symptoms worsen or you develop new symptoms please let us know.   Thank you for coming to see [place] at [facility]. I hope we have been able to provide you high quality care today.  You may receive a patient satisfaction survey over the next few weeks. We would appreciate your feedback and comments so that we may continue to improve ourselves and the health of our patients.

## 2018-02-10 NOTE — Progress Notes (Signed)
I agree with the above plan 

## 2018-02-11 ENCOUNTER — Telehealth: Payer: Self-pay | Admitting: *Deleted

## 2018-02-11 ENCOUNTER — Telehealth (INDEPENDENT_AMBULATORY_CARE_PROVIDER_SITE_OTHER): Payer: Self-pay | Admitting: Physical Medicine and Rehabilitation

## 2018-02-11 LAB — CBC WITH DIFFERENTIAL/PLATELET
Basophils Absolute: 0 10*3/uL (ref 0.0–0.2)
Basos: 1 %
EOS (ABSOLUTE): 0.2 10*3/uL (ref 0.0–0.4)
EOS: 2 %
HEMATOCRIT: 39.9 % (ref 34.0–46.6)
Hemoglobin: 13.1 g/dL (ref 11.1–15.9)
IMMATURE GRANS (ABS): 0.1 10*3/uL (ref 0.0–0.1)
IMMATURE GRANULOCYTES: 1 %
LYMPHS: 38 %
Lymphocytes Absolute: 2.8 10*3/uL (ref 0.7–3.1)
MCH: 32 pg (ref 26.6–33.0)
MCHC: 32.8 g/dL (ref 31.5–35.7)
MCV: 97 fL (ref 79–97)
MONOS ABS: 0.5 10*3/uL (ref 0.1–0.9)
Monocytes: 7 %
NEUTROS PCT: 51 %
Neutrophils Absolute: 3.9 10*3/uL (ref 1.4–7.0)
Platelets: 419 10*3/uL (ref 150–450)
RBC: 4.1 x10E6/uL (ref 3.77–5.28)
RDW: 13.7 % (ref 12.3–15.4)
WBC: 7.5 10*3/uL (ref 3.4–10.8)

## 2018-02-11 LAB — COMPREHENSIVE METABOLIC PANEL
A/G RATIO: 1.3 (ref 1.2–2.2)
ALBUMIN: 4.4 g/dL (ref 3.5–5.5)
ALT: 11 IU/L (ref 0–32)
AST: 15 IU/L (ref 0–40)
Alkaline Phosphatase: 96 IU/L (ref 39–117)
BUN / CREAT RATIO: 9 (ref 9–23)
BUN: 12 mg/dL (ref 6–24)
CHLORIDE: 98 mmol/L (ref 96–106)
CO2: 26 mmol/L (ref 20–29)
Calcium: 10.2 mg/dL (ref 8.7–10.2)
Creatinine, Ser: 1.3 mg/dL — ABNORMAL HIGH (ref 0.57–1.00)
GFR calc Af Amer: 54 mL/min/{1.73_m2} — ABNORMAL LOW (ref >59)
GFR calc non Af Amer: 47 mL/min/{1.73_m2} — ABNORMAL LOW (ref >59)
GLOBULIN, TOTAL: 3.5 g/dL (ref 1.5–4.5)
Glucose: 77 mg/dL (ref 65–99)
POTASSIUM: 5.1 mmol/L (ref 3.5–5.2)
SODIUM: 140 mmol/L (ref 134–144)
Total Protein: 7.9 g/dL (ref 6.0–8.5)

## 2018-02-11 LAB — VALPROIC ACID LEVEL: Valproic Acid Lvl: <4 ug/mL — ABNORMAL LOW (ref 50–100)

## 2018-02-11 NOTE — Telephone Encounter (Signed)
Need to know how much relief she had with the 2 esi's. H/o substance use on last note from Neurology

## 2018-02-11 NOTE — Telephone Encounter (Signed)
LVM informing patient that her lab work shows that she is not taking Depakote consistently. Advised that she did admit this in the visit. Advised her that she should take Depakote consistently in order to eliminate headaches. Informed her the office is closed, but left number for any questions.

## 2018-02-12 NOTE — Telephone Encounter (Signed)
Scheduled for 03/03/18 with driver and no blood thinners.

## 2018-02-12 NOTE — Telephone Encounter (Signed)
Patient states that she still hand numbness, but she had more than 50% relief of her pain until about a month ago.

## 2018-02-12 NOTE — Telephone Encounter (Signed)
Left message for patient to call back to schedule.  °

## 2018-02-12 NOTE — Telephone Encounter (Signed)
Ok to repeat 

## 2018-02-13 NOTE — Patient Instructions (Signed)
Your procedure is scheduled on: Tuesday February 25, 2018 AT 12:00 pm  Enter through the Main Entrance of [facility] at: 10:30 am  Pick up the phone at the desk and dial [phone].  Call this number if you have problems the morning of surgery: [phone].  Remember: Do NOT eat food: after Midnight on Monday October 21 Do NOT drink clear liquids after: 6:00 am day of surgery Take these medicines the morning of surgery with a SIP OF WATER:  STOP ALL VITAMINS, SUPPLEMENTS, HERBAL MEDICATIONS, NSAIDS NOW  DO NOT SMOKE DAY OF SURGERY  BRUSH YOUR TEETH DAY OF SURGERY  Do NOT wear jewelry (body piercing), metal hair clips/bobby pins, make-up, or nail polish. Do NOT wear lotions, powders, or perfumes.  You may wear deoderant. Do NOT shave for 48 hours prior to surgery. Do NOT bring valuables to the hospital. Contacts, dentures, or bridgework may not be worn into surgery.  Have a responsible adult drive you home and stay with you for 24 hours after your procedure

## 2018-02-13 NOTE — Telephone Encounter (Signed)
Pt called stating she has been taking medication regularly since her last appt but has still had h/a wanting to know if she needs to allow the medication more time to start working please advise

## 2018-02-14 ENCOUNTER — Encounter (HOSPITAL_COMMUNITY)
Admission: RE | Admit: 2018-02-14 | Discharge: 2018-02-14 | Disposition: A | Discharge status: Home / Self Care | Payer: No Typology Code available for payment source | Source: Ambulatory Visit | Attending: Obstetrics and Gynecology | Admitting: Obstetrics and Gynecology

## 2018-02-17 ENCOUNTER — Ambulatory Visit (INDEPENDENT_AMBULATORY_CARE_PROVIDER_SITE_OTHER): Payer: Medicaid Other | Admitting: Physician Assistant

## 2018-02-17 ENCOUNTER — Encounter (INDEPENDENT_AMBULATORY_CARE_PROVIDER_SITE_OTHER): Payer: Self-pay | Admitting: Physician Assistant

## 2018-02-17 DIAGNOSIS — G8929 Other chronic pain: Secondary | ICD-10-CM

## 2018-02-17 DIAGNOSIS — M7061 Trochanteric bursitis, right hip: Secondary | ICD-10-CM

## 2018-02-17 DIAGNOSIS — M25561 Pain in right knee: Secondary | ICD-10-CM | POA: Diagnosis not present

## 2018-02-17 MED ORDER — LIDOCAINE HCL 1 % IJ SOLN
3.0000 mL | INTRAMUSCULAR | Status: AC | PRN
  Administered 2018-02-17: 3 mL

## 2018-02-17 MED ORDER — METHYLPREDNISOLONE ACETATE 40 MG/ML IJ SUSP
40.0000 mg | INTRAMUSCULAR | Status: AC | PRN
  Administered 2018-02-17: 40 mg via INTRA_ARTICULAR

## 2018-02-17 NOTE — Progress Notes (Signed)
Office Visit Note   Patient: Jean [name]           Date of Birth: 08/21/1965           MRN: [id number] Visit Date: 02/17/2018              Requested by: Alphonzo Grieve, MD [address] [place], [place] [address] PCP: Alphonzo Grieve, MD   Assessment & Plan: Visit Diagnoses:  1. Chronic pain of right knee   2. Trochanteric bursitis, right hip     Plan: We will have her do IT band stretching exercises.  See her back in 3 weeks check her response to the injections.  Follow-Up Instructions: Return in about 3 weeks (around 03/10/2018).   Orders:  Orders Placed This Encounter  Procedures  . Large Joint Inj  . Large Joint Inj   No orders of the defined types were placed in this encounter.     Procedures: Large Joint Inj: R knee on 02/17/2018 9:00 AM Indications: pain Details: 22 G 1.5 in needle, anterolateral approach  Arthrogram: No  Medications: 3 mL lidocaine 1 %; 40 mg methylPREDNISolone acetate 40 MG/ML Outcome: tolerated well, no immediate complications Procedure, treatment alternatives, risks and benefits explained, specific risks discussed. Consent was given by the patient. Immediately prior to procedure a time out was called to verify the correct patient, procedure, equipment, support staff and site/side marked as required. Patient was prepped and draped in the usual sterile fashion.   Large Joint Inj: R greater trochanter on 02/17/2018 9:00 AM Indications: pain Details: 22 G 1.5 in needle, lateral approach  Arthrogram: No  Medications: 3 mL lidocaine 1 %; 40 mg methylPREDNISolone acetate 40 MG/ML Outcome: tolerated well, no immediate complications Procedure, treatment alternatives, risks and benefits explained, specific risks discussed. Consent was given by the patient. Immediately prior to procedure a time out was called to verify the correct patient, procedure, equipment, support staff and site/side marked as required. Patient was prepped and draped in the  usual sterile fashion.       Clinical Data: No additional findings.   Subjective: Chief Complaint  Patient presents with  . Right Hip - Pain  . Right Knee - Pain    HPI Mrs. Jean [name] returns today for right hip and knee pain.  She is seen by Dr. [name] a month ago when over MRI of her knee which showed no and internal derangement of the knee only some edema of Hoffa's fat pad was seen.  She was seen earlier this year and had a hip pain was given a trochanteric injection and this helped.  She states she is having pain lateral aspect of the hip when lying on the hip.  She is walking with a rolling walker at this point time.  She feels like her leg lengths are uneven and was given a lift by physical therapy.  Denies any numbness tingling or radicular symptoms down either leg. Review of Systems  See HPI Objective: Vital Signs: LMP 12/08/2016 (Approximate)   Physical Exam  Constitutional: She is oriented to person, place, and time. She appears well-developed and well-nourished. No distress.  Pulmonary/Chest: Effort normal.  Neurological: She is alert and oriented to person, place, and time.  Skin: She is not diaphoretic.    Ortho Exam Good range of motion of bilateral hips without pain.  Tenderness over the right trochanteric region.  5 out of 5 strength throughout lower extremities negative straight leg raise bilaterally.  Tight hamstrings bilaterally.  Right knee fluid range of motion without significant pain she has tenderness along lateral joint line.  No instability valgus varus stressing. Specialty Comments:  No specialty comments available.  Imaging: No results found.   PMFS History: Patient Active Problem List   Diagnosis Date Noted  . Dyspareunia in female 09/13/2017  . Adnexal mass 07/10/2017  . Cervicalgia 04/24/2017  . CKD (chronic kidney disease) 10/29/2016  . Chest wall pain 10/15/2016  . Chronic pain syndrome 05/10/2016  . MDD (major depressive disorder)  10/19/2015  . Dyspnea on exertion 08/22/2015  . HTN (hypertension) 07/08/2015  . Routine health maintenance 07/08/2015  . Tobacco abuse 07/08/2015   Past Medical History:  Diagnosis Date  . Adnexal mass 2019   likely remnant fibroid on broad ligament; getting diagnostic lap  . Arthritis    hip  . Asthma   . Carpal tunnel syndrome    bilateral  . Chronic lower back pain   . Depression   . Dyspnea    with exertion  . GERD (gastroesophageal reflux disease)   . Headache    migraines  . Herpes   . Hypertension   . Liver lesion, right lobe 12/01/2015   CT chest without contrast 11/30/15 with hepatic lesions measuring 2.0 x 1.7 cm of the right lobe and 1 x 1 cm on the dome. CT abd/pel 8/18: 1.3cm inferior R liver lobe, prob benign but recommend 3-34mo f/u with MRI w/wo IV contrast with attenuation MRI 6/19: combination of benign cavernous hemangiomas and small benign cysts  . SVD (spontaneous vaginal delivery)    x 2  . Uterine leiomyoma 02/16/2016   s/p vaginal hysterectomy 2018    Family History  Problem Relation Age of Onset  . Cancer Mother   . Hypertension Mother   . Diabetes Mother   . Stroke Father     Past Surgical History:  Procedure Laterality Date  . SALPINGECTOMY Left    lap - ectopic preg  . TOTAL HIP ARTHROPLASTY Left 01/20/2016   Procedure: LEFT TOTAL HIP ARTHROPLASTY ANTERIOR APPROACH;  Surgeon: Mcarthur Rossetti, MD;  Location: [facility];  Service: Orthopedics;  Laterality: Left;  Marland Kitchen VAGINAL HYSTERECTOMY Right 12/11/2016   Procedure: HYSTERECTOMY VAGINAL WITH RIGHT SALPINGECTOMY;  Surgeon: Chancy Milroy, MD;  Location: [facility];  Service: Gynecology;  Laterality: Right;   Social History   Occupational History  . Not on file  Tobacco Use  . Smoking status: Light Tobacco Smoker    Packs/day: 0.25    Years: 11.00    Pack years: 2.75    Types: Cigarettes    Last attempt to quit: 11/28/2016    Years since quitting: 1.2  . Smokeless tobacco: Never Used    Substance and Sexual Activity  . Alcohol use: Not Currently    Alcohol/week: 2.0 standard drinks    Types: 2 Cans of beer per week    Comment: stopped a week ago (11/20/17) EK,RN  . Drug use: No    Types: "Crack" cocaine, Cocaine  . Sexual activity: Yes    Partners: Male    Birth control/protection: None

## 2018-02-17 NOTE — Telephone Encounter (Addendum)
Spoke with patient and inquired if she is taking Depakote as ordered. She is taking it every morning, stated she takes gabapentin every night. Some days she still wakes with a headache. She had a headache Sat morning and yesterday evening. Today she saw Dr Nelva Bush and got injections in her right hip and knee. She has injections scheduled later this month for her "upper back where her neck is". She stated Dr Nelva Bush told her the headaches may be from her "severe nerve damage in her neck, upper back".  She denies a headache today.  This RN advised she stay well hydrated, avoid Tylenol, Ibuprofen use daily per NP and avoid illicit drug use per NP. The patient stated she has been limiting OTC medicine use.  This RN advised her to continue with injections this month, taking medications as ordered, hydration and to call if headaches worsen.  Patient verbalized understanding, appreciation.

## 2018-02-17 NOTE — Progress Notes (Deleted)
Office Visit Note   Patient: VEVA GRIMLEY           Date of Birth: 1965-10-16           MRN: [id number] Visit Date: 02/17/2018              Requested by: Alphonzo Grieve, MD [address] [place], [place] [address] PCP: Alphonzo Grieve, MD   Assessment & Plan: Visit Diagnoses:  1. Chronic pain of right knee   2. Trochanteric bursitis, right hip     Plan: ***  Follow-Up Instructions: Return in about 3 weeks (around 03/10/2018).   Orders:  Orders Placed This Encounter  Procedures  . Large Joint Inj: R knee  . Large Joint Inj: R greater trochanter   No orders of the defined types were placed in this encounter.     Procedures: No procedures performed   Clinical Data: No additional findings.   Subjective: Chief Complaint  Patient presents with  . Right Hip - Pain  . Right Knee - Pain    HPI  Review of Systems   Objective: Vital Signs: LMP 12/08/2016 (Approximate)   Physical Exam  Ortho Exam  Specialty Comments:  No specialty comments available.  Imaging: No results found.   PMFS History: Patient Active Problem List   Diagnosis Date Noted  . Dyspareunia in female 09/13/2017  . Adnexal mass 07/10/2017  . Cervicalgia 04/24/2017  . CKD (chronic kidney disease) 10/29/2016  . Chest wall pain 10/15/2016  . Chronic pain syndrome 05/10/2016  . MDD (major depressive disorder) 10/19/2015  . Dyspnea on exertion 08/22/2015  . HTN (hypertension) 07/08/2015  . Routine health maintenance 07/08/2015  . Tobacco abuse 07/08/2015   Past Medical History:  Diagnosis Date  . Adnexal mass 2019   likely remnant fibroid on broad ligament; getting diagnostic lap  . Arthritis    hip  . Asthma   . Carpal tunnel syndrome    bilateral  . Chronic lower back pain   . Depression   . Dyspnea    with exertion  . GERD (gastroesophageal reflux disease)   . Headache    migraines  . Herpes   . Hypertension   . Liver lesion, right lobe 12/01/2015   CT chest  without contrast 11/30/15 with hepatic lesions measuring 2.0 x 1.7 cm of the right lobe and 1 x 1 cm on the dome. CT abd/pel 8/18: 1.3cm inferior R liver lobe, prob benign but recommend 3-26mo f/u with MRI w/wo IV contrast with attenuation MRI 6/19: combination of benign cavernous hemangiomas and small benign cysts  . SVD (spontaneous vaginal delivery)    x 2  . Uterine leiomyoma 02/16/2016   s/p vaginal hysterectomy 2018    Family History  Problem Relation Age of Onset  . Cancer Mother   . Hypertension Mother   . Diabetes Mother   . Stroke Father     Past Surgical History:  Procedure Laterality Date  . SALPINGECTOMY Left    lap - ectopic preg  . TOTAL HIP ARTHROPLASTY Left 01/20/2016   Procedure: LEFT TOTAL HIP ARTHROPLASTY ANTERIOR APPROACH;  Surgeon: Mcarthur Rossetti, MD;  Location: [facility];  Service: Orthopedics;  Laterality: Left;  Marland Kitchen VAGINAL HYSTERECTOMY Right 12/11/2016   Procedure: HYSTERECTOMY VAGINAL WITH RIGHT SALPINGECTOMY;  Surgeon: Chancy Milroy, MD;  Location: [facility];  Service: Gynecology;  Laterality: Right;   Social History   Occupational History  . Not on file  Tobacco Use  . Smoking status: Light  Tobacco Smoker    Packs/day: 0.25    Years: 11.00    Pack years: 2.75    Types: Cigarettes    Last attempt to quit: 11/28/2016    Years since quitting: 1.2  . Smokeless tobacco: Never Used  Substance and Sexual Activity  . Alcohol use: Not Currently    Alcohol/week: 2.0 standard drinks    Types: 2 Cans of beer per week    Comment: stopped a week ago (11/20/17) EK,RN  . Drug use: No    Types: "Crack" cocaine, Cocaine  . Sexual activity: Yes    Partners: Male    Birth control/protection: None

## 2018-02-17 NOTE — Progress Notes (Deleted)
Office Visit Note   Patient: Jean [name]           Date of Birth: 1966/01/26           MRN: [id number] Visit Date: 02/17/2018              Requested by: Alphonzo Grieve, MD [address] [place], [place] [address] PCP: Alphonzo Grieve, MD   Assessment & Plan: Visit Diagnoses:  1. Chronic pain of right knee   2. Trochanteric bursitis, right hip     Plan: ***  Follow-Up Instructions: Return in about 3 weeks (around 03/10/2018).   Orders:  No orders of the defined types were placed in this encounter.  No orders of the defined types were placed in this encounter.     Procedures: No procedures performed   Clinical Data: No additional findings.   Subjective: Chief Complaint  Patient presents with  . Right Hip - Pain  . Right Knee - Pain    HPI  Review of Systems   Objective: Vital Signs: LMP 12/08/2016 (Approximate)   Physical Exam  Ortho Exam  Specialty Comments:  No specialty comments available.  Imaging: No results found.   PMFS History: Patient Active Problem List   Diagnosis Date Noted  . Dyspareunia in female 09/13/2017  . Adnexal mass 07/10/2017  . Cervicalgia 04/24/2017  . CKD (chronic kidney disease) 10/29/2016  . Chest wall pain 10/15/2016  . Chronic pain syndrome 05/10/2016  . MDD (major depressive disorder) 10/19/2015  . Dyspnea on exertion 08/22/2015  . HTN (hypertension) 07/08/2015  . Routine health maintenance 07/08/2015  . Tobacco abuse 07/08/2015   Past Medical History:  Diagnosis Date  . Adnexal mass 2019   likely remnant fibroid on broad ligament; getting diagnostic lap  . Arthritis    hip  . Asthma   . Carpal tunnel syndrome    bilateral  . Chronic lower back pain   . Depression   . Dyspnea    with exertion  . GERD (gastroesophageal reflux disease)   . Headache    migraines  . Herpes   . Hypertension   . Liver lesion, right lobe 12/01/2015   CT chest without contrast 11/30/15 with hepatic lesions measuring  2.0 x 1.7 cm of the right lobe and 1 x 1 cm on the dome. CT abd/pel 8/18: 1.3cm inferior R liver lobe, prob benign but recommend 3-35mo f/u with MRI w/wo IV contrast with attenuation MRI 6/19: combination of benign cavernous hemangiomas and small benign cysts  . SVD (spontaneous vaginal delivery)    x 2  . Uterine leiomyoma 02/16/2016   s/p vaginal hysterectomy 2018    Family History  Problem Relation Age of Onset  . Cancer Mother   . Hypertension Mother   . Diabetes Mother   . Stroke Father     Past Surgical History:  Procedure Laterality Date  . SALPINGECTOMY Left    lap - ectopic preg  . TOTAL HIP ARTHROPLASTY Left 01/20/2016   Procedure: LEFT TOTAL HIP ARTHROPLASTY ANTERIOR APPROACH;  Surgeon: Mcarthur Rossetti, MD;  Location: [facility];  Service: Orthopedics;  Laterality: Left;  Marland Kitchen VAGINAL HYSTERECTOMY Right 12/11/2016   Procedure: HYSTERECTOMY VAGINAL WITH RIGHT SALPINGECTOMY;  Surgeon: Chancy Milroy, MD;  Location: [facility];  Service: Gynecology;  Laterality: Right;   Social History   Occupational History  . Not on file  Tobacco Use  . Smoking status: Light Tobacco Smoker    Packs/day: 0.25    Years:  11.00    Pack years: 2.75    Types: Cigarettes    Last attempt to quit: 11/28/2016    Years since quitting: 1.2  . Smokeless tobacco: Never Used  Substance and Sexual Activity  . Alcohol use: Not Currently    Alcohol/week: 2.0 standard drinks    Types: 2 Cans of beer per week    Comment: stopped a week ago (11/20/17) EK,RN  . Drug use: No    Types: "Crack" cocaine, Cocaine  . Sexual activity: Yes    Partners: Male    Birth control/protection: None

## 2018-02-19 NOTE — Patient Instructions (Addendum)
Your procedure is scheduled on: Tuesday, 10/22  Enter through the Main Entrance of [facility] at: 10:30 am  Pick up the phone at the desk and dial [phone].  Call this number if you have problems the morning of surgery: [phone].  Remember: Do NOT eat food or Do NOT drink clear liquids (including water) after midnight Monday.  Take these medicines the morning of surgery with a SIP OF WATER: amlodipine, depakote, zantac, zoloft.  Ok to use eye drops if needed.  Brush your teeth on the day of surgery.  Bring albuterol inhaler with you on day of surgery.  Do Not smoke on the day of surgery.  Stop herbal medications, vitamin supplements, Ibuprofen/NSAIDS at this time.  Do NOT wear jewelry (body piercing), metal hair clips/bobby pins, make-up, or nail polish. Do NOT wear lotions, powders, or perfumes.  You may wear deoderant. Do NOT shave for 48 hours prior to surgery. Do NOT bring valuables to the hospital.   Leave suitcase in car.  After surgery it may be brought to your room.  For patients admitted to the hospital, checkout time is 11:00 AM the day of discharge. Have a responsible adult drive you home and stay with you for 24 hours after your procedure. Home with [facility]y [facility] cell [phone].

## 2018-02-21 ENCOUNTER — Encounter (HOSPITAL_COMMUNITY)
Admission: RE | Admit: 2018-02-21 | Discharge: 2018-02-21 | Disposition: A | Discharge status: Home / Self Care | Payer: Medicaid Other | Source: Ambulatory Visit | Attending: Obstetrics and Gynecology | Admitting: Obstetrics and Gynecology

## 2018-02-21 ENCOUNTER — Other Ambulatory Visit: Payer: Self-pay

## 2018-02-21 ENCOUNTER — Encounter (HOSPITAL_COMMUNITY): Payer: Self-pay

## 2018-02-21 DIAGNOSIS — Z01812 Encounter for preprocedural laboratory examination: Secondary | ICD-10-CM | POA: Diagnosis present

## 2018-02-21 HISTORY — DX: Other specified personal risk factors, not elsewhere classified: Z91.89

## 2018-02-21 HISTORY — DX: Anxiety disorder, unspecified: F41.9

## 2018-02-21 HISTORY — DX: Other psychoactive substance abuse, uncomplicated: F19.10

## 2018-02-21 HISTORY — DX: Dependence on other enabling machines and devices: Z99.89

## 2018-02-21 LAB — CBC
HCT: 38.8 % (ref 36.0–46.0)
Hemoglobin: 12.7 g/dL (ref 12.0–15.0)
MCH: 32.2 pg (ref 26.0–34.0)
MCHC: 32.7 g/dL (ref 30.0–36.0)
MCV: 98.5 fL (ref 80.0–100.0)
NRBC: 0 % (ref 0.0–0.2)
PLATELETS: 378 10*3/uL (ref 150–400)
RBC: 3.94 MIL/uL (ref 3.87–5.11)
RDW: 13.4 % (ref 11.5–15.5)
WBC: 11 10*3/uL — AB (ref 4.0–10.5)

## 2018-02-21 LAB — BASIC METABOLIC PANEL
ANION GAP: 6 (ref 5–15)
BUN: 19 mg/dL (ref 6–20)
CALCIUM: 9.1 mg/dL (ref 8.9–10.3)
CO2: 26 mmol/L (ref 22–32)
Chloride: 104 mmol/L (ref 98–111)
Creatinine, Ser: 1.24 mg/dL — ABNORMAL HIGH (ref 0.44–1.00)
GFR calc non Af Amer: 48 mL/min — ABNORMAL LOW (ref >60)
GFR, EST AFRICAN AMERICAN: 56 mL/min — AB (ref >60)
Glucose, Bld: 92 mg/dL (ref 70–99)
Potassium: 4.3 mmol/L (ref 3.5–5.1)
SODIUM: 136 mmol/L (ref 135–145)

## 2018-02-21 LAB — SURGICAL PCR SCREEN
MRSA, PCR: NEGATIVE
STAPHYLOCOCCUS AUREUS: NEGATIVE

## 2018-02-25 ENCOUNTER — Ambulatory Visit (HOSPITAL_COMMUNITY): Payer: Medicaid Other | Admitting: Anesthesiology

## 2018-02-25 ENCOUNTER — Other Ambulatory Visit: Payer: Self-pay

## 2018-02-25 ENCOUNTER — Encounter (HOSPITAL_COMMUNITY): Payer: Self-pay | Admitting: Anesthesiology

## 2018-02-25 ENCOUNTER — Encounter (HOSPITAL_COMMUNITY)
Admission: RE | Disposition: A | Discharge status: Home / Self Care | Payer: Self-pay | Source: Ambulatory Visit | Attending: Obstetrics and Gynecology

## 2018-02-25 ENCOUNTER — Ambulatory Visit (HOSPITAL_COMMUNITY)
Admission: RE | Admit: 2018-02-25 | Discharge: 2018-02-25 | Disposition: A | Discharge status: Home / Self Care | Payer: Medicaid Other | Source: Ambulatory Visit | Attending: Obstetrics and Gynecology | Admitting: Obstetrics and Gynecology

## 2018-02-25 DIAGNOSIS — Z96642 Presence of left artificial hip joint: Secondary | ICD-10-CM | POA: Insufficient documentation

## 2018-02-25 DIAGNOSIS — F17210 Nicotine dependence, cigarettes, uncomplicated: Secondary | ICD-10-CM | POA: Insufficient documentation

## 2018-02-25 DIAGNOSIS — G43909 Migraine, unspecified, not intractable, without status migrainosus: Secondary | ICD-10-CM | POA: Insufficient documentation

## 2018-02-25 DIAGNOSIS — I10 Essential (primary) hypertension: Secondary | ICD-10-CM | POA: Diagnosis not present

## 2018-02-25 DIAGNOSIS — J45909 Unspecified asthma, uncomplicated: Secondary | ICD-10-CM | POA: Diagnosis not present

## 2018-02-25 DIAGNOSIS — K219 Gastro-esophageal reflux disease without esophagitis: Secondary | ICD-10-CM | POA: Insufficient documentation

## 2018-02-25 DIAGNOSIS — Z79899 Other long term (current) drug therapy: Secondary | ICD-10-CM | POA: Diagnosis not present

## 2018-02-25 DIAGNOSIS — R1909 Other intra-abdominal and pelvic swelling, mass and lump: Secondary | ICD-10-CM

## 2018-02-25 DIAGNOSIS — F329 Major depressive disorder, single episode, unspecified: Secondary | ICD-10-CM | POA: Diagnosis not present

## 2018-02-25 DIAGNOSIS — F419 Anxiety disorder, unspecified: Secondary | ICD-10-CM | POA: Diagnosis not present

## 2018-02-25 HISTORY — PX: LAPAROSCOPIC OVARIAN CYSTECTOMY: SHX6248

## 2018-02-25 SURGERY — EXCISION, CYST, OVARY, LAPAROSCOPIC
Anesthesia: General | Site: Abdomen | Laterality: Right

## 2018-02-25 MED ORDER — FENTANYL CITRATE (PF) 100 MCG/2ML IJ SOLN
INTRAMUSCULAR | Status: DC | PRN
  Administered 2018-02-25: 50 ug via INTRAVENOUS
  Administered 2018-02-25 (×2): 25 ug via INTRAVENOUS
  Administered 2018-02-25: 50 ug via INTRAVENOUS
  Administered 2018-02-25 (×2): 25 ug via INTRAVENOUS

## 2018-02-25 MED ORDER — PROPOFOL 10 MG/ML IV BOLUS
INTRAVENOUS | Status: AC
  Filled 2018-02-25: qty 40

## 2018-02-25 MED ORDER — LIDOCAINE HCL (CARDIAC) PF 100 MG/5ML IV SOSY
PREFILLED_SYRINGE | INTRAVENOUS | Status: DC | PRN
  Administered 2018-02-25: 60 mg via INTRAVENOUS

## 2018-02-25 MED ORDER — OXYCODONE HCL 5 MG PO TABS
ORAL_TABLET | ORAL | Status: AC
  Filled 2018-02-25: qty 1

## 2018-02-25 MED ORDER — FENTANYL CITRATE (PF) 100 MCG/2ML IJ SOLN
25.0000 ug | INTRAMUSCULAR | Status: DC | PRN
  Administered 2018-02-25 (×2): 25 ug via INTRAVENOUS
  Administered 2018-02-25: 50 ug via INTRAVENOUS

## 2018-02-25 MED ORDER — KETOROLAC TROMETHAMINE 30 MG/ML IJ SOLN
INTRAMUSCULAR | Status: AC
  Filled 2018-02-25: qty 1

## 2018-02-25 MED ORDER — CEFAZOLIN SODIUM-DEXTROSE 2-4 GM/100ML-% IV SOLN
INTRAVENOUS | Status: AC
  Filled 2018-02-25: qty 100

## 2018-02-25 MED ORDER — MIDAZOLAM HCL 2 MG/2ML IJ SOLN
INTRAMUSCULAR | Status: DC | PRN
  Administered 2018-02-25: 2 mg via INTRAVENOUS

## 2018-02-25 MED ORDER — ONDANSETRON HCL 4 MG/2ML IJ SOLN
INTRAMUSCULAR | Status: DC | PRN
  Administered 2018-02-25: 4 mg via INTRAVENOUS

## 2018-02-25 MED ORDER — SCOPOLAMINE 1 MG/3DAYS TD PT72
MEDICATED_PATCH | TRANSDERMAL | Status: AC
  Filled 2018-02-25: qty 1

## 2018-02-25 MED ORDER — SOD CITRATE-CITRIC ACID 500-334 MG/5ML PO SOLN
30.0000 mL | ORAL | Status: AC
  Administered 2018-02-25: 30 mL via ORAL

## 2018-02-25 MED ORDER — FENTANYL CITRATE (PF) 250 MCG/5ML IJ SOLN
INTRAMUSCULAR | Status: AC
  Filled 2018-02-25: qty 5

## 2018-02-25 MED ORDER — ONDANSETRON HCL 4 MG/2ML IJ SOLN
INTRAMUSCULAR | Status: AC
  Filled 2018-02-25: qty 2

## 2018-02-25 MED ORDER — ROCURONIUM BROMIDE 100 MG/10ML IV SOLN
INTRAVENOUS | Status: AC
  Filled 2018-02-25: qty 1

## 2018-02-25 MED ORDER — LACTATED RINGERS IV SOLN
INTRAVENOUS | Status: DC
  Administered 2018-02-25 (×3): via INTRAVENOUS

## 2018-02-25 MED ORDER — SUGAMMADEX SODIUM 200 MG/2ML IV SOLN
INTRAVENOUS | Status: DC | PRN
  Administered 2018-02-25: 130 mg via INTRAVENOUS

## 2018-02-25 MED ORDER — MIDAZOLAM HCL 2 MG/2ML IJ SOLN
INTRAMUSCULAR | Status: AC
  Filled 2018-02-25: qty 2

## 2018-02-25 MED ORDER — LACTATED RINGERS IV SOLN: INTRAVENOUS | Status: DC

## 2018-02-25 MED ORDER — BUPIVACAINE HCL 0.5 % IJ SOLN
INTRAMUSCULAR | Status: DC | PRN
  Administered 2018-02-25: 17 mL

## 2018-02-25 MED ORDER — SCOPOLAMINE 1 MG/3DAYS TD PT72
1.0000 | MEDICATED_PATCH | Freq: Once | TRANSDERMAL | Status: DC
  Administered 2018-02-25: 1.5 mg via TRANSDERMAL

## 2018-02-25 MED ORDER — DEXAMETHASONE SODIUM PHOSPHATE 10 MG/ML IJ SOLN
INTRAMUSCULAR | Status: AC
  Filled 2018-02-25: qty 1

## 2018-02-25 MED ORDER — MEPERIDINE HCL 25 MG/ML IJ SOLN: 6.2500 mg | INTRAMUSCULAR | Status: DC | PRN

## 2018-02-25 MED ORDER — HYDROCODONE-ACETAMINOPHEN 5-325 MG PO TABS: 1.0000 | ORAL_TABLET | Freq: Four times a day (QID) | ORAL | 0 refills | Status: DC | PRN

## 2018-02-25 MED ORDER — FENTANYL CITRATE (PF) 100 MCG/2ML IJ SOLN
INTRAMUSCULAR | Status: AC
  Filled 2018-02-25: qty 2

## 2018-02-25 MED ORDER — PROPOFOL 10 MG/ML IV BOLUS
INTRAVENOUS | Status: DC | PRN
  Administered 2018-02-25: 120 mg via INTRAVENOUS

## 2018-02-25 MED ORDER — OXYCODONE HCL 5 MG/5ML PO SOLN: 5.0000 mg | Freq: Once | ORAL | Status: AC | PRN

## 2018-02-25 MED ORDER — KETOROLAC TROMETHAMINE 30 MG/ML IJ SOLN
INTRAMUSCULAR | Status: DC | PRN
  Administered 2018-02-25: 15 mg via INTRAVENOUS

## 2018-02-25 MED ORDER — DEXAMETHASONE SODIUM PHOSPHATE 10 MG/ML IJ SOLN
INTRAMUSCULAR | Status: DC | PRN
  Administered 2018-02-25: 8 mg via INTRAVENOUS

## 2018-02-25 MED ORDER — ROCURONIUM BROMIDE 100 MG/10ML IV SOLN
INTRAVENOUS | Status: DC | PRN
  Administered 2018-02-25: 10 mg via INTRAVENOUS
  Administered 2018-02-25: 30 mg via INTRAVENOUS
  Administered 2018-02-25: 5 mg via INTRAVENOUS

## 2018-02-25 MED ORDER — SOD CITRATE-CITRIC ACID 500-334 MG/5ML PO SOLN
ORAL | Status: AC
  Filled 2018-02-25: qty 15

## 2018-02-25 MED ORDER — BUPIVACAINE HCL (PF) 0.5 % IJ SOLN
INTRAMUSCULAR | Status: AC
  Filled 2018-02-25: qty 30

## 2018-02-25 MED ORDER — SUGAMMADEX SODIUM 200 MG/2ML IV SOLN
INTRAVENOUS | Status: AC
  Filled 2018-02-25: qty 2

## 2018-02-25 MED ORDER — CEFAZOLIN SODIUM-DEXTROSE 2-4 GM/100ML-% IV SOLN
2.0000 g | INTRAVENOUS | Status: AC
  Administered 2018-02-25: 2 g via INTRAVENOUS

## 2018-02-25 MED ORDER — GLYCOPYRROLATE 0.2 MG/ML IJ SOLN
INTRAMUSCULAR | Status: DC | PRN
  Administered 2018-02-25: 0.1 mg via INTRAVENOUS

## 2018-02-25 MED ORDER — LIDOCAINE HCL (CARDIAC) PF 100 MG/5ML IV SOSY
PREFILLED_SYRINGE | INTRAVENOUS | Status: AC
  Filled 2018-02-25: qty 5

## 2018-02-25 MED ORDER — OXYCODONE HCL 5 MG PO TABS
5.0000 mg | ORAL_TABLET | Freq: Once | ORAL | Status: AC | PRN
  Administered 2018-02-25: 5 mg via ORAL

## 2018-02-25 MED ORDER — METOCLOPRAMIDE HCL 5 MG/ML IJ SOLN: 10.0000 mg | Freq: Once | INTRAMUSCULAR | Status: DC | PRN

## 2018-02-25 SURGICAL SUPPLY — 36 items
ADH SKN CLS APL DERMABOND .7 (GAUZE/BANDAGES/DRESSINGS) ×1
APL SRG 38 LTWT LNG FL B (MISCELLANEOUS)
APPLICATOR ARISTA FLEXITIP XL (MISCELLANEOUS) IMPLANT
BAG SPEC RTRVL LRG 6X4 10 (ENDOMECHANICALS) ×1
DERMABOND ADVANCED (GAUZE/BANDAGES/DRESSINGS) ×2
DERMABOND ADVANCED .7 DNX12 (GAUZE/BANDAGES/DRESSINGS) ×1 IMPLANT
DRSG OPSITE POSTOP 3X4 (GAUZE/BANDAGES/DRESSINGS) ×3 IMPLANT
DURAPREP 26ML APPLICATOR (WOUND CARE) ×3 IMPLANT
GLOVE BIO SURGEON STRL SZ7.5 (GLOVE) ×3 IMPLANT
GLOVE BIOGEL PI IND STRL 7.0 (GLOVE) ×2 IMPLANT
GLOVE BIOGEL PI IND STRL 8 (GLOVE) ×1 IMPLANT
GLOVE BIOGEL PI INDICATOR 7.0 (GLOVE) ×4
GLOVE BIOGEL PI INDICATOR 8 (GLOVE) ×2
GOWN STRL REUS W/TWL LRG LVL3 (GOWN DISPOSABLE) ×3 IMPLANT
GOWN STRL REUS W/TWL XL LVL3 (GOWN DISPOSABLE) ×3 IMPLANT
HEMOSTAT ARISTA ABSORB 3G PWDR (MISCELLANEOUS) IMPLANT
NS IRRIG 1000ML POUR BTL (IV SOLUTION) ×3 IMPLANT
PACK LAPAROSCOPY BASIN (CUSTOM PROCEDURE TRAY) ×3 IMPLANT
PACK TRENDGUARD 450 HYBRID PRO (MISCELLANEOUS) IMPLANT
POUCH SPECIMEN RETRIEVAL 10MM (ENDOMECHANICALS) ×3 IMPLANT
PROTECTOR NERVE ULNAR (MISCELLANEOUS) ×6 IMPLANT
SET IRRIG TUBING LAPAROSCOPIC (IRRIGATION / IRRIGATOR) IMPLANT
SHEARS HARMONIC ACE PLUS 36CM (ENDOMECHANICALS) ×2 IMPLANT
SLEEVE XCEL OPT CAN 5 100 (ENDOMECHANICALS) ×3 IMPLANT
SUT VICRYL 0 ENDOLOOP (SUTURE) IMPLANT
SUT VICRYL 0 UR6 27IN ABS (SUTURE) ×9 IMPLANT
SUT VICRYL 4-0 PS2 18IN ABS (SUTURE) IMPLANT
TOWEL OR 17X24 6PK STRL BLUE (TOWEL DISPOSABLE) ×6 IMPLANT
TRAY FOLEY W/BAG SLVR 14FR (SET/KITS/TRAYS/PACK) ×3 IMPLANT
TRENDGUARD 450 HYBRID PRO PACK (MISCELLANEOUS)
TROCAR BALLN 12MMX100 BLUNT (TROCAR) ×6 IMPLANT
TROCAR VERSASTEP PLUS 12MM (TROCAR) IMPLANT
TROCAR XCEL 12X100 BLDLESS (ENDOMECHANICALS) IMPLANT
TROCAR XCEL NON-BLD 5MMX100MML (ENDOMECHANICALS) ×3 IMPLANT
TUBING INSUF HEATED (TUBING) ×3 IMPLANT
WARMER LAPAROSCOPE (MISCELLANEOUS) ×3 IMPLANT

## 2018-02-25 NOTE — Op Note (Signed)
[name] PROCEDURE DATE: 02/25/2018  PREOPERATIVE DIAGNOSIS: Adnexal mass POSTOPERATIVE DIAGNOSIS:  PROCEDURE: Diagnostic laparoscopy, excision of right adnexal mass, probable broad ligament fibroid SURGEON:  Dr. Arlina Robes ASSISTANT: Dr. Aletha Halim  INDICATIONS: 52 y.o. [id number] with adnexal mass suspicious for broad ligament fibroid desiring surgical evaluation.   Please see preoperative notes for further details.  Risks of surgery were discussed with the patient including but not limited to: bleeding which may require transfusion or reoperation; infection which may require antibiotics; injury to bowel, bladder, ureters or other surrounding organs; need for additional procedures including laparotomy; thromboembolic phenomenon, incisional problems and other postoperative/anesthesia complications. Written informed consent was obtained.    FINDINGS:  Uterus absent, normal appearing ovaries bilaterally. 6 x 4 x  4 cm solid mass in right broad ligament.    ANESTHESIA:    General INTRAVENOUS FLUIDS: 2000 ml ESTIMATED BLOOD LOSS: 50 ml URINE OUTPUT: 75 ml SPECIMENS: adnexal mass COMPLICATIONS: None immediate  PROCEDURE IN DETAIL:  The patient had sequential compression devices applied to her lower extremities while in the preoperative area.  She was then taken to the operating room where general anesthesia was administered and was found to be adequate.  She was placed in the dorsal lithotomy position, and was prepped and draped in a sterile manner. Pelvic exam noted nl EGBUS, surgerical absent uterus, slight fullness in right adnexa.  A Foley catheter was inserted into her bladder and attached to constant drainage and a sponge stick was placed in the vagina to the vaginal cuff.  .  After an adequate timeout was performed, attention was turned to the abdomen where an umbilical incision was made with the scalpel.  The fascia was identified and grasped and cut. Conners were anchored with  2/0 Vicryl. Perineum was identified, grasped with pick ups and cut. A 10 mm Huson trocar was the placed and secured. .  The abdomen was then insufflated with carbon dioxide gas and adequate pneumoperitoneum was obtained.   A detailed survey of the patient's pelvis and abdomen revealed the findings as mentioned above. Two additional 5 mm trocars were placed under direct visualization in the right and left lower quadrants.   The mass as noted above was grasped and elevated to allow visualization of the right uter by the presence of periostalis. It course was tracked into the deep pelvis, demonstrating a course far away from the adnexal mass in question.  The mass was very mobile and had a small base. Harmonic scalpel was used to excise the mass from its base and the surrounding pelvic side wall.  Once free the mass was placed in an Endopouch and elevated to the umbilical incision site.  The mass was then morcellated in the pouch until it was completely removed from the pouch. The operative site was surveyed, and it was found to be hemostatic.  No intraoperative injury to surrounding organs was noted.  The abdomen was desufflated and all instruments were then removed from the patient's abdomen. The sponge stick was removed without complications.  The umbilical fasica was closed with 2/0 Vicryl. Skin was closed in a subcutrilar fashion and the lower quadrant ports were secured with Dermabond.  The patient tolerated the procedures well.  All instruments, needles, and sponge counts were correct x 2. The patient was taken to the recovery room in stable condition.    An experienced assistant was required given the standard of surgical care given the complexity of the case.  This assistant was needed for  exposure, dissection, suctioning, retraction, instrument exchange, and for overall help during the procedure.  Arlina Robes, MD, Foley Attending [place], [facility]

## 2018-02-25 NOTE — Anesthesia Postprocedure Evaluation (Signed)
Anesthesia Post Note  Patient: Jean [name]  Procedure(s) Performed: LAPAROSCOPIC REMOVAL OF RIGHT ADNEXAL MASS (Right Abdomen)     Patient location during evaluation: PACU Anesthesia Type: General Level of consciousness: awake and alert Pain management: pain level controlled Vital Signs Assessment: post-procedure vital signs reviewed and stable Respiratory status: spontaneous breathing, nonlabored ventilation, respiratory function stable and patient connected to nasal cannula oxygen Cardiovascular status: blood pressure returned to baseline and stable Postop Assessment: no apparent nausea or vomiting Anesthetic complications: no    Last Vitals:  Vitals:   02/25/18 1515 02/25/18 1530  BP: 108/72 104/66  Pulse: 65 61  Resp: 14 12  Temp:    SpO2: 93% 92%    Last Pain:  Vitals:   02/25/18 1520  TempSrc:   PainSc: Asleep   Pain Goal: Patients Stated Pain Goal: 3 (02/25/18 1026)               Montez Hageman

## 2018-02-25 NOTE — Transfer of Care (Signed)
Immediate Anesthesia Transfer of Care Note  Patient: Jean [name]  Procedure(s) Performed: LAPAROSCOPIC REMOVAL OF RIGHT ADNEXAL MASS (Right Abdomen)  Patient Location: PACU  Anesthesia Type:General  Level of Consciousness: awake and patient cooperative  Airway & Oxygen Therapy: Patient Spontanous Breathing and Patient connected to nasal cannula oxygen  Post-op Assessment: Report given to RN and Post -op Vital signs reviewed and stable  Post vital signs: Reviewed and stable  Last Vitals:  Vitals Value Taken Time  BP 117/69 02/25/2018  1:56 PM  Temp    Pulse 70 02/25/2018  1:59 PM  Resp 12 02/25/2018  1:59 PM  SpO2 97 % 02/25/2018  1:59 PM  Vitals shown include unvalidated device data.  Last Pain:  Vitals:   02/25/18 1026  TempSrc: Oral  PainSc: 0-No pain      Patients Stated Pain Goal: 3 (79/48/01 6553)  Complications: No apparent anesthesia complications

## 2018-02-25 NOTE — Anesthesia Preprocedure Evaluation (Signed)
Anesthesia Evaluation  Patient identified by MRN, date of birth, ID band Patient awake    Reviewed: Allergy & Precautions, NPO status , Patient's Chart, lab work & pertinent test results  Airway Mallampati: II  TM Distance: >3 FB Neck ROM: Full    Dental no notable dental hx. (+) Poor Dentition, Chipped, Missing   Pulmonary asthma , Current Smoker,    Pulmonary exam normal breath sounds clear to auscultation       Cardiovascular hypertension, Pt. on medications negative cardio ROS Normal cardiovascular exam Rhythm:Regular Rate:Normal     Neuro/Psych negative neurological ROS  negative psych ROS   GI/Hepatic negative GI ROS, (+)     substance abuse  cocaine use,   Endo/Other  negative endocrine ROS  Renal/GU negative Renal ROS  negative genitourinary   Musculoskeletal negative musculoskeletal ROS (+)   Abdominal   Peds negative pediatric ROS (+)  Hematology negative hematology ROS (+)   Anesthesia Other Findings   Reproductive/Obstetrics negative OB ROS                             Anesthesia Physical Anesthesia Plan  ASA: II  Anesthesia Plan: General   Post-op Pain Management:    Induction: Intravenous  PONV Risk Score and Plan: 2 and Ondansetron and Treatment may vary due to age or medical condition  Airway Management Planned: LMA  Additional Equipment:   Intra-op Plan:   Post-operative Plan:   Informed Consent: I have reviewed the patients History and Physical, chart, labs and discussed the procedure including the risks, benefits and alternatives for the proposed anesthesia with the patient or authorized representative who has indicated his/her understanding and acceptance.   Dental advisory given  Plan Discussed with: CRNA  Anesthesia Plan Comments:         Anesthesia Quick Evaluation

## 2018-02-25 NOTE — Discharge Instructions (Signed)
Laparoscopic Surgery - Care After Laparoscopy is a surgical procedure. It is used to diagnose and treat diseases inside the belly(abdomen). It is usually a brief, common, and relatively simple procedure. The laparoscopeis a thin, lighted, pencil-sized instrument. It is like a telescope. It is inserted into your abdomen through a small cut (incision). Your caregiver can look at the organs inside your body through this instrument.  She can see if there is anything abnormal. Laparoscopy can be done either in a hospital or outpatient clinic. You may be given a mild sedative to help you relax before the procedure. Once in the operating room, you will be given a drug to make you sleep (general anesthesia). Laparoscopy usually lasts about 1 hour. After the procedure, you will be monitored in a recovery area until you are stable and doing well. Once you are home, it may take 3 to 7 days to fully recover.  Laparoscopy has relatively few risks. Your caregiver will discuss the risks with you before the procedure. Some problems that can occur include: RISKS AND COMPLICATIONS  Allergies to medicines. Difficulty breathing. Bleeding. Infection. Damage to other surrounding structures HOME CARE INSTRUCTIONS  Infection. Bleeding. Damage to other organs. Anesthetic side effects.  Need for additional procedures such as open procedures/laparotomy PROCEDURE Once you receive anesthesia, your surgeon inflates the abdomen with a harmless gas (carbon dioxide). This makes the organs easier to see. The laparoscope is inserted into the abdomen through a small incision. This allows your surgeon to see into the abdomen. Other small instruments are also inserted into the abdomen through other small openings. Many surgeons attach a video camera to the laparoscope to enlarge the view. During a laparoscopy, the surgeon may be looking for inflammation, infection, or cancer.  The surgeon may also need to take out certain organs or  take tissue samples (biopsies). The specimens are sent to a specialist in looking at cells and tissue samples (pathologist). The pathologist examines them under a microscope to help to diagnose or confirm a disease. AFTER THE PROCEDURE  The incisions are closed with stitches (sutures) and Dermabond. Because these incisions are small (usually less than 1/2 inch), there is usually minimal discomfort after the procedure. There may also be discomfort from the instrument placement incisions in the abdomen. You will be given pain medicine to ease any discomfort. You will rest in a recovery room for 1-2 hours until you are stable and doing well. You may have some mild discomfort in the throat. This is from the tube placed in your throat while you were sleeping. You may experience discomfort in the shoulder area from some trapped air between the liver and diaphragm. This sensation is normal and will slowly go away on its own. The recovery time is shortened as long as there are no complications. You will rest in a recovery room until stable and doing well. As long as there are no complications, you may be allowed to go home. Someone will need to drive you home and be with you for at least 24 hours once home. FINDING OUT THE RESULTS You will be called with the results of the pathology and will discuss these results with  your caregiver during your postoperative appointment. Do not assume everything is normal if you have not heard from your caregiver or the medical facility. It is important for you to follow up on all of your results. HOME CARE INSTRUCTIONS  Take all medicines as directed. Only take over-the-counter or prescription medicines for pain, discomfort,   CARE INSTRUCTIONS   Take all medicines as directed.  Only take over-the-counter or prescription medicines for pain, discomfort, or fever as directed by your caregiver.  Resume daily activities as directed.  Showers are preferred over baths.  You may resume sexual activities in 1 week or as directed.  Do not drive while taking  narcotics. SEEK MEDICAL CARE IF:  There is increasing abdominal pain.  You feel lightheaded or faint.  You have the chills.  You have an oral temperature above 102 F (38.9 C).  There is pus-like (purulent) drainage from any of the wounds.  You are unable to pass gas or have a bowel movement.  You feel sick to your stomach (nauseous) or throw up (vomit). MAKE SURE YOU:   Understand these instructions.  Will watch your condition.  Will get help right away if you are not doing well or get worse.  ExitCare Patient Information 2013 [employer]   Post Anesthesia Home Care Instructions  Activity: Get plenty of rest for the remainder of the day. A responsible individual must stay with you for 24 hours following the procedure.  For the next 24 hours, DO NOT: -Drive a car -Operate machinery -Drink alcoholic beverages -Take any medication unless instructed by your physician -Make any legal decisions or sign important papers.  Meals: Start with liquid foods such as gelatin or soup. Progress to regular foods as tolerated. Avoid greasy, spicy, heavy foods. If nausea and/or vomiting occur, drink only clear liquids until the nausea and/or vomiting subsides. Call your physician if vomiting continues.  Special Instructions/Symptoms: Your throat may feel dry or sore from the anesthesia or the breathing tube placed in your throat during surgery. If this causes discomfort, gargle with warm salt water. The discomfort should disappear within 24 hours.  If you had a scopolamine patch placed behind your ear for the management of post- operative nausea and/or vomiting:  1. The medication in the patch is effective for 72 hours, after which it should be removed.  Wrap patch in a tissue and discard in the trash. Wash hands thoroughly with soap and water. 2. You may remove the patch earlier than 72 hours if you experience unpleasant side effects which may include dry mouth, dizziness or visual  disturbances. 3. Avoid touching the patch. Wash your hands with soap and water after contact with the patch. 

## 2018-02-25 NOTE — Anesthesia Procedure Notes (Signed)
Procedure Name: Intubation Date/Time: 02/25/2018 12:10 PM Performed by: Georgeanne Nim, CRNA Pre-anesthesia Checklist: Patient identified, Emergency Drugs available, Suction available, Patient being monitored and Timeout performed Patient Re-evaluated:Patient Re-evaluated prior to induction Oxygen Delivery Method: Circle system utilized Preoxygenation: Pre-oxygenation with 100% oxygen Induction Type: IV induction Ventilation: Mask ventilation without difficulty Laryngoscope Size: Mac and 4 Grade View: Grade I Tube type: Oral Tube size: 7.0 mm Number of attempts: 1 Airway Equipment and Method: Stylet Placement Confirmation: ETT inserted through vocal cords under direct vision,  positive ETCO2,  CO2 detector and breath sounds checked- equal and bilateral Secured at: 20 cm Tube secured with: Tape

## 2018-02-25 NOTE — H&P (Signed)
Jean [name] is an 52 y.o. female s/p TVH and Right salpingectomy in August 2018 for uterine fibroids.  Did not show for post op appt. Developed some pelvic earlier this year. U/S revealed possible broad ligament fibroid. F/U U/S confirmed. Right side measuring 6 x 4 x 4 cm. Surgerical removal has been recommended to pt. Laparoscopic removal. Has been reviewed, R/B and post op care    Menstrual History: Menarche age: 4 No LMP recorded (lmp unknown). Patient has had a hysterectomy.    Past Medical History:  Diagnosis Date  . Adnexal mass 2019   likely remnant fibroid on broad ligament; getting diagnostic lap  . Anxiety   . Arthritis    hip  . Asthma   . Carpal tunnel syndrome    bilateral  . Chronic lower back pain   . Depression   . Dyspnea    with exertion  . GERD (gastroesophageal reflux disease)   . Headache    migraines  . Herpes   . Hypertension   . Liver lesion, right lobe 12/01/2015   CT chest without contrast 11/30/15 with hepatic lesions measuring 2.0 x 1.7 cm of the right lobe and 1 x 1 cm on the dome. CT abd/pel 8/18: 1.3cm inferior R liver lobe, prob benign but recommend 3-35mo f/u with MRI w/wo IV contrast with attenuation MRI 6/19: combination of benign cavernous hemangiomas and small benign cysts  . Poor dental hygiene    chipped upper front and several loose teeth  . Substance abuse ([place])    cocaine/crack cocaine - last use 02/07/18  . SVD (spontaneous vaginal delivery)    x 1  . Uses walker    for ambulation  . Uterine leiomyoma 02/16/2016   s/p vaginal hysterectomy 2018    Past Surgical History:  Procedure Laterality Date  . SALPINGECTOMY Left    lap - ectopic preg  . TOTAL HIP ARTHROPLASTY Left 01/20/2016   Procedure: LEFT TOTAL HIP ARTHROPLASTY ANTERIOR APPROACH;  Surgeon: Mcarthur Rossetti, MD;  Location: [facility];  Service: Orthopedics;  Laterality: Left;  . TUBAL LIGATION    . VAGINAL HYSTERECTOMY Right 12/11/2016   Procedure: HYSTERECTOMY  VAGINAL WITH RIGHT SALPINGECTOMY;  Surgeon: Chancy Milroy, MD;  Location: [facility];  Service: Gynecology;  Laterality: Right;    Family History  Problem Relation Age of Onset  . Cancer Mother   . Hypertension Mother   . Diabetes Mother   . Stroke Father     Social History:  reports that she has been smoking cigarettes. She has a 2.75 pack-year smoking history. She has never used smokeless tobacco. She reports that she drank about 3.0 standard drinks of alcohol per week. She reports that she does not use drugs.  Allergies:  Allergies  Allergen Reactions  . Lisinopril Swelling and Other (See Comments)    Lip swelling, patient reported - unable to confirm from chart     Medications Prior to Admission  Medication Sig Dispense Refill Last Dose  . albuterol (PROVENTIL HFA;VENTOLIN HFA) 108 (90 Base) MCG/ACT inhaler Inhale 2 puffs into the lungs every 6 (six) hours as needed for wheezing or shortness of breath. 18 g 1 02/24/2018 at Unknown time  . amLODipine (NORVASC) 5 MG tablet Take 1 tablet (5 mg total) by mouth daily. 90 tablet 3 02/25/2018 at 0700  . cyclobenzaprine (FLEXERIL) 10 MG tablet Take 1 tablet (10 mg total) by mouth 2 (two) times daily as needed for muscle spasms. 30 tablet 3 02/24/2018 at  Unknown time  . divalproex (DEPAKOTE ER) 500 MG 24 hr tablet Take 1 tablet (500 mg total) by mouth daily. 30 tablet 2 02/24/2018 at Unknown time  . gabapentin (NEURONTIN) 300 MG capsule Take 1 capsule (300 mg total) by mouth at bedtime. 90 capsule 1 Past Week at Unknown time  . hydrochlorothiazide (HYDRODIURIL) 25 MG tablet Take 1 tablet (25 mg total) by mouth daily. 90 tablet 3 Past Week at Unknown time  . ranitidine (ZANTAC) 150 MG tablet Take 1 tablet (150 mg total) by mouth 2 (two) times daily. 180 tablet 1 02/25/2018 at 0700  . sertraline (ZOLOFT) 50 MG tablet Take 2 tablets (100 mg total) by mouth daily. 180 tablet 1 02/24/2018 at Unknown time  . tetrahydrozoline 0.05 % ophthalmic  solution Place 1 drop into both eyes daily as needed (for dry eyes).    02/24/2018 at Unknown time    Review of Systems  Constitutional: Negative.   Respiratory: Negative.   Cardiovascular: Negative.   Gastrointestinal: Positive for abdominal pain.  Genitourinary: Negative.     Blood pressure 116/76, pulse 69, temperature 98 F (36.7 C), temperature source Oral, resp. rate 16, SpO2 97 %. Physical Exam  Constitutional: She appears well-developed and well-nourished.  Cardiovascular: Normal rate, regular rhythm and normal heart sounds.  Respiratory: Effort normal and breath sounds normal.  GI: Soft. Bowel sounds are normal.  Genitourinary:  Genitourinary Comments: Deferred to OR    No results found for this or any previous visit (from the past 24 hour(s)).  No results found.  Assessment/Plan: Probable right broad ligament fibroid  Pt is being admitted today for laparoscopic removal of fibroid. R/B/Post op care have been discussed with pt. She has verbalized understanding and desires to proceed.   Chancy Milroy 02/25/2018, 11:35 AM

## 2018-02-26 ENCOUNTER — Encounter (HOSPITAL_COMMUNITY): Payer: Self-pay | Admitting: Obstetrics and Gynecology

## 2018-02-26 ENCOUNTER — Telehealth: Payer: Self-pay | Admitting: *Deleted

## 2018-02-26 NOTE — Telephone Encounter (Signed)
Jean [name] called this pm and left message that she had outpatient surgery with Dr.Ervin and the prescription he gave her is not helping her pain. States wants to know if he can increase dose or if she can take it before the 6 hours is up. Wants a call back.

## 2018-02-27 ENCOUNTER — Telehealth: Payer: Self-pay | Admitting: Family Medicine

## 2018-02-27 NOTE — Telephone Encounter (Signed)
Pt called stating that Dr. Rip Harbour wanted her to call if her pain has not getting better. Also stated that the medication that she is currently prescribed is not helping and would like something different.

## 2018-03-03 ENCOUNTER — Ambulatory Visit (INDEPENDENT_AMBULATORY_CARE_PROVIDER_SITE_OTHER): Payer: Medicaid Other | Admitting: Physical Medicine and Rehabilitation

## 2018-03-03 ENCOUNTER — Ambulatory Visit (INDEPENDENT_AMBULATORY_CARE_PROVIDER_SITE_OTHER): Payer: Self-pay

## 2018-03-03 VITALS — BP 112/70 | HR 82 | Temp 98.4°F

## 2018-03-03 DIAGNOSIS — M5412 Radiculopathy, cervical region: Secondary | ICD-10-CM

## 2018-03-03 DIAGNOSIS — M4802 Spinal stenosis, cervical region: Secondary | ICD-10-CM

## 2018-03-03 DIAGNOSIS — R202 Paresthesia of skin: Secondary | ICD-10-CM

## 2018-03-03 MED ORDER — METHYLPREDNISOLONE ACETATE 80 MG/ML IJ SUSP
80.0000 mg | Freq: Once | INTRAMUSCULAR | Status: AC
  Administered 2018-03-03: 80 mg

## 2018-03-03 NOTE — Telephone Encounter (Signed)
Jean [name], NT 4 days ago     Pt called stating that Dr. Rip Harbour wanted her to call if her pain has not getting better. Also stated that the medication that she is currently prescribed is not helping and would like something different.       Called patient stating I am calling in to see how she is feeling. Patient states she is still hurting and today was the first time she was able to have a bowel movement. Patient reports taking stool softeners twice a day. Recommended she try OTC miralax and pain should improve then with more frequent bowel movements. Patient verbalized understanding & had no questions.

## 2018-03-03 NOTE — Patient Instructions (Signed)

## 2018-03-03 NOTE — Progress Notes (Signed)
Repeat C7-T1 IL .  Numeric Pain Rating Scale and Functional Assessment Average Pain 3   In the last MONTH (on 0-10 scale) has pain interfered with the following?  1. General activity like being  able to carry out your everyday physical activities such as walking, climbing stairs, carrying groceries, or moving a chair?  Rating(3)  +Driver, +BT (Aspirin 325mg ), -Dye Allergies.

## 2018-03-05 ENCOUNTER — Telehealth: Payer: Self-pay

## 2018-03-05 NOTE — Telephone Encounter (Signed)
Pt called to see if she could take off her bandages, advised pt that she could removed her bandages and to make sure she keeps the area clean. Pt also asked about getting an appointment for some vaginal discharge that she was having. Advised pt that if she didn't want to schedule an appointment because of her schedule we could see her at our after hours clinic from 4-730 pm. Pt also stated that she was no longer taking her Vicodin because it made her feel sick and was not using her Zoloft either. Pt verbalized understanding about bandages and had no more questions.

## 2018-03-05 NOTE — Telephone Encounter (Signed)
Pt called and stated that she has had surgery with Dr. Rip Harbour and she wants to know when can she take off the bandage.

## 2018-03-10 ENCOUNTER — Ambulatory Visit (INDEPENDENT_AMBULATORY_CARE_PROVIDER_SITE_OTHER): Payer: Medicaid Other | Admitting: Physician Assistant

## 2018-03-12 NOTE — Procedures (Signed)
Cervical Epidural Steroid Injection - Interlaminar Approach with Fluoroscopic Guidance  Patient: Jean [name]      Date of Birth: 12-15-1965 MRN: [id number] PCP: Alphonzo Grieve, MD      Visit Date: 03/03/2018   Universal Protocol:    Date/Time: 11/06/196:07 AM  Consent Given By: the patient  Position: PRONE  Additional Comments: Vital signs were monitored before and after the procedure. Patient was prepped and draped in the usual sterile fashion. The correct patient, procedure, and site was verified.   Injection Procedure Details:  Procedure Site One Meds Administered:  Meds ordered this encounter  Medications  . methylPREDNISolone acetate (DEPO-MEDROL) injection 80 mg     Laterality: Left  Location/Site: C7-T1  Needle size: 20 G  Needle type: Touhy  Needle Placement: Paramedian epidural space  Findings:  -Comments: Excellent flow of contrast into the epidural space.  Procedure Details: Using a paramedian approach from the side mentioned above, the region overlying the inferior lamina was localized under fluoroscopic visualization and the soft tissues overlying this structure were infiltrated with 4 ml. of 1% Lidocaine without Epinephrine. A # 20 gauge, Tuohy needle was inserted into the epidural space using a paramedian approach.  The epidural space was localized using loss of resistance along with lateral and contralateral oblique bi-planar fluoroscopic views.  After negative aspirate for air, blood, and CSF, a 2 ml. volume of Isovue-250 was injected into the epidural space and the flow of contrast was observed. Radiographs were obtained for documentation purposes.   The injectate was administered into the level noted above.  Additional Comments:  The patient tolerated the procedure well Dressing: Band-Aid    Post-procedure details: Patient was observed during the procedure. Post-procedure instructions were reviewed.  Patient left the clinic in stable  condition.

## 2018-03-12 NOTE — Progress Notes (Signed)
Jean [name] - 52 y.o. female MRN [id number]  Date of birth: [birth year]  Office Visit Note: Visit Date: 03/03/2018 PCP: Alphonzo Grieve, MD Referred by: Alphonzo Grieve, MD  Subjective: Chief Complaint  Patient presents with  . Neck - Pain, Follow-up   HPI:  Jean [name] is a 52 y.o. female who comes in today For planned repeat C7-T1 interlaminar epidural steroid injection.  Last injection was in April and she got more than 50% relief up until about a month ago.  Symptoms have returned without any new trauma or any other red flag complaints.  She gets pain around the left shoulder blade giving her difficulty with sleep at night.  She does get some numbness tingling in the left arm and hands.  She reports her pain level is up to a 10 this morning but on average is not been that bad.  She does ambulate with a rolling walker and cane.  She has not had any electrodiagnostic studies.  We will repeat the injection today.  She will continue to follow with Dr. [name].  ROS Otherwise per HPI.  Assessment & Plan: Visit Diagnoses:  1. Cervical radiculopathy   2. Foraminal stenosis of cervical region   3. Paresthesia of skin     Plan: No additional findings.   Meds & Orders:  Meds ordered this encounter  Medications  . methylPREDNISolone acetate (DEPO-MEDROL) injection 80 mg    Orders Placed This Encounter  Procedures  . XR C-ARM NO REPORT  . Epidural Steroid injection    Follow-up: Return if symptoms worsen or fail to improve, for Consider referral to Dr. Lorin Mercy and or EMG/NCS.   Procedures: No procedures performed  Cervical Epidural Steroid Injection - Interlaminar Approach with Fluoroscopic Guidance  Patient: Jean [name]      Date of Birth: Jun 08, 1965 MRN: [id number] PCP: Alphonzo Grieve, MD      Visit Date: 03/03/2018   Universal Protocol:    Date/Time: 11/06/196:07 AM  Consent Given By: the patient  Position: PRONE  Additional Comments: Vital signs were  monitored before and after the procedure. Patient was prepped and draped in the usual sterile fashion. The correct patient, procedure, and site was verified.   Injection Procedure Details:  Procedure Site One Meds Administered:  Meds ordered this encounter  Medications  . methylPREDNISolone acetate (DEPO-MEDROL) injection 80 mg     Laterality: Left  Location/Site: C7-T1  Needle size: 20 G  Needle type: Touhy  Needle Placement: Paramedian epidural space  Findings:  -Comments: Excellent flow of contrast into the epidural space.  Procedure Details: Using a paramedian approach from the side mentioned above, the region overlying the inferior lamina was localized under fluoroscopic visualization and the soft tissues overlying this structure were infiltrated with 4 ml. of 1% Lidocaine without Epinephrine. A # 20 gauge, Tuohy needle was inserted into the epidural space using a paramedian approach.  The epidural space was localized using loss of resistance along with lateral and contralateral oblique bi-planar fluoroscopic views.  After negative aspirate for air, blood, and CSF, a 2 ml. volume of Isovue-250 was injected into the epidural space and the flow of contrast was observed. Radiographs were obtained for documentation purposes.   The injectate was administered into the level noted above.  Additional Comments:  The patient tolerated the procedure well Dressing: Band-Aid    Post-procedure details: Patient was observed during the procedure. Post-procedure instructions were reviewed.  Patient left the clinic in stable condition.    Clinical  History: MRI CERVICAL SPINE WITHOUT CONTRAST  TECHNIQUE: Multiplanar, multisequence MR imaging of the cervical spine was performed. No intravenous contrast was administered.  COMPARISON:  Prior radiograph from 07/18/2016.  FINDINGS: Alignment: Trace anterolisthesis of C5 on C6 and C7 on T1. Mild straightening of the normal  cervical lordosis.  Vertebrae: Vertebral body heights maintained without evidence for acute or chronic fracture. Bone marrow signal intensity within normal limits. No discrete or worrisome osseous lesions. Reactive marrow edema about the right C5-6, C6-7, and C7-T1 facets (series 4, image 1) as well as the left C7-T1 facet (series 4, image 11) due to facet arthritis.  Cord: Signal intensity within the cervical spinal cord is normal.  Posterior Fossa, vertebral arteries, paraspinal tissues: Visualized brain and posterior fossa within normal limits. Craniocervical junction normal. Paraspinous and prevertebral soft tissues normal. Normal flow voids present within the vertebral arteries bilaterally.  Disc levels:  C2-C3: Unremarkable.  C3-C4: Diffuse disc bulge with mild bilateral uncovertebral hypertrophy. No significant spinal stenosis. Mild to moderate bilateral C4 foraminal narrowing.  C4-C5: Mild diffuse disc bulge. Left greater than right uncovertebral spurring. Moderate left greater than right facet arthrosis. No significant canal narrowing. Severe left with mild-to-moderate right C5 foraminal stenosis.  C5-C6: Diffuse disc bulge with mild intervertebral disc space narrowing. Bilateral uncovertebral hypertrophy. Left greater than right facet arthrosis. No significant spinal stenosis. Mild to moderate left with mild right C6 foraminal narrowing.  C6-C7: Mild disc bulge with bilateral uncovertebral hypertrophy. Moderate left greater than right facet arthrosis. Ligament flavum thickening. No significant canal stenosis. Severe left with mild to moderate right C7 foraminal stenosis.  C7-T1: Trace anterolisthesis. Minimal disc bulge. Advanced bilateral facet arthrosis with ligamentum flavum thickening, left worse than right. No significant spinal stenosis. Moderate to severe left with moderate right C8 foraminal stenosis.  Visualized upper thoracic spine within  normal limits.  IMPRESSION: 1. 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 2. Multifactorial degenerative changes with resultant multilevel foraminal narrowing as above. Given patient's left-sided symptoms, notable findings include severe left C5 foraminal stenosis with severe left C7 and C8 foraminal narrowing. Additional more mild foraminal encroachment as above. 3. Mild noncompressive disc bulging at C3-4 through C7-T1 without significant spinal stenosis.   Electronically Signed   By: Jeannine Boga M.D.   On: 04/20/2017 22:06     Objective:  VS:  HT:    WT:   BMI:     BP:112/70  HR:82bpm  TEMP:98.4 F (36.9 C)( )  RESP:100 % Physical Exam  Ortho Exam Imaging: No results found.

## 2018-03-26 ENCOUNTER — Encounter: Payer: Self-pay | Admitting: Obstetrics and Gynecology

## 2018-03-26 ENCOUNTER — Ambulatory Visit (INDEPENDENT_AMBULATORY_CARE_PROVIDER_SITE_OTHER): Payer: Self-pay | Admitting: Obstetrics and Gynecology

## 2018-03-26 VITALS — BP 123/84 | HR 77 | Ht 64.0 in | Wt 142.4 lb

## 2018-03-26 DIAGNOSIS — N898 Other specified noninflammatory disorders of vagina: Secondary | ICD-10-CM

## 2018-03-26 DIAGNOSIS — N760 Acute vaginitis: Secondary | ICD-10-CM

## 2018-03-26 DIAGNOSIS — B9689 Other specified bacterial agents as the cause of diseases classified elsewhere: Secondary | ICD-10-CM

## 2018-03-26 DIAGNOSIS — Z113 Encounter for screening for infections with a predominantly sexual mode of transmission: Secondary | ICD-10-CM

## 2018-03-26 DIAGNOSIS — Z9889 Other specified postprocedural states: Secondary | ICD-10-CM

## 2018-03-26 DIAGNOSIS — F329 Major depressive disorder, single episode, unspecified: Secondary | ICD-10-CM

## 2018-03-26 DIAGNOSIS — F32A Depression, unspecified: Secondary | ICD-10-CM

## 2018-03-26 HISTORY — DX: Other specified noninflammatory disorders of vagina: N89.8

## 2018-03-26 NOTE — Patient Instructions (Signed)

## 2018-03-26 NOTE — Progress Notes (Signed)
Patient ID: Jean [name], female   DOB: 10/21/1965, 52 y.o.   MRN: [id number] [name] presents for post op follow up. Denies bowel or bladder dysfunction Occ pain. Some vaginal discharge. She thinks might be related to other medications Pathology reviewed with pt.   PE AF VSS Lungs clear Heart RRR Abd soft + BS non tender incision sites well healed GU nl EGBUS scant tannish discharge cultures obtained no tenderness or masses  A/p Post op       Depression score increased       Vaginal discharge  Pt to return to nl ADL's. To see Roselyn Reef today. Vaginal swab collected today. Will treat per results F/U with yearly exam or PRN

## 2018-03-26 NOTE — Progress Notes (Signed)
Pt states has been having d/c with itching but no order. Has had d/c since surgery but thinks it has something to do with the medicines, Gabapentin & Depakote because when she stopped taking those meds the d/c stopped.

## 2018-03-26 NOTE — Addendum Note (Signed)
Addended by: Bethanne Ginger on: 03/26/2018 10:53 AM   Modules accepted: Orders

## 2018-03-26 NOTE — BH Specialist Note (Deleted)
Integrated Behavioral Health Follow Up Visit  MRN: [id number] Name: Jean [name]  Number of [place] Clinician visits: 1/6 2 Total Session Start time: ***  Session End time: *** Total time: {IBH Total Time:21014050}  Type of Service: [place] Interpretor:No. Interpretor Name and Language: n/a  SUBJECTIVE: Jean [name] is a 52 y.o. female accompanied by {Patient accompanied by:[phone]} Patient was referred by Arlina Robes, MD for symptoms of depression and anxiety. Patient reports the following symptoms/concerns: *** Duration of problem: ***; Severity of problem: {Mild/Moderate/Severe:20260}  OBJECTIVE: Mood: {BHH MOOD:22306} and Affect: {BHH AFFECT:22307} Risk of harm to self or others: {CHL AMB BH Suicide Current Mental Status:21022748}  LIFE CONTEXT: Family and Social: *** School/Work: *** Self-Care: *** Life Changes: ***  GOALS ADDRESSED: Patient will: 1.  Reduce symptoms of: {IBH Symptoms:21014056}  2.  Increase knowledge and/or ability of: {IBH Patient Tools:21014057}  3.  Demonstrate ability to: {IBH Goals:21014053}  INTERVENTIONS: Interventions utilized:  {IBH Interventions:21014054} Standardized Assessments completed: {IBH Screening Tools:21014051}  ASSESSMENT: Patient currently experiencing ***.   Patient may benefit from ***.  PLAN: 1. Follow up with behavioral health clinician on : *** 2. Behavioral recommendations: *** 3. Referral(s): {IBH Referrals:21014055} 4. "From scale of 1-10, how likely are you to follow plan?": ***  Caroleen Hamman McMannes, LCSW

## 2018-03-27 LAB — CERVICOVAGINAL ANCILLARY ONLY
BACTERIAL VAGINITIS: POSITIVE — AB
Candida vaginitis: NEGATIVE
Chlamydia: NEGATIVE
Neisseria Gonorrhea: NEGATIVE
TRICH (WINDOWPATH): POSITIVE — AB

## 2018-03-28 ENCOUNTER — Telehealth: Payer: Self-pay

## 2018-03-28 MED ORDER — METRONIDAZOLE 500 MG PO TABS: 500.0000 mg | ORAL_TABLET | Freq: Two times a day (BID) | ORAL | 0 refills | Status: DC

## 2018-03-28 NOTE — Telephone Encounter (Signed)
Per Dr.Ervin pt needs to be informed that she has Trich and to make sure that her partner(s) are treated.   LM for pt to return call for results.

## 2018-04-01 NOTE — Telephone Encounter (Signed)
LM for partner to have pt give the office a call.  He stated that she would relate the message.  Letter sent.

## 2018-04-10 ENCOUNTER — Telehealth: Payer: Self-pay | Admitting: *Deleted

## 2018-04-10 NOTE — Telephone Encounter (Signed)
Received a voice mail stating she is calling re: results.

## 2018-04-10 NOTE — Telephone Encounter (Signed)
Returned pt's call.  Pt requesting wet prep results.  Informed pt that she tested positive for BV and Trich and that a prescription for Flagyl had been called into the [facility] on [address].  Informed pt that her partner would need to be treated and they needed to refrain from intercourse until after TOC.  Informed pt that she would need to schedule an appointment for TOC 3-4 weeks after she finished treatment.  Pt verbalized understanding.

## 2018-04-14 ENCOUNTER — Telehealth: Payer: Self-pay | Admitting: *Deleted

## 2018-04-14 NOTE — Telephone Encounter (Signed)
Jean [name] called and left a voice message she wants a call back about her medication- states she doesn't have her medicaid card and it is coming in the mail. States can't get her meds and doesn't know what to do.

## 2018-04-15 NOTE — Telephone Encounter (Signed)
I called Jean [name] and she states she got her card yesterday so she got her med. States she needs to make an appointment to see Dr.Ervin after she finished med; which will be Friday.Wants to be seen before Christmas- I told her I am not sure that there are appointments but I would send message to registrars and they will call her.

## 2018-04-23 ENCOUNTER — Ambulatory Visit (INDEPENDENT_AMBULATORY_CARE_PROVIDER_SITE_OTHER): Payer: Self-pay | Admitting: Physician Assistant

## 2018-04-23 ENCOUNTER — Ambulatory Visit (INDEPENDENT_AMBULATORY_CARE_PROVIDER_SITE_OTHER): Payer: Self-pay

## 2018-04-23 ENCOUNTER — Encounter (INDEPENDENT_AMBULATORY_CARE_PROVIDER_SITE_OTHER): Payer: Self-pay | Admitting: Physician Assistant

## 2018-04-23 ENCOUNTER — Telehealth (INDEPENDENT_AMBULATORY_CARE_PROVIDER_SITE_OTHER): Payer: Self-pay | Admitting: Physical Medicine and Rehabilitation

## 2018-04-23 DIAGNOSIS — M25552 Pain in left hip: Secondary | ICD-10-CM

## 2018-04-23 MED ORDER — METHYLPREDNISOLONE ACETATE 40 MG/ML IJ SUSP
40.0000 mg | INTRAMUSCULAR | Status: AC | PRN
  Administered 2018-04-23: 40 mg via INTRA_ARTICULAR

## 2018-04-23 MED ORDER — LIDOCAINE HCL 1 % IJ SOLN
3.0000 mL | INTRAMUSCULAR | Status: AC | PRN
  Administered 2018-04-23: 3 mL

## 2018-04-23 NOTE — Progress Notes (Signed)
Office Visit Note   Patient: Jean [name]           Date of Birth: [birth year]           MRN: [id number] Visit Date: 04/23/2018              Requested by: Alphonzo Grieve, MD [address] [place], [place] [address] PCP: Alphonzo Grieve, MD   Assessment & Plan: Visit Diagnoses:  1. Pain in left hip     Plan: We will have her undergo EMG nerve conduction studies upper extremities rule out carpal tunnel syndrome bilaterally.  Follow-up after EMG nerve conduction studies.  Also send her to physical therapy for IT band stretching bilaterally quad strengthening bilaterally along with home exercise program.  Questions encouraged and answered at length.  Follow-Up Instructions: Return After EMG/NCS.   Orders:  Orders Placed This Encounter  Procedures  . Large Joint Inj  . XR HIP UNILAT W OR W/O PELVIS 2-3 VIEWS LEFT   No orders of the defined types were placed in this encounter.     Procedures: Large Joint Inj: L greater trochanter on 04/23/2018 5:35 PM Indications: pain Details: 22 G 1.5 in needle, lateral approach  Arthrogram: No  Medications: 3 mL lidocaine 1 %; 40 mg methylPREDNISolone acetate 40 MG/ML Outcome: tolerated well, no immediate complications Procedure, treatment alternatives, risks and benefits explained, specific risks discussed. Consent was given by the patient. Immediately prior to procedure a time out was called to verify the correct patient, procedure, equipment, support staff and site/side marked as required. Patient was prepped and draped in the usual sterile fashion.       Clinical Data: No additional findings.   Subjective: Chief Complaint  Patient presents with  . Right Knee - Pain  . Right Hand - Pain  . Left Hand - Pain    HPI Jean [name] returns today due to right knee pain and bilateral hip pain.  She is status post right hip trochanteric injection on 02/17/2018.  She states the hip injection did help.  She is having pain lateral aspect  of her left hip.  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  She is having a radicular symptoms down either leg.  Pains mostly lateral aspect both hips she is unable to lie on either hip due to pain.  She recently underwent an MRI of her right knee which showed some inflammation of Hoffa's pad otherwise just mild arthritic changes.  Had a intra-articular injection on 02/07/2018 which she states helped some but she still having pain in the right knee.  She had no new injury to either hip or either knee. Patient is also complaining of numbness in both hands.  She reports a history of carpal tunnel syndrome that was diagnosed in 2004.  She states her hands do awaken her and that they go numb and she has to shake them at times.  She has a history of cervical stenosis at multiple levels most significant level pain at C5 on the left with severe foraminal stenosis and C7-C8 due to foraminal narrowing.  She underwent C7-T1 and her laminar epidural steroid injection by Ernestina Patches in the past. Review of Systems Please see HPI otherwise negative  Objective: Vital Signs: LMP  (LMP Unknown)   Physical Exam Constitutional:      General: She is not in acute distress.    Appearance: Normal appearance. She is normal weight. She is not diaphoretic.  Pulmonary:     Effort:  Pulmonary effort is normal.  Neurological:     Mental Status: She is alert.  Psychiatric:        Mood and Affect: Mood normal.     Ortho Exam Hands she has sensation intact throughout both hands.  Positive Tinel's at the wrist bilaterally. Bilateral hips good range of motion without pain.  Tenderness over the trochanteric region both hips left greater than right. Right knee good range of motion.  Nontender along medial lateral joint line.  She has tenderness over the distal quad tendon near the insertion on the patella.  Able do straight leg raise.  No deficit is quad tendon.  Specialty Comments:  No specialty comments  available.  Imaging: Xr Hip Unilat W Or W/o Pelvis 2-3 Views Left  Result Date: 04/23/2018 AP pelvis and lateral view left hip: No acute fractures.  Bilateral hips well located.  Left hip status post total hip arthroplasty with no evidence of hardware failure.  No evidence of loosening.    PMFS History: Patient Active Problem List   Diagnosis Date Noted  . Post-operative state 03/26/2018  . Vaginal discharge 03/26/2018  . Dyspareunia in female 09/13/2017  . Cervicalgia 04/24/2017  . CKD (chronic kidney disease) 10/29/2016  . Chest wall pain 10/15/2016  . Chronic pain syndrome 05/10/2016  . MDD (major depressive disorder) 10/19/2015  . Dyspnea on exertion 08/22/2015  . HTN (hypertension) 07/08/2015  . Routine health maintenance 07/08/2015  . Tobacco abuse 07/08/2015   Past Medical History:  Diagnosis Date  . Adnexal mass 2019   likely remnant fibroid on broad ligament; getting diagnostic lap  . Anxiety   . Arthritis    hip  . Asthma   . Carpal tunnel syndrome    bilateral  . Chronic lower back pain   . Depression   . Dyspnea    with exertion  . GERD (gastroesophageal reflux disease)   . Headache    migraines  . Herpes   . Hypertension   . Liver lesion, right lobe 12/01/2015   CT chest without contrast 11/30/15 with hepatic lesions measuring 2.0 x 1.7 cm of the right lobe and 1 x 1 cm on the dome. CT abd/pel 8/18: 1.3cm inferior R liver lobe, prob benign but recommend 3-63mo f/u with MRI w/wo IV contrast with attenuation MRI 6/19: combination of benign cavernous hemangiomas and small benign cysts  . Poor dental hygiene    chipped upper front and several loose teeth  . Substance abuse ([place])    cocaine/crack cocaine - last use 02/07/18  . SVD (spontaneous vaginal delivery)    x 1  . Uses walker    for ambulation  . Uterine leiomyoma 02/16/2016   s/p vaginal hysterectomy 2018    Family History  Problem Relation Age of Onset  . Cancer Mother   . Hypertension Mother    . Diabetes Mother   . Stroke Father     Past Surgical History:  Procedure Laterality Date  . LAPAROSCOPIC OVARIAN CYSTECTOMY Right 02/25/2018   Procedure: LAPAROSCOPIC REMOVAL OF RIGHT ADNEXAL MASS;  Surgeon: Chancy Milroy, MD;  Location: [facility];  Service: Gynecology;  Laterality: Right;  . SALPINGECTOMY Left    lap - ectopic preg  . TOTAL HIP ARTHROPLASTY Left 01/20/2016   Procedure: LEFT TOTAL HIP ARTHROPLASTY ANTERIOR APPROACH;  Surgeon: Mcarthur Rossetti, MD;  Location: [facility];  Service: Orthopedics;  Laterality: Left;  . TUBAL LIGATION    . VAGINAL HYSTERECTOMY Right 12/11/2016   Procedure: HYSTERECTOMY VAGINAL  WITH RIGHT SALPINGECTOMY;  Surgeon: Chancy Milroy, MD;  Location: [facility];  Service: Gynecology;  Laterality: Right;   Social History   Occupational History  . Not on file  Tobacco Use  . Smoking status: Current Every Day Smoker    Packs/day: 0.25    Years: 11.00    Pack years: 2.75    Types: Cigarettes  . Smokeless tobacco: Never Used  Substance and Sexual Activity  . Alcohol use: Not Currently    Alcohol/week: 3.0 standard drinks    Types: 3 Cans of beer per week    Comment: Occasional Beer   . Drug use: No    Types: "Crack" cocaine, Cocaine    Comment: Last use 2 wks ago 02/07/18  . Sexual activity: Yes    Partners: Male    Birth control/protection: None, Post-menopausal    Comment: Hysterectomy

## 2018-04-24 ENCOUNTER — Other Ambulatory Visit (INDEPENDENT_AMBULATORY_CARE_PROVIDER_SITE_OTHER): Payer: Self-pay

## 2018-04-24 ENCOUNTER — Other Ambulatory Visit: Payer: Self-pay | Admitting: Neurology

## 2018-04-24 ENCOUNTER — Other Ambulatory Visit: Payer: Self-pay | Admitting: Internal Medicine

## 2018-04-24 DIAGNOSIS — M79641 Pain in right hand: Secondary | ICD-10-CM

## 2018-04-24 DIAGNOSIS — M542 Cervicalgia: Secondary | ICD-10-CM

## 2018-04-24 DIAGNOSIS — M79642 Pain in left hand: Principal | ICD-10-CM

## 2018-04-24 DIAGNOSIS — R0609 Other forms of dyspnea: Principal | ICD-10-CM

## 2018-04-24 DIAGNOSIS — G894 Chronic pain syndrome: Secondary | ICD-10-CM

## 2018-04-24 NOTE — Telephone Encounter (Signed)
Ok but she appears to have referral for EMG/NCS as well.

## 2018-04-24 NOTE — Telephone Encounter (Signed)
Left message for patient to call back to schedule.  °

## 2018-04-24 NOTE — Telephone Encounter (Signed)
Next appt scheduled 05/14/18 with PCP. 

## 2018-04-25 ENCOUNTER — Other Ambulatory Visit: Payer: Self-pay

## 2018-04-25 DIAGNOSIS — F4321 Adjustment disorder with depressed mood: Secondary | ICD-10-CM

## 2018-04-25 MED ORDER — DIVALPROEX SODIUM ER 500 MG PO TB24: 500.0000 mg | ORAL_TABLET | Freq: Every day | ORAL | 6 refills | Status: DC

## 2018-04-25 NOTE — Telephone Encounter (Signed)
gabapentin (NEURONTIN) 300 MG capsule  PROVENTIL HFA 108 (90 Base) MCG/ACT inhaler  cyclobenzaprine (FLEXERIL) 10 MG tablet  sertraline (ZOLOFT) 50 MG tablet, REFILL REQUEST @  [facility] [place], [place] - [place] [phone] (Phone) [phone] (Fax)

## 2018-04-26 MED ORDER — SERTRALINE HCL 50 MG PO TABS: 100.0000 mg | ORAL_TABLET | Freq: Every day | ORAL | 2 refills | Status: DC

## 2018-04-26 MED ORDER — GABAPENTIN 300 MG PO CAPS: 300.0000 mg | ORAL_CAPSULE | Freq: Every day | ORAL | 1 refills | Status: DC

## 2018-04-28 ENCOUNTER — Other Ambulatory Visit (INDEPENDENT_AMBULATORY_CARE_PROVIDER_SITE_OTHER): Payer: Self-pay

## 2018-04-28 DIAGNOSIS — M79641 Pain in right hand: Secondary | ICD-10-CM

## 2018-04-28 DIAGNOSIS — M79642 Pain in left hand: Principal | ICD-10-CM

## 2018-05-08 ENCOUNTER — Ambulatory Visit: Payer: Self-pay | Admitting: Obstetrics and Gynecology

## 2018-05-12 ENCOUNTER — Telehealth: Payer: Self-pay | Admitting: Physical Therapy

## 2018-05-12 ENCOUNTER — Ambulatory Visit: Payer: Medicaid Other | Attending: Physician Assistant | Admitting: Physical Therapy

## 2018-05-12 NOTE — Telephone Encounter (Signed)
Pt no show for PT appointment today. They where contacted and informed of this. They where given number to call front office to reschedule and she relays she is waiting for her Medicaid card to come in first and she will be out of town.  Elsie Ra, PT, DPT 05/12/18 5:30 PM

## 2018-05-13 ENCOUNTER — Encounter: Payer: Self-pay | Admitting: Physical Medicine & Rehabilitation

## 2018-05-14 ENCOUNTER — Encounter: Payer: Self-pay | Admitting: Internal Medicine

## 2018-05-14 NOTE — Progress Notes (Deleted)
   CC: ***  HPI:  [name] is a 53 y.o. with a PMH of ***  Please see problem based Assessment and Plan for status of patients chronic conditions.  Past Medical History:  Diagnosis Date  . Adnexal mass 2019   likely remnant fibroid on broad ligament; getting diagnostic lap  . Anxiety   . Arthritis    hip  . Asthma   . Carpal tunnel syndrome    bilateral  . Chronic lower back pain   . Depression   . Dyspnea    with exertion  . GERD (gastroesophageal reflux disease)   . Headache    migraines  . Herpes   . Hypertension   . Liver lesion, right lobe 12/01/2015   CT chest without contrast 11/30/15 with hepatic lesions measuring 2.0 x 1.7 cm of the right lobe and 1 x 1 cm on the dome. CT abd/pel 8/18: 1.3cm inferior R liver lobe, prob benign but recommend 3-71mo f/u with MRI w/wo IV contrast with attenuation MRI 6/19: combination of benign cavernous hemangiomas and small benign cysts  . Poor dental hygiene    chipped upper front and several loose teeth  . Substance abuse ([place])    cocaine/crack cocaine - last use 02/07/18  . SVD (spontaneous vaginal delivery)    x 1  . Uses walker    for ambulation  . Uterine leiomyoma 02/16/2016   s/p vaginal hysterectomy 2018    Review of Systems:   ***  Physical Exam:  There were no vitals filed for this visit. GENERAL- alert, co-operative, appears as stated age, not in any distress. HEENT- Atraumatic, normocephalic, PERRL, EOMI, oral mucosa appears moist. CARDIAC- RRR, no murmurs, rubs or gallops. RESP- Moving equal volumes of air, and clear to auscultation bilaterally, no wheezes or crackles. ABDOMEN- Soft, nontender, bowel sounds present. NEURO- CN 2-12 grossly intact. EXTREMITIES- pulse 2+, symmetric, no pedal edema. SKIN- Warm, dry, no rash or lesion. PSYCH- Normal mood and affect, appropriate thought content and speech.  Assessment & Plan:   See Encounters Tab for problem based charting.   Patient  {GC/GE:3044014::"discussed with","seen with"} Dr. {NAMES:3044014::"Butcher","Granfortuna","E. Hoffman","Klima","Mullen","Narendra","Raines","Vincent"}   Alphonzo Grieve, MD Internal Medicine PGY-3

## 2018-05-29 ENCOUNTER — Encounter: Payer: Medicaid Other | Attending: Physical Medicine & Rehabilitation | Admitting: Physical Medicine & Rehabilitation

## 2018-06-20 ENCOUNTER — Telehealth (INDEPENDENT_AMBULATORY_CARE_PROVIDER_SITE_OTHER): Payer: Self-pay | Admitting: Orthopaedic Surgery

## 2018-06-20 NOTE — Telephone Encounter (Signed)
Refer for cspine ESI?

## 2018-06-20 NOTE — Telephone Encounter (Signed)
[facility]

## 2018-06-23 ENCOUNTER — Encounter (INDEPENDENT_AMBULATORY_CARE_PROVIDER_SITE_OTHER): Payer: Self-pay | Admitting: Physician Assistant

## 2018-06-23 ENCOUNTER — Ambulatory Visit (INDEPENDENT_AMBULATORY_CARE_PROVIDER_SITE_OTHER): Payer: Medicaid Other | Admitting: Physician Assistant

## 2018-06-23 ENCOUNTER — Other Ambulatory Visit (INDEPENDENT_AMBULATORY_CARE_PROVIDER_SITE_OTHER): Payer: Self-pay

## 2018-06-23 ENCOUNTER — Encounter: Payer: Self-pay | Admitting: Internal Medicine

## 2018-06-23 ENCOUNTER — Ambulatory Visit: Payer: Medicaid Other | Admitting: Internal Medicine

## 2018-06-23 VITALS — BP 137/100 | HR 84 | Temp 98.1°F | Wt 150.0 lb

## 2018-06-23 DIAGNOSIS — M7062 Trochanteric bursitis, left hip: Secondary | ICD-10-CM

## 2018-06-23 DIAGNOSIS — M7061 Trochanteric bursitis, right hip: Secondary | ICD-10-CM

## 2018-06-23 DIAGNOSIS — N644 Mastodynia: Secondary | ICD-10-CM | POA: Diagnosis not present

## 2018-06-23 DIAGNOSIS — Z79899 Other long term (current) drug therapy: Secondary | ICD-10-CM | POA: Diagnosis not present

## 2018-06-23 DIAGNOSIS — Z8709 Personal history of other diseases of the respiratory system: Secondary | ICD-10-CM | POA: Diagnosis not present

## 2018-06-23 DIAGNOSIS — M79661 Pain in right lower leg: Secondary | ICD-10-CM

## 2018-06-23 DIAGNOSIS — R0600 Dyspnea, unspecified: Secondary | ICD-10-CM | POA: Diagnosis not present

## 2018-06-23 DIAGNOSIS — F17211 Nicotine dependence, cigarettes, in remission: Secondary | ICD-10-CM | POA: Diagnosis not present

## 2018-06-23 DIAGNOSIS — M79642 Pain in left hand: Secondary | ICD-10-CM

## 2018-06-23 DIAGNOSIS — Z90710 Acquired absence of both cervix and uterus: Secondary | ICD-10-CM

## 2018-06-23 DIAGNOSIS — M542 Cervicalgia: Secondary | ICD-10-CM

## 2018-06-23 DIAGNOSIS — R0609 Other forms of dyspnea: Principal | ICD-10-CM

## 2018-06-23 NOTE — Telephone Encounter (Signed)
Sent order to [facility] radiology

## 2018-06-23 NOTE — Patient Instructions (Signed)
Ms. Sutherlin,   For your breast pain we will do a mammogram and ultrasound of your breast for further evaluation.  The breast center will call you to schedule this appointment.  For your shortness of breath we will need to do testing of your lungs called pulmonary function tests.  Hospital will give you a call to schedule this appointment as well.  Call us if you have any questions or concerns.  -Dr. Frederico Hamman

## 2018-06-23 NOTE — Progress Notes (Signed)
HPI: [name] is well-known to Dr. [name] service comes in today complaining of bilateral hand pain tingling sensation.  She unfortunately did not get EMG nerve conduction studies.  She states that she did not have her Medicaid card now has this.  She also continues to have pain right lateral hip.  Pain down to her knee she feels that her knee may be involved she has had previous MRI of her knee which just showed some mild arthritic changes but no meniscal tear or ligamentous injury.  She has had no new injury to the right hip or knee.  She is walking with a walker.  She does report that the left trochanteric injection did help some.  She is having no radicular symptoms down either leg.   Review of systems: Please see HPI otherwise negative  Physical exam: Bilateral hands sensation grossly intact bilateral hands.  She has positive Tinel's at the wrist bilaterally positive compression test at the wrist over the median nerve bilaterally. Bilateral hips good range of motion both hips.  She has tenderness down the IT bands of both hips.  Also has tenderness at the trochanteric region of both hips.  Right knee good range of motion without pain.   Impression: Bilateral hand pain/numbness Bilateral trochanteric bursitis  Plan: We will have her undergo EMG nerve conduction studies to rule out carpal tunnel as a source of her pain and numbness in the hands.  Have her follow-up once these are done.  Also send her to physical therapy for quad strengthening, IT band stretching, home exercise program and modalities.  She may benefit from repeat cortisone injection in either hip in the future if pain persist despite these conservative measures.  Questions encouraged and answered.

## 2018-06-23 NOTE — Assessment & Plan Note (Addendum)
Patient presents with a 35-month history of intermittent, sharp bilateral breast pain. She denies recent illness or trauma, fever, chills, nipple discharge,and changes in skin surrounding her breasts. She has never had a mammogram. She does not have a personal history of malignancy, but has a family history of breast cancer in her mother who was diagnosed in her 45s. Her last menstrual period was in 2018 after a hysterectomy. On exam, breast tissue appears lumpy/fibrocystic in nature. Patient is tender to palpation diffusely. No obvious lumps or masses. No lymphadenopathy, discharge, or skin changes noted. Ordered bilateral diagnostic mammogram and bilateral breast US.

## 2018-06-23 NOTE — Assessment & Plan Note (Addendum)
Jean [name] presents complaining of shortness of breath that is worse on exertion. She denies recent illness, fever, chills, chest pain, palpitations, orthopnea, PND, fatigue, and syncope. She reports having asthma as a child. She has an albuterol inhaler at home that she has used twice this morning with no relief in symptoms. She reports smoking 1ppd for about 20 years, but quit 1 week ago. She has never had pulmonary function tests.  On exam, she appears comfortable and is not in any acute distress.  She has decreased breath sounds throughout but I suspect this is due to poor patient effort, otherwise lungs sound clear to auscultation bilaterally.  She is breathing comfortably on room air and oxygenating well.  Cardiac exam is unremarkable, I did not appreciate any murmurs.  Unclear what is causing patient's chronic dyspnea.  Differential diagnosis includes asthma, COPD, pulmonary hypertension, etc. Low suspicion for an infectious etiology at this time.  Will order PFTs for further evaluation.

## 2018-06-23 NOTE — Progress Notes (Signed)
   CC: Shortness of breath and breast pain   HPI:  [name] is a 53 y.o. year-old female with PMH listed below who presents to clinic for shortness of breath and breast pain. Please see problem based assessment and plan for further details.   Past Medical History:  Diagnosis Date  . Adnexal mass 2019   likely remnant fibroid on broad ligament; getting diagnostic lap  . Anxiety   . Arthritis    hip  . Asthma   . Carpal tunnel syndrome    bilateral  . Chronic lower back pain   . Depression   . Dyspnea    with exertion  . GERD (gastroesophageal reflux disease)   . Headache    migraines  . Herpes   . Hypertension   . Liver lesion, right lobe 12/01/2015   CT chest without contrast 11/30/15 with hepatic lesions measuring 2.0 x 1.7 cm of the right lobe and 1 x 1 cm on the dome. CT abd/pel 8/18: 1.3cm inferior R liver lobe, prob benign but recommend 3-48mo f/u with MRI w/wo IV contrast with attenuation MRI 6/19: combination of benign cavernous hemangiomas and small benign cysts  . Poor dental hygiene    chipped upper front and several loose teeth  . Substance abuse ([place])    cocaine/crack cocaine - last use 02/07/18  . SVD (spontaneous vaginal delivery)    x 1  . Uses walker    for ambulation  . Uterine leiomyoma 02/16/2016   s/p vaginal hysterectomy 2018   Review of Systems:   Review of Systems  Constitutional: Negative for chills, fever, malaise/fatigue and weight loss.  Respiratory: Positive for shortness of breath. Negative for cough and wheezing.   Cardiovascular: Negative for chest pain and palpitations.    Physical Exam: Vitals:   06/23/18 0900  BP: (!) 137/100  Pulse: 84  Temp: 98.1 F (36.7 C)  TempSrc: Oral  SpO2: 99%  Weight: 150 lb (68 kg)    General: chronically-ill appearing female in no acute distress  Cardiac: regular rate and rhythm, nl S1/S2, no murmurs, rubs or gallops  Pulm: decreased breath sounds throughout though poor effort on exam,  otherwise clear to auscultation  Breast: breast appears symmetrical and are tender to palpation diffusely with no specific area more tender than others. Breast tissue appears lumpy/fibrocystic. There is no erythema, lesions, dimpling or skin retraction noted. There was no  axillary, supraclavicular, or infraclavicular lymphadenopathy. No nipple discharge. No masses palpated.    Assessment & Plan:   See Encounters Tab for problem based charting.  Patient discussed with Dr. Angelia Mould

## 2018-06-24 ENCOUNTER — Other Ambulatory Visit (INDEPENDENT_AMBULATORY_CARE_PROVIDER_SITE_OTHER): Payer: Self-pay

## 2018-06-24 DIAGNOSIS — M79642 Pain in left hand: Principal | ICD-10-CM

## 2018-06-24 DIAGNOSIS — M79641 Pain in right hand: Secondary | ICD-10-CM

## 2018-06-24 NOTE — Progress Notes (Signed)
Internal Medicine Clinic Attending  Case discussed with Dr. Santos-Sanchez at the time of the visit.  We reviewed the resident's history and exam and pertinent patient test results.  I agree with the assessment, diagnosis, and plan of care documented in the resident's note.    

## 2018-06-26 ENCOUNTER — Other Ambulatory Visit: Payer: Self-pay | Admitting: Internal Medicine

## 2018-07-01 ENCOUNTER — Ambulatory Visit: Payer: Self-pay

## 2018-07-01 ENCOUNTER — Other Ambulatory Visit: Payer: Self-pay | Admitting: *Deleted

## 2018-07-01 ENCOUNTER — Ambulatory Visit
Admission: RE | Admit: 2018-07-01 | Discharge: 2018-07-01 | Disposition: A | Discharge status: Home / Self Care | Payer: Medicaid Other | Source: Ambulatory Visit | Attending: Internal Medicine | Admitting: Internal Medicine

## 2018-07-01 DIAGNOSIS — M79641 Pain in right hand: Secondary | ICD-10-CM

## 2018-07-01 DIAGNOSIS — M79642 Pain in left hand: Principal | ICD-10-CM

## 2018-07-01 DIAGNOSIS — N644 Mastodynia: Secondary | ICD-10-CM

## 2018-07-03 ENCOUNTER — Encounter: Payer: Self-pay | Admitting: Neurology

## 2018-07-04 ENCOUNTER — Ambulatory Visit (HOSPITAL_COMMUNITY)
Admission: RE | Admit: 2018-07-04 | Discharge: 2018-07-04 | Disposition: A | Discharge status: Home / Self Care | Payer: Medicaid Other | Source: Ambulatory Visit | Attending: Internal Medicine | Admitting: Internal Medicine

## 2018-07-04 DIAGNOSIS — R0609 Other forms of dyspnea: Secondary | ICD-10-CM | POA: Diagnosis present

## 2018-07-04 LAB — PULMONARY FUNCTION TEST
DL/VA % PRED: 93 %
DL/VA: 3.96 ml/min/mmHg/L
DLCO unc % pred: 78 %
DLCO unc: 16.11 ml/min/mmHg
FEF 25-75 Post: 3.86 L/sec
FEF 25-75 Pre: 3.33 L/sec
FEF2575-%Change-Post: 16 %
FEF2575-%Pred-Post: 170 %
FEF2575-%Pred-Pre: 147 %
FEV1-%Change-Post: 3 %
FEV1-%Pred-Post: 93 %
FEV1-%Pred-Pre: 90 %
FEV1-Post: 2.08 L
FEV1-Pre: 2 L
FEV1FVC-%Change-Post: -13 %
FEV1FVC-%Pred-Pre: 120 %
FEV6-%Change-Post: 21 %
FEV6-%Pred-Post: 91 %
FEV6-%Pred-Pre: 75 %
FEV6-Post: 2.48 L
FEV6-Pre: 2.03 L
FEV6FVC-%Pred-Post: 103 %
FEV6FVC-%Pred-Pre: 103 %
FVC-%Change-Post: 20 %
FVC-%Pred-Post: 88 %
FVC-%Pred-Pre: 73 %
FVC-Post: 2.48 L
FVC-Pre: 2.06 L
Post FEV1/FVC ratio: 84 %
Post FEV6/FVC ratio: 100 %
Pre FEV1/FVC ratio: 97 %
Pre FEV6/FVC Ratio: 100 %
RV % pred: 110 %
RV: 2.08 L
TLC % PRED: 88 %
TLC: 4.49 L

## 2018-07-04 MED ORDER — ALBUTEROL SULFATE (2.5 MG/3ML) 0.083% IN NEBU
2.5000 mg | INHALATION_SOLUTION | Freq: Once | RESPIRATORY_TRACT | Status: AC
  Administered 2018-07-04: 2.5 mg via RESPIRATORY_TRACT

## 2018-07-08 ENCOUNTER — Ambulatory Visit
Admission: RE | Admit: 2018-07-08 | Discharge: 2018-07-08 | Disposition: A | Discharge status: Home / Self Care | Payer: Medicaid Other | Source: Ambulatory Visit | Attending: Orthopaedic Surgery | Admitting: Orthopaedic Surgery

## 2018-07-08 DIAGNOSIS — M542 Cervicalgia: Secondary | ICD-10-CM

## 2018-07-08 MED ORDER — TRIAMCINOLONE ACETONIDE 40 MG/ML IJ SUSP (RADIOLOGY)
60.0000 mg | Freq: Once | INTRAMUSCULAR | Status: AC
  Administered 2018-07-08: 60 mg via EPIDURAL

## 2018-07-08 MED ORDER — IOPAMIDOL (ISOVUE-M 300) INJECTION 61%
1.0000 mL | Freq: Once | INTRAMUSCULAR | Status: AC | PRN
  Administered 2018-07-08: 1 mL via EPIDURAL

## 2018-07-08 NOTE — Discharge Instructions (Signed)

## 2018-07-09 IMAGING — CT CT ABD-PELV W/ CM
1 of 3 series · 12 of 32 positions shown, 17 images · IV contrast (iopamidol)
Comparison: 02/03/2016 pelvic sonogram.

CLINICAL DATA: Status post total vaginal hysterectomy 6 days prior
for uterine fibroids. Patient presents with lower abdominal pain and
distention, right greater than left.

EXAM:
CT ABDOMEN AND PELVIS WITH CONTRAST
TECHNIQUE: Multidetector CT imaging of the abdomen and pelvis was performed
using the standard protocol following bolus administration of
intravenous contrast.
CONTRAST:  100mL [id number] IOPAMIDOL ([id number]) INJECTION 61%

[Series 2: routine abdomen/pelvis with · axial · 0.65mm/px · z∈[-419,-29]mm · 12 of 91 slices shown, 17 images]
[im 7/91  soft-tissue]
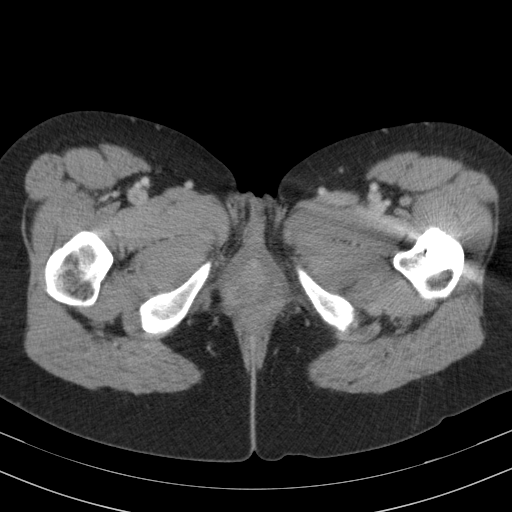
[im 7/91  bone]
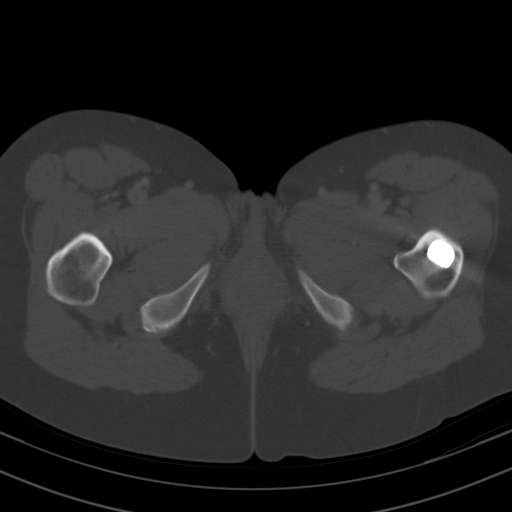
[im 13/91  soft-tissue]
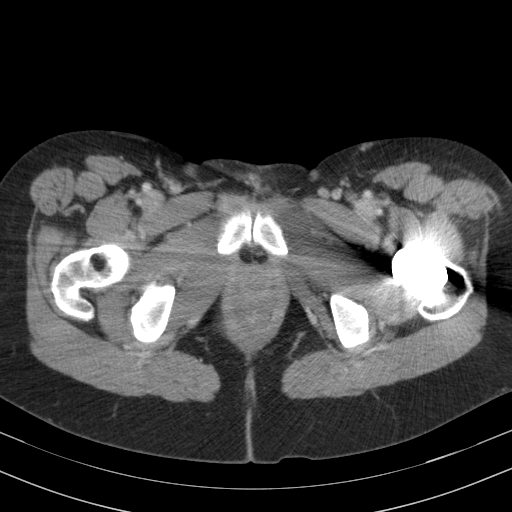
[im 25/91  soft-tissue]
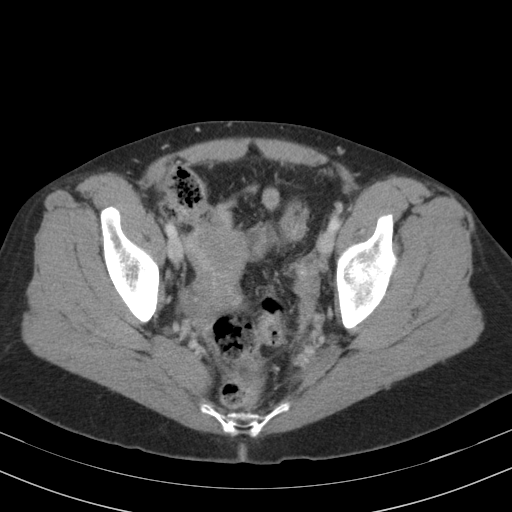
[im 31/91  soft-tissue]
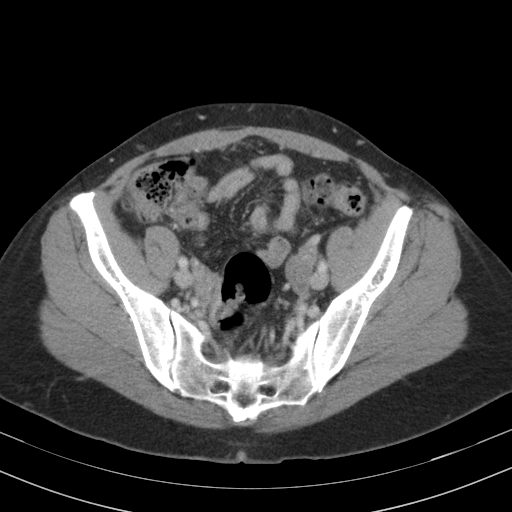
[im 37/91  soft-tissue]
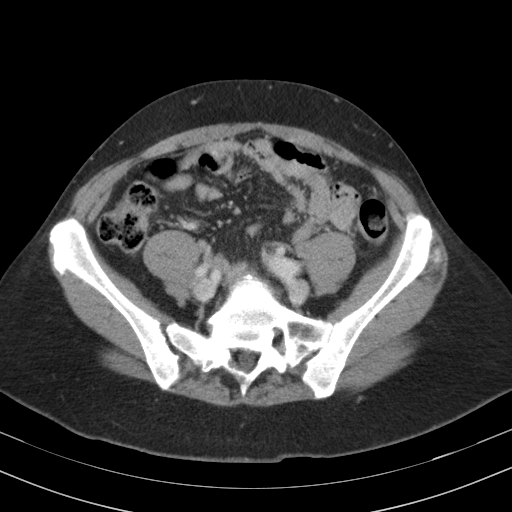
[im 49/91  soft-tissue]
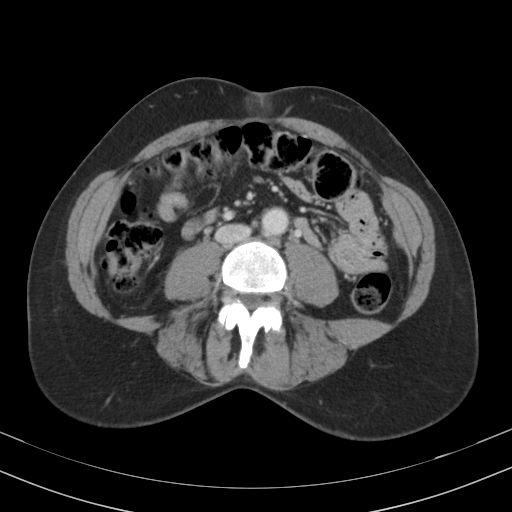
[im 55/91  soft-tissue]
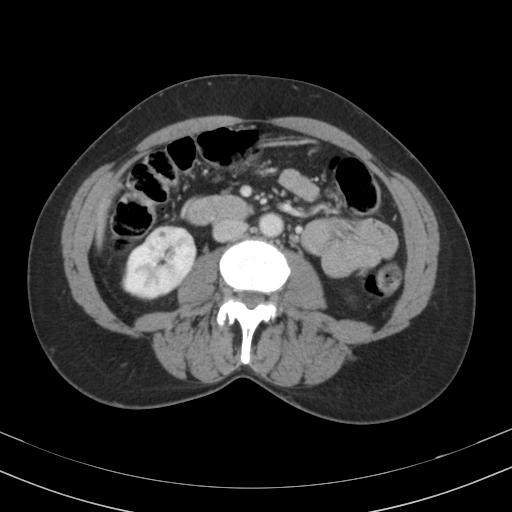
[im 61/91  soft-tissue]
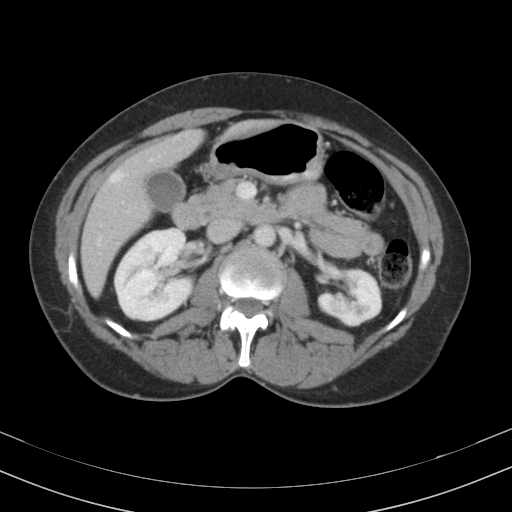
[im 67/91  soft-tissue]
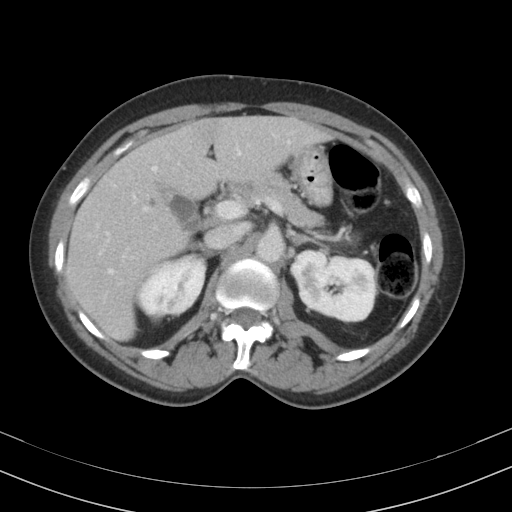
[im 67/91  lung]
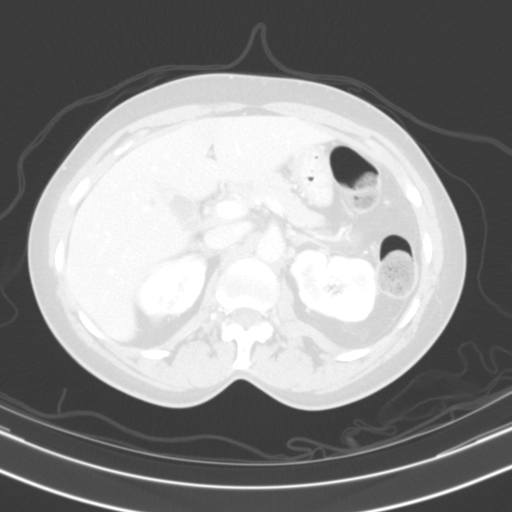
[im 67/91  bone]
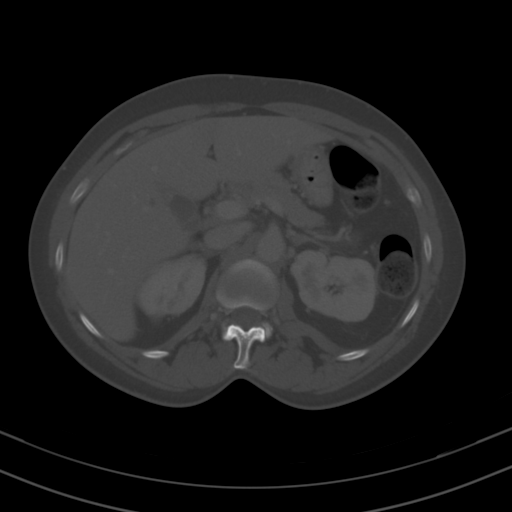
[im 73/91  lung]
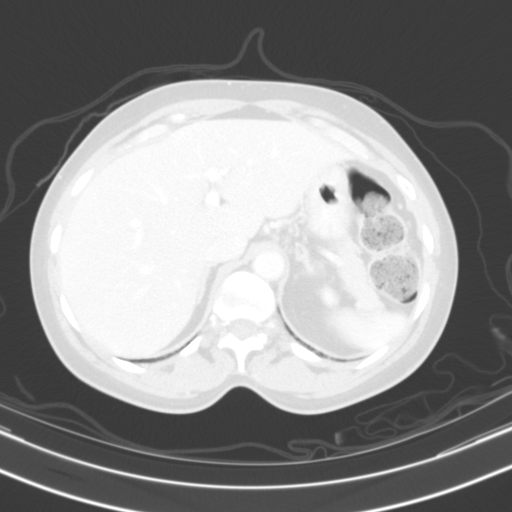
[im 79/91  soft-tissue]
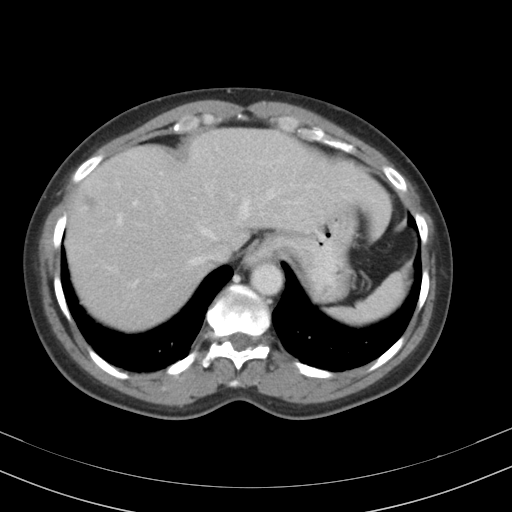
[im 79/91  lung]
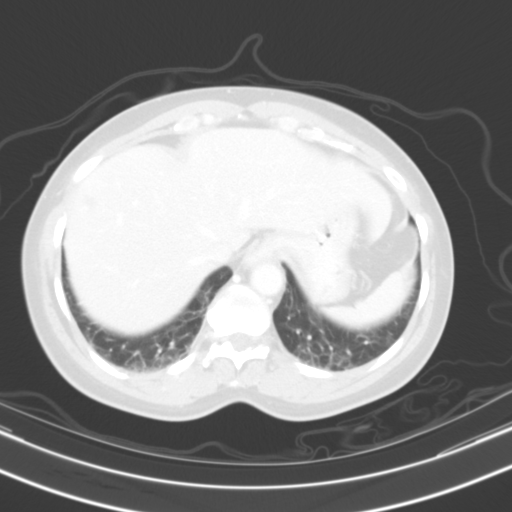
[im 85/91  soft-tissue]
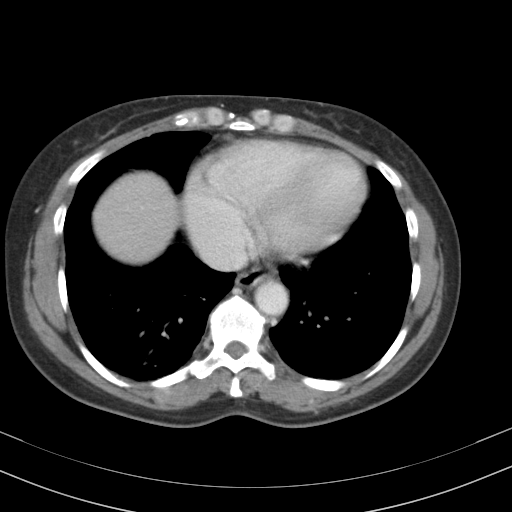
[im 85/91  lung]
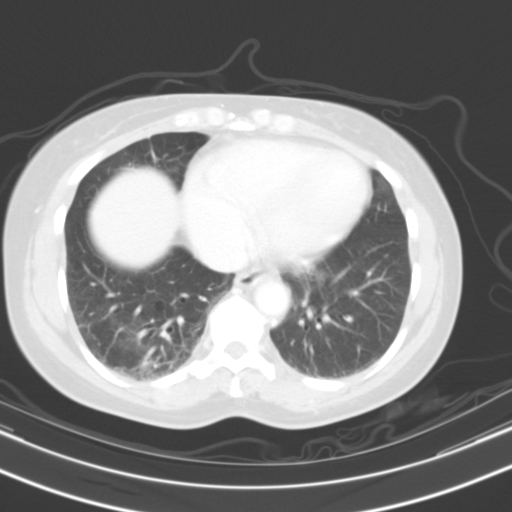

[12 of 32 positions shown; findings below may reference images not displayed]

FINDINGS: Lower chest: Mild dependent hypoventilatory changes at the lung
bases.

Hepatobiliary: Normal liver size. Segment 8 right liver lobe 2.0 cm
mass (series 2/image 15) is stable in size since 11/30/2015 chest CT
and demonstrates discontinuous peripheral nodular enhancement,
compatible with a benign hemangioma. Hyperenhancing 0.9 cm mass at
the right liver dome (series 2/ image 9) is stable since 11/30/2015
chest CT and compatible with a benign flash filling hemangioma.
Hyperenhancing 1.3 cm liver mass in the anterior inferior right
liver lobe (series 2/image 30), for which no prior comparison
imaging exists. Subcentimeter hypodense liver lesion in the
posterior right liver lobe (series 2/ image 21). No additional liver
lesions. Normal gallbladder with no radiopaque cholelithiasis. No
biliary ductal dilatation.

Pancreas: Normal, with no mass or duct dilation.

Spleen: Normal size. No mass.

Adrenals/Urinary Tract: Normal adrenals. No hydronephrosis .
Multifocal moderate left and mild right renal cortical scarring.
Scattered subcentimeter hypodense renal cortical lesions in both
kidneys are too small to characterize and require no further
follow-up. Ureters are normal caliber, with no evidence of ureteral
leak on the delayed sequence. Limited bladder visualization due to
streak artifact from the left hip hardware, with no gross bladder
abnormality.

Stomach/Bowel: Probable small hiatal hernia. Otherwise collapsed and
grossly normal stomach. Normal caliber small bowel with no small
bowel wall thickening. Normal appendix. Minimal sigmoid
diverticulosis, with no large bowel wall thickening or significant
pericolonic fat stranding.

Vascular/Lymphatic: Normal caliber abdominal aorta. Patent portal,
splenic, hepatic and renal veins. No pathologically enlarged lymph
nodes in the abdomen or pelvis.

Reproductive: Status post hysterectomy. There is a 5.4 x 3.9 x
cm right adnexal mass (series 2/image 69), which demonstrates
heterogeneous hypodensity. Simple 1.3 cm right ovarian cyst. No left
adnexal lesions. No fluid collections at the hysterectomy margin.

Other: No pneumoperitoneum, ascites or focal fluid collection.

Musculoskeletal: No aggressive appearing focal osseous lesions.
Partially visualized left total hip arthroplasty.
IMPRESSION: 1. Interval hysterectomy. Solid 5.4 x 3.9 x 3.6 cm right adnexal
mass with heterogeneous hypodensity. Based on comparison with the
preoperative 02/03/2016 pelvic ultrasound study, this may represent
a remnant fibroid within the right broad ligament. Consider acute
degeneration/infarction of a broad ligament fibroid as a potential
source of the patient's pain. Correlation with transabdominal pelvic
ultrasound may be useful to assess for vascularity within this mass
(the absence of which would support this diagnosis) .
2. No evidence of urinary tract injury. No hydronephrosis. No fluid
collections. No free air.
3. No evidence of bowel obstruction or acute bowel inflammation.
4. Hyperenhancing 1.3 cm inferior right liver lobe mass, for which
no prior comparison exists, probably a benign lesion such as a flash
filling hemangioma. Recommend attention on a follow-up MRI abdomen
without and with IV contrast in 3-6 months. This recommendation
follows ACR consensus guidelines: Managing Incidental Findings on
Abdominal CT: White Paper of the ACR Incidental Findings Committee.
[HOSPITAL] 0292;[DATE].
5. Bilateral renal cortical scarring, asymmetrically prominent in
the left kidney.

## 2018-07-15 ENCOUNTER — Telehealth: Payer: Self-pay

## 2018-07-15 NOTE — Telephone Encounter (Signed)
Returned call to patient. No answer. Left message on VM requesting return call. patient will need to schedule appt with PCP for F2F. Hubbard Hartshorn, RN, BSN

## 2018-07-15 NOTE — Telephone Encounter (Signed)
Requesting to speak with a nurse about getting a commode. Please call pt back.

## 2018-07-15 NOTE — Telephone Encounter (Signed)
Requesting test result. Please call pt back.  

## 2018-07-15 NOTE — Telephone Encounter (Signed)
Pt is calling back to speak with a nurse. 

## 2018-07-16 NOTE — Telephone Encounter (Signed)
Returned call to patient. She is requesting an elevated toilet seat. Scheduled patient in [facility] on 07/18/2018 at 0915 to discuss this as well as continued SHOB. Hubbard Hartshorn, RN, BSN

## 2018-07-16 NOTE — Telephone Encounter (Signed)
Will call patient later today. Thank you!

## 2018-07-16 NOTE — Telephone Encounter (Signed)
Called patient about separate matter. She states she is still SHOB. Informed patient that provider will call her today with her test results. F/U appt made in [facility] for 07/18/2018 at 0915. Hubbard Hartshorn, RN, BSN

## 2018-07-16 NOTE — Telephone Encounter (Signed)
Thank you!  Jean [name] 

## 2018-07-17 NOTE — Telephone Encounter (Signed)
I have called [name] several times yesterday and today and there was no answer. If she calls back, please inform her that her pulmonary function tests were inconclusive and do not show a cause for her chronic shortness of breath. She can discuss this further tomorrow during her [facility] appt. I am also happy to refer her to pulm for further evaluation.

## 2018-07-18 ENCOUNTER — Ambulatory Visit: Payer: Medicaid Other

## 2018-07-21 ENCOUNTER — Encounter: Payer: Self-pay | Admitting: Internal Medicine

## 2018-07-21 ENCOUNTER — Ambulatory Visit: Payer: Medicaid Other | Admitting: Internal Medicine

## 2018-07-21 ENCOUNTER — Other Ambulatory Visit: Payer: Self-pay

## 2018-07-21 VITALS — BP 119/74 | HR 74 | Temp 98.1°F | Ht 64.0 in | Wt 151.8 lb

## 2018-07-21 DIAGNOSIS — Z96642 Presence of left artificial hip joint: Secondary | ICD-10-CM | POA: Diagnosis not present

## 2018-07-21 DIAGNOSIS — R0609 Other forms of dyspnea: Secondary | ICD-10-CM

## 2018-07-21 DIAGNOSIS — M7062 Trochanteric bursitis, left hip: Secondary | ICD-10-CM | POA: Diagnosis not present

## 2018-07-21 DIAGNOSIS — R0600 Dyspnea, unspecified: Secondary | ICD-10-CM | POA: Diagnosis not present

## 2018-07-21 DIAGNOSIS — M7061 Trochanteric bursitis, right hip: Secondary | ICD-10-CM | POA: Diagnosis not present

## 2018-07-21 DIAGNOSIS — Z9181 History of falling: Secondary | ICD-10-CM | POA: Diagnosis not present

## 2018-07-21 NOTE — Assessment & Plan Note (Addendum)
History of present illness: Patient had pulmonary function tests at the end of February and asking for results.  Assessment: Dyspnea on exertion Evaluated in our clinic 2/17 for dyspnea on exertion and PFTs ordered.  Results of PFT were inconclusive and patient may benefit from cardiopulmonary testing. At this time will refer to pulmonology.  Plan Referral to pulmonology

## 2018-07-21 NOTE — Progress Notes (Signed)
   CC: follow-up on pulmonary function test results and need for shower chair and elevated toilet seat  HPI:  [name] is a 53 y.o. female with history noted below that presents to the acute care clinic for results of pulmonary function test.  She is also asking for shower chair and elevated toilet seat.  Please see problem based charting for the status of patient's chronic medical conditions.  Past Medical History:  Diagnosis Date  . Adnexal mass 2019   likely remnant fibroid on broad ligament; getting diagnostic lap  . Anxiety   . Arthritis    hip  . Asthma   . Carpal tunnel syndrome    bilateral  . Chronic lower back pain   . Depression   . Dyspnea    with exertion  . GERD (gastroesophageal reflux disease)   . Headache    migraines  . Herpes   . Hypertension   . Liver lesion, right lobe 12/01/2015   CT chest without contrast 11/30/15 with hepatic lesions measuring 2.0 x 1.7 cm of the right lobe and 1 x 1 cm on the dome. CT abd/pel 8/18: 1.3cm inferior R liver lobe, prob benign but recommend 3-30mo f/u with MRI w/wo IV contrast with attenuation MRI 6/19: combination of benign cavernous hemangiomas and small benign cysts  . Poor dental hygiene    chipped upper front and several loose teeth  . Substance abuse ([place])    cocaine/crack cocaine - last use 02/07/18  . SVD (spontaneous vaginal delivery)    x 1  . Uses walker    for ambulation  . Uterine leiomyoma 02/16/2016   s/p vaginal hysterectomy 2018    Review of Systems:  Review of Systems  Constitutional: Negative for chills and fever.  Respiratory: Positive for shortness of breath.   Cardiovascular: Negative for chest pain.  Musculoskeletal: Positive for falls.     Physical Exam:  Vitals:   07/21/18 1356  BP: 119/74  Pulse: 74  Temp: 98.1 F (36.7 C)  TempSrc: Oral  SpO2: 100%  Weight: 151 lb 12.8 oz (68.9 kg)  Height: 5\' 4"  (1.626 m)   Physical Exam  Constitutional: She is well-developed,  well-nourished, and in no distress.  Uses walker  Cardiovascular: Normal rate, regular rhythm and normal heart sounds. Exam reveals no gallop and no friction rub.  No murmur heard. Pulmonary/Chest: Effort normal and breath sounds normal. No respiratory distress. She has no wheezes. She has no rales.  Musculoskeletal:        General: No edema.     Assessment & Plan:   See encounters tab for problem based medical decision making.   Patient discussed with Dr. Evette Doffing

## 2018-07-21 NOTE — Patient Instructions (Signed)
Jean [name],  It was a pleasure meeting you today. A referral has been made to to a lung doctor. Please call the clinic with any questions or concerns.

## 2018-07-21 NOTE — Assessment & Plan Note (Addendum)
History of present illness:  Patient states that she has fallen 3 times over the past several months getting in and out of the shower.  She states that she had a left hip replacement in 2017 and since then has had trouble walking. She's had physical therapy in the past and does not desire to continue. She also needs help getting up from a seated position especially when she is on the toilet.   Assessment: Abnormal gait due to hip replacement and chronic bilateral trochanteric bursitis Patient has a history of left hip replacement and has to use a walker to ambulate. She also sees orthopedic surgery for bilateral trochanteric bursitis for which she has received cortisone injections.  Patient would benefit from shower chair and elevated toilet seat to help minimize falls.  Plan  dme order for Shower chair, Toilet seat, Pull up bar  Addendum:  Patient has requested personal care services and with previous medical history reviewed I believe patient would benefit from extra assistance with her ADLs due to her gait instability due to left hip pain s/p left hip replacement.

## 2018-07-22 NOTE — Progress Notes (Signed)
Internal Medicine Clinic Attending  Case discussed with Dr. Hoffman at the time of the visit.  We reviewed the resident's history and exam and pertinent patient test results.  I agree with the assessment, diagnosis, and plan of care documented in the resident's note.  

## 2018-07-22 NOTE — Telephone Encounter (Signed)
Left message for patient requesting return call. Will need to ask if she has any preference for supply company to provide elevated toilet seat, shower chair and pull up bar ordered yesterday. Hubbard Hartshorn, RN, BSN

## 2018-07-23 ENCOUNTER — Telehealth: Payer: Self-pay

## 2018-07-23 NOTE — Telephone Encounter (Signed)
Pt calls and states she would like a HH AIDE, she has been having falls and gets tired bathing and dressing by self, it makes her weak. Has no one to help her

## 2018-07-23 NOTE — Telephone Encounter (Signed)
Requesting to speak with a nurse. Please call back.  

## 2018-07-23 NOTE — Telephone Encounter (Signed)
Lauren, what do I need to do to start the process for obtaining a [place] aide with Medicaid?  Thank you!  Estill Dooms

## 2018-07-25 NOTE — Telephone Encounter (Signed)
Received confirmation from [place]. She has these orders and can have them delivered today. Hubbard Hartshorn, RN, BSN

## 2018-07-25 NOTE — Telephone Encounter (Signed)
Patient has no preference for supply company. Order, demographics, OV notes from 07/21/2018 and insurance card faxed to [facility] supply at [phone]. Hubbard Hartshorn, RN, BSN

## 2018-07-28 NOTE — Telephone Encounter (Signed)
Paperwork started and placed in Dr. Jodene Nam box to fill out Step 5. Thank you all for your help!  Estill Dooms

## 2018-07-28 NOTE — Telephone Encounter (Signed)
I am happy to fill out the PCS form.  I do not know the patient well and she may need to return for another visit to complete this.  If Dr. Jari Favre knows the patient and feels comfortable filling out the F2F form that may be better.

## 2018-07-30 NOTE — Telephone Encounter (Signed)
Completed and signed Request for Independent Assessment for [place] of Medical Need faxed to [facility] at [phone].  Hubbard Hartshorn, RN, BSN

## 2018-07-30 NOTE — Telephone Encounter (Signed)
Ok note has been addended and paper work Visual merchandiser.

## 2018-08-13 ENCOUNTER — Ambulatory Visit: Payer: Medicaid Other | Admitting: Neurology

## 2018-08-18 ENCOUNTER — Telehealth: Payer: Self-pay

## 2018-08-18 ENCOUNTER — Other Ambulatory Visit: Payer: Self-pay | Admitting: Internal Medicine

## 2018-08-18 DIAGNOSIS — G894 Chronic pain syndrome: Secondary | ICD-10-CM

## 2018-08-18 NOTE — Telephone Encounter (Signed)
If pt calls back pt does not need r/s we manage two of her medications.  Tried to call pt cell number vm was full. I left number in her phone message to call back.IF pt calls back we are only doing video visit no in office visit due to [place] 19. We need her email address and consent to do video visit and to file insurance.Also give her [email or web address].

## 2018-08-26 ENCOUNTER — Other Ambulatory Visit: Payer: Self-pay

## 2018-08-26 ENCOUNTER — Ambulatory Visit (INDEPENDENT_AMBULATORY_CARE_PROVIDER_SITE_OTHER): Payer: Medicaid Other | Admitting: Neurology

## 2018-08-26 DIAGNOSIS — G44209 Tension-type headache, unspecified, not intractable: Secondary | ICD-10-CM | POA: Diagnosis not present

## 2018-08-26 MED ORDER — DIVALPROEX SODIUM ER 250 MG PO TB24: 750.0000 mg | ORAL_TABLET | Freq: Every day | ORAL | 3 refills | Status: DC

## 2018-08-26 NOTE — Progress Notes (Signed)
Virtual Visit via Telephone Note  I connected with Jean [name] on 08/26/18 at 10:00 AM EDT by telephone and verified that I am speaking with the correct person using two identifiers.   I discussed the limitations, risks, security and privacy concerns of performing an evaluation and management service by telephone and the availability of in person appointments. I also discussed with the patient that there may be a patient responsible charge related to this service. The patient expressed understanding and agreed to proceed.   History of Present Illness: This is a virtual telephone follow-up visit after last office visit on 02/10/2018 with Jean [name], nurse practitioner.  Patient states that the headaches are somewhat better but she still gets 2 or more headaches a week.  These are noticed mostly when she wakes up.  She describes a temporal mild to moderate headache which is pressure-like in quality and increased with physical activity.  She denies light sensitivity or nausea or vomiting.  She does take Depakote ER 500 mg which seems to help.  She is tolerating it well without any side effects.  She also occasionally needs to take Tylenol about twice a week or so which also helps.  She takes Neurontin 300 mg at night.  She is not currently participating in any activities for stress relaxation.  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 [name] on 07/08/2018.  She did have good initial relief of her neck and left arm pain but now the pain seems to be coming back.  She has no new neurological complaints.    Observations/Objective: Physical and neurological exam was not possible due to constraints of virtual telephonic visit  Assessment and Plan: 53 year old lady with chronic headaches mixed migraine with tension headaches.  She seems to have obtained modest response to Depakote.   Follow Up Instructions: Recommend increase Depakote dose to 750 mg daily.  She was  advised to limit taking Tylenol and over-the-counter pain medications to less than 2 days a week to avoid rebound.  I encouraged her to increase participation in stress relaxation activities like regular exercise, swimming, meditation and yoga.  She was advised to return for follow-up in 2 months with Jean [name] nurse practitioner or call earlier if necessary   I discussed the assessment and treatment plan with the patient. The patient was provided an opportunity to ask questions and all were answered. The patient agreed with the plan and demonstrated an understanding of the instructions.   The patient was advised to call back or seek an in-person evaluation if the symptoms worsen or if the condition fails to improve as anticipated.  I provided 22 minutes of non-face-to-face time during this encounter.   Antony Contras, MD

## 2018-08-27 ENCOUNTER — Telehealth: Payer: Self-pay | Admitting: Internal Medicine

## 2018-08-27 NOTE — Telephone Encounter (Signed)
Pt requesting a call back in reference to a PCS letter she rec'd in the mail.

## 2018-08-28 ENCOUNTER — Other Ambulatory Visit: Payer: Self-pay | Admitting: Internal Medicine

## 2018-08-28 DIAGNOSIS — Z96642 Presence of left artificial hip joint: Secondary | ICD-10-CM

## 2018-08-28 NOTE — Telephone Encounter (Signed)
I returned call to patient about a letter she received in the mail.The Letter was a Denial letter for [facility] services. The patient is having problems with mobility issues,patient has shower chair elevated toilet seat,even with these things she is needing help on how to use equipment safely [place], [place] C4/23/20202:09 PM

## 2018-09-03 ENCOUNTER — Telehealth: Payer: Self-pay | Admitting: *Deleted

## 2018-09-03 ENCOUNTER — Telehealth: Payer: Self-pay | Admitting: Neurology

## 2018-09-03 MED ORDER — FAMOTIDINE 20 MG PO TABS: 20.0000 mg | ORAL_TABLET | Freq: Every day | ORAL | 2 refills | Status: DC | PRN

## 2018-09-03 NOTE — Telephone Encounter (Signed)
Received fax from [facility] requesting alternative for ranitidine as this med has been recalled and no longer on market. Hubbard Hartshorn, RN, BSN

## 2018-09-03 NOTE — Telephone Encounter (Signed)
2 VM left for pt to call back and schedule f/u  °

## 2018-09-03 NOTE — Telephone Encounter (Signed)
I don't see an indication for this med on her problem list. Will defer to Dr. Jari Favre to change her medication as needed

## 2018-09-10 ENCOUNTER — Other Ambulatory Visit: Payer: Self-pay | Admitting: Internal Medicine

## 2018-09-10 DIAGNOSIS — Z96642 Presence of left artificial hip joint: Secondary | ICD-10-CM

## 2018-09-10 NOTE — Progress Notes (Unsigned)
mbulat

## 2018-09-11 NOTE — Telephone Encounter (Addendum)
Referral for [facility] PT/OT received. Patient has no preference for [place]. Spoke with Joen Laura, RN with [facility] who is able to take patient. Will call back with SOC date. Hubbard Hartshorn, RN, BSN

## 2018-09-18 ENCOUNTER — Telehealth: Payer: Self-pay | Admitting: Internal Medicine

## 2018-09-18 NOTE — Telephone Encounter (Signed)
Rec'd phone call from [facility] PT requesting VO.

## 2018-09-18 NOTE — Telephone Encounter (Signed)
Agree, thank you!  Jean [name] 

## 2018-09-18 NOTE — Telephone Encounter (Signed)
VO for PT HH  2x week for 4 weeks For balance, safety, strengthening, ambulation, gait training Do you agree?

## 2018-09-18 NOTE — Telephone Encounter (Signed)
Spoke with Tiffany at [facility]. States SOC for [facility] PT was 09/17/2018. Hubbard Hartshorn, RN, BSN

## 2018-09-24 ENCOUNTER — Telehealth: Payer: Self-pay

## 2018-09-24 DIAGNOSIS — G894 Chronic pain syndrome: Secondary | ICD-10-CM

## 2018-09-24 NOTE — Telephone Encounter (Signed)
Jean [name] with [facility] requesting VO for OT and transfer tub bench to [facility]. Please call back.

## 2018-09-25 NOTE — Telephone Encounter (Addendum)
Jeani Hawking called back stating Medicaid will not cover transfer bench since they just paid for shower chair and it cannot be exchanged as it's been over 30 days. Out of pocket cost is $121.99. Jeani Hawking will notify patient. Izora Gala, OT had mentioned that if medicaid would not cover cost she will continue to work with patient using shower chair safely. Hubbard Hartshorn, RN, BSN

## 2018-09-25 NOTE — Telephone Encounter (Signed)
Placed call to [facility] [employer]. She will look into whether medicaid will cover cost of tub transfer bench since patient recently received shower chair. Awaiting reply. Hubbard Hartshorn, RN, BSN

## 2018-09-25 NOTE — Telephone Encounter (Signed)
Agree with verbal orders for [facility] OT. Order placed for tub transfer bench.  Alphonzo Grieve

## 2018-09-25 NOTE — Telephone Encounter (Signed)
Jean [name] returned call. Verbal auth given to extend [facility] OT for 1 week 4 to work on Advice worker. Jean [name] states that patient is unable to lift leg over tub to sit on shower stool and had a fall related to the shower stool. Jean [name] is requesting an order for a tub transfer bench. Hubbard Hartshorn, RN, BSN

## 2018-09-25 NOTE — Telephone Encounter (Signed)
Returned call to [place], [place]. No answer. Left message on VM requesting return call. Hubbard Hartshorn, RN, BSN

## 2018-10-01 ENCOUNTER — Other Ambulatory Visit: Payer: Self-pay | Admitting: Internal Medicine

## 2018-10-01 DIAGNOSIS — R0609 Other forms of dyspnea: Secondary | ICD-10-CM

## 2018-10-01 DIAGNOSIS — F4321 Adjustment disorder with depressed mood: Secondary | ICD-10-CM

## 2018-10-01 NOTE — Telephone Encounter (Signed)
sertraline (ZOLOFT) 50 MG tablet   PROVENTIL HFA 108 (90 Base) MCG/ACT inhaler, REFILL REQUEST @  [facility] [place], [place], [place] [phone] (Phone) [phone] (Fax)

## 2018-10-16 ENCOUNTER — Encounter: Payer: Self-pay | Admitting: *Deleted

## 2018-10-21 ENCOUNTER — Ambulatory Visit: Payer: Medicaid Other | Admitting: Physician Assistant

## 2018-10-23 ENCOUNTER — Other Ambulatory Visit: Payer: Self-pay

## 2018-10-23 ENCOUNTER — Ambulatory Visit (INDEPENDENT_AMBULATORY_CARE_PROVIDER_SITE_OTHER): Payer: Medicaid Other

## 2018-10-23 ENCOUNTER — Encounter: Payer: Self-pay | Admitting: Physician Assistant

## 2018-10-23 ENCOUNTER — Ambulatory Visit (INDEPENDENT_AMBULATORY_CARE_PROVIDER_SITE_OTHER): Payer: Medicaid Other | Admitting: Physician Assistant

## 2018-10-23 DIAGNOSIS — M792 Neuralgia and neuritis, unspecified: Secondary | ICD-10-CM | POA: Diagnosis not present

## 2018-10-23 DIAGNOSIS — M7061 Trochanteric bursitis, right hip: Secondary | ICD-10-CM

## 2018-10-23 DIAGNOSIS — M79601 Pain in right arm: Secondary | ICD-10-CM

## 2018-10-23 MED ORDER — METHYLPREDNISOLONE ACETATE 40 MG/ML IJ SUSP
40.0000 mg | INTRAMUSCULAR | Status: AC | PRN
  Administered 2018-10-23: 40 mg via INTRA_ARTICULAR

## 2018-10-23 MED ORDER — LIDOCAINE HCL 1 % IJ SOLN
3.0000 mL | INTRAMUSCULAR | Status: AC | PRN
  Administered 2018-10-23: 11:00:00 3 mL

## 2018-10-23 NOTE — Progress Notes (Signed)
Office Visit Note   Patient: Jean [name]           Date of Birth: 30-Jan-1966           MRN: [id number] Visit Date: 10/23/2018              Requested by: Alphonzo Grieve, MD [address] [place],  [place] [address] PCP: Alphonzo Grieve, MD   Assessment & Plan: Visit Diagnoses:  1. Trochanteric bursitis of right hip   2. Radicular pain in right arm     Plan: She will work work on IT band stretching exercises as shown today.  We will have her undergo EMG nerve conduction studies to rule out cervical radiculopathy versus carpal tunnel syndrome as the radicular pain in her right arm.  Questions encouraged and answered at length.  She will follow-up after the EMG nerve conduction studies of the upper extremities.  Follow-Up Instructions: Return for After EMG nerve conduction studies.   Orders:  Orders Placed This Encounter  Procedures   Large Joint Inj: R greater trochanter   XR HIP UNILAT W OR W/O PELVIS 1V RIGHT   XR Shoulder Right   No orders of the defined types were placed in this encounter.     Procedures: Large Joint Inj: R greater trochanter on 10/23/2018 11:01 AM Indications: pain Details: 22 G 1.5 in needle, lateral approach  Arthrogram: No  Medications: 3 mL lidocaine 1 %; 40 mg methylPREDNISolone acetate 40 MG/ML Outcome: tolerated well, no immediate complications Procedure, treatment alternatives, risks and benefits explained, specific risks discussed. Consent was given by the patient. Immediately prior to procedure a time out was called to verify the correct patient, procedure, equipment, support staff and site/side marked as required. Patient was prepped and draped in the usual sterile fashion.       Clinical Data: No additional findings.   Subjective: Chief Complaint  Patient presents with   Right Shoulder - Pain   Right Hip - Pain    HPI Mrs. Jean [name] returns today due to right shoulder pain and right arm pain is been ongoing for at least  the past 2 months.  States that the pain radiates up from her hand into her shoulder.  Said no new injury.  She had an appointment but missed the appointment for EMG nerve conduction studies rule out carpal tunnel syndrome.  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 History of left total hip arthroplasty 2017.  States that her left hip is doing well.  She is having pain in her right hip some pain lateral aspect of the hip particularly with lying on the hip.  Also has some groin pain.  No known injury.  She has had injections for trochanteric bursitis in the past which she states is helped. Review of Systems Denies any fevers or chills.  Objective: Vital Signs: LMP  (LMP Unknown)   Physical Exam Constitutional:      Appearance: She is not ill-appearing or diaphoretic.     Comments: Patient seems extremely anxious and regards her shoulder and hip.  Neurological:     Mental Status: She is oriented to person, place, and time.  Psychiatric:        Mood and Affect: Mood is anxious.     Ortho Exam Bilateral hips good range of motion.  She has slight discomfort with external rotation of the  right hip.  Tenderness over the trochanteric region of both hips right greater than left. Cervical spine she has good range of motion cervical spine without pain.  Negative Spurling's.  She has tenderness along medial border of the right scapula. Bilateral upper extremities she has full range of motion of both shoulders with extreme guarding on the right.  Out of 5 strength with external and internal rotation both shoulders against resistance empty can test negative bilaterally.  Impingement testing is negative but again significant guarding of the right shoulder with these maneuvers.  Full motor bilateral hands.  Full sensation subjectively bilateral hands.  Radial pulses are  2+.  She has a positive Tinel's on the right median nerve.  Positive Phalen's on the right.  No thenar atrophy of either hand. Specialty Comments:  No specialty comments available.  Imaging: Xr Hip Unilat W Or W/o Pelvis 1v Right  Result Date: 10/23/2018 AP pelvis lateral view of the right hip: Shows patient be status post left total hip arthroplasty well-seated components.  Right hip is well located.  Hip joint appears well preserved.  No acute fractures.  Xr Shoulder Right  Result Date: 10/23/2018 3 views right shoulder: No acute findings.  Shoulders well located.  Glenohumeral joint appears well preserved.    PMFS History: Patient Active Problem List   Diagnosis Date Noted   Breast pain 06/23/2018   Post-operative state 03/26/2018   Vaginal discharge 03/26/2018   Dyspareunia in female 09/13/2017   Cervicalgia 04/24/2017   CKD (chronic kidney disease) 10/29/2016   Chest wall pain 10/15/2016   Chronic pain syndrome 05/10/2016   MDD (major depressive disorder) 10/19/2015   Dyspnea on exertion 08/22/2015   HTN (hypertension) 07/08/2015   Routine health maintenance 07/08/2015   Tobacco abuse 07/08/2015   Past Medical History:  Diagnosis Date   Adnexal mass 2019   likely remnant fibroid on broad ligament; getting diagnostic lap   Anxiety    Arthritis    hip   Asthma    Carpal tunnel syndrome    bilateral   Chronic lower back pain    Depression    Dyspnea    with exertion   GERD (gastroesophageal reflux disease)    Headache    migraines   Herpes    Hypertension    Liver lesion, right lobe 12/01/2015   CT chest without contrast 11/30/15 with hepatic lesions measuring 2.0 x 1.7 cm of the right lobe and 1 x 1 cm on the dome. CT abd/pel 8/18: 1.3cm inferior R liver lobe, prob benign but recommend 3-53mo f/u with MRI w/wo IV contrast with attenuation MRI 6/19: combination of benign cavernous hemangiomas and small benign cysts   Poor dental hygiene      chipped upper front and several loose teeth   Substance abuse ([place])    cocaine/crack cocaine - last use 02/07/18   SVD (spontaneous vaginal delivery)    x 1   Uses walker    for ambulation   Uterine leiomyoma 02/16/2016   s/p vaginal hysterectomy 2018    Family History  Problem Relation Age of Onset   Cancer Mother    Hypertension Mother    Diabetes Mother    Breast cancer Mother        diagnosed in her 75's   Stroke Father     Past Surgical History:  Procedure Laterality Date   LAPAROSCOPIC OVARIAN CYSTECTOMY Right 02/25/2018   Procedure: LAPAROSCOPIC REMOVAL OF RIGHT ADNEXAL MASS;  Surgeon: Chancy Milroy, MD;  Location: [facility];  Service: Gynecology;  Laterality: Right;   SALPINGECTOMY Left    lap - ectopic preg   TOTAL HIP ARTHROPLASTY Left 01/20/2016   Procedure: LEFT TOTAL HIP ARTHROPLASTY ANTERIOR APPROACH;  Surgeon: Mcarthur Rossetti, MD;  Location: [facility];  Service: Orthopedics;  Laterality: Left;   TUBAL LIGATION     VAGINAL HYSTERECTOMY Right 12/11/2016   Procedure: HYSTERECTOMY VAGINAL WITH RIGHT SALPINGECTOMY;  Surgeon: Chancy Milroy, MD;  Location: [facility];  Service: Gynecology;  Laterality: Right;   Social History   Occupational History   Not on file  Tobacco Use   Smoking status: Current Every Day Smoker    Packs/day: 0.25    Years: 11.00    Pack years: 2.75    Types: Cigarettes   Smokeless tobacco: Never Used  Substance and Sexual Activity   Alcohol use: Not Currently    Alcohol/week: 3.0 standard drinks    Types: 3 Cans of beer per week    Comment: Occasional Beer    Drug use: No    Types: "Crack" cocaine, Cocaine    Comment: Last use 2 wks ago 02/07/18   Sexual activity: Yes    Partners: Male    Birth control/protection: None, Post-menopausal    Comment: Hysterectomy

## 2018-10-29 ENCOUNTER — Other Ambulatory Visit: Payer: Self-pay

## 2018-10-29 ENCOUNTER — Encounter: Payer: Self-pay | Admitting: Internal Medicine

## 2018-10-29 ENCOUNTER — Ambulatory Visit (INDEPENDENT_AMBULATORY_CARE_PROVIDER_SITE_OTHER): Payer: Medicaid Other

## 2018-10-29 ENCOUNTER — Ambulatory Visit (INDEPENDENT_AMBULATORY_CARE_PROVIDER_SITE_OTHER): Payer: Medicaid Other | Admitting: Internal Medicine

## 2018-10-29 DIAGNOSIS — F17210 Nicotine dependence, cigarettes, uncomplicated: Secondary | ICD-10-CM | POA: Diagnosis not present

## 2018-10-29 DIAGNOSIS — R0609 Other forms of dyspnea: Secondary | ICD-10-CM | POA: Diagnosis not present

## 2018-10-29 LAB — CBC WITH DIFFERENTIAL/PLATELET
Basophils Absolute: 0.1 10*3/uL (ref 0.0–0.1)
Basophils Relative: 0.5 % (ref 0.0–3.0)
Eosinophils Absolute: 0.1 10*3/uL (ref 0.0–0.7)
Eosinophils Relative: 1.1 % (ref 0.0–5.0)
HCT: 40.9 % (ref 36.0–46.0)
Hemoglobin: 13.6 g/dL (ref 12.0–15.0)
Lymphocytes Relative: 30.9 % (ref 12.0–46.0)
Lymphs Abs: 3.3 10*3/uL (ref 0.7–4.0)
MCHC: 33.2 g/dL (ref 30.0–36.0)
MCV: 98.6 fl (ref 78.0–100.0)
Monocytes Absolute: 0.7 10*3/uL (ref 0.1–1.0)
Monocytes Relative: 6.5 % (ref 3.0–12.0)
Neutro Abs: 6.5 10*3/uL (ref 1.4–7.7)
Neutrophils Relative %: 61 % (ref 43.0–77.0)
Platelets: 380 10*3/uL (ref 150.0–400.0)
RBC: 4.15 Mil/uL (ref 3.87–5.11)
RDW: 13.7 % (ref 11.5–15.5)
WBC: 10.7 10*3/uL — ABNORMAL HIGH (ref 4.0–10.5)

## 2018-10-29 LAB — D-DIMER, QUANTITATIVE: D-Dimer, Quant: 0.46 mcg/mL FEU (ref <0.50)

## 2018-10-29 LAB — BASIC METABOLIC PANEL
BUN: 17 mg/dL (ref 6–23)
CO2: 27 mEq/L (ref 19–32)
Calcium: 9.9 mg/dL (ref 8.4–10.5)
Chloride: 103 mEq/L (ref 96–112)
Creatinine, Ser: 1.24 mg/dL — ABNORMAL HIGH (ref 0.40–1.20)
GFR: 54.25 mL/min — ABNORMAL LOW (ref >60.00)
Glucose, Bld: 84 mg/dL (ref 70–99)
Potassium: 5 mEq/L (ref 3.5–5.1)
Sodium: 140 mEq/L (ref 135–145)

## 2018-10-29 LAB — BRAIN NATRIURETIC PEPTIDE: Pro B Natriuretic peptide (BNP): 55 pg/mL (ref 0.0–100.0)

## 2018-10-29 LAB — TSH: TSH: 1.03 u[IU]/mL (ref 0.35–4.50)

## 2018-10-29 NOTE — Assessment & Plan Note (Signed)
4-5 min discussion re active cigarette smoking in addition to office E&M  Ask about tobacco use:   ongoing Advise quitting   > 3 min discussion I reviewed the Fletcher curve with the patient that basically indicates  if you quit smoking when your best day FEV1 is still well preserved (as is clearly  the case here)  it is highly unlikely you will progress to severe disease and informed the patient there was  no medication on the market that has proven to alter the curve/ its downward trajectory  or the likelihood of progression of their disease(unlike other chronic medical conditions such as atheroclerosis where we do think we can change the natural hx with risk reducing meds)    Therefore stopping smoking and maintaining abstinence are  the most important aspects of care, not choice of inhalers or for that matter, doctors.    >>> Treatment other than smoking cessation  is entirely directed by severity of symptoms and focused also on reducing exacerbations, not attempting to change the natural history of the disease.   Since she is not limited by airflow obst or having aecopd >>> pulmonary f/u can be prn  Assess willingness:  Not committed at this point Assist in quit attempt:  Per PCP when ready Arrange follow up:   Follow up per Primary Care planned  For smoking cessation classes call [phone]

## 2018-10-29 NOTE — Progress Notes (Signed)
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 Joni [name]. Pt c/o SOB x 6 months- SOB with or without any exertion. She had PFT's in Feb 2020. She is using her albuterol inhaler several times per day.  Dyspnea:  At rest x 6 months Cough: none Sleep: fine on back / one pillow  SABA use: no better with inhaler but still uses up to bid "cause they said to"  Assoc bad heartburn  No obvious day to day or daytime variability or assoc excess/ purulent sputum or mucus plugs or hemoptysis or cp or chest tightness, subjective wheeze or overt sinus or hb symptoms.   Sleeping as above without nocturnal  or early am exacerbation  of respiratory  c/o's or need for noct saba. Also denies any obvious fluctuation of symptoms with weather or environmental changes or other aggravating or alleviating factors except as outlined above   No unusual exposure hx or h/o childhood pna/ asthma or knowledge of premature birth.  Current Allergies, Complete Past Medical History, Past Surgical History, Family History, and Social History were reviewed in [employer] record.  ROS  The following are not active complaints unless bolded Hoarseness, sore throat, dysphagia, dental problems, itching, sneezing,  nasal congestion or discharge of excess mucus or purulent secretions, ear ache,   fever, chills, sweats, unintended wt loss or wt gain,  classically pleuritic or exertional cp,  orthopnea pnd or arm/hand swelling  or leg swelling, presyncope, palpitations, abdominal pain, anorexia, nausea, vomiting, diarrhea  or change in bowel habits or change in bladder habits, change in stools or change in urine, dysuria, hematuria,  rash, arthralgias, visual complaints, headache, numbness, weakness or ataxia or problems with walking or coordination,  change in mood or  memory.           Past Medical History:  Diagnosis Date   Adnexal mass 2019   likely remnant fibroid on broad ligament; getting diagnostic lap   Anxiety    Arthritis    hip   Asthma    Carpal tunnel syndrome    bilateral   Chronic lower back pain    Depression    Dyspnea    with exertion   GERD (gastroesophageal reflux disease)    Headache    migraines   Herpes    Hypertension    Liver lesion, right lobe 12/01/2015   CT chest without contrast 11/30/15 with hepatic lesions measuring 2.0 x 1.7 cm of the right lobe and 1 x 1 cm on the dome. CT abd/pel 8/18: 1.3cm inferior R liver lobe, prob benign but recommend 3-76mo f/u with MRI w/wo IV contrast with attenuation MRI 6/19: combination of benign cavernous hemangiomas and small benign cysts   Poor dental hygiene    chipped upper front and several loose teeth   Substance abuse ([place])    cocaine/crack  cocaine - last use 02/07/18   SVD (spontaneous vaginal delivery)    x 1   Uses walker    for ambulation   Uterine leiomyoma 02/16/2016   s/p vaginal hysterectomy 2018    Outpatient Medications Prior to Visit  Medication Sig Dispense Refill   amLODipine (NORVASC) 5 MG tablet Take 1 tablet (5 mg total) by mouth daily. 90 tablet 3   cyclobenzaprine (FLEXERIL) 10 MG tablet TAKE 1 Tablet  BY MOUTH TWICE DAILY AS NEEDED FOR MUSCLE SPASMS 30 tablet 3   divalproex (DEPAKOTE ER) 250 MG 24 hr tablet Take 3 tablets (750 mg total) by mouth daily. 90 tablet 3   famotidine (PEPCID) 20 MG tablet Take 1 tablet (20  mg total) by mouth daily as needed for heartburn or indigestion. 30 tablet 2   gabapentin (NEURONTIN) 300 MG capsule Take 1 capsule (300 mg total) by mouth at bedtime. 90 capsule 1   hydrochlorothiazide (HYDRODIURIL) 25 MG tablet Take 1 tablet (25 mg total) by mouth daily. 90 tablet 3   PROVENTIL HFA 108 (90 Base) MCG/ACT inhaler INHALE 2 PUFFS BY MOUTH EVERY 6 HOURS AS NEEDED FOR COUGHING, WHEEZING, OR SHORTNESS OF BREATH 6.7 g 2   sertraline (ZOLOFT) 100 MG tablet TAKE 1 Tablet BY MOUTH ONCE DAILY 30 tablet 2   tetrahydrozoline 0.05 % ophthalmic solution Place 1 drop into both eyes daily as needed (for dry eyes).      metroNIDAZOLE (FLAGYL) 500 MG tablet Take 1 tablet (500 mg total) by mouth 2 (two) times daily. 14 tablet 0      Objective:     BP 124/90 (BP Location: Left Arm, Cuff Size: Normal)    Pulse 69    Temp 98 F (36.7 C) (Oral)    Ht 5\' 4"  (1.626 m)    Wt 146 lb (66.2 kg)    LMP  (LMP Unknown)    SpO2 97%    BMI 25.06 kg/m   SpO2: 97 %  RA  amb bf with 2 wheeled walker  HEENT: nl   turbinates bilaterally, and oropharynx. Nl external ear canals without cough reflex   NECK :  without JVD/Nodes/TM/ nl carotid upstrokes bilaterally   LUNGS: no acc muscle use,  Nl contour chest which is clear to A and P bilaterally without cough on insp or exp maneuvers   CV:  RRR  no s3 or murmur or increase in P2, and no edema   ABD:  soft and nontender with nl inspiratory excursion in the supine position. No bruits or organomegaly appreciated, bowel sounds nl  MS:   ext warm without obvious deformities, calf tenderness, cyanosis or clubbing   SKIN: warm and dry without lesions    NEURO:  alert, approp, nl sensorium with  no motor or cerebellar deficits apparent.    CXR PA and Lateral:   10/29/2018 :    I personally reviewed images and agree with radiology impression as follows:   No acute cardiopulmonary process.    Labs ordered/ reviewed:      Chemistry        Component Value Date/Time   NA 140 10/29/2018 1048   NA 140 02/10/2018 1203   K 5.0 10/29/2018 1048   CL 103 10/29/2018 1048   CO2 27 10/29/2018 1048   BUN 17 10/29/2018 1048   BUN 12 02/10/2018 1203   CREATININE 1.24 (H) 10/29/2018 1048      Component Value Date/Time   CALCIUM 9.9 10/29/2018 1048   ALKPHOS  96 02/10/2018 1203   AST 15 02/10/2018 1203   ALT 11 02/10/2018 1203   BILITOT <0.2 02/10/2018 1203        Lab Results  Component Value Date   WBC 10.7 (H) 10/29/2018   HGB 13.6 10/29/2018   HCT 40.9 10/29/2018   MCV 98.6 10/29/2018   PLT 380.0 10/29/2018       EOS                                                               0.1                                    10/29/2018   Lab Results  Component Value Date   DDIMER 0.46 10/29/2018      Lab Results  Component Value Date   TSH 1.03 10/29/2018     Lab Results  Component Value Date   PROBNP 55.0 10/29/2018            Assessment   Dyspnea on exertion Onset 2016  - PFT's  10/29/2018  FEV1 2.08 (93 % ) ratio 0.84  p 0 % improvement from saba p ? prior to study with DLCO  78 % corrects to 93 % for alv volume and 20% improvement in FVC p saba with incomplete exhalation  Despite smoking hx she does not meet criteria for copd dx and Symptoms are markedly disproportionate to objective findings and not clear to what extent this is actually a pulmonary  problem but pt does appear to have difficult to sort out respiratory symptoms of unknown origin for which  DDX  = almost all start with A and  include Adherence, Ace Inhibitors, Acid Reflux, Active Sinus Disease, Alpha 1 Antitripsin deficiency, Anxiety masquerading as Airways dz,  ABPA,  Allergy(esp in young), Aspiration (esp in elderly), Adverse effects of meds,  Active smoking or Vaping, A bunch of PE's/clot burden (a few small clots can't cause this syndrome unless there is already severe underlying pulm or vascular dz with poor reserve),  Anemia or thyroid disorder,  plus two Bs  = Bronchiectasis and Beta blocker use..and one C= CHF     Adherence is always the initial "prime suspect" and is a multilayered concern that requires a "trust but verify" approach in every patient - starting with knowing how to use medications, especially inhalers, correctly, keeping up with refills and understanding the fundamental difference between maintenance and prns vs those medications only taken for a very short course and then stopped and not refilled.   ? Acid (or non-acid) GERD > always difficult to exclude as up to 75% of pts in some series report no assoc GI/ Heartburn symptoms> rec max (24h)  acid suppression and diet restrictions/ reviewed and instructions given in writing - try for one month then return to PCP for f/u as no indication of a pulmonary problem here.   Active smoking > see sep a/p  ? Anxiety/depression/ deconditioning  > usually at the bottom of this list of usual suspects but should be much higher on this pt's based on H and P and note already on psychotropics and may interfere with adherence and also interpretation of response or lack  thereof to symptom management which can be quite subjective.   ? Anemia/ thyroid dz > excluded today  ? A bunch of PEs > D dimer nl - while a normal  or high normal value (seen commonly in the elderly or chronically ill)  may miss small peripheral pe, the clot burden with sob is moderately high and the d dimer  has a very high neg pred value if used in this setting.    ? chf > nl bnp excludes this dx for all intents and purposes     Cigarette smoker 4-5 min discussion re active cigarette smoking in addition to office E&M  Ask about tobacco use:   ongoing Advise quitting   > 3 min discussion I reviewed the Fletcher curve with the patient that basically indicates  if you quit smoking when your best day FEV1 is still well preserved (as is clearly  the case here)  it is highly unlikely you will progress to severe disease  and informed the patient there was  no medication on the market that has proven to alter the curve/ its downward trajectory  or the likelihood of progression of their disease(unlike other chronic medical conditions such as atheroclerosis where we do think we can change the natural hx with risk reducing meds)    Therefore stopping smoking and maintaining abstinence are  the most important aspects of care, not choice of inhalers or for that matter, doctors.    >>> Treatment other than smoking cessation  is entirely directed by severity of symptoms and focused also on reducing exacerbations, not attempting to change the natural history of the disease.   Since she is not limited by airflow obst or having aecopd >>> pulmonary f/u can be prn  Assess willingness:  Not committed at this point Assist in quit attempt:  Per PCP when ready Arrange follow up:   Follow up per Primary Care planned  For smoking cessation classes call [phone]    >>>> Pulmonary f/u is prn   Total time devoted to counseling  > 50 % of initial 60 min office visit:  review case with pt/ discussion of options/alternatives/ personally creating written customized instructions  in presence of pt  then going over those specific  Instructions directly with the pt including how to use all of the meds but in particular covering each new medication in detail and the difference between the maintenance= "automatic" meds and the prns using an action plan format for the latter (If this problem/symptom => do that organization reading Left to right).  Please see AVS from this visit for a full list of these instructions which I personally wrote for this pt and  are unique to this visit.        Christinia Gully, MD 10/29/2018

## 2018-10-29 NOTE — Patient Instructions (Signed)
Pantoprazole (protonix) 40 mg   Take  30-60 min before first meal of the day and Pepcid (famotidine)  20 mg one after supper   X  One month trial   GERD (REFLUX)  is an extremely common cause of respiratory symptoms just like yours , many times with no obvious heartburn at all.    It can be treated with medication, but also with lifestyle changes including elevation of the head of your bed (ideally with 6 -8inch blocks under the headboard of your bed),  Smoking cessation, avoidance of late meals, excessive alcohol, and avoid fatty foods, chocolate, peppermint, colas, red wine, and acidic juices such as orange juice.  NO MINT OR MENTHOL PRODUCTS SO NO COUGH DROPS  USE SUGARLESS CANDY INSTEAD (Jolley ranchers or Stover's or Life Savers) or even ice chips will also do - the key is to swallow to prevent all throat clearing. NO OIL BASED VITAMINS - use powdered substitutes.  Avoid fish oil when coughing.     The key is to stop smoking completely before smoking completely stops you! - For smoking cessation classes/info call [phone]   Please remember to go to the lab and x-ray department   for your tests - we will call you with the results when they are available.

## 2018-10-29 NOTE — Assessment & Plan Note (Addendum)
Onset 2016  - PFT's  10/29/2018  FEV1 2.08 (93 % ) ratio 0.84  p 0 % improvement from saba p ? prior to study with DLCO  78 % corrects to 93 % for alv volume and 20% improvement in FVC p saba with incomplete exhalation  Despite smoking hx she does not meet criteria for copd dx and Symptoms are markedly disproportionate to objective findings and not clear to what extent this is actually a pulmonary  problem but pt does appear to have difficult to sort out respiratory symptoms of unknown origin for which  DDX  = almost all start with A and  include Adherence, Ace Inhibitors, Acid Reflux, Active Sinus Disease, Alpha 1 Antitripsin deficiency, Anxiety masquerading as Airways dz,  ABPA,  Allergy(esp in young), Aspiration (esp in elderly), Adverse effects of meds,  Active smoking or Vaping, A bunch of PE's/clot burden (a few small clots can't cause this syndrome unless there is already severe underlying pulm or vascular dz with poor reserve),  Anemia or thyroid disorder, plus two Bs  = Bronchiectasis and Beta blocker use..and one C= CHF     Adherence is always the initial "prime suspect" and is a multilayered concern that requires a "trust but verify" approach in every patient - starting with knowing how to use medications, especially inhalers, correctly, keeping up with refills and understanding the fundamental difference between maintenance and prns vs those medications only taken for a very short course and then stopped and not refilled.   ? Acid (or non-acid) GERD > always difficult to exclude as up to 75% of pts in some series report no assoc GI/ Heartburn symptoms> rec max (24h)  acid suppression and diet restrictions/ reviewed and instructions given in writing - try for one month then return to PCP for f/u as no indication of a pulmonary problem here.   Active smoking > see sep a/p  ? Anxiety/depression/ deconditioning  > usually at the bottom of this list of usual suspects but should be much higher on  this pt's based on H and P and note already on psychotropics and may interfere with adherence and also interpretation of response or lack thereof to symptom management which can be quite subjective.   ? Anemia/ thyroid dz > excluded today  ? A bunch of PEs > D dimer nl - while a normal  or high normal value (seen commonly in the elderly or chronically ill)  may miss small peripheral pe, the clot burden with sob is moderately high and the d dimer  has a very high neg pred value if used in this setting.    ? chf > nl bnp excludes this dx for all intents and purposes  Total time devoted to counseling  > 50 % of initial 60 min office visit:  review case with pt/ discussion of options/alternatives/ personally creating written customized instructions  in presence of pt  then going over those specific  Instructions directly with the pt including how to use all of the meds but in particular covering each new medication in detail and the difference between the maintenance= "automatic" meds and the prns using an action plan format for the latter (If this problem/symptom => do that organization reading Left to right).  Please see AVS from this visit for a full list of these instructions which I personally wrote for this pt and  are unique to this visit.

## 2018-10-30 ENCOUNTER — Institutional Professional Consult (permissible substitution): Payer: Medicaid Other | Admitting: Internal Medicine

## 2018-11-03 ENCOUNTER — Telehealth: Payer: Self-pay | Admitting: Internal Medicine

## 2018-11-03 MED ORDER — PANTOPRAZOLE SODIUM 40 MG PO TBEC: 40.0000 mg | DELAYED_RELEASE_TABLET | Freq: Every day | ORAL | 0 refills | Status: DC

## 2018-11-03 NOTE — Telephone Encounter (Signed)
Attempted to reach pt but unable to reach and unable to leave VM. Will try to call back later.

## 2018-11-03 NOTE — Telephone Encounter (Signed)
Pt is returning call.  [phone].

## 2018-11-03 NOTE — Telephone Encounter (Signed)
Instructions Pantoprazole (protonix) 40 mg   Take  30-60 min before first meal of the day and Pepcid (famotidine)  20 mg one after supper   X  One month trial   GERD (REFLUX)  is an extremely common cause of respiratory symptoms just like yours , many times with no obvious heartburn at all.    It can be treated with medication, but also with lifestyle changes including elevation of the head of your bed (ideally with 6 -8inch blocks under the headboard of your bed),  Smoking cessation, avoidance of late meals, excessive alcohol, and avoid fatty foods, chocolate, peppermint, colas, red wine, and acidic juices such as orange juice.  NO MINT OR MENTHOL PRODUCTS SO NO COUGH DROPS  USE SUGARLESS CANDY INSTEAD (Jolley ranchers or Stover's or Life Savers) or even ice chips will also do - the key is to swallow to prevent all throat clearing. NO OIL BASED VITAMINS - use powdered substitutes.  Avoid fish oil when coughing.     Called and spoke with pt to make sure she had received the results of both the labwork and cxr and pt stated she had. Pt stated she thought a med was going to be called into pharmacy for her and while looking at this, it seems like pt was needing Rx for Pantoprazole. I stated to pt that I would send Rx to pharmacy for her and she verbalized understanding. Verified pt's preferred pharmacy and sent Rx in. Nothing further needed.

## 2018-11-04 ENCOUNTER — Other Ambulatory Visit: Payer: Self-pay

## 2018-11-04 ENCOUNTER — Ambulatory Visit (INDEPENDENT_AMBULATORY_CARE_PROVIDER_SITE_OTHER): Payer: Medicaid Other | Admitting: Internal Medicine

## 2018-11-04 VITALS — BP 172/98 | HR 58 | Temp 98.3°F | Ht 64.0 in | Wt 147.0 lb

## 2018-11-04 DIAGNOSIS — Z96642 Presence of left artificial hip joint: Secondary | ICD-10-CM

## 2018-11-04 DIAGNOSIS — R011 Cardiac murmur, unspecified: Secondary | ICD-10-CM

## 2018-11-04 DIAGNOSIS — R0609 Other forms of dyspnea: Secondary | ICD-10-CM

## 2018-11-04 DIAGNOSIS — Z79899 Other long term (current) drug therapy: Secondary | ICD-10-CM

## 2018-11-04 DIAGNOSIS — I10 Essential (primary) hypertension: Secondary | ICD-10-CM

## 2018-11-04 DIAGNOSIS — R0600 Dyspnea, unspecified: Secondary | ICD-10-CM | POA: Diagnosis not present

## 2018-11-04 MED ORDER — AMLODIPINE BESYLATE 10 MG PO TABS: 10.0000 mg | ORAL_TABLET | Freq: Every day | ORAL | 3 refills | Status: DC

## 2018-11-04 NOTE — Progress Notes (Signed)
   CC: HTN   HPI:  [name] is a 53 y.o. female who presents for follow-up on HTN. Please see problem based assessment and plan for further details.    Past Medical History:  Diagnosis Date  . Adnexal mass 2019   likely remnant fibroid on broad ligament; getting diagnostic lap  . Anxiety   . Arthritis    hip  . Asthma   . Carpal tunnel syndrome    bilateral  . Chronic lower back pain   . Depression   . Dyspnea    with exertion  . GERD (gastroesophageal reflux disease)   . Headache    migraines  . Herpes   . Hypertension   . Liver lesion, right lobe 12/01/2015   CT chest without contrast 11/30/15 with hepatic lesions measuring 2.0 x 1.7 cm of the right lobe and 1 x 1 cm on the dome. CT abd/pel 8/18: 1.3cm inferior R liver lobe, prob benign but recommend 3-55mo f/u with MRI w/wo IV contrast with attenuation MRI 6/19: combination of benign cavernous hemangiomas and small benign cysts  . Poor dental hygiene    chipped upper front and several loose teeth  . Substance abuse ([place])    cocaine/crack cocaine - last use 02/07/18  . SVD (spontaneous vaginal delivery)    x 1  . Uses walker    for ambulation  . Uterine leiomyoma 02/16/2016   s/p vaginal hysterectomy 2018   Review of Systems:  Review of Systems  Constitutional: Negative for chills and fever.  Eyes: Negative for blurred vision and double vision.  Cardiovascular: Negative for chest pain and palpitations.  Gastrointestinal: Negative for abdominal pain, nausea and vomiting.  Musculoskeletal: Positive for joint pain.  Skin: Negative for rash.  Neurological: Negative for dizziness and headaches.    Physical Exam:  Vitals:   11/04/18 1034  BP: (!) 172/98  Pulse: (!) 58  Temp: 98.3 F (36.8 C)  TempSrc: Oral  SpO2: 99%  Weight: 147 lb (66.7 kg)  Height: 5\' 4"  (1.626 m)   Physical Exam Constitutional:      General: She is not in acute distress.    Appearance: Normal appearance.  Eyes:   Conjunctiva/sclera: Conjunctivae normal.  Neck:     Musculoskeletal: Neck supple.  Cardiovascular:     Rate and Rhythm: Normal rate and regular rhythm.     Heart sounds: Murmur present.  Pulmonary:     Effort: Pulmonary effort is normal.     Breath sounds: Normal breath sounds.  Abdominal:     Palpations: Abdomen is soft.     Tenderness: There is no abdominal tenderness.  Musculoskeletal:     Right lower leg: No edema.     Left lower leg: No edema.  Neurological:     Mental Status: She is alert.     Assessment & Plan:   See Encounters Tab for problem based charting.  Patient discussed with Dr. Rebeca Alert

## 2018-11-04 NOTE — Patient Instructions (Signed)
Jean [name], It was nice meeting you. I have increased your blood pressure medication (Amlodipine) to 10 mg daily. I have placed referral for home health as requested.  I'd also like to get a repeat ultrasound of your heart since you have not had one in a few years.   Please follow-up with your new PCP in 3-4 weeks.   Take care, Dr. Koleen Distance

## 2018-11-05 ENCOUNTER — Telehealth: Payer: Self-pay

## 2018-11-05 NOTE — Telephone Encounter (Signed)
RE: homehealth order Received: Today Message Contents  Shon Baton, Tiffany Carlean Jews  Jennah Satchell, [place], [place]        Hi Sebron Mcmahill,  looks like we have her open under services, the family asked [place] to put her on hold? Not sure of all the details but I will find out from the clinical manager, but we are happy to see her again   Previous Messages  ----- Message -----  From: Judge Stall, NT  Sent: 11/05/2018  9:49 AM EDT  To: Mardene Celeste  Subject: homehealth order                 New order has been placed for patient she has used your service before,would you let me no if you can assist her,and when or what day you start [place], [place] C7/1/20209:52 AM

## 2018-11-06 ENCOUNTER — Encounter: Payer: Self-pay | Admitting: Internal Medicine

## 2018-11-06 NOTE — Progress Notes (Signed)
[facility] Attending  Case discussed with Dr. Bloomfield at the time of the visit.  We reviewed the resident's history and exam and pertinent patient test results.  I agree with the assessment, diagnosis, and plan of care documented in the resident's note.  Alexander Raines, M.D., Ph.D.  

## 2018-11-06 NOTE — Assessment & Plan Note (Signed)
Poorly controlled. Patient is asymptomatic. Will increase Amlodipine to 10 mg. Continue HCTZ 25 mg. Follow-up in 3-4 weeks with new PCP.

## 2018-11-06 NOTE — Assessment & Plan Note (Signed)
Recently seen by pulmonology. PFTs were unremarkable. Did not appear to be a pulmonary etiology to her symptoms.  She was started on a PPI for possible GERD.  Given her murmur on exam and persistent symptoms, think it is reasonable to repeat echo (last one done in 2017).

## 2018-11-11 ENCOUNTER — Other Ambulatory Visit: Payer: Self-pay

## 2018-11-11 ENCOUNTER — Ambulatory Visit (HOSPITAL_COMMUNITY)
Admission: RE | Admit: 2018-11-11 | Discharge: 2018-11-11 | Disposition: A | Discharge status: Home / Self Care | Payer: Medicaid Other | Source: Ambulatory Visit | Attending: Internal Medicine | Admitting: Internal Medicine

## 2018-11-11 DIAGNOSIS — F17200 Nicotine dependence, unspecified, uncomplicated: Secondary | ICD-10-CM | POA: Diagnosis not present

## 2018-11-11 DIAGNOSIS — J45909 Unspecified asthma, uncomplicated: Secondary | ICD-10-CM | POA: Insufficient documentation

## 2018-11-11 DIAGNOSIS — I358 Other nonrheumatic aortic valve disorders: Secondary | ICD-10-CM | POA: Insufficient documentation

## 2018-11-11 DIAGNOSIS — R0609 Other forms of dyspnea: Secondary | ICD-10-CM | POA: Diagnosis present

## 2018-11-11 DIAGNOSIS — I129 Hypertensive chronic kidney disease with stage 1 through stage 4 chronic kidney disease, or unspecified chronic kidney disease: Secondary | ICD-10-CM | POA: Diagnosis not present

## 2018-11-11 DIAGNOSIS — N189 Chronic kidney disease, unspecified: Secondary | ICD-10-CM | POA: Diagnosis not present

## 2018-11-11 MED ORDER — FAMOTIDINE 20 MG PO TABS: 20.0000 mg | ORAL_TABLET | Freq: Every day | ORAL | 0 refills | Status: DC | PRN

## 2018-11-11 MED ORDER — PANTOPRAZOLE SODIUM 40 MG PO TBEC: 40.0000 mg | DELAYED_RELEASE_TABLET | Freq: Every day | ORAL | 0 refills | Status: DC

## 2018-11-11 NOTE — Telephone Encounter (Signed)
famotidine (PEPCID) 20 MG tablet,  pantoprazole (PROTONIX) 40 MG tablet, is not working. Requesting to speak with a nurse.

## 2018-11-12 ENCOUNTER — Other Ambulatory Visit: Payer: Self-pay | Admitting: *Deleted

## 2018-11-12 DIAGNOSIS — F4321 Adjustment disorder with depressed mood: Secondary | ICD-10-CM

## 2018-11-12 DIAGNOSIS — I10 Essential (primary) hypertension: Secondary | ICD-10-CM

## 2018-11-13 ENCOUNTER — Encounter: Payer: Self-pay | Admitting: Physical Medicine and Rehabilitation

## 2018-11-13 ENCOUNTER — Other Ambulatory Visit: Payer: Self-pay

## 2018-11-13 ENCOUNTER — Telehealth: Payer: Self-pay | Admitting: Internal Medicine

## 2018-11-13 ENCOUNTER — Ambulatory Visit (INDEPENDENT_AMBULATORY_CARE_PROVIDER_SITE_OTHER): Payer: Medicaid Other | Admitting: Physical Medicine and Rehabilitation

## 2018-11-13 DIAGNOSIS — R202 Paresthesia of skin: Secondary | ICD-10-CM | POA: Diagnosis not present

## 2018-11-13 NOTE — Procedures (Signed)
EMG & NCV Findings: All nerve conduction studies (as indicated in the following tables) were within normal limits.    All examined muscles (as indicated in the following table) showed no evidence of electrical instability.    Impression: Essentially NORMAL electrodiagnostic study of the right upper limb.  There is no significant electrodiagnostic evidence of nerve entrapment, brachial plexopathy or cervical radiculopathy.    As you know, purely sensory or demyelinating radiculopathies and chemical radiculitis may not be detected with this particular electrodiagnostic study.  **This electrodiagnostic study cannot rule out small fiber polyneuropathy and dysesthesias from central pain syndromes such as stroke or central pain sensitization syndromes such as fibromyalgia.  Myotomal referral pain from trigger points is also not excluded.  Recommendations: 1.  Follow-up with referring physician. 2.  Continue current management of symptoms. 3.  Suggest repeat C6-7 cervical epidural at [place], consider spine/neurosurgery eval ___________________________ Laurence Spates A[place]a Board Certified, American Board of Physical Medicine and Rehabilitation    Nerve Conduction Studies Anti Sensory Summary Table   Stim Site NR Peak (ms) Norm Peak (ms) P-T Amp (V) Norm P-T Amp Site1 Site2 Delta-P (ms) Dist (cm) Vel (m/s) Norm Vel (m/s)  Right Median Acr Palm Anti Sensory (2nd Digit)  32C  Wrist    3.5 <3.6 50.9 >10 Wrist Palm 2.4 0.0    Palm    1.1 <2.0 8.7         Right Radial Anti Sensory (Base 1st Digit)  32.2C  Wrist    2.1 <3.1 34.4  Wrist Base 1st Digit 2.1 0.0    Right Ulnar Anti Sensory (5th Digit)  32.2C  Wrist    3.2 <3.7 22.1 >15.0 Wrist 5th Digit 3.2 14.0 44 >38   Motor Summary Table   Stim Site NR Onset (ms) Norm Onset (ms) O-P Amp (mV) Norm O-P Amp Site1 Site2 Delta-0 (ms) Dist (cm) Vel (m/s) Norm Vel (m/s)  Right Median Motor (Abd Poll Brev)  32.1C  Wrist    3.4 <4.2 9.5  >5 Elbow Wrist 4.1 20.5 50 >50  Elbow    7.5  8.4         Right Ulnar Motor (Abd Dig Min)  32.1C  Wrist    2.5 <4.2 8.9 >3 B Elbow Wrist 3.2 20.5 64 >53  B Elbow    5.7  8.2  A Elbow B Elbow 1.4 9.0 64 >53  A Elbow    7.1  7.5          EMG   Side Muscle Nerve Root Ins Act Fibs Psw Amp Dur Poly Recrt Int Fraser Din Comment  Right 1stDorInt Ulnar C8-T1 Nml Nml Nml Nml Nml 0 Nml Nml   Right Abd Poll Brev Median C8-T1 Nml Nml Nml Nml Nml 0 Nml Nml   Right ExtDigCom   Nml Nml Nml Nml Nml 0 Nml Nml   Right Triceps Radial C6-7-8 Nml Nml Nml Nml Nml 0 Nml Nml   Right Deltoid Axillary C5-6 Nml Nml Nml Nml Nml 0 Nml Nml     Nerve Conduction Studies Anti Sensory Left/Right Comparison   Stim Site L Lat (ms) R Lat (ms) L-R Lat (ms) L Amp (V) R Amp (V) L-R Amp (%) Site1 Site2 L Vel (m/s) R Vel (m/s) L-R Vel (m/s)  Median Acr Palm Anti Sensory (2nd Digit)  32C  Wrist  3.5   50.9  Wrist Palm     Palm  1.1   8.7        Radial  Anti Sensory (Base 1st Digit)  32.2C  Wrist  2.1   34.4  Wrist Base 1st Digit     Ulnar Anti Sensory (5th Digit)  32.2C  Wrist  3.2   22.1  Wrist 5th Digit  44    Motor Left/Right Comparison   Stim Site L Lat (ms) R Lat (ms) L-R Lat (ms) L Amp (mV) R Amp (mV) L-R Amp (%) Site1 Site2 L Vel (m/s) R Vel (m/s) L-R Vel (m/s)  Median Motor (Abd Poll Brev)  32.1C  Wrist  3.4   9.5  Elbow Wrist  50   Elbow  7.5   8.4        Ulnar Motor (Abd Dig Min)  32.1C  Wrist  2.5   8.9  B Elbow Wrist  64   B Elbow  5.7   8.2  A Elbow B Elbow  64   A Elbow  7.1   7.5           Waveforms:

## 2018-11-13 NOTE — Progress Notes (Signed)
Jean [name] - 53 y.o. female MRN [id number]  Date of birth: [birth year]  Office Visit Note: Visit Date: 11/13/2018 PCP: Jose Persia, MD Referred by: Jose Persia, MD  Subjective: Chief Complaint  Patient presents with  . Right Hand - Pain, Numbness   HPI: Jean [name] is a 53 y.o. female who comes in today At the request of Jean [name], P.A.-C for electrodiagnostic study.  Patient is someone that I saw in 2019 and completed 3 C7-T1 interlaminar epidural steroid injections over the course of several months throughout the year.  She reported more than 50% relief.  These were more left-sided radicular type pain and paresthesia.  Patient has had MRI of the cervical spine reviewed below.  She went on to have another epidural injection performed in March 2020 at [facility] and this was a C6-7 injection that she said did help quite a bit.  Today she is complaining of right-sided symptoms with the arm feeling heavy with paresthesias mainly into the index finger.  She does endorse pain from the neck down through the arm.  She is ambulating today with a walker.  Her case is very complicated that she has a history of major depressive disorder as well as substance abuse which was cocaine and she is followed by neurology for her headaches.  They had actually ordered electrodiagnostic study as well but that has not been performed.  Patient has been told in the past that she had carpal tunnel syndrome.  She is right-hand dominant.  She rates her pain as 10 out of 10 and just really cannot do anything with the right arm.  She says she is unable to hold objects or turning lids or opening things.  Pain is constant morning and night.  Again this is right-sided symptoms not left-sided symptoms.  ROS Otherwise per HPI.  Assessment & Plan: Visit Diagnoses:  1. Paresthesia of skin     Plan: Impression: Essentially NORMAL electrodiagnostic study of the right upper limb.  There is no significant  electrodiagnostic evidence of nerve entrapment, brachial plexopathy or cervical radiculopathy.    As you know, purely sensory or demyelinating radiculopathies and chemical radiculitis may not be detected with this particular electrodiagnostic study.  **This electrodiagnostic study cannot rule out small fiber polyneuropathy and dysesthesias from central pain syndromes such as stroke or central pain sensitization syndromes such as fibromyalgia.  Myotomal referral pain from trigger points is also not excluded.  Recommendations: 1.  Follow-up with referring physician. 2.  Continue current management of symptoms. 3.  Suggest repeat C6-7 cervical epidural at [place], consider spine/neurosurgery eval  Meds & Orders: No orders of the defined types were placed in this encounter.   Orders Placed This Encounter  Procedures  . NCV with EMG (electromyography)    Follow-up: Return for Jean Stabile, PA-C.   Procedures: No procedures performed  EMG & NCV Findings: All nerve conduction studies (as indicated in the following tables) were within normal limits.    All examined muscles (as indicated in the following table) showed no evidence of electrical instability.    Impression: Essentially NORMAL electrodiagnostic study of the right upper limb.  There is no significant electrodiagnostic evidence of nerve entrapment, brachial plexopathy or cervical radiculopathy.    As you know, purely sensory or demyelinating radiculopathies and chemical radiculitis may not be detected with this particular electrodiagnostic study.  **This electrodiagnostic study cannot rule out small fiber polyneuropathy and dysesthesias from central pain syndromes such as stroke or  central pain sensitization syndromes such as fibromyalgia.  Myotomal referral pain from trigger points is also not excluded.  Recommendations: 1.  Follow-up with referring physician. 2.  Continue current management of symptoms. 3.  Suggest  repeat C6-7 cervical epidural at [place], consider spine/neurosurgery eval ___________________________ [facility] Board Certified, American Board of Physical Medicine and Rehabilitation    Nerve Conduction Studies Anti Sensory Summary Table   Stim Site NR Peak (ms) Norm Peak (ms) P-T Amp (V) Norm P-T Amp Site1 Site2 Delta-P (ms) Dist (cm) Vel (m/s) Norm Vel (m/s)  Right Median Acr Palm Anti Sensory (2nd Digit)  32C  Wrist    3.5 <3.6 50.9 >10 Wrist Palm 2.4 0.0    Palm    1.1 <2.0 8.7         Right Radial Anti Sensory (Base 1st Digit)  32.2C  Wrist    2.1 <3.1 34.4  Wrist Base 1st Digit 2.1 0.0    Right Ulnar Anti Sensory (5th Digit)  32.2C  Wrist    3.2 <3.7 22.1 >15.0 Wrist 5th Digit 3.2 14.0 44 >38   Motor Summary Table   Stim Site NR Onset (ms) Norm Onset (ms) O-P Amp (mV) Norm O-P Amp Site1 Site2 Delta-0 (ms) Dist (cm) Vel (m/s) Norm Vel (m/s)  Right Median Motor (Abd Poll Brev)  32.1C  Wrist    3.4 <4.2 9.5 >5 Elbow Wrist 4.1 20.5 50 >50  Elbow    7.5  8.4         Right Ulnar Motor (Abd Dig Min)  32.1C  Wrist    2.5 <4.2 8.9 >3 B Elbow Wrist 3.2 20.5 64 >53  B Elbow    5.7  8.2  A Elbow B Elbow 1.4 9.0 64 >53  A Elbow    7.1  7.5          EMG   Side Muscle Nerve Root Ins Act Fibs Psw Amp Dur Poly Recrt Int Fraser Din Comment  Right 1stDorInt Ulnar C8-T1 Nml Nml Nml Nml Nml 0 Nml Nml   Right Abd Poll Brev Median C8-T1 Nml Nml Nml Nml Nml 0 Nml Nml   Right ExtDigCom   Nml Nml Nml Nml Nml 0 Nml Nml   Right Triceps Radial C6-7-8 Nml Nml Nml Nml Nml 0 Nml Nml   Right Deltoid Axillary C5-6 Nml Nml Nml Nml Nml 0 Nml Nml     Nerve Conduction Studies Anti Sensory Left/Right Comparison   Stim Site L Lat (ms) R Lat (ms) L-R Lat (ms) L Amp (V) R Amp (V) L-R Amp (%) Site1 Site2 L Vel (m/s) R Vel (m/s) L-R Vel (m/s)  Median Acr Palm Anti Sensory (2nd Digit)  32C  Wrist  3.5   50.9  Wrist Palm     Palm  1.1   8.7        Radial Anti Sensory (Base 1st Digit)   32.2C  Wrist  2.1   34.4  Wrist Base 1st Digit     Ulnar Anti Sensory (5th Digit)  32.2C  Wrist  3.2   22.1  Wrist 5th Digit  44    Motor Left/Right Comparison   Stim Site L Lat (ms) R Lat (ms) L-R Lat (ms) L Amp (mV) R Amp (mV) L-R Amp (%) Site1 Site2 L Vel (m/s) R Vel (m/s) L-R Vel (m/s)  Median Motor (Abd Poll Brev)  32.1C  Wrist  3.4   9.5  Elbow Wrist  50   Elbow  7.5   8.4        Ulnar Motor (Abd Dig Min)  32.1C  Wrist  2.5   8.9  B Elbow Wrist  64   B Elbow  5.7   8.2  A Elbow B Elbow  64   A Elbow  7.1   7.5           Waveforms:            Clinical History: PROCEDURE: CERVICAL EPIDURAL INJECTION  An interlaminar approach was performed on the left at C6-7. A 20 gauge epidural needle was advanced using loss-of-resistance technique.  DIAGNOSTIC EPIDURAL INJECTION  Injection of Isovue-M 300 shows a good epidural pattern with spread above and below the level of needle placement, primarily on the left. No vascular opacification is seen. THERAPEUTIC  EPIDURAL INJECTION  1.5 ml of Kenalog 40 mixed with 1 ml of 1% Lidocaine and 2 ml of normal saline were then instilled. The procedure was well-tolerated, and the patient was discharged thirty minutes following the injection in good condition.  IMPRESSION: Technically successful first epidural injection on the left at C6-7.   Electronically Signed   By: San Morelle M.D.   On: 07/08/2018 10:18  ------------ MRI CERVICAL SPINE WITHOUT CONTRAST  TECHNIQUE: Multiplanar, multisequence MR imaging of the cervical spine was performed. No intravenous contrast was administered.  COMPARISON: Prior radiograph from 07/18/2016.  FINDINGS: Alignment: Trace anterolisthesis of C5 on C6 and C7 on T1. Mild straightening of the normal cervical lordosis.  Vertebrae: Vertebral body heights maintained without evidence for acute or chronic fracture. Bone marrow signal intensity within normal limits.  No discrete or worrisome osseous lesions. Reactive marrow edema about the right C5-6, C6-7, and C7-T1 facets (series 4, image 1) as well as the left C7-T1 facet (series 4, image 11) due to facet arthritis.  Cord: Signal intensity within the cervical spinal cord is normal.  Posterior Fossa, vertebral arteries, paraspinal tissues: Visualized brain and posterior fossa within normal limits. Craniocervical junction normal. Paraspinous and prevertebral soft tissues normal. Normal flow voids present within the vertebral arteries bilaterally.  Disc levels:  C2-C3: Unremarkable.  C3-C4: Diffuse disc bulge with mild bilateral uncovertebral hypertrophy. No significant spinal stenosis. Mild to moderate bilateral C4 foraminal narrowing.  C4-C5: Mild diffuse disc bulge. Left greater than right uncovertebral spurring. Moderate left greater than right facet arthrosis. No significant canal narrowing. Severe left with mild-to-moderate right C5 foraminal stenosis.  C5-C6: Diffuse disc bulge with mild intervertebral disc space narrowing. Bilateral uncovertebral hypertrophy. Left greater than right facet arthrosis. No significant spinal stenosis. Mild to moderate left with mild right C6 foraminal narrowing.  C6-C7: Mild disc bulge with bilateral uncovertebral hypertrophy. Moderate left greater than right facet arthrosis. Ligament flavum thickening. No significant canal stenosis. Severe left with mild to moderate right C7 foraminal stenosis.  C7-T1: Trace anterolisthesis. Minimal disc bulge. Advanced bilateral facet arthrosis with ligamentum flavum thickening, left worse than right. No significant spinal stenosis. Moderate to severe left with moderate right C8 foraminal stenosis.  Visualized upper thoracic spine within normal limits.  IMPRESSION: 1. 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 2. Multifactorial degenerative changes with resultant multilevel foraminal narrowing as above. Given patient's left-sided symptoms, notable findings include severe left C5 foraminal stenosis with severe left C7 and C8 foraminal narrowing. Additional more mild foraminal encroachment as above. 3. Mild noncompressive disc bulging at C3-4 through C7-T1 without significant  spinal stenosis.   Electronically Signed By: Jeannine Boga M.D. On: 04/20/2017 22:06   She reports that she has been smoking cigarettes. She has a 8.70 pack-year smoking history. She has never used smokeless tobacco. No results for input(s): HGBA1C, LABURIC in the last 8760 hours.  Objective:  VS:  HT:    WT:   BMI:     BP:   HR: bpm  TEMP: ( )  RESP:  Physical Exam Musculoskeletal:        General: No swelling, tenderness or deformity.     Comments: Inspection reveals no atrophy of the bilateral APB or FDI or hand intrinsics. There is no swelling, color changes, allodynia or dystrophic changes. There is 5 out of 5 strength in the bilateral wrist extension, finger abduction and long finger flexion.  There is somewhat vague impaired sensation more of a C6 dermatome on the right.   There is a negative Tinel's test at the bilateral wrist and elbow. There is a negative Phalen's test bilaterally. There is a negative Hoffmann's test bilaterally.  Interestingly patient allows the hand to continue to slip and fall down off of the chair armrest.  She uses her left hand to pick the arm up.  She can however move the arm with distraction.  Skin:    General: Skin is warm and dry.     Findings: No erythema or rash.  Neurological:     General: No focal deficit present.     Mental Status: She is alert and oriented to person, place, and time.     Motor: No weakness or abnormal muscle tone.     Coordination: Coordination normal.  Psychiatric:        Mood and Affect: Mood normal.        Behavior: Behavior normal.      Ortho Exam Imaging: No results found.  Past Medical/Family/Surgical/Social History: Medications & Allergies reviewed per EMR, new medications updated. Patient Active Problem List   Diagnosis Date Noted  . Cigarette smoker 10/29/2018  . Breast pain 06/23/2018  . Post-operative state 03/26/2018  . Vaginal discharge 03/26/2018  . Dyspareunia in female 09/13/2017  . Cervicalgia 04/24/2017  . CKD (chronic kidney disease) 10/29/2016  . Chest wall pain 10/15/2016  . Chronic pain syndrome 05/10/2016  . MDD (major depressive disorder) 10/19/2015  . Dyspnea on exertion 08/22/2015  . HTN (hypertension) 07/08/2015  . Routine health maintenance 07/08/2015  . Tobacco abuse 07/08/2015   Past Medical History:  Diagnosis Date  . Adnexal mass 2019   likely remnant fibroid on broad ligament; getting diagnostic lap  . Anxiety   . Arthritis    hip  . Asthma   . Carpal tunnel syndrome    bilateral  . Chronic lower back pain   . Depression   . Dyspnea    with exertion  . GERD (gastroesophageal reflux disease)   . Headache    migraines  . Herpes   . Hypertension   . Liver lesion, right lobe 12/01/2015   CT chest without contrast 11/30/15 with hepatic lesions measuring 2.0 x 1.7 cm of the right lobe and 1 x 1 cm on the dome. CT abd/pel 8/18: 1.3cm inferior R liver lobe, prob benign but recommend 3-14mo f/u with MRI w/wo IV contrast with attenuation MRI 6/19: combination of benign cavernous hemangiomas and small benign cysts  . Poor dental hygiene    chipped upper front and several loose teeth  . Substance abuse ([place])    cocaine/crack cocaine -  last use 02/07/18  . SVD (spontaneous vaginal delivery)    x 1  . Uses walker    for ambulation  . Uterine leiomyoma 02/16/2016   s/p vaginal hysterectomy 2018   Family History  Problem Relation Age of Onset  . Cancer Mother   . Hypertension Mother   . Diabetes Mother   . Breast cancer Mother        diagnosed in her 70's  . Stroke Father     Past Surgical History:  Procedure Laterality Date  . LAPAROSCOPIC OVARIAN CYSTECTOMY Right 02/25/2018   Procedure: LAPAROSCOPIC REMOVAL OF RIGHT ADNEXAL MASS;  Surgeon: Chancy Milroy, MD;  Location: [facility];  Service: Gynecology;  Laterality: Right;  . SALPINGECTOMY Left    lap - ectopic preg  . TOTAL HIP ARTHROPLASTY Left 01/20/2016   Procedure: LEFT TOTAL HIP ARTHROPLASTY ANTERIOR APPROACH;  Surgeon: Mcarthur Rossetti, MD;  Location: [facility];  Service: Orthopedics;  Laterality: Left;  . TUBAL LIGATION    . VAGINAL HYSTERECTOMY Right 12/11/2016   Procedure: HYSTERECTOMY VAGINAL WITH RIGHT SALPINGECTOMY;  Surgeon: Chancy Milroy, MD;  Location: [facility];  Service: Gynecology;  Laterality: Right;   Social History   Occupational History  . Not on file  Tobacco Use  . Smoking status: Current Every Day Smoker    Packs/day: 0.30    Years: 29.00    Pack years: 8.70    Types: Cigarettes  . Smokeless tobacco: Never Used  Substance and Sexual Activity  . Alcohol use: Yes    Alcohol/week: 3.0 standard drinks    Types: 3 Cans of beer per week    Comment: Occasional Beer   . Drug use: No    Types: "Crack" cocaine, Cocaine    Comment: Last use 2 wks ago 02/07/18  . Sexual activity: Yes    Partners: Male    Birth control/protection: None, Post-menopausal    Comment: Hysterectomy

## 2018-11-13 NOTE — Progress Notes (Signed)
.  Numeric Pain Rating Scale and Functional Assessment Average Pain 10   In the last MONTH (on 0-10 scale) has pain interfered with the following?  1. General activity like being  able to carry out your everyday physical activities such as walking, climbing stairs, carrying groceries, or moving a chair?  Rating(10)     

## 2018-11-13 NOTE — Telephone Encounter (Signed)
Pt states she is experiencing more GERD since last OV and states the Pepcid and Protonix arent working. Pt c/o increase in SOB and burning sensation in chest, pt sounded audibly dyspneic on phone. Pt states she still has a mild non prod cough. Pt states her albuterol hfa isn't working when she is SOB. Pt has a pulse/ox, her HR is 85 and SpO2 is 100%, reconfirmed with pt to assure she is reading the pulse/ox correctly. Pt states in the past ranitidine worked. Pt is requesting recs today.   Primary Pulmonologist: Wert Last office visit and with whom: 10/29/2018 What do we see them for (pulmonary problems): DOE  Reason for call: Increase in SOB  In the last month, have you been in contact with someone who was confirmed or suspected to have Conoravirus / COVID-19?  no  Do you have any of the following symptoms developed in the last 30 days? Fever: no Cough: mild no prod cough Shortness of breath: yes  When did your symptoms start?  Prior to last OV  If the patient has a fever, what is the last reading?  (use n/a if patient denies fever)  n/a . IF THE PATIENT STATES THEY DO NOT OWN A THERMOMETER, THEY MUST GO AND PURCHASE ONE When did the fever start?: n/a Have you taken any medication to suppress a fever (ie Ibuprofen, Aleve, Tylenol)?: no   Dr. Melvyn Novas please advise. Thanks.

## 2018-11-13 NOTE — Telephone Encounter (Signed)
Called and spoke to pt. Informed her of the recs per MW. Pt verbalized understanding. Nothing further needed at this time.

## 2018-11-13 NOTE — Telephone Encounter (Signed)
I could not identify a lung problem during the prior eval for [facility] but did feel gerd may be contributing to her symptoms so rec max gerd rx and diet which included NO smoking  So have nothing else to offer thru this clinic  Should call pcp for referral to gi and go to er in meantime if condition worsens

## 2018-11-19 ENCOUNTER — Ambulatory Visit: Payer: Medicaid Other | Admitting: Orthopaedic Surgery

## 2018-11-19 ENCOUNTER — Ambulatory Visit: Payer: Medicaid Other

## 2018-11-20 NOTE — Telephone Encounter (Signed)
Left message for Joseph Pierini, RN with [facility] requesting [place] date for [facility] PT. Hubbard Hartshorn, RN, BSN

## 2018-11-20 NOTE — Telephone Encounter (Signed)
Tiffany returned call. States [facility] contacted medicaid for authorization for more visits since patient was first seen for [facility] PT on 09/17/2018. They are awaiting reply. They are scheduled to do another [facility] PT eval on 11/22/2018 as they know medicaid will cover that. Hubbard Hartshorn, RN, BSN

## 2018-11-24 ENCOUNTER — Encounter: Payer: Self-pay | Admitting: Gastroenterology

## 2018-11-24 ENCOUNTER — Encounter: Payer: Self-pay | Admitting: Internal Medicine

## 2018-11-24 ENCOUNTER — Other Ambulatory Visit: Payer: Self-pay

## 2018-11-24 ENCOUNTER — Encounter: Payer: Self-pay | Admitting: Orthopaedic Surgery

## 2018-11-24 ENCOUNTER — Ambulatory Visit (INDEPENDENT_AMBULATORY_CARE_PROVIDER_SITE_OTHER): Payer: Medicaid Other

## 2018-11-24 ENCOUNTER — Ambulatory Visit (INDEPENDENT_AMBULATORY_CARE_PROVIDER_SITE_OTHER): Payer: Medicaid Other | Admitting: Orthopaedic Surgery

## 2018-11-24 ENCOUNTER — Ambulatory Visit (INDEPENDENT_AMBULATORY_CARE_PROVIDER_SITE_OTHER): Payer: Medicaid Other | Admitting: Internal Medicine

## 2018-11-24 VITALS — BP 157/73 | HR 67 | Temp 98.6°F | Ht 64.0 in | Wt 148.6 lb

## 2018-11-24 DIAGNOSIS — Z720 Tobacco use: Secondary | ICD-10-CM | POA: Diagnosis not present

## 2018-11-24 DIAGNOSIS — I10 Essential (primary) hypertension: Secondary | ICD-10-CM | POA: Diagnosis not present

## 2018-11-24 DIAGNOSIS — M79601 Pain in right arm: Secondary | ICD-10-CM

## 2018-11-24 DIAGNOSIS — K219 Gastro-esophageal reflux disease without esophagitis: Secondary | ICD-10-CM | POA: Diagnosis present

## 2018-11-24 DIAGNOSIS — Z96649 Presence of unspecified artificial hip joint: Secondary | ICD-10-CM | POA: Diagnosis not present

## 2018-11-24 DIAGNOSIS — M25559 Pain in unspecified hip: Secondary | ICD-10-CM

## 2018-11-24 DIAGNOSIS — R1310 Dysphagia, unspecified: Secondary | ICD-10-CM

## 2018-11-24 DIAGNOSIS — Z79899 Other long term (current) drug therapy: Secondary | ICD-10-CM | POA: Diagnosis not present

## 2018-11-24 DIAGNOSIS — M792 Neuralgia and neuritis, unspecified: Secondary | ICD-10-CM | POA: Diagnosis not present

## 2018-11-24 DIAGNOSIS — K5900 Constipation, unspecified: Secondary | ICD-10-CM | POA: Diagnosis not present

## 2018-11-24 DIAGNOSIS — G8929 Other chronic pain: Secondary | ICD-10-CM

## 2018-11-24 DIAGNOSIS — R1319 Other dysphagia: Secondary | ICD-10-CM

## 2018-11-24 NOTE — Patient Instructions (Signed)
Thank you for allowing [place] to provide your care today. Today we discussed your gastroesophageal reflux.   I have placed a referral to GI for your difficulty swallowing.  Please continue your protonix 40 mg - this can sometimes take up to two months to take effect. Continue pepcid as needed for breakthrough GERD.    Please follow-up with your PCP.     Should you have any questions or concerns please call the internal medicine clinic at [phone].

## 2018-11-24 NOTE — Assessment & Plan Note (Signed)
She continues to have difficulty showering on her own since her hip replacement. She previously had PT/OT, who taught her to use shower transfer bench, but she states she cannot do this on her own and needs an aide. She was previously denied for [place]. Offered repeat order of PT/OT to see what other strategies might work at home, but she declined. I suggested she follow-up with orthopedics again to see if they have any available options for more help at home.

## 2018-11-24 NOTE — Assessment & Plan Note (Signed)
BP Readings from Last 3 Encounters:  11/24/18 (!) 157/73  11/04/18 (!) 172/98  10/29/18 124/90   She has been out of blood pressure medications for a week.   - will see Dr. Maudie Mercury as she has medassist program and can get medications this way.   ADDENDUM: she left before being able to see Dr. Maudie Mercury. We will contact her to see if she is able to come back and pick up her medications.

## 2018-11-24 NOTE — Progress Notes (Signed)
The patient comes in today to go over nerve conduction studies of her right upper extremity due to persistent numbness and tingling.  She is also complaining of right shoulder pain.  She has had a cervical MRI done in 2018 and he did show some moderate foraminal stenosis at the lower cervical spine.  At one time she had left-sided symptoms but now this is all the right side.  She is not a diabetic.  She has multiple joint complaints and does ambulate with a rolling walker.  On exam I can move her shoulder easily on the right side and is well located a lot of pain out of proportion of exam.  She is still subjective numbness in her right arm but no muscle atrophy that I can see.  The nerve conduction study showed no evidence of nerve entrapment in the right upper extremity.  It was essentially normal study.  Her old MRI from 2018 was reviewed again.  Certainly there is moderate to severe foraminal stenosis at the C8 level according to the radiologist.  I am not sure if there is anything else that I would recommend for her from an orthopedic surgery standpoint.  I would like her to have at least a one-time evaluation by Dr. Lorin Mercy so he can assess her MRI of her neck to see if this is what is causing her upper extremity symptoms or not.  If he does not think that is the case, she can just follow-up with [place] again or be seen as needed.

## 2018-11-24 NOTE — Progress Notes (Signed)
   CC: chest burning, difficulty swallowing  HPI:  [name] is a 53 y.o. with PMH as below presenting for refractory symptoms of chest and epigastric burning despite ppi therapy. She was previously on ranitidine, which she states helped. Once this was recalled she was switched to protonix 40 mg qd with pepcid. She continues to have symptoms after she eats, especially cold cuts, chips, hamburgers. The pain does not radiate anywhere. She has no jaw pain, left arm numbness or tingling. The pain does not worsen with exertion. She denies n/v. It is worse at night. She also has difficulty swallowing solids where it feels like they're stuck in her throat. This does not happen with liquids. She has constipation which she takes stool softeners for, these do help. She denies melena or BRB in her stool or other abdominal pain. She smokes 1 cigarette per day and occasionally has a beer.   Please see A&P for assessment of the patient's acute and chronic medical conditions.    Past Medical History:  Diagnosis Date  . Adnexal mass 2019   likely remnant fibroid on broad ligament; getting diagnostic lap  . Anxiety   . Arthritis    hip  . Asthma   . Carpal tunnel syndrome    bilateral  . Chronic lower back pain   . Depression   . Dyspnea    with exertion  . GERD (gastroesophageal reflux disease)   . Headache    migraines  . Herpes   . Hypertension   . Liver lesion, right lobe 12/01/2015   CT chest without contrast 11/30/15 with hepatic lesions measuring 2.0 x 1.7 cm of the right lobe and 1 x 1 cm on the dome. CT abd/pel 8/18: 1.3cm inferior R liver lobe, prob benign but recommend 3-27mo f/u with MRI w/wo IV contrast with attenuation MRI 6/19: combination of benign cavernous hemangiomas and small benign cysts  . Poor dental hygiene    chipped upper front and several loose teeth  . Substance abuse ([place])    cocaine/crack cocaine - last use 02/07/18  . SVD (spontaneous vaginal delivery)    x 1   . Uses walker    for ambulation  . Uterine leiomyoma 02/16/2016   s/p vaginal hysterectomy 2018   Review of Systems:   ROS negative except as noted in HPI   Physical Exam:  Constitution: NAD, appears stated age Cardio: RRR, distant heart sounds, no m/r/g  Respiratory: clear to auscultation, non-labored breathing  Abdominal: TTP epigastric region, +BS, soft, non-distended MSK: uses rolling walker, difficulty rising from chair and ambulating   Vitals:   11/24/18 1053 11/24/18 1055  BP:  (!) 157/73  Pulse:  67  Temp:  98.6 F (37 C)  TempSrc:  Oral  SpO2:  98%  Weight: 148 lb 9.6 oz (67.4 kg)   Height: 5\' 4"  (1.626 m)      Assessment & Plan:   See Encounters Tab for problem based charting.  Patient discussed with Dr. Angelia Mould

## 2018-11-24 NOTE — Assessment & Plan Note (Addendum)
below presenting for refractory symptoms of chest and epigastric burning despite ppi therapy. She was previously on ranitidine, which she states helped. Once this was recalled she was switched to protonix 40 mg qd with pepcid. She continues to have symptoms after she eats, especially cold cuts, chips, hamburgers. The pain does not radiate anywhere. She has no jaw pain, left arm numbness or tingling. The pain does not worsen with exertion. She denies n/v. It is worse at night. She also has difficulty swallowing solids where it feels like they're stuck in her throat. This does not happen with liquids. She has constipation which she takes stool softeners for, these do help. She denies melena or BRB in her stool or other abdominal pain. She smokes 1 cigarette per day and occasionally has a beer.  Her symptoms are consistent with refractory GERD. Although she may need to increase therapy for her GERD to BID, I counseled her that sometimes ppi's can take up to two months to be effective. However, with her dysphagia I think she does warrant GI referral.   - referral placed to GI  - continue protonix and PPI 40 mg qd - continue famotidine  - given information on lifestyle modifications

## 2018-11-25 ENCOUNTER — Telehealth: Payer: Self-pay | Admitting: Pharmacist

## 2018-11-25 DIAGNOSIS — F320 Major depressive disorder, single episode, mild: Secondary | ICD-10-CM

## 2018-11-25 NOTE — Progress Notes (Signed)
Contacted patient to help with medication cost. She states she cannot afford the Medicaid copays. Unfortunately, patient will not qualify for free medication programs due to Medicaid, but I advised patient to try an independent pharmacy.  For now, will supply patient with samples of amlodipine and HCTZ along with help with medication copays, appointment scheduled for 7/23. Patient verbalized understanding.

## 2018-11-26 ENCOUNTER — Telehealth: Payer: Self-pay | Admitting: Licensed Clinical Social Worker

## 2018-11-26 ENCOUNTER — Encounter: Payer: Self-pay | Admitting: Licensed Clinical Social Worker

## 2018-11-26 NOTE — Progress Notes (Signed)
Internal Medicine Clinic Attending  Case discussed with Dr. Seawell at the time of the visit.  We reviewed the resident's history and exam and pertinent patient test results.  I agree with the assessment, diagnosis, and plan of care documented in the resident's note.    

## 2018-11-26 NOTE — Telephone Encounter (Signed)
Patient was called (1st attempt) due to a referral from Dr. Maudie Mercury. Patient did not answer, and vm was not available.  A letter will also be mailed today to the patient.

## 2018-11-27 ENCOUNTER — Other Ambulatory Visit: Payer: Self-pay

## 2018-11-27 ENCOUNTER — Ambulatory Visit: Payer: Medicaid Other | Admitting: Pharmacist

## 2018-11-27 DIAGNOSIS — Z79899 Other long term (current) drug therapy: Secondary | ICD-10-CM

## 2018-11-27 NOTE — Progress Notes (Signed)
Medication Samples have been provided to the patient.  Drug: amlodipine Strength: 10 mg Qty: 15  LOT: [id number] Exp.Date: 11/2019 Dosing instructions: 1 tablet daily Drug: HCTZ Strength: 25 mg Qty: 14  LOT: [id number] Exp.Date: June 2021 Dosing instructions: 1 tablet daily  The patient has been instructed regarding the correct time, dose, and frequency of taking this medication, including desired effects and most common side effects. Patient advised to contact clinic if any questions arise and verbalized understanding by repeat back.  Jean [name] 11:34 AM 11/27/2018

## 2018-12-02 ENCOUNTER — Encounter: Payer: Self-pay | Admitting: Internal Medicine

## 2018-12-02 ENCOUNTER — Other Ambulatory Visit: Payer: Self-pay

## 2018-12-02 ENCOUNTER — Ambulatory Visit (INDEPENDENT_AMBULATORY_CARE_PROVIDER_SITE_OTHER): Payer: Medicaid Other | Admitting: Internal Medicine

## 2018-12-02 DIAGNOSIS — J449 Chronic obstructive pulmonary disease, unspecified: Secondary | ICD-10-CM

## 2018-12-02 DIAGNOSIS — K219 Gastro-esophageal reflux disease without esophagitis: Secondary | ICD-10-CM

## 2018-12-02 DIAGNOSIS — R0609 Other forms of dyspnea: Secondary | ICD-10-CM

## 2018-12-02 MED ORDER — BUDESONIDE-FORMOTEROL FUMARATE 80-4.5 MCG/ACT IN AERO: 2.0000 | INHALATION_SPRAY | Freq: Two times a day (BID) | RESPIRATORY_TRACT | 0 refills | Status: DC

## 2018-12-02 MED ORDER — PANTOPRAZOLE SODIUM 40 MG PO TBEC: DELAYED_RELEASE_TABLET | ORAL | 0 refills | Status: DC

## 2018-12-02 MED ORDER — BUDESONIDE-FORMOTEROL FUMARATE 80-4.5 MCG/ACT IN AERO: 2.0000 | INHALATION_SPRAY | Freq: Two times a day (BID) | RESPIRATORY_TRACT | 11 refills | Status: DC

## 2018-12-02 NOTE — Patient Instructions (Addendum)
Plan A = Automatic = symbicort 80 Take 2 puffs first thing in am and then another 2 puffs about 12 hours later.   Work on inhaler technique:  relax and gently blow all the way out then take a nice smooth deep breath back in, triggering the inhaler at same time you start breathing in.  Hold for up to 5 seconds if you can. Blow out thru nose. Rinse and gargle with water when done      Plan B = Backup Only use your albuterol inhaler as a rescue medication to be used if you can't catch your breath by resting or doing a relaxed purse lip breathing pattern.  - The less you use it, the better it will work when you need it. - Ok to use the inhaler up to 2 puffs  every 4 hours if you must but call for appointment if use goes up over your usual need - Don't leave home without it !!  (think of it like the spare tire for your car)   Stop pepcid   Protonix 40 Take 30- 60 min before your first and last meals of the day   GERD (REFLUX)  is an extremely common cause of respiratory symptoms just like yours , many times with no obvious heartburn at all.    It can be treated with medication, but also with lifestyle changes including elevation of the head of your bed (ideally with 6 -8inch blocks under the headboard of your bed),  Smoking cessation, avoidance of late meals, excessive alcohol, and avoid fatty foods, chocolate, peppermint, colas, red wine, and acidic juices such as orange juice.  NO MINT OR MENTHOL PRODUCTS SO NO COUGH DROPS  USE SUGARLESS CANDY INSTEAD (Jolley ranchers or Stover's or Life Savers) or even ice chips will also do - the key is to swallow to prevent all throat clearing. NO OIL BASED VITAMINS - use powdered substitutes.  Avoid fish oil when coughing.       Please schedule a follow up office visit in 6 weeks, call sooner if needed

## 2018-12-02 NOTE — Progress Notes (Signed)
Jean [name], female    DOB: 07-13-1965,    MRN: [id number]   Brief patient profile:  47 yobf  ? 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 Pulmonary Consult    Referred by Dr. Joni Reining. Pt c/o SOB x 6 months- SOB with or without any exertion. She had PFT's in Feb 2020. She is using her albuterol inhaler several times per day.  Dyspnea:  At rest x 6 months Cough: none Sleep: fine on back / one pillow  SABA use: no better with inhaler but still uses up to bid "cause they said to"  Assoc bad heartburn rec Pantoprazole (protonix) 40 mg   Take  30-60 min before first meal of the day and Pepcid (famotidine)  20 mg one after supper   X  One month trial  GERD diet  The key is to stop smoking completely before smoking completely stops you! Please remember to go to the lab and x-ray department   for your tests - we will call you with the results when they are available.    12/02/2018  f/u ov/[facility]ert re: doe ? Etiology/ stopped smoking / on nicotine patch Chief Complaint  Patient presents with  . Follow-up    Breathing is unchanged. She is using her albuterol inhaler 5 x daily on average.   Dyspnea:  Uncomfortable at rest / MMRC3 = can't walk 100 yards even at a slow pace at a flat grade s stopping due to sob   Cough: none Sleeping: breathing is fine lying down flat bed 2 pillows  SABA use: already used 4 pffs this am p stirring  02: none  Midline p x 3 years comes and goes shortest = 10 mins  And one hour most, seems worse with potato chips no pleuritic or ex  related  pepcid made it worse    No obvious day to day or daytime variability or assoc excess/ purulent sputum or mucus plugs or hemoptysis or cp or chest tightness, subjective wheeze or overt sinus   symptoms.   Sleeping  without nocturnal  or early am exacerbation  of respiratory  c/o's or need for noct saba. Also denies any obvious fluctuation of symptoms with weather or environmental changes or other aggravating or alleviating factors except as outlined above   No unusual exposure hx or h/o childhood pna/ asthma or knowledge of premature birth.  Current Allergies, Complete Past Medical History, Past Surgical History, Family History, and Social History were reviewed in [employer] record.  ROS  The following are not active complaints unless bolded Hoarseness, sore throat, dysphagia, dental problems, itching, sneezing,  nasal congestion or discharge of excess mucus or purulent secretions, ear ache,   fever, chills, sweats, unintended wt loss or wt gain, classically pleuritic or exertional cp,  orthopnea pnd or arm/hand swelling  or leg swelling, presyncope, palpitations, abdominal pain, anorexia, nausea, vomiting, diarrhea  or change in bowel habits or change in bladder habits, change in stools or change in urine,  dysuria, hematuria,  rash, arthralgias, visual complaints, headache, numbness, weakness or ataxia or problems with walking or coordination,  change in mood or  memory.        Current Meds  Medication Sig  . amLODipine (NORVASC) 10 MG tablet Take 1 tablet (10 mg total) by mouth daily.  . cyclobenzaprine (FLEXERIL) 10 MG tablet TAKE 1 Tablet  BY MOUTH TWICE DAILY AS NEEDED FOR MUSCLE SPASMS  . divalproex (DEPAKOTE ER) 250 MG 24 hr tablet Take 3 tablets (750 mg total) by mouth daily.  . famotidine (PEPCID) 20 MG tablet Take 1 tablet (20 mg total) by mouth daily as needed for heartburn or indigestion.  . gabapentin (NEURONTIN) 300 MG capsule Take 1 capsule (300 mg  total) by mouth at bedtime.  . hydrochlorothiazide (HYDRODIURIL) 25 MG tablet Take 1 tablet (25 mg total) by mouth daily.  . nicotine (NICODERM CQ - DOSED IN MG/24 HR) 7 mg/24hr patch Place 7 mg onto the skin daily.  . pantoprazole (PROTONIX) 40 MG tablet Take 1 tablet (40 mg total) by mouth daily. Take 30-68min before first meal of day  . PROVENTIL HFA 108 (90 Base) MCG/ACT inhaler INHALE 2 PUFFS BY MOUTH EVERY 6 HOURS AS NEEDED FOR COUGHING, WHEEZING, OR SHORTNESS OF BREATH  . sertraline (ZOLOFT) 100 MG tablet TAKE 1 Tablet BY MOUTH ONCE DAILY  . tetrahydrozoline 0.05 % ophthalmic solution Place 1 drop into both eyes daily as needed (for dry eyes).              Past Medical History:  Diagnosis Date  . Adnexal mass 2019   likely remnant fibroid on broad ligament; getting diagnostic lap  . Anxiety   . Arthritis    hip  . Asthma   . Carpal tunnel syndrome    bilateral  . Chronic lower back pain   . Depression   . Dyspnea    with exertion  . GERD (gastroesophageal reflux disease)   . Headache    migraines  . Herpes   . Hypertension   . Liver lesion, right lobe 12/01/2015   CT chest without contrast 11/30/15 with hepatic lesions measuring 2.0 x 1.7 cm of the right lobe and 1 x 1 cm on the dome. CT abd/pel 8/18: 1.3cm inferior R liver lobe, prob benign but recommend 3-65mo f/u with MRI w/wo IV contrast with attenuation MRI 6/19: combination of benign cavernous hemangiomas and small benign cysts  . Poor dental hygiene    chipped upper front and several loose teeth  . Substance abuse ([place])    cocaine/crack cocaine - last use 02/07/18  . SVD (spontaneous vaginal delivery)    x 1  . Uses walker    for ambulation  . Uterine leiomyoma 02/16/2016   s/p vaginal hysterectomy 2018        Objective:     amb bf uses rollator  Wt Readings from Last 3 Encounters:  12/02/18 148 lb 6.4 oz (67.3 kg)  11/24/18 148 lb 9.6 oz (67.4 kg)  11/04/18 147 lb (66.7 kg)     Vital signs reviewed  - Note on arrival 02 sats  100% on RA     HEENT: nl dentition, turbinates bilaterally, and oropharynx. Nl external ear canals without cough reflex   NECK :  without JVD/Nodes/TM/ nl carotid upstrokes bilaterally   LUNGS: no acc muscle use,  Nl contour chest which is clear to A and P bilaterally without cough on insp or exp maneuvers   CV:  RRR  no s3 or murmur or increase in P2, and no edema   ABD:  soft and nontender with nl inspiratory excursion in the supine position. No bruits or organomegaly appreciated, bowel sounds nl  MS:  Nl gait/ ext warm without deformities, calf tenderness, cyanosis or clubbing No obvious joint restrictions   SKIN: warm and dry without lesions    NEURO:  alert, approp, nl sensorium with  no motor or cerebellar deficits apparent.              Assessment

## 2018-12-05 ENCOUNTER — Telehealth: Payer: Self-pay | Admitting: Internal Medicine

## 2018-12-05 NOTE — Telephone Encounter (Signed)
Attending on order is Kieth Brightly given to [facility], also wanted to inform [facility] that they will start services on 12/10/18.Regenia Skeeter, Darlene Cassady7/31/202011:35 AM

## 2018-12-05 NOTE — Telephone Encounter (Signed)
Received called from [facility].They will start services on August 5,2020 for patient Jean [name] C7/31/202011:32 AM

## 2018-12-05 NOTE — Telephone Encounter (Signed)
Rtc, was told Jean [name] is in a meeting, left vm for rtc to triage

## 2018-12-05 NOTE — Telephone Encounter (Signed)
Per Darlina Guys would like the attending physician [phone]

## 2018-12-06 ENCOUNTER — Encounter: Payer: Self-pay | Admitting: Internal Medicine

## 2018-12-06 DIAGNOSIS — J449 Chronic obstructive pulmonary disease, unspecified: Secondary | ICD-10-CM | POA: Insufficient documentation

## 2018-12-06 NOTE — Assessment & Plan Note (Signed)
Assoc with atypical cp onset around 2017  >>>> Made worse by swallowing foods like potato chips ? Es spasm > try max rx for gerd with ppi and diet and refer to gi prn  I had an extended discussion with the patient reviewing all relevant studies completed to date and  lasting 15 to 20 minutes of a 25 minute visit    I performed detailed device teaching using a teach back method which extended face to face time for this visit (see above)  Each maintenance medication was reviewed in detail including emphasizing most importantly the difference between maintenance and prns and under what circumstances the prns are to be triggered using an action plan format that is not reflected in the computer generated alphabetically organized AVS which I have not found useful in most complex patients, especially with respiratory illnesses  Please see AVS for specific instructions unique to this visit that I personally wrote and verbalized to the the pt in detail and then reviewed with pt  by my nurse highlighting any  changes in therapy recommended at today's visit to their plan of care.

## 2018-12-06 NOTE — Assessment & Plan Note (Signed)
Quit smoking 11/2018 - PFT's  10/29/2018  FEV1 2.08 (93 % ) ratio 0.84  p 0 % improvement from saba p ? prior to study with DLCO  78 % corrects to 93 % for alv volume and 20% improvement in FVC p saba with incomplete exhalation - 12/02/2018  After extensive coaching inhaler device,  effectiveness =    75% > try symbicort 80 2bid   She is way overusing saba suggestive that there is AB component so try symb 80 2bid to see if better symptom control and less need for saba    Re: over use of saba  I spent extra time with pt today reviewing appropriate use of albuterol for prn use on exertion with the following points: 1) saba is for relief of sob that does not improve by walking a slower pace or resting but rather if the pt does not improve after trying this first. 2) If the pt is convinced, as many are, that saba helps recover from activity faster then it's easy to tell if this is the case by re-challenging : ie stop, take the inhaler, then p 5 minutes try the exact same activity (intensity of workload) that just caused the symptoms and see if they are substantially diminished or not after saba 3) if there is an activity that reproducibly causes the symptoms, try the saba 15 min before the activity on alternate days   If in fact the saba really does help, then fine to continue to use it prn but advised may need to look closer at the maintenance regimen being used to achieve better control of airways disease with exertion.

## 2018-12-06 NOTE — Assessment & Plan Note (Signed)
Echo 11/11/18   1. The left ventricle has hyperdynamic systolic function, with an ejection fraction of >65%. The cavity size was normal. There is moderately increased left ventricular wall thickness. Left ventricular diastolic Doppler parameters are consistent with  impaired relaxation.  2. The right ventricle has normal systolic function. The cavity was normal. There is no increase in right ventricular wall thickness.  3. The aortic valve is tricuspid. Moderate calcification of the aortic valve with nodular calcification on the right coronary cusp. No stenosis of the aortic valve.  4. The aortic root and ascending aorta are normal in size and structure.  5. Normal IVC size. No complete TR doppler jet so unable to estimate PA systolic pressure.  6. No evidence of mitral valve stenosis. No significant mitral regurgitation.  No evidence of a cardiac etiology > see COPD

## 2018-12-09 ENCOUNTER — Encounter: Payer: Self-pay | Admitting: Orthopaedic Surgery

## 2018-12-09 ENCOUNTER — Other Ambulatory Visit: Payer: Self-pay

## 2018-12-09 ENCOUNTER — Ambulatory Visit (INDEPENDENT_AMBULATORY_CARE_PROVIDER_SITE_OTHER): Payer: Medicaid Other

## 2018-12-09 ENCOUNTER — Telehealth: Payer: Self-pay | Admitting: Licensed Clinical Social Worker

## 2018-12-09 ENCOUNTER — Ambulatory Visit (INDEPENDENT_AMBULATORY_CARE_PROVIDER_SITE_OTHER): Payer: Medicaid Other | Admitting: Orthopaedic Surgery

## 2018-12-09 VITALS — Ht 64.0 in | Wt 150.0 lb

## 2018-12-09 DIAGNOSIS — M542 Cervicalgia: Secondary | ICD-10-CM

## 2018-12-09 DIAGNOSIS — F320 Major depressive disorder, single episode, mild: Secondary | ICD-10-CM

## 2018-12-09 NOTE — Progress Notes (Signed)
Office Visit Note   Patient: Jean [name]           Date of Birth: 1965/09/02           MRN: [id number] Visit Date: 12/09/2018              Requested by: Jean Persia, MD [address] [place],  [place] [address] PCP: Jean Persia, MD   Assessment & Plan: Visit Diagnoses:  1. Neck pain   2. Current mild episode of major depressive disorder, unspecified whether recurrent ([place])     Plan: Patient is under a lot of stress she states she has to stay with someone due to financial constraints.  This puts a lot of stress on her.  No isolated motor weakness notes radiculopathy.  MRI scan is reviewed which does show some levels of foraminal narrowing at several levels.  She has more left C5 foraminal stenosis.  More symptoms on her right and also some severe C7-C8 foraminal narrowing without motor weakness no nerve root tension signs.  We discussed stress and depression can make a lot of her symptoms worse.  Should try to work on a walking program tried exercise some help reduce stress.  We reviewed the MRI images I discussed with her at this point no indication for operative intervention.  Return PRN.  Follow-Up Instructions: Return if symptoms worsen or fail to improve.   Orders:  Orders Placed This Encounter  Procedures   XR Cervical Spine 2 or 3 views   No orders of the defined types were placed in this encounter.     Procedures: No procedures performed   Clinical Data: No additional findings.   Subjective: Chief Complaint  Patient presents with   Neck - Pain   Right Arm - Pain    HPI 53 year old female referred by Dr. Ninfa [name] for evaluation of the neck pain.  She has significant major depression disorder is using a rolling walker is on disability and states she has had 6 episodes of falling.  She does not work she grabs her neck with both hands and states she has neck pain.  EMGs nerve conduction velocities have been done in the upper extremities  which are entirely normal.  MRI scan was done 2018 and is reviewed with her today.  Review of Systems   Objective: Vital Signs: Ht 5\' 4"  (1.626 m)    Wt 150 lb (68 kg)    LMP  (LMP Unknown)    BMI 25.75 kg/m   Physical Exam HENT:     Head: Normocephalic.     Comments: Subcutaneous nodule of the forehead without ecchymosis.    Nose: Nose normal.     Mouth/Throat:     Mouth: Mucous membranes are moist.  Eyes:     Extraocular Movements: Extraocular movements intact.     Pupils: Pupils are equal, round, and reactive to light.  Neck:     Musculoskeletal: Normal range of motion.  Cardiovascular:     Rate and Rhythm: Normal rate.     Pulses: Normal pulses.  Pulmonary:     Effort: Pulmonary effort is normal.     Breath sounds: No wheezing.  Abdominal:     Tenderness: There is no abdominal tenderness.  Skin:    General: Skin is warm.     Capillary Refill: Capillary refill takes less than 2 seconds.  Neurological:     General: No focal deficit present.     Mental Status: She is alert.  Ortho Exam patient has abnormal gait complains of right knee giving way but has a left knee limp.  She has tremulousness shakiness.  She states she can walk without the walker but is not able to demonstrate that in the office today.  No quad atrophy, normal hip range of motion without pain she has some giving way resistance and cogwheel resistance that resolves with encouragement.  Negative popliteal compression test no sciatic notch tenderness.  No supraclavicular lymphadenopathy extremity reflexes are 2+ and symmetrical.  Biceps triceps shoulder internal/external rotation pronation extension supination grip per fundi several my wrist extension and flexion are all normal 5 out of 5 and symmetrical.  2+ knee and ankle jerk.  Specialty Comments:  No specialty comments available.  Imaging: Xr Cervical Spine 2 Or 3 Views  Result Date: 12/09/2018 AP lateral cervical spine x-rays demonstrate mild  anterior spurring at C5-6 without significant loss of disc space height negative for acute changes. Impression: Mild cervical spondylosis with spurring at C5-6.    PMFS History: Patient Active Problem List   Diagnosis Date Noted   COPD GOLD 0 ? 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   GERD (gastroesophageal reflux disease)    Cigarette smoker 10/29/2018   Breast pain 06/23/2018   Post-operative state 03/26/2018   Vaginal discharge 03/26/2018   Dyspareunia in female 09/13/2017   Cervicalgia 04/24/2017   CKD (chronic kidney disease) 10/29/2016   Chest wall pain 10/15/2016   Chronic pain syndrome 05/10/2016   MDD (major depressive disorder) 10/19/2015   Dyspnea on exertion 08/22/2015   HTN (hypertension) 07/08/2015   Routine health maintenance 07/08/2015   Tobacco abuse 07/08/2015   Past Medical History:  Diagnosis Date   Adnexal mass 2019   likely remnant fibroid on broad ligament; getting diagnostic lap   Anxiety    Arthritis    hip   Asthma    Carpal tunnel syndrome    bilateral   Chronic lower back pain    Depression    Dyspnea    with exertion   GERD (gastroesophageal reflux disease)    Headache    migraines   Herpes    Hypertension    Liver lesion, right lobe 12/01/2015   CT chest without contrast 11/30/15 with hepatic lesions measuring 2.0 x 1.7 cm of the right lobe and 1 x 1 cm on the dome. CT abd/pel 8/18: 1.3cm inferior R liver lobe, prob benign but recommend 3-25mo f/u with MRI w/wo IV contrast with attenuation MRI 6/19: combination of benign cavernous hemangiomas and small benign cysts   Poor dental hygiene    chipped upper front and several loose teeth   Substance abuse ([place])    cocaine/crack cocaine - last use 02/07/18   SVD (spontaneous vaginal delivery)    x 1   Uses walker    for ambulation   Uterine leiomyoma 02/16/2016   s/p vaginal hysterectomy 2018    Family History    Problem Relation Age of Onset   Cancer Mother    Hypertension Mother    Diabetes Mother    Breast cancer Mother        diagnosed in her 1's   Stroke Father     Past Surgical History:  Procedure Laterality Date   LAPAROSCOPIC OVARIAN CYSTECTOMY Right 02/25/2018   Procedure: LAPAROSCOPIC REMOVAL OF RIGHT ADNEXAL MASS;  Surgeon: Chancy Milroy, MD;  Location: [facility];  Service: Gynecology;  Laterality: Right;   SALPINGECTOMY  Left    lap - ectopic preg   TOTAL HIP ARTHROPLASTY Left 01/20/2016   Procedure: LEFT TOTAL HIP ARTHROPLASTY ANTERIOR APPROACH;  Surgeon: Mcarthur Rossetti, MD;  Location: [facility];  Service: Orthopedics;  Laterality: Left;   TUBAL LIGATION     VAGINAL HYSTERECTOMY Right 12/11/2016   Procedure: HYSTERECTOMY VAGINAL WITH RIGHT SALPINGECTOMY;  Surgeon: Chancy Milroy, MD;  Location:  [facility];  Service: Gynecology;  Laterality: Right;   Social History   Occupational History   Not on file  Tobacco Use   Smoking status: Former Smoker    Packs/day: 0.50    Years: 29.00    Pack years: 14.50    Types: Cigarettes    Quit date: 11/25/2018    Years since quitting: 0.0   Smokeless tobacco: Never Used  Substance and Sexual Activity   Alcohol use: Yes    Alcohol/week: 3.0 standard drinks    Types: 3 Cans of beer per week    Comment: Occasional Beer    Drug use: No    Types: "Crack" cocaine, Cocaine    Comment: Last use 2 wks ago 02/07/18   Sexual activity: Yes    Partners: Male    Birth control/protection: None, Post-menopausal    Comment: Hysterectomy

## 2018-12-09 NOTE — Telephone Encounter (Signed)
Patient was called to discuss the referral from her physician (2nd attempt). Patient did not answer, and a no vm was available.

## 2018-12-11 ENCOUNTER — Telehealth: Payer: Self-pay | Admitting: Licensed Clinical Social Worker

## 2018-12-11 ENCOUNTER — Ambulatory Visit: Payer: Medicaid Other | Admitting: Licensed Clinical Social Worker

## 2018-12-11 NOTE — Addendum Note (Signed)
Addended by: Hulan Fray on: 12/11/2018 02:41 PM   Modules accepted: Orders

## 2018-12-11 NOTE — Telephone Encounter (Signed)
Patient was contacted for her scheduled appointment. Patient did not answer, and there is no vm available.

## 2018-12-11 NOTE — Telephone Encounter (Signed)
Received message from Jean [name] :Agency has made multiple attempts to contact patient.They were able to get visits for her after reassement.PT attempted to see patient for visit,patient is no longer answering the phone.They went to the house not able to locate patient.They will close this out [place], [place] C8/6/20202:17 PM

## 2018-12-17 ENCOUNTER — Ambulatory Visit: Payer: Medicaid Other | Admitting: Gastroenterology

## 2018-12-17 NOTE — Addendum Note (Signed)
Addended by: Hulan Fray on: 12/17/2018 07:47 PM   Modules accepted: Orders

## 2018-12-18 NOTE — Addendum Note (Signed)
Addended by: Hulan Fray on: 12/18/2018 05:09 PM   Modules accepted: Orders

## 2018-12-22 ENCOUNTER — Emergency Department (HOSPITAL_COMMUNITY)
Admission: EM | Admit: 2018-12-22 | Discharge: 2018-12-22 | Disposition: A | Discharge status: Home / Self Care | Payer: Medicaid Other | Attending: Emergency Medicine | Admitting: Emergency Medicine

## 2018-12-22 ENCOUNTER — Other Ambulatory Visit: Payer: Self-pay

## 2018-12-22 DIAGNOSIS — G8929 Other chronic pain: Secondary | ICD-10-CM

## 2018-12-22 DIAGNOSIS — F419 Anxiety disorder, unspecified: Secondary | ICD-10-CM | POA: Diagnosis not present

## 2018-12-22 DIAGNOSIS — I129 Hypertensive chronic kidney disease with stage 1 through stage 4 chronic kidney disease, or unspecified chronic kidney disease: Secondary | ICD-10-CM | POA: Insufficient documentation

## 2018-12-22 DIAGNOSIS — M542 Cervicalgia: Secondary | ICD-10-CM | POA: Insufficient documentation

## 2018-12-22 DIAGNOSIS — Z96642 Presence of left artificial hip joint: Secondary | ICD-10-CM | POA: Insufficient documentation

## 2018-12-22 DIAGNOSIS — N189 Chronic kidney disease, unspecified: Secondary | ICD-10-CM | POA: Insufficient documentation

## 2018-12-22 DIAGNOSIS — Z87891 Personal history of nicotine dependence: Secondary | ICD-10-CM | POA: Diagnosis not present

## 2018-12-22 DIAGNOSIS — J45909 Unspecified asthma, uncomplicated: Secondary | ICD-10-CM | POA: Diagnosis not present

## 2018-12-22 DIAGNOSIS — G894 Chronic pain syndrome: Secondary | ICD-10-CM | POA: Diagnosis not present

## 2018-12-22 DIAGNOSIS — Z79899 Other long term (current) drug therapy: Secondary | ICD-10-CM | POA: Insufficient documentation

## 2018-12-22 DIAGNOSIS — F418 Other specified anxiety disorders: Secondary | ICD-10-CM

## 2018-12-22 DIAGNOSIS — J449 Chronic obstructive pulmonary disease, unspecified: Secondary | ICD-10-CM | POA: Diagnosis not present

## 2018-12-22 MED ORDER — CYCLOBENZAPRINE HCL 10 MG PO TABS: 10.0000 mg | ORAL_TABLET | Freq: Two times a day (BID) | ORAL | 0 refills | Status: DC | PRN

## 2018-12-22 NOTE — ED Notes (Signed)
Pt is aware that social work is coming, but she did not want to stay.  She states she will make an appointment

## 2018-12-22 NOTE — ED Provider Notes (Signed)
[facility] DEPARTMENT Provider Note   CSN: [id number] Arrival date & time: 12/22/18  1008    History   Chief Complaint No chief complaint on file.   HPI Jean [name] is a 53 y.o. female with a past medical history of hypertension, substance abuse specifically cocaine, anxiety, COPD, nicotine dependence, chronic neck pain, tension headaches, who presents today for evaluation of right-sided neck and arm pain.  She reports that the symptoms have been present for many years however they are slightly worse today.  She was seen and evaluated by orthopedics Dr. Lorin Mercy for these in the past 2 weeks.  She reports that she is continuing to take all of her medicines however she is almost out of her Flexeril and is requesting a refill.  She denies any new trauma since she was evaluated by orthopedics.  She has chronic headaches, reports that her headache today is consistent with her usual headache and not changed, different, or concerning in any way.  No weakness or visual changes.   She also tells me that she has significant anxiety about her health.  She states to me that she has been told that even though she is 20 she has "the body of a 50 or 53 year old" and asks me if that means she is going to die soon.  She says that that has been causing her significant anxiety for "a long time."  She denies any specific other symptoms or complaints, does not have specific feeling of impending doom rather is generally anxious about her overall health.  She does not partake in any stress reduction activities.  She has previously spoken with a counselor on the phone and chart review shows they attempted to reach out with her to schedule follow-up appointments however she did not answer.     HPI  Past Medical History:  Diagnosis Date  . Adnexal mass 2019   likely remnant fibroid on broad ligament; getting diagnostic lap  . Anxiety   . Arthritis    hip  . Asthma   . Carpal tunnel  syndrome    bilateral  . Chronic lower back pain   . Depression   . Dyspnea    with exertion  . GERD (gastroesophageal reflux disease)   . Headache    migraines  . Herpes   . Hypertension   . Liver lesion, right lobe 12/01/2015   CT chest without contrast 11/30/15 with hepatic lesions measuring 2.0 x 1.7 cm of the right lobe and 1 x 1 cm on the dome. CT abd/pel 8/18: 1.3cm inferior R liver lobe, prob benign but recommend 3-74mo f/u with MRI w/wo IV contrast with attenuation MRI 6/19: combination of benign cavernous hemangiomas and small benign cysts  . Poor dental hygiene    chipped upper front and several loose teeth  . Substance abuse ([place])    cocaine/crack cocaine - last use 02/07/18  . SVD (spontaneous vaginal delivery)    x 1  . Uses walker    for ambulation  . Uterine leiomyoma 02/16/2016   s/p vaginal hysterectomy 2018    Patient Active Problem List   Diagnosis Date Noted  . COPD GOLD 0 ? AB  12/06/2018  . 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 GERD (gastroesophageal reflux disease)   . Cigarette smoker 10/29/2018  . Breast pain 06/23/2018  . Post-operative state 03/26/2018  . Vaginal discharge 03/26/2018  . Dyspareunia in female 09/13/2017  . Cervicalgia 04/24/2017  .  CKD (chronic kidney disease) 10/29/2016  . Chest wall pain 10/15/2016  . Chronic pain syndrome 05/10/2016  . MDD (major depressive disorder) 10/19/2015  . Dyspnea on exertion 08/22/2015  . HTN (hypertension) 07/08/2015  . Routine health maintenance 07/08/2015  . Tobacco abuse 07/08/2015    Past Surgical History:  Procedure Laterality Date  . LAPAROSCOPIC OVARIAN CYSTECTOMY Right 02/25/2018   Procedure: LAPAROSCOPIC REMOVAL OF RIGHT ADNEXAL MASS;  Surgeon: Chancy Milroy, MD;  Location: [facility];  Service: Gynecology;  Laterality: Right;  . SALPINGECTOMY Left    lap - ectopic preg  . TOTAL HIP ARTHROPLASTY Left 01/20/2016   Procedure: LEFT TOTAL HIP ARTHROPLASTY  ANTERIOR APPROACH;  Surgeon: Mcarthur Rossetti, MD;  Location: [facility];  Service: Orthopedics;  Laterality: Left;  . TUBAL LIGATION    . VAGINAL HYSTERECTOMY Right 12/11/2016   Procedure: HYSTERECTOMY VAGINAL WITH RIGHT SALPINGECTOMY;  Surgeon: Chancy Milroy, MD;  Location: [facility];  Service: Gynecology;  Laterality: Right;     OB History    Gravida  3   Para  1   Term  1   Preterm      AB  2   Living  1     SAB      TAB  1   Ectopic  1   Multiple      Live Births               Home Medications    Prior to Admission medications   Medication Sig Start Date End Date Taking? Authorizing Provider  amLODipine (NORVASC) 10 MG tablet Take 1 tablet (10 mg total) by mouth daily. 11/04/18 01/03/19  [place]om[place], Carley D, DO  budesonide-formoterol (SYMBICORT) 80-4.5 MCG/ACT inhaler Inhale 2 puffs into the lungs 2 (two) times daily. 12/02/18   Tanda Rockers, MD  cyclobenzaprine (FLEXERIL) 10 MG tablet Take 1 tablet (10 mg total) by mouth 2 (two) times daily as needed for muscle spasms. 12/22/18   Lorin Glass, PA-C  divalproex (DEPAKOTE ER) 250 MG 24 hr tablet Take 3 tablets (750 mg total) by mouth daily. 08/26/18   Garvin Fila, MD  gabapentin (NEURONTIN) 300 MG capsule Take 1 capsule (300 mg total) by mouth at bedtime. 04/26/18   Alphonzo Grieve, MD  hydrochlorothiazide (HYDRODIURIL) 25 MG tablet Take 1 tablet (25 mg total) by mouth daily. 11/26/17   Mosetta Anis, MD  nicotine (NICODERM CQ - DOSED IN MG/24 HR) 7 mg/24hr patch Place 7 mg onto the skin daily.    [provider]  pantoprazole (PROTONIX) 40 MG tablet Take 30- 60 min before your first and last meals of the day 12/02/18   Tanda Rockers, MD  PROVENTIL HFA 108 (90 Base) MCG/ACT inhaler INHALE 2 PUFFS BY MOUTH EVERY 6 HOURS AS NEEDED FOR COUGHING, WHEEZING, OR SHORTNESS OF BREATH 10/01/18   Lucious Groves, DO  sertraline (ZOLOFT) 100 MG tablet TAKE 1 Tablet BY MOUTH ONCE DAILY 10/01/18   Lucious Groves, DO  tetrahydrozoline 0.05 % ophthalmic solution Place 1 drop into both eyes daily as needed (for dry eyes).     [provider]    Family History Family History  Problem Relation Age of Onset  . Cancer Mother   . Hypertension Mother   . Diabetes Mother   . Breast cancer Mother        diagnosed in her 58's  . Stroke Father     Social History Social History   Tobacco  Use  . Smoking status: Former Smoker    Packs/day: 0.50    Years: 29.00    Pack years: 14.50    Types: Cigarettes    Quit date: 11/25/2018    Years since quitting: 0.0  . Smokeless tobacco: Never Used  Substance Use Topics  . Alcohol use: Yes    Alcohol/week: 3.0 standard drinks    Types: 3 Cans of beer per week    Comment: Occasional Beer   . Drug use: No    Types: "Crack" cocaine, Cocaine    Comment: Last use 2 wks ago 02/07/18     Allergies   Lisinopril   Review of Systems Review of Systems  Constitutional: Negative for chills and fever.  HENT: Negative for congestion.   Eyes: Negative for visual disturbance.  Respiratory: Negative for chest tightness and shortness of breath.   Cardiovascular: Negative for chest pain and palpitations.  Gastrointestinal: Negative for abdominal pain, diarrhea and nausea.  Genitourinary: Negative for difficulty urinating and dysuria.  Musculoskeletal: Positive for back pain and neck pain.  Skin: Negative for color change, rash and wound.  Neurological: Positive for headaches. Negative for dizziness and weakness.  Psychiatric/Behavioral: Negative for suicidal ideas. The patient is nervous/anxious.   All other systems reviewed and are negative.    Physical Exam Updated Vital Signs BP (!) 156/105 (BP Location: Right Arm)   Pulse 86   Temp 98.4 F (36.9 C) (Oral)   Resp 18   LMP  (LMP Unknown)   SpO2 98%   Physical Exam Vitals signs and nursing note reviewed.  Constitutional:      General: She is not in acute distress.    Appearance: She  is not ill-appearing.  HENT:     Head: Normocephalic and atraumatic.  Eyes:     Conjunctiva/sclera: Conjunctivae normal.     Pupils: Pupils are equal, round, and reactive to light.  Neck:     Musculoskeletal: Normal range of motion and neck supple. No neck rigidity.  Cardiovascular:     Rate and Rhythm: Normal rate.     Pulses: Normal pulses.     Heart sounds: Normal heart sounds.  Pulmonary:     Effort: Pulmonary effort is normal. No respiratory distress.  Abdominal:     General: Abdomen is flat. There is no distension.     Tenderness: There is no abdominal tenderness.  Musculoskeletal:     Comments: There is diffuse tenderness to palpation over the right trapezius muscles specifically in the superior aspect.  Palpation here both re-creates and exacerbates her reported pain.  She has diffuse right-sided cervical and thoracic paraspinal muscle tenderness again consistent with trapezius muscle distribution.  Patient here both re-creates and exacerbates her reported pain.   5/5 strength in bilateral upper extremities.  Lymphadenopathy:     Cervical: No cervical adenopathy.  Skin:    General: Skin is warm and dry.  Neurological:     General: No focal deficit present.     Mental Status: She is alert and oriented to person, place, and time.     Cranial Nerves: No cranial nerve deficit.     Sensory: No sensory deficit (Sensation intact to light touch to right upper extremity.).     Motor: No weakness.     Coordination: Coordination normal (No evidence of involuntary movements or uncoordinated movements of the right upper extremity.).  Psychiatric:        Mood and Affect: Mood is anxious. Affect is tearful.  Speech: Speech normal.        Behavior: Behavior normal.        Thought Content: Thought content does not include homicidal or suicidal ideation. Thought content does not include homicidal or suicidal plan.      ED Treatments / Results  Labs (all labs ordered are listed,  but only abnormal results are displayed) Labs Reviewed - No data to display  EKG None  Radiology No results found.  Procedures Procedures (including critical care time)  Medications Ordered in ED Medications - No data to display   Initial Impression / Assessment and Plan / ED Course  I have reviewed the triage vital signs and the nursing notes.  Pertinent labs & imaging results that were available during my care of the patient were reviewed by me and considered in my medical decision making (see chart for details).  Clinical Course as of Dec 21 2049  Mon Dec 22, 2018  1206 Spoke with case management who will call patient.    [EH]    Clinical Course User Index [EH] Lorin Glass, PA-C      Patient presents today for evaluation of her chronic neck and right shoulder pain.  She has generalized tenderness to palpation in the distribution of the trapezius muscle, palpation here both re-creates and exacerbates her reported pain in those muscles feel tight on palpation.  Her pain is been going on for many years, she was recently seen and evaluated by orthopedics however has not scheduled a follow-up appointment.  I do not suspect an acute life-threatening emergency as her pain is essentially unchanged for months to years.  She states that she is running out of her Flexeril so I gave her a one-week refill of that.  I recommended that she follow-up with orthopedics, she is neurovascularly intact and I suspect muscle spasm is the primary underlying cause of her pain.  She has not had any trauma since her evaluation by orthopedics that would warrant imaging.  She also expresses significant anxiety about her health, and becomes tearful when discussing this.   I consulted case management who spoke with patient.  They were able to assist her in how to obtain transportation to and from appointments along with how to set up a appointment with her counselor.  Patient denies SI or HI.   Patient is given information on general stress management.  Return precautions were discussed with patient who states their understanding.  At the time of discharge patient denied any unaddressed complaints or concerns.  Patient is agreeable for discharge home.   Final Clinical Impressions(s) / ED Diagnoses   Final diagnoses:  Chronic neck pain  Chronic pain syndrome  Anxiety about health    ED Discharge Orders         Ordered    cyclobenzaprine (FLEXERIL) 10 MG tablet  2 times daily PRN     12/22/18 1303           Lorin Glass, Hershal Coria 12/22/18 2052    Charlesetta Shanks, MD 12/23/18 [phone]

## 2018-12-22 NOTE — ED Notes (Signed)
States saw dr Lorin Mercy 2 weeks ago but was given no meds, she was on gabapentin now pain in rt arm neck and shoulder worse

## 2018-12-22 NOTE — Progress Notes (Addendum)
TOC CM spoke to pt and states she was getting meds through [facility] when she did not have insurance. States she currently has Medicaid and need a Marketing executive that delivers. Explained [facility] and she states that pharmacy is close to her home. Explained she will need to call and schedule delivery of meds. Provided information on her counselor, Ms Miquel Dunn in [facility]. Explained they have made attempts to reach out to her to schedule appt. States she will call when she gets home to arrange appt. Provided pt with information on Medicaid transportation to call and set up with her CW. Jonnie Finner RN CCM Case Mgmt phone [phone]

## 2018-12-22 NOTE — Discharge Instructions (Signed)
Please schedule an appointment with your therapist.  If your symptoms worsen or you have additional concerns please seek additional medical care and evaluation.  If at any point you feel unsafe, develop thoughts about wanting to harm yourself or anyone else then please return to the emergency room.    It is important that you were performing gentle stretching and gentle range of motion exercises to help with your muscle tightness.  You are being prescribed a medication which may make you sleepy. For 24 hours after one dose please do not drive, operate heavy machinery, care for a small child with out another adult present, or perform any activities that may cause harm to you or someone else if you were to fall asleep or be impaired.

## 2018-12-22 NOTE — ED Triage Notes (Signed)
Pt c/o right neck and right arm pain x several months, worse in past 3 days.  Pt seen Dr. Lorin Mercy 12-09-18 for same.  Takes neurontin for pain but not effective.

## 2018-12-22 NOTE — ED Notes (Signed)
Consult SW as ordered she  Will come up in a few min

## 2019-01-13 ENCOUNTER — Ambulatory Visit: Payer: Medicaid Other | Admitting: Internal Medicine

## 2019-01-21 ENCOUNTER — Encounter: Payer: Medicaid Other | Admitting: Internal Medicine

## 2019-01-22 ENCOUNTER — Other Ambulatory Visit: Payer: Self-pay

## 2019-01-22 ENCOUNTER — Encounter: Payer: Self-pay | Admitting: Internal Medicine

## 2019-01-22 ENCOUNTER — Ambulatory Visit (INDEPENDENT_AMBULATORY_CARE_PROVIDER_SITE_OTHER): Payer: Medicaid Other | Admitting: Internal Medicine

## 2019-01-22 DIAGNOSIS — J449 Chronic obstructive pulmonary disease, unspecified: Secondary | ICD-10-CM

## 2019-01-22 DIAGNOSIS — R0609 Other forms of dyspnea: Secondary | ICD-10-CM | POA: Diagnosis not present

## 2019-01-22 MED ORDER — BUDESONIDE-FORMOTEROL FUMARATE 80-4.5 MCG/ACT IN AERO: 2.0000 | INHALATION_SPRAY | Freq: Two times a day (BID) | RESPIRATORY_TRACT | 0 refills | Status: DC

## 2019-01-22 NOTE — Patient Instructions (Addendum)
Plan A = Automatic = symbicort 80 Take 2 puffs first thing in am and then another 2 puffs about 12 hours later.   Work on inhaler technique:  relax and gently blow all the way out then take a nice smooth deep breath back in, triggering the inhaler at same time you start breathing in.  Hold for up to 5 seconds if you can. Blow out thru nose. Rinse and gargle with water when done  Think of using your inhalers like learning a golf swing       Plan B = Backup Only use your albuterol inhaler as a rescue medication to be used if you can't catch your breath by resting or doing a relaxed purse lip breathing pattern.  - The less you use it, the better it will work when you need it. - Ok to use the inhaler up to 2 puffs  every 4 hours if you must but call for appointment if use goes up over your usual need - Don't leave home without it !!  (think of it like the spare tire for your car)   Let Dr Lorin Mercy know you pain in your six shooter indicating possible irritation of the C6/7 nerve root     Please schedule a follow up visit in 3 months but call sooner if needed

## 2019-01-22 NOTE — Progress Notes (Signed)
Jean [name], female    DOB: 05-14-1965,    MRN: [id number]   Brief patient profile:  53 yobf  ? 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 Pulmonary Consult    Referred by Dr. Joni [name]. Pt c/o SOB x 6 months- SOB with or without any exertion. She had PFT's in Feb 2020. She is using her albuterol inhaler several times per day.  Dyspnea:  At rest x 6 months Cough: none Sleep: fine on back / one pillow  SABA use: no better with inhaler but still uses up to bid "cause they said to"  Assoc bad heartburn rec Pantoprazole (protonix) 40 mg   Take  30-60 min before first meal of the day and Pepcid (famotidine)  20 mg one after supper   X  One month trial  GERD diet  The key is to stop smoking completely before smoking completely stops you! Please remember to go to the lab and x-ray department   for your tests - we will call you with the results when they are available.    12/02/2018  f/u ov/Jean [name] re: doe ? Etiology/ stopped smoking / on nicotine patch Chief Complaint  Patient presents with  . Follow-up    Breathing is unchanged. She is using her albuterol inhaler 5 x daily on average.   Dyspnea:  Uncomfortable at rest / MMRC3 = can't walk 100 yards even at a slow pace at a flat grade s stopping due to sob   Cough: none Sleeping: breathing is fine lying down flat bed 2 pillows  SABA use: already used 4 pffs this am p stirring  02: none  Midline p x 3 years comes and goes shortest = 10 mins  And one hour most, seems worse with potato chips no pleuritic  or ex related  pepcid made it worse  rec Plan A = Automatic = symbicort 80 Take 2 puffs first thing in am and then another 2 puffs about 12 hours later.  Work on inhaler technique:  Plan B = Backup Only use your albuterol inhaler as a rescue medication Stop pepcid  Protonix 40 Take 30- 60 min before your first and last meals of the day  GERD diet     01/22/2019  f/u ov/Jean [name] re: GOLD 0 copd/ quit smoking  11/2018  Chief Complaint  Patient presents with  . Follow-up    Breathing is slightly worse since the last visit and she is wheezing some. She is using her albuterol inhaler 3-4 x per day on average.   Dyspnea:  Ok at rest  = MMRC4  = sob if tries to leave home or while getting dressed   Cough: no Sleeping: no resp c/o's  SABA use: during the day only  02: none Pain and numbness shooting into C6 distribution on R    No obvious day to day or daytime variability or assoc excess/ purulent sputum or mucus plugs or hemoptysis or cp or chest tightness,   or  overt sinus or hb symptoms.   Sleeping  without nocturnal  or early am exacerbation  of respiratory  c/o's or need for noct saba. Also denies any obvious fluctuation of symptoms with weather or environmental changes or other aggravating or alleviating factors except as outlined above   No unusual exposure hx or h/o childhood pna/ asthma or knowledge of premature birth.  Current Allergies, Complete Past Medical History, Past Surgical History, Family History, and Social History were reviewed in [employer] record.  ROS  The following are not active complaints unless bolded Hoarseness, sore throat, dysphagia, dental problems, itching, sneezing,  nasal congestion or discharge of excess mucus or purulent secretions, ear ache,   fever, chills, sweats, unintended wt loss or wt gain, classically pleuritic or exertional cp,  orthopnea pnd or arm/hand swelling  or leg swelling, presyncope, palpitations, abdominal pain,  anorexia, nausea, vomiting, diarrhea  or change in bowel habits or change in bladder habits, change in stools or change in urine, dysuria, hematuria,  rash, arthralgias, visual complaints, headache, numbness, weakness or ataxia or problems with walking or coordination,  change in mood or  memory.        Current Meds  Medication Sig  . budesonide-formoterol (SYMBICORT) 80-4.5 MCG/ACT inhaler Inhale 2 puffs into the lungs 2 (two) times daily.  . cyclobenzaprine (FLEXERIL) 10 MG tablet Take 1 tablet (10 mg total) by mouth 2 (two) times daily as needed for muscle spasms.  . divalproex (DEPAKOTE ER) 250 MG 24 hr tablet Take 3 tablets (750 mg total) by mouth daily.  Marland Kitchen gabapentin (NEURONTIN) 300 MG capsule Take 1 capsule (300 mg total) by mouth at bedtime.  . hydrochlorothiazide (HYDRODIURIL) 25 MG tablet Take 1 tablet (25 mg total) by mouth daily.  . nicotine (NICODERM CQ - DOSED IN MG/24 HR) 7 mg/24hr patch Place 7 mg onto the skin daily.  . pantoprazole (PROTONIX) 40 MG tablet Take 30- 60 min before your first and last meals of the day  . PROVENTIL HFA 108 (90 Base) MCG/ACT inhaler INHALE 2 PUFFS BY MOUTH EVERY 6 HOURS AS NEEDED FOR COUGHING, WHEEZING, OR SHORTNESS OF BREATH  . sertraline (ZOLOFT) 100 MG tablet TAKE 1 Tablet BY MOUTH ONCE DAILY  . tetrahydrozoline 0.05 % ophthalmic solution Place 1 drop into both eyes daily as needed (for dry eyes).            Past Medical History:  Diagnosis Date  . Adnexal mass 2019   likely remnant fibroid on broad ligament; getting diagnostic lap  . Anxiety   . Arthritis    hip  . Asthma   . Carpal tunnel syndrome    bilateral  . Chronic lower back pain   . Depression   . Dyspnea    with exertion  . GERD (gastroesophageal reflux disease)   . Headache    migraines  . Herpes   . Hypertension   . Liver lesion, right lobe 12/01/2015   CT chest without contrast 11/30/15 with hepatic lesions measuring 2.0 x 1.7 cm of the right lobe and 1 x 1 cm on the  dome. CT abd/pel 8/18: 1.3cm inferior R liver lobe, prob benign but recommend 3-77mo f/u with MRI w/wo IV contrast with attenuation MRI 6/19: combination of benign cavernous hemangiomas and small benign cysts  . Poor dental hygiene    chipped upper front and several loose teeth  . Substance abuse ([place])    cocaine/crack cocaine - last use 02/07/18  . SVD (spontaneous vaginal  delivery)    x 1  . Uses walker    for ambulation  . Uterine leiomyoma 02/16/2016   s/p vaginal hysterectomy 2018        Objective:     amb bf walks with rolling walker due to weak knees and recovering from L hip surgery   01/22/2019        159   12/02/18 148 lb 6.4 oz (67.3 kg)  11/24/18 148 lb 9.6 oz (67.4 kg)  11/04/18 147 lb (66.7 kg)    Vital signs reviewed - Note on arrival 02 sats  98% on RA     HEENT : pt wearing mask not removed for exam due to covid -19 concerns.    NECK :  without JVD/Nodes/TM/ nl carotid upstrokes bilaterally   LUNGS: no acc muscle use,  Nl contour chest with slt  distant bs  bilaterally without cough on insp or exp maneuvers   CV:  RRR  no s3 or murmur or increase in P2, and no edema   ABD:  soft and nontender with nl inspiratory excursion in the supine position. No bruits or organomegaly appreciated, bowel sounds nl  MS:  Nl gait/ ext warm without deformities, calf tenderness, cyanosis or clubbing No obvious joint restrictions   SKIN: warm and dry without lesions    NEURO:  alert, approp, nl sensorium with  no motor or cerebellar deficits apparent.               Assessment

## 2019-01-23 ENCOUNTER — Encounter: Payer: Self-pay | Admitting: Internal Medicine

## 2019-01-23 NOTE — Assessment & Plan Note (Signed)
Onset 2016  - PFT's  10/29/2018  FEV1 2.08 (93 % ) ratio 0.84  p 0 % improvement from saba p ? prior to study with DLCO  78 % corrects to 93 % for alv volume and 20% improvement in FVC p saba with incomplete exhalation Echo 11/11/18   1. The left ventricle has hyperdynamic systolic function, with an ejection fraction of >65%. The cavity size was normal. There is moderately increased left ventricular wall thickness. Left ventricular diastolic Doppler parameters are consistent with  impaired relaxation.  2. The right ventricle has normal systolic function. The cavity was normal. There is no increase in right ventricular wall thickness.  3. The aortic valve is tricuspid. Moderate calcification of the aortic valve with nodular calcification on the right coronary cusp. No stenosis of the aortic valve.  4. The aortic root and ascending aorta are normal in size and structure.  5. Normal IVC size. No complete TR doppler jet so unable to estimate PA systolic pressure.  6. No evidence of mitral valve stenosis. No significant mitral regurgitation.  Continues to have sob not explained by objective findings s obvious cardiac or pulmonary source, suggesting related to anxiety/ conditioning issues at this point.

## 2019-01-23 NOTE — Assessment & Plan Note (Signed)
Quit smoking 11/2018 - PFT's  10/29/2018  FEV1 2.08 (93 % ) ratio 0.84  p 0 % improvement from saba p ? prior to study with DLCO  78 % corrects to 93 % for alv volume and 20% improvement in FVC p saba with incomplete exhalation - 12/02/2018   try symbicort 80 2bid  - 01/22/2019  After extensive coaching inhaler device,  effectiveness =    75% from a baseline of 50%   Surprised she's not feeling better or less dep on saba at this point if in fact she has stopped smoking as she states  No change in rx needed, just needs to work harder on optimal hfa technique.   I had an extended discussion with the patient reviewing all relevant studies completed to date and  lasting 15 to 20 minutes of a 25 minute visit    I performed detailed device teaching using a teach back method which extended face to face time for this visit (see above)  Each maintenance medication was reviewed in detail including emphasizing most importantly the difference between maintenance and prns and under what circumstances the prns are to be triggered using an action plan format that is not reflected in the computer generated alphabetically organized AVS which I have not found useful in most complex patients, especially with respiratory illnesses  Please see AVS for specific instructions unique to this visit that I personally wrote and verbalized to the the pt in detail and then reviewed with pt  by my nurse highlighting any  changes in therapy recommended at today's visit to their plan of care.

## 2019-01-29 ENCOUNTER — Ambulatory Visit: Payer: Medicaid Other | Admitting: Surgery

## 2019-01-29 ENCOUNTER — Other Ambulatory Visit: Payer: Self-pay

## 2019-01-29 DIAGNOSIS — R0609 Other forms of dyspnea: Secondary | ICD-10-CM

## 2019-01-29 DIAGNOSIS — F4321 Adjustment disorder with depressed mood: Secondary | ICD-10-CM

## 2019-01-29 DIAGNOSIS — I10 Essential (primary) hypertension: Secondary | ICD-10-CM

## 2019-01-29 NOTE — Telephone Encounter (Signed)
Requesting all [facility]s [id number] [address], [place] [phone] (Phone) [phone] (Fax)   Also requesting to speak with a nurse about bp. Please call pt back.

## 2019-01-30 MED ORDER — AMLODIPINE BESYLATE 10 MG PO TABS: 10.0000 mg | ORAL_TABLET | Freq: Every day | ORAL | 3 refills | Status: DC

## 2019-01-30 MED ORDER — SERTRALINE HCL 100 MG PO TABS: 100.0000 mg | ORAL_TABLET | Freq: Every day | ORAL | 2 refills | Status: DC

## 2019-01-30 MED ORDER — ALBUTEROL SULFATE HFA 108 (90 BASE) MCG/ACT IN AERS: INHALATION_SPRAY | RESPIRATORY_TRACT | 2 refills | Status: DC

## 2019-01-30 MED ORDER — GABAPENTIN 300 MG PO CAPS: 300.0000 mg | ORAL_CAPSULE | Freq: Every day | ORAL | 1 refills | Status: DC

## 2019-01-30 MED ORDER — HYDROCHLOROTHIAZIDE 25 MG PO TABS: 25.0000 mg | ORAL_TABLET | Freq: Every day | ORAL | 3 refills | Status: DC

## 2019-01-30 NOTE — Telephone Encounter (Signed)
Patient returned call states she was seen at oral surgeon's office and was told her BP was high. She checked it yesterday and it was 155/111. Patient is asymptomatic. She has an appt with new PCP on 02/04/2019. She now has medicaid and can no longer use [facility]. [facility] [address] She is aware to call neurolgy office for refill on Depakote as they are the presciber for that. She has quit smoking and no longer needs the Nicoderm patches.

## 2019-01-30 NOTE — Telephone Encounter (Signed)
Returned call to patient. No answer. Left message on VM requesting return call. L. Yaqub Arney, RN, BSN     

## 2019-02-04 ENCOUNTER — Ambulatory Visit: Payer: Medicaid Other | Admitting: Surgery

## 2019-02-04 ENCOUNTER — Ambulatory Visit (INDEPENDENT_AMBULATORY_CARE_PROVIDER_SITE_OTHER): Payer: Medicaid Other | Admitting: Internal Medicine

## 2019-02-04 ENCOUNTER — Other Ambulatory Visit: Payer: Self-pay | Admitting: Internal Medicine

## 2019-02-04 ENCOUNTER — Other Ambulatory Visit: Payer: Self-pay

## 2019-02-04 ENCOUNTER — Encounter: Payer: Self-pay | Admitting: Internal Medicine

## 2019-02-04 VITALS — BP 145/91 | HR 67 | Temp 99.1°F | Ht 64.0 in | Wt 155.5 lb

## 2019-02-04 DIAGNOSIS — Z98818 Other dental procedure status: Secondary | ICD-10-CM | POA: Diagnosis not present

## 2019-02-04 DIAGNOSIS — Z79891 Long term (current) use of opiate analgesic: Secondary | ICD-10-CM | POA: Diagnosis not present

## 2019-02-04 DIAGNOSIS — G894 Chronic pain syndrome: Secondary | ICD-10-CM | POA: Diagnosis not present

## 2019-02-04 DIAGNOSIS — I10 Essential (primary) hypertension: Secondary | ICD-10-CM

## 2019-02-04 DIAGNOSIS — F329 Major depressive disorder, single episode, unspecified: Secondary | ICD-10-CM | POA: Diagnosis not present

## 2019-02-04 DIAGNOSIS — J449 Chronic obstructive pulmonary disease, unspecified: Secondary | ICD-10-CM | POA: Diagnosis not present

## 2019-02-04 DIAGNOSIS — Z23 Encounter for immunization: Secondary | ICD-10-CM | POA: Diagnosis present

## 2019-02-04 DIAGNOSIS — Z9289 Personal history of other medical treatment: Secondary | ICD-10-CM

## 2019-02-04 DIAGNOSIS — Z1211 Encounter for screening for malignant neoplasm of colon: Secondary | ICD-10-CM

## 2019-02-04 DIAGNOSIS — Z79899 Other long term (current) drug therapy: Secondary | ICD-10-CM

## 2019-02-04 DIAGNOSIS — K0889 Other specified disorders of teeth and supporting structures: Secondary | ICD-10-CM | POA: Diagnosis not present

## 2019-02-04 DIAGNOSIS — F320 Major depressive disorder, single episode, mild: Secondary | ICD-10-CM

## 2019-02-04 MED ORDER — CYCLOBENZAPRINE HCL 10 MG PO TABS: 10.0000 mg | ORAL_TABLET | Freq: Two times a day (BID) | ORAL | 3 refills | Status: DC | PRN

## 2019-02-04 MED ORDER — HYDROCODONE-ACETAMINOPHEN 5-325 MG PO TABS: 1.0000 | ORAL_TABLET | Freq: Four times a day (QID) | ORAL | 0 refills | Status: AC | PRN

## 2019-02-04 NOTE — Patient Instructions (Signed)
It was nice seeing you today! Thank you for choosing [facility] for your Primary Care.    Today we talked about:   1. Dental Surgery: We prescribed 3 more days of Norco. Afterwards, if you continue to have pain, try Tylenol for 2-3 days.  2. Colonoscopy - You will be called to set up appointment.

## 2019-02-04 NOTE — Progress Notes (Signed)
CC: Hypertension follow up  HPI:  [name] is a 53 y.o. with a PMHx of HTN, COPD, chronic pain syndrome who presents to the clinic for hypertension follow up.  [name] notes that she recently had all of her teeth pulled in preparation to have dentures fitted, approximately 1.5 weeks ago. Since that time, she continues to have pain, especially on the left side that she sleeps on. She denies any problems with her sutures. Denies pus or bloody drainage. Her dentist refilled Norco twice, a total of 17 pills, which controlled her pain but she has run out.   Please see the Encounters tab for problem-based Assessment & Plan regarding status of patient's chronic conditions.  Past Medical History:  Diagnosis Date  . Adnexal mass 2019   likely remnant fibroid on broad ligament; getting diagnostic lap  . Anxiety   . Arthritis    hip  . Asthma   . Carpal tunnel syndrome    bilateral  . Chronic lower back pain   . Depression   . Dyspnea    with exertion  . GERD (gastroesophageal reflux disease)   . Headache    migraines  . Herpes   . Hypertension   . Liver lesion, right lobe 12/01/2015   CT chest without contrast 11/30/15 with hepatic lesions measuring 2.0 x 1.7 cm of the right lobe and 1 x 1 cm on the dome. CT abd/pel 8/18: 1.3cm inferior R liver lobe, prob benign but recommend 3-4mo f/u with MRI w/wo IV contrast with attenuation MRI 6/19: combination of benign cavernous hemangiomas and small benign cysts  . Poor dental hygiene    chipped upper front and several loose teeth  . Substance abuse ([place])    cocaine/crack cocaine - last use 02/07/18  . SVD (spontaneous vaginal delivery)    x 1  . Uses walker    for ambulation  . Uterine leiomyoma 02/16/2016   s/p vaginal hysterectomy 2018   Review of Systems: Review of Systems  Constitutional: Negative for chills and fever.  Respiratory: Negative for cough and shortness of breath.   Cardiovascular: Negative for chest pain,  palpitations and leg swelling.  Gastrointestinal: Negative for abdominal pain, diarrhea, nausea and vomiting.  Musculoskeletal: Positive for myalgias (chronic).  Skin:       Mouth pain  Neurological: Negative for dizziness.   Physical Exam:  Vitals:   02/04/19 1323  BP: (!) 145/91  Pulse: 67  Temp: 99.1 F (37.3 C)  TempSrc: Oral  SpO2: 99%  Weight: 155 lb 8 oz (70.5 kg)  Height: 5\' 4"  (1.626 m)   Physical Exam Vitals signs and nursing note reviewed.  Constitutional:      Appearance: She is normal weight.  HENT:     Mouth/Throat:     Mouth: Mucous membranes are moist.     Dentition: Abnormal dentition (No teeth present). Gum lesions (sutures in place without any erythema or drainage visible) present.     Pharynx: Oropharynx is clear. No pharyngeal swelling or posterior oropharyngeal erythema.  Pulmonary:     Effort: Pulmonary effort is normal. No respiratory distress.  Skin:    General: Skin is warm and dry.  Neurological:     General: No focal deficit present.     Mental Status: She is alert and oriented to person, place, and time. Mental status is at baseline.  Psychiatric:        Mood and Affect: Mood normal.        Behavior:  Behavior normal.    Assessment & Plan:   See Encounters Tab for problem based charting.  Patient seen with Dr. Evette Doffing

## 2019-02-04 NOTE — Telephone Encounter (Signed)
Refill Request  cyclobenzaprine (FLEXERIL) 10 MG tablet  [id number] [place], [place] - [place]

## 2019-02-05 DIAGNOSIS — Z9289 Personal history of other medical treatment: Secondary | ICD-10-CM | POA: Insufficient documentation

## 2019-02-05 NOTE — Assessment & Plan Note (Signed)
Jean [name] feels her mood has acutely worsened since she had all of her teeth pulled. She was expecting to have to wait 1.5 months before dentures could even be fitted and longer till they are ready. She feels uncomfortable going out. However, prior to surgery, she recalls feeling well and that her Sertraline was controlling her MDD well. She denies SI.   Current episode likely acute adjustment to recent procedure and its sequelae. Will monitor to ensure it resolves appropriately.   Plan:  - Continue Sertraline

## 2019-02-05 NOTE — Assessment & Plan Note (Signed)
Blood pressure today is slightly elevated but likely secondary to pain from recent procedure and stress associated with that. Continue with current medication regimen.   Plan:  - Continue HCTZ, Amlodipine

## 2019-02-05 NOTE — Assessment & Plan Note (Signed)
Recent dental work that involved removal of all remaining teeth for future denture placement. Patient continues to have pain that interferes with her quality of life. Will refill short course of Norco and then recommended she switch to OTC pain control afterwards.   PDMP reviewed and is as reported by patient. No concerning findings.   Plan:  - Hydrocodone-Acetaminophen 5-325mg  q6h PRN for pain, #12 tablets

## 2019-02-08 NOTE — Progress Notes (Signed)
Internal Medicine Clinic Attending  I saw and evaluated the patient.  I personally confirmed the key portions of the history and exam documented by Dr. Basaraba and I reviewed pertinent patient test results.  The assessment, diagnosis, and plan were formulated together and I agree with the documentation in the resident's note.    

## 2019-02-12 ENCOUNTER — Other Ambulatory Visit: Payer: Self-pay

## 2019-02-12 ENCOUNTER — Encounter: Payer: Self-pay | Admitting: Surgery

## 2019-02-12 ENCOUNTER — Ambulatory Visit (INDEPENDENT_AMBULATORY_CARE_PROVIDER_SITE_OTHER): Payer: Medicaid Other | Admitting: Surgery

## 2019-02-12 VITALS — BP 149/84 | HR 66 | Ht 64.0 in | Wt 157.0 lb

## 2019-02-12 DIAGNOSIS — M5030 Other cervical disc degeneration, unspecified cervical region: Secondary | ICD-10-CM | POA: Diagnosis not present

## 2019-02-12 DIAGNOSIS — M4722 Other spondylosis with radiculopathy, cervical region: Secondary | ICD-10-CM

## 2019-02-12 NOTE — Progress Notes (Signed)
Office Visit Note   Patient: Jean [name]           Date of Birth: [birth year]           MRN: [id number] Visit Date: 02/12/2019              Requested by: Jose Persia, MD [address] [place],  [place] [address] PCP: Jose Persia, MD   Assessment & Plan: Visit Diagnoses:  1. Other spondylosis with radiculopathy, cervical region   2. Other cervical disc degeneration, unspecified cervical region     Plan: With patient's ongoing symptoms that have failed conservative treatment I recommend getting cervical spine MRI to compare to previous study that was done December 2018.  Follow-up with Dr. Lorin Mercy after completion of the study to discuss results and further treatment options.  Follow-Up Instructions: Return in about 3 weeks (around 03/05/2019) for With Dr. Lorin Mercy to review cervical MRI .   Orders:  Orders Placed This Encounter  Procedures  . MR Cervical Spine w/o contrast   No orders of the defined types were placed in this encounter.     Procedures: No procedures performed   Clinical Data: No additional findings.   Subjective: Chief Complaint  Patient presents with  . Right Shoulder - Pain  . Neck - Pain    HPI 53 year old white female returns with complaints of worsening neck pain and right upper extremity radiculopathy.  She was seen by Dr. Lorin Mercy 2 months ago for the same problem.  She has not had any improvement in her symptoms.  Did have cervical spine MRI December 2018 and has not had an updated study.  States that right-sided neck pain radiates to her right upper and lower arm and is constant.  No symptoms on the left side.  Feels like her right arm is getting heavy. Review of Systems No current cardiopulmonary GI GU issues  Objective: Vital Signs: BP (!) 149/84   Pulse 66   Ht 5\' 4"  (1.626 m)   Wt 157 lb (71.2 kg)   LMP  (LMP Unknown)   BMI 26.95 kg/m   Physical Exam Constitutional:      Comments:  patient appears to have  somewhat of a slurred speech.  She does have a mask on during our visit but I do not think the slurred speech is the contributing factor.  HENT:     Head: Normocephalic and atraumatic.  Eyes:     Extraocular Movements: Extraocular movements intact.     Pupils: Pupils are equal, round, and reactive to light.  Pulmonary:     Effort: No respiratory distress.  Musculoskeletal:     Comments: Positive right greater left brachial plexus tenderness.  Positive right trapezius and right scapular tenderness.  Good bilateral shoulder range of motion.  Neurological:     General: No focal deficit present.     Mental Status: She is oriented to person, place, and time.     Ortho Exam  Specialty Comments:  No specialty comments available.  Imaging: No results found.   PMFS History: Patient Active Problem List   Diagnosis Date Noted  . History of dental surgery 02/05/2019  . COPD GOLD 0 ? AB  12/06/2018  . 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 GERD (gastroesophageal reflux disease)   . Breast pain 06/23/2018  . Dyspareunia in female 09/13/2017  . Cervicalgia 04/24/2017  . CKD (chronic kidney disease) 10/29/2016  . Chronic pain syndrome 05/10/2016  .  MDD (major depressive disorder) 10/19/2015  . Dyspnea on exertion 08/22/2015  . HTN (hypertension) 07/08/2015  . Routine health maintenance 07/08/2015  . Tobacco abuse 07/08/2015   Past Medical History:  Diagnosis Date  . Adnexal mass 2019   likely remnant fibroid on broad ligament; getting diagnostic lap  . Anxiety   . Arthritis    hip  . Asthma   . Carpal tunnel syndrome    bilateral  . Chest wall pain 10/15/2016  . Chronic lower back pain   . Depression   . Dyspnea    with exertion  . GERD (gastroesophageal reflux disease)   . Headache    migraines  . Herpes   . Hypertension   . Liver lesion, right lobe 12/01/2015   CT chest without contrast 11/30/15 with hepatic lesions measuring 2.0 x 1.7 cm of  the right lobe and 1 x 1 cm on the dome. CT abd/pel 8/18: 1.3cm inferior R liver lobe, prob benign but recommend 3-27mo f/u with MRI w/wo IV contrast with attenuation MRI 6/19: combination of benign cavernous hemangiomas and small benign cysts  . Poor dental hygiene    chipped upper front and several loose teeth  . Substance abuse ([place])    cocaine/crack cocaine - last use 02/07/18  . SVD (spontaneous vaginal delivery)    x 1  . Uses walker    for ambulation  . Uterine leiomyoma 02/16/2016   s/p vaginal hysterectomy 2018  . Vaginal discharge 03/26/2018    Family History  Problem Relation Age of Onset  . Cancer Mother   . Hypertension Mother   . Diabetes Mother   . Breast cancer Mother        diagnosed in her 65's  . Stroke Father     Past Surgical History:  Procedure Laterality Date  . LAPAROSCOPIC OVARIAN CYSTECTOMY Right 02/25/2018   Procedure: LAPAROSCOPIC REMOVAL OF RIGHT ADNEXAL MASS;  Surgeon: Chancy Milroy, MD;  Location: [facility];  Service: Gynecology;  Laterality: Right;  . SALPINGECTOMY Left    lap - ectopic preg  . TOTAL HIP ARTHROPLASTY Left 01/20/2016   Procedure: LEFT TOTAL HIP ARTHROPLASTY ANTERIOR APPROACH;  Surgeon: Mcarthur Rossetti, MD;  Location: [facility];  Service: Orthopedics;  Laterality: Left;  . TUBAL LIGATION    . VAGINAL HYSTERECTOMY Right 12/11/2016   Procedure: HYSTERECTOMY VAGINAL WITH RIGHT SALPINGECTOMY;  Surgeon: Chancy Milroy, MD;  Location: [facility];  Service: Gynecology;  Laterality: Right;   Social History   Occupational History  . Not on file  Tobacco Use  . Smoking status: Former Smoker    Packs/day: 0.50    Years: 29.00    Pack years: 14.50    Types: Cigarettes    Quit date: 11/25/2018    Years since quitting: 0.2  . Smokeless tobacco: Never Used  Substance and Sexual Activity  . Alcohol use: Yes    Alcohol/week: 3.0 standard drinks    Types: 3 Cans of beer per week    Comment: Occasional Beer   . Drug use: No    Types:  "Crack" cocaine, Cocaine    Comment: Last use 2 wks ago 02/07/18  . Sexual activity: Yes    Partners: Male    Birth control/protection: None, Post-menopausal    Comment: Hysterectomy

## 2019-03-03 ENCOUNTER — Encounter: Payer: Self-pay | Admitting: Internal Medicine

## 2019-03-13 ENCOUNTER — Other Ambulatory Visit: Payer: Medicaid Other

## 2019-03-17 ENCOUNTER — Emergency Department (HOSPITAL_COMMUNITY)
Admission: EM | Admit: 2019-03-17 | Discharge: 2019-03-17 | Disposition: A | Discharge status: Home / Self Care | Payer: Medicaid Other | Attending: Emergency Medicine | Admitting: Emergency Medicine

## 2019-03-17 ENCOUNTER — Other Ambulatory Visit: Payer: Self-pay

## 2019-03-17 ENCOUNTER — Encounter (HOSPITAL_COMMUNITY): Payer: Self-pay | Admitting: Emergency Medicine

## 2019-03-17 DIAGNOSIS — Z5321 Procedure and treatment not carried out due to patient leaving prior to being seen by health care provider: Secondary | ICD-10-CM | POA: Diagnosis not present

## 2019-03-17 DIAGNOSIS — R0789 Other chest pain: Secondary | ICD-10-CM | POA: Diagnosis present

## 2019-03-17 NOTE — ED Triage Notes (Signed)
Pt in with L posterior rib pain, worse x 3 days. States hx of bone spur b/t 2nd and 3rd rib. More pain with deep breaths, sats 98% on RA. Denies any sob or resp symptoms, e/u breathing.

## 2019-03-18 ENCOUNTER — Encounter: Payer: Self-pay | Admitting: Internal Medicine

## 2019-03-18 ENCOUNTER — Ambulatory Visit (INDEPENDENT_AMBULATORY_CARE_PROVIDER_SITE_OTHER): Payer: Medicaid Other | Admitting: Orthopaedic Surgery

## 2019-03-18 ENCOUNTER — Ambulatory Visit: Payer: Medicaid Other

## 2019-03-18 ENCOUNTER — Encounter: Payer: Self-pay | Admitting: Orthopaedic Surgery

## 2019-03-18 ENCOUNTER — Other Ambulatory Visit: Payer: Self-pay

## 2019-03-18 VITALS — BP 164/96 | HR 63 | Ht 63.0 in | Wt 150.0 lb

## 2019-03-18 DIAGNOSIS — M542 Cervicalgia: Secondary | ICD-10-CM

## 2019-03-18 DIAGNOSIS — F320 Major depressive disorder, single episode, mild: Secondary | ICD-10-CM | POA: Diagnosis not present

## 2019-03-18 NOTE — Progress Notes (Signed)
Office Visit Note   Patient: Jean [name]           Date of Birth: Jul 16, 1965           MRN: [id number] Visit Date: 03/18/2019              Requested by: Jose Persia, MD [address] [place],  [place] [address] PCP: Jose Persia, MD   Assessment & Plan: Visit Diagnoses:  1. Cervicalgia   2. Current mild episode of major depressive disorder, unspecified whether recurrent ([place])     Plan: Patient is MRI scan scheduled.  We will recheck her again after scan is obtained for review.  No change in treatment at this time.  Follow-Up Instructions: Return in about 2 weeks (around 04/01/2019).   Orders:  No orders of the defined types were placed in this encounter.  No orders of the defined types were placed in this encounter.     Procedures: No procedures performed   Clinical Data: No additional findings.   Subjective: Chief Complaint  Patient presents with  . Neck - Pain  . Right Shoulder - Pain    HPI 53 year old female returns with ongoing problems with neck pain.  She has chronic pain, depression, financial insecurity she stayed with friends in the past.  Upper extremity EMGs nerve conduction velocities are entirely normal.  MRI scan cervical spine was originally ordered denied and now has been approved.  She continues to have pain in her neck radiates into her right shoulder.  States she is also had some pain around her ribs without rash.  Patient somewhat poor historian.  Patient's been through physical therapy in the past.  She continues to complain of back pain thoracic pain neck pain.  Review of Systems 14 point system update unchanged from 07/18/2016.   Objective: Vital Signs: BP (!) 164/96   Pulse 63   Ht 5\' 3"  (1.6 m)   Wt 150 lb (68 kg)   LMP  (LMP Unknown)   BMI 26.57 kg/m   Physical Exam Constitutional:      Appearance: She is well-developed.  HENT:     Head: Normocephalic.     Right Ear: External ear normal.     Left Ear:  External ear normal.  Eyes:     Pupils: Pupils are equal, round, and reactive to light.  Neck:     Thyroid: No thyromegaly.     Trachea: No tracheal deviation.  Cardiovascular:     Rate and Rhythm: Normal rate.  Pulmonary:     Effort: Pulmonary effort is normal.  Abdominal:     Palpations: Abdomen is soft.  Skin:    General: Skin is warm and dry.  Neurological:     Mental Status: She is alert and oriented to person, place, and time.  Psychiatric:        Behavior: Behavior normal.     Ortho Exam patient has intact upper extremity reflexes complains of pain with palpation over the trapezium as well as brachial plexus reflexes are 3+ and symmetrical no lower extremity clonus.  She complains of palpation of the thoracic spine lumbar spine.  Knee and ankle jerk are intact.  Specialty Comments:  No specialty comments available.  Imaging: No results found.   PMFS History: Patient Active Problem List   Diagnosis Date Noted  . History of dental surgery 02/05/2019  . COPD GOLD 0 ? AB  12/06/2018  . Chronic hip pain after total replacement of hip  joint 11/24/2018  . GERD (gastroesophageal reflux disease)   . Breast pain 06/23/2018  . Dyspareunia in female 09/13/2017  . Cervicalgia 04/24/2017  . CKD (chronic kidney disease) 10/29/2016  . Chronic pain syndrome 05/10/2016  . MDD (major depressive disorder) 10/19/2015  . Dyspnea on exertion 08/22/2015  . HTN (hypertension) 07/08/2015  . Routine health maintenance 07/08/2015  . Tobacco abuse 07/08/2015   Past Medical History:  Diagnosis Date  . Adnexal mass 2019   likely remnant fibroid on broad ligament; getting diagnostic lap  . Anxiety   . Arthritis    hip  . Asthma   . Carpal tunnel syndrome    bilateral  . Chest wall pain 10/15/2016  . Chronic lower back pain   . Depression   . Dyspnea    with exertion  . GERD (gastroesophageal reflux disease)   . Headache    migraines  . Herpes   . Hypertension   . Liver  lesion, right lobe 12/01/2015   CT chest without contrast 11/30/15 with hepatic lesions measuring 2.0 x 1.7 cm of the right lobe and 1 x 1 cm on the dome. CT abd/pel 8/18: 1.3cm inferior R liver lobe, prob benign but recommend 3-94mo f/u with MRI w/wo IV contrast with attenuation MRI 6/19: combination of benign cavernous hemangiomas and small benign cysts  . Poor dental hygiene    chipped upper front and several loose teeth  . Substance abuse ([place])    cocaine/crack cocaine - last use 02/07/18  . SVD (spontaneous vaginal delivery)    x 1  . Uses walker    for ambulation  . Uterine leiomyoma 02/16/2016   s/p vaginal hysterectomy 2018  . Vaginal discharge 03/26/2018    Family History  Problem Relation Age of Onset  . Cancer Mother   . Hypertension Mother   . Diabetes Mother   . Breast cancer Mother        diagnosed in her 80's  . Stroke Father     Past Surgical History:  Procedure Laterality Date  . LAPAROSCOPIC OVARIAN CYSTECTOMY Right 02/25/2018   Procedure: LAPAROSCOPIC REMOVAL OF RIGHT ADNEXAL MASS;  Surgeon: Chancy Milroy, MD;  Location: [facility];  Service: Gynecology;  Laterality: Right;  . SALPINGECTOMY Left    lap - ectopic preg  . TOTAL HIP ARTHROPLASTY Left 01/20/2016   Procedure: LEFT TOTAL HIP ARTHROPLASTY ANTERIOR APPROACH;  Surgeon: Mcarthur Rossetti, MD;  Location: [facility];  Service: Orthopedics;  Laterality: Left;  . TUBAL LIGATION    . VAGINAL HYSTERECTOMY Right 12/11/2016   Procedure: HYSTERECTOMY VAGINAL WITH RIGHT SALPINGECTOMY;  Surgeon: Chancy Milroy, MD;  Location: [facility];  Service: Gynecology;  Laterality: Right;   Social History   Occupational History  . Not on file  Tobacco Use  . Smoking status: Former Smoker    Packs/day: 0.50    Years: 29.00    Pack years: 14.50    Types: Cigarettes    Quit date: 11/25/2018    Years since quitting: 0.3  . Smokeless tobacco: Never Used  Substance and Sexual Activity  . Alcohol use: Yes    Alcohol/week: 3.0  standard drinks    Types: 3 Cans of beer per week    Comment: Occasional Beer   . Drug use: No    Types: "Crack" cocaine, Cocaine    Comment: Last use 2 wks ago 02/07/18  . Sexual activity: Yes    Partners: Male    Birth control/protection: None, Post-menopausal  Comment: Hysterectomy

## 2019-03-23 ENCOUNTER — Other Ambulatory Visit: Payer: Medicaid Other

## 2019-04-07 ENCOUNTER — Ambulatory Visit: Payer: Medicaid Other | Admitting: Orthopaedic Surgery

## 2019-04-08 ENCOUNTER — Other Ambulatory Visit: Payer: Self-pay | Admitting: Internal Medicine

## 2019-04-08 DIAGNOSIS — I10 Essential (primary) hypertension: Secondary | ICD-10-CM

## 2019-04-08 DIAGNOSIS — R0609 Other forms of dyspnea: Secondary | ICD-10-CM

## 2019-04-08 DIAGNOSIS — F4321 Adjustment disorder with depressed mood: Secondary | ICD-10-CM

## 2019-04-08 MED ORDER — SERTRALINE HCL 100 MG PO TABS: 100.0000 mg | ORAL_TABLET | Freq: Every day | ORAL | 1 refills | Status: DC

## 2019-04-08 MED ORDER — GABAPENTIN 300 MG PO CAPS: 300.0000 mg | ORAL_CAPSULE | Freq: Every day | ORAL | 1 refills | Status: DC

## 2019-04-08 MED ORDER — PANTOPRAZOLE SODIUM 40 MG PO TBEC: DELAYED_RELEASE_TABLET | ORAL | 1 refills | Status: DC

## 2019-04-08 MED ORDER — AMLODIPINE BESYLATE 10 MG PO TABS: 10.0000 mg | ORAL_TABLET | Freq: Every day | ORAL | 1 refills | Status: DC

## 2019-04-08 MED ORDER — ALBUTEROL SULFATE HFA 108 (90 BASE) MCG/ACT IN AERS: INHALATION_SPRAY | RESPIRATORY_TRACT | 1 refills | Status: DC

## 2019-04-08 MED ORDER — BUDESONIDE-FORMOTEROL FUMARATE 80-4.5 MCG/ACT IN AERO: 2.0000 | INHALATION_SPRAY | Freq: Two times a day (BID) | RESPIRATORY_TRACT | 1 refills | Status: DC

## 2019-04-08 NOTE — Telephone Encounter (Signed)
Refill Request  albuterol (PROVENTIL HFA) 108 (90 Base) MCG/ACT inhaler  amLODipine (NORVASC) 10 MG tablet(Expired)  budesonide-formoterol (SYMBICORT) 80-4.5 MCG/ACT inhaler  gabapentin (NEURONTIN) 300 MG capsule  hydrochlorothiazide (HYDRODIURIL) 25 MG tablet  pantoprazole (pantoprazole (PROTONIX) 40 MG tabletPROTONIX) 40 MG tablet  sertraline (ZOLOFT) 100 MG tablet  [id number] [place] [place] [facility] [place]

## 2019-04-15 ENCOUNTER — Other Ambulatory Visit: Payer: Self-pay

## 2019-04-15 ENCOUNTER — Ambulatory Visit
Admission: RE | Admit: 2019-04-15 | Discharge: 2019-04-15 | Disposition: A | Discharge status: Home / Self Care | Payer: Medicaid Other | Source: Ambulatory Visit | Attending: Surgery | Admitting: Surgery

## 2019-04-15 DIAGNOSIS — M5030 Other cervical disc degeneration, unspecified cervical region: Secondary | ICD-10-CM

## 2019-04-20 ENCOUNTER — Telehealth: Payer: Self-pay | Admitting: Orthopaedic Surgery

## 2019-04-20 MED ORDER — ACETAMINOPHEN-CODEINE #3 300-30 MG PO TABS: 1.0000 | ORAL_TABLET | Freq: Two times a day (BID) | ORAL | 0 refills | Status: DC | PRN

## 2019-04-20 NOTE — Telephone Encounter (Signed)
I called rx to pharmacy. I left voicemail for patient advising.

## 2019-04-20 NOTE — Telephone Encounter (Signed)
OK tylenol # 3      twenty tabs  one po bid prn pain thanks

## 2019-04-20 NOTE — Telephone Encounter (Signed)
Please advise 

## 2019-04-20 NOTE — Telephone Encounter (Signed)
Patient called stating that she is in pain and wanted to know if Dr. Lorin Mercy would call her a RX for pain medication.  [facility] [address]  [phone].  Thank you.

## 2019-04-22 ENCOUNTER — Encounter: Payer: Self-pay | Admitting: Orthopaedic Surgery

## 2019-04-22 ENCOUNTER — Other Ambulatory Visit: Payer: Self-pay

## 2019-04-22 ENCOUNTER — Ambulatory Visit (INDEPENDENT_AMBULATORY_CARE_PROVIDER_SITE_OTHER): Payer: Medicaid Other | Admitting: Orthopaedic Surgery

## 2019-04-22 VITALS — BP 161/99 | HR 75 | Ht 64.0 in | Wt 152.0 lb

## 2019-04-22 DIAGNOSIS — M7541 Impingement syndrome of right shoulder: Secondary | ICD-10-CM | POA: Diagnosis not present

## 2019-04-22 DIAGNOSIS — M542 Cervicalgia: Secondary | ICD-10-CM

## 2019-04-22 MED ORDER — LIDOCAINE HCL 1 % IJ SOLN
0.5000 mL | INTRAMUSCULAR | Status: AC | PRN
  Administered 2019-04-22: 15:00:00 .5 mL

## 2019-04-22 MED ORDER — METHYLPREDNISOLONE ACETATE 40 MG/ML IJ SUSP
40.0000 mg | INTRAMUSCULAR | Status: AC | PRN
  Administered 2019-04-22: 15:00:00 40 mg via INTRA_ARTICULAR

## 2019-04-22 MED ORDER — BUPIVACAINE HCL 0.25 % IJ SOLN
4.0000 mL | INTRAMUSCULAR | Status: AC | PRN
  Administered 2019-04-22: 15:00:00 4 mL via INTRA_ARTICULAR

## 2019-04-22 NOTE — Progress Notes (Signed)
Office Visit Note   Patient: Jean [name]           Date of Birth: 10-08-1965           MRN: [id number] Visit Date: 04/22/2019              Requested by: Jose Persia, MD [address],  [place] [address] PCP: Jose Persia, MD   Assessment & Plan: Visit Diagnoses:  1. Cervicalgia   2. Impingement syndrome of right shoulder     Plan: Subacromial injection performed right shoulder.  We reviewed her cervical MRI scan no indication for operative intervention.  Follow-up as needed.  Follow-Up Instructions: No follow-ups on file.   Orders:  Orders Placed This Encounter  Procedures  . Large Joint Inj   No orders of the defined types were placed in this encounter.     Procedures: Large Joint Inj: R subacromial bursa on 04/22/2019 3:04 PM Indications: pain Details: 22 G 1.5 in needle  Arthrogram: No  Medications: 4 mL bupivacaine 0.25 %; 40 mg methylPREDNISolone acetate 40 MG/ML; 0.5 mL lidocaine 1 % Outcome: tolerated well, no immediate complications Procedure, treatment alternatives, risks and benefits explained, specific risks discussed. Consent was given by the patient. Immediately prior to procedure a time out was called to verify the correct patient, procedure, equipment, support staff and site/side marked as required. Patient was prepped and draped in the usual sterile fashion.       Clinical Data: No additional findings.   Subjective: Chief Complaint  Patient presents with  . Neck - Pain, Follow-up    MRI Review Cervical Spine  . Right Shoulder - Pain    HPI 53 year old female returns post MRI scan cervical spine with continued pain in her neck some pain in her right shoulder.  She states she can hardly walk after total hip arthroplasty.  She has depression chronic pain past cocaine/crack use, last 10/19.  Patient disabled positive for severe depression.  Previous hysterectomy.  Total breast plasty on the left.  Patient is in a  wheelchair today.  She also has a walker.  Review of Systems 14 point system negative other than as mentioned in HPI systems update from 02/04/2019 note.  Objective: Vital Signs: BP (!) 161/99   Pulse 75   Ht 5\' 4"  (1.626 m)   Wt 152 lb (68.9 kg)   LMP  (LMP Unknown)   BMI 26.09 kg/m   Physical Exam Constitutional:      Appearance: She is well-developed.  HENT:     Head: Normocephalic.     Right Ear: External ear normal.     Left Ear: External ear normal.  Eyes:     Pupils: Pupils are equal, round, and reactive to light.  Neck:     Thyroid: No thyromegaly.     Trachea: No tracheal deviation.  Cardiovascular:     Rate and Rhythm: Normal rate.  Pulmonary:     Effort: Pulmonary effort is normal.  Abdominal:     Palpations: Abdomen is soft.  Skin:    General: Skin is warm and dry.  Neurological:     Mental Status: She is alert and oriented to person, place, and time.  Psychiatric:        Behavior: Behavior normal.     Ortho Exam patient has a myelopathic type gait narrow stance and walks with out reaching full knee extension on the left leg.  No pain with hip range of motion no  swelling of the knee Quad state good strength.  When she walks she has abnormal shaking closer to her body quiver, nonphysiologic.  Lower extremity reflexes are 2+.  Upper extremity reflexes are 2+.  Positive impingement right shoulder.  Acromioclavicular joint is mildly tender.  Specialty Comments:  No specialty comments available.  Imaging: No results found.   PMFS History: Patient Active Problem List   Diagnosis Date Noted  . Impingement syndrome of right shoulder 04/22/2019  . History of dental surgery 02/05/2019  . COPD GOLD 0 ? AB  12/06/2018  . 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 GERD (gastroesophageal reflux disease)   . Breast pain 06/23/2018  . Dyspareunia in female 09/13/2017  . Cervicalgia 04/24/2017  . CKD (chronic kidney disease) 10/29/2016    . Chronic pain syndrome 05/10/2016  . MDD (major depressive disorder) 10/19/2015  . Dyspnea on exertion 08/22/2015  . HTN (hypertension) 07/08/2015  . Routine health maintenance 07/08/2015  . Tobacco abuse 07/08/2015   Past Medical History:  Diagnosis Date  . Adnexal mass 2019   likely remnant fibroid on broad ligament; getting diagnostic lap  . Anxiety   . Arthritis    hip  . Asthma   . Carpal tunnel syndrome    bilateral  . Chest wall pain 10/15/2016  . Chronic lower back pain   . Depression   . Dyspnea    with exertion  . GERD (gastroesophageal reflux disease)   . Headache    migraines  . Herpes   . Hypertension   . Liver lesion, right lobe 12/01/2015   CT chest without contrast 11/30/15 with hepatic lesions measuring 2.0 x 1.7 cm of the right lobe and 1 x 1 cm on the dome. CT abd/pel 8/18: 1.3cm inferior R liver lobe, prob benign but recommend 3-23mo f/u with MRI w/wo IV contrast with attenuation MRI 6/19: combination of benign cavernous hemangiomas and small benign cysts  . Poor dental hygiene    chipped upper front and several loose teeth  . Substance abuse ([place])    cocaine/crack cocaine - last use 02/07/18  . SVD (spontaneous vaginal delivery)    x 1  . Uses walker    for ambulation  . Uterine leiomyoma 02/16/2016   s/p vaginal hysterectomy 2018  . Vaginal discharge 03/26/2018    Family History  Problem Relation Age of Onset  . Cancer Mother   . Hypertension Mother   . Diabetes Mother   . Breast cancer Mother        diagnosed in her 29's  . Stroke Father     Past Surgical History:  Procedure Laterality Date  . LAPAROSCOPIC OVARIAN CYSTECTOMY Right 02/25/2018   Procedure: LAPAROSCOPIC REMOVAL OF RIGHT ADNEXAL MASS;  Surgeon: Chancy Milroy, MD;  Location: [facility];  Service: Gynecology;  Laterality: Right;  . SALPINGECTOMY Left    lap - ectopic preg  . TOTAL HIP ARTHROPLASTY Left 01/20/2016   Procedure: LEFT TOTAL HIP ARTHROPLASTY ANTERIOR APPROACH;   Surgeon: Mcarthur Rossetti, MD;  Location: [facility];  Service: Orthopedics;  Laterality: Left;  . TUBAL LIGATION    . VAGINAL HYSTERECTOMY Right 12/11/2016   Procedure: HYSTERECTOMY VAGINAL WITH RIGHT SALPINGECTOMY;  Surgeon: Chancy Milroy, MD;  Location: [facility];  Service: Gynecology;  Laterality: Right;   Social History   Occupational History  . Not on file  Tobacco Use  . Smoking status: Former Smoker    Packs/day: 0.50    Years: 29.00  Pack years: 14.50    Types: Cigarettes    Quit date: 11/25/2018    Years since quitting: 0.4  . Smokeless tobacco: Never Used  Substance and Sexual Activity  . Alcohol use: Yes    Alcohol/week: 3.0 standard drinks    Types: 3 Cans of beer per week    Comment: Occasional Beer   . Drug use: No    Types: "Crack" cocaine, Cocaine    Comment: Last use 2 wks ago 02/07/18  . Sexual activity: Yes    Partners: Male    Birth control/protection: None, Post-menopausal    Comment: Hysterectomy

## 2019-04-23 ENCOUNTER — Ambulatory Visit: Payer: Medicaid Other | Admitting: Internal Medicine

## 2019-04-23 ENCOUNTER — Telehealth: Payer: Self-pay | Admitting: Orthopaedic Surgery

## 2019-04-23 NOTE — Telephone Encounter (Signed)
noted 

## 2019-04-23 NOTE — Telephone Encounter (Signed)
Patient called and stated that Tylenol #3 is not helping. Had injection yesterday and she is still not feeling well. Stating pain is making BP go up.  Please call patient to advise.

## 2019-04-23 NOTE — Telephone Encounter (Signed)
I called and discussed.  Not a good idea for stronger narcotics with past history of cocaine and crack addiction as recent as 2019.  I discussed recommendations to try some Aspercreme, ice, heat, anti-inflammatories.  If her blood pressure is elevated she can see her PCP about this.FYI

## 2019-04-23 NOTE — Telephone Encounter (Signed)
Please advise 

## 2019-04-24 ENCOUNTER — Ambulatory Visit: Payer: Medicaid Other | Admitting: Internal Medicine

## 2019-05-27 ENCOUNTER — Other Ambulatory Visit: Payer: Self-pay

## 2019-05-27 ENCOUNTER — Ambulatory Visit (INDEPENDENT_AMBULATORY_CARE_PROVIDER_SITE_OTHER): Payer: Medicaid Other | Admitting: Internal Medicine

## 2019-05-27 ENCOUNTER — Encounter: Payer: Self-pay | Admitting: Internal Medicine

## 2019-05-27 VITALS — BP 155/98 | HR 74 | Temp 98.7°F | Ht 64.0 in | Wt 151.1 lb

## 2019-05-27 DIAGNOSIS — Z96642 Presence of left artificial hip joint: Secondary | ICD-10-CM | POA: Diagnosis not present

## 2019-05-27 DIAGNOSIS — Z79899 Other long term (current) drug therapy: Secondary | ICD-10-CM

## 2019-05-27 DIAGNOSIS — I129 Hypertensive chronic kidney disease with stage 1 through stage 4 chronic kidney disease, or unspecified chronic kidney disease: Secondary | ICD-10-CM | POA: Diagnosis not present

## 2019-05-27 DIAGNOSIS — N189 Chronic kidney disease, unspecified: Secondary | ICD-10-CM | POA: Diagnosis not present

## 2019-05-27 DIAGNOSIS — M25559 Pain in unspecified hip: Secondary | ICD-10-CM

## 2019-05-27 DIAGNOSIS — G894 Chronic pain syndrome: Secondary | ICD-10-CM | POA: Diagnosis not present

## 2019-05-27 DIAGNOSIS — F321 Major depressive disorder, single episode, moderate: Secondary | ICD-10-CM

## 2019-05-27 DIAGNOSIS — M25552 Pain in left hip: Secondary | ICD-10-CM | POA: Diagnosis present

## 2019-05-27 DIAGNOSIS — F329 Major depressive disorder, single episode, unspecified: Secondary | ICD-10-CM | POA: Diagnosis not present

## 2019-05-27 DIAGNOSIS — N182 Chronic kidney disease, stage 2 (mild): Secondary | ICD-10-CM | POA: Diagnosis present

## 2019-05-27 DIAGNOSIS — I10 Essential (primary) hypertension: Secondary | ICD-10-CM

## 2019-05-27 DIAGNOSIS — G8929 Other chronic pain: Secondary | ICD-10-CM

## 2019-05-27 DIAGNOSIS — K219 Gastro-esophageal reflux disease without esophagitis: Secondary | ICD-10-CM | POA: Diagnosis not present

## 2019-05-27 MED ORDER — CYCLOBENZAPRINE HCL 10 MG PO TABS: 10.0000 mg | ORAL_TABLET | Freq: Two times a day (BID) | ORAL | 3 refills | Status: DC | PRN

## 2019-05-27 MED ORDER — LOSARTAN POTASSIUM 25 MG PO TABS: 25.0000 mg | ORAL_TABLET | Freq: Every day | ORAL | 2 refills | Status: DC

## 2019-05-27 NOTE — Progress Notes (Signed)
x

## 2019-05-27 NOTE — Progress Notes (Signed)
   CC: Hip pain   HPI:  Ms.Maram D Burchell is a 54 y.o. with a PMHx HTN, chronic pain syndrome s/p left hip replacement, MDD, GERD, who presents to the clinic for Left hip pain.   Please see the Encounters tab for problem-based Assessment & Plan regarding status of patient's chronic conditions.  Past Medical History:  Diagnosis Date  . Adnexal mass 2019   likely remnant fibroid on broad ligament; getting diagnostic lap  . Anxiety   . Arthritis    hip  . Asthma   . Carpal tunnel syndrome    bilateral  . Chest wall pain 10/15/2016  . Chronic lower back pain   . Depression   . Dyspnea    with exertion  . GERD (gastroesophageal reflux disease)   . Headache    migraines  . Herpes   . Hypertension   . Liver lesion, right lobe 12/01/2015   CT chest without contrast 11/30/15 with hepatic lesions measuring 2.0 x 1.7 cm of the right lobe and 1 x 1 cm on the dome. CT abd/pel 8/18: 1.3cm inferior R liver lobe, prob benign but recommend 3-40mo f/u with MRI w/wo IV contrast with attenuation MRI 6/19: combination of benign cavernous hemangiomas and small benign cysts  . Poor dental hygiene    chipped upper front and several loose teeth  . Substance abuse ([place])    cocaine/crack cocaine - last use 02/07/18  . SVD (spontaneous vaginal delivery)    x 1  . Uses walker    for ambulation  . Uterine leiomyoma 02/16/2016   s/p vaginal hysterectomy 2018  . Vaginal discharge 03/26/2018   Review of Systems: Review of Systems  Constitutional: Negative for chills and fever.  Cardiovascular: Negative for chest pain and palpitations.  Musculoskeletal: Positive for joint pain and myalgias.  Psychiatric/Behavioral: Positive for depression.   Physical Exam:  Vitals:   05/27/19 1544  BP: (!) 155/98  Pulse: 74  Temp: 98.7 F (37.1 C)  TempSrc: Oral  SpO2: 97%  Weight: 151 lb 1.6 oz (68.5 kg)  Height: 5\' 4"  (1.626 m)   Physical Exam Vitals and nursing note reviewed.  Constitutional:    General: She is not in acute distress.    Appearance: She is normal weight.  Pulmonary:     Effort: Pulmonary effort is normal. No respiratory distress.  Musculoskeletal:     Right hip: Normal. No tenderness or bony tenderness.     Left hip: Tenderness (Significantly tender in the left lateral hip region) and bony tenderness present.     Right upper leg: Normal.     Left upper leg: Tenderness present. No deformity.  Skin:    General: Skin is warm and dry.  Neurological:     Mental Status: She is alert and oriented to person, place, and time.     Gait: Gait abnormal.  Psychiatric:        Attention and Perception: Attention normal.        Mood and Affect: Affect is tearful.        Behavior: Behavior normal. Behavior is cooperative.    Assessment & Plan:   See Encounters Tab for problem based charting.  Patient discussed with Dr. Philipp Ovens

## 2019-05-28 ENCOUNTER — Telehealth: Payer: Self-pay

## 2019-05-28 ENCOUNTER — Other Ambulatory Visit: Payer: Self-pay | Admitting: Internal Medicine

## 2019-05-28 LAB — BMP8+ANION GAP
Anion Gap: 16 mmol/L (ref 10.0–18.0)
BUN/Creatinine Ratio: 14 (ref 9–23)
BUN: 18 mg/dL (ref 6–24)
CO2: 22 mmol/L (ref 20–29)
Calcium: 10.7 mg/dL — ABNORMAL HIGH (ref 8.7–10.2)
Chloride: 101 mmol/L (ref 96–106)
Creatinine, Ser: 1.27 mg/dL — ABNORMAL HIGH (ref 0.57–1.00)
GFR calc Af Amer: 55 mL/min/{1.73_m2} — ABNORMAL LOW (ref >59)
GFR calc non Af Amer: 47 mL/min/{1.73_m2} — ABNORMAL LOW (ref >59)
Glucose: 85 mg/dL (ref 65–99)
Potassium: 5 mmol/L (ref 3.5–5.2)
Sodium: 139 mmol/L (ref 134–144)

## 2019-05-28 NOTE — Telephone Encounter (Signed)
Called pt - no answer; voice mail has not been set-up.

## 2019-05-28 NOTE — Telephone Encounter (Signed)
Pt rtc, gave her dr basaraba's message, she has not started the flexeril, will start today, ask her to call back in 5 days if no better, she was agreeable

## 2019-05-28 NOTE — Telephone Encounter (Signed)
Pt was seen yesterday; sending request to PCP. Called pt - informed Flexeril was refilled .Stated Advil and Tylenol do not help she asked about Tramadol. Thanks

## 2019-05-28 NOTE — Telephone Encounter (Signed)
Pt is wanting something for pain, pls contact [phone]

## 2019-05-28 NOTE — Telephone Encounter (Signed)
Hello! I did refill her Flexeril yesterday. If she is in pain, she will need to start with the Flexeril. At this time, patient may not be a great candidate for opiates, like Tramadol.

## 2019-05-28 NOTE — Telephone Encounter (Signed)
I called [facility] for [phone] for [facility] I faxed order to [phone] will wait for response [place], [place] C1/21/202111:46 AM

## 2019-05-29 NOTE — Telephone Encounter (Signed)
Good morning , Unfortunately, we are unable to service Jean [name]. So sorry we aren't able to help out with her.   Joen Laura, RN CCM   [facility], [address], [place] [address] P: [phone] Fax: [phone] [name] has come home

## 2019-05-31 NOTE — Assessment & Plan Note (Addendum)
Jean [name] states that she has been having a difficult time at home due to her living situations.  She becomes teary-eyed while describing her stress related to this.  She lives in a boarding home with all meals and is scared to even shower or use the restroom.  This is exacerbated by her generalized weakness with chronic pain and inability to get help at home.  She does have family in a support system, but feels like a burden.  She is currently taking Zoloft 100 mg.  In the past she has talked to [facility] once before and she reports enjoying that conversation.  We will replace referral to [facility].  Plan: -Continue Zoloft 100 mg -PHQ-9 at every visit -Referral to integrated behavioral health placed

## 2019-05-31 NOTE — Assessment & Plan Note (Addendum)
BMP checked today and creatinine continues to be elevated but at baseline.  Jean [name] has not had previous work-up for this before however given her longstanding history of hypertension, likely related to that.  BUN/creatinine ratio is 14.  We will continue to monitor closely and plan to refer to nephrology if any changes.  Plan: -BMP in 6 months

## 2019-05-31 NOTE — Assessment & Plan Note (Signed)
Jean [name] continues to have daily pain that limits her ability to perform ADLs safely and effectively.  In the past she has had home health PT and OT that has helped significantly and she is interested in doing that again.  In addition, she uses Flexeril as needed up to 2 times daily for relief of symptoms enough so to allow her to function.  Patient is not a good candidate for opiates due to prior history of addiction.  On examination, she has significant point tenderness to the left hip.  Plan: -Referral placed for home health -Refilled Flexeril

## 2019-05-31 NOTE — Assessment & Plan Note (Addendum)
BP: 155/98  Blood pressure continues to be slightly under controlled, but she is having pain today from her left hip that has acutely worsened. Given hx of CKD likely from HTN, she would benefit from tighter BP control. At this time, will add low dose Losartan.    Per chart review, patient notes facial swelling from a hypertensive assumed to be lisinopril.  Discussed this with patient and gave strict and clear instructions to discontinue use and call the clinic if she should have any facial swelling.  She expressed understanding and agreement with plan.  Plan: -Start losartan 25 mg -Continue amlodipine and HCTZ -Follow-up in 1 month for repeat labs and blood pressure measure

## 2019-06-02 NOTE — Progress Notes (Signed)
Internal Medicine Clinic Attending  Case discussed with Dr. Basaraba at the time of the visit.  We reviewed the resident's history and exam and pertinent patient test results.  I agree with the assessment, diagnosis, and plan of care documented in the resident's note.    

## 2019-06-02 NOTE — Addendum Note (Signed)
Addended by: Jodean Lima on: 06/02/2019 08:40 AM   Modules accepted: Level of Service

## 2019-06-03 NOTE — Telephone Encounter (Signed)
I sent a community message to [name] with [facility] they will not be able to take this patient Jean [name] C1/27/202110:24 AM

## 2019-06-04 NOTE — Telephone Encounter (Signed)
I sent a community Message to [employer] they will not be able to take patient due to staffing [employer], [place] C1/28/20219:44 AM

## 2019-06-10 ENCOUNTER — Telehealth: Payer: Self-pay | Admitting: Internal Medicine

## 2019-06-10 ENCOUNTER — Telehealth: Payer: Self-pay | Admitting: Licensed Clinical Social Worker

## 2019-06-10 ENCOUNTER — Encounter: Payer: Self-pay | Admitting: Licensed Clinical Social Worker

## 2019-06-10 NOTE — Telephone Encounter (Signed)
Patient was called back. Patient agreed to services, and will be added to my schedule for 2/23.

## 2019-06-10 NOTE — Telephone Encounter (Signed)
Pt is returning your call [phone]

## 2019-06-10 NOTE — Telephone Encounter (Signed)
Patient was called to discuss the referral for services. Patient did not answer, and no vm was available. A letter will also be mailed today.

## 2019-06-29 ENCOUNTER — Other Ambulatory Visit: Payer: Self-pay | Admitting: Orthopaedic Surgery

## 2019-06-29 DIAGNOSIS — M79641 Pain in right hand: Secondary | ICD-10-CM

## 2019-06-30 ENCOUNTER — Telehealth: Payer: Self-pay | Admitting: Licensed Clinical Social Worker

## 2019-06-30 ENCOUNTER — Ambulatory Visit: Payer: Medicaid Other | Admitting: Licensed Clinical Social Worker

## 2019-06-30 NOTE — Telephone Encounter (Signed)
Patient was called twice for her scheduled visit. Patient did not answer either call. No voicemail was available.

## 2019-07-01 ENCOUNTER — Encounter: Payer: Medicaid Other | Admitting: Internal Medicine

## 2019-07-22 ENCOUNTER — Telehealth: Payer: Self-pay | Admitting: *Deleted

## 2019-07-22 ENCOUNTER — Encounter: Payer: Medicaid Other | Admitting: Internal Medicine

## 2019-07-22 NOTE — Addendum Note (Signed)
Addended by: Hulan Fray on: 07/22/2019 05:26 PM   Modules accepted: Orders

## 2019-07-22 NOTE — Telephone Encounter (Signed)
Pt was a no show for her appt today. Called pt to reschedule her appt - no answer and "vm has not been set-up yet", unable to leave a message.

## 2019-07-27 NOTE — Addendum Note (Signed)
Addended by: Hulan Fray on: 07/27/2019 05:51 PM   Modules accepted: Orders

## 2019-08-27 ENCOUNTER — Ambulatory Visit: Payer: Medicaid Other

## 2019-08-27 ENCOUNTER — Telehealth: Payer: Self-pay | Admitting: *Deleted

## 2019-08-27 NOTE — Telephone Encounter (Signed)
Call to patient concerning missed appointment scheduled for today.  Unable to leave a message as patient's phone number has not been set up for voice messages.  Sander Nephew, RN [phone].

## 2019-09-04 ENCOUNTER — Ambulatory Visit: Payer: Medicaid Other | Admitting: Orthopaedic Surgery

## 2019-09-07 ENCOUNTER — Ambulatory Visit: Payer: Medicaid Other | Admitting: Orthopaedic Surgery

## 2019-09-10 ENCOUNTER — Ambulatory Visit (INDEPENDENT_AMBULATORY_CARE_PROVIDER_SITE_OTHER): Payer: Medicaid Other | Admitting: Surgery

## 2019-09-10 ENCOUNTER — Other Ambulatory Visit: Payer: Self-pay

## 2019-09-10 ENCOUNTER — Encounter: Payer: Self-pay | Admitting: Surgery

## 2019-09-10 ENCOUNTER — Ambulatory Visit (INDEPENDENT_AMBULATORY_CARE_PROVIDER_SITE_OTHER): Payer: Medicaid Other

## 2019-09-10 VITALS — BP 148/91 | HR 65 | Ht 64.0 in | Wt 151.0 lb

## 2019-09-10 DIAGNOSIS — M5441 Lumbago with sciatica, right side: Secondary | ICD-10-CM | POA: Diagnosis not present

## 2019-09-10 DIAGNOSIS — M2550 Pain in unspecified joint: Secondary | ICD-10-CM | POA: Diagnosis not present

## 2019-09-10 DIAGNOSIS — M7061 Trochanteric bursitis, right hip: Secondary | ICD-10-CM

## 2019-09-10 DIAGNOSIS — M4317 Spondylolisthesis, lumbosacral region: Secondary | ICD-10-CM | POA: Diagnosis not present

## 2019-09-10 MED ORDER — METHYLPREDNISOLONE 4 MG PO TABS: ORAL_TABLET | ORAL | 0 refills | Status: DC

## 2019-09-10 NOTE — Progress Notes (Signed)
Office Visit Note   Patient: Jean [name]           Date of Birth: 02-10-1966           MRN: [id number] Visit Date: 09/10/2019              Requested by: Jose Persia, MD [address] [place],  [place] [address] PCP: Jose Persia, MD   Assessment & Plan: Visit Diagnoses:  1. Acute right-sided low back pain with right-sided sciatica   2. Spondylolisthesis of lumbosacral region   3. Polyarthralgia     Plan: With patient's chronic low back pain that is failed conservative treatment over the years I recommend getting a lumbar MRI to rule out HNP/stenosis.  Patient also has an L5-S1 spondylolisthesis.  With polyarthralgia and question family history of inflammatory arthropathy recommend getting blood work to check a CBC and arthritis panel.  Patient will follow-up with Dr. Lorin Mercy in 3 weeks to review MRI and blood work results.  I did send in a prescription for Medrol Dosepak 6-day taper to be taken as directed.  At some point we may consider right hip greater trochanter bursa Marcaine/Depo-Medrol injection depending upon how that does.  Follow-Up Instructions: Return in about 3 weeks (around 10/01/2019) for With Dr. Lorin Mercy to review lumbar MRI and blood work.   Orders:  Orders Placed This Encounter  Procedures  . XR Lumbar Spine 2-3 Views  . MR Lumbar Spine w/o contrast  . Antinuclear Antib (ANA)  . CBC  . Sed Rate (ESR)  . Rheumatoid Factor  . Uric acid   Meds ordered this encounter  Medications  . methylPREDNISolone (MEDROL) 4 MG tablet    Sig: 6-day taper to be taken as directed    Dispense:  21 tablet    Refill:  0      Procedures: No procedures performed   Clinical Data: No additional findings.   Subjective: Chief Complaint  Patient presents with  . Lower Back - Pain    HPI 54 year old black female comes in today with complaints of worsening low back pain, spasms and right lower extremity radicular pain.  Patient has a chronic history of  low back pain and has seen Dr. [name] for this in the past.  States that this is been progressively getting worse over the last several weeks.  No injury.  Pain more on the right side lumbar that radiates into the right buttock and right thigh.  No radicular pain on the left.  Pain when she is up ambulating and bending.  Patient uses a rolling walker to help ambulate due to chronic bilateral knee pain.  Patient has also had left total hip replacement and she states that she still has some discomfort there.  Patient also admits to having pain in the bilateral hands and wrist and ankles.  Says that she does have a family history of possible rheumatoid arthritis. Review of Systems No current cardiac pulmonary GI GU issues  Objective: Vital Signs: BP (!) 148/91   Pulse 65   Ht _0  (1.626 m)   Wt 151 lb (68.5 kg)   LMP  (LMP Unknown)   BMI 25.92 kg/m   Physical Exam HENT:     Head: Normocephalic.  Eyes:     Pupils: Pupils are equal, round, and reactive to light.  Pulmonary:     Effort: No respiratory distress.  Musculoskeletal:     Comments: Gait is antalgic.  Patient has right greater than  left lumbar paraspinal tenderness/spasm.  Moderate to marked is over the right hip greater trochanter bursa.  Negative log about his.  Negative straight leg raise.  Neurovas intact.  No focal motor deficits.  Neurological:     General: No focal deficit present.     Mental Status: She is alert and oriented to person, place, and time.  Psychiatric:        Mood and Affect: Mood normal.     Ortho Exam  Specialty Comments:  No specialty comments available.  Imaging: No results found.   PMFS History: Patient Active Problem List   Diagnosis Date Noted  . Impingement syndrome of right shoulder 04/22/2019  . History of dental surgery 02/05/2019  . COPD GOLD 0 ? AB  12/06/2018  . 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 GERD (gastroesophageal reflux disease)   .  Breast pain 06/23/2018  . Dyspareunia in female 09/13/2017  . Cervicalgia 04/24/2017  . CKD (chronic kidney disease) 10/29/2016  . Chronic pain syndrome 05/10/2016  . MDD (major depressive disorder) 10/19/2015  . Dyspnea on exertion 08/22/2015  . HTN (hypertension) 07/08/2015  . Routine health maintenance 07/08/2015  . Tobacco abuse 07/08/2015   Past Medical History:  Diagnosis Date  . Adnexal mass 2019   likely remnant fibroid on broad ligament; getting diagnostic lap  . Anxiety   . Arthritis    hip  . Asthma   . Carpal tunnel syndrome    bilateral  . Chest wall pain 10/15/2016  . Chronic lower back pain   . Depression   . Dyspnea    with exertion  . GERD (gastroesophageal reflux disease)   . Headache    migraines  . Herpes   . Hypertension   . Liver lesion, right lobe 12/01/2015   CT chest without contrast 11/30/15 with hepatic lesions measuring 2.0 x 1.7 cm of the right lobe and 1 x 1 cm on the dome. CT abd/pel 8/18: 1.3cm inferior R liver lobe, prob benign but recommend 3-39mof/u with MRI w/wo IV contrast with attenuation MRI 6/19: combination of benign cavernous hemangiomas and small benign cysts  . Poor dental hygiene    chipped upper front and several loose teeth  . Substance abuse ([place]    cocaine/crack cocaine - last use 02/07/18  . SVD (spontaneous vaginal delivery)    x 1  . Uses walker    for ambulation  . Uterine leiomyoma 02/16/2016   s/p vaginal hysterectomy 2018  . Vaginal discharge 03/26/2018    Family History  Problem Relation Age of Onset  . Cancer Mother   . Hypertension Mother   . Diabetes Mother   . Breast cancer Mother        diagnosed in her 657's . Stroke Father     Past Surgical History:  Procedure Laterality Date  . LAPAROSCOPIC OVARIAN CYSTECTOMY Right 02/25/2018   Procedure: LAPAROSCOPIC REMOVAL OF RIGHT ADNEXAL MASS;  Surgeon: EChancy Milroy MD;  [facility];  Service: Gynecology;  Laterality: Right;  . SALPINGECTOMY Left     lap - ectopic preg  . TOTAL HIP ARTHROPLASTY Left 01/20/2016   Procedure: LEFT TOTAL HIP ARTHROPLASTY ANTERIOR APPROACH;  Surgeon: CMcarthur Rossetti MD;  Location: [facility];  Service: Orthopedics;  Laterality: Left;  . TUBAL LIGATION    . VAGINAL HYSTERECTOMY Right 12/11/2016   Procedure: HYSTERECTOMY VAGINAL WITH RIGHT SALPINGECTOMY;  Surgeon: EChancy Milroy MD;  [facility];  Service: Gynecology;  Laterality: Right;   Social History   Occupational History  . Not on file  Tobacco Use  . Smoking status: Former Smoker    Packs/day: 0.50    Years: 29.00    Pack years: 14.50    Types: Cigarettes    Quit date: 11/25/2018    Years since quitting: 0.7  . Smokeless tobacco: Never Used  Substance and Sexual Activity  . Alcohol use: Yes    Alcohol/week: 3.0 standard drinks    Types: 3 Cans of beer per week    Comment: Occasional Beer   . Drug use: No    Types: "Crack" cocaine, Cocaine    Comment: last 6 months ago.  Marland Kitchen Sexual activity: Yes    Partners: Male    Birth control/protection: None, Post-menopausal    Comment: Hysterectomy

## 2019-09-15 LAB — CBC
HCT: 40.3 % (ref 35.0–45.0)
Hemoglobin: 13.6 g/dL (ref 11.7–15.5)
MCH: 31.5 pg (ref 27.0–33.0)
MCHC: 33.7 g/dL (ref 32.0–36.0)
MCV: 93.3 fL (ref 80.0–100.0)
MPV: 11 fL (ref 7.5–12.5)
Platelets: 391 10*3/uL (ref 140–400)
RBC: 4.32 10*6/uL (ref 3.80–5.10)
RDW: 12.1 % (ref 11.0–15.0)
WBC: 6 10*3/uL (ref 3.8–10.8)

## 2019-09-15 LAB — RHEUMATOID FACTOR: Rhuematoid fact SerPl-aCnc: <14 IU/mL (ref <14)

## 2019-09-15 LAB — ANTI-NUCLEAR AB-TITER (ANA TITER): ANA Titer 1: 1:40 {titer} — ABNORMAL HIGH

## 2019-09-15 LAB — SEDIMENTATION RATE

## 2019-09-15 LAB — URIC ACID: Uric Acid, Serum: 6.7 mg/dL (ref 2.5–7.0)

## 2019-09-15 LAB — SED RATE MANUAL WEST RFLX: SED RATE BY MODIFIED WESTERGREN,MANUAL: 20 mm/h (ref 0–30)

## 2019-09-15 LAB — ANA: Anti Nuclear Antibody (ANA): POSITIVE — AB

## 2019-09-17 ENCOUNTER — Telehealth: Payer: Self-pay | Admitting: Surgery

## 2019-09-17 NOTE — Telephone Encounter (Signed)
I called , needs to see PCP and get electrolytes checked, cramping in entire body hands , feet , legs etc. May have low potassium.

## 2019-09-17 NOTE — Telephone Encounter (Signed)
Can you please advise?

## 2019-09-17 NOTE — Telephone Encounter (Signed)
Patient called advised her back,legs,feet and toes are having muscle spasms really bad. Patient said she can not straighten out her toe. Patient said the muscle relaxer she is taking is not working. The number to contact patient is [phone]

## 2019-09-23 ENCOUNTER — Other Ambulatory Visit: Payer: Self-pay | Admitting: Internal Medicine

## 2019-09-23 DIAGNOSIS — R0609 Other forms of dyspnea: Secondary | ICD-10-CM

## 2019-10-13 ENCOUNTER — Other Ambulatory Visit: Payer: Self-pay | Admitting: Surgery

## 2019-10-13 ENCOUNTER — Telehealth: Payer: Self-pay

## 2019-10-13 NOTE — Telephone Encounter (Signed)
Taren with [facility] called stating that MRI appt. has to be canceled until authorized.  CB# [id number].  Please advise.  Thank you.

## 2019-10-14 ENCOUNTER — Other Ambulatory Visit: Payer: Medicaid Other

## 2019-10-14 NOTE — Telephone Encounter (Signed)
Pending authorization thru [employer]

## 2019-10-20 ENCOUNTER — Ambulatory Visit: Payer: Medicaid Other | Admitting: Orthopaedic Surgery

## 2019-10-23 ENCOUNTER — Ambulatory Visit
Admission: RE | Admit: 2019-10-23 | Discharge: 2019-10-23 | Disposition: A | Discharge status: Home / Self Care | Payer: Medicaid Other | Source: Ambulatory Visit | Attending: Surgery | Admitting: Surgery

## 2019-10-23 ENCOUNTER — Other Ambulatory Visit: Payer: Self-pay

## 2019-10-23 DIAGNOSIS — M4317 Spondylolisthesis, lumbosacral region: Secondary | ICD-10-CM

## 2019-10-27 ENCOUNTER — Telehealth: Payer: Self-pay | Admitting: Orthopaedic Surgery

## 2019-10-27 NOTE — Telephone Encounter (Signed)
Patient called. She would like Betsy to call her. [phone] has some questions about her MRI.

## 2019-10-28 ENCOUNTER — Ambulatory Visit: Payer: Medicaid Other | Admitting: Internal Medicine

## 2019-10-28 ENCOUNTER — Encounter: Payer: Self-pay | Admitting: Internal Medicine

## 2019-10-28 VITALS — BP 165/89 | HR 58 | Temp 98.3°F | Ht 64.0 in | Wt 150.6 lb

## 2019-10-28 DIAGNOSIS — J449 Chronic obstructive pulmonary disease, unspecified: Secondary | ICD-10-CM | POA: Diagnosis not present

## 2019-10-28 DIAGNOSIS — I10 Essential (primary) hypertension: Secondary | ICD-10-CM | POA: Diagnosis not present

## 2019-10-28 DIAGNOSIS — R0600 Dyspnea, unspecified: Secondary | ICD-10-CM | POA: Diagnosis not present

## 2019-10-28 DIAGNOSIS — R0609 Other forms of dyspnea: Secondary | ICD-10-CM

## 2019-10-28 DIAGNOSIS — N644 Mastodynia: Secondary | ICD-10-CM | POA: Diagnosis present

## 2019-10-28 MED ORDER — ALBUTEROL SULFATE HFA 108 (90 BASE) MCG/ACT IN AERS: INHALATION_SPRAY | RESPIRATORY_TRACT | 1 refills | Status: DC

## 2019-10-28 MED ORDER — LOSARTAN POTASSIUM 25 MG PO TABS: 50.0000 mg | ORAL_TABLET | Freq: Every day | ORAL | 0 refills | Status: DC

## 2019-10-28 NOTE — Assessment & Plan Note (Addendum)
BP today is elevated at 165/89 with HR of 58.  Currently on Amlodpine 10 mg QD, HCTZ 25 mg QD, Losartan 25 mg QD  See Encounters Tab for problem based charting. Reports BP at home is never lower than 160/80.  -Patient declined BMP today. She is willing to do BMP next visit -Increasing Losartan to 50 mg QD -Continue Amlodipine 10 mg QD and HCTZ 25 mg QD -F/u in clinic in 2-4 weeks -BMP next visit

## 2019-10-28 NOTE — Telephone Encounter (Signed)
I spoke with patient. She brought handicap placard in and would like to know if we can complete it?  If so, please advise on how long.  Also, appointment made for MRI review on 11/03/2019 at 1pm. She will pick up form then if it is completed.

## 2019-10-28 NOTE — Assessment & Plan Note (Signed)
Breast pain: This is a recurrent problem. She had similar problem before about a year ago but it resolved by its own. She again has some pain pain at both breast L>R and alsoe some bruise under her breast. No redness, no skin changes other than the bruise. No itching, no drainage from nipples. She does self exam and did not notice mass.  The pain is sharp and intermittent. It has the worse pain when she gets up in the morning. She feels heaviness and sharp pain. It goes away when she moves around (almost lasts 30 minutes.) No fever or chills. She does not have menstrual period (had hysterectomy years ago  On physical exam: Diffuse breast tenderness. No skin changes and no nipple retraction or discharge. Small soft mobile breast lump at upper outer quadrant of lft breast. Likely normal tissue but will need further evaluation. Also has probable rt axillary soft mobile LAP.  She was seen at [facility] for similar problem 06/23/2018. MM ordered that showed calcifications in the bilateral breasts correspond with benign milk of calcium and fibrocystic change, which might be a cause of bilateral diffuse breast pain. Screening mammogram in one year recommended that has not been performed yet.  Given probable small mass at left lateral upper quadrant, will perform diagnostic MM.  -Ordered diagnostic MM today -Use supportive bra -f/u in clinic in 2-4 weeks (when MM resulted)

## 2019-10-28 NOTE — Progress Notes (Signed)
CC: Bilateral breast pain  HPI:  [name] is a 54 y.o. female with PMHx as documented below, presented with bilateral L>R. Please refer to problem based charting for further details and assessment and plan of current problem and chronic medical conditions.  PMHx: COPD, HTN, CKD, Chronic pain syndrome, MDD  Past Medical History:  Diagnosis Date  . Adnexal mass 2019   likely remnant fibroid on broad ligament; getting diagnostic lap  . Anxiety   . Arthritis    hip  . Asthma   . Carpal tunnel syndrome    bilateral  . Chest wall pain 10/15/2016  . Chronic lower back pain   . Depression   . Dyspnea    with exertion  . GERD (gastroesophageal reflux disease)   . Headache    migraines  . Herpes   . Hypertension   . Liver lesion, right lobe 12/01/2015   CT chest without contrast 11/30/15 with hepatic lesions measuring 2.0 x 1.7 cm of the right lobe and 1 x 1 cm on the dome. CT abd/pel 8/18: 1.3cm inferior R liver lobe, prob benign but recommend 3-61mo f/u with MRI w/wo IV contrast with attenuation MRI 6/19: combination of benign cavernous hemangiomas and small benign cysts  . Poor dental hygiene    chipped upper front and several loose teeth  . Substance abuse ([place])    cocaine/crack cocaine - last use 02/07/18  . SVD (spontaneous vaginal delivery)    x 1  . Uses walker    for ambulation  . Uterine leiomyoma 02/16/2016   s/p vaginal hysterectomy 2018  . Vaginal discharge 03/26/2018   Review of Systems:   Review of Systems  Constitutional: Negative for chills and fever.  Respiratory: Positive for shortness of breath. Negative for cough.   Cardiovascular: Negative for leg swelling.  Gastrointestinal: Negative for abdominal pain, diarrhea and vomiting.  Musculoskeletal:       Breast pain    Physical Exam:  Vitals:   10/28/19 1004  BP: (!) 165/89  Pulse: (!) 58  Temp: 98.3 F (36.8 C)  TempSrc: Oral  SpO2: 99%  Weight: 150 lb 9.6 oz (68.3 kg)  Physical  Exam Constitutional:      General: She is not in acute distress.    Appearance: Normal appearance. She is not ill-appearing.  Eyes:     Extraocular Movements: Extraocular movements intact.  Cardiovascular:     Rate and Rhythm: Normal rate and regular rhythm.     Heart sounds: No murmur heard.   Pulmonary:     Effort: Pulmonary effort is normal.     Breath sounds: Normal breath sounds. No wheezing or rales.  Chest:     Breasts: Breasts are symmetrical.        Right: Tenderness present. No swelling, bleeding, inverted nipple, nipple discharge or skin change.        Left: Tenderness present. No swelling, bleeding, inverted nipple, nipple discharge or skin change.    Abdominal:     Palpations: Abdomen is soft.  Musculoskeletal:     Right lower leg: No edema.     Left lower leg: No edema.  Lymphadenopathy:     Upper Body:     Right upper body: Axillary adenopathy present.  Neurological:     Mental Status: She is alert and oriented to person, place, and time. Mental status is at baseline.  Psychiatric:        Mood and Affect: Mood normal.  Behavior: Behavior normal.        Thought Content: Thought content normal.      Assessment & Plan:   Patient discussed with Dr. Rebeca Alert

## 2019-10-28 NOTE — Assessment & Plan Note (Signed)
Sent refill for Albuterol today.

## 2019-10-28 NOTE — Telephone Encounter (Signed)
Noted. Will hold for patient appt.

## 2019-10-28 NOTE — Telephone Encounter (Signed)
We can discuss at [facility]

## 2019-10-28 NOTE — Patient Instructions (Addendum)
Thank you for allowing [place] to provide your care today. Today we discussed your blood pressure and breast pain. Your blood pressure is high today.  I increase the dose of Losartan for you.  1-Please take 2 tablets of Losartan (total of 50 mg) once a day 2- Continue taking rest of your medications 3-I order a mammogram to be done  4-Use sport bra for more breast support 5-Follow up in clinic in 2-4 weeks with your primary care doctor for blood pressure check and follow up of breast pain.  We will do blood work next visit (I understand you are not willing to do that today)   As always, if having severe symptoms, such as fever, chills, chest pain, shortness of breath, please seek medical attention at emergency room.  Should you have any questions or concerns please call the internal medicine clinic at [phone].

## 2019-10-29 NOTE — Progress Notes (Signed)
Internal Medicine Clinic Attending  Case discussed with Dr. Masoudi at the time of the visit.  We reviewed the resident's history and exam and pertinent patient test results.  I agree with the assessment, diagnosis, and plan of care documented in the resident's note.  Daris Aristizabal, M.D., Ph.D.  

## 2019-11-03 ENCOUNTER — Ambulatory Visit: Payer: Medicaid Other | Admitting: Orthopaedic Surgery

## 2019-11-10 ENCOUNTER — Other Ambulatory Visit: Payer: Self-pay | Admitting: Internal Medicine

## 2019-11-10 DIAGNOSIS — N644 Mastodynia: Secondary | ICD-10-CM

## 2019-11-11 ENCOUNTER — Other Ambulatory Visit: Payer: Self-pay | Admitting: Internal Medicine

## 2019-11-24 ENCOUNTER — Ambulatory Visit (INDEPENDENT_AMBULATORY_CARE_PROVIDER_SITE_OTHER): Payer: Medicaid Other | Admitting: Orthopaedic Surgery

## 2019-11-24 ENCOUNTER — Encounter: Payer: Self-pay | Admitting: Orthopaedic Surgery

## 2019-11-24 VITALS — Ht 63.0 in | Wt 153.0 lb

## 2019-11-24 DIAGNOSIS — G8929 Other chronic pain: Secondary | ICD-10-CM | POA: Diagnosis not present

## 2019-11-24 DIAGNOSIS — M545 Low back pain, unspecified: Secondary | ICD-10-CM | POA: Insufficient documentation

## 2019-11-24 DIAGNOSIS — G894 Chronic pain syndrome: Secondary | ICD-10-CM

## 2019-11-24 NOTE — Progress Notes (Signed)
Office Visit Note   Patient: Jean [name]           Date of Birth: 08/02/1965           MRN: [id number] Visit Date: 11/24/2019              Requested by: Jose Persia, MD [address] [place],  [place] [address] PCP: Jose Persia, MD   Assessment & Plan: Visit Diagnoses:  1. Chronic pain syndrome   2. Chronic low back pain without sciatica, unspecified back pain laterality     Plan: Handicap placard given patient's request x5 years.  Continue ambulation.  We discussed option treatments for back problems including lumbar fusion versus decompression.  At present she does not have moderate or moderate to severe compression.  No instability.  Return as needed.  Follow-Up Instructions: No follow-ups on file.   Orders:  No orders of the defined types were placed in this encounter.  No orders of the defined types were placed in this encounter.     Procedures: No procedures performed   Clinical Data: No additional findings.   Subjective: Chief Complaint  Patient presents with  . Lower Back - Pain, Follow-up    MRI Lumbar review    HPI patient returns with ongoing problems with back pain she gets a hitch or catch in the right side particular when she bends or twists.  She is using a Rollator walker.  Previous left total hip arthroplasty 2017 she has been using the rolling walker since 2018.  Lumbar MRI scan was done on 10/24/2019 is available for review and this shows some minimal anterolisthesis at L5-S1 and facet edema at L4-5 with only mild central narrowing due to congenitally short pedicles and some facet and ligamentous hypertrophy.  Patient has lung problems or COPD.  Review of Systems updated noncontributory from last office visit as pertains HPI.   Objective: Vital Signs: Ht 5\' 3"  (1.6 m)   Wt 153 lb (69.4 kg)   LMP  (LMP Unknown)   BMI 27.10 kg/m   Physical Exam Constitutional:      Appearance: She is well-developed.  HENT:     Head:  Normocephalic.     Right Ear: External ear normal.     Left Ear: External ear normal.  Eyes:     Pupils: Pupils are equal, round, and reactive to light.  Neck:     Thyroid: No thyromegaly.     Trachea: No tracheal deviation.  Cardiovascular:     Rate and Rhythm: Normal rate.  Pulmonary:     Effort: Pulmonary effort is normal.  Abdominal:     Palpations: Abdomen is soft.  Skin:    General: Skin is warm and dry.  Neurological:     Mental Status: She is alert and oriented to person, place, and time.  Psychiatric:        Behavior: Behavior normal.     Ortho Exam negative straight leg raising 9 degrees right and left negative popliteal compression.  Negative logroll right hip.  No pain with logroll left hip.  Patient is ambulating with hip flexed position and with knees flexed.  Knee and ankle jerk are 2+ and symmetrical.  Specialty Comments:  No specialty comments available.  Imaging: No results found.   PMFS History: Patient Active Problem List   Diagnosis Date Noted  . Low back pain 11/24/2019  . Impingement syndrome of right shoulder 04/22/2019  . History of dental surgery 02/05/2019  .  COPD GOLD 0 ? AB  12/06/2018  . 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 GERD (gastroesophageal reflux disease)   . Breast pain 06/23/2018  . Dyspareunia in female 09/13/2017  . Cervicalgia 04/24/2017  . CKD (chronic kidney disease) 10/29/2016  . Chronic pain syndrome 05/10/2016  . MDD (major depressive disorder) 10/19/2015  . Dyspnea on exertion 08/22/2015  . HTN (hypertension) 07/08/2015  . Routine health maintenance 07/08/2015  . Tobacco abuse 07/08/2015   Past Medical History:  Diagnosis Date  . Adnexal mass 2019   likely remnant fibroid on broad ligament; getting diagnostic lap  . Anxiety   . Arthritis    hip  . Asthma   . Carpal tunnel syndrome    bilateral  . Chest wall pain 10/15/2016  . Chronic lower back pain   . Depression   . Dyspnea     with exertion  . GERD (gastroesophageal reflux disease)   . Headache    migraines  . Herpes   . Hypertension   . Liver lesion, right lobe 12/01/2015   CT chest without contrast 11/30/15 with hepatic lesions measuring 2.0 x 1.7 cm of the right lobe and 1 x 1 cm on the dome. CT abd/pel 8/18: 1.3cm inferior R liver lobe, prob benign but recommend 3-9mo f/u with MRI w/wo IV contrast with attenuation MRI 6/19: combination of benign cavernous hemangiomas and small benign cysts  . Poor dental hygiene    chipped upper front and several loose teeth  . Substance abuse ([place])    cocaine/crack cocaine - last use 02/07/18  . SVD (spontaneous vaginal delivery)    x 1  . Uses walker    for ambulation  . Uterine leiomyoma 02/16/2016   s/p vaginal hysterectomy 2018  . Vaginal discharge 03/26/2018    Family History  Problem Relation Age of Onset  . Cancer Mother   . Hypertension Mother   . Diabetes Mother   . Breast cancer Mother        diagnosed in her 58's  . Stroke Father     Past Surgical History:  Procedure Laterality Date  . LAPAROSCOPIC OVARIAN CYSTECTOMY Right 02/25/2018   Procedure: LAPAROSCOPIC REMOVAL OF RIGHT ADNEXAL MASS;  Surgeon: Chancy Milroy, MD;  Location: [facility];  Service: Gynecology;  Laterality: Right;  . SALPINGECTOMY Left    lap - ectopic preg  . TOTAL HIP ARTHROPLASTY Left 01/20/2016   Procedure: LEFT TOTAL HIP ARTHROPLASTY ANTERIOR APPROACH;  Surgeon: Mcarthur Rossetti, MD;  Location: [facility];  Service: Orthopedics;  Laterality: Left;  . TUBAL LIGATION    . VAGINAL HYSTERECTOMY Right 12/11/2016   Procedure: HYSTERECTOMY VAGINAL WITH RIGHT SALPINGECTOMY;  Surgeon: Chancy Milroy, MD;  Location: [facility];  Service: Gynecology;  Laterality: Right;   Social History   Occupational History  . Not on file  Tobacco Use  . Smoking status: Former Smoker    Packs/day: 0.50    Years: 29.00    Pack years: 14.50    Types: Cigarettes    Quit date: 11/25/2018    Years  since quitting: 0.9  . Smokeless tobacco: Never Used  Vaping Use  . Vaping Use: Former  Substance and Sexual Activity  . Alcohol use: Yes    Alcohol/week: 3.0 standard drinks    Types: 3 Cans of beer per week    Comment: Occasional Beer   . Drug use: No    Types: "Crack" cocaine, Cocaine    Comment: last  6 months ago.  Marland Kitchen Sexual activity: Yes    Partners: Male    Birth control/protection: None, Post-menopausal    Comment: Hysterectomy

## 2019-11-25 ENCOUNTER — Encounter: Payer: Medicaid Other | Admitting: Internal Medicine

## 2019-11-25 ENCOUNTER — Telehealth: Payer: Self-pay

## 2019-11-26 ENCOUNTER — Other Ambulatory Visit: Payer: Self-pay

## 2019-11-26 ENCOUNTER — Ambulatory Visit
Admission: RE | Admit: 2019-11-26 | Discharge: 2019-11-26 | Disposition: A | Discharge status: Home / Self Care | Payer: Medicaid Other | Source: Ambulatory Visit | Attending: Internal Medicine | Admitting: Internal Medicine

## 2019-11-26 ENCOUNTER — Ambulatory Visit: Payer: Medicaid Other

## 2019-11-26 DIAGNOSIS — N644 Mastodynia: Secondary | ICD-10-CM

## 2019-12-11 ENCOUNTER — Other Ambulatory Visit: Payer: Self-pay | Admitting: Internal Medicine

## 2019-12-11 DIAGNOSIS — I10 Essential (primary) hypertension: Secondary | ICD-10-CM

## 2020-02-10 ENCOUNTER — Other Ambulatory Visit: Payer: Self-pay | Admitting: Internal Medicine

## 2020-02-10 ENCOUNTER — Telehealth: Payer: Self-pay | Admitting: Student

## 2020-02-10 DIAGNOSIS — I10 Essential (primary) hypertension: Secondary | ICD-10-CM

## 2020-02-10 NOTE — Telephone Encounter (Signed)
Called and left a message that patient's amlodipine has been ordered to her pharmacy.  Jeralyn Bennett, MD 02/10/2020, 9:09 AM Pager: [phone]

## 2020-02-11 ENCOUNTER — Other Ambulatory Visit: Payer: Self-pay | Admitting: Internal Medicine

## 2020-02-11 DIAGNOSIS — M25559 Pain in unspecified hip: Secondary | ICD-10-CM

## 2020-02-12 ENCOUNTER — Other Ambulatory Visit: Payer: Self-pay | Admitting: Internal Medicine

## 2020-02-12 ENCOUNTER — Other Ambulatory Visit: Payer: Self-pay | Admitting: Student in an Organized Health Care Education/Training Program

## 2020-02-12 DIAGNOSIS — I10 Essential (primary) hypertension: Secondary | ICD-10-CM

## 2020-02-12 DIAGNOSIS — R0609 Other forms of dyspnea: Secondary | ICD-10-CM

## 2020-02-15 ENCOUNTER — Other Ambulatory Visit: Payer: Self-pay | Admitting: Internal Medicine

## 2020-02-15 DIAGNOSIS — I10 Essential (primary) hypertension: Secondary | ICD-10-CM

## 2020-02-16 ENCOUNTER — Other Ambulatory Visit: Payer: Self-pay

## 2020-02-16 ENCOUNTER — Emergency Department (HOSPITAL_COMMUNITY): Payer: Medicaid Other

## 2020-02-16 ENCOUNTER — Telehealth: Payer: Self-pay

## 2020-02-16 ENCOUNTER — Emergency Department (HOSPITAL_COMMUNITY)
Admission: EM | Admit: 2020-02-16 | Discharge: 2020-02-16 | Disposition: A | Discharge status: Home / Self Care | Payer: Medicaid Other | Attending: Emergency Medicine | Admitting: Emergency Medicine

## 2020-02-16 DIAGNOSIS — R0609 Other forms of dyspnea: Secondary | ICD-10-CM

## 2020-02-16 DIAGNOSIS — Z96642 Presence of left artificial hip joint: Secondary | ICD-10-CM | POA: Insufficient documentation

## 2020-02-16 DIAGNOSIS — M25552 Pain in left hip: Secondary | ICD-10-CM | POA: Insufficient documentation

## 2020-02-16 DIAGNOSIS — R079 Chest pain, unspecified: Secondary | ICD-10-CM | POA: Diagnosis present

## 2020-02-16 DIAGNOSIS — M542 Cervicalgia: Secondary | ICD-10-CM | POA: Diagnosis not present

## 2020-02-16 DIAGNOSIS — N189 Chronic kidney disease, unspecified: Secondary | ICD-10-CM | POA: Diagnosis not present

## 2020-02-16 DIAGNOSIS — M549 Dorsalgia, unspecified: Secondary | ICD-10-CM | POA: Insufficient documentation

## 2020-02-16 DIAGNOSIS — Z87891 Personal history of nicotine dependence: Secondary | ICD-10-CM | POA: Diagnosis not present

## 2020-02-16 DIAGNOSIS — I129 Hypertensive chronic kidney disease with stage 1 through stage 4 chronic kidney disease, or unspecified chronic kidney disease: Secondary | ICD-10-CM | POA: Insufficient documentation

## 2020-02-16 DIAGNOSIS — M25512 Pain in left shoulder: Secondary | ICD-10-CM | POA: Insufficient documentation

## 2020-02-16 DIAGNOSIS — R0602 Shortness of breath: Secondary | ICD-10-CM | POA: Diagnosis not present

## 2020-02-16 DIAGNOSIS — J449 Chronic obstructive pulmonary disease, unspecified: Secondary | ICD-10-CM | POA: Diagnosis not present

## 2020-02-16 DIAGNOSIS — Z7951 Long term (current) use of inhaled steroids: Secondary | ICD-10-CM | POA: Insufficient documentation

## 2020-02-16 DIAGNOSIS — Z79899 Other long term (current) drug therapy: Secondary | ICD-10-CM | POA: Diagnosis not present

## 2020-02-16 DIAGNOSIS — G8929 Other chronic pain: Secondary | ICD-10-CM | POA: Diagnosis not present

## 2020-02-16 LAB — BASIC METABOLIC PANEL
Anion gap: 11 (ref 5–15)
BUN: 16 mg/dL (ref 6–20)
CO2: 23 mmol/L (ref 22–32)
Calcium: 10.2 mg/dL (ref 8.9–10.3)
Chloride: 105 mmol/L (ref 98–111)
Creatinine, Ser: 1.33 mg/dL — ABNORMAL HIGH (ref 0.44–1.00)
GFR, Estimated: 45 mL/min — ABNORMAL LOW (ref >60)
Glucose, Bld: 63 mg/dL — ABNORMAL LOW (ref 70–99)
Potassium: 4 mmol/L (ref 3.5–5.1)
Sodium: 139 mmol/L (ref 135–145)

## 2020-02-16 LAB — CBC
HCT: 43.2 % (ref 36.0–46.0)
Hemoglobin: 13.4 g/dL (ref 12.0–15.0)
MCH: 31.1 pg (ref 26.0–34.0)
MCHC: 31 g/dL (ref 30.0–36.0)
MCV: 100.2 fL — ABNORMAL HIGH (ref 80.0–100.0)
Platelets: 424 10*3/uL — ABNORMAL HIGH (ref 150–400)
RBC: 4.31 MIL/uL (ref 3.87–5.11)
RDW: 13 % (ref 11.5–15.5)
WBC: 5.9 10*3/uL (ref 4.0–10.5)
nRBC: 0 % (ref 0.0–0.2)

## 2020-02-16 LAB — CBG MONITORING, ED: Glucose-Capillary: 86 mg/dL (ref 70–99)

## 2020-02-16 LAB — TROPONIN I (HIGH SENSITIVITY)
Troponin I (High Sensitivity): 7 ng/L (ref <18)
Troponin I (High Sensitivity): 7 ng/L (ref <18)

## 2020-02-16 MED ORDER — KETOROLAC TROMETHAMINE 30 MG/ML IJ SOLN
30.0000 mg | Freq: Once | INTRAMUSCULAR | Status: AC
  Administered 2020-02-16: 30 mg via INTRAVENOUS
  Filled 2020-02-16: qty 1

## 2020-02-16 MED ORDER — ALBUTEROL SULFATE HFA 108 (90 BASE) MCG/ACT IN AERS: INHALATION_SPRAY | RESPIRATORY_TRACT | 1 refills | Status: DC

## 2020-02-16 NOTE — ED Provider Notes (Signed)
[facility] EMERGENCY DEPARTMENT Provider Note   CSN: [id number] Arrival date & time: 02/16/20  1637     History Chief Complaint  Patient presents with  . Arm Pain  . Chest Pain    Jean [name] is a 54 y.o. female.  She has chronic pain in her back left hip and left shoulder.  She says her left neck and shoulder pain has gotten worse and now radiates into her chest.  Been going on for a few days.  She says they do not give her any pain medicine so she uses cocaine to help cope with the pain.  No trauma.  She said she feels short of breath intermittently.  No fevers or chills.  No cough.  She said she had injections in her neck 6 months ago but none since then. The history is provided by the patient.  Arm Pain This is a chronic problem. The current episode started more than 2 days ago. The problem occurs constantly. The problem has not changed since onset.Associated symptoms include chest pain and shortness of breath. Pertinent negatives include no abdominal pain and no headaches. The symptoms are aggravated by bending and twisting. Nothing relieves the symptoms. She has tried nothing for the symptoms. The treatment provided no relief.  Chest Pain Pain location:  L chest Pain quality: stabbing   Pain radiates to:  Neck Pain severity:  Moderate Onset quality:  Gradual Duration:  3 days Timing:  Intermittent Progression:  Unchanged Chronicity:  New Context: movement and raising an arm   Relieved by:  Nothing Worsened by:  Movement Ineffective treatments:  None tried Associated symptoms: back pain and shortness of breath   Associated symptoms: no abdominal pain, no cough, no fever, no headache, no nausea and no vomiting        Past Medical History:  Diagnosis Date  . Adnexal mass 2019   likely remnant fibroid on broad ligament; getting diagnostic lap  . Anxiety   . Arthritis    hip  . Asthma   . Carpal tunnel syndrome    bilateral  . Chest wall pain  10/15/2016  . Chronic lower back pain   . Depression   . Dyspnea    with exertion  . GERD (gastroesophageal reflux disease)   . Headache    migraines  . Herpes   . Hypertension   . Liver lesion, right lobe 12/01/2015   CT chest without contrast 11/30/15 with hepatic lesions measuring 2.0 x 1.7 cm of the right lobe and 1 x 1 cm on the dome. CT abd/pel 8/18: 1.3cm inferior R liver lobe, prob benign but recommend 3-72mo f/u with MRI w/wo IV contrast with attenuation MRI 6/19: combination of benign cavernous hemangiomas and small benign cysts  . Poor dental hygiene    chipped upper front and several loose teeth  . Substance abuse ([place])    cocaine/crack cocaine - last use 02/07/18  . SVD (spontaneous vaginal delivery)    x 1  . Uses walker    for ambulation  . Uterine leiomyoma 02/16/2016   s/p vaginal hysterectomy 2018  . Vaginal discharge 03/26/2018    Patient Active Problem List   Diagnosis Date Noted  . Low back pain 11/24/2019  . Impingement syndrome of right shoulder 04/22/2019  . History of dental surgery 02/05/2019  . COPD GOLD 0 ? AB  12/06/2018  . 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 GERD (gastroesophageal reflux disease)   .  Breast pain 06/23/2018  . Dyspareunia in female 09/13/2017  . Cervicalgia 04/24/2017  . CKD (chronic kidney disease) 10/29/2016  . Chronic pain syndrome 05/10/2016  . MDD (major depressive disorder) 10/19/2015  . Dyspnea on exertion 08/22/2015  . HTN (hypertension) 07/08/2015  . Routine health maintenance 07/08/2015  . Tobacco abuse 07/08/2015    Past Surgical History:  Procedure Laterality Date  . LAPAROSCOPIC OVARIAN CYSTECTOMY Right 02/25/2018   Procedure: LAPAROSCOPIC REMOVAL OF RIGHT ADNEXAL MASS;  Surgeon: Chancy Milroy, MD;  Location: [facility];  Service: Gynecology;  Laterality: Right;  . SALPINGECTOMY Left    lap - ectopic preg  . TOTAL HIP ARTHROPLASTY Left 01/20/2016   Procedure: LEFT TOTAL HIP  ARTHROPLASTY ANTERIOR APPROACH;  Surgeon: Mcarthur Rossetti, MD;  Location: [facility];  Service: Orthopedics;  Laterality: Left;  . TUBAL LIGATION    . VAGINAL HYSTERECTOMY Right 12/11/2016   Procedure: HYSTERECTOMY VAGINAL WITH RIGHT SALPINGECTOMY;  Surgeon: Chancy Milroy, MD;  Location: [facility];  Service: Gynecology;  Laterality: Right;     OB History    Gravida  3   Para  1   Term  1   Preterm      AB  2   Living  1     SAB      TAB  1   Ectopic  1   Multiple      Live Births              Family History  Problem Relation Age of Onset  . Cancer Mother   . Hypertension Mother   . Diabetes Mother   . Breast cancer Mother        diagnosed in her 59's  . Stroke Father   . Breast cancer Cousin     Social History   Tobacco Use  . Smoking status: Former Smoker    Packs/day: 0.50    Years: 29.00    Pack years: 14.50    Types: Cigarettes    Quit date: 11/25/2018    Years since quitting: 1.2  . Smokeless tobacco: Never Used  Vaping Use  . Vaping Use: Former  Substance Use Topics  . Alcohol use: Yes    Alcohol/week: 3.0 standard drinks    Types: 3 Cans of beer per week    Comment: Occasional Beer   . Drug use: No    Types: "Crack" cocaine, Cocaine    Comment: last 6 months ago.    Home Medications Prior to Admission medications   Medication Sig Start Date End Date Taking? Authorizing Provider  acetaminophen-codeine (TYLENOL #3) 300-30 MG tablet Take 1 tablet by mouth 2 (two) times daily as needed for moderate pain. 04/20/19   Marybelle Killings, MD  albuterol (VENTOLIN HFA) 108 (90 Base) MCG/ACT inhaler INHALE 2 PUFFS BY MOUTH EVERY 6 HOURS AS NEEDED FOR COUGHING, WHEEZING, OR SHORTNESS OF BREATH 02/16/20   Harvie Heck, MD  amLODipine (NORVASC) 10 MG tablet TAKE 1 TABLET BY MOUTH EVERY DAY 02/10/20   Jeralyn Bennett, MD  cyclobenzaprine (FLEXERIL) 10 MG tablet TAKE 1 TABLET BY MOUTH TWICE A DAY AS NEEDED FOR MUSCLE SPASMS 02/11/20   Harvie Heck, MD    divalproex (DEPAKOTE ER) 250 MG 24 hr tablet Take 3 tablets (750 mg total) by mouth daily. 08/26/18   Garvin Fila, MD  gabapentin (NEURONTIN) 300 MG capsule Take 1 capsule (300 mg total) by mouth at bedtime. 04/08/19   Jose Persia, MD  hydrochlorothiazide (HYDRODIURIL) 25 MG  tablet TAKE 1 TABLET BY MOUTH EVERY DAY 02/12/20   Mosetta Anis, MD  losartan (COZAAR) 25 MG tablet TAKE 2 TABLETS BY MOUTH EVERY DAY 12/11/19   Jose Persia, MD  methylPREDNISolone (MEDROL) 4 MG tablet 6-day taper to be taken as directed 09/10/19   Lanae Crumbly, PA-C  nicotine (NICODERM CQ - DOSED IN MG/24 HR) 7 mg/24hr patch Place 7 mg onto the skin daily.    [provider]  pantoprazole (PROTONIX) 40 MG tablet Take 30- 60 min before your first and last meals of the day 04/08/19   Jose Persia, MD  sertraline (ZOLOFT) 100 MG tablet Take 1 tablet (100 mg total) by mouth daily. 04/08/19   Jose Persia, MD  SYMBICORT 80-4.5 MCG/ACT inhaler TAKE 2 PUFFS BY MOUTH TWICE A DAY 11/12/19   Harvie Heck, MD  tetrahydrozoline 0.05 % ophthalmic solution Place 1 drop into both eyes daily as needed (for dry eyes).     [provider]    Allergies    Lisinopril  Review of Systems   Review of Systems  Constitutional: Negative for fever.  HENT: Negative for sore throat.   Eyes: Negative for visual disturbance.  Respiratory: Positive for shortness of breath. Negative for cough.   Cardiovascular: Positive for chest pain.  Gastrointestinal: Negative for abdominal pain, nausea and vomiting.  Genitourinary: Negative for dysuria.  Musculoskeletal: Positive for back pain and neck pain.  Skin: Negative for rash.  Neurological: Negative for headaches.    Physical Exam Updated Vital Signs BP 133/90 (BP Location: Right Arm)   Pulse (!) 59   Temp 98.6 F (37 C) (Oral)   Resp 16   LMP  (LMP Unknown)   SpO2 99%   Physical Exam Vitals and nursing note reviewed.  Constitutional:      General: She is not  in acute distress.    Appearance: Normal appearance. She is well-developed.  HENT:     Head: Normocephalic and atraumatic.  Eyes:     Conjunctiva/sclera: Conjunctivae normal.  Cardiovascular:     Rate and Rhythm: Normal rate and regular rhythm.     Heart sounds: Normal heart sounds. No murmur heard.   Pulmonary:     Effort: Pulmonary effort is normal. No respiratory distress.     Breath sounds: Normal breath sounds.  Chest:     Chest wall: Tenderness present.     Comments: She has tenderness left upper chest into her left shoulder and the left side of her neck.  She has pain with range of motion of her left shoulder. Abdominal:     Palpations: Abdomen is soft.     Tenderness: There is no abdominal tenderness.  Musculoskeletal:        General: Tenderness present. No deformity or signs of injury.     Cervical back: Neck supple.     Right lower leg: No tenderness.     Left lower leg: No tenderness.  Skin:    General: Skin is warm and dry.     Capillary Refill: Capillary refill takes less than 2 seconds.  Neurological:     General: No focal deficit present.     Mental Status: She is alert.     ED Results / Procedures / Treatments   Labs (all labs ordered are listed, but only abnormal results are displayed) Labs Reviewed  BASIC METABOLIC PANEL - Abnormal; Notable for the following components:      Result Value   Glucose, Bld 63 (*)  Creatinine, Ser 1.33 (*)    GFR, Estimated 45 (*)    All other components within normal limits  CBC - Abnormal; Notable for the following components:   MCV 100.2 (*)    Platelets 424 (*)    All other components within normal limits  CBG MONITORING, ED  TROPONIN I (HIGH SENSITIVITY)  TROPONIN I (HIGH SENSITIVITY)    EKG EKG Interpretation  Date/Time:  Tuesday February 16 2020 16:43:00 EDT Ventricular Rate:  60 PR Interval:  146 QRS Duration: 82 QT Interval:  402 QTC Calculation: 402 R Axis:   -25 Text Interpretation: Normal sinus  rhythm Left ventricular hypertrophy with repolarization abnormality ( R in aVL , Cornell product ) Cannot rule out Septal infarct , age undetermined Abnormal ECG No significant change since prior 7/19 Confirmed by Aletta Edouard [phone]) on 02/16/2020 7:46:46 PM   Radiology DG Chest 2 View  Result Date: 02/16/2020 CLINICAL DATA:  54 year old female with chest pain. EXAM: CHEST - 2 VIEW COMPARISON:  Chest radiograph dated 10/29/2018. FINDINGS: The heart size and mediastinal contours are within normal limits. Both lungs are clear. The visualized skeletal structures are unremarkable. IMPRESSION: No active cardiopulmonary disease. Electronically Signed   By: Anner Crete M.D.   On: 02/16/2020 17:13    Procedures Procedures (including critical care time)  Medications Ordered in ED Medications - No data to display  ED Course  I have reviewed the triage vital signs and the nursing notes.  Pertinent labs & imaging results that were available during my care of the patient were reviewed by me and considered in my medical decision making (see chart for details).  Clinical Course as of Feb 17 1048  Tue Feb 16, 2020  2037 MRI done 12/20 showing foraminal narrowing C7-T1 on the left.   [MB]  2205 Reviewed with patient her results.  She is comfortable with plan for outpatient follow-up with her doctors.  Return instructions discussed.   [MB]    Clinical Course User Index [MB] Hayden Rasmussen, MD   MDM Rules/Calculators/A&P                         This patient complains of left upper chest left shoulder left arm pain; this involves an extensive number of treatment Options and is a complaint that carries with it a high risk of complications and Morbidity. The differential includes musculoskeletal pain, radiculopathy, ACS, pneumonia, pneumothorax, PE  I ordered, reviewed and interpreted labs, which included CBC with normal white count normal hemoglobin, chemistry normal other than low  glucose which was repeated had normalized and also elevated creatinine which is stable for patient.  Delta troponin unchanged. I ordered medication Toradol with improvement in her symptoms I ordered imaging studies which included chest x-ray and left shoulder x-ray and I independently    visualized and interpreted imaging which showed some degenerative changes in the shoulder Previous records obtained and reviewed in epic, follows with orthopedics for neck shoulder and back pain  After the interventions stated above, I reevaluated the patient and found patient to be hemodynamically stable.  Satting 99% on room air.  Reviewed results with her and she is comfortable plan with following up with her PCP.  Return instructions discussed.  Final Clinical Impression(s) / ED Diagnoses Final diagnoses:  Nonspecific chest pain  Left shoulder pain, unspecified chronicity    Rx / DC Orders ED Discharge Orders    None       Aletta Edouard  C, MD 02/17/20 1052

## 2020-02-16 NOTE — Telephone Encounter (Signed)
Agree w/ evaluation at ED. Thank you.

## 2020-02-16 NOTE — Telephone Encounter (Signed)
Received TC from patient who c/o chest tightness with left neck and left arm pain.  States that her symptoms were intermittent X last 2 weeks, but yesterday symptoms became more intense and more regular.  She also c/o SOB and SOB was noted during phone conversation w/ RN.  She was instructed to go to the ED now for evaluation and call 911.  She states she has someone taking her to the ED now. SChaplin, RN,BSN

## 2020-02-16 NOTE — Discharge Instructions (Addendum)
You were seen in the emergency department for worsening left shoulder pain and left-sided chest pain.  You had blood work EKG and a chest x-ray that did not show any obvious findings.  Your shoulder x-ray did show some arthritis changes.  Please try warm compress to the area and continue some anti-inflammatories such as ibuprofen or Aleve.  Follow-up with your doctor.  Return to the emergency department for any worsening or concerning symptoms.

## 2020-02-16 NOTE — ED Triage Notes (Addendum)
Pt bib ems with L arm pain. Pt with hx of same. Pt states hx of "nerve issues" with that arm. Pt with decreased ROM to that arm at baseline. States increased pain X3 days. Pt used cocaine to help ease pain. Pt now also states she is having intermittent CP and neck pain for the last several days as well.  200/100 HR 64  98% RA

## 2020-02-16 NOTE — Telephone Encounter (Signed)
albuterol (VENTOLIN HFA) 108 (90 Base) MCG/ACT inhaler, REFILL REQUEST @  [id number] [place], [place] - [address] Phone:  [phone]  Fax:  [phone]

## 2020-02-17 ENCOUNTER — Telehealth: Payer: Self-pay | Admitting: Physical Medicine and Rehabilitation

## 2020-02-17 NOTE — Telephone Encounter (Signed)
Patient called needing to schedule an appointment for an injection in her neck and spine. The number to contact patient is [phone]

## 2020-02-17 NOTE — Telephone Encounter (Signed)
Patient's last C7-T1 IL was 03/03/2018. She had an MRI in 2020 and saw Dr. Lorin Mercy on 04/22/19 for a review of that. Please advise.

## 2020-02-18 NOTE — Telephone Encounter (Signed)
Called patient to advise. She does have an appointment with Dr. Lorin Mercy on 10/20.

## 2020-02-18 NOTE — Telephone Encounter (Signed)
F/up Dr. Lorin Mercy.  [place] cervical injection in 07/2018, I did EMG/NCS in 11/2018 which was normal.

## 2020-02-24 ENCOUNTER — Ambulatory Visit: Payer: Self-pay

## 2020-02-24 ENCOUNTER — Ambulatory Visit (INDEPENDENT_AMBULATORY_CARE_PROVIDER_SITE_OTHER): Payer: Medicaid Other | Admitting: Orthopaedic Surgery

## 2020-02-24 ENCOUNTER — Other Ambulatory Visit: Payer: Self-pay

## 2020-02-24 ENCOUNTER — Encounter: Payer: Self-pay | Admitting: Orthopaedic Surgery

## 2020-02-24 VITALS — BP 110/71 | HR 71 | Ht 64.0 in | Wt 153.0 lb

## 2020-02-24 DIAGNOSIS — M542 Cervicalgia: Secondary | ICD-10-CM

## 2020-02-24 DIAGNOSIS — G8929 Other chronic pain: Secondary | ICD-10-CM

## 2020-02-24 DIAGNOSIS — M545 Low back pain, unspecified: Secondary | ICD-10-CM

## 2020-02-24 NOTE — Progress Notes (Signed)
Office Visit Note   Patient: Jean [name]           Date of Birth: 05-Feb-1966           MRN: [id number] Visit Date: 02/24/2020              Requested by: Jose Persia, MD [address] [place],  [place] [address] PCP: Jose Persia, MD   Assessment & Plan: Visit Diagnoses:  1. Neck pain   2. Chronic low back pain without sciatica, unspecified back pain laterality     Plan: We discussed she needs to stop smoking crack go back on the exercises taught to her by therapy work from using a Rollator to walk and progressed to a cane and gradually get her lower extremities and core stronger.  She does have some foraminal stenosis on cervical MRI scan upper cervical region but does not require operative intervention.  She has some mild narrowing at L4-5 but no severe stenosis.  She is to call her PCP about restarting her antidepressant medication.  We discussed problems with withdrawal from smoking crack.  She can follow-up with me as needed.  She asked about narcotic medication usage and I discussed with her that this is not a good idea.  Follow-Up Instructions: No follow-ups on file.   Orders:  Orders Placed This Encounter  Procedures  . XR Cervical Spine 2 or 3 views   No orders of the defined types were placed in this encounter.     Procedures: No procedures performed   Clinical Data: No additional findings.   Subjective: Chief Complaint  Patient presents with  . Neck - Pain  . Left Shoulder - Pain  . Lower Back - Pain    HPI 54 year old female returns with ongoing chronic low back pain and neck pain.  She had an MRI 2020 cervical spine and MRI scan of her lumbar spine which is available for review.  She states she was unable to get her antidepressant medication Zoloft so she has been smoking crack instead and states she knows that this is not really a good thing for her.  She has done some therapy in the past for back and knows the exercises but states  at the time she was not taking her blood pressure medication and have problems with hypertension not controlled which terminated her therapy.  She is used Tylenol and ibuprofen.  She did not get relief from this.  Gabapentin did not give her relief.  Today cervical spine and lumbar MRI scans are reviewed.  Review of Systems   Objective: Vital Signs: BP 110/71   Pulse 71   Ht 5\' 4"  (1.626 m)   Wt 153 lb (69.4 kg)   LMP  (LMP Unknown)   BMI 26.26 kg/m   Physical Exam Constitutional:      Appearance: She is well-developed.  HENT:     Head: Normocephalic.     Right Ear: External ear normal.     Left Ear: External ear normal.  Eyes:     Pupils: Pupils are equal, round, and reactive to light.  Neck:     Thyroid: No thyromegaly.     Trachea: No tracheal deviation.  Cardiovascular:     Rate and Rhythm: Normal rate.  Pulmonary:     Effort: Pulmonary effort is normal.  Abdominal:     Palpations: Abdomen is soft.  Skin:    General: Skin is warm and dry.  Neurological:  Mental Status: She is alert and oriented to person, place, and time.  Psychiatric:        Behavior: Behavior normal.     Ortho Exam patient can ambulate without a rolling walker.  At times she has some jumpiness but today is walking without knees flexed and hips flexed she did in July.  Reflexes are 2+ and symmetrical negative logroll the hips.  She has global decreased strength quads hamstrings anterior tib EHL without sensory deficit.  Specialty Comments:  No specialty comments available.  Imaging: No results found.   PMFS History: Patient Active Problem List   Diagnosis Date Noted  . Low back pain 11/24/2019  . Impingement syndrome of right shoulder 04/22/2019  . History of dental surgery 02/05/2019  . COPD GOLD 0 ? AB  12/06/2018  . 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 GERD (gastroesophageal reflux disease)   . Breast pain 06/23/2018  . Dyspareunia in female  09/13/2017  . Cervicalgia 04/24/2017  . CKD (chronic kidney disease) 10/29/2016  . Chronic pain syndrome 05/10/2016  . MDD (major depressive disorder) 10/19/2015  . Dyspnea on exertion 08/22/2015  . HTN (hypertension) 07/08/2015  . Routine health maintenance 07/08/2015  . Tobacco abuse 07/08/2015   Past Medical History:  Diagnosis Date  . Adnexal mass 2019   likely remnant fibroid on broad ligament; getting diagnostic lap  . Anxiety   . Arthritis    hip  . Asthma   . Carpal tunnel syndrome    bilateral  . Chest wall pain 10/15/2016  . Chronic lower back pain   . Depression   . Dyspnea    with exertion  . GERD (gastroesophageal reflux disease)   . Headache    migraines  . Herpes   . Hypertension   . Liver lesion, right lobe 12/01/2015   CT chest without contrast 11/30/15 with hepatic lesions measuring 2.0 x 1.7 cm of the right lobe and 1 x 1 cm on the dome. CT abd/pel 8/18: 1.3cm inferior R liver lobe, prob benign but recommend 3-30mo f/u with MRI w/wo IV contrast with attenuation MRI 6/19: combination of benign cavernous hemangiomas and small benign cysts  . Poor dental hygiene    chipped upper front and several loose teeth  . Substance abuse ([place])    cocaine/crack cocaine - last use 02/07/18  . SVD (spontaneous vaginal delivery)    x 1  . Uses walker    for ambulation  . Uterine leiomyoma 02/16/2016   s/p vaginal hysterectomy 2018  . Vaginal discharge 03/26/2018    Family History  Problem Relation Age of Onset  . Cancer Mother   . Hypertension Mother   . Diabetes Mother   . Breast cancer Mother        diagnosed in her 73's  . Stroke Father   . Breast cancer Cousin     Past Surgical History:  Procedure Laterality Date  . LAPAROSCOPIC OVARIAN CYSTECTOMY Right 02/25/2018   Procedure: LAPAROSCOPIC REMOVAL OF RIGHT ADNEXAL MASS;  Surgeon: Chancy Milroy, MD;  Location: [facility];  Service: Gynecology;  Laterality: Right;  . SALPINGECTOMY Left    lap - ectopic preg  .  TOTAL HIP ARTHROPLASTY Left 01/20/2016   Procedure: LEFT TOTAL HIP ARTHROPLASTY ANTERIOR APPROACH;  Surgeon: Mcarthur Rossetti, MD;  Location: [facility];  Service: Orthopedics;  Laterality: Left;  . TUBAL LIGATION    . VAGINAL HYSTERECTOMY Right 12/11/2016   Procedure: HYSTERECTOMY VAGINAL WITH RIGHT SALPINGECTOMY;  Surgeon: Chancy Milroy, MD;  Location: [facility];  Service: Gynecology;  Laterality: Right;   Social History   Occupational History  . Not on file  Tobacco Use  . Smoking status: Former Smoker    Packs/day: 0.50    Years: 29.00    Pack years: 14.50    Types: Cigarettes    Quit date: 11/25/2018    Years since quitting: 1.2  . Smokeless tobacco: Never Used  Vaping Use  . Vaping Use: Former  Substance and Sexual Activity  . Alcohol use: Yes    Alcohol/week: 3.0 standard drinks    Types: 3 Cans of beer per week    Comment: Occasional Beer   . Drug use: No    Types: "Crack" cocaine, Cocaine    Comment: last 6 months ago.  Marland Kitchen Sexual activity: Yes    Partners: Male    Birth control/protection: None, Post-menopausal    Comment: Hysterectomy

## 2020-04-01 ENCOUNTER — Other Ambulatory Visit: Payer: Self-pay

## 2020-04-01 ENCOUNTER — Emergency Department (HOSPITAL_COMMUNITY)
Admission: EM | Admit: 2020-04-01 | Discharge: 2020-04-01 | Disposition: A | Discharge status: Home / Self Care | Payer: Medicaid Other | Attending: Emergency Medicine | Admitting: Emergency Medicine

## 2020-04-01 ENCOUNTER — Encounter (HOSPITAL_COMMUNITY): Payer: Self-pay | Admitting: Emergency Medicine

## 2020-04-01 DIAGNOSIS — I129 Hypertensive chronic kidney disease with stage 1 through stage 4 chronic kidney disease, or unspecified chronic kidney disease: Secondary | ICD-10-CM | POA: Insufficient documentation

## 2020-04-01 DIAGNOSIS — Z87891 Personal history of nicotine dependence: Secondary | ICD-10-CM | POA: Insufficient documentation

## 2020-04-01 DIAGNOSIS — G43009 Migraine without aura, not intractable, without status migrainosus: Secondary | ICD-10-CM | POA: Insufficient documentation

## 2020-04-01 DIAGNOSIS — J45909 Unspecified asthma, uncomplicated: Secondary | ICD-10-CM | POA: Insufficient documentation

## 2020-04-01 DIAGNOSIS — Z79899 Other long term (current) drug therapy: Secondary | ICD-10-CM | POA: Diagnosis not present

## 2020-04-01 DIAGNOSIS — N183 Chronic kidney disease, stage 3 unspecified: Secondary | ICD-10-CM | POA: Diagnosis not present

## 2020-04-01 DIAGNOSIS — R519 Headache, unspecified: Secondary | ICD-10-CM | POA: Diagnosis present

## 2020-04-01 LAB — CBC
HCT: 44.2 % (ref 36.0–46.0)
Hemoglobin: 14 g/dL (ref 12.0–15.0)
MCH: 31.3 pg (ref 26.0–34.0)
MCHC: 31.7 g/dL (ref 30.0–36.0)
MCV: 98.7 fL (ref 80.0–100.0)
Platelets: 398 10*3/uL (ref 150–400)
RBC: 4.48 MIL/uL (ref 3.87–5.11)
RDW: 13.6 % (ref 11.5–15.5)
WBC: 5.9 10*3/uL (ref 4.0–10.5)
nRBC: 0 % (ref 0.0–0.2)

## 2020-04-01 LAB — COMPREHENSIVE METABOLIC PANEL
ALT: 12 U/L (ref 0–44)
AST: 16 U/L (ref 15–41)
Albumin: 4.2 g/dL (ref 3.5–5.0)
Alkaline Phosphatase: 90 U/L (ref 38–126)
Anion gap: 12 (ref 5–15)
BUN: 21 mg/dL — ABNORMAL HIGH (ref 6–20)
CO2: 21 mmol/L — ABNORMAL LOW (ref 22–32)
Calcium: 10.1 mg/dL (ref 8.9–10.3)
Chloride: 104 mmol/L (ref 98–111)
Creatinine, Ser: 1.43 mg/dL — ABNORMAL HIGH (ref 0.44–1.00)
GFR, Estimated: 43 mL/min — ABNORMAL LOW (ref >60)
Glucose, Bld: 90 mg/dL (ref 70–99)
Potassium: 4.1 mmol/L (ref 3.5–5.1)
Sodium: 137 mmol/L (ref 135–145)
Total Bilirubin: 0.5 mg/dL (ref 0.3–1.2)
Total Protein: 8.7 g/dL — ABNORMAL HIGH (ref 6.5–8.1)

## 2020-04-01 MED ORDER — MAGNESIUM GLUCONATE 250 MG PO TABS: 250.0000 mg | ORAL_TABLET | Freq: Two times a day (BID) | ORAL | 0 refills | Status: DC | PRN

## 2020-04-01 MED ORDER — DIVALPROEX SODIUM ER 500 MG PO TB24: 500.0000 mg | ORAL_TABLET | Freq: Every day | ORAL | 0 refills | Status: DC

## 2020-04-01 MED ORDER — PROCHLORPERAZINE MALEATE 5 MG PO TABS
10.0000 mg | ORAL_TABLET | Freq: Once | ORAL | Status: AC
  Administered 2020-04-01: 10 mg via ORAL
  Filled 2020-04-01: qty 2

## 2020-04-01 NOTE — ED Provider Notes (Signed)
[facility] EMERGENCY DEPARTMENT Provider Note   CSN: [id number] Arrival date & time: 04/01/20  1232     History Chief Complaint  Patient presents with  . Headache    Jean [name] is a 54 y.o. female with PMH of migraines and chronic pain syndrome who presents to the ED with complaints of headache.  I reviewed patient's medical record and she was last seen by Dr. Leonie Man with [facility] on 08/26/2018 for her frequent headaches.  He increased her Depakote ER 500mg  to 750 mg daily and advised her to limit Tylenol and other over-the-counter analgesics.  She also had been seeing Dr. [name]  in the past and receiving epidural steroid injections for her left-sided C6-C7 radiculopathy, but she tells me that those did not provide her with much relief.  On my examination, patient tells me that she has been experiencing a right-sided headache x2 days.  Patient is also endorsing right-sided neck discomfort and has been applying a topical gel, with little relief.  Patient tells me that is similar to her typical headaches, but she is here in the ER because it simply has not yet gone away.  She states that that usually resolves with over-the-counter medications.  She has tried 1 aspirin as well as Tylenol, with little relief.  Patient describes it as throbbing.  No associated abdominal discomfort, nausea or vomiting, or visual changes aside from photosensitivity.  She also denies any objective fevers, chest pain or shortness of breath, numbness or weakness, or other focal deficits.  She states that she has been out of her Depakote for months because she never scheduled an additional appointment with her neurologist.  She also is unsure as to why her primary care provider has not continued her Depakote medications.  She states that she is currently living in a group home.   Recent MRI of cervical spine revealed foraminal stenosis, but nothing requiring surgical intervention per her orthopedic  surgeon.  Patient ambulates with walker due to complications from surgery, per patient.  HPI     Past Medical History:  Diagnosis Date  . Adnexal mass 2019   likely remnant fibroid on broad ligament; getting diagnostic lap  . Anxiety   . Arthritis    hip  . Asthma   . Carpal tunnel syndrome    bilateral  . Chest wall pain 10/15/2016  . Chronic lower back pain   . Depression   . Dyspnea    with exertion  . GERD (gastroesophageal reflux disease)   . Headache    migraines  . Herpes   . Hypertension   . Liver lesion, right lobe 12/01/2015   CT chest without contrast 11/30/15 with hepatic lesions measuring 2.0 x 1.7 cm of the right lobe and 1 x 1 cm on the dome. CT abd/pel 8/18: 1.3cm inferior R liver lobe, prob benign but recommend 3-92mo f/u with MRI w/wo IV contrast with attenuation MRI 6/19: combination of benign cavernous hemangiomas and small benign cysts  . Poor dental hygiene    chipped upper front and several loose teeth  . Substance abuse ([place])    cocaine/crack cocaine - last use 02/07/18  . SVD (spontaneous vaginal delivery)    x 1  . Uses walker    for ambulation  . Uterine leiomyoma 02/16/2016   s/p vaginal hysterectomy 2018  . Vaginal discharge 03/26/2018    Patient Active Problem List   Diagnosis Date Noted  . Low back pain 11/24/2019  . Impingement syndrome  of right shoulder 04/22/2019  . History of dental surgery 02/05/2019  . COPD GOLD 0 ? AB  12/06/2018  . 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 GERD (gastroesophageal reflux disease)   . Breast pain 06/23/2018  . Dyspareunia in female 09/13/2017  . Cervicalgia 04/24/2017  . CKD (chronic kidney disease) 10/29/2016  . Chronic pain syndrome 05/10/2016  . MDD (major depressive disorder) 10/19/2015  . Dyspnea on exertion 08/22/2015  . HTN (hypertension) 07/08/2015  . Routine health maintenance 07/08/2015  . Tobacco abuse 07/08/2015    Past Surgical History:  Procedure  Laterality Date  . LAPAROSCOPIC OVARIAN CYSTECTOMY Right 02/25/2018   Procedure: LAPAROSCOPIC REMOVAL OF RIGHT ADNEXAL MASS;  Surgeon: Chancy Milroy, MD;  Location: [facility];  Service: Gynecology;  Laterality: Right;  . SALPINGECTOMY Left    lap - ectopic preg  . TOTAL HIP ARTHROPLASTY Left 01/20/2016   Procedure: LEFT TOTAL HIP ARTHROPLASTY ANTERIOR APPROACH;  Surgeon: Mcarthur Rossetti, MD;  Location: [facility];  Service: Orthopedics;  Laterality: Left;  . TUBAL LIGATION    . VAGINAL HYSTERECTOMY Right 12/11/2016   Procedure: HYSTERECTOMY VAGINAL WITH RIGHT SALPINGECTOMY;  Surgeon: Chancy Milroy, MD;  Location: [facility];  Service: Gynecology;  Laterality: Right;     OB History    Gravida  3   Para  1   Term  1   Preterm      AB  2   Living  1     SAB      TAB  1   Ectopic  1   Multiple      Live Births              Family History  Problem Relation Age of Onset  . Cancer Mother   . Hypertension Mother   . Diabetes Mother   . Breast cancer Mother        diagnosed in her 61's  . Stroke Father   . Breast cancer Cousin     Social History   Tobacco Use  . Smoking status: Former Smoker    Packs/day: 0.50    Years: 29.00    Pack years: 14.50    Types: Cigarettes    Quit date: 11/25/2018    Years since quitting: 1.3  . Smokeless tobacco: Never Used  Vaping Use  . Vaping Use: Former  Substance Use Topics  . Alcohol use: Yes    Alcohol/week: 3.0 standard drinks    Types: 3 Cans of beer per week    Comment: Occasional Beer   . Drug use: No    Types: "Crack" cocaine, Cocaine    Comment: last 6 months ago.    Home Medications Prior to Admission medications   Medication Sig Start Date End Date Taking? Authorizing Provider  acetaminophen-codeine (TYLENOL #3) 300-30 MG tablet Take 1 tablet by mouth 2 (two) times daily as needed for moderate pain. 04/20/19   Marybelle Killings, MD  albuterol (VENTOLIN HFA) 108 (90 Base) MCG/ACT inhaler INHALE 2 PUFFS BY MOUTH  EVERY 6 HOURS AS NEEDED FOR COUGHING, WHEEZING, OR SHORTNESS OF BREATH 02/16/20   Harvie Heck, MD  amLODipine (NORVASC) 10 MG tablet TAKE 1 TABLET BY MOUTH EVERY DAY 02/10/20   Jeralyn Bennett, MD  cyclobenzaprine (FLEXERIL) 10 MG tablet TAKE 1 TABLET BY MOUTH TWICE A DAY AS NEEDED FOR MUSCLE SPASMS Patient not taking: Reported on 02/24/2020 02/11/20   Harvie Heck, MD  divalproex (DEPAKOTE ER) 500 MG  24 hr tablet Take 1 tablet (500 mg total) by mouth daily. 04/01/20   Corena Herter, PA-C  gabapentin (NEURONTIN) 300 MG capsule Take 1 capsule (300 mg total) by mouth at bedtime. 04/08/19   Jose Persia, MD  hydrochlorothiazide (HYDRODIURIL) 25 MG tablet TAKE 1 TABLET BY MOUTH EVERY DAY 02/12/20   Mosetta Anis, MD  losartan (COZAAR) 25 MG tablet TAKE 2 TABLETS BY MOUTH EVERY DAY 12/11/19   Jose Persia, MD  Magnesium Gluconate 250 MG TABS Take 1 tablet (250 mg total) by mouth 2 (two) times daily between meals as needed (migraine). 04/01/20   Corena Herter, PA-C  methylPREDNISolone (MEDROL) 4 MG tablet 6-day taper to be taken as directed Patient not taking: Reported on 02/24/2020 09/10/19   Lanae Crumbly, PA-C  nicotine (NICODERM CQ - DOSED IN MG/24 HR) 7 mg/24hr patch Place 7 mg onto the skin daily.    [provider]  pantoprazole (PROTONIX) 40 MG tablet Take 30- 60 min before your first and last meals of the day 04/08/19   Jose Persia, MD  sertraline (ZOLOFT) 100 MG tablet Take 1 tablet (100 mg total) by mouth daily. 04/08/19   Jose Persia, MD  SYMBICORT 80-4.5 MCG/ACT inhaler TAKE 2 PUFFS BY MOUTH TWICE A DAY 11/12/19   Harvie Heck, MD  tetrahydrozoline 0.05 % ophthalmic solution Place 1 drop into both eyes daily as needed (for dry eyes).     [provider]    Allergies    Lisinopril  Review of Systems   Review of Systems  All other systems reviewed and are negative.   Physical Exam Updated Vital Signs BP (!) 165/94   Pulse (!) 53   Temp 97.7 F (36.5 C)  (Oral)   Resp 16   LMP  (LMP Unknown)   SpO2 97%   Physical Exam Vitals and nursing note reviewed. Exam conducted with a chaperone present.  Constitutional:      General: She is not in acute distress.    Appearance: She is not toxic-appearing or diaphoretic.  HENT:     Head: Normocephalic and atraumatic.  Eyes:     General: No scleral icterus.    Extraocular Movements: Extraocular movements intact.     Conjunctiva/sclera: Conjunctivae normal.     Pupils: Pupils are equal, round, and reactive to light.     Comments: No nystagmus.  Neck:     Comments: ROM fully intact.  No cervical midline tenderness to palpation.  Tenderness over right trapezius.  No overlying skin changes or swelling noted. Cardiovascular:     Rate and Rhythm: Normal rate and regular rhythm.     Pulses: Normal pulses.     Heart sounds: Normal heart sounds.  Pulmonary:     Effort: Pulmonary effort is normal. No respiratory distress.     Breath sounds: Normal breath sounds.  Musculoskeletal:     Cervical back: Normal range of motion. No rigidity.  Skin:    General: Skin is dry.     Capillary Refill: Capillary refill takes less than 2 seconds.  Neurological:     General: No focal deficit present.     Mental Status: She is alert and oriented to person, place, and time.     GCS: GCS eye subscore is 4. GCS verbal subscore is 5. GCS motor subscore is 6.     Cranial Nerves: No cranial nerve deficit.     Sensory: No sensory deficit.     Motor: No weakness.  Coordination: Coordination normal.     Gait: Gait normal.     Comments: CN II through XII grossly intact.  PERRL and EOM intact.  No nystagmus.  Negative Brudzinski test.  Sensation intact throughout.  Moves all extremities with strength intact against resistance.  Negative Romberg.  Peripheral vision appears intact.  Psychiatric:        Mood and Affect: Mood normal.        Behavior: Behavior normal.        Thought Content: Thought content normal.      Comments: No confusion.  Answering questions appropriately.     ED Results / Procedures / Treatments   Labs (all labs ordered are listed, but only abnormal results are displayed) Labs Reviewed  COMPREHENSIVE METABOLIC PANEL - Abnormal; Notable for the following components:      Result Value   CO2 21 (*)    BUN 21 (*)    Creatinine, Ser 1.43 (*)    Total Protein 8.7 (*)    GFR, Estimated 43 (*)    All other components within normal limits  CBC    EKG EKG Interpretation  Date/Time:  Friday April 01 2020 12:42:29 EST Ventricular Rate:  85 PR Interval:  132 QRS Duration: 84 QT Interval:  386 QTC Calculation: 459 R Axis:   -7 Text Interpretation: Normal sinus rhythm Right atrial enlargement Left ventricular hypertrophy with repolarization abnormality ( R in aVL , Cornell product ) Cannot rule out Septal infarct , age undetermined Abnormal ECG since last tracing no significant change Confirmed by Noemi Chapel [phone]) on 04/01/2020 3:50:03 PM   Radiology No results found.  Procedures Procedures (including critical care time)  Medications Ordered in ED Medications  prochlorperazine (COMPAZINE) tablet 10 mg (10 mg Oral Given 04/01/20 1655)    ED Course  I have reviewed the triage vital signs and the nursing notes.  Pertinent labs & imaging results that were available during my care of the patient were reviewed by me and considered in my medical decision making (see chart for details).    MDM Rules/Calculators/A&P                          Patient reports that her headache is similar to her typical headache.  She used to take Depakote 750 mg daily with good control, however has not been taking that medication for quite some time.  Will obtain basic laboratory work-up to ensure that there is no liver or significant renal impairment and then send her home with Depakote and plans to follow-up with her primary care provider regarding today's encounter.  We will also obtain  CBC to rule out anemia as cause of her headache.  Her neurologic exam here in the ED was entirely unremarkable.  Patient's headache is not concerning for [facility], I[facility], or meningitis.  No meningismus on my exam.  She is very young for it to be a GCA/temporal arteries picture and do not feel as though work-up with sedimentation rate is warranted.  Her right-sided throbbing headache is suggestive of migraine.  I performed opioids are in and wait approach as they do not address the elevated calcitonin gene related peptide that are a known contributor for migraine headaches.  She would benefit from Compazine here in the ED.  We will also consider discharge home with magnesium gluconate 250 mg twice daily as a method to mitigate frequency of her migraine headaches.  On subsequent evaluation, patient is feeling improved  after Compazine.  She states that her headache is still mildly persistent, but better than when she came in.  She feels prepared for discharge.  Her daughter will pick her up.  Patient will follow up with her primary care provider first thing next week for ongoing evaluation and management.  Strict ED return precautions discussed.  Patient voices understanding and is agreeable to the plan.   Final Clinical Impression(s) / ED Diagnoses Final diagnoses:  Migraine without aura and without status migrainosus, not intractable    Rx / DC Orders ED Discharge Orders         Ordered    divalproex (DEPAKOTE ER) 500 MG 24 hr tablet  Daily        04/01/20 1845    Magnesium Gluconate 250 MG TABS  2 times daily between meals PRN        04/01/20 1845           Corena Herter, PA-C 04/01/20 1854    Noemi Chapel, MD 04/02/20 [phone]

## 2020-04-01 NOTE — Discharge Instructions (Addendum)
Please follow-up with your primary care provider first thing next week regarding today's encounter and for ongoing evaluation and management of your headache disorder.   I am glad that you have had some improvement with compazine.  I have prescribed refilled your Depakote which had been able to temporize your headaches in the past, as noted by your prior neurologist.  I have also prescribed you magnesium gluconate 250 mg twice daily to also help with your migraine headache symptoms.  Your neurologic exam here in the ED was reassuring.  Return to the ED or seek immediate medical attention should you experience any new or worsening symptoms.

## 2020-04-01 NOTE — ED Triage Notes (Signed)
Complaint of headache since Wednesday. Pt state she has history of migraines. NAD. VSS.

## 2020-04-01 NOTE — ED Notes (Signed)
Pt c/o 10/10 pain in head. Pt c/o blurred vision and photosensitivity. Pt denies N/V.

## 2020-04-07 ENCOUNTER — Encounter: Payer: Self-pay | Admitting: Internal Medicine

## 2020-04-07 ENCOUNTER — Ambulatory Visit (INDEPENDENT_AMBULATORY_CARE_PROVIDER_SITE_OTHER): Payer: Medicaid Other | Admitting: Internal Medicine

## 2020-04-07 VITALS — BP 138/88 | HR 68 | Temp 98.6°F | Ht 64.0 in | Wt 144.4 lb

## 2020-04-07 DIAGNOSIS — K219 Gastro-esophageal reflux disease without esophagitis: Secondary | ICD-10-CM

## 2020-04-07 DIAGNOSIS — F321 Major depressive disorder, single episode, moderate: Secondary | ICD-10-CM

## 2020-04-07 DIAGNOSIS — R0600 Dyspnea, unspecified: Secondary | ICD-10-CM | POA: Diagnosis not present

## 2020-04-07 DIAGNOSIS — F4321 Adjustment disorder with depressed mood: Secondary | ICD-10-CM | POA: Diagnosis not present

## 2020-04-07 DIAGNOSIS — R1319 Other dysphagia: Secondary | ICD-10-CM

## 2020-04-07 DIAGNOSIS — R0609 Other forms of dyspnea: Secondary | ICD-10-CM

## 2020-04-07 MED ORDER — SERTRALINE HCL 100 MG PO TABS: 100.0000 mg | ORAL_TABLET | Freq: Every day | ORAL | 1 refills | Status: DC

## 2020-04-07 MED ORDER — PANTOPRAZOLE SODIUM 40 MG PO TBEC
40.0000 mg | DELAYED_RELEASE_TABLET | Freq: Every day | ORAL | 1 refills | Status: DC
Start: 2020-04-07 — End: 2020-10-27

## 2020-04-07 NOTE — Progress Notes (Signed)
   CC: GERD and dysphagia, HTN, depression  HPI:  Jean [name] is a 54 y.o. history listed below presenting for follow-up of her GERD and dysphagia, HTN, and depression.  Past Medical History:  Diagnosis Date  . Adnexal mass 2019   likely remnant fibroid on broad ligament; getting diagnostic lap  . Anxiety   . Arthritis    hip  . Asthma   . Carpal tunnel syndrome    bilateral  . Chest wall pain 10/15/2016  . Chronic lower back pain   . Depression   . Dyspnea    with exertion  . GERD (gastroesophageal reflux disease)   . Headache    migraines  . Herpes   . Hypertension   . Liver lesion, right lobe 12/01/2015   CT chest without contrast 11/30/15 with hepatic lesions measuring 2.0 x 1.7 cm of the right lobe and 1 x 1 cm on the dome. CT abd/pel 8/18: 1.3cm inferior R liver lobe, prob benign but recommend 3-37mo f/u with MRI w/wo IV contrast with attenuation MRI 6/19: combination of benign cavernous hemangiomas and small benign cysts  . Poor dental hygiene    chipped upper front and several loose teeth  . Substance abuse ([place])    cocaine/crack cocaine - last use 02/07/18  . SVD (spontaneous vaginal delivery)    x 1  . Uses walker    for ambulation  . Uterine leiomyoma 02/16/2016   s/p vaginal hysterectomy 2018  . Vaginal discharge 03/26/2018   Review of Systems:   Constitutional: Negative for chills and fever.  Respiratory: Negative for shortness of breath.   Cardiovascular: Negative for chest pain and leg swelling.  Gastrointestinal: Negative for abdominal pain, nausea and vomiting. Positive for dysphagia, belching.  Neurological: Negative for dizziness and headaches.  Psych: Positive for depression.   Physical Exam:  Vitals:   04/07/20 1604  BP: 138/88  Pulse: 68  Temp: 98.6 F (37 C)  TempSrc: Oral  SpO2: 100%  Weight: 144 lb 6.4 oz (65.5 kg)  Height: 5\' 4"  (1.626 m)   Physical Exam HENT:     Head: Normocephalic and atraumatic.  Eyes:      Conjunctiva/sclera: Conjunctivae normal.     Pupils: Pupils are equal, round, and reactive to light.  Neck:     Thyroid: No thyromegaly.  Cardiovascular:     Rate and Rhythm: Normal rate and regular rhythm.     Heart sounds: Normal heart sounds. No murmur heard.  No friction rub. No gallop.   Pulmonary:     Effort: Pulmonary effort is normal. No respiratory distress.     Breath sounds: Normal breath sounds. No wheezing.  Abdominal:     General: Bowel sounds are normal. There is no distension.     Palpations: Abdomen is soft.  Musculoskeletal:        General: Normal range of motion.     Cervical back: Normal range of motion and neck supple.  Skin:    General: Skin is warm and dry.     Findings: No erythema.  Neurological:     Mental Status: She is alert and oriented to person, place, and time.     Gait: Gait is intact.  Psychiatric:        Mood and Affect: Mood and affect normal.      Assessment & Plan:   See Encounters Tab for problem based charting.  Patient discussed with Dr. Rebeca Alert

## 2020-04-07 NOTE — Patient Instructions (Addendum)
[name],  It was a pleasure to see you today. Thank you for coming in.   Today we discussed your acid reflux. I am sorry that you are still feeling poorly. I have restarted the Pantoprazole daily. I have also referred you to GI for further evaluation.   We also discussed your depression. I have restarted your sertraline 100 mg daily. I have also referred you to our behavioral health specialist.   Please return to clinic in 3 months or sooner if needed.   Thank you again for coming in.   Asencion Noble.D.

## 2020-04-08 NOTE — Assessment & Plan Note (Signed)
Patient has been on sertraline in the past however states that she ran out of this medication and has not been on anything for months. She states that she feels like we have given up on her because she hasn't gotten any of her medications, reports feeling down and depressed recently. PHQ-9 today is 12. She had previously followed with Ms. Hicks for her depression and has not seen anyone recently. Discussed restarting sertraline and referring her to [facility] and she is in agreement.   -Sertraline 100 mg daily -Referral to IBH -RTC in 1 month

## 2020-04-08 NOTE — Assessment & Plan Note (Signed)
Patients reports that for the past 2-3 weeks she has been having heartburn and acid reflux symptoms. She also endorses difficulty swallowing, burning sensation with swallowing, and increased belching. She reports pain with both solids and liquids. She states that it feels stuck in her throat. Symptoms are worse when she is eating spicy foods and at night. She denies any nausea, vomiting, fevers, chills, headaches, or SOB. She has not had any recent changes in her diet or recent travel. She had previously been on ranatidine prior to it's removal off the market and states that it worked well for her. She is not on any medications right now, had previously been on pantoprazole however stopped because the pharmacy did not refill this.   On chart review patient had symptoms similar to this with dysphagia in 11/2018 and had been referred to GI at that time, she states that she was not able to see them due to the pandemic.   -Restart pantroprazole 40 mg daily -Referral to GI for possible EGD

## 2020-04-09 ENCOUNTER — Other Ambulatory Visit: Payer: Self-pay | Admitting: Internal Medicine

## 2020-04-13 ENCOUNTER — Encounter: Payer: Self-pay | Admitting: Internal Medicine

## 2020-04-19 NOTE — Progress Notes (Signed)
Internal Medicine Clinic Attending  Case discussed with Dr. Krienke at the time of the visit.  We reviewed the resident's history and exam and pertinent patient test results.  I agree with the assessment, diagnosis, and plan of care documented in the resident's note.  Velda Wendt, M.D., Ph.D.  

## 2020-04-28 ENCOUNTER — Ambulatory Visit: Payer: Medicaid Other | Admitting: Behavioral Health

## 2020-04-28 ENCOUNTER — Other Ambulatory Visit: Payer: Self-pay

## 2020-04-28 DIAGNOSIS — F331 Major depressive disorder, recurrent, moderate: Secondary | ICD-10-CM

## 2020-04-28 NOTE — BH Specialist Note (Signed)
Integrated Behavioral Health via Telemedicine Visit  04/28/2020 Jean [name] [id number]  Number of [place] visits: Initial Session Start time: 11:00am  Session End time: 11:50am Total time: 50   Referring Provider: Dr. Sherry Ruffing, MD Patient/Family location: In her home in bed alone [facility] Provider location: [facility] Office All persons participating in visit: Pt & Clinician Types of Service: Intake/Assessment process  I connected with Jean [name] and/or Jean [name]'s self by Telephone  (Video is Caregility application) and verified that I am speaking with the correct person using two identifiers.Discussed confidentiality: Yes   I discussed the limitations of telemedicine and the availability of in person appointments.  Discussed there is a possibility of technology failure and discussed alternative modes of communication if that failure occurs.  I discussed that engaging in this telemedicine visit, they consent to the provision of behavioral healthcare and the services will be billed under their insurance.  Patient and/or legal guardian expressed understanding and consented to Telemedicine visit: Yes   Presenting Concerns: Patient and/or family reports the following symptoms/concerns: Feeling down due to health status changes Duration of problem: several months; Severity of problem: moderate  Patient and/or Family's Strengths/Protective Factors: Concrete supports in place (healthy food, safe environments, etc.). Pt has Dtr & Str who visit infrequently, but she does talk to Dtr on the phone. Pt is in a relationship w/the Owner of the home where she resides in a room she rents. Pt has [place]; unsure about Disability status. Pt has belief in G_d.  Goals Addressed: Patient will: 1.  Reduce symptoms of: depression, Pt has felt her health status changes are pretty severe & she stays in her room most of the time away from ppl. Pt admits she can access crack  cocaine, but for the past month she has gone, "cold [place]" & is not using. 2.  Increase knowledge and/or ability of: healthy habits and self-management skills  3.  Demonstrate ability to: Increase adequate support systems for patient/family and support for one month recovery  Progress towards Goals: Estb'd today  Interventions: Interventions utilized:  Intake/Assessment process Standardized Assessments completed: Not Needed  Patient and/or Family Response: Pt receptive to having psychotherapy sessions bi-monthly  Assessment: Patient currently experiencing depressive mood.   Patient may benefit from Recovery Support/Relapse Prevention, supportive cslg for Pt beh activation.  Plan: 1. Follow up with behavioral health clinician on : 05/12/2020 @ 11:00am via telehealth 2. Behavioral recommendations: Cont to attend to health issues, write down concerns btwn sessions 3. Referral(s): [place] (In Clinic)  I discussed the assessment and treatment plan with the patient and/or parent/guardian. They were provided an opportunity to ask questions and all were answered. They agreed with the plan and demonstrated an understanding of the instructions.   They were advised to call back or seek an in-person evaluation if the symptoms worsen or if the condition fails to improve as anticipated.  Donnetta Hutching, LMFT

## 2020-05-10 ENCOUNTER — Institutional Professional Consult (permissible substitution): Payer: Medicaid Other | Admitting: Behavioral Health

## 2020-05-10 ENCOUNTER — Other Ambulatory Visit: Payer: Self-pay

## 2020-05-10 ENCOUNTER — Ambulatory Visit (INDEPENDENT_AMBULATORY_CARE_PROVIDER_SITE_OTHER): Payer: Medicaid Other | Admitting: Student

## 2020-05-10 DIAGNOSIS — U071 COVID-19: Secondary | ICD-10-CM

## 2020-05-10 NOTE — Progress Notes (Signed)
   CC: cough  This is a telephone encounter between Jean [name] and Jean [name] on 05/10/2020 for cough. The visit was conducted with the patient located at home and Jean [name] at [facility]. The patient's identity was confirmed using their DOB and current address. The patient has consented to being evaluated through a telephone encounter and understands the associated risks (an examination cannot be done and the patient may need to come in for an appointment) / benefits (allows the patient to remain at home, decreasing exposure to coronavirus). I personally spent 18 minutes on medical discussion.   HPI:  [name] is a 55 y.o. with PMH as below. She presents for cough for the past 5 days with positive COVID test.  Please see A&P for assessment of the patient's acute and chronic medical conditions.    Past Medical History:  Diagnosis Date  . Adnexal mass 2019   likely remnant fibroid on broad ligament; getting diagnostic lap  . Anxiety   . Arthritis    hip  . Asthma   . Carpal tunnel syndrome    bilateral  . Chest wall pain 10/15/2016  . Chronic lower back pain   . Depression   . Dyspnea    with exertion  . GERD (gastroesophageal reflux disease)   . Headache    migraines  . Herpes   . Hypertension   . Liver lesion, right lobe 12/01/2015   CT chest without contrast 11/30/15 with hepatic lesions measuring 2.0 x 1.7 cm of the right lobe and 1 x 1 cm on the dome. CT abd/pel 8/18: 1.3cm inferior R liver lobe, prob benign but recommend 3-55mo f/u with MRI w/wo IV contrast with attenuation MRI 6/19: combination of benign cavernous hemangiomas and small benign cysts  . Poor dental hygiene    chipped upper front and several loose teeth  . Substance abuse (HCC)    cocaine/crack cocaine - last use 02/07/18  . SVD (spontaneous vaginal delivery)    x 1  . Uses walker    for ambulation  . Uterine leiomyoma 02/16/2016   s/p vaginal hysterectomy 2018  . Vaginal discharge 03/26/2018    Review of Systems:  Review of Systems  Constitutional: Positive for chills, fever and malaise/fatigue.  HENT: Positive for congestion. Negative for sinus pain and sore throat.        No change in taste or smell  Eyes: Negative for blurred vision.  Respiratory: Positive for cough, sputum production and shortness of breath.   Cardiovascular: Negative for chest pain and palpitations.  Gastrointestinal: Negative for abdominal pain, diarrhea, nausea and vomiting.  Genitourinary: Negative for dysuria and urgency.  Musculoskeletal: Positive for myalgias. Negative for falls.  Neurological: Negative for dizziness and weakness.       Assessment & Plan:   See Encounters Tab for problem based charting.  Patient discussed with Dr. Mayford Knife

## 2020-05-11 DIAGNOSIS — U071 COVID-19: Secondary | ICD-10-CM | POA: Insufficient documentation

## 2020-05-11 NOTE — Assessment & Plan Note (Signed)
Patient states she has had cough producing green sputum and associated SOB for the past 5 days. She is unvaccinated for COVID-19. She and another roommate have both had symptoms and went for Covid-19 testing today. Patient was called earlier in the day and told she tested positive. States she has been having fevers with tmax of 102, but now improved to 101 today. She also complains of significant body aches with associated faitgue and malaise. States cough is different from her normal COPD, but now is getting a little better. During visit patient able to speak in full sentences without becoming short of breath with minimal coughing heard. Patient has been OTC cold medications which help with her symptoms. Encouraged her to continue to treat symptoms, drink plenty of fluids, and that likely she will slowly start recovering. Also advised she call the monoclonal antibody clinic to see if she qualifies for an outpatient infusion given her history of COPD. Discussed risks and benefits of the treatment. She is hesitant to go out due to body aches but states she will consider it. Instructed her to isolate for 5 days from first symptoms or until she is fever free for >24 hr before leaving house and that she needs to mask for at least 5 days after isolation, which she is agreeable to. She will consider getting the COVID vaccine once feeling better. Instructed her that if symptoms of SOB get worse she will need to go to the ED for further evaluation.   Plan - Patient to call MAB clinic - Supportive care with fluids, guaifenesin, tylenol, and rest - Revisit vaccination once feeling better

## 2020-05-12 ENCOUNTER — Telehealth: Payer: Self-pay | Admitting: Behavioral Health

## 2020-05-12 ENCOUNTER — Other Ambulatory Visit: Payer: Self-pay

## 2020-05-12 ENCOUNTER — Ambulatory Visit: Payer: Medicaid Other | Admitting: Behavioral Health

## 2020-05-12 NOTE — Progress Notes (Signed)
Internal Medicine Clinic Attending  Case discussed with Dr. Elaina Pattee   I agree with the assessment, diagnosis, and plan of care documented in the resident's note.

## 2020-05-12 NOTE — Telephone Encounter (Signed)
Pt contacted 4 times until 11:30 for psychotherapy session.   Clinician unable to leave a msg due to mailbox being full.

## 2020-05-12 NOTE — Telephone Encounter (Signed)
Pt missed appt today, but returned call to Clinician w/discussion & intent to r/s visit.   Dr. Monna Fam

## 2020-05-13 ENCOUNTER — Telehealth: Payer: Self-pay

## 2020-05-13 NOTE — Telephone Encounter (Signed)
RTC, VM obtained that mailbox is full and RN is unable to leave message. SChaplin, RN,BSN

## 2020-05-13 NOTE — Telephone Encounter (Signed)
So, I think Dr. Lisabeth Devoid may need to place the referral as well if she has risk factors for worsening covid. There is an email from Dr. Philipp Ovens explaining how this is done.

## 2020-05-13 NOTE — Telephone Encounter (Signed)
Patient returned call. States she hasn't been able to reach anyone at the infusion clinic and they have not returned her calls. States, "I am so tired" several times. Denies SHOB. Advised to keep trying the number for infusion clinic or head to ED if she feels she needs to. Otherwise, continue to rest and drink plenty of fluids. She agrees. Hubbard Hartshorn, BSN, RN-BC

## 2020-05-13 NOTE — Telephone Encounter (Signed)
Requesting to speak with a nurse about having body ache. Please call pt back.

## 2020-05-13 NOTE — Telephone Encounter (Signed)
I have sent in the referral for this patient. However supplies are very limited. She will hopefully be contacted and triaged by the [place] clinic and may be able to receive MAB treatment depending on availability.

## 2020-05-16 ENCOUNTER — Telehealth: Payer: Self-pay

## 2020-05-16 NOTE — Telephone Encounter (Signed)
Pt is needed a referral to [facility] to infusion clinic for COVID pls contact [phone]

## 2020-05-16 NOTE — Telephone Encounter (Signed)
Patient returned call. Explained referral was sent on 05/14/2019. She will contact the infusion center. Hubbard Hartshorn, BSN, RN-BC

## 2020-05-16 NOTE — Telephone Encounter (Signed)
Per Dr. Lisabeth Devoid, referral for MAB infusion sent on 05/13/2020. Returned call to patient. No answer. Left message on VM requesting return call. Hubbard Hartshorn, BSN, RN-BC

## 2020-05-16 NOTE — Telephone Encounter (Signed)
A rep from [place] [facility] is requesting a call back about paperwork that was faxed over on 05/09/20 her contact info [phone]

## 2020-05-17 NOTE — Telephone Encounter (Signed)
PCS form placed in Yellow Team's box for completion and signature. Last OV was 05/10/2020 with [facility] Team. Hubbard Hartshorn, BSN, RN-BC

## 2020-05-26 NOTE — Telephone Encounter (Signed)
Completed PCS form and OV notes from 05/10/2020 faxed to [phone]. Fax confirmation receipt received. Hubbard Hartshorn, BSN, RN-BC

## 2020-06-21 ENCOUNTER — Ambulatory Visit: Payer: Medicaid Other | Admitting: Gastroenterology

## 2020-06-22 NOTE — Addendum Note (Signed)
Addended by: Hulan Fray on: 06/22/2020 06:41 AM   Modules accepted: Orders

## 2020-06-30 ENCOUNTER — Other Ambulatory Visit: Payer: Self-pay

## 2020-06-30 ENCOUNTER — Emergency Department (HOSPITAL_COMMUNITY)
Admission: EM | Admit: 2020-06-30 | Discharge: 2020-06-30 | Disposition: A | Discharge status: Home / Self Care | Payer: Medicaid Other | Attending: Emergency Medicine | Admitting: Emergency Medicine

## 2020-06-30 ENCOUNTER — Encounter (HOSPITAL_COMMUNITY): Payer: Self-pay

## 2020-06-30 ENCOUNTER — Emergency Department (HOSPITAL_BASED_OUTPATIENT_CLINIC_OR_DEPARTMENT_OTHER): Payer: Medicaid Other

## 2020-06-30 ENCOUNTER — Emergency Department (HOSPITAL_COMMUNITY): Payer: Medicaid Other

## 2020-06-30 ENCOUNTER — Encounter (HOSPITAL_COMMUNITY): Payer: Self-pay | Admitting: Emergency Medicine

## 2020-06-30 ENCOUNTER — Ambulatory Visit (HOSPITAL_COMMUNITY)
Admission: EM | Admit: 2020-06-30 | Discharge: 2020-06-30 | Disposition: A | Discharge status: Other Acute Inpatient Hospital | Payer: Medicaid Other

## 2020-06-30 DIAGNOSIS — Z8616 Personal history of COVID-19: Secondary | ICD-10-CM | POA: Diagnosis not present

## 2020-06-30 DIAGNOSIS — J449 Chronic obstructive pulmonary disease, unspecified: Secondary | ICD-10-CM | POA: Insufficient documentation

## 2020-06-30 DIAGNOSIS — Z87891 Personal history of nicotine dependence: Secondary | ICD-10-CM | POA: Diagnosis not present

## 2020-06-30 DIAGNOSIS — I129 Hypertensive chronic kidney disease with stage 1 through stage 4 chronic kidney disease, or unspecified chronic kidney disease: Secondary | ICD-10-CM | POA: Insufficient documentation

## 2020-06-30 DIAGNOSIS — M25462 Effusion, left knee: Secondary | ICD-10-CM | POA: Insufficient documentation

## 2020-06-30 DIAGNOSIS — M7989 Other specified soft tissue disorders: Secondary | ICD-10-CM | POA: Diagnosis not present

## 2020-06-30 DIAGNOSIS — N189 Chronic kidney disease, unspecified: Secondary | ICD-10-CM | POA: Diagnosis not present

## 2020-06-30 DIAGNOSIS — J45909 Unspecified asthma, uncomplicated: Secondary | ICD-10-CM | POA: Insufficient documentation

## 2020-06-30 DIAGNOSIS — M25452 Effusion, left hip: Secondary | ICD-10-CM | POA: Diagnosis not present

## 2020-06-30 DIAGNOSIS — M25562 Pain in left knee: Secondary | ICD-10-CM

## 2020-06-30 DIAGNOSIS — M79605 Pain in left leg: Secondary | ICD-10-CM | POA: Diagnosis not present

## 2020-06-30 DIAGNOSIS — Z96642 Presence of left artificial hip joint: Secondary | ICD-10-CM | POA: Diagnosis not present

## 2020-06-30 DIAGNOSIS — M25552 Pain in left hip: Secondary | ICD-10-CM

## 2020-06-30 DIAGNOSIS — Z79899 Other long term (current) drug therapy: Secondary | ICD-10-CM | POA: Diagnosis not present

## 2020-06-30 LAB — COMPREHENSIVE METABOLIC PANEL
ALT: 11 U/L (ref 0–44)
AST: 17 U/L (ref 15–41)
Albumin: 3.6 g/dL (ref 3.5–5.0)
Alkaline Phosphatase: 75 U/L (ref 38–126)
Anion gap: 9 (ref 5–15)
BUN: 15 mg/dL (ref 6–20)
CO2: 24 mmol/L (ref 22–32)
Calcium: 9.6 mg/dL (ref 8.9–10.3)
Chloride: 105 mmol/L (ref 98–111)
Creatinine, Ser: 1.19 mg/dL — ABNORMAL HIGH (ref 0.44–1.00)
GFR, Estimated: 53 mL/min — ABNORMAL LOW (ref >60)
Glucose, Bld: 82 mg/dL (ref 70–99)
Potassium: 4.6 mmol/L (ref 3.5–5.1)
Sodium: 138 mmol/L (ref 135–145)
Total Bilirubin: 0.4 mg/dL (ref 0.3–1.2)
Total Protein: 7.8 g/dL (ref 6.5–8.1)

## 2020-06-30 LAB — CBC
HCT: 38.7 % (ref 36.0–46.0)
Hemoglobin: 12.3 g/dL (ref 12.0–15.0)
MCH: 31.6 pg (ref 26.0–34.0)
MCHC: 31.8 g/dL (ref 30.0–36.0)
MCV: 99.5 fL (ref 80.0–100.0)
Platelets: 335 10*3/uL (ref 150–400)
RBC: 3.89 MIL/uL (ref 3.87–5.11)
RDW: 14.2 % (ref 11.5–15.5)
WBC: 5.5 10*3/uL (ref 4.0–10.5)
nRBC: 0 % (ref 0.0–0.2)

## 2020-06-30 LAB — LACTIC ACID, PLASMA: Lactic Acid, Venous: 0.8 mmol/L (ref 0.5–1.9)

## 2020-06-30 MED ORDER — IBUPROFEN 600 MG PO TABS: 600.0000 mg | ORAL_TABLET | Freq: Four times a day (QID) | ORAL | 0 refills | Status: DC | PRN

## 2020-06-30 MED ORDER — CYCLOBENZAPRINE HCL 10 MG PO TABS: 10.0000 mg | ORAL_TABLET | Freq: Two times a day (BID) | ORAL | 0 refills | Status: DC | PRN

## 2020-06-30 NOTE — Discharge Instructions (Signed)
You have been evaluated for your left leg and left knee pain.  Fortunately no evidence of blood clot in your leg and x-ray of your hip did not show anything concerning.  You do have some fluid in your left knee joint which is likely contributing to your pain.  Wear knee sleeves, keep your leg elevated when resting, take ibuprofen and muscle relaxant as needed.  Follow-up with orthopedist for further care.

## 2020-06-30 NOTE — ED Provider Notes (Signed)
[place]    CSN: [id number] Arrival date & time: 06/30/20  1020      History   Chief Complaint Chief Complaint  Patient presents with  . Knee Pain  . Hip Pain    HPI Jean [name] is a 55 y.o. female.   HPI  Patient states that for the past few days she has had left knee swelling and left hip pain.  Hip pain started after she had a fall about 2 weeks ago.  She states that when she fell her walker wheel caught an object in her house and she fell on her right hip.  The right hip is not causing her pain.  She states in terms of her knee pain and swelling she has noticed that the knee looks bigger in size compared to the opposite side, she has most of her pain and swelling of the posterior knee and pain with ambulation.  She is unsure if she has had any shortness of breath or chest pain but denies any currently.  She has been using her walker for symptoms without relief.   Past Medical History:  Diagnosis Date  . Adnexal mass 2019   likely remnant fibroid on broad ligament; getting diagnostic lap  . Anxiety   . Arthritis    hip  . Asthma   . Carpal tunnel syndrome    bilateral  . Chest wall pain 10/15/2016  . Chronic lower back pain   . Depression   . Dyspnea    with exertion  . GERD (gastroesophageal reflux disease)   . Headache    migraines  . Herpes   . Hypertension   . Liver lesion, right lobe 12/01/2015   CT chest without contrast 11/30/15 with hepatic lesions measuring 2.0 x 1.7 cm of the right lobe and 1 x 1 cm on the dome. CT abd/pel 8/18: 1.3cm inferior R liver lobe, prob benign but recommend 3-32mo f/u with MRI w/wo IV contrast with attenuation MRI 6/19: combination of benign cavernous hemangiomas and small benign cysts  . Poor dental hygiene    chipped upper front and several loose teeth  . Substance abuse ([place])    cocaine/crack cocaine - last use 02/07/18  . SVD (spontaneous vaginal delivery)    x 1  . Uses walker    for ambulation  . Uterine  leiomyoma 02/16/2016   s/p vaginal hysterectomy 2018  . Vaginal discharge 03/26/2018    Patient Active Problem List   Diagnosis Date Noted  . COVID-19 virus infection 05/11/2020  . Low back pain 11/24/2019  . Impingement syndrome of right shoulder 04/22/2019  . History of dental surgery 02/05/2019  . COPD GOLD 0 ? AB  12/06/2018  . 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 GERD (gastroesophageal reflux disease)   . Breast pain 06/23/2018  . Dyspareunia in female 09/13/2017  . Cervicalgia 04/24/2017  . CKD (chronic kidney disease) 10/29/2016  . Chronic pain syndrome 05/10/2016  . MDD (major depressive disorder) 10/19/2015  . Dyspnea on exertion 08/22/2015  . HTN (hypertension) 07/08/2015  . Routine health maintenance 07/08/2015  . Tobacco abuse 07/08/2015    Past Surgical History:  Procedure Laterality Date  . LAPAROSCOPIC OVARIAN CYSTECTOMY Right 02/25/2018   Procedure: LAPAROSCOPIC REMOVAL OF RIGHT ADNEXAL MASS;  Surgeon: Chancy Milroy, MD;  Location: [facility];  Service: Gynecology;  Laterality: Right;  . SALPINGECTOMY Left    lap - ectopic preg  . TOTAL HIP ARTHROPLASTY Left  01/20/2016   Procedure: LEFT TOTAL HIP ARTHROPLASTY ANTERIOR APPROACH;  Surgeon: Mcarthur Rossetti, MD;  Location: [facility];  Service: Orthopedics;  Laterality: Left;  . TUBAL LIGATION    . VAGINAL HYSTERECTOMY Right 12/11/2016   Procedure: HYSTERECTOMY VAGINAL WITH RIGHT SALPINGECTOMY;  Surgeon: Chancy Milroy, MD;  Location: [facility];  Service: Gynecology;  Laterality: Right;    OB History    Gravida  3   Para  1   Term  1   Preterm      AB  2   Living  1     SAB      IAB  1   Ectopic  1   Multiple      Live Births               Home Medications    Prior to Admission medications   Medication Sig Start Date End Date Taking? Authorizing Provider  acetaminophen-codeine (TYLENOL #3) 300-30 MG tablet Take 1 tablet by mouth 2 (two) times daily as  needed for moderate pain. 04/20/19   Marybelle Killings, MD  albuterol (VENTOLIN HFA) 108 (90 Base) MCG/ACT inhaler INHALE 2 PUFFS BY MOUTH EVERY 6 HOURS AS NEEDED FOR COUGHING, WHEEZING, OR SHORTNESS OF BREATH 02/16/20   Harvie Heck, MD  amLODipine (NORVASC) 10 MG tablet TAKE 1 TABLET BY MOUTH EVERY DAY 02/10/20   Jeralyn Bennett, MD  cyclobenzaprine (FLEXERIL) 10 MG tablet TAKE 1 TABLET BY MOUTH TWICE A DAY AS NEEDED FOR MUSCLE SPASMS 02/11/20   Harvie Heck, MD  divalproex (DEPAKOTE ER) 500 MG 24 hr tablet Take 1 tablet (500 mg total) by mouth daily. 04/01/20   Corena Herter, PA-C  gabapentin (NEURONTIN) 300 MG capsule TAKE 1 CAPSULE BY MOUTH EVERYDAY AT BEDTIME 04/11/20   Agyei, Caprice Kluver, MD  hydrochlorothiazide (HYDRODIURIL) 25 MG tablet TAKE 1 TABLET BY MOUTH EVERY DAY 02/12/20   Mosetta Anis, MD  losartan (COZAAR) 25 MG tablet TAKE 2 TABLETS BY MOUTH EVERY DAY 12/11/19   Jose Persia, MD  Magnesium Gluconate 250 MG TABS Take 1 tablet (250 mg total) by mouth 2 (two) times daily between meals as needed (migraine). 04/01/20   Corena Herter, PA-C  methylPREDNISolone (MEDROL) 4 MG tablet 6-day taper to be taken as directed 09/10/19   Lanae Crumbly, PA-C  nicotine (NICODERM CQ - DOSED IN MG/24 HR) 7 mg/24hr patch Place 7 mg onto the skin daily.    [provider]  pantoprazole (PROTONIX) 40 MG tablet Take 1 tablet (40 mg total) by mouth daily. 04/07/20   Asencion Noble, MD  sertraline (ZOLOFT) 100 MG tablet Take 1 tablet (100 mg total) by mouth daily. 04/07/20   Asencion Noble, MD  SYMBICORT 80-4.5 MCG/ACT inhaler TAKE 2 PUFFS BY MOUTH TWICE A DAY 11/12/19   Harvie Heck, MD  tetrahydrozoline 0.05 % ophthalmic solution Place 1 drop into both eyes daily as needed (for dry eyes).     [provider]    Family History Family History  Problem Relation Age of Onset  . Cancer Mother   . Hypertension Mother   . Diabetes Mother   . Breast cancer Mother        diagnosed in her  22's  . Stroke Father   . Breast cancer Cousin     Social History Social History   Tobacco Use  . Smoking status: Former Smoker    Packs/day: 0.50    Years: 29.00  Pack years: 14.50    Types: Cigarettes    Quit date: 11/25/2018    Years since quitting: 1.5  . Smokeless tobacco: Never Used  Vaping Use  . Vaping Use: Former  Substance Use Topics  . Alcohol use: Yes    Alcohol/week: 3.0 standard drinks    Types: 3 Cans of beer per week    Comment: Occasional Beer   . Drug use: No    Types: "Crack" cocaine, Cocaine    Comment: last 6 months ago.     Allergies   Lisinopril   Review of Systems Review of Systems  As stated above in HPI Physical Exam Triage Vital Signs ED Triage Vitals  Enc Vitals Group     BP 06/30/20 1120 (!) 163/95     Pulse Rate 06/30/20 1120 63     Resp 06/30/20 1120 18     Temp 06/30/20 1120 98.3 F (36.8 C)     Temp Source 06/30/20 1120 Oral     SpO2 06/30/20 1120 98 %     Weight --      Height --      Head Circumference --      Peak Flow --      Pain Score 06/30/20 1119 9     Pain Loc --      Pain Edu? --      Excl. in [place]? --    No data found.  Updated Vital Signs BP (!) 163/95 (BP Location: Right Arm)   Pulse 63   Temp 98.3 F (36.8 C) (Oral)   Resp 18   LMP  (LMP Unknown)   SpO2 98%   Physical Exam Vitals and nursing note reviewed.  Constitutional:      General: She is not in acute distress.    Appearance: She is not ill-appearing, toxic-appearing or diaphoretic.  Cardiovascular:     Rate and Rhythm: Normal rate and regular rhythm.     Pulses: Normal pulses.     Heart sounds: Normal heart sounds.  Pulmonary:     Effort: Pulmonary effort is normal.     Breath sounds: Normal breath sounds.  Musculoskeletal:     Cervical back: Neck supple.     Comments: Tenderness to palpation of the left lateral hip extending inferiorly to the mid thigh. No rash of skin.   Skin:    General: Skin is warm and dry.     Comments: Left  knee with edema, increased warmth and posterior tenderness. Positive Homans sign of the left leg. Calf size appears equal.   Neurological:     General: No focal deficit present.     Mental Status: She is alert.     Comments: Walking with a limp of the left leg with her rolling walker      UC Treatments / Results  Labs (all labs ordered are listed, but only abnormal results are displayed) Labs Reviewed - No data to display  EKG   Radiology No results found.  Procedures Procedures (including critical care time)  Medications Ordered in UC Medications - No data to display  Initial Impression / Assessment and Plan / UC Course  I have reviewed the triage vital signs and the nursing notes.  Pertinent labs & imaging results that were available during my care of the patient were reviewed by me and considered in my medical decision making (see chart for details).     New.  I discussed with patient my concern to ensure that she does  not have a DVT and given her history I do want her to have imaging of both her knee and her hip.  I have recommended that she be seen in the emergency room due to the complexity and her history.  She appears stable at this time and she wishes to travel via private vehicle across our parking lot to the emergency room. Reviewed red flag signs and symptoms.  Final Clinical Impressions(s) / UC Diagnoses   Final diagnoses:  None   Discharge Instructions   None    ED Prescriptions    None     PDMP not reviewed this encounter.   Hughie Closs, [place] 06/30/20 1142

## 2020-06-30 NOTE — ED Provider Notes (Signed)
[facility] EMERGENCY DEPARTMENT Provider Note   CSN: [id number] Arrival date & time: 06/30/20  1155     History No chief complaint on file.   Jean [name] is a 55 y.o. female sent in today by urgent care for evaluation of left hip pain and swelling.  Patient reports onset of symptoms 2 weeks ago describes aching left hip pain constant radiates down to mid thigh worsened with movement and palpation no alleviating factors and moderate intensity.  She reports she feels relatively swollen as well.  She reports she went to urgent care just prior to arrival and was sent into the ER for rule out of DVT.  Denies fever/chills, fall/injury, numbness/tingling, weakness, abdominal pain, chest pain/shortness of breath or any additional concerns.  HPI     Past Medical History:  Diagnosis Date  . Adnexal mass 2019   likely remnant fibroid on broad ligament; getting diagnostic lap  . Anxiety   . Arthritis    hip  . Asthma   . Carpal tunnel syndrome    bilateral  . Chest wall pain 10/15/2016  . Chronic lower back pain   . Depression   . Dyspnea    with exertion  . GERD (gastroesophageal reflux disease)   . Headache    migraines  . Herpes   . Hypertension   . Liver lesion, right lobe 12/01/2015   CT chest without contrast 11/30/15 with hepatic lesions measuring 2.0 x 1.7 cm of the right lobe and 1 x 1 cm on the dome. CT abd/pel 8/18: 1.3cm inferior R liver lobe, prob benign but recommend 3-60mo f/u with MRI w/wo IV contrast with attenuation MRI 6/19: combination of benign cavernous hemangiomas and small benign cysts  . Poor dental hygiene    chipped upper front and several loose teeth  . Substance abuse ([place])    cocaine/crack cocaine - last use 02/07/18  . SVD (spontaneous vaginal delivery)    x 1  . Uses walker    for ambulation  . Uterine leiomyoma 02/16/2016   s/p vaginal hysterectomy 2018  . Vaginal discharge 03/26/2018    Patient Active Problem List    Diagnosis Date Noted  . COVID-19 virus infection 05/11/2020  . Low back pain 11/24/2019  . Impingement syndrome of right shoulder 04/22/2019  . History of dental surgery 02/05/2019  . COPD GOLD 0 ? AB  12/06/2018  . 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 GERD (gastroesophageal reflux disease)   . Breast pain 06/23/2018  . Dyspareunia in female 09/13/2017  . Cervicalgia 04/24/2017  . CKD (chronic kidney disease) 10/29/2016  . Chronic pain syndrome 05/10/2016  . MDD (major depressive disorder) 10/19/2015  . Dyspnea on exertion 08/22/2015  . HTN (hypertension) 07/08/2015  . Routine health maintenance 07/08/2015  . Tobacco abuse 07/08/2015    Past Surgical History:  Procedure Laterality Date  . LAPAROSCOPIC OVARIAN CYSTECTOMY Right 02/25/2018   Procedure: LAPAROSCOPIC REMOVAL OF RIGHT ADNEXAL MASS;  Surgeon: Chancy Milroy, MD;  Location: [facility];  Service: Gynecology;  Laterality: Right;  . SALPINGECTOMY Left    lap - ectopic preg  . TOTAL HIP ARTHROPLASTY Left 01/20/2016   Procedure: LEFT TOTAL HIP ARTHROPLASTY ANTERIOR APPROACH;  Surgeon: Mcarthur Rossetti, MD;  Location: [facility];  Service: Orthopedics;  Laterality: Left;  . TUBAL LIGATION    . VAGINAL HYSTERECTOMY Right 12/11/2016   Procedure: HYSTERECTOMY VAGINAL WITH RIGHT SALPINGECTOMY;  Surgeon: Chancy Milroy, MD;  Location:  [facility];  Service: Gynecology;  Laterality: Right;     OB History    Gravida  3   Para  1   Term  1   Preterm      AB  2   Living  1     SAB      IAB  1   Ectopic  1   Multiple      Live Births              Family History  Problem Relation Age of Onset  . Cancer Mother   . Hypertension Mother   . Diabetes Mother   . Breast cancer Mother        diagnosed in her 27's  . Stroke Father   . Breast cancer Cousin     Social History   Tobacco Use  . Smoking status: Former Smoker    Packs/day: 0.50    Years: 29.00    Pack years: 14.50     Types: Cigarettes    Quit date: 11/25/2018    Years since quitting: 1.5  . Smokeless tobacco: Never Used  Vaping Use  . Vaping Use: Former  Substance Use Topics  . Alcohol use: Yes    Alcohol/week: 3.0 standard drinks    Types: 3 Cans of beer per week    Comment: Occasional Beer   . Drug use: No    Types: "Crack" cocaine, Cocaine    Comment: last 6 months ago.    Home Medications Prior to Admission medications   Medication Sig Start Date End Date Taking? Authorizing Provider  acetaminophen-codeine (TYLENOL #3) 300-30 MG tablet Take 1 tablet by mouth 2 (two) times daily as needed for moderate pain. 04/20/19   Marybelle Killings, MD  albuterol (VENTOLIN HFA) 108 (90 Base) MCG/ACT inhaler INHALE 2 PUFFS BY MOUTH EVERY 6 HOURS AS NEEDED FOR COUGHING, WHEEZING, OR SHORTNESS OF BREATH 02/16/20   Harvie Heck, MD  amLODipine (NORVASC) 10 MG tablet TAKE 1 TABLET BY MOUTH EVERY DAY 02/10/20   Jeralyn Bennett, MD  cyclobenzaprine (FLEXERIL) 10 MG tablet TAKE 1 TABLET BY MOUTH TWICE A DAY AS NEEDED FOR MUSCLE SPASMS 02/11/20   Harvie Heck, MD  divalproex (DEPAKOTE ER) 500 MG 24 hr tablet Take 1 tablet (500 mg total) by mouth daily. 04/01/20   Corena Herter, PA-C  gabapentin (NEURONTIN) 300 MG capsule TAKE 1 CAPSULE BY MOUTH EVERYDAY AT BEDTIME 04/11/20   Agyei, Caprice Kluver, MD  hydrochlorothiazide (HYDRODIURIL) 25 MG tablet TAKE 1 TABLET BY MOUTH EVERY DAY 02/12/20   Mosetta Anis, MD  losartan (COZAAR) 25 MG tablet TAKE 2 TABLETS BY MOUTH EVERY DAY 12/11/19   Jose Persia, MD  Magnesium Gluconate 250 MG TABS Take 1 tablet (250 mg total) by mouth 2 (two) times daily between meals as needed (migraine). 04/01/20   Corena Herter, PA-C  methylPREDNISolone (MEDROL) 4 MG tablet 6-day taper to be taken as directed 09/10/19   Lanae Crumbly, PA-C  nicotine (NICODERM CQ - DOSED IN MG/24 HR) 7 mg/24hr patch Place 7 mg onto the skin daily.    [provider]  pantoprazole (PROTONIX) 40 MG tablet Take 1  tablet (40 mg total) by mouth daily. 04/07/20   Asencion Noble, MD  sertraline (ZOLOFT) 100 MG tablet Take 1 tablet (100 mg total) by mouth daily. 04/07/20   Asencion Noble, MD  SYMBICORT 80-4.5 MCG/ACT inhaler TAKE 2 PUFFS BY MOUTH TWICE A DAY 11/12/19   Aslam,  Loralyn Freshwater, MD  tetrahydrozoline 0.05 % ophthalmic solution Place 1 drop into both eyes daily as needed (for dry eyes).     [provider]    Allergies    Lisinopril  Review of Systems   Review of Systems Ten systems are reviewed and are negative for acute change except as noted in the HPI  Physical Exam Updated Vital Signs BP (!) 156/105   Pulse 65   Temp (!) 97.5 F (36.4 C) (Oral)   Resp (!) 22   LMP  (LMP Unknown)   SpO2 100%   Physical Exam Constitutional:      General: She is not in acute distress.    Appearance: Normal appearance. She is well-developed. She is not ill-appearing or diaphoretic.  HENT:     Head: Normocephalic and atraumatic.  Eyes:     General: Vision grossly intact. Gaze aligned appropriately.     Pupils: Pupils are equal, round, and reactive to light.  Neck:     Trachea: Trachea and phonation normal.  Pulmonary:     Effort: Pulmonary effort is normal. No respiratory distress.  Abdominal:     General: There is no distension.     Palpations: Abdomen is soft.     Tenderness: There is no abdominal tenderness. There is no guarding or rebound.  Musculoskeletal:        General: Normal range of motion.     Cervical back: Normal range of motion.     Comments: Left hip: Well-healed surgical scar present.  Some increased pain with flexion and extension, no overlying skin changes.  Good range of motion at the knee and ankle without pain.  Capillary refill and sensation intact to all toes.  Strong equal pedal pulses.  Compartments soft. - No midline C/T/L spinal tenderness to palpation, no paraspinal muscle tenderness, no deformity, crepitus, or step-off noted. No sign of injury to the neck or  back.  Skin:    General: Skin is warm and dry.  Neurological:     Mental Status: She is alert.     GCS: GCS eye subscore is 4. GCS verbal subscore is 5. GCS motor subscore is 6.     Comments: Speech is clear and goal oriented, follows commands Major Cranial nerves without deficit, no facial droop Moves extremities without ataxia, coordination intact  Psychiatric:        Behavior: Behavior normal.     ED Results / Procedures / Treatments   Labs (all labs ordered are listed, but only abnormal results are displayed) Labs Reviewed  COMPREHENSIVE METABOLIC PANEL - Abnormal; Notable for the following components:      Result Value   Creatinine, Ser 1.19 (*)    GFR, Estimated 53 (*)    All other components within normal limits  CBC  LACTIC ACID, PLASMA  LACTIC ACID, PLASMA    EKG None  Radiology DG Hip Unilat W or Wo Pelvis 2-3 Views Left  Result Date: 06/30/2020 CLINICAL DATA:  Pain EXAM: DG HIP (WITH OR WITHOUT PELVIS) 2-3V LEFT COMPARISON:  April 23, 2018 FINDINGS: Frontal pelvis as well as frontal and lateral left hip images were obtained. Patient is status post total hip replacement on the left with prosthetic components well-seated. No fracture or dislocation. There is mild narrowing of the right hip joint. There is bony overgrowth along the superolateral aspect of each acetabulum. IMPRESSION: Status post total hip replacement on the left with prosthetic components well-seated. No fracture or dislocation. Slight narrowing right hip joint. Bony  overgrowth along each superolateral acetabulum potentially places patient at increased risk femoroacetabular impingement. Electronically Signed   By: Lowella Grip III M.D.   On: 06/30/2020 13:28    Procedures Procedures   Medications Ordered in ED Medications - No data to display  ED Course  I have reviewed the triage vital signs and the nursing notes.  Pertinent labs & imaging results that were available during my care of  the patient were reviewed by me and considered in my medical decision making (see chart for details).    MDM Rules/Calculators/A&P                         Additional history obtained from: 1. Nursing notes from this visit. 2. Review of EMR.  Reviewed urgent care note from today, previous providers MDM shows concern for DVT and requesting imaging of both the knee and the hip. -------------- I reviewed and interpreted labs which include: CBC without leukocytosis to suggest infectious process.  No anemia. Lactic within normal limits, reassuring. CMP shows baseline creatinine at 1.19, no emergent electrolyte derangement, AKI, LFT elevations or gap  DG Pelvis:  IMPRESSION:  Status post total hip replacement on the left with prosthetic  components well-seated. No fracture or dislocation. Slight narrowing  right hip joint. Bony overgrowth along each superolateral acetabulum  potentially places patient at increased risk femoroacetabular  impingement.   On assessment patient is stable no acute distress increased pain with range of motion of the left hip and with palpation.  I have ordered DVT study as well as x-ray of the left knee, suspect left knee to be referred pain from the hip at this point.  There is no evidence for septic arthritis, compartment syndrome, cellulitis or neurovascular compromise.  Care handoff given to Domenic Moras PA-C at shift change, plan of care is to follow-up on DVT study and left knee x-ray, final disposition per oncoming team.     Note: Portions of this report may have been transcribed using voice recognition software. Every effort was made to ensure accuracy; however, inadvertent computerized transcription errors may still be present. Final Clinical Impression(s) / ED Diagnoses Final diagnoses:  None    Rx / DC Orders ED Discharge Orders    None       Gari Crown 06/30/20 1453    Lennice Sites, DO 06/30/20 1639

## 2020-06-30 NOTE — Discharge Instructions (Addendum)
Please go directly to the emergency room for further evaluation.

## 2020-06-30 NOTE — ED Provider Notes (Signed)
Patient signed out to me by previous provider, please see his note for complete H&P.  Patient sent here from urgent care for evaluation of left hip and leg pain for about 2 weeks.  She was sent here to have a DVT study to rule out DVT.  X-ray of the left hip was unremarkable, fortunately DVT study is negative.  On examinations of her left leg, no obvious signs of cellulitis she is neurovascular intact she is able to range her leg appropriately.  She does have some tenderness to her popliteal fossa and her superior patellar region but I have low suspicion for fracture or dislocation.  X-ray of her left knee did shows moderate knee joint effusion without any fracture or dislocation.  Knee exam is not consistent with septic joint.  Knee sleeve provided.  RICE therapy discussed.  Encourage patient to follow-up with her primary care doctor for further care.  Will also provide referral to orthopedic for further care.  BP (!) 156/105   Pulse 65   Temp (!) 97.5 F (36.4 C) (Oral)   Resp (!) 22   LMP  (LMP Unknown)   SpO2 100%   Results for orders placed or performed during the hospital encounter of 06/30/20  CBC  Result Value Ref Range   WBC 5.5 4.0 - 10.5 K/uL   RBC 3.89 3.87 - 5.11 MIL/uL   Hemoglobin 12.3 12.0 - 15.0 g/dL   HCT 38.7 36.0 - 46.0 %   MCV 99.5 80.0 - 100.0 fL   MCH 31.6 26.0 - 34.0 pg   MCHC 31.8 30.0 - 36.0 g/dL   RDW 14.2 11.5 - 15.5 %   Platelets 335 150 - 400 K/uL   nRBC 0.0 0.0 - 0.2 %  Comprehensive metabolic panel  Result Value Ref Range   Sodium 138 135 - 145 mmol/L   Potassium 4.6 3.5 - 5.1 mmol/L   Chloride 105 98 - 111 mmol/L   CO2 24 22 - 32 mmol/L   Glucose, Bld 82 70 - 99 mg/dL   BUN 15 6 - 20 mg/dL   Creatinine, Ser 1.19 (H) 0.44 - 1.00 mg/dL   Calcium 9.6 8.9 - 10.3 mg/dL   Total Protein 7.8 6.5 - 8.1 g/dL   Albumin 3.6 3.5 - 5.0 g/dL   AST 17 15 - 41 U/L   ALT 11 0 - 44 U/L   Alkaline Phosphatase 75 38 - 126 U/L   Total Bilirubin 0.4 0.3 - 1.2 mg/dL    GFR, Estimated 53 (L) >60 mL/min   Anion gap 9 5 - 15  Lactic acid, plasma  Result Value Ref Range   Lactic Acid, Venous 0.8 0.5 - 1.9 mmol/L   DG Knee Complete 4 Views Left  Result Date: 06/30/2020 CLINICAL DATA:  Pain EXAM: LEFT KNEE - COMPLETE 4+ VIEW COMPARISON:  None. FINDINGS: Frontal, lateral, and bilateral oblique views were obtained. There is no fracture or dislocation. There is a moderate joint effusion. There is no appreciable joint space narrowing or erosion. IMPRESSION: Moderate knee joint effusion. No fracture or dislocation. No appreciable joint space narrowing or erosion. Electronically Signed   By: Lowella Grip III M.D.   On: 06/30/2020 15:38   DG Hip Unilat W or Wo Pelvis 2-3 Views Left  Result Date: 06/30/2020 CLINICAL DATA:  Pain EXAM: DG HIP (WITH OR WITHOUT PELVIS) 2-3V LEFT COMPARISON:  April 23, 2018 FINDINGS: Frontal pelvis as well as frontal and lateral left hip images were obtained. Patient is status  post total hip replacement on the left with prosthetic components well-seated. No fracture or dislocation. There is mild narrowing of the right hip joint. There is bony overgrowth along the superolateral aspect of each acetabulum. IMPRESSION: Status post total hip replacement on the left with prosthetic components well-seated. No fracture or dislocation. Slight narrowing right hip joint. Bony overgrowth along each superolateral acetabulum potentially places patient at increased risk femoroacetabular impingement. Electronically Signed   By: Lowella Grip III M.D.   On: 06/30/2020 13:28   VAS [place] LOWER EXTREMITY VENOUS (DVT) ([facility] and [facility] 7a-7p)  Result Date: 06/30/2020  Lower Venous DVT Study Indications: Pain, and Swelling.  Risk Factors: Surgery Hip surgery. Comparison Study: No previous Performing Technologist: Vonzell Schlatter RVT  Examination Guidelines: A complete evaluation includes B-mode imaging, spectral Doppler, color Doppler, and power Doppler as needed of all  accessible portions of each vessel. Bilateral testing is considered an integral part of a complete examination. Limited examinations for reoccurring indications may be performed as noted. The reflux portion of the exam is performed with the patient in reverse Trendelenburg.  +-----+---------------+---------+-----------+----------+--------------+ RIGHTCompressibilityPhasicitySpontaneityPropertiesThrombus Aging +-----+---------------+---------+-----------+----------+--------------+ CFV  Full           Yes      Yes                                 +-----+---------------+---------+-----------+----------+--------------+ SFJ  Full                                                        +-----+---------------+---------+-----------+----------+--------------+   +---------+---------------+---------+-----------+----------+--------------+ LEFT     CompressibilityPhasicitySpontaneityPropertiesThrombus Aging +---------+---------------+---------+-----------+----------+--------------+ CFV      Full           Yes      Yes                                 +---------+---------------+---------+-----------+----------+--------------+ SFJ      Full                                                        +---------+---------------+---------+-----------+----------+--------------+ FV Prox  Full                                                        +---------+---------------+---------+-----------+----------+--------------+ FV Mid   Full                                                        +---------+---------------+---------+-----------+----------+--------------+ FV DistalFull                                                        +---------+---------------+---------+-----------+----------+--------------+  PFV      Full                                                        +---------+---------------+---------+-----------+----------+--------------+ POP      Full            Yes      Yes                                 +---------+---------------+---------+-----------+----------+--------------+ PTV      Full                                                        +---------+---------------+---------+-----------+----------+--------------+ PERO     Full                                                        +---------+---------------+---------+-----------+----------+--------------+   Summary: RIGHT: - No evidence of common femoral vein obstruction.  LEFT: - There is no evidence of deep vein thrombosis in the lower extremity.  - No cystic structure found in the popliteal fossa.  *See table(s) above for measurements and observations.    Preliminary       Domenic Moras, PA-C 06/30/20 1620    Horton, Alvin Critchley, DO 07/01/20 0010

## 2020-06-30 NOTE — ED Notes (Signed)
Pt d/c by MD and is provided w/ d/c instructions and follow up care, Pt is out of ED ambulatory by self

## 2020-06-30 NOTE — ED Triage Notes (Signed)
Pt presents with left knee swelling and left hip pain x 2 weeks. Pain improve with rest and elevation of the left leg. States she had a left hip replacement and never healed properly.

## 2020-06-30 NOTE — Progress Notes (Signed)
Left lower extremity venous study completed.     Please see CV Proc for preliminary results.   Vonzell Schlatter, RVT

## 2020-06-30 NOTE — ED Triage Notes (Signed)
Pt sent down by UC for hip swelling and pain times 2 weeks ,

## 2020-06-30 NOTE — Progress Notes (Signed)
Orthopedic Tech Progress Note Patient Details:  Jean [name] 1966/01/13 [id number]  Ortho Devices Type of Ortho Device: Knee Sleeve Ortho Device/Splint Location: LLE Ortho Device/Splint Interventions: Adjustment,Application,Ordered   Post Interventions Patient Tolerated: Well Instructions Provided: Adjustment of device   Jean [name] 06/30/2020, 4:54 PM

## 2020-07-26 ENCOUNTER — Encounter: Payer: Self-pay | Admitting: Orthopaedic Surgery

## 2020-07-26 ENCOUNTER — Ambulatory Visit (INDEPENDENT_AMBULATORY_CARE_PROVIDER_SITE_OTHER): Payer: Medicaid Other | Admitting: Orthopaedic Surgery

## 2020-07-26 DIAGNOSIS — M65962 Unspecified synovitis and tenosynovitis, left lower leg: Secondary | ICD-10-CM | POA: Insufficient documentation

## 2020-07-26 DIAGNOSIS — M659 Synovitis and tenosynovitis, unspecified: Secondary | ICD-10-CM | POA: Diagnosis not present

## 2020-07-26 DIAGNOSIS — M25562 Pain in left knee: Secondary | ICD-10-CM | POA: Diagnosis not present

## 2020-07-26 MED ORDER — BUPIVACAINE HCL 0.5 % IJ SOLN
3.0000 mL | INTRAMUSCULAR | Status: AC | PRN
  Administered 2020-07-26: 3 mL via INTRA_ARTICULAR

## 2020-07-26 MED ORDER — LIDOCAINE HCL 1 % IJ SOLN
0.5000 mL | INTRAMUSCULAR | Status: AC | PRN
  Administered 2020-07-26: .5 mL

## 2020-07-26 MED ORDER — METHYLPREDNISOLONE ACETATE 40 MG/ML IJ SUSP
40.0000 mg | INTRAMUSCULAR | Status: AC | PRN
  Administered 2020-07-26: 40 mg via INTRA_ARTICULAR

## 2020-07-26 NOTE — Progress Notes (Signed)
Office Visit Note   Patient: Jean [name]           Date of Birth: February 08, 1966           MRN: [id number] Visit Date: 07/26/2020              Requested by: Jose Persia, MD [address] [place],  [place] [address] PCP: Jose Persia, MD   Assessment & Plan: Visit Diagnoses:  1. Synovitis of left knee     Plan: Aspiration of her left knee showed clear yellow fluid 30 cc with some cartilage fragment debris.  No cloudiness that was suggest rheumatologic condition.  Cortisone injection performed.  She tolerated the injection well.  Recheck 4 weeks.  Follow-Up Instructions: Return in about 4 weeks (around 08/23/2020).   Orders:  Orders Placed This Encounter  Procedures  . Large Joint Inj   No orders of the defined types were placed in this encounter.     Procedures: Large Joint Inj: L knee on 07/26/2020 10:03 AM Indications: joint swelling and pain Details: 22 G 1.5 in needle, anterolateral approach  Arthrogram: No  Medications: 0.5 mL lidocaine 1 %; 3 mL bupivacaine 0.5 %; 40 mg methylPREDNISolone acetate 40 MG/ML Aspirate: 30 mL clear and yellow Outcome: tolerated well, no immediate complications Procedure, treatment alternatives, risks and benefits explained, specific risks discussed. Consent was given by the patient. Immediately prior to procedure a time out was called to verify the correct patient, procedure, equipment, support staff and site/side marked as required. Patient was prepped and draped in the usual sterile fashion.       Clinical Data: No additional findings.   Subjective: Chief Complaint  Patient presents with  . Left Knee - Pain    HPI 54 year old female seen with left knee swelling after being seen in the emergency room on 06/30/2020.  X-rays were negative for arthritis but did show knee effusion.  Previous x-ray several years ago were also negative with no knee effusion.  She has had some catching in her knee swelling with  ambulation.  Previous total hip arthroplasty on the left done in 2017.  Patient has used a walker for many years then worked her way to a cane.  She has been through therapy for her hip but not for her knee.  She denies any other rheumatologic conditions.  No fever chills no history of gout.  Review of Systems positive chronic kidney disease GERD COPD, previous left total hip arthroplasty, depression all other systems noncontributory to HPI.   Objective: Vital Signs: BP (!) 150/98   Pulse 61   Ht 5\' 4"  (1.626 m)   Wt 144 lb (65.3 kg)   LMP  (LMP Unknown)   BMI 24.72 kg/m   Physical Exam Constitutional:      Appearance: She is well-developed.  HENT:     Head: Normocephalic.     Right Ear: External ear normal.     Left Ear: External ear normal.  Eyes:     Pupils: Pupils are equal, round, and reactive to light.  Neck:     Thyroid: No thyromegaly.     Trachea: No tracheal deviation.  Cardiovascular:     Rate and Rhythm: Normal rate.  Pulmonary:     Effort: Pulmonary effort is normal.  Abdominal:     Palpations: Abdomen is soft.  Skin:    General: Skin is warm and dry.  Neurological:     Mental Status: She is alert and oriented  to person, place, and time.  Psychiatric:        Behavior: Behavior normal.     Ortho Exam normal hip range of motion leg lengths are equal.  There is 3+ left knee effusion.  No increased warmth.  Crepitus with knee range of motion. Specialty Comments:  No specialty comments available.  Imaging: No results found.   PMFS History: Patient Active Problem List   Diagnosis Date Noted  . Synovitis of left knee 07/26/2020  . COVID-19 virus infection 05/11/2020  . Low back pain 11/24/2019  . Impingement syndrome of right shoulder 04/22/2019  . History of dental surgery 02/05/2019  . COPD GOLD 0 ? AB  12/06/2018  . 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 GERD (gastroesophageal reflux disease)   . Breast pain 06/23/2018   . Dyspareunia in female 09/13/2017  . Cervicalgia 04/24/2017  . CKD (chronic kidney disease) 10/29/2016  . Chronic pain syndrome 05/10/2016  . MDD (major depressive disorder) 10/19/2015  . Dyspnea on exertion 08/22/2015  . HTN (hypertension) 07/08/2015  . Routine health maintenance 07/08/2015  . Tobacco abuse 07/08/2015   Past Medical History:  Diagnosis Date  . Adnexal mass 2019   likely remnant fibroid on broad ligament; getting diagnostic lap  . Anxiety   . Arthritis    hip  . Asthma   . Carpal tunnel syndrome    bilateral  . Chest wall pain 10/15/2016  . Chronic lower back pain   . Depression   . Dyspnea    with exertion  . GERD (gastroesophageal reflux disease)   . Headache    migraines  . Herpes   . Hypertension   . Liver lesion, right lobe 12/01/2015   CT chest without contrast 11/30/15 with hepatic lesions measuring 2.0 x 1.7 cm of the right lobe and 1 x 1 cm on the dome. CT abd/pel 8/18: 1.3cm inferior R liver lobe, prob benign but recommend 3-46mo f/u with MRI w/wo IV contrast with attenuation MRI 6/19: combination of benign cavernous hemangiomas and small benign cysts  . Poor dental hygiene    chipped upper front and several loose teeth  . Substance abuse ([place])    cocaine/crack cocaine - last use 02/07/18  . SVD (spontaneous vaginal delivery)    x 1  . Uses walker    for ambulation  . Uterine leiomyoma 02/16/2016   s/p vaginal hysterectomy 2018  . Vaginal discharge 03/26/2018    Family History  Problem Relation Age of Onset  . Cancer Mother   . Hypertension Mother   . Diabetes Mother   . Breast cancer Mother        diagnosed in her 46's  . Stroke Father   . Breast cancer Cousin     Past Surgical History:  Procedure Laterality Date  . LAPAROSCOPIC OVARIAN CYSTECTOMY Right 02/25/2018   Procedure: LAPAROSCOPIC REMOVAL OF RIGHT ADNEXAL MASS;  Surgeon: Chancy Milroy, MD;  Location: [facility];  Service: Gynecology;  Laterality: Right;  . SALPINGECTOMY Left     lap - ectopic preg  . TOTAL HIP ARTHROPLASTY Left 01/20/2016   Procedure: LEFT TOTAL HIP ARTHROPLASTY ANTERIOR APPROACH;  Surgeon: Mcarthur Rossetti, MD;  Location: [facility];  Service: Orthopedics;  Laterality: Left;  . TUBAL LIGATION    . VAGINAL HYSTERECTOMY Right 12/11/2016   Procedure: HYSTERECTOMY VAGINAL WITH RIGHT SALPINGECTOMY;  Surgeon: Chancy Milroy, MD;  Location: [facility];  Service: Gynecology;  Laterality: Right;   Social History  Occupational History  . Not on file  Tobacco Use  . Smoking status: Former Smoker    Packs/day: 0.50    Years: 29.00    Pack years: 14.50    Types: Cigarettes    Quit date: 11/25/2018    Years since quitting: 1.6  . Smokeless tobacco: Never Used  Vaping Use  . Vaping Use: Former  Substance and Sexual Activity  . Alcohol use: Yes    Alcohol/week: 3.0 standard drinks    Types: 3 Cans of beer per week    Comment: Occasional Beer   . Drug use: No    Types: "Crack" cocaine, Cocaine    Comment: last 6 months ago.  Marland Kitchen Sexual activity: Yes    Partners: Male    Birth control/protection: None, Post-menopausal    Comment: Hysterectomy

## 2020-08-23 ENCOUNTER — Telehealth: Payer: Self-pay | Admitting: Orthopaedic Surgery

## 2020-08-23 ENCOUNTER — Ambulatory Visit: Payer: Medicaid Other | Admitting: Orthopaedic Surgery

## 2020-08-23 DIAGNOSIS — M659 Synovitis and tenosynovitis, unspecified: Secondary | ICD-10-CM

## 2020-08-23 NOTE — Telephone Encounter (Signed)
Patient called advised she is not feeling well today and can not make her appointment. Patient asked if she can be set up for an MRI because her left knee is swollen again. Patient said the cortisone injection did not work. The number to contact patient is [phone]

## 2020-08-23 NOTE — Telephone Encounter (Signed)
OK proceed with MRI request. Thanks ROV after scan. Hope they approve without a new OV note.

## 2020-08-23 NOTE — Telephone Encounter (Signed)
Please advise 

## 2020-08-23 NOTE — Telephone Encounter (Signed)
MRI ordered. Tried calling patient to advise. No answer.

## 2020-09-01 ENCOUNTER — Telehealth: Payer: Self-pay | Admitting: Orthopaedic Surgery

## 2020-09-01 NOTE — Telephone Encounter (Signed)
Talked to pt relative and he states he will have the pt call me back today 09/01/2020.

## 2020-09-06 ENCOUNTER — Other Ambulatory Visit: Payer: Self-pay | Admitting: Internal Medicine

## 2020-09-06 ENCOUNTER — Other Ambulatory Visit: Payer: Self-pay | Admitting: Student

## 2020-09-06 DIAGNOSIS — F4321 Adjustment disorder with depressed mood: Secondary | ICD-10-CM

## 2020-09-06 DIAGNOSIS — M25559 Pain in unspecified hip: Secondary | ICD-10-CM

## 2020-09-06 DIAGNOSIS — I10 Essential (primary) hypertension: Secondary | ICD-10-CM

## 2020-09-07 ENCOUNTER — Other Ambulatory Visit: Payer: Self-pay

## 2020-09-08 MED ORDER — GABAPENTIN 300 MG PO CAPS: ORAL_CAPSULE | ORAL | 1 refills | Status: DC

## 2020-09-12 ENCOUNTER — Inpatient Hospital Stay: Admission: RE | Admit: 2020-09-12 | Payer: Medicaid Other | Source: Ambulatory Visit

## 2020-09-12 ENCOUNTER — Other Ambulatory Visit: Payer: Medicaid Other

## 2020-09-13 ENCOUNTER — Telehealth: Payer: Self-pay | Admitting: Orthopaedic Surgery

## 2020-09-13 ENCOUNTER — Other Ambulatory Visit: Payer: Medicaid Other

## 2020-09-13 NOTE — Telephone Encounter (Signed)
Patient called advised she is having her MRI on 09/15/2020. Patient asked if she could be called with the results if possible. The number to contact patient is [phone]

## 2020-09-14 ENCOUNTER — Ambulatory Visit: Payer: Medicaid Other | Admitting: Orthopaedic Surgery

## 2020-09-14 NOTE — Telephone Encounter (Signed)
I called discussed with her fiance. She can check on MY Chart if she wants of ROV to discuss.   ( [facility] last MRI appt )

## 2020-09-26 ENCOUNTER — Other Ambulatory Visit: Payer: Medicaid Other

## 2020-09-28 ENCOUNTER — Ambulatory Visit: Payer: Medicaid Other | Admitting: Orthopaedic Surgery

## 2020-09-28 ENCOUNTER — Ambulatory Visit
Admission: RE | Admit: 2020-09-28 | Discharge: 2020-09-28 | Disposition: A | Discharge status: Home / Self Care | Payer: Medicaid Other | Source: Ambulatory Visit | Attending: Orthopaedic Surgery | Admitting: Orthopaedic Surgery

## 2020-09-28 ENCOUNTER — Other Ambulatory Visit: Payer: Self-pay

## 2020-09-28 DIAGNOSIS — M659 Synovitis and tenosynovitis, unspecified: Secondary | ICD-10-CM

## 2020-10-12 ENCOUNTER — Ambulatory Visit (INDEPENDENT_AMBULATORY_CARE_PROVIDER_SITE_OTHER): Payer: Medicaid Other | Admitting: Orthopaedic Surgery

## 2020-10-12 ENCOUNTER — Ambulatory Visit: Payer: Self-pay

## 2020-10-12 ENCOUNTER — Telehealth: Payer: Self-pay

## 2020-10-12 ENCOUNTER — Encounter: Payer: Self-pay | Admitting: Orthopaedic Surgery

## 2020-10-12 VITALS — BP 178/82 | HR 57 | Ht 63.0 in | Wt 141.6 lb

## 2020-10-12 DIAGNOSIS — M25562 Pain in left knee: Secondary | ICD-10-CM | POA: Diagnosis not present

## 2020-10-12 DIAGNOSIS — G8929 Other chronic pain: Secondary | ICD-10-CM | POA: Diagnosis not present

## 2020-10-12 DIAGNOSIS — M25551 Pain in right hip: Secondary | ICD-10-CM

## 2020-10-12 NOTE — Telephone Encounter (Signed)
Referral entered for [facility] PT.

## 2020-10-12 NOTE — Telephone Encounter (Signed)
Patient was seen today she is requesting pt to be scheduled in our office call [phone]

## 2020-10-17 NOTE — Progress Notes (Signed)
Office Visit Note   Patient: Jean [name]           Date of Birth: [birth year]           MRN: [id number] Visit Date: 10/12/2020              Requested by: Jose Persia, MD [address] [place],  [place] [address] PCP: Jose Persia, MD   Assessment & Plan: Visit Diagnoses:  1. Pain in right hip   2. Chronic pain of left knee     Plan: We will proceed with some therapy for quad strengthening as well as treatment of her patellofemoral lateral condyle impingement syndrome.  Possible iontophoresis etc. with therapy.  Recheck 6 weeks.  Follow-Up Instructions: Return in about 6 weeks (around 11/23/2020).   Orders:  Orders Placed This Encounter  Procedures   XR HIP UNILAT W OR W/O PELVIS 2-3 VIEWS RIGHT   Ambulatory referral to Physical Therapy   No orders of the defined types were placed in this encounter.     Procedures: No procedures performed   Clinical Data: No additional findings.   Subjective: Chief Complaint  Patient presents with   Left Knee - Follow-up, Pain    MRI review   Right Hip - Pain    HPI 55 year old female returns with continued left knee pain gradually getting worse.  Patient is noted knots in her knee and states she has to use a rolling walker and when she bends down she has trouble getting back up to an upright position.  She has noticed weakness in her left leg and not able to use a cane.  MRI scan 09/29/2020 left knee showed intrasubstance degeneration medial lateral meniscus without definite tear.  Some moderate chondrosis medial lateral compartment.  Bony edema of the subchondral medial femoral condyle.  Findings of patellar tendon/lateral femoral condyle impingement syndrome with edema.  Incidental proximal fibular chondroid lesion and radiologist recommended follow-up plain radiographs in 3 months.  Review of Systems updated unchanged.   Objective: Vital Signs: BP (!) 178/82   Pulse (!) 57   Ht 5\' 3"  (1.6 m)   Wt 141  lb 9.6 oz (64.2 kg)   LMP  (LMP Unknown)   BMI 25.08 kg/m   Physical Exam Constitutional:      Appearance: She is well-developed.  HENT:     Head: Normocephalic.     Right Ear: External ear normal.     Left Ear: External ear normal. There is no impacted cerumen.  Eyes:     Pupils: Pupils are equal, round, and reactive to light.  Neck:     Thyroid: No thyromegaly.     Trachea: No tracheal deviation.  Cardiovascular:     Rate and Rhythm: Normal rate.  Pulmonary:     Effort: Pulmonary effort is normal.  Abdominal:     Palpations: Abdomen is soft.  Musculoskeletal:     Cervical back: No rigidity.  Skin:    General: Skin is warm and dry.  Neurological:     Mental Status: She is alert and oriented to person, place, and time.  Psychiatric:        Behavior: Behavior normal.    Ortho Exam patient has significant left quad weakness tenderness over the patellofemoral lateral condyle noted with flexion extension.  Collateral ligaments are stable.  Specialty Comments:  No specialty comments available.  Imaging: No results found.   PMFS History: Patient Active Problem List   Diagnosis Date Noted  Synovitis of left knee 07/26/2020   COVID-19 virus infection 05/11/2020   Low back pain 11/24/2019   Impingement syndrome of right shoulder 04/22/2019   History of dental surgery 02/05/2019   COPD GOLD 0 ? 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   GERD (gastroesophageal reflux disease)    Breast pain 06/23/2018   Dyspareunia in female 09/13/2017   Cervicalgia 04/24/2017   CKD (chronic kidney disease) 10/29/2016   Chronic pain syndrome 05/10/2016   MDD (major depressive disorder) 10/19/2015   Dyspnea on exertion 08/22/2015   HTN (hypertension) 07/08/2015   Routine health maintenance 07/08/2015   Tobacco abuse 07/08/2015   Past Medical History:  Diagnosis Date   Adnexal mass 2019   likely remnant fibroid on broad ligament; getting  diagnostic lap   Anxiety    Arthritis    hip   Asthma    Carpal tunnel syndrome    bilateral   Chest wall pain 10/15/2016   Chronic lower back pain    Depression    Dyspnea    with exertion   GERD (gastroesophageal reflux disease)    Headache    migraines   Herpes    Hypertension    Liver lesion, right lobe 12/01/2015   CT chest without contrast 11/30/15 with hepatic lesions measuring 2.0 x 1.7 cm of the right lobe and 1 x 1 cm on the dome. CT abd/pel 8/18: 1.3cm inferior R liver lobe, prob benign but recommend 3-94mo f/u with MRI w/wo IV contrast with attenuation MRI 6/19: combination of benign cavernous hemangiomas and small benign cysts   Poor dental hygiene    chipped upper front and several loose teeth   Substance abuse ([place])    cocaine/crack cocaine - last use 02/07/18   SVD (spontaneous vaginal delivery)    x 1   Uses walker    for ambulation   Uterine leiomyoma 02/16/2016   s/p vaginal hysterectomy 2018   Vaginal discharge 03/26/2018    Family History  Problem Relation Age of Onset   Cancer Mother    Hypertension Mother    Diabetes Mother    Breast cancer Mother        diagnosed in her 22's   Stroke Father    Breast cancer Cousin     Past Surgical History:  Procedure Laterality Date   LAPAROSCOPIC OVARIAN CYSTECTOMY Right 02/25/2018   Procedure: LAPAROSCOPIC REMOVAL OF RIGHT ADNEXAL MASS;  Surgeon: Chancy Milroy, MD;  Location: [facility];  Service: Gynecology;  Laterality: Right;   SALPINGECTOMY Left    lap - ectopic preg   TOTAL HIP ARTHROPLASTY Left 01/20/2016   Procedure: LEFT TOTAL HIP ARTHROPLASTY ANTERIOR APPROACH;  Surgeon: Mcarthur Rossetti, MD;  Location: [facility];  Service: Orthopedics;  Laterality: Left;   TUBAL LIGATION     VAGINAL HYSTERECTOMY Right 12/11/2016   Procedure: HYSTERECTOMY VAGINAL WITH RIGHT SALPINGECTOMY;  Surgeon: Chancy Milroy, MD;  Location: [facility];  Service: Gynecology;  Laterality: Right;   Social History   Occupational  History   Not on file  Tobacco Use   Smoking status: Former    Packs/day: 0.50    Years: 29.00    Pack years: 14.50    Types: Cigarettes    Quit date: 11/25/2018    Years since quitting: 1.8   Smokeless tobacco: Never  Vaping Use   Vaping Use: Former  Substance and Sexual Activity   Alcohol use: Yes  Alcohol/week: 3.0 standard drinks    Types: 3 Cans of beer per week    Comment: Occasional Beer    Drug use: No    Types: "Crack" cocaine, Cocaine    Comment: last 6 months ago.   Sexual activity: Yes    Partners: Male    Birth control/protection: None, Post-menopausal    Comment: Hysterectomy

## 2020-10-26 ENCOUNTER — Other Ambulatory Visit: Payer: Self-pay | Admitting: Internal Medicine

## 2020-11-01 ENCOUNTER — Ambulatory Visit: Payer: Medicaid Other | Admitting: Physical Therapy

## 2020-11-09 ENCOUNTER — Ambulatory Visit: Payer: Medicaid Other

## 2020-11-16 ENCOUNTER — Ambulatory Visit: Payer: Medicaid Other | Attending: Orthopaedic Surgery

## 2020-11-16 ENCOUNTER — Other Ambulatory Visit: Payer: Self-pay

## 2020-11-16 VITALS — BP 137/80 | HR 54

## 2020-11-16 DIAGNOSIS — G8929 Other chronic pain: Secondary | ICD-10-CM

## 2020-11-16 DIAGNOSIS — M25562 Pain in left knee: Secondary | ICD-10-CM | POA: Diagnosis not present

## 2020-11-16 DIAGNOSIS — R2681 Unsteadiness on feet: Secondary | ICD-10-CM

## 2020-11-16 DIAGNOSIS — M6281 Muscle weakness (generalized): Secondary | ICD-10-CM

## 2020-11-16 DIAGNOSIS — M25551 Pain in right hip: Secondary | ICD-10-CM | POA: Diagnosis present

## 2020-11-16 NOTE — Therapy (Signed)
[place], [place], [address] Phone: [phone]   Fax:  [phone]  Physical Therapy Evaluation  Patient Details  Name: Jean [name] MRN: [id number] Date of Birth: [birth year] Referring Provider (PT): Marybelle Killings   Encounter Date: 11/16/2020   PT End of Session - 11/16/20 1323     Visit Number 1    Number of Visits 17    Date for PT Re-Evaluation 01/11/21    Authorization Type UHC MCR and MCD, No FOTO    Authorization Time Period 27 Visit Limit    Progress Note Due on Visit 10    PT Start Time 1130    PT Stop Time 1215    PT Time Calculation (min) 45 min    Activity Tolerance Patient tolerated treatment well    Behavior During Therapy [facility] for tasks assessed/performed             Past Medical History:  Diagnosis Date   Adnexal mass 2019   likely remnant fibroid on broad ligament; getting diagnostic lap   Anxiety    Arthritis    hip   Asthma    Carpal tunnel syndrome    bilateral   Chest wall pain 10/15/2016   Chronic lower back pain    Depression    Dyspnea    with exertion   GERD (gastroesophageal reflux disease)    Headache    migraines   Herpes    Hypertension    Liver lesion, right lobe 12/01/2015   CT chest without contrast 11/30/15 with hepatic lesions measuring 2.0 x 1.7 cm of the right lobe and 1 x 1 cm on the dome. CT abd/pel 8/18: 1.3cm inferior R liver lobe, prob benign but recommend 3-55mo f/u with MRI w/wo IV contrast with attenuation MRI 6/19: combination of benign cavernous hemangiomas and small benign cysts   Poor dental hygiene    chipped upper front and several loose teeth   Substance abuse ([place])    cocaine/crack cocaine - last use 02/07/18   SVD (spontaneous vaginal delivery)    x 1   Uses walker    for ambulation   Uterine leiomyoma 02/16/2016   s/p vaginal hysterectomy 2018   Vaginal discharge 03/26/2018    Past Surgical History:  Procedure Laterality  Date   LAPAROSCOPIC OVARIAN CYSTECTOMY Right 02/25/2018   Procedure: LAPAROSCOPIC REMOVAL OF RIGHT ADNEXAL MASS;  Surgeon: Chancy Milroy, MD;  Location: [facility];  Service: Gynecology;  Laterality: Right;   SALPINGECTOMY Left    lap - ectopic preg   TOTAL HIP ARTHROPLASTY Left 01/20/2016   Procedure: LEFT TOTAL HIP ARTHROPLASTY ANTERIOR APPROACH;  Surgeon: Mcarthur Rossetti, MD;  Location: [facility];  Service: Orthopedics;  Laterality: Left;   TUBAL LIGATION     VAGINAL HYSTERECTOMY Right 12/11/2016   Procedure: HYSTERECTOMY VAGINAL WITH RIGHT SALPINGECTOMY;  Surgeon: Chancy Milroy, MD;  Location: [facility];  Service: Gynecology;  Laterality: Right;    Vitals:   11/16/20 1140  BP: 137/80  Pulse: (!) 54  SpO2: 96%      Subjective Assessment - 11/16/20 1141     Subjective Pt presents to PT with a rollator. She reports primary c/o L knee pain and swelling of insidious onset beginning over a year ago. She also reports R hip pain of insidious onset starting over a year ago. She has a history of BIL hip OA, L knee OA, and a L THA in 2017. Her primary aggravating  factors include standing, walking, prolonged sitting, squatting, and stairs. She reports constant popping in her L knee. She reports that the popping is not painful. She also describes her L knee getting "stuck," requiring her to manual "unstick" her knee using her hands. She reports that Aspercream and wearing a neoprene knee brace help to reduce her knee pain. She adds that she experiences daily morning stiffness lasting 10 minutes that improves with movement. She states that she also experiences occasional muscle spasms in her L lower leg. She also reports that she uses her rollator due to fatigue and SOB with locomotion. She denies any N/T in her BIL LE. She reports a 20-lb weight loss over the past 6 months that she attributes to losing her teeth a year ago. She reports eating much less than she was a year ago. She was instructed to  request a referral to nutrition at her upcoming doctor's appointment on 11/29/2020. Other cancer and cauda equina red flag screening questions are negative.    Limitations Sitting;Standing;Walking    How long can you sit comfortably? 10 minutes    How long can you stand comfortably? 5 minutes    How long can you walk comfortably? 2 minutes    Diagnostic tests 09/28/2020 L knee MRI: IMPRESSION:  Intrasubstance degeneration within the medial and lateral menisci  without definitive meniscus tear.     Moderate medial and mild lateral compartment chondrosis. Mild bony  edema in the subchondral medial femoral condyle.     Findings of patellar tendon-lateral femoral condyle impingement  syndrome.     Chondroid lesion with benign features in the proximal fibula  measuring 2.2 cm. Recommend follow-up knee radiograph in 3 months.     10/12/2020: R hip X-ray: Impression: Right hip mild osteoarthritis, negative for acute changes.    Patient Stated Goals Walk without a rollator    Currently in Pain? Yes    Pain Score 8     Pain Location Hip    Pain Orientation Right    Pain Descriptors / Indicators Sharp;Aching    Pain Type Chronic pain    Pain Onset More than a month ago    Pain Frequency Constant    Aggravating Factors  Squatting, standing, walking, prolonged sitting    Pain Relieving Factors Aspercreama, knee brace    Effect of Pain on Daily Activities Unable to do household ADL's                [facility] PT Assessment - 11/16/20 0001       Assessment   Medical Diagnosis Chronic pain of left knee (M25.562, G89.29)    Referring Provider (PT) Rodell Perna C    Onset Date/Surgical Date 11/17/19    Hand Dominance Right    Next MD Visit 11/29/2020    Prior Therapy Yes, for her knees several years ago.      Precautions   Precautions None      Restrictions   Weight Bearing Restrictions No      Balance Screen   Has the patient fallen in the past 6 months No    Has the patient had a decrease in  activity level because of a fear of falling?  No    Is the patient reluctant to leave their home because of a fear of falling?  No      Home Environment   Living Environment Private residence    Living Arrangements Spouse/significant other    Available Help at Discharge Family;Friend(s)  Type of [place] to enter    Entrance Stairs-Number of Steps 3    Entrance Stairs-Rails Left    Home Layout One level    Carpenter - 4 wheels;Walker - 2 wheels;Cane - quad      Prior Function   Level of Independence Independent    Vocation Unemployed      Cognition   Overall Cognitive Status Within Functional Limits for tasks assessed      Observation/Other Assessments   Observations Pt presents with rollator and antalgic gait pattern, Mild swelling noted at L knee joint; pt has pain at her R hip when laying on her R side      AROM   Right Knee Extension 0    Right Knee Flexion 135    Left Knee Extension 0    Left Knee Flexion 100   with pain     PROM   Right Knee Extension -5    Right Knee Flexion 140    Left Knee Extension -2   with pain   Left Knee Flexion 120   with intense pain     Strength   Right Hip Flexion 4-/5    Right Hip Extension 4-/5    Right Hip ABduction 4-/5    Left Hip Flexion 4-/5    Left Hip Extension 4-/5    Left Hip ABduction 4-/5    Right Knee Flexion 5/5    Right Knee Extension 5/5    Left Knee Flexion 5/5    Left Knee Extension 4+/5   with pain     Flexibility   Hamstrings WNL BIL    Quadriceps WNL BIL    ITB WNL BIL      Palpation   Patella mobility WNL mobility BIL with audible and palpable crepitus in all planes    Palpation comment TTP in L popliteal fossa along semitendinosus and semimembranosis tendons; no TTP to BIL joint line      Special Tests    Special Tests Knee Special Tests    Knee Special tests  Lateral Pull Sign;Patellofemoral Apprehension Test;Patellofemoral Grind Test (Clarke's Sign)       Lateral Pull Sign    Findings Positive    Side Left      Patellofemoral Apprehension Test    Findings Positive    Side  Left      Patellofemoral Grind test (Clark's Sign)   Findings Postive    Side  Left      other    Findings Negative    Side  Right;Left    Comments Ober's      other   findings Negative    Side Right;Left    Comments Ely's and Hamstring 90/90                        Objective measurements completed on examination: See above findings.               PT Education - 11/16/20 1322     Education Details Pt instructed on proper form and dosage of HEP, along with possible pathophysiology resulting in her knee pain.    Person(s) Educated Patient    Methods Explanation;Demonstration;Handout    Comprehension Verbalized understanding;Returned demonstration              PT Short Term Goals - 11/16/20 1335       PT SHORT TERM GOAL #1   Title Pt  will demonstrate understanding and regular adherence to her HEP in order to promote independence with the management of her primary impairments.    Baseline HEP given at baseline    Time 4    Period Weeks    Status New    Target Date 12/14/20      PT SHORT TERM GOAL #2   Title -    Baseline -               PT Long Term Goals - 11/16/20 1336       PT LONG TERM GOAL #1   Title Pt will demonstrate L knee AROM of 125 degrees or more with less than 4/10 pain in order to promote normal gait pattern.    Baseline 100 degrees    Time 8    Period Weeks    Status New    Target Date 01/11/21      PT LONG TERM GOAL #2   Title Pt will demonstrate BIL global hip strength of 5/5 in order to progress an independent LE strengthening regimen.    Baseline global hip strength 4-/5    Time 8    Period Weeks    Status New    Target Date 01/11/21      PT LONG TERM GOAL #3   Title Pt will report ability to stand >20 minutes without the need of a rest break due to discomfort in order to  perform tasks like washing dishes without limitation.    Baseline Pt unable to stand >10 minutes without significant discomfort requiring her to sit.    Time 8    Period Weeks    Status New    Target Date 01/11/21      PT LONG TERM GOAL #4   Title Pt will report ability to walk >15 minutes with a rollator without the need of a seated rest break in order to grocery shop without limitation.    Baseline pt unable to walk >2 minutes with a rollator due to fatigue.    Time 8    Period Weeks    Status New    Target Date 01/11/21      PT LONG TERM GOAL #5   Title Pt will report resting pain of <4/10 in order to promote independence in ADL's with minimal difficulty.    Baseline 8/10    Time 8    Period Weeks    Status New    Target Date 01/11/21                    Plan - 11/16/20 1325     Clinical Impression Statement The pt is a pleasant 55yo F who presents with primary c/o L knee pain, along with R hip pain. Due to time constraints, an assessment of her knee was performed today; hip assessment may be completed at a later date. Upon assessment, her primary impairments include painful and limited L knee flexion and extension AROM and PROM, weak BIL global hip musculature, crepitus upon patellar mobilization BIL, weak and painful L knee extension, and TTP to the L popliteal fossa at semitendinosis and semimembranosis tendons. 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 Other contributing factors may include hamstring tendinopathy affecting the semimembranosis and semitendinosis on the L. These findings, along with known L knee OA may all be contributory to the pt's knee symptoms. She will benefit from skilled PT to address  her primary impairments and return to her prior level of function with minimal limitation. The pt also agreed to speak with her PCM at her upcoming doctor's appointment to discuss receiving a  referral to nutrition to address her weight loss due to not eating following losing her teeth last year.    Personal Factors and Comorbidities Comorbidity 3+    Comorbidities See medical Hx above    Examination-Activity Limitations Bathing;Locomotion Level;Transfers;Bed Mobility;Bend;Sit;Squat;Dressing;Stairs;Stand    Examination-Participation Restrictions Laundry;Cleaning    Stability/Clinical Decision Making Evolving/Moderate complexity    Clinical Decision Making Moderate    Rehab Potential Good    PT Frequency 2x / week    PT Duration 8 weeks    PT Treatment/Interventions ADLs/Self Care Home Management;Aquatic Therapy;Biofeedback;Cryotherapy;Electrical Stimulation;DME Instruction;Moist Heat;Gait training;Stair training;Functional mobility training;Therapeutic activities;Therapeutic exercise;Balance training;Neuromuscular re-education;Manual techniques;Patient/family education;Manual lymph drainage;Taping;Compression bandaging;Passive range of motion;Dry needling;Joint Manipulations    PT Next Visit Plan Assess balance, assess R hip, progress LE strengthening with focus on eccentric quadriceps strength    PT Home Exercise Plan [id number]    Recommended Other Services Nutrition    Consulted and Agree with Plan of Care Patient             Patient will benefit from skilled therapeutic intervention in order to improve the following deficits and impairments:  Abnormal gait, Decreased range of motion, Difficulty walking, Increased muscle spasms, Decreased endurance, Decreased activity tolerance, Pain, Improper body mechanics, Hypomobility, Decreased balance, Decreased strength, Postural dysfunction, Increased edema, Decreased mobility  Visit Diagnosis: Chronic pain of left knee  Pain in right hip  Unsteadiness on feet  Muscle weakness (generalized)     Problem List Patient Active Problem List   Diagnosis Date Noted   Synovitis of left knee 07/26/2020   COVID-19 virus infection  05/11/2020   Low back pain 11/24/2019   Impingement syndrome of right shoulder 04/22/2019   History of dental surgery 02/05/2019   COPD GOLD 0 ? 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   GERD (gastroesophageal reflux disease)    Breast pain 06/23/2018   Dyspareunia in female 09/13/2017   Cervicalgia 04/24/2017   CKD (chronic kidney disease) 10/29/2016   Chronic pain syndrome 05/10/2016   MDD (major depressive disorder) 10/19/2015   Dyspnea on exertion 08/22/2015   HTN (hypertension) 07/08/2015   Routine health maintenance 07/08/2015   Tobacco abuse 07/08/2015     [facility] [place], [place], [address] Phone: [phone]   Fax:  [phone]  Name: MADLINE OESTERLING MRN: [id number] Date of Birth: 1965-11-09  Check all possible CPT codes: 85277- Therapeutic Exercise, [phone]- Neuro Re-education, [phone] - Gait Training, [phone] - Manual Therapy, [phone] - Therapeutic Activities, [phone] - Self Care, [phone] - Electrical stimulation (unattended), [phone] - Physical performance training, and [id number] - Aquatic therapy  Vanessa Rembert, PT, DPT 11/16/20 1:45 PM

## 2020-11-16 NOTE — Patient Instructions (Signed)
  [id number]

## 2020-11-22 ENCOUNTER — Ambulatory Visit: Payer: Medicaid Other

## 2020-11-22 ENCOUNTER — Other Ambulatory Visit: Payer: Self-pay

## 2020-11-22 VITALS — BP 145/100

## 2020-11-22 DIAGNOSIS — R2681 Unsteadiness on feet: Secondary | ICD-10-CM

## 2020-11-22 DIAGNOSIS — M25551 Pain in right hip: Secondary | ICD-10-CM

## 2020-11-22 DIAGNOSIS — G8929 Other chronic pain: Secondary | ICD-10-CM

## 2020-11-22 DIAGNOSIS — M25562 Pain in left knee: Secondary | ICD-10-CM | POA: Diagnosis not present

## 2020-11-22 DIAGNOSIS — M6281 Muscle weakness (generalized): Secondary | ICD-10-CM

## 2020-11-22 NOTE — Therapy (Addendum)
[place], [place], [address] Phone: [phone]   Fax:  [phone]  Physical Therapy Treatment/ Discharge Summary  Patient Details  Name: LAURELLA TULL MRN: [id number] Date of Birth: 1966-02-09 Referring Provider (PT): Marybelle Killings   Encounter Date: 11/22/2020   PT End of Session - 11/22/20 1454     Visit Number 2    Number of Visits 17    Date for PT Re-Evaluation 01/11/21    Authorization Type UHC MCR and MCD, No FOTO    Authorization Time Period 27 Visit Limit    Progress Note Due on Visit 10    PT Start Time 1455    PT Stop Time 1533    PT Time Calculation (min) 38 min    Activity Tolerance Patient tolerated treatment well    Behavior During Therapy [facility] for tasks assessed/performed             Past Medical History:  Diagnosis Date   Adnexal mass 2019   likely remnant fibroid on broad ligament; getting diagnostic lap   Anxiety    Arthritis    hip   Asthma    Carpal tunnel syndrome    bilateral   Chest wall pain 10/15/2016   Chronic lower back pain    Depression    Dyspnea    with exertion   GERD (gastroesophageal reflux disease)    Headache    migraines   Herpes    Hypertension    Liver lesion, right lobe 12/01/2015   CT chest without contrast 11/30/15 with hepatic lesions measuring 2.0 x 1.7 cm of the right lobe and 1 x 1 cm on the dome. CT abd/pel 8/18: 1.3cm inferior R liver lobe, prob benign but recommend 3-30mof/u with MRI w/wo IV contrast with attenuation MRI 6/19: combination of benign cavernous hemangiomas and small benign cysts   Poor dental hygiene    chipped upper front and several loose teeth   [place]    cocaine/crack cocaine - last use 02/07/18   SVD (spontaneous vaginal delivery)    x 1   Uses walker    for ambulation   Uterine leiomyoma 02/16/2016   s/p vaginal hysterectomy 2018   Vaginal discharge 03/26/2018    Past Surgical History:   Procedure Laterality Date   LAPAROSCOPIC OVARIAN CYSTECTOMY Right 02/25/2018   Procedure: LAPAROSCOPIC REMOVAL OF RIGHT ADNEXAL MASS;  Surgeon: EChancy Milroy MD;  [facility];  Service: Gynecology;  Laterality: Right;   SALPINGECTOMY Left    lap - ectopic preg   TOTAL HIP ARTHROPLASTY Left 01/20/2016   Procedure: LEFT TOTAL HIP ARTHROPLASTY ANTERIOR APPROACH;  Surgeon: CMcarthur Rossetti MD;  [facility];  Service: Orthopedics;  Laterality: Left;   TUBAL LIGATION     VAGINAL HYSTERECTOMY Right 12/11/2016   Procedure: HYSTERECTOMY VAGINAL WITH RIGHT SALPINGECTOMY;  Surgeon: EChancy Milroy MD;  [facility];  Service: Gynecology;  Laterality: Right;    Vitals:   11/22/20 1554  BP: (!) 145/100     Subjective Assessment - 11/22/20 1454     Subjective Pt presents to PT with reports of increased L knee pain. She has been compliant with her HEP with exception of TKE.                               [facility] Treatment/Exercise - 11/22/20 0001  Exercises   Exercises Knee/Hip      Knee/Hip Exercises: Standing   Terminal Knee Extension Limitations 2x10 red tband      Knee/Hip Exercises: Seated   Long Arc Quad 2 sets;10 reps;Both    Ball Squeeze 2x10 - 5 sec hold    Other Seated Knee/Hip Exercises seated clamshell red tband 2x15      Knee/Hip Exercises: Supine   Quad Sets 2 sets;10 reps    Quad Sets Limitations towel under knees    Bridges 10 reps                    PT Education - 11/22/20 1538     Education Details HEP    Person(s) Educated Patient    Methods Explanation;Demonstration;Handout    Comprehension Verbalized understanding;Returned demonstration              PT Short Term Goals - 11/16/20 1335       PT SHORT TERM GOAL #1   Title Pt will demonstrate understanding and regular adherence to her HEP in order to promote independence with the management of her primary impairments.    Baseline HEP  given at baseline    Time 4    Period Weeks    Status New    Target Date 12/14/20      PT SHORT TERM GOAL #2   Title -    Baseline -               PT Long Term Goals - 11/16/20 1336       PT LONG TERM GOAL #1   Title Pt will demonstrate L knee AROM of 125 degrees or more with less than 4/10 pain in order to promote normal gait pattern.    Baseline 100 degrees    Time 8    Period Weeks    Status New    Target Date 01/11/21      PT LONG TERM GOAL #2   Title Pt will demonstrate BIL global hip strength of 5/5 in order to progress an independent LE strengthening regimen.    Baseline global hip strength 4-/5    Time 8    Period Weeks    Status New    Target Date 01/11/21      PT LONG TERM GOAL #3   Title Pt will report ability to stand >20 minutes without the need of a rest break due to discomfort in order to perform tasks like washing dishes without limitation.    Baseline Pt unable to stand >10 minutes without significant discomfort requiring her to sit.    Time 8    Period Weeks    Status New    Target Date 01/11/21      PT LONG TERM GOAL #4   Title Pt will report ability to walk >15 minutes with a rollator without the need of a seated rest break in order to grocery shop without limitation.    Baseline pt unable to walk >2 minutes with a rollator due to fatigue.    Time 8    Period Weeks    Status New    Target Date 01/11/21      PT LONG TERM GOAL #5   Title Pt will report resting pain of <4/10 in order to promote independence in ADL's with minimal difficulty.    Baseline 8/10    Time 8    Period Weeks    Status New    Target Date 01/11/21  Plan - 11/22/20 1546     Clinical Impression Statement Pt tolerated treatment fair, but did present slightly restless while sitting edge of mat. Her BP was elevated today with PT educating pt to continue to self monitor at home. Today's session focused mainly on increasing LE strength and  stability of L knee. PT will continue to progress exercises as tolerated per POC.    PT Treatment/Interventions ADLs/Self Care Home Management;Aquatic Therapy;Biofeedback;Cryotherapy;Electrical Stimulation;DME Instruction;Moist Heat;Gait training;Stair training;Functional mobility training;Therapeutic activities;Therapeutic exercise;Balance training;Neuromuscular re-education;Manual techniques;Patient/family education;Manual lymph drainage;Taping;Compression bandaging;Passive range of motion;Dry needling;Joint Manipulations    PT Next Visit Plan Assess balance, assess R hip, progress LE strengthening with focus on eccentric quadriceps strength    PT Home Exercise Plan [id number]             Patient will benefit from skilled therapeutic intervention in order to improve the following deficits and impairments:  Abnormal gait, Decreased range of motion, Difficulty walking, Increased muscle spasms, Decreased endurance, Decreased activity tolerance, Pain, Improper body mechanics, Hypomobility, Decreased balance, Decreased strength, Postural dysfunction, Increased edema, Decreased mobility  Visit Diagnosis: Chronic pain of left knee  Pain in right hip  Unsteadiness on feet  Muscle weakness (generalized)     Problem List Patient Active Problem List   Diagnosis Date Noted   Synovitis of left knee 07/26/2020   COVID-19 virus infection 05/11/2020   Low back pain 11/24/2019   Impingement syndrome of right shoulder 04/22/2019   History of dental surgery 02/05/2019   COPD GOLD 0 ? 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 [place], [place], [address] Phone: [phone]   Fax:  [phone]  Name: LENELL MCCONNELL MRN: [id number] Date of Birth: August 08, 1965  PHYSICAL THERAPY DISCHARGE SUMMARY  Visits from Start of Care: 2  Current functional level related to goals / functional outcomes: Unable to assess   Remaining deficits: Unable to assess   Education / Equipment: HEP   Patient agrees to discharge. Patient goals were not met. Patient is being discharged due to not returning since the last visit.  Vanessa [place], PT, DPT 12/05/20 11:53 AM

## 2020-11-23 ENCOUNTER — Ambulatory Visit: Payer: Medicaid Other | Admitting: Orthopaedic Surgery

## 2020-11-24 ENCOUNTER — Ambulatory Visit: Payer: Medicaid Other

## 2020-11-28 ENCOUNTER — Ambulatory Visit: Payer: Medicaid Other

## 2020-11-28 ENCOUNTER — Telehealth: Payer: Self-pay

## 2020-11-28 NOTE — Telephone Encounter (Signed)
Left message for pt regarding her 1st no-show appointment. Stated the date and time for her next appointment, as well as the clinic attendance policy.

## 2020-11-29 ENCOUNTER — Ambulatory Visit: Payer: Medicaid Other | Admitting: Orthopaedic Surgery

## 2020-11-30 ENCOUNTER — Telehealth: Payer: Self-pay

## 2020-11-30 ENCOUNTER — Ambulatory Visit: Payer: Medicaid Other

## 2020-11-30 NOTE — Telephone Encounter (Signed)
Ssage for pt regarding her 2nd no show. She was advised that all future appointments except her next appointment will be cancelled and she will have to schedule 1 visit at a time in accordance with the clinic's attendance policy. Her next appointment was confirmed and the pt was provided the clinic's phone number. She was also advised that a 3rd no-show is grounds for discharge from PT.

## 2020-12-05 ENCOUNTER — Ambulatory Visit: Payer: Medicaid Other | Attending: Orthopaedic Surgery

## 2020-12-21 ENCOUNTER — Ambulatory Visit: Payer: Medicaid Other | Admitting: Surgery

## 2020-12-28 ENCOUNTER — Ambulatory Visit: Payer: Medicaid Other | Admitting: Surgery

## 2021-01-16 ENCOUNTER — Telehealth: Payer: Self-pay | Admitting: Orthopaedic Surgery

## 2021-01-16 NOTE — Telephone Encounter (Signed)
Patient called. She would like some pain medication. She said she does not care what pain medication. She is just in pain and would like something. Her call back number is [phone]

## 2021-01-16 NOTE — Telephone Encounter (Signed)
Please advise 

## 2021-01-17 ENCOUNTER — Telehealth: Payer: Self-pay | Admitting: Orthopaedic Surgery

## 2021-01-17 ENCOUNTER — Other Ambulatory Visit: Payer: Self-pay | Admitting: Orthopaedic Surgery

## 2021-01-17 MED ORDER — ACETAMINOPHEN-CODEINE #3 300-30 MG PO TABS: 1.0000 | ORAL_TABLET | Freq: Two times a day (BID) | ORAL | 0 refills | Status: DC | PRN

## 2021-01-17 NOTE — Telephone Encounter (Signed)
Received call from patient read note from [facility] concerning medication was sent to the pharmacy. (Tylenol 3)

## 2021-01-17 NOTE — Telephone Encounter (Signed)
Rx called to pharmacy. I called patient to advise. Mailbox is full, unable to leave message.

## 2021-01-17 NOTE — Telephone Encounter (Signed)
Thank you :)

## 2021-01-17 NOTE — Telephone Encounter (Signed)
I called again, no answer, mailbox is full.  If patient returns call, please let her know medication has been called to her pharmacy. Thanks.

## 2021-01-26 ENCOUNTER — Other Ambulatory Visit: Payer: Self-pay

## 2021-01-26 ENCOUNTER — Encounter: Payer: Self-pay | Admitting: Surgery

## 2021-01-26 ENCOUNTER — Ambulatory Visit (INDEPENDENT_AMBULATORY_CARE_PROVIDER_SITE_OTHER): Payer: Medicaid Other | Admitting: Surgery

## 2021-01-26 VITALS — BP 138/81 | HR 65 | Ht 63.0 in | Wt 137.2 lb

## 2021-01-26 DIAGNOSIS — M25562 Pain in left knee: Secondary | ICD-10-CM

## 2021-01-26 DIAGNOSIS — G8929 Other chronic pain: Secondary | ICD-10-CM

## 2021-01-26 DIAGNOSIS — M25551 Pain in right hip: Secondary | ICD-10-CM

## 2021-01-26 NOTE — Progress Notes (Addendum)
55 year old black female returns with complaints of chronic right hip and left knee pain.  Last seen by Dr. Lorin Mercy for these problems October 12, 2020.  At that visit he had schedule patient for formal PT and she was supposed to come back and see him 6 weeks later.  I reviewed patient's chart and she had to formal PT visits and then no-show the remaining.  There is a note in her chart where a physical therapist documented her second no-show November 30, 2020 and there was also documentation of the third no-show being grounds for discharge from PT.  Patient advised my assistant that she wanted surgery for her left knee but I advised patient that I did not see where Dr. Lorin Mercy had proposed any surgical intervention.  Advised patient that he wanted her to do physical therapy to see if this would help her symptoms.  Patient states that she did not want to do any physical therapy and that she did not see the need for paying for it if it was not helping after 2 visits.  She asked about having gel injections instead of going to physical therapy and I advised patient that she would have to speak with Dr. Lorin Mercy about that.  During my short visit with patient she was very argumentative and stated that she did not know why she was seeing me anyway.  She stated she wanted a copy of the notes that I made that has her height weight and pulse.  I told her that my notes from today's visit would be reflected in her office note.  I advised patient that the vitals should be listed on her after visit summary.  Patient got up and walked out of the office.  No charged her for today's visit.  I advised my assistant to make sure that she has an appointment scheduled with Dr. Lorin Mercy in 1 week and also to make sure that her vitals were listed on her after visit summary.

## 2021-01-30 ENCOUNTER — Other Ambulatory Visit: Payer: Self-pay | Admitting: Orthopaedic Surgery

## 2021-01-30 ENCOUNTER — Telehealth: Payer: Self-pay | Admitting: Orthopaedic Surgery

## 2021-01-30 MED ORDER — ACETAMINOPHEN-CODEINE #3 300-30 MG PO TABS: 1.0000 | ORAL_TABLET | Freq: Two times a day (BID) | ORAL | 0 refills | Status: DC | PRN

## 2021-01-30 NOTE — Telephone Encounter (Signed)
Called to pharmacy. I called patient and advised last refill.

## 2021-01-30 NOTE — Telephone Encounter (Signed)
Patient called. She would like some pain medication called in for her. Her call back number is [phone]

## 2021-01-30 NOTE — Telephone Encounter (Signed)
Please advise 

## 2021-01-30 NOTE — Addendum Note (Signed)
Addended by: Meyer Cory on: 01/30/2021 03:52 PM   Modules accepted: Orders

## 2021-02-01 ENCOUNTER — Telehealth: Payer: Self-pay | Admitting: Radiology

## 2021-02-01 NOTE — Telephone Encounter (Signed)
Received fax from pharmacy that tylenol #3 needed PA or alternative medication sent in. Prior auth started.  zhavia cunanan (Key: [id number]) [id number] Acetaminophen-Codeine #3 300-30MG  tablets Status: PA Request Created: September 28th, 2022 Sent: September 28th, 2022 Open

## 2021-02-03 NOTE — Telephone Encounter (Signed)
Jean [name] (Key: [id number]) [id number] Acetaminophen-Codeine #3 300-30MG  tablets Status: PA Response - Approved Created: September 28th, 2022 Sent: September 28th, 2022

## 2021-02-08 ENCOUNTER — Ambulatory Visit: Payer: Medicaid Other | Admitting: Orthopaedic Surgery

## 2021-02-13 ENCOUNTER — Other Ambulatory Visit: Payer: Self-pay | Admitting: Student

## 2021-02-13 DIAGNOSIS — F4321 Adjustment disorder with depressed mood: Secondary | ICD-10-CM

## 2021-02-21 ENCOUNTER — Ambulatory Visit: Payer: Medicaid Other | Admitting: Orthopaedic Surgery

## 2021-03-01 ENCOUNTER — Other Ambulatory Visit: Payer: Self-pay | Admitting: Student

## 2021-03-01 ENCOUNTER — Telehealth: Payer: Self-pay

## 2021-03-01 ENCOUNTER — Ambulatory Visit: Payer: Medicaid Other | Admitting: Orthopaedic Surgery

## 2021-03-01 NOTE — Telephone Encounter (Signed)
Pt called at 10:00 stating that she was going to be late I stated that we have a 15 min policy. She stated she wouldn't be here until around 10:30. I offered to reschedule she was very upset and stating that she needs to be seen as soon as possible and that its my fault.  She asked me if the least I could do was ask Dr. Lorin Mercy for some pain medication.  Please advise

## 2021-03-01 NOTE — Telephone Encounter (Signed)
Attempted to contact patient however had to leave a voicemail informing her of Dr. Lorin Mercy response. If she has any additional questions or concerns she is able to contact our office back. Office number was provided on voicemail.

## 2021-03-10 ENCOUNTER — Other Ambulatory Visit: Payer: Self-pay

## 2021-03-10 ENCOUNTER — Ambulatory Visit (INDEPENDENT_AMBULATORY_CARE_PROVIDER_SITE_OTHER): Payer: Medicaid Other | Admitting: Orthopaedic Surgery

## 2021-03-10 ENCOUNTER — Encounter: Payer: Self-pay | Admitting: Orthopaedic Surgery

## 2021-03-10 VITALS — BP 125/79 | HR 71 | Ht 63.0 in | Wt 134.4 lb

## 2021-03-10 DIAGNOSIS — M659 Synovitis and tenosynovitis, unspecified: Secondary | ICD-10-CM | POA: Diagnosis not present

## 2021-03-10 NOTE — Progress Notes (Signed)
Office Visit Note   Patient: Jean [name]           Date of Birth: Jul 25, 1965           MRN: [id number] Visit Date: 03/10/2021              Requested by: Jose Persia, MD [address] [place],  [place] [address] PCP: Jose Persia, MD   Assessment & Plan: Visit Diagnoses:  1. Synovitis of left knee   2.      Right hip OA  Plan: Patient can continue taking plain Tylenol 2 p.o. twice a day as needed.  Recheck 6 months.  Discussed with her at this point right hip is not severe enough based on physical exam and x-ray findings to consider total hip arthroplasty.  We reviewed MRI scan previous knee radiographs knee MRI.  Recheck 6 months.  Follow-Up Instructions: Return in about 6 months (around 09/07/2021).   Orders:  No orders of the defined types were placed in this encounter.  No orders of the defined types were placed in this encounter.     Procedures: No procedures performed   Clinical Data: No additional findings.   Subjective: Chief Complaint  Patient presents with   Right Hip - Pain, Follow-up   Left Knee - Pain, Follow-up    HPI 55 year old female returns for some ongoing problems with her left knee which has some articular thinning by previous MRI.  No ligamentous or meniscal injury by previous MRI.  She also has had some pain in her right hip previous left total hip arthroplasty with MRI showing cartilage thinning.  She does not have any significant osteophyte formation in the right hip and on standing hip x-rays she is maintained some disc space.  She is using a rolling walker with reverse seat she uses a cane at home.  She went to therapy for 3 visits and states it made her tired and sore when she got home.  She did use some Tylenol with codeine in the past and over 3 months she has used a total of 20 tablets.  She has had problems with her stomach with chronic diarrhea.  Also occasional problems with control of her bladder.  MRI scan lumbar  showed no areas compression.  Review of Systems all other systems noncontributory to HPI.   Objective: Vital Signs: BP 125/79   Pulse 71   Ht 5\' 3"  (1.6 m)   Wt 134 lb 6.4 oz (61 kg)   LMP  (LMP Unknown)   BMI 23.81 kg/m   Physical Exam Constitutional:      Appearance: She is well-developed.  HENT:     Head: Normocephalic.     Right Ear: External ear normal.     Left Ear: External ear normal. There is no impacted cerumen.  Eyes:     Pupils: Pupils are equal, round, and reactive to light.  Neck:     Thyroid: No thyromegaly.     Trachea: No tracheal deviation.  Cardiovascular:     Rate and Rhythm: Normal rate.  Pulmonary:     Effort: Pulmonary effort is normal.  Abdominal:     Palpations: Abdomen is soft.  Musculoskeletal:     Cervical back: No rigidity.  Skin:    General: Skin is warm and dry.  Neurological:     Mental Status: She is alert and oriented to person, place, and time.  Psychiatric:        Behavior: Behavior  normal.    Ortho Exam leg lengths are equal no pain with internal or external rotation right or left hip.  Crepitus with both knees with extension she has full extension good flexion collateral ligaments are normal.  Anterior tib gastrocsoleus is intact.  She is more tender over the right greater trochanter than sciatic notch.  Some tenderness lumbosacral junction.  Specialty Comments:  No specialty comments available.  Imaging: No results found.   PMFS History: Patient Active Problem List   Diagnosis Date Noted   Synovitis of left knee 07/26/2020   COVID-19 virus infection 05/11/2020   Low back pain 11/24/2019   Impingement syndrome of right shoulder 04/22/2019   History of dental surgery 02/05/2019   COPD GOLD 0 ? 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   GERD (gastroesophageal reflux disease)    Breast pain 06/23/2018   Dyspareunia in female 09/13/2017   Cervicalgia 04/24/2017   CKD (chronic  kidney disease) 10/29/2016   Chronic pain syndrome 05/10/2016   MDD (major depressive disorder) 10/19/2015   Dyspnea on exertion 08/22/2015   HTN (hypertension) 07/08/2015   Routine health maintenance 07/08/2015   Tobacco abuse 07/08/2015   Past Medical History:  Diagnosis Date   Adnexal mass 2019   likely remnant fibroid on broad ligament; getting diagnostic lap   Anxiety    Arthritis    hip   Asthma    Carpal tunnel syndrome    bilateral   Chest wall pain 10/15/2016   Chronic lower back pain    Depression    Dyspnea    with exertion   GERD (gastroesophageal reflux disease)    Headache    migraines   Herpes    Hypertension    Liver lesion, right lobe 12/01/2015   CT chest without contrast 11/30/15 with hepatic lesions measuring 2.0 x 1.7 cm of the right lobe and 1 x 1 cm on the dome. CT abd/pel 8/18: 1.3cm inferior R liver lobe, prob benign but recommend 3-56mo f/u with MRI w/wo IV contrast with attenuation MRI 6/19: combination of benign cavernous hemangiomas and small benign cysts   Poor dental hygiene    chipped upper front and several loose teeth   Substance abuse ([place])    cocaine/crack cocaine - last use 02/07/18   SVD (spontaneous vaginal delivery)    x 1   Uses walker    for ambulation   Uterine leiomyoma 02/16/2016   s/p vaginal hysterectomy 2018   Vaginal discharge 03/26/2018    Family History  Problem Relation Age of Onset   Cancer Mother    Hypertension Mother    Diabetes Mother    Breast cancer Mother        diagnosed in her 35's   Stroke Father    Breast cancer Cousin     Past Surgical History:  Procedure Laterality Date   LAPAROSCOPIC OVARIAN CYSTECTOMY Right 02/25/2018   Procedure: LAPAROSCOPIC REMOVAL OF RIGHT ADNEXAL MASS;  Surgeon: Chancy Milroy, MD;  Location: [facility];  Service: Gynecology;  Laterality: Right;   SALPINGECTOMY Left    lap - ectopic preg   TOTAL HIP ARTHROPLASTY Left 01/20/2016   Procedure: LEFT TOTAL HIP ARTHROPLASTY ANTERIOR  APPROACH;  Surgeon: Mcarthur Rossetti, MD;  Location: [facility];  Service: Orthopedics;  Laterality: Left;   TUBAL LIGATION     VAGINAL HYSTERECTOMY Right 12/11/2016   Procedure: HYSTERECTOMY VAGINAL WITH RIGHT SALPINGECTOMY;  Surgeon: Chancy Milroy, MD;  Location: [facility];  Service: Gynecology;  Laterality: Right;   Social History   Occupational History   Not on file  Tobacco Use   Smoking status: Former    Packs/day: 0.50    Years: 29.00    Pack years: 14.50    Types: Cigarettes    Quit date: 11/25/2018    Years since quitting: 2.2   Smokeless tobacco: Never  Vaping Use   Vaping Use: Former  Substance and Sexual Activity   Alcohol use: Yes    Alcohol/week: 3.0 standard drinks    Types: 3 Cans of beer per week    Comment: Occasional Beer    Drug use: No    Types: "Crack" cocaine, Cocaine    Comment: last 6 months ago.   Sexual activity: Yes    Partners: Male    Birth control/protection: None, Post-menopausal    Comment: Hysterectomy

## 2021-04-03 ENCOUNTER — Encounter: Payer: Medicaid Other | Admitting: Internal Medicine

## 2021-04-12 ENCOUNTER — Ambulatory Visit: Payer: Medicaid Other | Admitting: Orthopaedic Surgery

## 2021-04-18 ENCOUNTER — Emergency Department (HOSPITAL_COMMUNITY): Payer: Medicaid Other

## 2021-04-18 ENCOUNTER — Ambulatory Visit (INDEPENDENT_AMBULATORY_CARE_PROVIDER_SITE_OTHER): Payer: Medicaid Other | Admitting: Internal Medicine

## 2021-04-18 ENCOUNTER — Encounter (HOSPITAL_COMMUNITY): Payer: Self-pay | Admitting: Emergency Medicine

## 2021-04-18 ENCOUNTER — Other Ambulatory Visit: Payer: Self-pay

## 2021-04-18 ENCOUNTER — Inpatient Hospital Stay (HOSPITAL_COMMUNITY)
Admission: EM | Admit: 2021-04-18 | Discharge: 2021-04-21 | DRG: 310 | Disposition: A | Discharge status: Home Health | Payer: Medicaid Other | Source: Ambulatory Visit | Attending: Internal Medicine | Admitting: Internal Medicine

## 2021-04-18 ENCOUNTER — Encounter: Payer: Self-pay | Admitting: Internal Medicine

## 2021-04-18 VITALS — BP 166/92 | HR 44 | Temp 97.7°F | Ht 64.0 in | Wt 135.1 lb

## 2021-04-18 DIAGNOSIS — G8929 Other chronic pain: Secondary | ICD-10-CM | POA: Diagnosis present

## 2021-04-18 DIAGNOSIS — I129 Hypertensive chronic kidney disease with stage 1 through stage 4 chronic kidney disease, or unspecified chronic kidney disease: Secondary | ICD-10-CM | POA: Diagnosis present

## 2021-04-18 DIAGNOSIS — N1831 Chronic kidney disease, stage 3a: Secondary | ICD-10-CM | POA: Diagnosis present

## 2021-04-18 DIAGNOSIS — I10 Essential (primary) hypertension: Secondary | ICD-10-CM | POA: Diagnosis present

## 2021-04-18 DIAGNOSIS — R55 Syncope and collapse: Secondary | ICD-10-CM

## 2021-04-18 DIAGNOSIS — Z7951 Long term (current) use of inhaled steroids: Secondary | ICD-10-CM

## 2021-04-18 DIAGNOSIS — Z96642 Presence of left artificial hip joint: Secondary | ICD-10-CM | POA: Diagnosis present

## 2021-04-18 DIAGNOSIS — K219 Gastro-esophageal reflux disease without esophagitis: Secondary | ICD-10-CM | POA: Diagnosis present

## 2021-04-18 DIAGNOSIS — Z20822 Contact with and (suspected) exposure to covid-19: Secondary | ICD-10-CM | POA: Diagnosis present

## 2021-04-18 DIAGNOSIS — Z8249 Family history of ischemic heart disease and other diseases of the circulatory system: Secondary | ICD-10-CM

## 2021-04-18 DIAGNOSIS — Z79899 Other long term (current) drug therapy: Secondary | ICD-10-CM

## 2021-04-18 DIAGNOSIS — R001 Bradycardia, unspecified: Principal | ICD-10-CM | POA: Diagnosis present

## 2021-04-18 DIAGNOSIS — G5603 Carpal tunnel syndrome, bilateral upper limbs: Secondary | ICD-10-CM | POA: Diagnosis present

## 2021-04-18 DIAGNOSIS — F32A Depression, unspecified: Secondary | ICD-10-CM | POA: Diagnosis present

## 2021-04-18 DIAGNOSIS — F419 Anxiety disorder, unspecified: Secondary | ICD-10-CM | POA: Diagnosis present

## 2021-04-18 DIAGNOSIS — Z91128 Patient's intentional underdosing of medication regimen for other reason: Secondary | ICD-10-CM

## 2021-04-18 DIAGNOSIS — Z9114 Patient's other noncompliance with medication regimen: Secondary | ICD-10-CM

## 2021-04-18 DIAGNOSIS — Z888 Allergy status to other drugs, medicaments and biological substances status: Secondary | ICD-10-CM

## 2021-04-18 DIAGNOSIS — N182 Chronic kidney disease, stage 2 (mild): Secondary | ICD-10-CM

## 2021-04-18 DIAGNOSIS — I152 Hypertension secondary to endocrine disorders: Secondary | ICD-10-CM

## 2021-04-18 DIAGNOSIS — M542 Cervicalgia: Secondary | ICD-10-CM | POA: Diagnosis present

## 2021-04-18 DIAGNOSIS — G43909 Migraine, unspecified, not intractable, without status migrainosus: Secondary | ICD-10-CM | POA: Diagnosis present

## 2021-04-18 DIAGNOSIS — Z8619 Personal history of other infectious and parasitic diseases: Secondary | ICD-10-CM

## 2021-04-18 DIAGNOSIS — J45909 Unspecified asthma, uncomplicated: Secondary | ICD-10-CM | POA: Diagnosis present

## 2021-04-18 DIAGNOSIS — T43226A Underdosing of selective serotonin reuptake inhibitors, initial encounter: Secondary | ICD-10-CM | POA: Diagnosis present

## 2021-04-18 DIAGNOSIS — Z87891 Personal history of nicotine dependence: Secondary | ICD-10-CM

## 2021-04-18 DIAGNOSIS — Z90710 Acquired absence of both cervix and uterus: Secondary | ICD-10-CM

## 2021-04-18 DIAGNOSIS — R0609 Other forms of dyspnea: Secondary | ICD-10-CM | POA: Diagnosis present

## 2021-04-18 DIAGNOSIS — M545 Low back pain, unspecified: Secondary | ICD-10-CM | POA: Diagnosis present

## 2021-04-18 LAB — CBC
HCT: 42.9 % (ref 36.0–46.0)
Hemoglobin: 13.4 g/dL (ref 12.0–15.0)
MCH: 31.2 pg (ref 26.0–34.0)
MCHC: 31.2 g/dL (ref 30.0–36.0)
MCV: 100 fL (ref 80.0–100.0)
Platelets: 384 10*3/uL (ref 150–400)
RBC: 4.29 MIL/uL (ref 3.87–5.11)
RDW: 13.1 % (ref 11.5–15.5)
WBC: 9.1 10*3/uL (ref 4.0–10.5)
nRBC: 0 % (ref 0.0–0.2)

## 2021-04-18 LAB — BASIC METABOLIC PANEL
Anion gap: 11 (ref 5–15)
BUN: 18 mg/dL (ref 6–20)
CO2: 27 mmol/L (ref 22–32)
Calcium: 10.4 mg/dL — ABNORMAL HIGH (ref 8.9–10.3)
Chloride: 103 mmol/L (ref 98–111)
Creatinine, Ser: 1.28 mg/dL — ABNORMAL HIGH (ref 0.44–1.00)
GFR, Estimated: 49 mL/min — ABNORMAL LOW (ref >60)
Glucose, Bld: 123 mg/dL — ABNORMAL HIGH (ref 70–99)
Potassium: 3.5 mmol/L (ref 3.5–5.1)
Sodium: 141 mmol/L (ref 135–145)

## 2021-04-18 LAB — MAGNESIUM: Magnesium: 2.5 mg/dL — ABNORMAL HIGH (ref 1.7–2.4)

## 2021-04-18 LAB — HEPATIC FUNCTION PANEL
ALT: 18 U/L (ref 0–44)
AST: 22 U/L (ref 15–41)
Albumin: 4.5 g/dL (ref 3.5–5.0)
Alkaline Phosphatase: 87 U/L (ref 38–126)
Bilirubin, Direct: <0.1 mg/dL (ref 0.0–0.2)
Total Bilirubin: 0.3 mg/dL (ref 0.3–1.2)
Total Protein: 9.9 g/dL — ABNORMAL HIGH (ref 6.5–8.1)

## 2021-04-18 LAB — VALPROIC ACID LEVEL: Valproic Acid Lvl: <10 ug/mL — ABNORMAL LOW (ref 50.0–100.0)

## 2021-04-18 LAB — CBG MONITORING, ED: Glucose-Capillary: 118 mg/dL — ABNORMAL HIGH (ref 70–99)

## 2021-04-18 MED ORDER — HYDROCHLOROTHIAZIDE 25 MG PO TABS
25.0000 mg | ORAL_TABLET | Freq: Every day | ORAL | Status: DC
  Administered 2021-04-19 – 2021-04-20 (×2): 25 mg via ORAL
  Filled 2021-04-18 (×2): qty 1

## 2021-04-18 MED ORDER — SERTRALINE HCL 100 MG PO TABS: 100.0000 mg | ORAL_TABLET | Freq: Every day | ORAL | Status: DC

## 2021-04-18 MED ORDER — SODIUM CHLORIDE 0.9% FLUSH: 3.0000 mL | Freq: Once | INTRAVENOUS | Status: DC

## 2021-04-18 MED ORDER — SERTRALINE HCL 50 MG PO TABS
50.0000 mg | ORAL_TABLET | Freq: Every day | ORAL | Status: DC
Start: 2021-04-18 — End: 2021-04-18

## 2021-04-18 MED ORDER — ONDANSETRON HCL 4 MG PO TABS: 4.0000 mg | ORAL_TABLET | Freq: Four times a day (QID) | ORAL | Status: DC | PRN

## 2021-04-18 MED ORDER — POLYVINYL ALCOHOL 1.4 % OP SOLN: 1.0000 [drp] | OPHTHALMIC | Status: DC | PRN

## 2021-04-18 MED ORDER — DIVALPROEX SODIUM ER 500 MG PO TB24: 500.0000 mg | ORAL_TABLET | Freq: Every day | ORAL | Status: DC

## 2021-04-18 MED ORDER — BOOST / RESOURCE BREEZE PO LIQD CUSTOM
1.0000 | Freq: Three times a day (TID) | ORAL | Status: DC
  Administered 2021-04-19 – 2021-04-21 (×5): 1 via ORAL
  Filled 2021-04-18: qty 1

## 2021-04-18 MED ORDER — ONDANSETRON HCL 4 MG/2ML IJ SOLN: 4.0000 mg | Freq: Four times a day (QID) | INTRAMUSCULAR | Status: DC | PRN

## 2021-04-18 MED ORDER — FLUTICASONE FUROATE-VILANTEROL 100-25 MCG/ACT IN AEPB
1.0000 | INHALATION_SPRAY | Freq: Every day | RESPIRATORY_TRACT | Status: DC
  Administered 2021-04-21: 1 via RESPIRATORY_TRACT
  Filled 2021-04-18: qty 28

## 2021-04-18 MED ORDER — PANTOPRAZOLE SODIUM 40 MG PO TBEC
40.0000 mg | DELAYED_RELEASE_TABLET | Freq: Every day | ORAL | Status: DC
  Administered 2021-04-18 – 2021-04-21 (×4): 40 mg via ORAL
  Filled 2021-04-18 (×4): qty 1

## 2021-04-18 MED ORDER — ACETAMINOPHEN 650 MG RE SUPP: 650.0000 mg | Freq: Four times a day (QID) | RECTAL | Status: DC | PRN

## 2021-04-18 MED ORDER — AMLODIPINE BESYLATE 10 MG PO TABS
10.0000 mg | ORAL_TABLET | Freq: Every day | ORAL | Status: DC
  Administered 2021-04-19 – 2021-04-21 (×3): 10 mg via ORAL
  Filled 2021-04-18 (×2): qty 1
  Filled 2021-04-18: qty 2

## 2021-04-18 MED ORDER — SERTRALINE HCL 50 MG PO TABS
50.0000 mg | ORAL_TABLET | Freq: Every day | ORAL | Status: DC
  Administered 2021-04-18 – 2021-04-20 (×3): 50 mg via ORAL
  Filled 2021-04-18 (×3): qty 1

## 2021-04-18 MED ORDER — NAPHAZOLINE-GLYCERIN 0.012-0.2 % OP SOLN: 1.0000 [drp] | Freq: Four times a day (QID) | OPHTHALMIC | Status: DC | PRN

## 2021-04-18 MED ORDER — ALBUTEROL SULFATE (2.5 MG/3ML) 0.083% IN NEBU: 3.0000 mL | INHALATION_SOLUTION | Freq: Four times a day (QID) | RESPIRATORY_TRACT | Status: DC | PRN

## 2021-04-18 MED ORDER — ACETAMINOPHEN 325 MG PO TABS
650.0000 mg | ORAL_TABLET | Freq: Four times a day (QID) | ORAL | Status: DC | PRN
  Administered 2021-04-18 – 2021-04-20 (×4): 650 mg via ORAL
  Filled 2021-04-18 (×4): qty 2

## 2021-04-18 MED ORDER — ENOXAPARIN SODIUM 40 MG/0.4ML IJ SOSY
40.0000 mg | PREFILLED_SYRINGE | INTRAMUSCULAR | Status: DC
  Administered 2021-04-18 – 2021-04-20 (×3): 40 mg via SUBCUTANEOUS
  Filled 2021-04-18 (×3): qty 0.4

## 2021-04-18 NOTE — ED Provider Notes (Signed)
Emergency Medicine Provider Triage Evaluation Note  Jean [name] , a 55 y.o. female  was evaluated in triage.  Pt complains of near syncopal episode at PCP office.  Patient was referred to emergency room for symptomatic bradycardia.  Patient endorses left upper quadrant abdominal pain, irregular heartbeat, headache yesterday that improved today, shortness of breath, and nausea vomiting.   Review of Systems  Positive: See above Negative: fever  Physical Exam  BP (!) 206/97 (BP Location: Right Arm)    Pulse (!) 45    Temp 97.9 F (36.6 C) (Oral)    Resp 16    LMP  (LMP Unknown)    SpO2 99%  Gen:   Awake, no distress   Resp:  Normal effort  MSK:   Moves extremities without difficulty  Other:    Medical Decision Making  Medically screening exam initiated at 5:00 PM.  Appropriate orders placed.  Jean [name] was informed that the remainder of the evaluation will be completed by another provider, this initial triage assessment does not replace that evaluation, and the importance of remaining in the ED until their evaluation is complete.  Notified nursing patient needs bed in back for symptomatic bradycardia.    Evlyn Courier, PA-C 04/18/21 1702    Lacretia Leigh, MD 04/21/21 [phone]

## 2021-04-18 NOTE — ED Triage Notes (Signed)
Pt brought to ED from [facility].  RN reports pt with nausea, vomiting, headache, and generalized weakness.  HR 40s and BP 202/102.  Pt slow to answer questions and follow commands.  Initially stated it was January but corrected herself.  States she did have a headache and took BP medications and it resolved.  Denies pain at present.

## 2021-04-18 NOTE — ED Notes (Signed)
Pt not responding  to be roomed  

## 2021-04-18 NOTE — H&P (Addendum)
Date: 04/18/2021               Patient Name:  Jean [name] MRN: [id number]  DOB: 07/10/1965 Age / Sex: 55 y.o., female   PCP: Jose Persia, MD         Medical Service: Internal Medicine Teaching Service         Attending Physician: Dr. Truddie Hidden, MD    First Contact: Dr. Alvie Heidelberg Pager: [phone]  Second Contact: Dr. Allyson Sabal Pager: [phone]       After Hours (After 5p/  First Contact Pager: [phone]  weekends / holidays): Second Contact Pager: [phone]   Chief Complaint: near syncope  History of Present Illness: Jean [name] is a 42 yoF with a history of hypertension, GERD, depression, and asthma presenting with a near syncopal episode.  She was at the [facility] clinic earlier this morning when she nearly passed out.  She states that she was in the waiting area when she suddenly felt worse and became lightheaded requiring her to sit in her wheelchair.  Denies losing any consciousness or falling and hitting her head.  Vitals at that time noted a heart rate in the 40s and follow blood pressure over than 200.  She was taken to [facility] emergency department for further work-up.    She states that she was in her usual state of health until yesterday when she started feeling sick.  She describes feeling off balance, nausea, vomiting and vague epigastric pain.  Denies any chest pain, shortness of breath, difficulty urinating, diarrhea or constipation.  States that she checked her blood pressure which was elevated at 206/102.  She also endorses frontal headache.  Endorses some photophobia but denies any changes in her vision.  States that she has a history of migraines for which she has tried many over-the-counter remedies as well as Tylenol and ibuprofen.  In the past she took Depakote for migraines and states that this helped.  She reports having trouble eating breakfast this morning.  Subsequently she took a bath and was feeling okay at this time.  As she was on her way to  her clinic appointment she endorses significant weakness.  At baseline she states that she uses a walker to ambulate.  Her daughter and sister help her at home with all of her ADLs.  She has noticed her blood pressure has been persistently elevated at home despite her taking her antihypertensives.  She also endorses feeling more stressed at home lately and frequent crying spells.  When asked about a bump on her right forehead she states that that has been there since 2000.  No recent trauma or falls.  She endorses left greater than right lower extremity weakness which he attributes to a left hip replacement.    Meds:  Current Meds  Medication Sig   acetaminophen (TYLENOL) 500 MG tablet Take 500 mg by mouth every 6 (six) hours as needed for mild pain.   albuterol (VENTOLIN HFA) 108 (90 Base) MCG/ACT inhaler INHALE 2 PUFFS BY MOUTH EVERY 6 HOURS AS NEEDED FOR COUGHING, WHEEZING, OR SHORTNESS OF BREATH (Patient taking differently: Inhale 2 puffs into the lungs every 6 (six) hours as needed for wheezing or shortness of breath (coughing).)   amLODipine (NORVASC) 10 MG tablet Take 1 tablet (10 mg total) by mouth daily.   cyclobenzaprine (FLEXERIL) 10 MG tablet TAKE 1 TABLET BY MOUTH TWICE A DAY AS NEEDED FOR MUSCLE SPASM (Patient taking differently: Take 10 mg  by mouth 2 (two) times daily.)   gabapentin (NEURONTIN) 300 MG capsule TAKE 1 CAPSULE BY MOUTH EVERYDAY AT BEDTIME (Patient taking differently: Take 300 mg by mouth at bedtime.)   hydrochlorothiazide (HYDRODIURIL) 25 MG tablet TAKE 1 TABLET BY MOUTH EVERY DAY (Patient taking differently: Take 25 mg by mouth daily.)   pantoprazole (PROTONIX) 40 MG tablet TAKE 1 TABLET BY MOUTH EVERY DAY (Patient taking differently: 40 mg daily.)   sertraline (ZOLOFT) 100 MG tablet TAKE 1 TABLET BY MOUTH EVERY DAY (Patient taking differently: Take 100 mg by mouth daily.)   SYMBICORT 80-4.5 MCG/ACT inhaler TAKE 2 PUFFS BY MOUTH TWICE A DAY (Patient taking differently:  Inhale 2 puffs into the lungs in the morning and at bedtime.)   tetrahydrozoline 0.05 % ophthalmic solution Place 1 drop into both eyes daily as needed (for dry eyes).      Allergies: Allergies as of 04/18/2021 - Review Complete 04/18/2021  Allergen Reaction Noted   Lisinopril Swelling and Other (See Comments) 06/14/2017   Past Medical History:  Diagnosis Date   Adnexal mass 2019   likely remnant fibroid on broad ligament; getting diagnostic lap   Anxiety    Arthritis    hip   Asthma    Carpal tunnel syndrome    bilateral   Chest wall pain 10/15/2016   Chronic lower back pain    Depression    Dyspnea    with exertion   GERD (gastroesophageal reflux disease)    Headache    migraines   Herpes    Hypertension    Liver lesion, right lobe 12/01/2015   CT chest without contrast 11/30/15 with hepatic lesions measuring 2.0 x 1.7 cm of the right lobe and 1 x 1 cm on the dome. CT abd/pel 8/18: 1.3cm inferior R liver lobe, prob benign but recommend 3-45mo f/u with MRI w/wo IV contrast with attenuation MRI 6/19: combination of benign cavernous hemangiomas and small benign cysts   Poor dental hygiene    chipped upper front and several loose teeth   Substance abuse ([place])    cocaine/crack cocaine - last use 02/07/18   SVD (spontaneous vaginal delivery)    x 1   Uses walker    for ambulation   Uterine leiomyoma 02/16/2016   s/p vaginal hysterectomy 2018   Vaginal discharge 03/26/2018    Family History:  Family History  Problem Relation Age of Onset   Cancer Mother    Hypertension Mother    Diabetes Mother    Breast cancer Mother        diagnosed in her 89's   Stroke Father    Breast cancer Cousin      Social History: On disability. She rents a room in a living facility. Daughter and sister assit her with all of her ADL's. She ambulates with the assistance of a walker. She is clean from crack cocaine for 3 years. Also quit cigarettes. She quit alcohol use this past summer in July,  but denies any heavy drinking.   Review of Systems: A complete ROS was negative except as per HPI.   Physical Exam: Blood pressure (!) 155/132, pulse (!) 51, temperature 97.9 F (36.6 C), temperature source Oral, resp. rate 15, SpO2 99 %. Physical Exam General: alert, appears stated age, in no acute distress HEENT: Normocephalic, atraumatic, EOM intact, conjunctiva normal CV: Regular rate and rhythm, no murmurs rubs or gallops Pulm: Clear to auscultation bilaterally, normal work of breathing Abdomen: Soft, nondistended, bowel sounds present, no tenderness  to palpation MSK: No lower extremity edema Skin: Warm and dry Neuro: Alert and oriented x3, bilateral UE and LE strength 5/5, sensation intact, no cranial nerve deficits  EKG: personally reviewed my interpretation is sinus bradycardia  CXR: personally reviewed my interpretation is no acute cardiopulmonary process  Assessment & Plan by Problem: Principal Problem:   Symptomatic bradycardia Active Problems:   HTN (hypertension)   Near syncope  Jean [name] is a 13 yoF with a history of hypertension, GERD, depression, and asthma presenting with a near syncopal episode, admitted for symptomatic bradycardia.  Symptomatic bradycardia Near syncope Patient presented to [facility] clinic today and had a near syncopal episode.  She has been feeling poorly since Sunday with generalized fatigue, weakness, headache nausea and vomiting.  She has noted that her blood pressure was elevated with systolic blood pressures greater than 200 at home.  She states that she has been taking her medications as prescribed but has been out of losartan.  When she arrived to clinic she states that she was in the waiting room and suddenly felt worse with lightheadedness so she sat down in her wheelchair.  Denies any loss of consciousness or fall.  Vitals checked at that time showed a heart rate in the 40s and blood pressure 200/106. She was taken to the emergency  department.  CT head unremarkable.  EKG confirmed sinus bradycardia but no ischemic changes.  Lab work was unremarkable. No signs or symptoms of infection. UA pending. Currently she is hemodynamically stable with a heart rate in the 60s and SBP in the 160s.  She is not on any medications that would contribute to bradycardia. I do not suspect underlying intracranial process with no neurological deficits and a normal CT head.  Of note patient is hypertensive with headache nausea and vomiting, likely in the setting of not taking her antihypertensives or possible migraine.  No signs of acute MI with no chest pain and without ischemic changes on EKG, troponin pending.  Last echocardiogram was in 2020 showed a normal EF greater than 65% with no regional wall abnormalities and normal right heart function.  There was moderate LV wall thickness. Possibly an exaggerated vasovagal episode given acuity of presentation.  Will check a TSH and orthostatics.  If work-up is unyielding can consider underlying sinus node dysfunction with long-term ambulatory monitoring with ILR. -Continue cardiac monitor -Avoid nodal blocking agents  -Check orthostatics  -Follow-up TSH -Follow-up troponin  Severe symptomatic hypertension Blood pressure 206/97 on arrival to ED. Home medications include amlodipine 10 mg, losartan 50 mg and HCTZ 25 mg daily.  She does report not currently taking losartan, last filled a year ago.  Of note, she had a reaction to lisinopril with lip swelling.  Will restart her current home medications and if she remains hypertensive add losartan. -Restart amlodipine 10 mg and hydrochlorothiazide 25 mg daily -Start losartan if she remains hypertensive -Continue cardiac monitoring  History of depression Patient was emotional during the examination.  States that she has a lot of stress going on at home right now.  States that she stopped taking her sertraline 100 mg but unable to tell us when.  Some of her  presenting symptoms could be related to abruptly stopping sertraline but unclear timeline of this. -Restart sertraline 50 mg daily and uptitrate as able  CKD Stage III  Cr 1.28, near baseline.  - Trend BMP - Avoid nephrotoxic agents   History of migraines Patient was taking Depakote for migraines but not  recently filled.  Dyspnea on exertion Possible COPD Inconclusive PFTs from 2020.  FVC 2.48, FEV1 2, FEV1/FVC ratio 84.  She had a significant response to bronchodilators however FVC measurements were not accurate as patient was unable to exhale the full time and response could be due to exhaling for a longer period of time compared to pre-bronchodilator. FEV1/FVC ratio probably not accurate either. DLCO low at 78, but apparently there was an error code related to this.  -Continue Symbicort  Diet: HH VTE prophylaxis: Lovenox Full code  Dispo: Admit patient to Observation with expected length of stay less than 2 midnights.  Signed: Mike Craze, DO 04/18/2021, 8:28 PM  Pager: @MYPAGER @ After 5pm on weekdays and 1pm on weekends: On Call pager: [phone]

## 2021-04-18 NOTE — ED Provider Notes (Signed)
[facility]PART[facility] Provider Note  CSN: [id number] Arrival date & time: 04/18/21 1634    History Chief Complaint  Patient presents with   Near Syncope   AMS    Jean [name] is a 55 y.o. female with history of multiple medical problems was at PCP office today for what she describes as 'to get a referral to a liver doctor' due to an abnormal MRI she had in 2019. While there she apparently had a near syncopal episode and was briefly confused. She was noted to be hypertensive and bradycardic there. She admits to noncompliance with her BP meds but has never had low HR before. She denies any CP but reports she has had years of chronic headaches, previously prescribed depakote but she does not think she is taking that anymore. She denies any CP today. She has not had a fever.    Past Medical History:  Diagnosis Date   Adnexal mass 2019   likely remnant fibroid on broad ligament; getting diagnostic lap   Anxiety    Arthritis    hip   Asthma    Carpal tunnel syndrome    bilateral   Chest wall pain 10/15/2016   Chronic lower back pain    Depression    Dyspnea    with exertion   GERD (gastroesophageal reflux disease)    Headache    migraines   Herpes    Hypertension    Liver lesion, right lobe 12/01/2015   CT chest without contrast 11/30/15 with hepatic lesions measuring 2.0 x 1.7 cm of the right lobe and 1 x 1 cm on the dome. CT abd/pel 8/18: 1.3cm inferior R liver lobe, prob benign but recommend 3-21mo f/u with MRI w/wo IV contrast with attenuation MRI 6/19: combination of benign cavernous hemangiomas and small benign cysts   Poor dental hygiene    chipped upper front and several loose teeth   Substance abuse ([place])    cocaine/crack cocaine - last use 02/07/18   SVD (spontaneous vaginal delivery)    x 1   Uses walker    for ambulation   Uterine leiomyoma 02/16/2016   s/p vaginal hysterectomy 2018   Vaginal discharge 03/26/2018    Past Surgical History:   Procedure Laterality Date   LAPAROSCOPIC OVARIAN CYSTECTOMY Right 02/25/2018   Procedure: LAPAROSCOPIC REMOVAL OF RIGHT ADNEXAL MASS;  Surgeon: Chancy Milroy, MD;  Location: [facility];  Service: Gynecology;  Laterality: Right;   SALPINGECTOMY Left    lap - ectopic preg   TOTAL HIP ARTHROPLASTY Left 01/20/2016   Procedure: LEFT TOTAL HIP ARTHROPLASTY ANTERIOR APPROACH;  Surgeon: Mcarthur Rossetti, MD;  Location: [facility];  Service: Orthopedics;  Laterality: Left;   TUBAL LIGATION     VAGINAL HYSTERECTOMY Right 12/11/2016   Procedure: HYSTERECTOMY VAGINAL WITH RIGHT SALPINGECTOMY;  Surgeon: Chancy Milroy, MD;  Location: [facility];  Service: Gynecology;  Laterality: Right;    Family History  Problem Relation Age of Onset   Cancer Mother    Hypertension Mother    Diabetes Mother    Breast cancer Mother        diagnosed in her 96's   Stroke Father    Breast cancer Cousin     Social History   Tobacco Use   Smoking status: Former    Packs/day: 0.50    Years: 29.00    Pack years: 14.50    Types: Cigarettes    Quit date: 11/25/2018    Years since quitting:  2.3   Smokeless tobacco: Never  Vaping Use   Vaping Use: Former  Substance Use Topics   Alcohol use: Yes    Alcohol/week: 3.0 standard drinks    Types: 3 Cans of beer per week    Comment: Occasional Beer    Drug use: No    Types: "Crack" cocaine, Cocaine    Comment: last 6 months ago.     Home Medications Prior to Admission medications   Medication Sig Start Date End Date Taking? Authorizing Provider  acetaminophen (TYLENOL) 500 MG tablet Take 500 mg by mouth every 6 (six) hours as needed for mild pain.   Yes [provider]  albuterol (VENTOLIN HFA) 108 (90 Base) MCG/ACT inhaler INHALE 2 PUFFS BY MOUTH EVERY 6 HOURS AS NEEDED FOR COUGHING, WHEEZING, OR SHORTNESS OF BREATH Patient taking differently: Inhale 2 puffs into the lungs every 6 (six) hours as needed for wheezing or shortness of breath (coughing).  02/16/20  Yes Aslam, Loralyn Freshwater, MD  amLODipine (NORVASC) 10 MG tablet Take 1 tablet (10 mg total) by mouth daily. 09/06/20  Yes Katsadouros, Vasilios, MD  cyclobenzaprine (FLEXERIL) 10 MG tablet TAKE 1 TABLET BY MOUTH TWICE A DAY AS NEEDED FOR MUSCLE SPASM Patient taking differently: Take 10 mg by mouth 2 (two) times daily. 09/06/20  Yes Katsadouros, Vasilios, MD  gabapentin (NEURONTIN) 300 MG capsule TAKE 1 CAPSULE BY MOUTH EVERYDAY AT BEDTIME Patient taking differently: Take 300 mg by mouth at bedtime. 03/02/21  Yes Jose Persia, MD  hydrochlorothiazide (HYDRODIURIL) 25 MG tablet TAKE 1 TABLET BY MOUTH EVERY DAY Patient taking differently: Take 25 mg by mouth daily. 02/12/20  Yes Mosetta Anis, MD  pantoprazole (PROTONIX) 40 MG tablet TAKE 1 TABLET BY MOUTH EVERY DAY Patient taking differently: 40 mg daily. 10/27/20  Yes Rehman, Areeg N, DO  sertraline (ZOLOFT) 100 MG tablet TAKE 1 TABLET BY MOUTH EVERY DAY Patient taking differently: Take 100 mg by mouth daily. 02/14/21  Yes Jose Persia, MD  SYMBICORT 80-4.5 MCG/ACT inhaler TAKE 2 PUFFS BY MOUTH TWICE A DAY Patient taking differently: Inhale 2 puffs into the lungs in the morning and at bedtime. 11/12/19  Yes Aslam, Loralyn Freshwater, MD  tetrahydrozoline 0.05 % ophthalmic solution Place 1 drop into both eyes daily as needed (for dry eyes).    Yes [provider]  divalproex (DEPAKOTE ER) 500 MG 24 hr tablet Take 1 tablet (500 mg total) by mouth daily. Patient not taking: Reported on 11/16/2020 04/01/20   Corena Herter, PA-C  ibuprofen (ADVIL) 600 MG tablet Take 1 tablet (600 mg total) by mouth every 6 (six) hours as needed. Patient not taking: Reported on 04/18/2021 06/30/20   Domenic Moras, PA-C  losartan (COZAAR) 25 MG tablet TAKE 2 TABLETS BY MOUTH EVERY DAY Patient not taking: Reported on 04/18/2021 12/11/19   Jose Persia, MD  Magnesium Gluconate 250 MG TABS Take 1 tablet (250 mg total) by mouth 2 (two) times daily between meals as needed  (migraine). Patient not taking: Reported on 04/18/2021 04/01/20   Corena Herter, PA-C  methylPREDNISolone (MEDROL) 4 MG tablet 6-day taper to be taken as directed Patient not taking: Reported on 06/30/2020 09/10/19   Lanae Crumbly, PA-C     Allergies    Lisinopril   Review of Systems   Review of Systems A comprehensive review of systems was completed and negative except as noted in HPI.    Physical Exam BP (!) 182/68    Pulse (!) 49  Temp 97.9 F (36.6 C) (Oral)    Resp 14    LMP  (LMP Unknown)    SpO2 100%   Physical Exam Vitals and nursing note reviewed.  Constitutional:      Appearance: Normal appearance.  HENT:     Head: Normocephalic and atraumatic.     Nose: Nose normal.     Mouth/Throat:     Mouth: Mucous membranes are moist.  Eyes:     Extraocular Movements: Extraocular movements intact.     Conjunctiva/sclera: Conjunctivae normal.  Cardiovascular:     Rate and Rhythm: Regular rhythm. Bradycardia present.  Pulmonary:     Effort: Pulmonary effort is normal.     Breath sounds: Normal breath sounds.  Abdominal:     General: Abdomen is flat.     Palpations: Abdomen is soft.     Tenderness: There is no abdominal tenderness.  Musculoskeletal:        General: No swelling. Normal range of motion.     Cervical back: Neck supple.  Skin:    General: Skin is warm and dry.  Neurological:     General: No focal deficit present.     Mental Status: She is alert and oriented to person, place, and time.     Cranial Nerves: No cranial nerve deficit.     Sensory: No sensory deficit.     Motor: No weakness.  Psychiatric:        Mood and Affect: Mood normal.     ED Results / Procedures / Treatments   Labs (all labs ordered are listed, but only abnormal results are displayed) Labs Reviewed  BASIC METABOLIC PANEL - Abnormal; Notable for the following components:      Result Value   Glucose, Bld 123 (*)    Creatinine, Ser 1.28 (*)    Calcium 10.4 (*)    GFR,  Estimated 49 (*)    All other components within normal limits  MAGNESIUM - Abnormal; Notable for the following components:   Magnesium 2.5 (*)    All other components within normal limits  CBG MONITORING, ED - Abnormal; Notable for the following components:   Glucose-Capillary 118 (*)    All other components within normal limits  CBC  URINALYSIS, ROUTINE W REFLEX MICROSCOPIC  VALPROIC ACID LEVEL  HEPATIC FUNCTION PANEL    EKG EKG Interpretation  Date/Time:  Tuesday April 18 2021 16:44:20 EST Ventricular Rate:  48 PR Interval:  150 QRS Duration: 98 QT Interval:  450 QTC Calculation: 402 R Axis:   -21 Text Interpretation: Sinus bradycardia Left ventricular hypertrophy with repolarization abnormality ( R in aVL , Cornell product , Romhilt-Estes ) Cannot rule out Septal infarct , age undetermined Abnormal ECG Since last tracing Rate slower Confirmed by Calvert Cantor [phone]) on 04/18/2021 5:49:24 PM  Radiology DG Chest 2 View  Result Date: 04/18/2021 CLINICAL DATA:  Chest pain, dyspnea, nausea and vomiting EXAM: CHEST - 2 VIEW COMPARISON:  02/16/2020 FINDINGS: Frontal and lateral views of the chest demonstrate a stable cardiac silhouette. No acute airspace disease, effusion, or pneumothorax. No acute bony abnormalities. IMPRESSION: 1. No acute intrathoracic process. Electronically Signed   By: Randa Ngo M.D.   On: 04/18/2021 17:42   CT Head Wo Contrast  Result Date: 04/18/2021 CLINICAL DATA:  Headache EXAM: CT HEAD WITHOUT CONTRAST TECHNIQUE: Contiguous axial images were obtained from the base of the skull through the vertex without intravenous contrast. COMPARISON:  MRI brain 11/18/2017, CT brain 11/18/2017 FINDINGS: Brain: No evidence of  acute infarction, hemorrhage, hydrocephalus, extra-axial collection or mass lesion/mass effect. Convex prominence of pituitary gland but similar compared to MRI from 2019. Vascular: No hyperdense vessel or unexpected calcification. Skull:  Normal. Negative for fracture or focal lesion. Sinuses/Orbits: No acute finding. Other: None IMPRESSION: Negative non contrasted CT appearance of the brain Electronically Signed   By: Donavan Foil M.D.   On: 04/18/2021 18:10    Procedures Procedures  Medications Ordered in the ED Medications  sodium chloride flush (NS) 0.9 % injection 3 mL (has no administration in time range)     MDM Rules/Calculators/A&P MDM  Patient here with hypertension, bradycardia and near syncope. She is alert and responding normally now. No focal deficits. Ongoing chronic symptoms of headaches, nausea and vomiting. Will check labs, CXR and head CT. Clinically stable now.   ED Course  I have reviewed the triage vital signs and the nursing notes.  Pertinent labs & imaging results that were available during my care of the patient were reviewed by me and considered in my medical decision making (see chart for details).  Clinical Course as of 04/18/21 1944  Tue Apr 18, 2021  1820 CXR and CT head are neg. BMP with CKD at baseline, no significant change. CBC is normal.  [CS]  1856 BP has improved without intervention, HR remains low. Given reported near syncope spell at IM office today, will discuss admission for further evaluation.  [CS]  1944 Spoke with IM resident who will come evaluate the patient.  [CS]    Clinical Course User Index [CS] Truddie Hidden, MD    Final Clinical Impression(s) / ED Diagnoses Final diagnoses:  Near syncope  Bradycardia    Rx / DC Orders ED Discharge Orders     None        Truddie Hidden, MD 04/18/21 1945

## 2021-04-19 ENCOUNTER — Other Ambulatory Visit: Payer: Self-pay

## 2021-04-19 ENCOUNTER — Observation Stay (HOSPITAL_BASED_OUTPATIENT_CLINIC_OR_DEPARTMENT_OTHER): Payer: Medicaid Other

## 2021-04-19 DIAGNOSIS — R9431 Abnormal electrocardiogram [ECG] [EKG]: Secondary | ICD-10-CM

## 2021-04-19 DIAGNOSIS — I10 Essential (primary) hypertension: Secondary | ICD-10-CM | POA: Diagnosis not present

## 2021-04-19 DIAGNOSIS — R001 Bradycardia, unspecified: Principal | ICD-10-CM

## 2021-04-19 DIAGNOSIS — R55 Syncope and collapse: Secondary | ICD-10-CM

## 2021-04-19 LAB — BASIC METABOLIC PANEL
Anion gap: 9 (ref 5–15)
BUN: 17 mg/dL (ref 6–20)
CO2: 24 mmol/L (ref 22–32)
Calcium: 9.5 mg/dL (ref 8.9–10.3)
Chloride: 103 mmol/L (ref 98–111)
Creatinine, Ser: 1.17 mg/dL — ABNORMAL HIGH (ref 0.44–1.00)
GFR, Estimated: 54 mL/min — ABNORMAL LOW (ref >60)
Glucose, Bld: 94 mg/dL (ref 70–99)
Potassium: 3.5 mmol/L (ref 3.5–5.1)
Sodium: 136 mmol/L (ref 135–145)

## 2021-04-19 LAB — CBC WITH DIFFERENTIAL/PLATELET
Abs Immature Granulocytes: 0.01 10*3/uL (ref 0.00–0.07)
Basophils Absolute: 0 10*3/uL (ref 0.0–0.1)
Basophils Relative: 0 %
Eosinophils Absolute: 0 10*3/uL (ref 0.0–0.5)
Eosinophils Relative: 1 %
HCT: 39.2 % (ref 36.0–46.0)
Hemoglobin: 12.8 g/dL (ref 12.0–15.0)
Immature Granulocytes: 0 %
Lymphocytes Relative: 42 %
Lymphs Abs: 3.1 10*3/uL (ref 0.7–4.0)
MCH: 31.9 pg (ref 26.0–34.0)
MCHC: 32.7 g/dL (ref 30.0–36.0)
MCV: 97.8 fL (ref 80.0–100.0)
Monocytes Absolute: 0.4 10*3/uL (ref 0.1–1.0)
Monocytes Relative: 5 %
Neutro Abs: 3.9 10*3/uL (ref 1.7–7.7)
Neutrophils Relative %: 52 %
Platelets: 325 10*3/uL (ref 150–400)
RBC: 4.01 MIL/uL (ref 3.87–5.11)
RDW: 12.9 % (ref 11.5–15.5)
WBC: 7.5 10*3/uL (ref 4.0–10.5)
nRBC: 0 % (ref 0.0–0.2)

## 2021-04-19 LAB — URINALYSIS, ROUTINE W REFLEX MICROSCOPIC
Bilirubin Urine: NEGATIVE
Glucose, UA: NEGATIVE mg/dL
Hgb urine dipstick: NEGATIVE
Ketones, ur: NEGATIVE mg/dL
Leukocytes,Ua: NEGATIVE
Nitrite: NEGATIVE
Protein, ur: NEGATIVE mg/dL
Specific Gravity, Urine: 1.025 (ref 1.005–1.030)
pH: 6.5 (ref 5.0–8.0)

## 2021-04-19 LAB — ECHOCARDIOGRAM COMPLETE
AR max vel: 2.15 cm2
AV Area VTI: 1.98 cm2
AV Area mean vel: 1.86 cm2
AV Mean grad: 7 mmHg
AV Peak grad: 11.4 mmHg
Ao pk vel: 1.69 m/s
Area-P 1/2: 3.17 cm2
Calc EF: 54.2 %
Height: 64 in
S' Lateral: 2.5 cm
Single Plane A2C EF: 55 %
Single Plane A4C EF: 53.6 %
Weight: 2161.6 oz

## 2021-04-19 LAB — RESP PANEL BY RT-PCR (FLU A&B, COVID) ARPGX2
Influenza A by PCR: NEGATIVE
Influenza B by PCR: NEGATIVE
SARS Coronavirus 2 by RT PCR: NEGATIVE

## 2021-04-19 LAB — TROPONIN I (HIGH SENSITIVITY): Troponin I (High Sensitivity): 9 ng/L (ref <18)

## 2021-04-19 LAB — HEMOGLOBIN A1C
Hgb A1c MFr Bld: 5.8 % — ABNORMAL HIGH (ref 4.8–5.6)
Mean Plasma Glucose: 119.76 mg/dL

## 2021-04-19 LAB — HIV ANTIBODY (ROUTINE TESTING W REFLEX): HIV Screen 4th Generation wRfx: NONREACTIVE

## 2021-04-19 LAB — TSH: TSH: 0.66 u[IU]/mL (ref 0.350–4.500)

## 2021-04-19 NOTE — Progress Notes (Signed)
Subjective: felt dizzy when doing orthostatic vitals. No chest pain, Does feel that chronic right neck/back/shoulder pain has been exacerbated  Objective:  Vital signs in last 24 hours: Vitals:   04/19/21 0812 04/19/21 0930 04/19/21 0940 04/19/21 1230  BP: (!) 174/74 (!) 149/96 (!) 149/96 (!) 175/82  Pulse: 87 80  (!) 54  Resp: 20 (!) 22  14  Temp: 99 F (37.2 C)     TempSrc: Oral     SpO2:  98%  98%   General: resting in bed, NAD HEENT: no scleral icterus, muscle tenderness and ropiness of bilateral (L>R) trapezisus, SCM and cervical paraspinals Cardiac: RRR, no rubs, murmurs or gallops Pulm: clear to auscultation bilaterally, moving normal volumes of air Abd: soft, nontender, nondistended, BS present Ext: warm and well perfused, no pedal edema Neuro: alert and oriented X3  Assessment/Plan:   Near syncope likely due to Symptomatic bradycardia - no longer bradycardic but with down to 40s at the time of near syncopal event. - not receiving any negative chronotropic medications - continue telemetry - consult cardiology    HTN (hypertension) - suboptimal adherence to prescribed outpatient BP meds which include amlodipine, HCTZ and losartan. -amlodipine 53m daily and HCTZ 224mdaily  Left neck pain, suspect Muscular spasm contributing - may continue tylenol.  Add heating pad, Await cardiology plans but may consider muscle relaxer.  CKD stagle 3a -eGFR appears to be at baseline.   Prior to Admission Living Arrangement: home Anticipated Discharge Location:home Barriers to Discharge: cardiology eval. Dispo: Anticipated discharge in approximately 0-1 day(s). Awaiting cardiology consultation  HoLucious GrovesDO 04/19/2021, 1:29 PM Pager: [phone] 5pm on weekdays and 1pm on weekends: On Call pager [phone]

## 2021-04-19 NOTE — Consult Note (Addendum)
The patient has been seen in conjunction with Jean [name], PAC. All aspects of care have been considered and discussed. The patient has been personally interviewed, examined, and all clinical data has been reviewed.  According the patient, when she had near syncope in the clinic, she was having nausea and he just finished retching and vomiting.  Records have been reviewed.  EKGs demonstrated sinus bradycardia.  No acute ST-T wave change.  Subsequent telemetry has demonstrated sporadic increases in heart rate into the low 100s. CONCLUSIONS: 1.)  Bradycardia and near syncope likely related to parasympathetic outpouring/vagal reaction. 2.)  Chest pain not felt to be cardiac. Agree with echocardiogram.  Unless cardiac markers become elevated or some specific abnormality found on echo, I do not believe any further work-up is necessary at this time.     Cardiology Consultation:   Patient ID: Jean [name] MRN: [id number]; DOB: 1966-02-18  Admit date: 04/18/2021 Date of Consult: 04/19/2021  PCP:  Jean Persia, MD   [facility] Providers Cardiologist:  None  new  Patient Profile:   Jean [name] is a 55 y.o. female with a hx of HTN, OA, asthma, chronic pain issues, who is being seen 04/19/2021 for the evaluation of bradycardia and near-syncope at the request of Jean [name].  History of Present Illness:   Jean [name] has never seen Cardiology before. An echo was done 2020 for DOE, normal.  She was sent to the ER from PCP ofc w/ N&V, HA, weakness. HR 40s, BP elevated. Pt admitted non-compliance w/ meds. Not on any rate-lowering meds.  Pt had near-syncope in [place] ofc, felt 2nd bradycardia, Cards asked to see.  Jean [name] had been out of one of her BP meds, was taking the others. She knows that she gets HA when her BP is high. Tylenol takes the edge off, but they do not go away. She cannot remember a day w/out a HA.   Her BP always runs high, she cannot remember a day when it  was not high.  She has sharp chest pain regularly, but not every day. It goes and comes.  She has had a low HR at times since 2013 according to records, has never been aware of it.   She has problems with being lightheaded at times.  However, she has associated those with her blood pressure being high.  And they checked orthostatics today, she got lightheaded and dizzy when she stood up.  However, neither her blood pressure nor her heart rate were low at the time.  Orthostatic VS for the past 24 hrs:  BP- Lying Pulse- Lying BP- Sitting Pulse- Sitting BP- Standing at 0 minutes Pulse- Standing at 0 minutes  04/19/21 0808 145/70 66 139/88 74 (!) 178/102 98     Past Medical History:  Diagnosis Date   Adnexal mass 2019   likely remnant fibroid on broad ligament; getting diagnostic lap   Anxiety    Arthritis    hip   Asthma    Carpal tunnel syndrome    bilateral   Chest wall pain 10/15/2016   Chronic lower back pain    Depression    Dyspnea    with exertion   GERD (gastroesophageal reflux disease)    Headache    migraines   Herpes    Hypertension    Liver lesion, right lobe 12/01/2015   CT chest without contrast 11/30/15 with hepatic lesions measuring 2.0 x 1.7 cm of the right lobe and 1 x 1 cm  on the dome. CT abd/pel 8/18: 1.3cm inferior R liver lobe, prob benign but recommend 3-63mo f/u with MRI w/wo IV contrast with attenuation MRI 6/19: combination of benign cavernous hemangiomas and small benign cysts   Poor dental hygiene    chipped upper front and several loose teeth   Substance abuse ([place])    cocaine/crack cocaine - last use 02/07/18   SVD (spontaneous vaginal delivery)    x 1   Uses walker    for ambulation   Uterine leiomyoma 02/16/2016   s/p vaginal hysterectomy 2018   Vaginal discharge 03/26/2018    Past Surgical History:  Procedure Laterality Date   LAPAROSCOPIC OVARIAN CYSTECTOMY Right 02/25/2018   Procedure: LAPAROSCOPIC REMOVAL OF RIGHT ADNEXAL MASS;   Surgeon: Jean Milroy, MD;  Location: [facility];  Service: Gynecology;  Laterality: Right;   SALPINGECTOMY Left    lap - ectopic preg   TOTAL HIP ARTHROPLASTY Left 01/20/2016   Procedure: LEFT TOTAL HIP ARTHROPLASTY ANTERIOR APPROACH;  Surgeon: Jean Rossetti, MD;  Location: [facility];  Service: Orthopedics;  Laterality: Left;   TUBAL LIGATION     VAGINAL HYSTERECTOMY Right 12/11/2016   Procedure: HYSTERECTOMY VAGINAL WITH RIGHT SALPINGECTOMY;  Surgeon: Jean Milroy, MD;  Location: [facility];  Service: Gynecology;  Laterality: Right;     Home Medications:  Prior to Admission medications   Medication Sig Start Date End Date Taking? Authorizing Provider  acetaminophen (TYLENOL) 500 MG tablet Take 500 mg by mouth every 6 (six) hours as needed for mild pain.   Yes [provider]  albuterol (VENTOLIN HFA) 108 (90 Base) MCG/ACT inhaler INHALE 2 PUFFS BY MOUTH EVERY 6 HOURS AS NEEDED FOR COUGHING, WHEEZING, OR SHORTNESS OF BREATH Patient taking differently: Inhale 2 puffs into the lungs every 6 (six) hours as needed for wheezing or shortness of breath (coughing). 02/16/20  Yes [name], Jean Freshwater, MD  amLODipine (NORVASC) 10 MG tablet Take 1 tablet (10 mg total) by mouth daily. 09/06/20  Yes [name], Vasilios, MD  cyclobenzaprine (FLEXERIL) 10 MG tablet TAKE 1 TABLET BY MOUTH TWICE A DAY AS NEEDED FOR MUSCLE SPASM Patient taking differently: Take 10 mg by mouth 2 (two) times daily. 09/06/20  Yes [name], Vasilios, MD  gabapentin (NEURONTIN) 300 MG capsule TAKE 1 CAPSULE BY MOUTH EVERYDAY AT BEDTIME Patient taking differently: Take 300 mg by mouth at bedtime. 03/02/21  Yes Jean Persia, MD  hydrochlorothiazide (HYDRODIURIL) 25 MG tablet TAKE 1 TABLET BY MOUTH EVERY DAY Patient taking differently: Take 25 mg by mouth daily. 02/12/20  Yes Mosetta Anis, MD  pantoprazole (PROTONIX) 40 MG tablet TAKE 1 TABLET BY MOUTH EVERY DAY Patient taking differently: 40 mg daily. 10/27/20  Yes [name],  Jean N, DO  sertraline (ZOLOFT) 100 MG tablet TAKE 1 TABLET BY MOUTH EVERY DAY Patient taking differently: Take 100 mg by mouth daily. 02/14/21  Yes Jean Persia, MD  SYMBICORT 80-4.5 MCG/ACT inhaler TAKE 2 PUFFS BY MOUTH TWICE A DAY Patient taking differently: Inhale 2 puffs into the lungs in the morning and at bedtime. 11/12/19  Yes [name], Jean Freshwater, MD  tetrahydrozoline 0.05 % ophthalmic solution Place 1 drop into both eyes daily as needed (for dry eyes).    Yes [provider]  divalproex (DEPAKOTE ER) 500 MG 24 hr tablet Take 1 tablet (500 mg total) by mouth daily. Patient not taking: Reported on 11/16/2020 04/01/20   Corena Herter, PA-C  ibuprofen (ADVIL) 600 MG tablet Take 1 tablet (600 mg total) by mouth  every 6 (six) hours as needed. Patient not taking: Reported on 04/18/2021 06/30/20   Domenic Moras, PA-C  losartan (COZAAR) 25 MG tablet TAKE 2 TABLETS BY MOUTH EVERY DAY Patient not taking: Reported on 04/18/2021 12/11/19   Jean Persia, MD  Magnesium Gluconate 250 MG TABS Take 1 tablet (250 mg total) by mouth 2 (two) times daily between meals as needed (migraine). Patient not taking: Reported on 04/18/2021 04/01/20   Corena Herter, PA-C    Inpatient Medications: Scheduled Meds:  amLODipine  10 mg Oral Daily   enoxaparin (LOVENOX) injection  40 mg Subcutaneous Q24H   feeding supplement  1 Container Oral TID BM   fluticasone furoate-vilanterol  1 puff Inhalation Daily   hydrochlorothiazide  25 mg Oral Daily   pantoprazole  40 mg Oral Daily   sertraline  50 mg Oral QHS   sodium chloride flush  3 mL Intravenous Once   Continuous Infusions:  PRN Meds: acetaminophen **OR** acetaminophen, albuterol, polyvinyl alcohol  Allergies:    Allergies  Allergen Reactions   Lisinopril Swelling and Other (See Comments)    Lip swelling, patient reported - unable to confirm from chart     Social History:   Social History   Socioeconomic History   Marital status: Single     Spouse name: Not on file   Number of children: 1   Years of education: Not on file   Highest education level: Not on file  Occupational History   Not on file  Tobacco Use   Smoking status: Former    Packs/day: 0.50    Years: 29.00    Pack years: 14.50    Types: Cigarettes    Quit date: 11/25/2018    Years since quitting: 2.4   Smokeless tobacco: Never  Vaping Use   Vaping Use: Former  Substance and Sexual Activity   Alcohol use: Yes    Alcohol/week: 3.0 standard drinks    Types: 3 Cans of beer per week    Comment: Occasional Beer    Drug use: No    Types: "Crack" cocaine, Cocaine    Comment: last 6 months ago.   Sexual activity: Yes    Partners: Male    Birth control/protection: None, Post-menopausal    Comment: Hysterectomy  Other Topics Concern   Not on file  Social History Narrative   Lives with Esmeralda Arthur   Caffeine use: soda sometimes.   Right handed    Social Determinants of Health   Financial Resource Strain: Not on file  Food Insecurity: Not on file  Transportation Needs: Not on file  Physical Activity: Not on file  Stress: Not on file  Social Connections: Not on file  Intimate Partner Violence: Not on file    Family History:    Family History  Problem Relation Age of Onset   Cancer Mother    Hypertension Mother    Diabetes Mother    Breast cancer Mother        diagnosed in her 29's   Stroke Father    Breast cancer Cousin      ROS:  Please see the history of present illness.  All other ROS reviewed and negative.     Physical Exam/Data:   Vitals:   04/19/21 0756 04/19/21 0812 04/19/21 0930 04/19/21 0940  BP: (!) 147/77 (!) 174/74 (!) 149/96 (!) 149/96  Pulse: (!) 50 87 80   Resp: 12 20 (!) 22   Temp:  99 F (37.2 C)    TempSrc:  Oral    SpO2: 98%  98%    No intake or output data in the 24 hours ending 04/19/21 1223 Last 3 Weights 04/18/2021 03/10/2021 01/26/2021  Weight (lbs) 135 lb 1.6 oz 134 lb 6.4 oz 137 lb 3.2 oz  Weight (kg)  61.281 kg 60.963 kg 62.234 kg     There is no height or weight on file to calculate BMI.  General:  Well nourished, well developed, in no acute distress HEENT: normal Neck: no JVD Vascular: No carotid bruits; Distal pulses 2+ bilaterally Cardiac:  normal S1, S2; RRR; soft murmur  Lungs:  clear to auscultation bilaterally, no wheezing, rhonchi or rales  Abd: soft, nontender, no hepatomegaly  Ext: no edema Musculoskeletal:  No deformities, BUE and BLE strength normal and equal Skin: warm and dry  Neuro:  CNs 2-12 intact, no focal abnormalities noted Psych:  Normal affect   EKG:  The EKG was personally reviewed and demonstrates: Sinus bradycardia, heart rate 46, no acute ischemic changes Telemetry:  Telemetry was personally reviewed and demonstrates: Sinus rhythm, sinus bradycardia  Relevant CV Studies:  ECHO: 04/19/2021 pending  ECHO: 11/11/2018  1. The left ventricle has hyperdynamic systolic function, with an  ejection fraction of >65%. The cavity size was normal. There is moderately increased left ventricular wall thickness. Left ventricular diastolic Doppler parameters are consistent with  impaired relaxation.   2. The right ventricle has normal systolic function. The cavity was  normal. There is no increase in right ventricular wall thickness.   3. The aortic valve is tricuspid. Moderate calcification of the aortic  valve with nodular calcification on the right coronary cusp. No stenosis of the aortic valve.   4. The aortic root and ascending aorta are normal in size and structure.   5. Normal IVC size. No complete TR doppler jet so unable to estimate PA systolic pressure.   6. No evidence of mitral valve stenosis. No significant mitral  regurgitation.   Laboratory Data:  High Sensitivity Troponin:   Recent Labs  Lab 04/18/21 2334  TROPONINIHS 9     Chemistry Recent Labs  Lab 04/18/21 1658 04/19/21 0352  NA 141 136  K 3.5 3.5  CL 103 103  CO2 27 24  GLUCOSE  123* 94  BUN 18 17  CREATININE 1.28* 1.17*  CALCIUM 10.4* 9.5  MG 2.5*  --   GFRNONAA 49* 54*  ANIONGAP 11 9    Recent Labs  Lab 04/18/21 1854  PROT 9.9*  ALBUMIN 4.5  AST 22  ALT 18  ALKPHOS 87  BILITOT 0.3   Lipids No results for input(s): CHOL, TRIG, HDL, LABVLDL, LDLCALC, CHOLHDL in the last 168 hours.  Hematology Recent Labs  Lab 04/18/21 1658 04/19/21 0352  WBC 9.1 7.5  RBC 4.29 4.01  HGB 13.4 12.8  HCT 42.9 39.2  MCV 100.0 97.8  MCH 31.2 31.9  MCHC 31.2 32.7  RDW 13.1 12.9  PLT 384 325   Thyroid  Recent Labs  Lab 04/18/21 2334  TSH 0.660    BNPNo results for input(s): BNP, PROBNP in the last 168 hours.  DDimer No results for input(s): DDIMER in the last 168 hours.   Radiology/Studies:  DG Chest 2 View  Result Date: 04/18/2021 CLINICAL DATA:  Chest pain, dyspnea, nausea and vomiting EXAM: CHEST - 2 VIEW COMPARISON:  02/16/2020 FINDINGS: Frontal and lateral views of the chest demonstrate a stable cardiac silhouette. No acute airspace disease, effusion, or pneumothorax. No acute bony abnormalities. IMPRESSION: 1. No  acute intrathoracic process. Electronically Signed   By: Randa Ngo M.D.   On: 04/18/2021 17:42   CT Head Wo Contrast  Result Date: 04/18/2021 CLINICAL DATA:  Headache EXAM: CT HEAD WITHOUT CONTRAST TECHNIQUE: Contiguous axial images were obtained from the base of the skull through the vertex without intravenous contrast. COMPARISON:  MRI brain 11/18/2017, CT brain 11/18/2017 FINDINGS: Brain: No evidence of acute infarction, hemorrhage, hydrocephalus, extra-axial collection or mass lesion/mass effect. Convex prominence of pituitary gland but similar compared to MRI from 2019. Vascular: No hyperdense vessel or unexpected calcification. Skull: Normal. Negative for fracture or focal lesion. Sinuses/Orbits: No acute finding. Other: None IMPRESSION: Negative non contrasted CT appearance of the brain Electronically Signed   By: Donavan Foil M.D.    On: 04/18/2021 18:10     Assessment and Plan:   Near syncope -In the ER, symptoms were associated with hypertension and tachycardia - She has had resting bradycardia for a long time, unclear if symptoms are related to this - Follow on telemetry, follow-up on echo results - Advise if she needs further work-up or should wear a monitor at discharge. -She is not on clonidine or a beta-blocker, so do not know of any extrinsic influences on her heart rate  2.  Hypertension: - She is not completely compliant with meds - According to the patient, her blood pressure is never controlled - She has headaches all day every day that she feels are related to her high blood pressure -The patient mentioned that her diastolic blood pressures are also always high - Management per IM  3.  Chest pain: - She has pain on a regular basis. - It is not exertional. -  It is sharp. - It goes away in just a few seconds. -Follow-up on echo results, but doubt further evaluation indicated   Risk Assessment/Risk Scores:     HEAR Score (for undifferentiated chest pain):  HEAR Score: 3   For questions or updates, please contact [place] Please consult [email or web address] for contact info under    Signed, Jean Ferries, PA-C  04/19/2021 12:23 PM

## 2021-04-19 NOTE — Hospital Course (Addendum)
Feels good this morning, a lot better than yesterday. No chest pain or palpitations overnight. Has a mild headache this morning. Was able to eat breakfast fine.

## 2021-04-19 NOTE — Progress Notes (Signed)
PT c/o 6/10 chest pain that is sharp and comes and goes. States it is the same pain that she wa EKG and vitals obtained. Cardiologist notified. No new orders given. Stated to try tylenol. Internal medicine also updated. See new orders.

## 2021-04-19 NOTE — Progress Notes (Signed)
Patient presented to the [facility] with ongoing nausea/vomiting x 12 hours with significant bradycardia (HR of 40) and severe HTN (initially 166/92, repeat >200/100) concerning for symptomatic bradycardia and hypertensive emergency. She was taken emergently to the ED for further evaluation.

## 2021-04-20 DIAGNOSIS — I152 Hypertension secondary to endocrine disorders: Secondary | ICD-10-CM | POA: Diagnosis not present

## 2021-04-20 DIAGNOSIS — F32A Depression, unspecified: Secondary | ICD-10-CM | POA: Diagnosis present

## 2021-04-20 DIAGNOSIS — R55 Syncope and collapse: Secondary | ICD-10-CM | POA: Diagnosis present

## 2021-04-20 DIAGNOSIS — Z79899 Other long term (current) drug therapy: Secondary | ICD-10-CM | POA: Diagnosis not present

## 2021-04-20 DIAGNOSIS — R0609 Other forms of dyspnea: Secondary | ICD-10-CM | POA: Diagnosis present

## 2021-04-20 DIAGNOSIS — Z20822 Contact with and (suspected) exposure to covid-19: Secondary | ICD-10-CM | POA: Diagnosis present

## 2021-04-20 DIAGNOSIS — M542 Cervicalgia: Secondary | ICD-10-CM | POA: Diagnosis present

## 2021-04-20 DIAGNOSIS — Z91128 Patient's intentional underdosing of medication regimen for other reason: Secondary | ICD-10-CM | POA: Diagnosis not present

## 2021-04-20 DIAGNOSIS — T43226A Underdosing of selective serotonin reuptake inhibitors, initial encounter: Secondary | ICD-10-CM | POA: Diagnosis present

## 2021-04-20 DIAGNOSIS — G5603 Carpal tunnel syndrome, bilateral upper limbs: Secondary | ICD-10-CM | POA: Diagnosis present

## 2021-04-20 DIAGNOSIS — I129 Hypertensive chronic kidney disease with stage 1 through stage 4 chronic kidney disease, or unspecified chronic kidney disease: Secondary | ICD-10-CM | POA: Diagnosis present

## 2021-04-20 DIAGNOSIS — M545 Low back pain, unspecified: Secondary | ICD-10-CM | POA: Diagnosis present

## 2021-04-20 DIAGNOSIS — Z9114 Patient's other noncompliance with medication regimen: Secondary | ICD-10-CM | POA: Diagnosis not present

## 2021-04-20 DIAGNOSIS — N1831 Chronic kidney disease, stage 3a: Secondary | ICD-10-CM | POA: Diagnosis present

## 2021-04-20 DIAGNOSIS — Z96642 Presence of left artificial hip joint: Secondary | ICD-10-CM | POA: Diagnosis present

## 2021-04-20 DIAGNOSIS — R001 Bradycardia, unspecified: Secondary | ICD-10-CM | POA: Diagnosis present

## 2021-04-20 DIAGNOSIS — K219 Gastro-esophageal reflux disease without esophagitis: Secondary | ICD-10-CM | POA: Diagnosis present

## 2021-04-20 DIAGNOSIS — G43909 Migraine, unspecified, not intractable, without status migrainosus: Secondary | ICD-10-CM | POA: Diagnosis present

## 2021-04-20 DIAGNOSIS — J45909 Unspecified asthma, uncomplicated: Secondary | ICD-10-CM | POA: Diagnosis present

## 2021-04-20 DIAGNOSIS — Z8249 Family history of ischemic heart disease and other diseases of the circulatory system: Secondary | ICD-10-CM | POA: Diagnosis not present

## 2021-04-20 DIAGNOSIS — F419 Anxiety disorder, unspecified: Secondary | ICD-10-CM | POA: Diagnosis present

## 2021-04-20 DIAGNOSIS — Z90710 Acquired absence of both cervix and uterus: Secondary | ICD-10-CM | POA: Diagnosis not present

## 2021-04-20 DIAGNOSIS — Z8619 Personal history of other infectious and parasitic diseases: Secondary | ICD-10-CM | POA: Diagnosis not present

## 2021-04-20 DIAGNOSIS — Z888 Allergy status to other drugs, medicaments and biological substances status: Secondary | ICD-10-CM | POA: Diagnosis not present

## 2021-04-20 DIAGNOSIS — Z7951 Long term (current) use of inhaled steroids: Secondary | ICD-10-CM | POA: Diagnosis not present

## 2021-04-20 DIAGNOSIS — G8929 Other chronic pain: Secondary | ICD-10-CM | POA: Diagnosis present

## 2021-04-20 LAB — BASIC METABOLIC PANEL
Anion gap: 9 (ref 5–15)
BUN: 23 mg/dL — ABNORMAL HIGH (ref 6–20)
CO2: 25 mmol/L (ref 22–32)
Calcium: 9.5 mg/dL (ref 8.9–10.3)
Chloride: 99 mmol/L (ref 98–111)
Creatinine, Ser: 1.45 mg/dL — ABNORMAL HIGH (ref 0.44–1.00)
GFR, Estimated: 42 mL/min — ABNORMAL LOW (ref >60)
Glucose, Bld: 92 mg/dL (ref 70–99)
Potassium: 3.5 mmol/L (ref 3.5–5.1)
Sodium: 133 mmol/L — ABNORMAL LOW (ref 135–145)

## 2021-04-20 LAB — CBC
HCT: 39.4 % (ref 36.0–46.0)
Hemoglobin: 12.9 g/dL (ref 12.0–15.0)
MCH: 31.4 pg (ref 26.0–34.0)
MCHC: 32.7 g/dL (ref 30.0–36.0)
MCV: 95.9 fL (ref 80.0–100.0)
Platelets: 361 10*3/uL (ref 150–400)
RBC: 4.11 MIL/uL (ref 3.87–5.11)
RDW: 12.7 % (ref 11.5–15.5)
WBC: 6.7 10*3/uL (ref 4.0–10.5)
nRBC: 0 % (ref 0.0–0.2)

## 2021-04-20 LAB — TROPONIN I (HIGH SENSITIVITY): Troponin I (High Sensitivity): 8 ng/L (ref <18)

## 2021-04-20 MED ORDER — ONDANSETRON HCL 4 MG PO TABS
4.0000 mg | ORAL_TABLET | Freq: Two times a day (BID) | ORAL | Status: AC | PRN
  Administered 2021-04-20: 4 mg via ORAL
  Filled 2021-04-20: qty 1

## 2021-04-20 MED ORDER — LACTATED RINGERS IV BOLUS
1000.0000 mL | Freq: Once | INTRAVENOUS | Status: AC
Start: 2021-04-20 — End: 2021-04-20
  Administered 2021-04-20: 1000 mL via INTRAVENOUS

## 2021-04-20 MED ORDER — LACTATED RINGERS IV BOLUS
1000.0000 mL | Freq: Once | INTRAVENOUS | Status: AC
  Administered 2021-04-20: 1000 mL via INTRAVENOUS

## 2021-04-20 MED ORDER — ONDANSETRON HCL 4 MG PO TABS: 4.0000 mg | ORAL_TABLET | Freq: Three times a day (TID) | ORAL | Status: DC | PRN

## 2021-04-20 MED ORDER — LACTATED RINGERS IV SOLN: INTRAVENOUS | Status: DC

## 2021-04-20 NOTE — Progress Notes (Signed)
Orthostatic VS Lying 134/78 pulse 72 Sitting 129/89 pulse 84 Standing 94/69 pulse 101 Edwena Blow, RN

## 2021-04-20 NOTE — Progress Notes (Signed)
° °  Subjective: patient is asked about lightheadedness when working with PT this morning. She reports this is new for her. Plan for fluids and re-assessment this afternoon is discussed and agreed upon with patient.  Objective:  Vital signs in last 24 hours: Vitals:   04/19/21 2202 04/20/21 0002 04/20/21 0429 04/20/21 0736  BP: (!) 153/92 (!) 172/88 135/82 132/74  Pulse: (!) 51 (!) 55 (!) 57 72  Resp: _0 Temp: 98.9 F (37.2 C) 98.1 F (36.7 C) 98.1 F (36.7 C) 98.3 F (36.8 C)  TempSrc: Oral Oral Oral Oral  SpO2: 99% 98% 100% 100%  Weight:  59.7 kg    Height:       General: resting in bed, NAD Cardiac: RRR, no rubs, murmurs or gallops Pulm: CTAB, normal air movement Abd: NTTP, +bs Ext: warm and well perfused, no pedal edema Neuro: alert and oriented X3  Assessment/Plan:  Pre-syncope Near syncope likely due to symptomatic bradycardia. Was bradycardic to the 40's at that time, found to be in sinus rhythm. Cardiology was consulted and they suspect a vagal etiology for her pre-syncope. Her HR has improved to the 60's over the past 24 hrs. Upon working with PT this AM patient had lightheadedness and orthostatic vitals signs were positive. - IVF, 1L LR x1 - Recheck orthostatics this afternoon - Telemetry - Potential D/C    HTN (hypertension) - suboptimal adherence to prescribed outpatient BP meds which include amlodipine, HCTZ and losartan.  -amlodipine 7m daily  -D/C HCTZ given poor PO and deleterious effects of diuresis in the setting of orthostatic hypotension  Left neck pain, suspect Muscular spasm contributing - may continue tylenol.  Add heating pad, Await cardiology plans but may consider muscle relaxer.  CKD stagle 3a -eGFR appears to be near baseline.   Prior to Admission Living Arrangement: home Anticipated Discharge Location:home Barriers to Discharge: cardiology eval. Dispo: Anticipated discharge in approximately 0-1 day(s). Awaiting cardiology  consultation  GCorky Sox MD 04/20/2021, 8:22 AM Pager: [phone] 5pm on weekdays and 1pm on weekends: On Call pager [phone]

## 2021-04-20 NOTE — Evaluation (Signed)
Occupational Therapy Evaluation Patient Details Name: Jean [name] MRN: [id number] DOB: 18-Sep-1965 Today's Date: 04/20/2021   History of Present Illness Pt is a 55 y.o. female who presented 04/18/21 with a near syncopal episode. Pt was hypertensive and bradycardic upon arrival. PMH: hypertension, GERD, depression, and asthma   Clinical Impression   Pt is overall functioning at a supervision level in ADL. Educated in compensatory strategies for ADL and managing fasteners. Recommended tub transfer bench and toilet riser. No further acute OT needs.       Recommendations for follow up therapy are one component of a multi-disciplinary discharge planning process, led by the attending physician.  Recommendations may be updated based on patient status, additional functional criteria and insurance authorization.   Follow Up Recommendations  Home health OT    Assistance Recommended at Discharge Intermittent Supervision/Assistance  Functional Status Assessment  Patient has had a recent decline in their functional status and demonstrates the ability to make significant improvements in function in a reasonable and predictable amount of time.  Equipment Recommendations  Toilet riser;Tub/shower bench    Recommendations for Other Services       Precautions / Restrictions Precautions Precautions: Fall;Other (comment) Precaution Comments: monitor BP      Mobility Bed Mobility Overal bed mobility: Modified Independent             General bed mobility comments: HOB up    Transfers Overall transfer level: Needs assistance Equipment used: Rollator (4 wheels) Transfers: Sit to/from Stand Sit to Stand: Supervision           General transfer comment: from bed and toilet      Balance Overall balance assessment: Needs assistance   Sitting balance-Leahy Scale: Good Sitting balance - Comments: able to don socks using figure 4 method with PT, unable with OT     Standing  balance-Leahy Scale: Fair Standing balance comment: Reliant on rollator for mobility, fair static balance                           ADL either performed or assessed with clinical judgement   ADL Overall ADL's : Needs assistance/impaired Eating/Feeding: Independent;Sitting   Grooming: Wash/dry hands;Standing;Supervision/safety   Upper Body Bathing: Set up;Sitting   Lower Body Bathing: Supervison/ safety;Sit to/from stand   Upper Body Dressing : Set up;Sitting   Lower Body Dressing: Set up;Sitting/lateral leans Lower Body Dressing Details (indicate cue type and reason): educated in technique pulling leg up on bed as pt cannot perform figure 4 technique             Functional mobility during ADLs: Supervision/safety;Rollator (4 wheels) General ADL Comments: educated pt in availbility of button aid, tub bench and toilet riser vs BSC over toilet     Vision Baseline Vision/History: 0 No visual deficits Ability to See in Adequate Light: 0 Adequate Patient Visual Report: No change from baseline       Perception     Praxis      Pertinent Vitals/Pain Pain Assessment: Faces Faces Pain Scale: No hurt     Hand Dominance Right   Extremity/Trunk Assessment Upper Extremity Assessment Upper Extremity Assessment: Overall WFL for tasks assessed (pt observed to open all packaging on her meal tray)   Lower Extremity Assessment Lower Extremity Assessment: Defer to PT evaluation   Cervical / Trunk Assessment Cervical / Trunk Assessment: Normal   Communication Communication Communication: No difficulties   Cognition Arousal/Alertness: Awake/alert Behavior During Therapy: [facility] for  tasks assessed/performed Overall Cognitive Status: Within Functional Limits for tasks assessed                                       General Comments       Exercises     Shoulder Instructions      Home Living Family/patient expects to be discharged to:: Private  residence Living Arrangements: Non-relatives/Friends Available Help at Discharge: Friend(s);Neighbor;Available PRN/intermittently Type of Home: House Home Access: Stairs to enter [employer] of Steps: 3 Entrance Stairs-Rails: Left Home Layout: One level     Bathroom Shower/Tub: Teacher, early years/pre: Standard     Home Equipment: Conservation officer, nature (2 wheels);Rollator (4 wheels);Cane - single point;Cane - quad          Prior Functioning/Environment Prior Level of Function : Needs assist             Mobility Comments: x2 falls in past 6 months. Uses rollator primarily, needs light help/guarding help on stairs ADLs Comments: Reports needing more assistance lately with buttoning and zipping clothes, especially managing pants, managing shirts, and bathing. Difficulty with tub transfers. However, roommates are men and pt is self conscious about them helping her        OT Problem List: Impaired balance (sitting and/or standing)      OT Treatment/Interventions:      OT Goals(Current goals can be found in the care plan section)    OT Frequency:     Barriers to D/C:            Co-evaluation              AM-PAC OT "6 Clicks" Daily Activity     Outcome Measure Help from another person eating meals?: None Help from another person taking care of personal grooming?: None Help from another person toileting, which includes using toliet, bedpan, or urinal?: None Help from another person bathing (including washing, rinsing, drying)?: None Help from another person to put on and taking off regular upper body clothing?: None Help from another person to put on and taking off regular lower body clothing?: None 6 Click Score: 24   End of Session Equipment Utilized During Treatment: Rolling walker (2 wheels);Gait belt  Activity Tolerance: Patient tolerated treatment well Patient left: in bed;with call bell/phone within reach  OT Visit Diagnosis: Other  abnormalities of gait and mobility (R26.89)                Time: 7253-6644 OT Time Calculation (min): 34 min Charges:  OT General Charges $OT Visit: 1 Visit OT Evaluation $OT Eval Low Complexity: 1 Low OT Treatments $Self Care/Home Management : 8-22 mins  Nestor Lewandowsky, OTR/L Acute Rehabilitation Services Pager: [phone] Office: [phone]   Malka So 04/20/2021, 1:40 PM

## 2021-04-20 NOTE — Progress Notes (Signed)
Internal Medicine Clinic Attending  Case discussed with Dr. Basaraba  At the time of the visit.  We reviewed the resident's history and exam and pertinent patient test results.  I agree with the assessment, diagnosis, and plan of care documented in the resident's note.  

## 2021-04-20 NOTE — TOC Progression Note (Addendum)
Transition of Care [facility]) - Progression Note    Patient Details  Name: Jean [name] MRN: [id number] Date of Birth: 1966-04-16  Transition of Care [facility]) CM/SW Contact  Zenon Mayo, RN Phone Number: 04/20/2021, 10:00 AM  Clinical Narrative:    NCM spoke with patient at bedside, she lives in a boarding house, she has a rollator.  She states she has no issues with getting her medications and would like for [facility] pharmacy to fill for her if she has any to be filled at discharge.  She states she will have transportation at discharge.  NCM offered choice for HHPT, she states she does not have a preference.  NCM made referral to [facility] with [facility], Awaiting call back. Ramond Marrow was not able to take referral.  NCM made referral to [facility] with [place], she is able to take referral for HHPT.         Expected Discharge Plan and Services                                                 Social Determinants of Health (SDOH) Interventions    Readmission Risk Interventions No flowsheet data found.

## 2021-04-20 NOTE — Plan of Care (Signed)

## 2021-04-20 NOTE — Evaluation (Addendum)
Physical Therapy Evaluation Patient Details Name: Jean [name] MRN: [id number] DOB: March 05, 1966 Today's Date: 04/20/2021  History of Present Illness  Pt is a 55 y.o. female who presented 04/18/21 with a near syncopal episode. Pt was hypertensive and bradycardic upon arrival. PMH: hypertension, GERD, depression, and asthma   Clinical Impression  Pt presents with condition above and deficits mentioned below, see PT Problem List. PTA, she was requiring light assistance/guarding to navigate stairs, but otherwise mod I using a rollator for household distance mobility. Pt reports having increased difficulty managing buttons, zippers, clothing, and bathing along with difficulty with tub transfers. She reports a hx of x2 falls in the past 6 months. She lives with 2 female roommates, who work, in a house with 3 STE, and receives PRN assistance from them and a neighbor. However, pt is self-conscious with males assisting her and believes her neighbor is bothered by her needing help. Currently, pt is at risk for falls, displaying deficits in lower extremity strength, balance, and activity tolerance. Pt with hx of L knee deficits impacting her gait fluidity also. Her BP also dropped when transferring to stand and she reported feeling lightheaded, also placing her at risk for falls. She is functioning at a min guard-supervision level using a rollator for distances up to ~48 ft at this time. Educated pt on use of 3-in-1 at night to use instead of long walk to bathroom and in shower for improved safety. Educated pt on installing ramp to improve independent access to community. Educated pt on safe practices with symptomatic orthostatic hypotension. Will continue to follow acutely. Pt would benefit from follow-up with HHPT and likely HHOT also, awaiting OT eval.    BP:  137/103 supine 144/115 sitting 112/90 standing with reports of being lightheaded     Recommendations for follow up therapy are one component of a  multi-disciplinary discharge planning process, led by the attending physician.  Recommendations may be updated based on patient status, additional functional criteria and insurance authorization.  Follow Up Recommendations Home health PT    Assistance Recommended at Discharge Intermittent Supervision/Assistance  Functional Status Assessment Patient has had a recent decline in their functional status and demonstrates the ability to make significant improvements in function in a reasonable and predictable amount of time.  Equipment Recommendations  BSC/3in1    Recommendations for Other Services OT consult     Precautions / Restrictions Precautions Precautions: Fall;Other (comment) Precaution Comments: monitor BP Restrictions Weight Bearing Restrictions: No      Mobility  Bed Mobility Overal bed mobility: Modified Independent             General bed mobility comments: Pt able to perform all bed mobility aspects without assistance.    Transfers Overall transfer level: Needs assistance Equipment used: Rollator (4 wheels) Transfers: Sit to/from Stand Sit to Stand: Min guard;Supervision           General transfer comment: Pt needing min guard-supervision for safety as pt displays some mild unsteadiness coming to stand, but no LOB, Locks brakes prior to transfers but pushes up on rollator to stand.    Ambulation/Gait Ambulation/Gait assistance: Min guard Gait Distance (Feet): 48 Feet Assistive device: Rollator (4 wheels) Gait Pattern/deviations: Step-through pattern;Decreased stance time - left;Decreased weight shift to left;Knees buckling;Trunk flexed;Narrow base of support Gait velocity: reduced Gait velocity interpretation: <1.31 ft/sec, indicative of household ambulator   General Gait Details: Pt with slow, narrow gait, maintaining trunk flexed posture. Mild unsteadiness as her L knee would "lock" up  and intermittently buckle, but pt able to catch self and maintain  balance with min guard. Pt reports she is due to get knee surgery soon.  Stairs            Wheelchair Mobility    Modified Rankin (Stroke Patients Only)       Balance Overall balance assessment: Needs assistance Sitting-balance support: No upper extremity supported;Feet supported Sitting balance-Leahy Scale: Good Sitting balance - Comments: Able to bring legs into figure-4 position to donn socks at EOB without LOB.   Standing balance support: Reliant on assistive device for balance Standing balance-Leahy Scale: Poor Standing balance comment: Reliant on rollator for mobility.                             Pertinent Vitals/Pain Pain Assessment: 0-10 Pain Score: 7  Pain Location: head Pain Descriptors / Indicators: Discomfort;Guarding Pain Intervention(s): Limited activity within patient's tolerance;Monitored during session;Repositioned    Home Living Family/patient expects to be discharged to:: Private residence Living Arrangements: Non-relatives/Friends Available Help at Discharge: Friend(s);Neighbor;Available PRN/intermittently Type of Home: House Home Access: Stairs to enter Entrance Stairs-Rails: Left (ascending) Entrance Stairs-Number of Steps: 3   Home Layout: One level Home Equipment: Conservation officer, nature (2 wheels);Rollator (4 wheels);Cane - single point;Cane - quad      Prior Function Prior Level of Function : Needs assist       Physical Assist : ADLs (physical);Mobility (physical) Mobility (physical): Stairs ADLs (physical): Bathing;Dressing Mobility Comments: x2 falls in past 6 months. Uses rollator primarily, needs light help/guarding help on stairs ADLs Comments: Reports needing more assistance lately with buttoning and zipping clothes, especially managing pants, managing shirts, and bathing. Difficulty with tub transfers. However, roommates are men and pt is self conscious about them helping her     Hand Dominance   Dominant Hand: Right     Extremity/Trunk Assessment   Upper Extremity Assessment Upper Extremity Assessment: Defer to OT evaluation    Lower Extremity Assessment Lower Extremity Assessment: Generalized weakness (MMT scores of 4 to 4+ grossly; denies numbness/tingling)    Cervical / Trunk Assessment Cervical / Trunk Assessment: Normal  Communication   Communication: No difficulties  Cognition Arousal/Alertness: Awake/alert Behavior During Therapy: WFL for tasks assessed/performed Overall Cognitive Status: Within Functional Limits for tasks assessed                                          General Comments General comments (skin integrity, edema, etc.): BP: 137/103 supine, 144/115 sitting, 112/90 standing with reports of being lightheaded; nausea throughout session, RN already aware; educated pt to wait before walking away from surface when transferring to stand to make sure she does not get dizzy and to perform exercises prior to standing to improve her BP and reduce risk of it dropping with standing    Exercises     Assessment/Plan    PT Assessment Patient needs continued PT services  PT Problem List Decreased strength;Decreased activity tolerance;Decreased balance;Decreased mobility;Cardiopulmonary status limiting activity       PT Treatment Interventions DME instruction;Gait training;Stair training;Functional mobility training;Therapeutic activities;Therapeutic exercise;Balance training;Neuromuscular re-education;Patient/family education    PT Goals (Current goals can be found in the Care Plan section)  Acute Rehab PT Goals Patient Stated Goal: to go out in the community more PT Goal Formulation: With patient Time For Goal Achievement: 05/04/21 Potential to  Achieve Goals: Good    Frequency Min 3X/week   Barriers to discharge        Co-evaluation               AM-PAC PT "6 Clicks" Mobility  Outcome Measure Help needed turning from your back to your side while in a  flat bed without using bedrails?: None Help needed moving from lying on your back to sitting on the side of a flat bed without using bedrails?: None Help needed moving to and from a bed to a chair (including a wheelchair)?: A Little Help needed standing up from a chair using your arms (e.g., wheelchair or bedside chair)?: A Little Help needed to walk in hospital room?: A Little Help needed climbing 3-5 steps with a railing? : A Little 6 Click Score: 20    End of Session   Activity Tolerance: Patient tolerated treatment well;Patient limited by fatigue Patient left: in bed;with call bell/phone within reach;with bed alarm set Nurse Communication: Mobility status;Other (comment) (vitals) PT Visit Diagnosis: Unsteadiness on feet (R26.81);Other abnormalities of gait and mobility (R26.89);Muscle weakness (generalized) (M62.81);History of falling (Z91.81);Difficulty in walking, not elsewhere classified (R26.2);Dizziness and giddiness (R42)    Time: 3016-0109 PT Time Calculation (min) (ACUTE ONLY): 21 min   Charges:   PT Evaluation $PT Eval Moderate Complexity: 1 Mod          Moishe Spice, PT, DPT Acute Rehabilitation Services  Pager: [phone] Office: [phone]   Orvan Falconer 04/20/2021, 8:34 AM

## 2021-04-20 NOTE — Progress Notes (Signed)
Progress Note  Patient Name: Jean [name] Date of Encounter: 04/20/2021  [facility] Cardiologist: None   Subjective   Continues to complain of intermittent nausea and cardiac sounding chest discomfort.  Inpatient Medications    Scheduled Meds:  amLODipine  10 mg Oral Daily   enoxaparin (LOVENOX) injection  40 mg Subcutaneous Q24H   feeding supplement  1 Container Oral TID BM   fluticasone furoate-vilanterol  1 puff Inhalation Daily   hydrochlorothiazide  25 mg Oral Daily   pantoprazole  40 mg Oral Daily   sertraline  50 mg Oral QHS   sodium chloride flush  3 mL Intravenous Once   Continuous Infusions:  PRN Meds: acetaminophen **OR** acetaminophen, albuterol, polyvinyl alcohol   Vital Signs    Vitals:   04/19/21 2202 04/20/21 0002 04/20/21 0429 04/20/21 0736  BP: (!) 153/92 (!) 172/88 135/82 132/74  Pulse: (!) 51 (!) 55 (!) 57 72  Resp: 18 18 18 18   Temp: 98.9 F (37.2 C) 98.1 F (36.7 C) 98.1 F (36.7 C) 98.3 F (36.8 C)  TempSrc: Oral Oral Oral Oral  SpO2: 99% 98% 100% 100%  Weight:  59.7 kg    Height:        Intake/Output Summary (Last 24 hours) at 04/20/2021 1047 Last data filed at 04/20/2021 0848 Gross per 24 hour  Intake 360 ml  Output --  Net 360 ml   Last 3 Weights 04/20/2021 04/19/2021 04/18/2021  Weight (lbs) 131 lb 11.2 oz 131 lb 2.8 oz 135 lb 1.6 oz  Weight (kg) 59.739 kg 59.5 kg 61.281 kg      Telemetry    NSR and ST - Personally Reviewed  ECG    No new - Personally Reviewed  Physical Exam  Healthy appearing GEN: No acute distress.   Neck: No JVD Cardiac: RRR, no murmurs, rubs, or gallops.  Respiratory: Clear to auscultation bilaterally. GI: Soft, nontender, non-distended  MS: No edema; No deformity. Neuro:  Nonfocal  Psych: Normal affect   Labs    High Sensitivity Troponin:   Recent Labs  Lab 04/18/21 2334 04/19/21 2255  TROPONINIHS 9 8     Chemistry Recent Labs  Lab 04/18/21 1658 04/18/21 1854  04/19/21 0352 04/20/21 0257  NA 141  --  136 133*  K 3.5  --  3.5 3.5  CL 103  --  103 99  CO2 27  --  24 25  GLUCOSE 123*  --  94 92  BUN 18  --  17 23*  CREATININE 1.28*  --  1.17* 1.45*  CALCIUM 10.4*  --  9.5 9.5  MG 2.5*  --   --   --   PROT  --  9.9*  --   --   ALBUMIN  --  4.5  --   --   AST  --  22  --   --   ALT  --  18  --   --   ALKPHOS  --  87  --   --   BILITOT  --  0.3  --   --   GFRNONAA 49*  --  54* 42*  ANIONGAP 11  --  9 9    Lipids No results for input(s): CHOL, TRIG, HDL, LABVLDL, LDLCALC, CHOLHDL in the last 168 hours.  Hematology Recent Labs  Lab 04/18/21 1658 04/19/21 0352 04/20/21 0257  WBC 9.1 7.5 6.7  RBC 4.29 4.01 4.11  HGB 13.4 12.8 12.9  HCT 42.9 39.2 39.4  MCV 100.0 97.8 95.9  MCH 31.2 31.9 31.4  MCHC 31.2 32.7 32.7  RDW 13.1 12.9 12.7  PLT 384 325 361   Thyroid  Recent Labs  Lab 04/18/21 2334  TSH 0.660    BNPNo results for input(s): BNP, PROBNP in the last 168 hours.  DDimer No results for input(s): DDIMER in the last 168 hours.   Radiology    DG Chest 2 View  Result Date: 04/18/2021 CLINICAL DATA:  Chest pain, dyspnea, nausea and vomiting EXAM: CHEST - 2 VIEW COMPARISON:  02/16/2020 FINDINGS: Frontal and lateral views of the chest demonstrate a stable cardiac silhouette. No acute airspace disease, effusion, or pneumothorax. No acute bony abnormalities. IMPRESSION: 1. No acute intrathoracic process. Electronically Signed   By: Randa Ngo M.D.   On: 04/18/2021 17:42   CT Head Wo Contrast  Result Date: 04/18/2021 CLINICAL DATA:  Headache EXAM: CT HEAD WITHOUT CONTRAST TECHNIQUE: Contiguous axial images were obtained from the base of the skull through the vertex without intravenous contrast. COMPARISON:  MRI brain 11/18/2017, CT brain 11/18/2017 FINDINGS: Brain: No evidence of acute infarction, hemorrhage, hydrocephalus, extra-axial collection or mass lesion/mass effect. Convex prominence of pituitary gland but similar compared  to MRI from 2019. Vascular: No hyperdense vessel or unexpected calcification. Skull: Normal. Negative for fracture or focal lesion. Sinuses/Orbits: No acute finding. Other: None IMPRESSION: Negative non contrasted CT appearance of the brain Electronically Signed   By: Donavan Foil M.D.   On: 04/18/2021 18:10   ECHOCARDIOGRAM COMPLETE  Result Date: 04/19/2021    ECHOCARDIOGRAM REPORT   Patient Name:   Jean [name] Date of Exam: 04/19/2021 Medical Rec #:  [id number]        Height:       64.0 in Accession #:    [id number]       Weight:       135.1 lb Date of Birth:  1965-06-03        BSA:          1.656 m Patient Age:    55 years         BP:           149/96 mmHg Patient Gender: F                HR:           57 bpm. Exam Location:  Inpatient Procedure: 2D Echo, Cardiac Doppler and Color Doppler Indications:    R94.31 Abnormal EKG  History:        Patient has prior history of Echocardiogram examinations, most                 recent 11/11/2018. COPD, Arrythmias:Bradycardia,                 Signs/Symptoms:Syncope and Dyspnea; Risk Factors:Hypertension.  Sonographer:    Glo Herring Referring Phys: [place]  1. Left ventricular ejection fraction, by estimation, is 60 to 65%. The left ventricle has normal function. The left ventricle has no regional wall motion abnormalities. Left ventricular diastolic parameters are consistent with Grade I diastolic dysfunction (impaired relaxation). There is chordal SAM with LVOT gradient of 11 mm Hg, increasing to 18 mm Hg with Valsalva.  2. Right ventricular systolic function is normal. The right ventricular size is normal. Tricuspid regurgitation signal is inadequate for assessing PA pressure.  3. The mitral valve is grossly normal. No evidence of mitral valve regurgitation. No evidence of mitral stenosis.  4. The  aortic valve is tricuspid. There is moderate calcification of the aortic valve. Aortic valve regurgitation is not visualized. Aortic valve  sclerosis is present, with no evidence of aortic valve stenosis. Aortic valve area, by VTI measures 1.98 cm. Aortic valve mean gradient measures 7.0 mmHg. Comparison(s): No significant change from prior study. FINDINGS  Left Ventricle: Left ventricular ejection fraction, by estimation, is 60 to 65%. The left ventricle has normal function. The left ventricle has no regional wall motion abnormalities. The left ventricular internal cavity size was small. There is borderline concentric left ventricular hypertrophy. Left ventricular diastolic parameters are consistent with Grade I diastolic dysfunction (impaired relaxation). Right Ventricle: The right ventricular size is normal. No increase in right ventricular wall thickness. Right ventricular systolic function is normal. Tricuspid regurgitation signal is inadequate for assessing PA pressure. Left Atrium: Left atrial size was normal in size. Right Atrium: Right atrial size was normal in size. Pericardium: There is no evidence of pericardial effusion. Mitral Valve: The mitral valve is grossly normal. No evidence of mitral valve regurgitation. No evidence of mitral valve stenosis. Tricuspid Valve: The tricuspid valve is normal in structure. Tricuspid valve regurgitation is not demonstrated. No evidence of tricuspid stenosis. Aortic Valve: The aortic valve is tricuspid. There is moderate calcification of the aortic valve. There is moderate aortic valve annular calcification. Aortic valve regurgitation is not visualized. Aortic valve sclerosis is present, with no evidence of aortic valve stenosis. Aortic valve mean gradient measures 7.0 mmHg. Aortic valve peak gradient measures 11.4 mmHg. Aortic valve area, by VTI measures 1.98 cm. Pulmonic Valve: The pulmonic valve was not well visualized. Pulmonic valve regurgitation is not visualized. No evidence of pulmonic stenosis. Aorta: The aortic root and ascending aorta are structurally normal, with no evidence of dilitation.  IAS/Shunts: The atrial septum is grossly normal.  LEFT VENTRICLE PLAX 2D LVIDd:         3.70 cm     Diastology LVIDs:         2.50 cm     LV e' medial:    5.44 cm/s LV PW:         1.00 cm     LV E/e' medial:  11.7 LV IVS:        1.00 cm     LV e' lateral:   7.14 cm/s LVOT diam:     1.90 cm     LV E/e' lateral: 8.9 LV SV:         67 LV SV Index:   40 LVOT Area:     2.84 cm  LV Volumes (MOD) LV vol d, MOD A2C: 83.3 ml LV vol d, MOD A4C: 81.4 ml LV vol s, MOD A2C: 37.5 ml LV vol s, MOD A4C: 37.8 ml LV SV MOD A2C:     45.8 ml LV SV MOD A4C:     81.4 ml LV SV MOD BP:      44.8 ml RIGHT VENTRICLE RV Basal diam:  3.00 cm RV S prime:     15.90 cm/s LEFT ATRIUM             Index        RIGHT ATRIUM           Index LA diam:        3.20 cm 1.93 cm/m   RA Area:     13.30 cm LA Vol (A2C):   30.6 ml 18.48 ml/m  RA Volume:   28.00 ml  16.91 ml/m LA Vol (A4C):  33.8 ml 20.41 ml/m LA Biplane Vol: 33.5 ml 20.23 ml/m  AORTIC VALVE                     PULMONIC VALVE AV Area (Vmax):    2.15 cm      PV Vmax:       1.11 m/s AV Area (Vmean):   1.86 cm      PV Peak grad:  4.9 mmHg AV Area (VTI):     1.98 cm AV Vmax:           169.00 cm/s AV Vmean:          126.000 cm/s AV VTI:            0.338 m AV Peak Grad:      11.4 mmHg AV Mean Grad:      7.0 mmHg LVOT Vmax:         128.00 cm/s LVOT Vmean:        82.700 cm/s LVOT VTI:          0.236 m LVOT/AV VTI ratio: 0.70  AORTA Ao Root diam: 2.70 cm Ao Asc diam:  3.00 cm MITRAL VALVE MV Area (PHT): 3.17 cm    SHUNTS MV Decel Time: 239 msec    Systemic VTI:  0.24 m MV E velocity: 63.70 cm/s  Systemic Diam: 1.90 cm MV A velocity: 65.20 cm/s MV E/A ratio:  0.98 Rudean Haskell MD Electronically signed by Rudean Haskell MD Signature Date/Time: 04/19/2021/3:29:32 PM    Final     Cardiac Studies   As noted above, echo demonstrates normal LV systolic function, grade 1 diastolic dysfunction, and LV outflow tract obstruction, 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  Patient  Profile     55 y.o. female  a hx of HTN, OA, asthma, chronic pain issues, who is being seen 04/19/2021 for the evaluation of bradycardia and near-syncope.  Assessment & Plan    Chest pain, uncertain cause. Sounds non cardiac. Consider OP nuclear or Cor CTA Bradycardia requires no therapy at this time. Near syncope: I favor vagal etiology and recommend no further evaluation. [facility] will sign off.   Medication Recommendations:  OP stress test Other recommendations (labs, testing, etc):  Ambulate Follow up as an outpatient:  TBD  For questions or updates, please contact [place] Please consult [email or web address] for contact info under        Signed, Sinclair Grooms, MD  04/20/2021, 10:47 AM

## 2021-04-21 LAB — BASIC METABOLIC PANEL
Anion gap: 12 (ref 5–15)
BUN: 27 mg/dL — ABNORMAL HIGH (ref 6–20)
CO2: 23 mmol/L (ref 22–32)
Calcium: 9.5 mg/dL (ref 8.9–10.3)
Chloride: 103 mmol/L (ref 98–111)
Creatinine, Ser: 1.37 mg/dL — ABNORMAL HIGH (ref 0.44–1.00)
GFR, Estimated: 45 mL/min — ABNORMAL LOW (ref >60)
Glucose, Bld: 117 mg/dL — ABNORMAL HIGH (ref 70–99)
Potassium: 3.1 mmol/L — ABNORMAL LOW (ref 3.5–5.1)
Sodium: 138 mmol/L (ref 135–145)

## 2021-04-21 MED ORDER — POTASSIUM CHLORIDE 20 MEQ PO PACK
40.0000 meq | PACK | Freq: Once | ORAL | Status: AC
  Administered 2021-04-21: 40 meq via ORAL
  Filled 2021-04-21: qty 2

## 2021-04-21 NOTE — Discharge Summary (Signed)
Name: Jean [name] MRN: [id number] DOB: [birth year] 55 y.o. PCP: Jose Persia, MD  Date of Admission: 04/18/2021  4:39 PM Date of Discharge: 04/21/2021 Attending Physician: Aldine Contes, MD  Discharge Diagnosis: 1. Symptomatic bradycardia 2. Hypertension 3. CKD 3a  Discharge Medications: Allergies as of 04/21/2021       Reactions   Lisinopril Swelling, Other (See Comments)   Lip swelling, patient reported - unable to confirm from chart         Medication List     STOP taking these medications    divalproex 500 MG 24 hr tablet Commonly known as: Depakote ER   hydrochlorothiazide 25 MG tablet Commonly known as: HYDRODIURIL   ibuprofen 600 MG tablet Commonly known as: ADVIL   losartan 25 MG tablet Commonly known as: COZAAR   Magnesium Gluconate 250 MG Tabs       TAKE these medications    acetaminophen 500 MG tablet Commonly known as: TYLENOL Take 500 mg by mouth every 6 (six) hours as needed for mild pain.   albuterol 108 (90 Base) MCG/ACT inhaler Commonly known as: VENTOLIN HFA INHALE 2 PUFFS BY MOUTH EVERY 6 HOURS AS NEEDED FOR COUGHING, WHEEZING, OR SHORTNESS OF BREATH What changed:  how much to take how to take this when to take this reasons to take this additional instructions   amLODipine 10 MG tablet Commonly known as: NORVASC Take 1 tablet (10 mg total) by mouth daily.   cyclobenzaprine 10 MG tablet Commonly known as: FLEXERIL TAKE 1 TABLET BY MOUTH TWICE A DAY AS NEEDED FOR MUSCLE SPASM What changed: See the new instructions.   gabapentin 300 MG capsule Commonly known as: NEURONTIN TAKE 1 CAPSULE BY MOUTH EVERYDAY AT BEDTIME What changed: See the new instructions.   pantoprazole 40 MG tablet Commonly known as: PROTONIX TAKE 1 TABLET BY MOUTH EVERY DAY What changed: how to take this   sertraline 100 MG tablet Commonly known as: ZOLOFT TAKE 1 TABLET BY MOUTH EVERY DAY   Symbicort 80-4.5 MCG/ACT inhaler Generic  drug: budesonide-formoterol TAKE 2 PUFFS BY MOUTH TWICE A DAY What changed: See the new instructions.   tetrahydrozoline 0.05 % ophthalmic solution Place 1 drop into both eyes daily as needed (for dry eyes).               Durable Medical Equipment  (From admission, onward)           Start     Ordered   04/21/21 1155  For home use only DME Bedside commode  Once       Question:  Patient needs a bedside commode to treat with the following condition  Answer:  Weakness   04/21/21 1155            Disposition and follow-up:   [name] was discharged from [facility] in Stable condition.  At the hospital follow up visit please address:  1.  Assess orthostatics and consider addition of a non-diuretic anti-hypertensive.   2.  Labs / imaging needed at time of follow-up: NA  3.  Pending labs/ test needing follow-up: NA  Follow-up Appointments:  Follow-up Information     [facility]. Go on 05/04/2021.   Why: @9 :45am Contact information: [address] [place], [place] Follow up.   Specialty: Home Health Services Why: HHPT Contact information: [address] [place] [address] [phone]  [facility], [facility] Follow up.   Why: bedside commode Contact information: [place] [address] [phone]                 Hospital Course by problem list: 1. Patient was at the internal medicine clinic, when she stood up from her chair and felt lightheaded. Her heart rate was found to be in the 96'G with systolic blood pressure around 200. She was sent to the ED for further evaluation. EKG showed sinus bradycardia with no PR prolongation. Chart review was notable for previous documented heart rates in the 50's and 60's. Echo was unremarkable with a preserved EF and no valvular disease. Cardiology was  consulted and assessed the patient. Their belief is that the patient had a vagal episode. They recommended no further workup. Patient had orthostatic vital signs that were positive multiple times. She was given 2L of fluids. Orthostatic vital signs were negative the next day and the patient was able to ambulate with PT with minimal feelings of lightheadedness. She was discharged with instruction to discontinue her HCTZ until hospital follow up. It was felt this medication may have been contributing her orthostasis.   2. Hypertension  Questionable medication compliance to home regimen of amlodipine and HCTZ. BP improved to systolic of 836-629 after a few days of restarting her home medications.   3. CKD 3a Chronic and stable throughout admission.   Discharge Exam:   BP 112/68 (BP Location: Left Arm)    Pulse 82    Temp 98.3 F (36.8 C) (Oral)    Resp 19    Ht 5\' 4"  (1.626 m)    Wt 60.5 kg    LMP  (LMP Unknown)    SpO2 100%    BMI 22.88 kg/m  Discharge exam:  General: resting in bed, NAD Cardiac: RRR, no rubs, murmurs or gallops Pulm: CTAB, normal air movement Abd: NTTP, +bs Ext: warm and well perfused, no pedal edema Neuro: alert and oriented X3  Pertinent Labs, Studies, and Procedures:  Orthostatic vitals and echo as above  Discharge Instructions: You were admitted for an episode of almost passing out. Your heart rate was found to be quite low, 40 beats per minute. You were seen by the cardiologists who felt that there was no significant heart problem causing this. It appears you have had low heart rates previously. During this hospitalization we found that you likely had dehydration, leading to a low blood pressure upon standing. We think this likely caused your episode of almost passing out. We discontinued one of your diuretic medications, HCTZ. Please do not take this medication until you discuss it with your doctor at your hospital follow up. Please follow up with the [facility] (Dr. Charleen Kirks)  within one week.   Signed: Corky Sox, MD PGY-1

## 2021-04-21 NOTE — Discharge Instructions (Signed)
You were admitted for an episode of almost passing out. Your heart rate was found to be quite low, 40 beats per minute. You were seen by the cardiologists who felt that there was no significant heart problem causing this. It appears you have had low heart rates previously. During this hospitalization we found that you likely had dehydration, leading to a low blood pressure upon standing. We think this likely caused your episode of almost passing out. We discontinued one of your diuretic medications, HCTZ. Please do not take this medication until you discuss it with your doctor at your hospital follow up. Please follow up with the [facility] (Dr. Charleen Kirks) within one week.  Corky Sox, MD

## 2021-04-21 NOTE — Progress Notes (Signed)
Physical Therapy Treatment Patient Details Name: Jean [name] MRN: [id number] DOB: 26-Dec-1965 Today's Date: 04/21/2021   History of Present Illness Pt is a 55 y.o. female who presented 04/18/21 with a near syncopal episode. Pt was hypertensive and bradycardic upon arrival. PMH: hypertension, GERD, depression, and asthma    PT Comments    Pt is making good progress with mobility, displaying improved stability and fluidity with gait this date. However, her L knee did get "stuck"/locked when trying to ascend stairs with reciprocal pattern, thus educated pt on leading up with R leg and down with L with step-to pattern, good success and reduced discomfort noted. Pt able to perform all mobility at a min guard-supervision level with UE support. See General Comments below in regards to orthostatics and other vitals. Will continue to follow acutely. Current recommendations remain appropriate.    Recommendations for follow up therapy are one component of a multi-disciplinary discharge planning process, led by the attending physician.  Recommendations may be updated based on patient status, additional functional criteria and insurance authorization.  Follow Up Recommendations  Home health PT     Assistance Recommended at Discharge Intermittent Supervision/Assistance  Equipment Recommendations  BSC/3in1    Recommendations for Other Services       Precautions / Restrictions Precautions Precautions: Fall;Other (comment) Precaution Comments: monitor BP Restrictions Weight Bearing Restrictions: No     Mobility  Bed Mobility Overal bed mobility: Modified Independent             General bed mobility comments: Pt able to perform all bed mobility aspects without assistance.    Transfers Overall transfer level: Needs assistance Equipment used: Rollator (4 wheels) Transfers: Sit to/from Stand Sit to Stand: Supervision           General transfer comment: Supervision for safety,  appropriately applies rollator brakes, no LOB.    Ambulation/Gait Ambulation/Gait assistance: Min guard Gait Distance (Feet): 50 Feet Assistive device: Rollator (4 wheels) Gait Pattern/deviations: Step-through pattern;Decreased stance time - left;Trunk flexed;Narrow base of support Gait velocity: reduced Gait velocity interpretation: <1.31 ft/sec, indicative of household ambulator   General Gait Details: Pt with slow, narrow gait, maintaining trunk flexed posture. Improved fluidity and steadiness with gait today, no knee buckling, min guard for safety.   Stairs Stairs: Yes Stairs assistance: Min guard Stair Management: One rail Left;One rail Right;Step to pattern;Forwards Number of Stairs: 3 General stair comments: Ascends with L rail and descends with R rail, when ascending her L knee locked up, thus educated on step-to pattern leading up with R and down with L. Found good success with this technique. No LOB, min guard for safety.   Wheelchair Mobility    Modified Rankin (Stroke Patients Only)       Balance Overall balance assessment: Needs assistance Sitting-balance support: No upper extremity supported;Feet supported Sitting balance-Leahy Scale: Good Sitting balance - Comments: Able to bring legs into figure-4 position to donn socks at EOB without LOB.   Standing balance support: Reliant on assistive device for balance Standing balance-Leahy Scale: Poor Standing balance comment: Reliant on rollator for mobility.                            Cognition Arousal/Alertness: Awake/alert Behavior During Therapy: WFL for tasks assessed/performed Overall Cognitive Status: Within Functional Limits for tasks assessed  Exercises      General Comments General comments (skin integrity, edema, etc.): BP 180/73 supine, 182/95 sitting, 172/88 standing, 180/114 standing a couple minutes (could not tolerate full 3  min to wait due to fatigue). She does report lightheadedness still today with changing positions, more so from supine > sit. HR 54-79. SPO2 98% RA      Pertinent Vitals/Pain Pain Assessment: Faces Faces Pain Scale: Hurts little more Pain Location: L neck Pain Descriptors / Indicators: Discomfort;Guarding Pain Intervention(s): Monitored during session;Limited activity within patient's tolerance;Repositioned;Other (comment) (educated on stretching)    Home Living                          Prior Function            PT Goals (current goals can now be found in the care plan section) Acute Rehab PT Goals Patient Stated Goal: to go home PT Goal Formulation: With patient Time For Goal Achievement: 05/04/21 Potential to Achieve Goals: Good Progress towards PT goals: Progressing toward goals    Frequency    Min 3X/week      PT Plan Current plan remains appropriate    Co-evaluation              AM-PAC PT "6 Clicks" Mobility   Outcome Measure  Help needed turning from your back to your side while in a flat bed without using bedrails?: None Help needed moving from lying on your back to sitting on the side of a flat bed without using bedrails?: None Help needed moving to and from a bed to a chair (including a wheelchair)?: A Little Help needed standing up from a chair using your arms (e.g., wheelchair or bedside chair)?: A Little Help needed to walk in hospital room?: A Little Help needed climbing 3-5 steps with a railing? : A Little 6 Click Score: 20    End of Session   Activity Tolerance: Patient tolerated treatment well Patient left: in bed;with call bell/phone within reach Nurse Communication: Mobility status PT Visit Diagnosis: Unsteadiness on feet (R26.81);Other abnormalities of gait and mobility (R26.89);Muscle weakness (generalized) (M62.81);History of falling (Z91.81);Difficulty in walking, not elsewhere classified (R26.2);Dizziness and giddiness (R42)      Time: 4628-6381 PT Time Calculation (min) (ACUTE ONLY): 25 min  Charges:  $Gait Training: 23-37 mins                     Moishe Spice, PT, DPT Acute Rehabilitation Services  Pager: [phone] Office: [place] 04/21/2021, 10:57 AM

## 2021-04-21 NOTE — Consult Note (Signed)
° °  [facility] Inpatient Consult   04/21/2021  Jean [name] Jun 21, 1965 [id number]  Managed Medicaid [MM]: Jean [name]  Patient referred for post hospital follow up.  Met with patient at bedside.  Patient was explained about having the MM team for follow up.  Primary Care Provider:  Jose Persia, MD [facility]  Met with patient who was resting with room very dark. Explained to patient regarding MM follow up eligibility. She states she gets Physicist, medical, she has transportation today but has issues with transportation at times.  She denies issues with medications except getting there to pick them up sometimes. She is currently agreeable for MM follow up. Awaiting daughter to pick up today.  Plan:  Will return for post hospital follow up with the MM team.  Jean Brood, RN BSN [place] [facility] Liaison  [phone] business mobile phone Toll free office [phone]  Fax number: [phone] [email or web address]

## 2021-04-21 NOTE — TOC Transition Note (Signed)
Transition of Care [facility]) - CM/SW Discharge Note   Patient Details  Name: Jean [name] MRN: [id number] Date of Birth: [birth year]  Transition of Care [facility]) CM/SW Contact:  Zenon Mayo, RN Phone Number: 04/21/2021, 11:58 AM   Clinical Narrative:    She is set up with [facility] for [place] will deliver [facility] to room prior to dc.  Patient states her daughter will transport her home today.  [facility] will follow as well as outpatient.   Final next level of care: [place] Barriers to Discharge: No Barriers Identified   Patient Goals and CMS Choice Patient states their goals for this hospitalization and ongoing recovery are:: return home CMS Medicare.gov Compare Post Acute Care list provided to:: Patient Choice offered to / list presented to : Patient  Discharge Placement                       Discharge Plan and Services                DME Arranged: Bedside commode DME Agency: [employer] Date DME Agency Contacted: 04/21/21 Time DME Agency Contacted: [id number] Representative spoke with at DME Agency: [place]: PT [place]: [place] Date [place]: 04/21/21 Time [place]: 7782 Representative spoke with at [place]: [place] ([place]) Interventions     Readmission Risk Interventions No flowsheet data found.

## 2021-04-22 ENCOUNTER — Other Ambulatory Visit: Payer: Self-pay | Admitting: Internal Medicine

## 2021-04-22 DIAGNOSIS — R0609 Other forms of dyspnea: Secondary | ICD-10-CM

## 2021-04-26 ENCOUNTER — Encounter: Payer: Medicaid Other | Admitting: Internal Medicine

## 2021-05-02 ENCOUNTER — Other Ambulatory Visit: Payer: Self-pay

## 2021-05-02 NOTE — Patient Instructions (Signed)
[name] ,   The [facility] Managed Care Team is available to provide assistance to you with your healthcare needs at no cost and as a benefit of your [facility] Health plan. I'm sorry I was unable to reach you today for our scheduled appointment. Our care guide will call you to reschedule our telephone appointment. Please call me at the number below. I am available to be of assistance to you regarding your healthcare needs. .   Thank you,   Salvatore Marvel RN, BSN [facility] Coordinator [place] [facility] Mobile: [phone]

## 2021-05-02 NOTE — Patient Outreach (Signed)
Care Coordination  05/02/2021  Jean [name] Mar 27, 1966 [id number]  05/02/2021 Name: Jean [name] MRN: [id number] DOB: 03-05-1966  Referred by: Jose Persia, MD Reason for referral : High Risk Managed Medicaid (Unsuccessful Telephone Outreach)   An unsuccessful telephone outreach was attempted today. The patient was referred to the case management team for assistance with care management and care coordination.    Follow Up Plan: A HIPAA compliant phone message was left for the patient providing contact information and requesting a return call.  This [place] received a return phone call from the patient late in the day, which would not allow adequate time for the Initial Visit.  The patient agreed to reschedule today's Initial visit to next week.  The Managed Medicaid care management team will reach out to the patient again over the next 7 days.   Salvatore Marvel RN, BSN Community Care Coordinator [place] [facility] Mobile: [phone]

## 2021-05-03 ENCOUNTER — Telehealth: Payer: Self-pay | Admitting: Internal Medicine

## 2021-05-03 NOTE — Telephone Encounter (Signed)
.. °  Medicaid Managed Care   Unsuccessful Outreach Note  05/03/2021 Name: Jean [name] MRN: [id number] DOB: 1965-09-18  Referred by: Jose Persia, MD Reason for referral : High Risk Managed Medicaid (I called the patient today to get her phone visit with the MM RNCM rescheduled. I left my name and number on her VM.)   An unsuccessful telephone outreach was attempted today. The patient was referred to the case management team for assistance with care management and care coordination.   Follow Up Plan: The care management team will reach out to the patient again over the next 7 days.   [place]

## 2021-05-04 ENCOUNTER — Telehealth: Payer: Self-pay | Admitting: *Deleted

## 2021-05-04 ENCOUNTER — Other Ambulatory Visit: Payer: Self-pay

## 2021-05-04 ENCOUNTER — Ambulatory Visit (INDEPENDENT_AMBULATORY_CARE_PROVIDER_SITE_OTHER): Payer: Medicaid Other | Admitting: Internal Medicine

## 2021-05-04 ENCOUNTER — Encounter: Payer: Self-pay | Admitting: Internal Medicine

## 2021-05-04 VITALS — BP 112/79 | HR 74 | Temp 98.2°F | Ht 64.0 in | Wt 137.4 lb

## 2021-05-04 DIAGNOSIS — Z1211 Encounter for screening for malignant neoplasm of colon: Secondary | ICD-10-CM | POA: Diagnosis not present

## 2021-05-04 DIAGNOSIS — R519 Headache, unspecified: Secondary | ICD-10-CM | POA: Diagnosis not present

## 2021-05-04 DIAGNOSIS — N1831 Chronic kidney disease, stage 3a: Secondary | ICD-10-CM | POA: Diagnosis not present

## 2021-05-04 DIAGNOSIS — R001 Bradycardia, unspecified: Secondary | ICD-10-CM | POA: Diagnosis not present

## 2021-05-04 DIAGNOSIS — K5900 Constipation, unspecified: Secondary | ICD-10-CM | POA: Diagnosis not present

## 2021-05-04 DIAGNOSIS — R634 Abnormal weight loss: Secondary | ICD-10-CM

## 2021-05-04 DIAGNOSIS — R55 Syncope and collapse: Secondary | ICD-10-CM | POA: Diagnosis not present

## 2021-05-04 DIAGNOSIS — G8929 Other chronic pain: Secondary | ICD-10-CM | POA: Diagnosis not present

## 2021-05-04 DIAGNOSIS — I129 Hypertensive chronic kidney disease with stage 1 through stage 4 chronic kidney disease, or unspecified chronic kidney disease: Secondary | ICD-10-CM | POA: Diagnosis not present

## 2021-05-04 DIAGNOSIS — J449 Chronic obstructive pulmonary disease, unspecified: Secondary | ICD-10-CM

## 2021-05-04 DIAGNOSIS — I10 Essential (primary) hypertension: Secondary | ICD-10-CM

## 2021-05-04 DIAGNOSIS — Z1231 Encounter for screening mammogram for malignant neoplasm of breast: Secondary | ICD-10-CM

## 2021-05-04 MED ORDER — ALBUTEROL SULFATE HFA 108 (90 BASE) MCG/ACT IN AERS: 2.0000 | INHALATION_SPRAY | Freq: Four times a day (QID) | RESPIRATORY_TRACT | 0 refills | Status: DC | PRN

## 2021-05-04 MED ORDER — DIVALPROEX SODIUM ER 500 MG PO TB24: 500.0000 mg | ORAL_TABLET | Freq: Every day | ORAL | 0 refills | Status: DC

## 2021-05-04 NOTE — Progress Notes (Signed)
° °  CC: Hospital follow up; HTN; COPD  HPI:  [name] is a 55 y.o. with a PMHx as listed below who presents to the clinic for Hospital follow up; HTN; COPD.   Please see the Encounters tab for problem-based Assessment & Plan regarding status of patient's acute and chronic conditions.  Past Medical History:  Diagnosis Date   Adnexal mass 2019   likely remnant fibroid on broad ligament; getting diagnostic lap   Anxiety    Arthritis    hip   Asthma    Carpal tunnel syndrome    bilateral   Chest wall pain 10/15/2016   Chronic lower back pain    Depression    Dyspnea    with exertion   GERD (gastroesophageal reflux disease)    Headache    migraines   Herpes    Hypertension    Liver lesion, right lobe 12/01/2015   CT chest without contrast 11/30/15 with hepatic lesions measuring 2.0 x 1.7 cm of the right lobe and 1 x 1 cm on the dome. 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 Negative for chills and fever.  Respiratory:  Negative for shortness of breath and wheezing.   Cardiovascular:  Negative for chest pain, palpitations and leg swelling.  Gastrointestinal:  Positive for constipation. Negative for abdominal pain, diarrhea, nausea and vomiting.   Physical Exam:  Vitals:   05/04/21 1013  BP: 112/79  Pulse: 74  Temp: 98.2 F (36.8 C)  TempSrc: Oral  SpO2: 97%  Weight: 137 lb 6.4 oz (62.3 kg)   Height: 5\' 4"  (1.626 m)   Physical Exam Constitutional:      Appearance: She is normal weight.  HENT:     Head: Normocephalic and atraumatic.  Cardiovascular:     Rate and Rhythm: Normal rate and regular rhythm.     Heart sounds: No murmur heard. Pulmonary:     Effort: Pulmonary effort is normal. No respiratory distress.     Breath sounds: Normal breath sounds. No wheezing, rhonchi or rales.  Abdominal:     General: Bowel sounds are normal.     Palpations: Abdomen is soft.  Musculoskeletal:     Right lower leg: No edema.     Left lower leg: No edema.  Skin:    General: Skin is warm and dry.  Neurological:     General: No focal deficit present.     Mental Status: She is alert and oriented to person, place, and time.  Psychiatric:        Mood and Affect: Mood normal.        Behavior: Behavior normal.    Assessment & Plan:   See Encounters Tab for problem based charting.  Patient discussed with Dr. Heber Jolley

## 2021-05-04 NOTE — Telephone Encounter (Signed)
Called patient left voice message for patient to return call with her NCDL # so paper work to be completed for parking card/ handicap.

## 2021-05-04 NOTE — Telephone Encounter (Signed)
Call from [place], [place]. Stated she did evaluation yesterday. Requesting verbal order for "HHPT once a week x 8 weeks and SW evaluation". VO given -if not appropriate, let me know. Pt has an appt this am. Thanks

## 2021-05-04 NOTE — Patient Instructions (Addendum)
It was nice seeing you today! Thank you for choosing [facility] for your Primary Care.    Today we talked about:   Headaches: I have restarted your Depakote. Take 1 tablet daily.   Blood Pressure: Continue to take only Amlodipine, once daily.   Kidney Concern: Your kidney function is stable. We will recheck in 6 months.   Constipation: I recommend Miralax every other day.   COPD: I have refilled your Albuterol. You do not need to continue using Breo.   I have ordered mammogram and colonoscopy. For the mamm[facility], [facility] schedule. For the colonoscopy, the office will call you.

## 2021-05-09 ENCOUNTER — Telehealth: Payer: Self-pay | Admitting: Internal Medicine

## 2021-05-09 ENCOUNTER — Telehealth: Payer: Self-pay | Admitting: *Deleted

## 2021-05-09 DIAGNOSIS — R634 Abnormal weight loss: Secondary | ICD-10-CM | POA: Insufficient documentation

## 2021-05-09 DIAGNOSIS — R519 Headache, unspecified: Secondary | ICD-10-CM | POA: Insufficient documentation

## 2021-05-09 DIAGNOSIS — K5900 Constipation, unspecified: Secondary | ICD-10-CM | POA: Insufficient documentation

## 2021-05-09 NOTE — Assessment & Plan Note (Signed)
Jean [name] states she has a long-term history of constipation and is requesting recommendations today.  She has a bowel movement approximately 3 times a week.  Assessment/plan: Recommended increasing daily fiber intake and as needed MiraLAX.

## 2021-05-09 NOTE — Assessment & Plan Note (Signed)
Jean [name] has a long-term history of chronic headaches, mixed migraine and tension type, previously following Neurology. She was started on Depakote in 2020 with moderate response.  Ms. Strahan is unsure why this medication was discontinued and is requesting it be restarted due to recurrence of headaches.  Assessment/plan: Per chart review, Depakote expired and was never discontinued.  Will restart today at 500 mg daily and plan to uptitrate slowly based on response  - Start Depakote 500 mg daily

## 2021-05-09 NOTE — Assessment & Plan Note (Signed)
Recent hospitalization for near syncope felt to be due to symptomatic bradycardia versus vasovagal versus orthostatic.  Orthostatic vital signs were positive but resolved with administration of IV fluids and discontinuation of her diuretic.  Today, Jean [name] endorses continued dizziness but states that it happens intermittently and randomly.  She has not found a pattern.  She states that it happens both at rest and with standing.  She uses a walker at baseline and does not feel that this dizziness will lead to falls.  -Continue to monitor at future visits

## 2021-05-09 NOTE — Assessment & Plan Note (Signed)
Recent hospitalization for symptomatic bradycardia.  Heart rate today within normal limits.  We will continue to monitor at future visits.  No further intervention indicated at this time.

## 2021-05-09 NOTE — Assessment & Plan Note (Signed)
Longstanding history of CKD stage IIIa, likely secondary to underlying hypertension.  Patient I had a long discussion regarding her CKD today; all questions and concerns addressed.  -BMP pending

## 2021-05-09 NOTE — Assessment & Plan Note (Signed)
During recent hospitalization, HCTZ and losartan were discontinued.  Today, patient is taking amlodipine only.  She denies any difficulty with this at this time.  Assessment/plan: BP: 112/79   Blood pressure well controlled with amlodipine only.  We will continue with current regimen without any changes.  BMP was obtained today as well.  - Amlodipine 10 mg daily - BMP pending

## 2021-05-09 NOTE — Progress Notes (Signed)
Internal Medicine Clinic Attending  Case discussed with Dr. Basaraba  At the time of the visit.  We reviewed the resident's history and exam and pertinent patient test results.  I agree with the assessment, diagnosis, and plan of care documented in the resident's note.  

## 2021-05-09 NOTE — Assessment & Plan Note (Addendum)
Jean [name] states she needs a refill on her albuterol.  She notes that while in the hospital she was on additional inhaler called Breo and was unsure if she continued to require this.  Assessment/plan: At this time, will continue with albuterol as needed only.  - Albuterol refilled

## 2021-05-09 NOTE — Telephone Encounter (Signed)
.. °  Medicaid Managed Care   Unsuccessful Outreach Note  05/09/2021 Name: Jean [name] MRN: [id number] DOB: [birth year]  Referred by: Jose Persia, MD Reason for referral : High Risk Managed Medicaid (I called the patient today to get her phone visit with the [facility] RNCM rescheduled. I left my name and number on her VM.)   A second unsuccessful telephone outreach was attempted today. The patient was referred to the case management team for assistance with care management and care coordination.   Follow Up Plan: The care management team will reach out to the patient again over the next 7 days.   [place]

## 2021-05-09 NOTE — Assessment & Plan Note (Signed)
Jean [name] states that she is lost weight over the last 1 year due to a lack of appetite.  She feels her limited use of her dentures may be contributing to this.  She denies any other symptoms at this time including night sweats, fever, chills.  Assessment/plan: Per chart review, approximately 8 pound weight loss over the last 1 year.  No indication at this time for a extensive evaluation but will start with age-appropriate cancer screening  - Mammogram and colonoscopy ordered

## 2021-05-09 NOTE — Telephone Encounter (Signed)
Jon Billings from [facility] received a referral for SW for patient.  Patient's insurance does not cover this service.  Will however do the PT for patient.  Patient wants to try and get PCS .  Will refer to Clinic SW to see if this can be facilitated.

## 2021-05-11 ENCOUNTER — Ambulatory Visit (INDEPENDENT_AMBULATORY_CARE_PROVIDER_SITE_OTHER): Payer: Medicaid Other | Admitting: Orthopaedic Surgery

## 2021-05-11 ENCOUNTER — Other Ambulatory Visit: Payer: Self-pay

## 2021-05-11 ENCOUNTER — Encounter: Payer: Self-pay | Admitting: Orthopaedic Surgery

## 2021-05-11 DIAGNOSIS — M4317 Spondylolisthesis, lumbosacral region: Secondary | ICD-10-CM

## 2021-05-11 DIAGNOSIS — M659 Synovitis and tenosynovitis, unspecified: Secondary | ICD-10-CM

## 2021-05-11 DIAGNOSIS — G8929 Other chronic pain: Secondary | ICD-10-CM

## 2021-05-11 DIAGNOSIS — M25562 Pain in left knee: Secondary | ICD-10-CM | POA: Diagnosis not present

## 2021-05-11 DIAGNOSIS — M5441 Lumbago with sciatica, right side: Secondary | ICD-10-CM | POA: Diagnosis not present

## 2021-05-11 MED ORDER — LIDOCAINE HCL 1 % IJ SOLN
3.0000 mL | INTRAMUSCULAR | Status: AC | PRN
  Administered 2021-05-11: 3 mL

## 2021-05-11 MED ORDER — METHYLPREDNISOLONE ACETATE 40 MG/ML IJ SUSP
40.0000 mg | INTRAMUSCULAR | Status: AC | PRN
  Administered 2021-05-11: 40 mg via INTRA_ARTICULAR

## 2021-05-11 NOTE — Progress Notes (Signed)
Office Visit Note   Patient: Jean AMBROCIO           Date of Birth: 04-30-1966           MRN: [id number] Visit Date: 05/11/2021              Requested by: Jose Persia, MD [address] [place],  [place] [address] PCP: Jose Persia, MD   Assessment & Plan: Visit Diagnoses:  1. Synovitis of left knee   2. Chronic pain of left knee   3. Spondylolisthesis of lumbosacral region   4. Acute right-sided low back pain with right-sided sciatica     Plan:   I did recommend a steroid injection in her left knee today and she agreed this and tolerated well.  I will send her to Dr. Ernestina Patches for right-sided facet joint injections at L4-L5 and likely L5-S1.  From a cervical spine standpoint, she is already had 3 injections in the past and likely needs to see Dr. Lorin Mercy again for her cervical spine.  I will see her back myself for her left knee in 4 weeks and to see how she is done with her facet joint injections in her lumbar spine.  All question concerns were answered and addressed.  Follow-Up Instructions: Return in about 6 weeks (around 06/22/2021).   Orders:  Orders Placed This Encounter  Procedures   Large Joint Inj   No orders of the defined types were placed in this encounter.     Procedures: Large Joint Inj: R knee on 05/11/2021 3:46 PM Indications: diagnostic evaluation and pain Details: 22 G 1.5 in needle, superolateral approach  Arthrogram: No  Medications: 3 mL lidocaine 1 %; 40 mg methylPREDNISolone acetate 40 MG/ML Outcome: tolerated well, no immediate complications Procedure, treatment alternatives, risks and benefits explained, specific risks discussed. Consent was given by the patient. Immediately prior to procedure a time out was called to verify the correct patient, procedure, equipment, support staff and site/side marked as required. Patient was prepped and draped in the usual sterile fashion.      Clinical Data: No additional  findings.   Subjective: Chief Complaint  Patient presents with   Left Knee - Pain   Right Hip - Pain  The patient comes in today for evaluation and treatment of right hip pain.  I have actually replaced her left hip before.  She last saw Dr. Lorin Mercy back in June of this past year and he aspirated her left knee.  She states that she is having left knee pain again but she really hurts in her right hip but she points to the lower aspect of her lumbar spine as a source of her pain on the right side.  She denies any right hip groin pain.  She is also been dealing with some chronic neck pain and has had at least 3 injections she states in her cervical spine by Dr. Ernestina Patches.  She does ambulate with a rolling walker.  She is a very thin 56 year old female.  She denies any recent illnesses.  She states that her left hip is asymptomatic from a hip replacement in 2017.  HPI  Review of Systems There is currently listed no headache, chest pain, shortness of breath, fever, chills, nausea, vomiting  Objective: Vital Signs: LMP  (LMP Unknown)   Physical Exam She is alert and orient x3 and in no acute distress Ortho Exam Examination of her left operative hip shows it moves smoothly and fluidly.  Her  right hip also moves smoothly and fluidly.  Her pain seems to be facet joint mediated and low back to the right side but not a sciatic component anteriorly.  Her left knee does have global tenderness and pain but no effusion today.  She has good range of motion of her left knee.  X-rays from earlier this year of her pelvis and right hip showed normal-appearing hip.  The previous MRI of her lumbar spine last year showed significant facet arthritic changes at L4-L5 and L5-S1. Specialty Comments:  No specialty comments available.  Imaging: No results found.   PMFS History: Patient Active Problem List   Diagnosis Date Noted   Constipation 05/09/2021   Weight loss 05/09/2021   Headache    Symptomatic bradycardia  04/18/2021   Near syncope 04/18/2021   Synovitis of left knee 07/26/2020   COVID-19 virus infection 05/11/2020   Low back pain 11/24/2019   Impingement syndrome of right shoulder 04/22/2019   History of dental surgery 02/05/2019   COPD GOLD 0 ? 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   GERD (gastroesophageal reflux disease)    Breast pain 06/23/2018   Dyspareunia in female 09/13/2017   Cervicalgia 04/24/2017   Chronic kidney disease, stage 3a ([place]) 10/29/2016   Chronic pain syndrome 05/10/2016   MDD (major depressive disorder) 10/19/2015   Dyspnea on exertion 08/22/2015   HTN (hypertension) 07/08/2015   Routine health maintenance 07/08/2015   Past Medical History:  Diagnosis Date   Adnexal mass 2019   likely remnant fibroid on broad ligament; getting diagnostic lap   Anxiety    Arthritis    hip   Asthma    Carpal tunnel syndrome    bilateral   Chest wall pain 10/15/2016   Chronic lower back pain    Depression    Dyspnea    with exertion   GERD (gastroesophageal reflux disease)    Headache    migraines   Herpes    Hypertension    Liver lesion, right lobe 12/01/2015   CT chest without contrast 11/30/15 with hepatic lesions measuring 2.0 x 1.7 cm of the right lobe and 1 x 1 cm on the dome. CT abd/pel 8/18: 1.3cm inferior R liver lobe, prob benign but recommend 3-39mo f/u with MRI w/wo IV contrast with attenuation MRI 6/19: combination of benign cavernous hemangiomas and small benign cysts   Poor dental hygiene    chipped upper front and several loose teeth   Substance abuse ([place])    cocaine/crack cocaine - last use 02/07/18   SVD (spontaneous vaginal delivery)    x 1   Tobacco abuse 07/08/2015   Uses walker    for ambulation   Uterine leiomyoma 02/16/2016   s/p vaginal hysterectomy 2018   Vaginal discharge 03/26/2018    Family History  Problem Relation Age of Onset   Cancer Mother    Hypertension Mother    Diabetes Mother     Breast cancer Mother        diagnosed in her 66's   Stroke Father    Breast cancer Cousin     Past Surgical History:  Procedure Laterality Date   LAPAROSCOPIC OVARIAN CYSTECTOMY Right 02/25/2018   Procedure: LAPAROSCOPIC REMOVAL OF RIGHT ADNEXAL MASS;  Surgeon: Chancy Milroy, MD;  Location: [facility];  Service: Gynecology;  Laterality: Right;   SALPINGECTOMY Left    lap - ectopic preg   TOTAL HIP ARTHROPLASTY Left 01/20/2016  Procedure: LEFT TOTAL HIP ARTHROPLASTY ANTERIOR APPROACH;  Surgeon: Mcarthur Rossetti, MD;  Location: [facility];  Service: Orthopedics;  Laterality: Left;   TUBAL LIGATION     VAGINAL HYSTERECTOMY Right 12/11/2016   Procedure: HYSTERECTOMY VAGINAL WITH RIGHT SALPINGECTOMY;  Surgeon: Chancy Milroy, MD;  Location: [facility];  Service: Gynecology;  Laterality: Right;   Social History   Occupational History   Not on file  Tobacco Use   Smoking status: Former    Packs/day: 0.50    Years: 29.00    Pack years: 14.50    Types: Cigarettes    Quit date: 11/25/2018    Years since quitting: 2.4   Smokeless tobacco: Never  Vaping Use   Vaping Use: Former  Substance and Sexual Activity   Alcohol use: Yes    Alcohol/week: 3.0 standard drinks    Types: 3 Cans of beer per week    Comment: Occasional Beer    Drug use: No    Types: "Crack" cocaine, Cocaine    Comment: last 6 months ago.   Sexual activity: Yes    Partners: Male    Birth control/protection: None, Post-menopausal    Comment: Hysterectomy

## 2021-05-30 ENCOUNTER — Inpatient Hospital Stay: Admission: RE | Admit: 2021-05-30 | Payer: Medicaid Other | Source: Ambulatory Visit

## 2021-06-01 ENCOUNTER — Ambulatory Visit: Payer: Medicaid Other | Admitting: Physical Medicine and Rehabilitation

## 2021-06-15 ENCOUNTER — Other Ambulatory Visit: Payer: Self-pay | Admitting: Student

## 2021-06-15 DIAGNOSIS — M25559 Pain in unspecified hip: Secondary | ICD-10-CM

## 2021-06-16 ENCOUNTER — Other Ambulatory Visit: Payer: Self-pay | Admitting: *Deleted

## 2021-06-16 DIAGNOSIS — J449 Chronic obstructive pulmonary disease, unspecified: Secondary | ICD-10-CM

## 2021-06-16 MED ORDER — ALBUTEROL SULFATE HFA 108 (90 BASE) MCG/ACT IN AERS: 2.0000 | INHALATION_SPRAY | Freq: Four times a day (QID) | RESPIRATORY_TRACT | 0 refills | Status: DC | PRN

## 2021-06-16 MED ORDER — PANTOPRAZOLE SODIUM 40 MG PO TBEC: 40.0000 mg | DELAYED_RELEASE_TABLET | Freq: Every day | ORAL | 1 refills | Status: DC

## 2021-06-16 NOTE — Telephone Encounter (Signed)
Patient called in requesting refills on all her meds. Reviewed med list with her and many have refills at Pharmacy. Will send requests for remaining meds.

## 2021-06-19 ENCOUNTER — Ambulatory Visit: Payer: Self-pay

## 2021-06-19 ENCOUNTER — Ambulatory Visit (INDEPENDENT_AMBULATORY_CARE_PROVIDER_SITE_OTHER): Payer: Medicaid Other | Admitting: Physical Medicine and Rehabilitation

## 2021-06-19 ENCOUNTER — Encounter: Payer: Self-pay | Admitting: Physical Medicine and Rehabilitation

## 2021-06-19 ENCOUNTER — Other Ambulatory Visit: Payer: Self-pay

## 2021-06-19 VITALS — BP 147/94 | HR 74

## 2021-06-19 DIAGNOSIS — M47816 Spondylosis without myelopathy or radiculopathy, lumbar region: Secondary | ICD-10-CM

## 2021-06-19 MED ORDER — METHYLPREDNISOLONE ACETATE 80 MG/ML IJ SUSP
80.0000 mg | Freq: Once | INTRAMUSCULAR | Status: AC
  Administered 2021-06-19: 80 mg

## 2021-06-19 NOTE — Patient Instructions (Signed)

## 2021-06-19 NOTE — Progress Notes (Signed)
Pt state lower back pain that travels to her right hip. Pt state standing makes the pain worse. Pt state she takes pain meds and pain cream to help ease her pain.  Numeric Pain Rating Scale and Functional Assessment Average Pain 8   In the last MONTH (on 0-10 scale) has pain interfered with the following?  1. General activity like being  able to carry out your everyday physical activities such as walking, climbing stairs, carrying groceries, or moving a chair?  Rating(10)   +Driver, -BT, -Dye Allergies.

## 2021-06-21 ENCOUNTER — Encounter: Payer: Medicaid Other | Admitting: Student

## 2021-06-22 ENCOUNTER — Ambulatory Visit: Payer: Medicaid Other | Admitting: Orthopaedic Surgery

## 2021-06-24 NOTE — Procedures (Signed)
Lumbar Facet Joint Intra-Articular Injection(s) with Fluoroscopic Guidance  Patient: Jean [name]      Date of Birth: [birth year] MRN: [id number] PCP: Jose Persia, MD      Visit Date: 06/19/2021   Universal Protocol:    Date/Time: 06/19/2021  Consent Given By: the patient  Position: PRONE   Additional Comments: Vital signs were monitored before and after the procedure. Patient was prepped and draped in the usual sterile fashion. The correct patient, procedure, and site was verified.   Injection Procedure Details:  Procedure Site One Meds Administered:  Meds ordered this encounter  Medications   methylPREDNISolone acetate (DEPO-MEDROL) injection 80 mg     Laterality: Right  Location/Site:  L4-L5 L5-S1  Needle size: 22 guage  Needle type: Spinal  Needle Placement: Articular  Findings:  -Comments: Excellent flow of contrast producing a partial arthrogram.  Procedure Details: The fluoroscope beam is vertically oriented in AP, and the inferior recess is visualized beneath the lower pole of the inferior apophyseal process, which represents the target point for needle insertion. When direct visualization is difficult the target point is located at the medial projection of the vertebral pedicle. The region overlying each aforementioned target is locally anesthetized with a 1 to 2 ml. volume of 1% Lidocaine without Epinephrine.   The spinal needle was inserted into each of the above mentioned facet joints using biplanar fluoroscopic guidance. A 0.25 to 0.5 ml. volume of Isovue-250 was injected and a partial facet joint arthrogram was obtained. A single spot film was obtained of the resulting arthrogram.    One to 1.25 ml of the steroid/anesthetic solution was then injected into each of the facet joints noted above.   Additional Comments:  The patient tolerated the procedure well Dressing: 2 x 2 sterile gauze and Band-Aid    Post-procedure details: Patient was  observed during the procedure. Post-procedure instructions were reviewed.  Patient left the clinic in stable condition.

## 2021-06-24 NOTE — Progress Notes (Signed)
Jean [name] - 56 y.o. female MRN [id number]  Date of birth: 1966-01-19  Office Visit Note: Visit Date: 06/19/2021 PCP: Jose Persia, MD Referred by: Jose Persia, MD  Subjective: Chief Complaint  Patient presents with   Lower Back - Pain   Right Hip - Pain   HPI:  Jean [name] is a 56 y.o. female who comes in today at the request of Dr. Jean Rosenthal for planned Right  L4-5 and L5-S1 Lumbar facet/medial branch block with fluoroscopic guidance.  The patient has failed conservative care including home exercise, medications, time and activity modification.  This injection will be diagnostic and hopefully therapeutic.  Please see requesting physician notes for further details and justification.  Exam has shown concordant pain with facet joint loading.  ROS Otherwise per HPI.  Assessment & Plan: Visit Diagnoses:    ICD-10-CM   1. Spondylosis without myelopathy or radiculopathy, lumbar region  M47.816 XR C-ARM NO REPORT    Facet Injection    methylPREDNISolone acetate (DEPO-MEDROL) injection 80 mg      Plan: No additional findings.   Meds & Orders:  Meds ordered this encounter  Medications   methylPREDNISolone acetate (DEPO-MEDROL) injection 80 mg    Orders Placed This Encounter  Procedures   Facet Injection   XR C-ARM NO REPORT    Follow-up: Return if symptoms worsen or fail to improve.   Procedures: No procedures performed  Lumbar Facet Joint Intra-Articular Injection(s) with Fluoroscopic Guidance  Patient: Jean [name]      Date of Birth: [birth year] MRN: [id number] PCP: Jose Persia, MD      Visit Date: 06/19/2021   Universal Protocol:    Date/Time: 06/19/2021  Consent Given By: the patient  Position: PRONE   Additional Comments: Vital signs were monitored before and after the procedure. Patient was prepped and draped in the usual sterile fashion. The correct patient, procedure, and site was verified.   Injection Procedure  Details:  Procedure Site One Meds Administered:  Meds ordered this encounter  Medications   methylPREDNISolone acetate (DEPO-MEDROL) injection 80 mg     Laterality: Right  Location/Site:  L4-L5 L5-S1  Needle size: 22 guage  Needle type: Spinal  Needle Placement: Articular  Findings:  -Comments: Excellent flow of contrast producing a partial arthrogram.  Procedure Details: The fluoroscope beam is vertically oriented in AP, and the inferior recess is visualized beneath the lower pole of the inferior apophyseal process, which represents the target point for needle insertion. When direct visualization is difficult the target point is located at the medial projection of the vertebral pedicle. The region overlying each aforementioned target is locally anesthetized with a 1 to 2 ml. volume of 1% Lidocaine without Epinephrine.   The spinal needle was inserted into each of the above mentioned facet joints using biplanar fluoroscopic guidance. A 0.25 to 0.5 ml. volume of Isovue-250 was injected and a partial facet joint arthrogram was obtained. A single spot film was obtained of the resulting arthrogram.    One to 1.25 ml of the steroid/anesthetic solution was then injected into each of the facet joints noted above.   Additional Comments:  The patient tolerated the procedure well Dressing: 2 x 2 sterile gauze and Band-Aid    Post-procedure details: Patient was observed during the procedure. Post-procedure instructions were reviewed.  Patient left the clinic in stable condition.     Clinical History: MRI LUMBAR SPINE WITHOUT CONTRAST   TECHNIQUE: Multiplanar, multisequence MR imaging of the lumbar spine was  performed. No intravenous contrast was administered.   COMPARISON:  Lumbar spine radiographs 09/10/2019   FINDINGS: The axial sequences are mildly to moderately motion degraded.   Segmentation: Transitional lumbosacral anatomy with partial lumbarization of S1 and a  rudimentary S1-2 disc.   Alignment:  3 mm anterolisthesis of L5 on S1.   Vertebrae: No fracture, suspicious osseous lesion, or evidence of discitis. Moderate bilateral facet edema at L4-5.   Conus medullaris and cauda equina: Conus extends to the upper L2 level. Conus and cauda equina appear normal.   Paraspinal and other soft tissues: Small bilateral ovarian cysts measuring up to 1.3 cm.   Disc levels:   L1-2: Negative.   L2-3: Normal disc.  Mild right facet hypertrophy without stenosis.   L3-4: Mild disc bulging and mild facet and ligamentum flavum hypertrophy without stenosis.   L4-5: Mild disc bulging, congenitally short pedicles, and moderate facet and ligamentum flavum hypertrophy result in mild spinal stenosis without neural foraminal stenosis.   L5-S1: Minimal anterolisthesis with disc uncovering and severe facet hypertrophy without stenosis.   IMPRESSION: 1. Transitional lumbosacral anatomy. 2. Advanced lower lumbar facet arthrosis with minimal anterolisthesis at L5-S1 and facet edema at L4-5. 3. Mild multifactorial spinal stenosis at L4-5.     Electronically Signed   By: Logan Bores M.D.   On: 10/24/2019 22:25     Objective:  VS:  HT:     WT:    BMI:      BP:(!) 147/94   HR:74bpm   TEMP: ( )   RESP:  Physical Exam Vitals and nursing note reviewed.  Constitutional:      General: She is not in acute distress.    Appearance: Normal appearance. She is not ill-appearing.  HENT:     Head: Normocephalic and atraumatic.     Right Ear: External ear normal.     Left Ear: External ear normal.  Eyes:     Extraocular Movements: Extraocular movements intact.  Cardiovascular:     Rate and Rhythm: Normal rate.     Pulses: Normal pulses.  Pulmonary:     Effort: Pulmonary effort is normal. No respiratory distress.  Abdominal:     General: There is no distension.     Palpations: Abdomen is soft.  Musculoskeletal:        General: Tenderness present.     Cervical  back: Neck supple.     Right lower leg: No edema.     Left lower leg: No edema.     Comments: Patient has good distal strength with no pain over the greater trochanters.  No clonus or focal weakness. Patient somewhat slow to rise from a seated position to full extension.  There is concordant low back pain with facet loading and lumbar spine extension rotation.  There are no definitive trigger points but the patient is somewhat tender across the lower back and PSIS.  There is no pain with hip rotation.   Skin:    Findings: No erythema, lesion or rash.  Neurological:     General: No focal deficit present.     Mental Status: She is alert and oriented to person, place, and time.     Sensory: No sensory deficit.     Motor: No weakness or abnormal muscle tone.     Coordination: Coordination normal.  Psychiatric:        Mood and Affect: Mood normal.        Behavior: Behavior normal.     Imaging: No results  found.

## 2021-06-27 ENCOUNTER — Encounter: Payer: Medicaid Other | Admitting: Student

## 2021-07-03 ENCOUNTER — Telehealth: Payer: Self-pay | Admitting: *Deleted

## 2021-07-03 NOTE — Telephone Encounter (Signed)
Incoming call from pt c/o irregular heart beat, difficulty swallowing. I asked her when this started , she stated "it came back just now". I asked when did it first start; she stated "it has been a minute". While talking to her, she sound short of breath; she agreed. And she sound anxious of what's going on.I instructed pt to call 911 or go to the ED - she stated you will.

## 2021-07-04 ENCOUNTER — Ambulatory Visit: Payer: Medicaid Other | Admitting: Orthopaedic Surgery

## 2021-07-05 ENCOUNTER — Telehealth: Payer: Self-pay | Admitting: Internal Medicine

## 2021-07-05 NOTE — Telephone Encounter (Signed)
.. ?  Medicaid Managed Care  ? ?Unsuccessful Outreach Note ? ?07/05/2021 ?Name: ARTAVIA JEANLOUIS MRN: [id number] DOB: Jul 08, 1965 ? ?Referred by: Jose Persia, MD ?Reason for referral : High Risk Managed Medicaid (Called the patient today to get her rescheduled with the [facility] RNCM. I left my name and number on her VM.) ? ? ?An unsuccessful telephone outreach was attempted today. The patient was referred to the case management team for assistance with care management and care coordination.  ? ?Follow Up Plan: The care management team will reach out to the patient again over the next 7 days.  ? ? ?Reita Chard ?Care Guide, High Risk Medicaid Managed Care ?Embedded Care Coordination ?[place]  ? ? ? ?

## 2021-07-12 ENCOUNTER — Encounter: Payer: Self-pay | Admitting: Orthopaedic Surgery

## 2021-07-12 ENCOUNTER — Other Ambulatory Visit: Payer: Self-pay

## 2021-07-12 ENCOUNTER — Ambulatory Visit (INDEPENDENT_AMBULATORY_CARE_PROVIDER_SITE_OTHER): Payer: Medicaid Other | Admitting: Orthopaedic Surgery

## 2021-07-12 DIAGNOSIS — M25562 Pain in left knee: Secondary | ICD-10-CM

## 2021-07-12 DIAGNOSIS — G8929 Other chronic pain: Secondary | ICD-10-CM

## 2021-07-12 DIAGNOSIS — M4317 Spondylolisthesis, lumbosacral region: Secondary | ICD-10-CM

## 2021-07-12 NOTE — Progress Notes (Signed)
The patient is a 56 year old without seen recently for synovitis and pain in her left knee.  I did replace her left hip remotely.  She does ambulate with a rolling walker.  She is seeing Dr. Lorin Mercy in the past for her neck and her back.  Recently we sent her to Dr. Ernestina Patches who performed right-sided L4-L5 and L5-S1 injections in her back.  She states that those really did not help her whole lot.  However she does not appear uncomfortable.  She says her left knee is still painful.  A MRI of her left knee was ordered by Dr. Lorin Mercy last May.  It showed some arthritic changes in the knee but no significant tear.  There was a lesion in the left proximal fibula.  It appears to be a chondroid type of lesion.  She is still asymptomatic over that area. ? ?On exam today the left knee hurts but there is no effusion.  She has good range of motion of it as well. ? ?At this point I would certainly recommend trying hyaluronic acid for her left knee to treat the pain from her moderate arthritis in that knee.  We will see if we can get this ordered for her.  I have really no other recommendations other than considering physical therapy for her back and her knee. ?

## 2021-07-13 ENCOUNTER — Telehealth: Payer: Self-pay

## 2021-07-13 NOTE — Telephone Encounter (Signed)
Noted  

## 2021-07-13 NOTE — Telephone Encounter (Signed)
Left knee gel injection ?

## 2021-07-21 ENCOUNTER — Telehealth: Payer: Self-pay

## 2021-07-21 NOTE — Telephone Encounter (Signed)
Mailed [employer] application to patient's address listed for Monovisc, left knee. 

## 2021-08-01 ENCOUNTER — Ambulatory Visit (AMBULATORY_SURGERY_CENTER): Payer: Medicaid Other

## 2021-08-01 ENCOUNTER — Other Ambulatory Visit: Payer: Self-pay

## 2021-08-01 VITALS — Ht 64.0 in | Wt 145.0 lb

## 2021-08-01 DIAGNOSIS — Z1211 Encounter for screening for malignant neoplasm of colon: Secondary | ICD-10-CM

## 2021-08-01 MED ORDER — POLYETHYLENE GLYCOL 3350 17 GM/SCOOP PO POWD: ORAL | 0 refills | Status: DC

## 2021-08-01 MED ORDER — NA SULFATE-K SULFATE-MG SULF 17.5-3.13-1.6 GM/177ML PO SOLN: 1.0000 | Freq: Once | ORAL | 0 refills | Status: AC

## 2021-08-01 MED ORDER — BISACODYL 5 MG PO TBEC: 5.0000 mg | DELAYED_RELEASE_TABLET | Freq: Once | ORAL | 0 refills | Status: AC

## 2021-08-01 NOTE — Progress Notes (Signed)
No egg or soy allergy known to patient  ?No issues known to pt with past sedation with any surgeries or procedures ?Patient denies ever being told they had issues or difficulty with intubation  ?No FH of Malignant Hyperthermia ?Pt is not on diet pills ?Pt is not on home 02  ?Pt is not on blood thinners  ?Pt denies issues with constipation during pre visit; ?No A fib or A flutter ?NO PA's for preps discussed with pt in PV today  ?Discussed with pt there will be an out-of-pocket cost for prep and that varies from $0 to 70 + dollars - pt verbalized understanding  ?Due to the COVID-19 pandemic we are asking patients to follow certain guidelines in PV and the [place]   ?Pt aware of COVID protocols and [facility] guidelines  ?PV completed over the phone. Pt verified name, DOB, address and insurance during PV today.  ?Pt mailed instruction packet with copy of consent form to read and not return, and instructions.  ?Pt encouraged to call with questions or issues.  ?If pt has My chart, procedure instructions sent via My Chart  ? ?Miralax and Dulcolax Rx sent in with Suprep Rx; ?

## 2021-08-07 ENCOUNTER — Other Ambulatory Visit: Payer: Self-pay | Admitting: Gastroenterology

## 2021-08-07 ENCOUNTER — Ambulatory Visit: Payer: Medicaid Other | Admitting: Gastroenterology

## 2021-08-07 ENCOUNTER — Telehealth: Payer: Self-pay

## 2021-08-07 ENCOUNTER — Encounter: Payer: Self-pay | Admitting: Certified Registered"

## 2021-08-07 VITALS — BP 177/106 | HR 61 | Temp 97.1°F | Ht 64.0 in | Wt 145.0 lb

## 2021-08-07 DIAGNOSIS — Z1211 Encounter for screening for malignant neoplasm of colon: Secondary | ICD-10-CM

## 2021-08-07 MED ORDER — SODIUM CHLORIDE 0.9 % IV SOLN: 500.0000 mL | Freq: Once | INTRAVENOUS | Status: DC

## 2021-08-07 MED ORDER — NA SULFATE-K SULFATE-MG SULF 17.5-3.13-1.6 GM/177ML PO SOLN: 1.0000 | Freq: Once | ORAL | 0 refills | Status: AC

## 2021-08-07 NOTE — Telephone Encounter (Signed)
Patient arrived prepped today but appointment was for 5/3.  [facility] tried to accomodate but during admission process patient revealed she had drank at 11 and ate yesterday.  Rx sent to pharmacy and 5/3 appointment put back on schedule. ?

## 2021-08-07 NOTE — Progress Notes (Signed)
HPI: ?This is a woman at routine risk for CRC ? ?Showed up a month early for this screening colonoscoyp ? ? ?ROS: complete GI ROS as described in HPI, all other review negative. ? ?Constitutional:  No unintentional weight loss ? ? ?Past Medical History:  ?Diagnosis Date  ? Adnexal mass 2019  ? likely remnant fibroid on broad ligament; getting diagnostic lap  ? Anxiety   ? on meds  ? Arthritis   ? hip-uses OTC meds  ? Asthma   ? uses inhaler as needed  ? Carpal tunnel syndrome   ? bilateral  ? Chest wall pain 10/15/2016  ? Chronic lower back pain   ? COPD (chronic obstructive pulmonary disease) ([place])   ? uses in haler as needed  ? Depression   ? on meds  ? Dyspnea   ? with exertion  ? GERD (gastroesophageal reflux disease)   ? Headache   ? migraines  ? Herpes   ? Hypertension   ? on meds  ? Liver lesion, right lobe 12/01/2015  ? CT chest without contrast 11/30/15 with hepatic lesions measuring 2.0 x 1.7 cm of the right lobe and 1 x 1 cm on the dome. CT abd/pel 8/18: 1.3cm inferior R liver lobe, prob benign but recommend 3-44mof/u with MRI w/wo IV contrast with attenuation MRI 6/19: combination of benign cavernous hemangiomas and small benign cysts  ? Poor dental hygiene   ? chipped upper front and several loose teeth  ? Seasonal allergies   ? [place]   ? Sub[place] ([place]   ? cocaine/crack cocaine - last use 02/07/18  ? SVD (spontaneous vaginal delivery)   ? x 1  ? Tobacco abuse 07/08/2015  ? Uses walker   ? for ambulation  ? Uterine leiomyoma 02/16/2016  ? s/p vaginal hysterectomy 2018  ? Vaginal discharge 03/26/2018  ? ? ?Past Surgical History:  ?Procedure Laterality Date  ? LAPAROSCOPIC OVARIAN CYSTECTOMY Right 02/25/2018  ? Procedure: LAPAROSCOPIC REMOVAL OF RIGHT ADNEXAL MASS;  Surgeon: EChancy Milroy MD;  [facility];  Service: Gynecology;  Laterality: Right;  ? SALPINGECTOMY Left   ? lap - ectopic preg  ? TOTAL HIP ARTHROPLASTY Left 01/20/2016  ? Procedure: LEFT TOTAL HIP ARTHROPLASTY  ANTERIOR APPROACH;  Surgeon: CMcarthur Rossetti MD;  Location: [facility];  Service: Orthopedics;  Laterality: Left;  ? TUBAL LIGATION    ? VAGINAL HYSTERECTOMY Right 12/11/2016  ? Procedure: HYSTERECTOMY VAGINAL WITH RIGHT SALPINGECTOMY;  Surgeon: EChancy Milroy MD;  [facility];  Service: Gynecology;  Laterality: Right;  ? ? ?Current Outpatient Medications  ?Medication Sig Dispense Refill  ? albuterol (PROAIR HFA) 108 (90 Base) MCG/ACT inhaler Inhale 2 puffs into the lungs every 6 (six) hours as needed for wheezing or shortness of breath (coughing). 6.7 g 0  ? amLODipine (NORVASC) 10 MG tablet Take 1 tablet (10 mg total) by mouth daily. 90 tablet 3  ? aspirin 81 MG chewable tablet Chew 81 mg by mouth daily.    ? cyclobenzaprine (FLEXERIL) 10 MG tablet TAKE 1 TABLET BY MOUTH TWICE A DAY AS NEEDED FOR MUSCLE SPASMS 30 tablet 3  ? gabapentin (NEURONTIN) 300 MG capsule TAKE 1 CAPSULE BY MOUTH EVERYDAY AT BEDTIME (Patient taking differently: Take 300 mg by mouth at bedtime.) 90 capsule 1  ? pantoprazole (PROTONIX) 40 MG tablet Take 1 tablet (40 mg total) by mouth daily. 90 tablet 1  ? polyethylene glycol powder (GLYCOLAX/MIRALAX) 17 GM/SCOOP powder See prep  instructions for use 119 g 0  ? sertraline (ZOLOFT) 100 MG tablet TAKE 1 TABLET BY MOUTH EVERY DAY (Patient taking differently: Take 100 mg by mouth daily.) 90 tablet 1  ? tetrahydrozoline 0.05 % ophthalmic solution Place 1 drop into both eyes daily as needed (for dry eyes).     ? ?No current facility-administered medications for this visit.  ? ? ?Allergies as of 08/07/2021 - Review Complete 08/07/2021  ?Allergen Reaction Noted  ? Lisinopril Swelling and Other (See Comments) 06/14/2017  ? ? ?Family History  ?Problem Relation Age of Onset  ? Cancer Mother   ? Hypertension Mother   ? Diabetes Mother   ? Breast cancer Mother   ?     diagnosed in her 61's  ? Stroke Father   ? Breast cancer Cousin   ? Colon cancer Neg Hx   ? Colon polyps Neg Hx   ? Esophageal  cancer Neg Hx   ? Rectal cancer Neg Hx   ? Stomach cancer Neg Hx   ? ? ?Social History  ? ?Socioeconomic History  ? Marital status: Single  ?  Spouse name: Not on file  ? Number of children: 1  ? Years of education: Not on file  ? Highest education level: Not on file  ?Occupational History  ? Not on file  ?Tobacco Use  ? Smoking status: Former  ?  Packs/day: 0.50  ?  Years: 29.00  ?  Pack years: 14.50  ?  Types: Cigarettes  ?  Quit date: 11/25/2018  ?  Years since quitting: 2.7  ? Smokeless tobacco: Never  ?Vaping Use  ? Vaping Use: Former  ?Substance and Sexual Activity  ? Alcohol use: Yes  ?  Alcohol/week: 3.0 standard drinks  ?  Types: 3 Cans of beer per week  ?  Comment: Occasional Beer   ? Drug use: No  ?  Types: "Crack" cocaine, Cocaine  ?  Comment: last 6 months ago.  ? Sexual activity: Yes  ?  Partners: Male  ?  Birth control/protection: None, Post-menopausal  ?  Comment: Hysterectomy  ?Other Topics Concern  ? Not on file  ?Social History Narrative  ? Lives with Esmeralda Arthur  ? Caffeine use: soda sometimes.  ? Right handed   ? ?Social Determinants of Health  ? ?Financial Resource Strain: Not on file  ?Food Insecurity: Not on file  ?Transportation Needs: Not on file  ?Physical Activity: Not on file  ?Stress: Not on file  ?Social Connections: Not on file  ?Intimate Partner Violence: Not on file  ? ? ? ?Physical Exam: ? ?Constitutional: generally well-appearing ?Psychiatric: alert and oriented x3 ?Lungs: CTA bilaterally ?Heart: no MCR ? ?Assessment and plan: ?55 y.o. female with routine risk for CRC ? ?Screening colonoscoyp today ? ?Care is appropriate for the ambulatory setting. ? ?Owens Loffler, MD ?[facility] ?08/07/2021, 11:21 AM ? ? ? ?

## 2021-09-06 ENCOUNTER — Telehealth: Payer: Self-pay | Admitting: Gastroenterology

## 2021-09-06 ENCOUNTER — Encounter: Payer: Medicaid Other | Admitting: Gastroenterology

## 2021-09-06 NOTE — Telephone Encounter (Signed)
Left message for patient advising we do have an opening tomorrow afternoon potentially if she can arrange transportation  ( as she is already prepped).  Requested that patient return our call. ?

## 2021-09-06 NOTE — Telephone Encounter (Signed)
Hey Dr. Tarri Glenn,  ? ?Inbound call from patient. Called to cancel procedure this morning due to no transportation. Patient will call back to reschedule. ? ?Thank you ?

## 2021-09-10 ENCOUNTER — Other Ambulatory Visit: Payer: Self-pay | Admitting: Student

## 2021-09-10 DIAGNOSIS — I10 Essential (primary) hypertension: Secondary | ICD-10-CM

## 2021-09-21 ENCOUNTER — Encounter: Payer: Medicaid Other | Admitting: Student

## 2021-09-21 ENCOUNTER — Telehealth: Payer: Self-pay | Admitting: *Deleted

## 2021-09-21 NOTE — Telephone Encounter (Signed)
Called patient regarding her missed appointment/ lvm for patient to call the main number at [phone] to reschedule appointment.

## 2021-09-22 ENCOUNTER — Encounter: Payer: Self-pay | Admitting: Internal Medicine

## 2021-10-03 ENCOUNTER — Ambulatory Visit: Payer: Medicaid Other | Admitting: Surgical

## 2021-10-08 ENCOUNTER — Other Ambulatory Visit: Payer: Self-pay | Admitting: Internal Medicine

## 2021-10-08 DIAGNOSIS — J449 Chronic obstructive pulmonary disease, unspecified: Secondary | ICD-10-CM

## 2021-11-15 ENCOUNTER — Ambulatory Visit: Payer: Medicaid Other | Admitting: Orthopaedic Surgery

## 2021-11-20 ENCOUNTER — Ambulatory Visit: Payer: Medicaid Other | Admitting: Orthopaedic Surgery

## 2021-12-06 ENCOUNTER — Other Ambulatory Visit: Payer: Self-pay | Admitting: Internal Medicine

## 2021-12-07 ENCOUNTER — Ambulatory Visit (INDEPENDENT_AMBULATORY_CARE_PROVIDER_SITE_OTHER): Payer: Medicaid Other | Admitting: Orthopaedic Surgery

## 2021-12-07 ENCOUNTER — Ambulatory Visit (INDEPENDENT_AMBULATORY_CARE_PROVIDER_SITE_OTHER): Payer: Medicaid Other

## 2021-12-07 DIAGNOSIS — M79601 Pain in right arm: Secondary | ICD-10-CM

## 2021-12-07 DIAGNOSIS — M25551 Pain in right hip: Secondary | ICD-10-CM

## 2021-12-07 NOTE — Progress Notes (Signed)
The patient is a 56 year old female that I have seen before and replaced her left hip.  She reports significant weight loss over time and she is using a rolling walker.  She has been having some right hip pain but really most of her pain is in the right side of her lumbar spine.  She also has right-sided neck pain with numbness and tingling going down into her right hand.  These of all been worked up in the past by one of my partners and she has had MRIs of her lumbar spine and her cervical spine.  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  She also had MRI of her lumbar spine that showed some mild degenerative change at the lower lumbar spine to the right side.  On exam she does have a positive Spurling sign to the right side.  She has slightly weak grip strength on the right side with numbness and tingling on the lateral aspect of her hand on the right side.  She also has right-sided low back pain.  Her right hip moves smoothly and fluidly.  X-rays of the pelvis and right hip show a well-seated total hip arthroplasty on the left side.  Her right hip joint space is well-maintained and normal appearing.  The upper aspect of the pelvis shows her lower lumbar spine that does have arthritic changes.  2 views of the cervical spine show degenerative change at the mid cervical spine.  I would like to send her to outpatient physical therapy for any modalities for both her neck and her lumbar spine to see if this will help.  She does not want to go back to my partner and would like to wait to potentially see our new spine specialist when he arrives.  I would like to see her back in 6 weeks to see how she is done from therapy.  She agrees to this treatment plan.

## 2021-12-08 ENCOUNTER — Other Ambulatory Visit: Payer: Self-pay

## 2021-12-08 DIAGNOSIS — M4317 Spondylolisthesis, lumbosacral region: Secondary | ICD-10-CM

## 2021-12-08 DIAGNOSIS — M79601 Pain in right arm: Secondary | ICD-10-CM

## 2021-12-11 ENCOUNTER — Telehealth: Payer: Self-pay | Admitting: Orthopaedic Surgery

## 2021-12-11 NOTE — Telephone Encounter (Signed)
Pt called requesting a call back from Dr. [name]. Pt states she cant hold anything in her bad hand/arm. Please call pt at [phone]

## 2021-12-11 NOTE — Telephone Encounter (Signed)
Tried calling patient Voicemail not set up

## 2021-12-12 NOTE — Telephone Encounter (Signed)
Called patient again, no answer-no voicemail set up

## 2021-12-13 ENCOUNTER — Telehealth: Payer: Self-pay | Admitting: Orthopaedic Surgery

## 2021-12-13 NOTE — Telephone Encounter (Signed)
Pt returned call to Jean [name] Please call her back at [phone].

## 2021-12-27 ENCOUNTER — Encounter: Payer: Self-pay | Admitting: Orthopaedic Surgery

## 2021-12-27 ENCOUNTER — Ambulatory Visit (INDEPENDENT_AMBULATORY_CARE_PROVIDER_SITE_OTHER): Payer: Medicaid Other | Admitting: Orthopaedic Surgery

## 2021-12-27 DIAGNOSIS — M47816 Spondylosis without myelopathy or radiculopathy, lumbar region: Secondary | ICD-10-CM

## 2021-12-27 DIAGNOSIS — M4317 Spondylolisthesis, lumbosacral region: Secondary | ICD-10-CM | POA: Diagnosis not present

## 2021-12-27 DIAGNOSIS — M542 Cervicalgia: Secondary | ICD-10-CM | POA: Diagnosis not present

## 2021-12-27 DIAGNOSIS — M545 Low back pain, unspecified: Secondary | ICD-10-CM | POA: Diagnosis not present

## 2021-12-27 DIAGNOSIS — G8929 Other chronic pain: Secondary | ICD-10-CM

## 2021-12-27 NOTE — Progress Notes (Signed)
The patient continues to have significant numbness and tingling weakness in the right upper extremity.  She has a MRI of her cervical spine from 2020 that showed severe foraminal narrowing to the right side at C7-T1.  On my exam today she has significant numbness in her right hand.  It does seem to be more in the C7 distribution.  She has a positive Spurling sign to the right side and there is weakness in that right upper extremity.  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  She also significant neural foraminal narrowing at other levels and I believe this is likely also contributing to her right upper extremity radicular symptoms.

## 2021-12-28 ENCOUNTER — Other Ambulatory Visit: Payer: Self-pay

## 2021-12-28 DIAGNOSIS — M4317 Spondylolisthesis, lumbosacral region: Secondary | ICD-10-CM

## 2021-12-28 DIAGNOSIS — M542 Cervicalgia: Secondary | ICD-10-CM

## 2021-12-28 DIAGNOSIS — M545 Low back pain, unspecified: Secondary | ICD-10-CM

## 2021-12-28 DIAGNOSIS — M47816 Spondylosis without myelopathy or radiculopathy, lumbar region: Secondary | ICD-10-CM

## 2021-12-28 DIAGNOSIS — G8929 Other chronic pain: Secondary | ICD-10-CM

## 2022-02-26 ENCOUNTER — Other Ambulatory Visit: Payer: Self-pay

## 2022-02-26 DIAGNOSIS — M25559 Pain in unspecified hip: Secondary | ICD-10-CM

## 2022-02-26 MED ORDER — CYCLOBENZAPRINE HCL 10 MG PO TABS: ORAL_TABLET | ORAL | 3 refills | Status: DC

## 2022-02-28 ENCOUNTER — Encounter: Payer: Medicaid Other | Admitting: Student

## 2022-03-07 ENCOUNTER — Encounter: Payer: Medicaid Other | Admitting: Student

## 2022-05-18 ENCOUNTER — Other Ambulatory Visit: Payer: Self-pay | Admitting: Gastroenterology

## 2022-05-18 ENCOUNTER — Other Ambulatory Visit: Payer: Self-pay | Admitting: Internal Medicine

## 2022-05-18 DIAGNOSIS — J449 Chronic obstructive pulmonary disease, unspecified: Secondary | ICD-10-CM

## 2022-05-22 ENCOUNTER — Other Ambulatory Visit: Payer: Self-pay

## 2022-05-22 MED ORDER — PANTOPRAZOLE SODIUM 40 MG PO TBEC: 40.0000 mg | DELAYED_RELEASE_TABLET | Freq: Every day | ORAL | 1 refills | Status: DC

## 2022-05-24 ENCOUNTER — Other Ambulatory Visit: Payer: Self-pay | Admitting: Gastroenterology

## 2022-05-24 DIAGNOSIS — J449 Chronic obstructive pulmonary disease, unspecified: Secondary | ICD-10-CM

## 2022-06-19 ENCOUNTER — Telehealth: Payer: Self-pay

## 2022-06-19 ENCOUNTER — Ambulatory Visit (INDEPENDENT_AMBULATORY_CARE_PROVIDER_SITE_OTHER): Payer: Medicaid Other | Admitting: Internal Medicine

## 2022-06-19 VITALS — BP 143/101 | HR 83 | Temp 98.0°F | Ht 64.0 in | Wt 131.4 lb

## 2022-06-19 DIAGNOSIS — I10 Essential (primary) hypertension: Secondary | ICD-10-CM | POA: Diagnosis not present

## 2022-06-19 DIAGNOSIS — R42 Dizziness and giddiness: Secondary | ICD-10-CM | POA: Insufficient documentation

## 2022-06-19 DIAGNOSIS — Z87891 Personal history of nicotine dependence: Secondary | ICD-10-CM

## 2022-06-19 DIAGNOSIS — I499 Cardiac arrhythmia, unspecified: Secondary | ICD-10-CM | POA: Diagnosis present

## 2022-06-19 MED ORDER — CLOPIDOGREL BISULFATE 75 MG PO TABS: 75.0000 mg | ORAL_TABLET | Freq: Every day | ORAL | 0 refills | Status: AC

## 2022-06-19 MED ORDER — ATORVASTATIN CALCIUM 40 MG PO TABS: 40.0000 mg | ORAL_TABLET | Freq: Every day | ORAL | 2 refills | Status: DC

## 2022-06-19 MED ORDER — AMLODIPINE BESYLATE 10 MG PO TABS: 10.0000 mg | ORAL_TABLET | Freq: Every day | ORAL | 2 refills | Status: DC

## 2022-06-19 MED ORDER — AMLODIPINE BESYLATE 10 MG PO TABS: 10.0000 mg | ORAL_TABLET | Freq: Every day | ORAL | 0 refills | Status: DC

## 2022-06-19 NOTE — Assessment & Plan Note (Addendum)
Pt presenting with acute onset dizziness followed by expressive aphasia that has resolved. Ddx for dizziness is broad including orthostatic hypotension vs BPPV vs severe cervical stenosis vs CVA. Pts vitals equivocal given BP drops >20 mmHg from supine to sitting but stable from sitting to standing where she is reporting symptoms. Ddx for dizziness with expressive aphasia concerning for TIA vs CVA. Neuro exam is unremarkable. She has limitation in her LUE that is due to her chronic cervical pain. She states this is not an acute finding. Dix-hallpike is negative. Her cardiac exam is normal without any arrhythmia. Patient had similar symptoms in 2019 where a Code Stroke was called and but imaging was normal. She was discharged from the ED at that time with neurology follow up. Advised pt of our recommendation for the patient to go the ED for further evaluation given concern for CVA vs TIA but pt refused stating wants to go home and she will be fine going home. Pt aware of our concern of stroke but she states if this occurs again, then she will call 911 and seek emergent care. Given pt will not proceed with ED evaluation we will start pt on plavix 75 mg qd for 21 days and lipitor 40 mg qd. Will order CTH stat and bilateral carotid doppler. Given her concern for fluttering sensation, will refer her to cardiology for placement of ziopatch vs loop recorder placement. Will refill pt's amlodipine 10 mg qd. Will have patient return to clinic next week for close follow up. Re-enforced recommendation for ED evaluation but pt states she wants to pursue outpatient workup.

## 2022-06-19 NOTE — Telephone Encounter (Signed)
RTC to patient is currently having vertigo for the past couple of days when she gets up.  Has other things going on as well but has an appointment next week for those issues.. Given an appointment for 3:45 PM this afternoon Vertigo.

## 2022-06-19 NOTE — Progress Notes (Signed)
See below

## 2022-06-19 NOTE — Assessment & Plan Note (Signed)
Pt has been out of her amlodipine 10 mg qd for over one week. Her BP is elevated today. She is not reporting headache, chest pain or shortness of breath. Advised pt to resume this medication. Refill ordered. Will follow up pt's BP at next week's follow up.

## 2022-06-19 NOTE — Patient Instructions (Addendum)
[name], it was a pleasure seeing you today! You endorsed feeling well today. Below are some of the things we talked about this visit. We look forward to seeing you in the follow up appointment!  Today we discussed: You presented with dizziness but had symptoms concerning for stroke or mini stroke.  We recommend you go to the ED but you stated you do not want to go there.  We would like start you on a few medications including medicine for your cholesterol, and Plavix.  Please take these medications starting today.  I am when to refill your amlodipine and take that medication tomorrow. Again we recommended you to go to the ED but you stated you wanted to go home and did not want to go to the ED.  You stated you will be fine. We will set up imaging and cardiology referral for you.  You may need a neurology referral based on the imaging.  I have ordered the following labs today:  Lab Orders  No laboratory test(s) ordered today      Referrals ordered today:   Referral Orders         Ambulatory referral to Cardiology      I have ordered the following medication/changed the following medications:   Stop the following medications: Medications Discontinued During This Encounter  Medication Reason   amLODipine (NORVASC) 10 MG tablet Reorder     Start the following medications: Meds ordered this encounter  Medications   clopidogrel (PLAVIX) 75 MG tablet    Sig: Take 1 tablet (75 mg total) by mouth daily for 21 days.    Dispense:  21 tablet    Refill:  0   atorvastatin (LIPITOR) 40 MG tablet    Sig: Take 1 tablet (40 mg total) by mouth daily.    Dispense:  30 tablet    Refill:  2   amLODipine (NORVASC) 10 MG tablet    Sig: Take 1 tablet (10 mg total) by mouth daily.    Dispense:  90 tablet    Refill:  0    DX Code Needed  .     Follow-up: follow up at next scheduled visit  Please make sure to arrive 15 minutes prior to your next appointment. If you arrive late, you  may be asked to reschedule.   We look forward to seeing you next time. Please call our clinic at [phone] if you have any questions or concerns. The best time to call is Monday-Friday from 9am-4pm, but there is someone available 24/7. If after hours or the weekend, call the main hospital number and ask for the Internal Medicine Resident On-Call. If you need medication refills, please notify your pharmacy one week in advance and they will send [place] a request.  Thank you for letting us take part in your care. Wishing you the best!  Thank you, Idamae Schuller, MD

## 2022-06-19 NOTE — Telephone Encounter (Signed)
Pt is requesting a call back ... She stated that this morning she is having vertigo every time she gets up .Jean [name] Her appt with PCP was in April but I changed it to the earliest with Dr Alfonse Spruce  2/22 @ 8:45
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
## 2022-06-19 NOTE — Progress Notes (Signed)
Internal Medicine Clinic Attending  I saw and evaluated the patient.  I personally confirmed the key portions of the history and exam documented by Dr. Humphrey Rolls and I reviewed pertinent patient test results.  The assessment, diagnosis, and plan were formulated together and I agree with the documentation in the resident's note.    57yo woman with HTN presents for acute episodes of dizziness and expressive aphasia, that have now resolved. This morning, when she woke up and stood up to use the bathroom, she became dizzy. This happened again when she stood up for a second time. She also had difficulty speaking this morning, unable to find the words she was looking for. This has all resolved since then. In clinic today, vitals notable for BP 140/100. She is not orthostatic, and was not dizzy with standing in clinic. Neurologic exam notable for chronic left arm weakness, but otherwise normal.   I am concerned that her episodes of dizziness and expressive aphasia this morning were due to TIAs. Also on my ddx was orthostatic hypotension (now resolved). I recommended that she go to the ER for expedited workup including head imaging. She understands why I recommend this, and declined to go.   We will treat as if this were TIA. Add Plavix 51m daily to her aspirin 881mdaily for 21 days, and start on Atorvastatin 4012maily. Will obtain outpatient CT head, carotid dopplers, and heart monitoring. We were unable to check UDS or serum tox today, as lab was closed late in the afternoon, but I'd recommend checking these if symptoms recur - cocaine positive when she had similar symptoms previously. We counseled her extensively on warning signs and return precautions, and she expressed understanding. We will follow up with her in 1 week, scheduled for 2/22.

## 2022-06-20 ENCOUNTER — Ambulatory Visit (HOSPITAL_COMMUNITY)
Admission: RE | Admit: 2022-06-20 | Discharge: 2022-06-20 | Disposition: A | Discharge status: Home / Self Care | Payer: Medicaid Other | Source: Ambulatory Visit | Attending: Internal Medicine | Admitting: Internal Medicine

## 2022-06-20 DIAGNOSIS — R42 Dizziness and giddiness: Secondary | ICD-10-CM | POA: Insufficient documentation

## 2022-06-28 ENCOUNTER — Telehealth: Payer: Self-pay

## 2022-06-28 ENCOUNTER — Encounter: Payer: Medicaid Other | Admitting: Student

## 2022-06-28 NOTE — Telephone Encounter (Signed)
Patient called she is returning your call please return call.

## 2022-07-05 NOTE — Progress Notes (Signed)
Cardiology Office Note:    Date:  07/06/2022   ID:  AJ STELLWAGEN, DOB [birth year], MRN [id number]  PCP:  Linus Galas, MD   [place] Providers Cardiologist:  Lenna Sciara, MD Referring MD: Lottie Mussel, MD   Chief Complaint/Reason for Referral: Palpitations  PATIENT DID NOT APPEAR FOR APPOINTMENT   ASSESSMENT:    1. Palpitations     PLAN:    In order of problems listed above: 1.  Palpitations: Will check reflex TSH, echocardiogram, and monitor. 2.  Hyperlipidemia: This is being followed by the patient's primary care provider. 3.  Hypertension: Given chronic kidney disease we will stop amlodipine 10 mg and start losartan 25 mg daily.  Check BMP in 1 week. 4.  Chronic kidney disease stage III: Start losartan as detailed above.              History of Present Illness:    FOCUSED PROBLEM LIST:   1.  Hyperlipidemia 2.  Hypertension 3.  CKD stage III  The patient is a 57 y.o. female with the indicated medical history here for recommendations regarding dizziness.  The patient was seen at the internal medicine resident clinic recently.  At that time she complained of some dizziness.  She also had an expressive aphasia.  Because of this concern she was started on Plavix and Lipitor.  CT of the head and carotid Dopplers were also ordered.  She has a history of hypertensive urgency and in 2019 had neurologic evaluation which was negative.  Because of palpitations that were also reported she is referred for cardiology consultation.           Current Medications: No outpatient medications have been marked as taking for the 07/06/22 encounter (Office Visit) with Early Osmond, MD.   Current Facility-Administered Medications for the 07/06/22 encounter (Office Visit) with Early Osmond, MD  Medication   0.9 %  sodium chloride infusion     Allergies:    Lisinopril   Social History:   Social History   Tobacco Use   Smoking status: Former     Packs/day: 0.50    Years: 29.00    Total pack years: 14.50    Types: Cigarettes    Quit date: 11/25/2018    Years since quitting: 3.6   Smokeless tobacco: Never  Vaping Use   Vaping Use: Former  Substance Use Topics   Alcohol use: Yes    Alcohol/week: 3.0 standard drinks of alcohol    Types: 3 Cans of beer per week    Comment: Occasional Beer    Drug use: No    Types: "Crack" cocaine, Cocaine    Comment: last 6 months ago.     Family Hx: Family History  Problem Relation Age of Onset   Cancer Mother    Hypertension Mother    Diabetes Mother    Breast cancer Mother        diagnosed in her 47's   Stroke Father    Breast cancer Cousin    Colon cancer Neg Hx    Colon polyps Neg Hx    Esophageal cancer Neg Hx    Rectal cancer Neg Hx    Stomach cancer Neg Hx      Review of Systems:   Please see the history of present illness.    All other systems reviewed and are negative.     EKGs/Labs/Other Test Reviewed:    EKG:  EKG performed December 2022 that I personally reviewed demonstrates sinus bradycardia  with left ventricular hypertrophy  Prior CV studies: Cardiac Studies & Procedures       ECHOCARDIOGRAM  ECHOCARDIOGRAM COMPLETE 04/19/2021  Narrative ECHOCARDIOGRAM REPORT    Patient Name:   Jean [name] Date of Exam: 04/19/2021 Medical Rec #:  [id number]        Height:       64.0 in Accession #:    [id number]       Weight:       135.1 lb Date of Birth:  04-04-1966        BSA:          1.656 m Patient Age:    57 years         BP:           149/96 mmHg Patient Gender: F                HR:           57 bpm. Exam Location:  Inpatient  Procedure: 2D Echo, Cardiac Doppler and Color Doppler  Indications:    R94.31 Abnormal EKG  History:        Patient has prior history of Echocardiogram examinations, most recent 11/11/2018. COPD, Arrythmias:Bradycardia, Signs/Symptoms:Syncope and Dyspnea; Risk Factors:Hypertension.  Sonographer:    Glo Herring Referring  Phys: [place]   1. Left ventricular ejection fraction, by estimation, is 60 to 65%. The left ventricle has normal function. The left ventricle has no regional wall motion abnormalities. Left ventricular diastolic parameters are consistent with Grade I diastolic dysfunction (impaired relaxation). There is chordal SAM with LVOT gradient of 11 mm Hg, increasing to 18 mm Hg with Valsalva. 2. Right ventricular systolic function is normal. The right ventricular size is normal. Tricuspid regurgitation signal is inadequate for assessing PA pressure. 3. The mitral valve is grossly normal. No evidence of mitral valve regurgitation. No evidence of mitral stenosis. 4. The aortic valve is tricuspid. There is moderate calcification of the aortic valve. Aortic valve regurgitation is not visualized. Aortic valve sclerosis is present, with no evidence of aortic valve stenosis. Aortic valve area, by VTI measures 1.98 cm. Aortic valve mean gradient measures 7.0 mmHg.  Comparison(s): No significant change from prior study.  FINDINGS Left Ventricle: Left ventricular ejection fraction, by estimation, is 60 to 65%. The left ventricle has normal function. The left ventricle has no regional wall motion abnormalities. The left ventricular internal cavity size was small. There is borderline concentric left ventricular hypertrophy. Left ventricular diastolic parameters are consistent with Grade I diastolic dysfunction (impaired relaxation).  Right Ventricle: The right ventricular size is normal. No increase in right ventricular wall thickness. Right ventricular systolic function is normal. Tricuspid regurgitation signal is inadequate for assessing PA pressure.  Left Atrium: Left atrial size was normal in size.  Right Atrium: Right atrial size was normal in size.  Pericardium: There is no evidence of pericardial effusion.  Mitral Valve: The mitral valve is grossly normal. No evidence of mitral  valve regurgitation. No evidence of mitral valve stenosis.  Tricuspid Valve: The tricuspid valve is normal in structure. Tricuspid valve regurgitation is not demonstrated. No evidence of tricuspid stenosis.  Aortic Valve: The aortic valve is tricuspid. There is moderate calcification of the aortic valve. There is moderate aortic valve annular calcification. Aortic valve regurgitation is not visualized. Aortic valve sclerosis is present, with no evidence of aortic valve stenosis. Aortic valve mean gradient measures 7.0 mmHg. Aortic valve peak gradient measures 11.4 mmHg. Aortic valve  area, by VTI measures 1.98 cm.  Pulmonic Valve: The pulmonic valve was not well visualized. Pulmonic valve regurgitation is not visualized. No evidence of pulmonic stenosis.  Aorta: The aortic root and ascending aorta are structurally normal, with no evidence of dilitation.  IAS/Shunts: The atrial septum is grossly normal.   LEFT VENTRICLE PLAX 2D LVIDd:         3.70 cm     Diastology LVIDs:         2.50 cm     LV e' medial:    5.44 cm/s LV PW:         1.00 cm     LV E/e' medial:  11.7 LV IVS:        1.00 cm     LV e' lateral:   7.14 cm/s LVOT diam:     1.90 cm     LV E/e' lateral: 8.9 LV SV:         67 LV SV Index:   40 LVOT Area:     2.84 cm  LV Volumes (MOD) LV vol d, MOD A2C: 83.3 ml LV vol d, MOD A4C: 81.4 ml LV vol s, MOD A2C: 37.5 ml LV vol s, MOD A4C: 37.8 ml LV SV MOD A2C:     45.8 ml LV SV MOD A4C:     81.4 ml LV SV MOD BP:      44.8 ml  RIGHT VENTRICLE RV Basal diam:  3.00 cm RV S prime:     15.90 cm/s  LEFT ATRIUM             Index        RIGHT ATRIUM           Index LA diam:        3.20 cm 1.93 cm/m   RA Area:     13.30 cm LA Vol (A2C):   30.6 ml 18.48 ml/m  RA Volume:   28.00 ml  16.91 ml/m LA Vol (A4C):   33.8 ml 20.41 ml/m LA Biplane Vol: 33.5 ml 20.23 ml/m AORTIC VALVE                     PULMONIC VALVE AV Area (Vmax):    2.15 cm      PV Vmax:       1.11 m/s AV Area  (Vmean):   1.86 cm      PV Peak grad:  4.9 mmHg AV Area (VTI):     1.98 cm AV Vmax:           169.00 cm/s AV Vmean:          126.000 cm/s AV VTI:            0.338 m AV Peak Grad:      11.4 mmHg AV Mean Grad:      7.0 mmHg LVOT Vmax:         128.00 cm/s LVOT Vmean:        82.700 cm/s LVOT VTI:          0.236 m LVOT/AV VTI ratio: 0.70  AORTA Ao Root diam: 2.70 cm Ao Asc diam:  3.00 cm  MITRAL VALVE MV Area (PHT): 3.17 cm    SHUNTS MV Decel Time: 239 msec    Systemic VTI:  0.24 m MV E velocity: 63.70 cm/s  Systemic Diam: 1.90 cm MV A velocity: 65.20 cm/s MV E/A ratio:  0.98  Rudean Haskell MD Electronically signed by Rudean Haskell MD  Signature Date/Time: 04/19/2021/3:29:32 PM    Final             Other studies Reviewed: Review of the additional studies/records demonstrates: Imaging studies reviewed demonstrate no aortic atherosclerosis  Recent Labs: No results found for requested labs within last 365 days.   Recent Lipid Panel Lab Results  Component Value Date/Time   CHOL 230 (H) 07/08/2015 10:28 AM   TRIG 40 07/08/2015 10:28 AM   HDL 128 07/08/2015 10:28 AM   LDLCALC 94 07/08/2015 10:28 AM    Risk Assessment/Calculations:      Physical Exam:      Signed, Early Osmond, MD  07/06/2022 4:23 PM    [facility] [address], [place], [place]  [address] Phone: [phone]; Fax: [phone]   Note:  This document was prepared using Dragon voice recognition software and may include unintentional dictation errors.

## 2022-07-06 ENCOUNTER — Encounter: Payer: Self-pay | Admitting: Internal Medicine

## 2022-07-06 ENCOUNTER — Ambulatory Visit: Payer: Medicaid Other | Admitting: Internal Medicine

## 2022-07-06 DIAGNOSIS — R002 Palpitations: Secondary | ICD-10-CM

## 2022-07-11 ENCOUNTER — Encounter: Payer: Medicaid Other | Admitting: Student

## 2022-07-15 ENCOUNTER — Encounter: Payer: Self-pay | Admitting: Cardiovascular Disease

## 2022-07-15 NOTE — Progress Notes (Unsigned)
Cardiology Office Note:    Date:  07/18/2022   ID:  Jean [name], DOB [birth year], MRN [id number]  PCP:  Linus Galas, MD   [place] Providers Cardiologist:  Tonnya Garbett  Click to update primary MD,subspecialty MD or APP then REFRESH:1}    Referring MD: Lottie Mussel, MD   Chief Complaint  Patient presents with   Palpitations   Transient Ischemic Attack    History of Present Illness:    Jean [name] is a 57 y.o. female with a hx of  palpitations, HTN, COPD, dyspnea  She recently had symptoms suggestive of a ? Stroke or TIA   She is referred her for consideration of an event monitor vs. Implantable loop recorder   Pt has some cognitive issues .    History is very limited   She does not know exactly why she is here .  Describes some palpitations for a year.   Was hospitalized for a stroke in Dec. 2022 Was seen by Dr. Tamala Julian in consultation  Denies any chest pain , dyspnea  Exercises in a local park    Echo :  04/19/21 LVEF  is 60-65%, grade I DD  Cordal SAM  No MR   Eats lots of processed foods , processed meats.  BP is elevated.  Will start spironolactone 25 mg a day     Past Medical History:  Diagnosis Date   Adnexal mass 2019   likely remnant fibroid on broad ligament; getting diagnostic lap   Anxiety    on meds   Arthritis    hip-uses OTC meds   Asthma    uses inhaler as needed   Carpal tunnel syndrome    bilateral   Chest wall pain 10/15/2016   Chronic lower back pain    COPD (chronic obstructive pulmonary disease) (HCC)    uses in haler as needed   Depression    on meds   Dyspnea    with exertion   GERD (gastroesophageal reflux disease)    Headache    migraines   Herpes    Hypertension    on meds   Liver lesion, right lobe 12/01/2015   CT chest without contrast 11/30/15 with hepatic lesions measuring 2.0 x 1.7 cm of the right lobe and 1 x 1 cm on the dome. CT abd/pel 8/18: 1.3cm inferior R liver lobe, prob benign  but recommend 3-50mof/u with MRI w/wo IV contrast with attenuation MRI 6/19: combination of benign cavernous hemangiomas and small benign cysts   Poor dental hygiene    chipped upper front and several loose teeth   Seasonal allergies    Sickle cell trait ([facility]    Substance abuse ([facility]    cocaine/crack cocaine - last use 02/07/18   SVD (spontaneous vaginal delivery)    x 1   Tobacco abuse 07/08/2015   Uses walker    for ambulation   Uterine leiomyoma 02/16/2016   s/p vaginal hysterectomy 2018   Vaginal discharge 03/26/2018    Past Surgical History:  Procedure Laterality Date   LAPAROSCOPIC OVARIAN CYSTECTOMY Right 02/25/2018   Procedure: LAPAROSCOPIC REMOVAL OF RIGHT ADNEXAL MASS;  Surgeon: EChancy Milroy MD;  [facility];  Service: Gynecology;  Laterality: Right;   SALPINGECTOMY Left    lap - ectopic preg   TOTAL HIP ARTHROPLASTY Left 01/20/2016   Procedure: LEFT TOTAL HIP ARTHROPLASTY ANTERIOR APPROACH;  Surgeon: CMcarthur Rossetti MD;  Location: [facility];  Service: Orthopedics;  Laterality: Left;  TUBAL LIGATION     VAGINAL HYSTERECTOMY Right 12/11/2016   Procedure: HYSTERECTOMY VAGINAL WITH RIGHT SALPINGECTOMY;  Surgeon: Chancy Milroy, MD;  Location: [facility];  Service: Gynecology;  Laterality: Right;    Current Medications: Current Meds  Medication Sig   acetaminophen (TYLENOL) 500 MG tablet Take 500 mg by mouth every 6 (six) hours as needed for moderate pain.   amLODipine (NORVASC) 10 MG tablet Take 1 tablet (10 mg total) by mouth daily.   aspirin 81 MG chewable tablet Chew 81 mg by mouth daily.   atorvastatin (LIPITOR) 40 MG tablet Take 1 tablet (40 mg total) by mouth daily.   cyclobenzaprine (FLEXERIL) 10 MG tablet TAKE 1 TABLET BY MOUTH TWICE A DAY AS NEEDED FOR MUSCLE SPASMS   gabapentin (NEURONTIN) 300 MG capsule TAKE 1 CAPSULE BY MOUTH EVERYDAY AT BEDTIME   pantoprazole (PROTONIX) 40 MG tablet Take 1 tablet (40 mg total) by mouth daily.   sertraline  (ZOLOFT) 100 MG tablet TAKE 1 TABLET BY MOUTH EVERY DAY   spironolactone (ALDACTONE) 25 MG tablet Take 1 tablet (25 mg total) by mouth daily.   tetrahydrozoline 0.05 % ophthalmic solution Place 1 drop into both eyes daily as needed (for dry eyes).    VENTOLIN HFA 108 (90 Base) MCG/ACT inhaler INHALE 2 PUFFS INTO THE LUNGS EVERY 6 (SIX) HOURS AS NEEDED FOR WHEEZING OR SHORTNESS OF BREATH (COUGHING).   [DISCONTINUED] polyethylene glycol powder (GLYCOLAX/MIRALAX) 17 GM/SCOOP powder See prep instructions for use   Current Facility-Administered Medications for the 07/18/22 encounter (Office Visit) with Aditri Louischarles, Wonda Cheng, MD  Medication   0.9 %  sodium chloride infusion     Allergies:   Lisinopril   Social History   Socioeconomic History   Marital status: Single    Spouse name: Not on file   Number of children: 1   Years of education: Not on file   Highest education level: Not on file  Occupational History   Not on file  Tobacco Use   Smoking status: Former    Packs/day: 0.50    Years: 29.00    Total pack years: 14.50    Types: Cigarettes    Quit date: 11/25/2018    Years since quitting: 3.6   Smokeless tobacco: Never  Vaping Use   Vaping Use: Former  Substance and Sexual Activity   Alcohol use: Yes    Alcohol/week: 3.0 standard drinks of alcohol    Types: 3 Cans of beer per week    Comment: Occasional Beer    Drug use: No    Types: "Crack" cocaine, Cocaine    Comment: last 6 months ago.   Sexual activity: Yes    Partners: Male    Birth control/protection: None, Post-menopausal    Comment: Hysterectomy  Other Topics Concern   Not on file  Social History Narrative   Lives with Esmeralda Arthur   Caffeine use: soda sometimes.   Right handed    Social Determinants of Health   Financial Resource Strain: Not on file  Food Insecurity: Not on file  Transportation Needs: Not on file  Physical Activity: Not on file  Stress: Not on file  Social Connections: Not on file      Family History: The patient's family history includes Breast cancer in her cousin and mother; Cancer in her mother; Diabetes in her mother; Hypertension in her mother; Stroke in her father. There is no history of Colon cancer, Colon polyps, Esophageal cancer, Rectal cancer, or Stomach cancer.  ROS:  Please see the history of present illness.     All other systems reviewed and are negative.  EKGs/Labs/Other Studies Reviewed:    The following studies were reviewed today:   EKG:   July 18, 2022: Normal sinus rhythm at 66.  Voltage for left ventricular hypertrophy.  Recent Labs: No results found for requested labs within last 365 days.  Recent Lipid Panel    Component Value Date/Time   CHOL 230 (H) 07/08/2015 1028   TRIG 40 07/08/2015 1028   HDL 128 07/08/2015 1028   CHOLHDL 1.8 07/08/2015 1028   LDLCALC 94 07/08/2015 1028     Risk Assessment/Calculations:      HYPERTENSION CONTROL Vitals:   07/18/22 1500 07/18/22 1518  BP: (!) 152/90 (!) 155/95    The patient's blood pressure is elevated above target today.  In order to address the patient's elevated BP: A new medication was prescribed today.            Physical Exam:    VS:  BP (!) 155/95   Pulse 66   Ht '5\' 4"'$  (1.626 m)   Wt 134 lb 6.4 oz (61 kg)   LMP  (LMP Unknown)   SpO2 97%   BMI 23.07 kg/m     Wt Readings from Last 3 Encounters:  07/18/22 134 lb 6.4 oz (61 kg)  06/19/22 131 lb 6.4 oz (59.6 kg)  08/07/21 145 lb (65.8 kg)     GEN:  Well nourished, well developed in no acute distress HEENT: Normal NECK: No JVD; No carotid bruits LYMPHATICS: No lymphadenopathy CARDIAC: RRR, no murmurs, rubs, gallops RESPIRATORY:  Clear to auscultation without rales, wheezing or rhonchi  ABDOMEN: Soft, non-tender, non-distended MUSCULOSKELETAL:  No edema; No deformity  SKIN: Warm and dry NEUROLOGIC:  Alert and oriented x 3 PSYCHIATRIC:  Normal affect   ASSESSMENT:    1. Palpitations   2. Primary  hypertension   3. Medication management    PLAN:    In order of problems listed above:  Hypertension: Blood pressure remains elevated.  She admits to eating lots of salty foods and processed meats.  Her potassium levels are persistently low.  Will start her on spironolactone 25 mg a day.  I advised her to reduce her salt intake.  She will continue with her other antihypertensives.  2.  History of palpitations: She had palpitations.  The history is somewhat difficult because of her cognitive issues.  She has had episodes which were thought to be a TIA versus stroke in the past.  There is no evidence of previous stroke on CT scan.  Will place a 14-day event monitor for further evaluation.           Medication Adjustments/Labs and Tests Ordered: Current medicines are reviewed at length with the patient today.  Concerns regarding medicines are outlined above.  Orders Placed This Encounter  Procedures   Basic metabolic panel   LONG TERM MONITOR (3-14 DAYS)   EKG 12-Lead   Meds ordered this encounter  Medications   spironolactone (ALDACTONE) 25 MG tablet    Sig: Take 1 tablet (25 mg total) by mouth daily.    Dispense:  90 tablet    Refill:  3    Patient Instructions  Medication Instructions:  START Spironolactone '25mg'$  daily *If you need a refill on your cardiac medications before your next appointment, please call your pharmacy*   Lab Work: BMET in 2-3 weeks If you have labs (blood work) drawn today and your tests  are completely normal, you will receive your results only by: MyChart Message (if you have MyChart) OR A paper copy in the mail If you have any lab test that is abnormal or we need to change your treatment, we will call you to review the results.  Testing/Procedures: 14-day heart monitor (zio) Your physician has recommended that you wear an event monitor. Event monitors are medical devices that record the heart's electrical activity. Doctors most often [place] these  monitors to diagnose arrhythmias. Arrhythmias are problems with the speed or rhythm of the heartbeat. The monitor is a small, portable device. You can wear one while you do your normal daily activities. This is usually used to diagnose what is causing palpitations/syncope (passing out).  Follow-Up: At [facility], you and your health needs are our priority.  As part of our continuing mission to provide you with exceptional heart care, we have created designated Provider Care Teams.  These Care Teams include your primary Cardiologist (physician) and Advanced Practice Providers (APPs -  Physician Assistants and Nurse Practitioners) who all work together to provide you with the care you need, when you need it.  We recommend signing up for the patient portal called "MyChart".  Sign up information is provided on this After Visit Summary.  MyChart is used to connect with patients for Virtual Visits (Telemedicine).  Patients are able to view lab/test results, encounter notes, upcoming appointments, etc.  Non-urgent messages can be sent to your provider as well.   To learn more about what you can do with MyChart, go to [email or web address].    Your next appointment:   3 month(s)  Provider:   Mertie Moores, MD     Signed, Mertie Moores, MD  07/18/2022 5:27 PM    [place]

## 2022-07-18 ENCOUNTER — Ambulatory Visit (INDEPENDENT_AMBULATORY_CARE_PROVIDER_SITE_OTHER): Payer: Medicaid Other

## 2022-07-18 ENCOUNTER — Ambulatory Visit: Payer: Medicaid Other | Attending: Internal Medicine | Admitting: Cardiovascular Disease

## 2022-07-18 ENCOUNTER — Encounter: Payer: Self-pay | Admitting: Cardiovascular Disease

## 2022-07-18 VITALS — BP 155/95 | HR 66 | Ht 64.0 in | Wt 134.4 lb

## 2022-07-18 DIAGNOSIS — Z79899 Other long term (current) drug therapy: Secondary | ICD-10-CM | POA: Diagnosis not present

## 2022-07-18 DIAGNOSIS — R002 Palpitations: Secondary | ICD-10-CM | POA: Diagnosis not present

## 2022-07-18 DIAGNOSIS — I10 Essential (primary) hypertension: Secondary | ICD-10-CM

## 2022-07-18 MED ORDER — SPIRONOLACTONE 25 MG PO TABS: 25.0000 mg | ORAL_TABLET | Freq: Every day | ORAL | 3 refills | Status: DC

## 2022-07-18 NOTE — Progress Notes (Unsigned)
Applied a 14 day Zio XT monitor to patient in the office ?

## 2022-07-18 NOTE — Patient Instructions (Signed)
Medication Instructions:  START Spironolactone '25mg'$  daily *If you need a refill on your cardiac medications before your next appointment, please call your pharmacy*   Lab Work: BMET in 2-3 weeks If you have labs (blood work) drawn today and your tests are completely normal, you will receive your results only by: [place] (if you have MyChart) OR A paper copy in the mail If you have any lab test that is abnormal or we need to change your treatment, we will call you to review the results.  Testing/Procedures: 14-day heart monitor (zio) Your physician has recommended that you wear an event monitor. Event monitors are medical devices that record the heart's electrical activity. Doctors most often [place] these monitors to diagnose arrhythmias. Arrhythmias are problems with the speed or rhythm of the heartbeat. The monitor is a small, portable device. You can wear one while you do your normal daily activities. This is usually used to diagnose what is causing palpitations/syncope (passing out).  Follow-Up: At [facility], you and your health needs are our priority.  As part of our continuing mission to provide you with exceptional heart care, we have created designated Provider Care Teams.  These Care Teams include your primary Cardiologist (physician) and Advanced Practice Providers (APPs -  Physician Assistants and Nurse Practitioners) who all work together to provide you with the care you need, when you need it.  We recommend signing up for the patient portal called "MyChart".  Sign up information is provided on this After Visit Summary.  MyChart is used to connect with patients for Virtual Visits (Telemedicine).  Patients are able to view lab/test results, encounter notes, upcoming appointments, etc.  Non-urgent messages can be sent to your provider as well.   To learn more about what you can do with MyChart, go to [email or web address].    Your next appointment:   3  month(s)  Provider:   Mertie Moores, MD

## 2022-08-08 ENCOUNTER — Ambulatory Visit: Payer: Medicaid Other

## 2022-08-08 DIAGNOSIS — R002 Palpitations: Secondary | ICD-10-CM | POA: Diagnosis not present

## 2022-08-08 DIAGNOSIS — Z79899 Other long term (current) drug therapy: Secondary | ICD-10-CM | POA: Diagnosis not present

## 2022-08-08 DIAGNOSIS — I10 Essential (primary) hypertension: Secondary | ICD-10-CM | POA: Diagnosis not present

## 2022-08-16 ENCOUNTER — Ambulatory Visit: Payer: Medicaid Other | Attending: Cardiovascular Disease

## 2022-10-09 ENCOUNTER — Encounter: Payer: Self-pay | Admitting: *Deleted

## 2022-10-10 ENCOUNTER — Other Ambulatory Visit: Payer: Self-pay

## 2022-10-24 ENCOUNTER — Ambulatory Visit: Payer: Medicaid Other | Admitting: Cardiovascular Disease

## 2022-11-21 ENCOUNTER — Ambulatory Visit: Payer: Medicaid Other | Attending: Cardiovascular Disease | Admitting: Physician Assistant

## 2022-11-21 ENCOUNTER — Encounter: Payer: Self-pay | Admitting: Physician Assistant

## 2022-11-21 VITALS — BP 122/78 | HR 68 | Ht 64.0 in | Wt 128.8 lb

## 2022-11-21 DIAGNOSIS — I10 Essential (primary) hypertension: Secondary | ICD-10-CM

## 2022-11-21 DIAGNOSIS — R002 Palpitations: Secondary | ICD-10-CM | POA: Diagnosis not present

## 2022-11-21 DIAGNOSIS — Z79899 Other long term (current) drug therapy: Secondary | ICD-10-CM | POA: Diagnosis not present

## 2022-11-21 DIAGNOSIS — E785 Hyperlipidemia, unspecified: Secondary | ICD-10-CM

## 2022-11-21 MED ORDER — AMLODIPINE BESYLATE 10 MG PO TABS: 10.0000 mg | ORAL_TABLET | Freq: Every day | ORAL | 3 refills | Status: DC

## 2022-11-21 MED ORDER — METOPROLOL TARTRATE 25 MG PO TABS: 12.5000 mg | ORAL_TABLET | Freq: Two times a day (BID) | ORAL | 2 refills | Status: DC

## 2022-11-21 MED ORDER — ATORVASTATIN CALCIUM 40 MG PO TABS: 40.0000 mg | ORAL_TABLET | Freq: Every day | ORAL | 3 refills | Status: AC

## 2022-11-21 MED ORDER — ASPIRIN 81 MG PO CHEW: 81.0000 mg | CHEWABLE_TABLET | Freq: Every day | ORAL | 3 refills | Status: DC

## 2022-11-21 NOTE — Patient Instructions (Addendum)
Medication Instructions:   START TAKING :  METOPROLOL 12.5 MG TWICE A DAY   *If you need a refill on your cardiac medications before your next appointment, please call your pharmacy*   Lab Work: NONE ORDERED  TODAY   If you have labs (blood work) drawn today and your tests are completely normal, you will receive your results only by: MyChart Message (if you have MyChart) OR A paper copy in the mail If you have any lab test that is abnormal or we need to change your treatment, we will call you to review the results.   Testing/Procedures:  NONE ORDERED  TODAY    Follow-Up: At [facility], you and your health needs are our priority.  As part of our continuing mission to provide you with exceptional heart care, we have created designated Provider Care Teams.  These Care Teams include your primary Cardiologist (physician) and Advanced Practice Providers (APPs -  Physician Assistants and Nurse Practitioners) who all work together to provide you with the care you need, when you need it.  We recommend signing up for the patient portal called "MyChart".  Sign up information is provided on this After Visit Summary.  MyChart is used to connect with patients for Virtual Visits (Telemedicine).  Patients are able to view lab/test results, encounter notes, upcoming appointments, etc.  Non-urgent messages can be sent to your provider as well.   To learn more about what you can do with MyChart, go to [email or web address].    Your next appointment:    6 month(s)  Provider:    Kristeen Miss, MD     Other Instructions  Low-Sodium Eating Plan Salt (sodium) helps you keep a healthy balance of fluids in your body. Too much sodium can raise your blood pressure. It can also cause fluid and waste to be held in your body. Your health care provider or dietitian may recommend a low-sodium eating plan if you have high blood pressure (hypertension), kidney disease, liver disease, or heart  failure. Eating less sodium can help lower your blood pressure and reduce swelling. It can also protect your heart, liver, and kidneys. What are tips for following this plan? Reading food labels  Check food labels for the amount of sodium per serving. If you eat more than one serving, you must multiply the listed amount by the number of servings. Choose foods with less than 140 milligrams (mg) of sodium per serving. Avoid foods with 300 mg of sodium or more per serving. Always check how much sodium is in a product, even if the label says "unsalted" or "no salt added." Shopping  Buy products labeled as "low-sodium" or "no salt added." Buy fresh foods. Avoid canned foods and pre-made or frozen meals. Avoid canned, cured, or processed meats. Buy breads that have less than 80 mg of sodium per slice. Cooking  Eat more home-cooked food. Try to eat less restaurant, buffet, and fast food. Try not to add salt when you cook. Use salt-free seasonings or herbs instead of table salt or sea salt. Check with your provider or pharmacist before using salt substitutes. Cook with plant-based oils, such as canola, sunflower, or olive oil. Meal planning When eating at a restaurant, ask if your food can be made with less salt or no salt. Avoid dishes labeled as brined, pickled, cured, or smoked. Avoid dishes made with soy sauce, miso, or teriyaki sauce. Avoid foods that have monosodium glutamate (MSG) in them. MSG may be added to some restaurant food,  sauces, soups, bouillon, and canned foods. Make meals that can be grilled, baked, poached, roasted, or steamed. These are often made with less sodium. General information Try to limit your sodium intake to 1,500-2,300 mg each day, or the amount told by your provider. What foods should I eat? Fruits Fresh, frozen, or canned fruit. Fruit juice. Vegetables Fresh or frozen vegetables. "No salt added" canned vegetables. "No salt added" tomato sauce and paste.  Low-sodium or reduced-sodium tomato and vegetable juice. Grains Low-sodium cereals, such as oats, puffed wheat and rice, and shredded wheat. Low-sodium crackers. Unsalted rice. Unsalted pasta. Low-sodium bread. Whole grain breads and whole grain pasta. Meats and other proteins Fresh or frozen meat, poultry, seafood, and fish. These should have no added salt. Low-sodium canned tuna and salmon. Unsalted nuts. Dried peas, beans, and lentils without added salt. Unsalted canned beans. Eggs. Unsalted nut butters. Dairy Milk. Soy milk. Cheese that is naturally low in sodium, such as ricotta cheese, fresh mozzarella, or Swiss cheese. Low-sodium or reduced-sodium cheese. Cream cheese. Yogurt. Seasonings and condiments Fresh and dried herbs and spices. Salt-free seasonings. Low-sodium mustard and ketchup. Sodium-free salad dressing. Sodium-free light mayonnaise. Fresh or refrigerated horseradish. Lemon juice. Vinegar. Other foods Homemade, reduced-sodium, or low-sodium soups. Unsalted popcorn and pretzels. Low-salt or salt-free chips. The items listed above may not be all the foods and drinks you can have. Talk to a dietitian to learn more. What foods should I avoid? Vegetables Sauerkraut, pickled vegetables, and relishes. Olives. [place] fries. Onion rings. Regular canned vegetables, except low-sodium or reduced-sodium items. Regular canned tomato sauce and paste. Regular tomato and vegetable juice. Frozen vegetables in sauces. Grains Instant hot cereals. Bread stuffing, pancake, and biscuit mixes. Croutons. Seasoned rice or pasta mixes. Noodle soup cups. Boxed or frozen macaroni and cheese. Regular salted crackers. Self-rising flour. Meats and other proteins Meat or fish that is salted, canned, smoked, spiced, or pickled. Precooked or cured meat, such as sausages or meat loaves. Tomasa Blase. Ham. Pepperoni. Hot dogs. Corned beef. Chipped beef. Salt pork. Jerky. Pickled herring, anchovies, and sardines. Regular  canned tuna. Salted nuts. Dairy Processed cheese and cheese spreads. Hard cheeses. Cheese curds. Blue cheese. Feta cheese. String cheese. Regular cottage cheese. Buttermilk. Canned milk. Fats and oils Salted butter. Regular margarine. Ghee. Bacon fat. Seasonings and condiments Onion salt, garlic salt, seasoned salt, table salt, and sea salt. Canned and packaged gravies. Worcestershire sauce. Tartar sauce. Barbecue sauce. Teriyaki sauce. Soy sauce, including reduced-sodium soy sauce. Steak sauce. Fish sauce. Oyster sauce. Cocktail sauce. Horseradish that you find on the shelf. Regular ketchup and mustard. Meat flavorings and tenderizers. Bouillon cubes. Hot sauce. Pre-made or packaged marinades. Pre-made or packaged taco seasonings. Relishes. Regular salad dressings. Salsa. Other foods Salted popcorn and pretzels. Corn chips and puffs. Potato and tortilla chips. Canned or dried soups. Pizza. Frozen entrees and pot pies. The items listed above may not be all the foods and drinks you should avoid. Talk to a dietitian to learn more. This information is not intended to replace advice given to you by your health care provider. Make sure you discuss any questions you have with your health care provider. Document Revised: 05/10/2022 Document Reviewed: 05/10/2022 Elsevier Patient Education  2024 [employer] Inc[employer]   Heart-Healthy Eating Plan Eating a healthy diet is important for the health of your heart. A heart-healthy eating plan includes: Eating less unhealthy fats. Eating more healthy fats. Eating less salt in your food. Salt is also called sodium. Making other changes in your diet. Talk  with your doctor or a diet specialist (dietitian) to create an eating plan that is right for you. What is my plan? Your doctor may recommend an eating plan that includes: Total fat: ______% or less of total calories a day. Saturated fat: ______% or less of total calories a day. Cholesterol: less than _________mg a  day. Sodium: less than _________mg a day. What are tips for following this plan? Cooking Avoid frying your food. Try to bake, boil, grill, or broil it instead. You can also reduce fat by: Removing the skin from poultry. Removing all visible fats from meats. Steaming vegetables in water or broth. Meal planning  At meals, divide your plate into four equal parts: Fill one-half of your plate with vegetables and green salads. Fill one-fourth of your plate with whole grains. Fill one-fourth of your plate with lean protein foods. Eat 2-4 cups of vegetables per day. One cup of vegetables is: 1 cup (91 g) broccoli or cauliflower florets. 2 medium carrots. 1 large bell pepper. 1 large sweet potato. 1 large tomato. 1 medium white potato. 2 cups (150 g) raw leafy greens. Eat 1-2 cups of fruit per day. One cup of fruit is: 1 small apple 1 large banana 1 cup (237 g) mixed fruit, 1 large orange,  cup (82 g) dried fruit, 1 cup (240 mL) 100% fruit juice. Eat more foods that have soluble fiber. These are apples, broccoli, carrots, beans, peas, and barley. Try to get 20-30 g of fiber per day. Eat 4-5 servings of nuts, legumes, and seeds per week: 1 serving of dried beans or legumes equals  cup (90 g) cooked. 1 serving of nuts is  oz (12 almonds, 24 pistachios, or 7 walnut halves). 1 serving of seeds equals  oz (8 g). General information Eat more home-cooked food. Eat less restaurant, buffet, and fast food. Limit or avoid alcohol. Limit foods that are high in starch and sugar. Avoid fried foods. Lose weight if you are overweight. Keep track of how much salt (sodium) you eat. This is important if you have high blood pressure. Ask your doctor to tell you more about this. Try to add vegetarian meals each week. Fats Choose healthy fats. These include olive oil and canola oil, flaxseeds, walnuts, almonds, and seeds. Eat more omega-3 fats. These include salmon, mackerel, sardines, tuna,  flaxseed oil, and ground flaxseeds. Try to eat fish at least 2 times each week. Check food labels. Avoid foods with trans fats or high amounts of saturated fat. Limit saturated fats. These are often found in animal products, such as meats, butter, and cream. These are also found in plant foods, such as palm oil, palm kernel oil, and coconut oil. Avoid foods with partially hydrogenated oils in them. These have trans fats. Examples are stick margarine, some tub margarines, cookies, crackers, and other baked goods. What foods should I eat? Fruits All fresh, canned (in natural juice), or frozen fruits. Vegetables Fresh or frozen vegetables (raw, steamed, roasted, or grilled). Green salads. Grains Most grains. Choose whole wheat and whole grains most of the time. Rice and pasta, including brown rice and pastas made with whole wheat. Meats and other proteins Lean, well-trimmed beef, veal, pork, and lamb. Chicken and [place] without skin. All fish and shellfish. Wild duck, rabbit, pheasant, and venison. Egg whites or low-cholesterol egg substitutes. Dried beans, peas, lentils, and tofu. Seeds and most nuts. Dairy Low-fat or nonfat cheeses, including ricotta and mozzarella. Skim or 1% milk that is liquid, powdered, or evaporated.  Buttermilk that is made with low-fat milk. Nonfat or low-fat yogurt. Fats and oils Non-hydrogenated (trans-free) margarines. Vegetable oils, including soybean, sesame, sunflower, olive, peanut, safflower, corn, canola, and cottonseed. Salad dressings or mayonnaise made with a vegetable oil. Beverages Mineral water. Coffee and tea. Diet carbonated beverages. Sweets and desserts Sherbet, gelatin, and fruit ice. Small amounts of dark chocolate. Limit all sweets and desserts. Seasonings and condiments All seasonings and condiments. The items listed above may not be a complete list of foods and drinks you can eat. Contact a dietitian for more options. What foods should I  avoid? Fruits Canned fruit in heavy syrup. Fruit in cream or butter sauce. Fried fruit. Limit coconut. Vegetables Vegetables cooked in cheese, cream, or butter sauce. Fried vegetables. Grains Breads that are made with saturated or trans fats, oils, or whole milk. Croissants. Sweet rolls. Donuts. High-fat crackers, such as cheese crackers. Meats and other proteins Fatty meats, such as hot dogs, ribs, sausage, bacon, rib-eye roast or steak. High-fat deli meats, such as salami and bologna. Caviar. Domestic duck and goose. Organ meats, such as liver. Dairy Cream, sour cream, cream cheese, and creamed cottage cheese. Whole-milk cheeses. Whole or 2% milk that is liquid, evaporated, or condensed. Whole buttermilk. Cream sauce or high-fat cheese sauce. Yogurt that is made from whole milk. Fats and oils Meat fat, or shortening. Cocoa butter, hydrogenated oils, palm oil, coconut oil, palm kernel oil. Solid fats and shortenings, including bacon fat, salt pork, lard, and butter. Nondairy cream substitutes. Salad dressings with cheese or sour cream. Beverages Regular sodas and juice drinks with added sugar. Sweets and desserts Frosting. Pudding. Cookies. Cakes. Pies. Milk chocolate or white chocolate. Buttered syrups. Full-fat ice cream or ice cream drinks. The items listed above may not be a complete list of foods and drinks to avoid. Contact a dietitian for more information. Summary Heart-healthy meal planning includes eating less unhealthy fats, eating more healthy fats, and making other changes in your diet. Eat a balanced diet. This includes fruits and vegetables, low-fat or nonfat dairy, lean protein, nuts and legumes, whole grains, and heart-healthy oils and fats. This information is not intended to replace advice given to you by your health care provider. Make sure you discuss any questions you have with your health care provider. Document Revised: 05/29/2021 Document Reviewed: 05/29/2021 Elsevier  Patient Education  2024 [employer].

## 2022-11-21 NOTE — Progress Notes (Signed)
  Cardiology Office Note:  .   Date:  11/21/2022  ID:  [name], DOB 1965-11-30, MRN [id number] PCP: [name], DO  [facility] Providers Cardiologist:  Kristeen Miss, MD {   History of Present Illness: .   TANICKA BISAILLON is a 57 y.o. female with PMH of palpitations, hypertension, HLD, COPD, dyspnea, stoke/TIA?, and CKD stage III who presents for follow-up.   Of note she has some cognitive issues. History is limited. Was seen last 07/18/22 for palpitations by Dr. Elease Hashimoto. Stroke Dec 2022 and was seen in consultation then by Dr. Katrinka Blazing. Denied chest pain, dyspnea. Was exercising in a local park.  Today, she says today is a weird day. She said her BP is 122/78 which is unusal, she is more calmer than she has ever been. Weight is down as well. She has been doing some stuff on her own-no aid. Family is helping her. She is trying to be independent at this point. Income is the problem-we will try to keep all her medications on the 4 dollar list. He is on disability at the moment. She still has daily palpitations. We discussed a BB which she has never been on.  Reports no shortness of breath nor dyspnea on exertion. Reports no chest pain, pressure, or tightness. No edema, orthopnea, PND.    ROS: Pertinent ROS in HPI  Studies Reviewed: .       Cardiac monitor 07/18/2022   Predominately sinus rhythm   Rare PVCs, rare PACs   No significant arrhythmias observed     Patch Wear Time:  13 days and 23 hours ([id number] to 2024-03-27T15:[id number])   Patient had a min HR of 43 bpm, max HR of 129 bpm, and avg HR of 82 bpm. Predominant underlying rhythm was Sinus Rhythm. Isolated SVEs were rare (<1.0%), SVE Triplets were rare (<1.0%), and no SVE Couplets were present. Isolated VEs were rare (<1.0%),  and no VE Couplets or VE Triplets were present. Ventricular Bigeminy was present.       Physical Exam:   VS:  LMP  (LMP Unknown)    Wt Readings from Last 3  Encounters:  07/18/22 134 lb 6.4 oz (61 kg)  06/19/22 131 lb 6.4 oz (59.6 kg)  08/07/21 145 lb (65.8 kg)    GEN: Well nourished, well developed in no acute distress NECK: No JVD; No carotid bruits CARDIAC: RRR, no murmurs, rubs, gallops RESPIRATORY:  Clear to auscultation without rales, wheezing or rhonchi  ABDOMEN: Soft, non-tender, non-distended EXTREMITIES:  No edema; No deformity   ASSESSMENT AND PLAN: .   Palpitations -sometimes during the day when she is home, comes and goes -early in the morning, especially at night.  -Start metoprolol to tartrate 12.5 mg twice a day -avoid caffeine and alcohol -stay hydrated  HTN -Blood pressure well-controlled today -Continue amlodipine 10 mg daily, aspirin 81 mg daily, Lipitor 40 mg daily, spironolactone 25 mg daily  HLD -needs lipid panel at next appointment if not already done by PCP -continue Lipitor      Dispo: She can follow-up in 6 months with Dr. Elease Hashimoto sooner if needed  Signed, Sharlene Dory, PA-C

## 2022-11-29 ENCOUNTER — Encounter: Payer: Self-pay | Admitting: Student

## 2022-11-29 ENCOUNTER — Ambulatory Visit (INDEPENDENT_AMBULATORY_CARE_PROVIDER_SITE_OTHER): Payer: Medicaid Other | Admitting: Student

## 2022-11-29 VITALS — BP 129/72 | HR 54 | Temp 98.1°F | Ht 64.0 in | Wt 131.8 lb

## 2022-11-29 DIAGNOSIS — M791 Myalgia, unspecified site: Secondary | ICD-10-CM

## 2022-11-29 DIAGNOSIS — M542 Cervicalgia: Secondary | ICD-10-CM

## 2022-11-29 DIAGNOSIS — N1831 Chronic kidney disease, stage 3a: Secondary | ICD-10-CM

## 2022-11-29 DIAGNOSIS — E785 Hyperlipidemia, unspecified: Secondary | ICD-10-CM

## 2022-11-29 DIAGNOSIS — I129 Hypertensive chronic kidney disease with stage 1 through stage 4 chronic kidney disease, or unspecified chronic kidney disease: Secondary | ICD-10-CM

## 2022-11-29 DIAGNOSIS — M62838 Other muscle spasm: Secondary | ICD-10-CM

## 2022-11-29 DIAGNOSIS — Z8673 Personal history of transient ischemic attack (TIA), and cerebral infarction without residual deficits: Secondary | ICD-10-CM | POA: Diagnosis not present

## 2022-11-29 DIAGNOSIS — K219 Gastro-esophageal reflux disease without esophagitis: Secondary | ICD-10-CM

## 2022-11-29 DIAGNOSIS — R1310 Dysphagia, unspecified: Secondary | ICD-10-CM

## 2022-11-29 DIAGNOSIS — M25511 Pain in right shoulder: Secondary | ICD-10-CM

## 2022-11-29 DIAGNOSIS — M25559 Pain in unspecified hip: Secondary | ICD-10-CM

## 2022-11-29 DIAGNOSIS — I10 Essential (primary) hypertension: Secondary | ICD-10-CM

## 2022-11-29 DIAGNOSIS — G8929 Other chronic pain: Secondary | ICD-10-CM

## 2022-11-29 DIAGNOSIS — Z1231 Encounter for screening mammogram for malignant neoplasm of breast: Secondary | ICD-10-CM

## 2022-11-29 DIAGNOSIS — M25512 Pain in left shoulder: Secondary | ICD-10-CM

## 2022-11-29 DIAGNOSIS — Z131 Encounter for screening for diabetes mellitus: Secondary | ICD-10-CM

## 2022-11-29 MED ORDER — FAMOTIDINE 20 MG PO TABS: 20.0000 mg | ORAL_TABLET | Freq: Two times a day (BID) | ORAL | 1 refills | Status: DC

## 2022-11-29 MED ORDER — CYCLOBENZAPRINE HCL 10 MG PO TABS: ORAL_TABLET | ORAL | 3 refills | Status: DC

## 2022-11-29 NOTE — Assessment & Plan Note (Addendum)
Stroke in 2022. On ASA and Lipitor 40. Lipid panel today. PLAN: ASA, statin, BP control

## 2022-11-29 NOTE — Assessment & Plan Note (Signed)
This is solid food dysphagia without no choking episodes. It is painful at times. It is severe enough that she sometimes feels she must reach to her neck to help the food down. Denies n/v, fevers, SOB. Possible that this is related to her complaint of neck/shoulder/arm pain. A referral to GI was made by this clinic in the past but she did not make it. Per chart review this issues is ongoing as far back as 2020. PLAN: GI referral

## 2022-11-29 NOTE — Assessment & Plan Note (Addendum)
Pts chief complaint: B/L pain at the throat that extends to each shoulder and down both arms. This is a new pain over the past few months and it is worst in the afternoon. She describes muscle tightness and weakness and "pain in my veins." She notes that sometimes she is weak enough that she must use one arm to move the other. Exam is positive only for mild tenderness, no rashes, swelling, lesions. No focal deficits.   It is somewhat difficult to separate this pain from other issues: she does have chronic cervical pain for which she used to receive injections and she reports solid food dysphagia. However, she feels that this issue is new and different from her other pain. Though I feel this is most likely a MSK issue, my suspicion is enough to consider autoimmune like PMR. Additionally will check electrolytes and Mg.  PLAN: inflammatory markers CRP ESR, Mg, consider PT referral if those are negative

## 2022-11-29 NOTE — Progress Notes (Signed)
CC: shoulder pain  HPI:  Jean [name] is a 57 y.o. female living with a history stated below and presents today for checkup. Please see problem based assessment and plan for additional details.  Past Medical History:  Diagnosis Date   Adnexal mass 2019   likely remnant fibroid on broad ligament; getting diagnostic lap   Anxiety    on meds   Arthritis    hip-uses OTC meds   Asthma    uses inhaler as needed   Carpal tunnel syndrome    bilateral   Chest wall pain 10/15/2016   Chronic lower back pain    COPD (chronic obstructive pulmonary disease) (HCC)    uses in haler as needed   Depression    on meds   Dyspnea    with exertion   GERD (gastroesophageal reflux disease)    Headache    migraines   Herpes    Hypertension    on meds   Liver lesion, right lobe 12/01/2015   CT chest without contrast 11/30/15 with hepatic lesions measuring 2.0 x 1.7 cm of the right lobe and 1 x 1 cm on the dome. CT abd/pel 8/18: 1.3cm inferior R liver lobe, prob benign but recommend 3-50mo f/u with MRI w/wo IV contrast with attenuation MRI 6/19: combination of benign cavernous hemangiomas and small benign cysts   Poor dental hygiene    chipped upper front and several loose teeth   Seasonal allergies    Sickle cell trait (HCC)    Substance abuse (HCC)    cocaine/crack cocaine - last use 02/07/18   SVD (spontaneous vaginal delivery)    x 1   Tobacco abuse 07/08/2015   Uses walker    for ambulation   Uterine leiomyoma 02/16/2016   s/p vaginal hysterectomy 2018   Vaginal discharge 03/26/2018    Current Outpatient Medications on File Prior to Visit  Medication Sig Dispense Refill   acetaminophen (TYLENOL) 500 MG tablet Take 500 mg by mouth every 6 (six) hours as needed for moderate pain.     amLODipine (NORVASC) 10 MG tablet Take 1 tablet (10 mg total) by mouth daily. 90 tablet 3   aspirin 81 MG chewable tablet Chew 1 tablet (81 mg total) by mouth daily. 90 tablet 3   atorvastatin  (LIPITOR) 40 MG tablet Take 1 tablet (40 mg total) by mouth daily. 90 tablet 3   gabapentin (NEURONTIN) 300 MG capsule TAKE 1 CAPSULE BY MOUTH EVERYDAY AT BEDTIME 90 capsule 1   metoprolol tartrate (LOPRESSOR) 25 MG tablet Take 0.5 tablets (12.5 mg total) by mouth 2 (two) times daily. 90 tablet 2   pantoprazole (PROTONIX) 40 MG tablet TAKE 1 TABLET BY MOUTH EVERY DAY 90 tablet 1   sertraline (ZOLOFT) 100 MG tablet TAKE 1 TABLET BY MOUTH EVERY DAY 90 tablet 1   spironolactone (ALDACTONE) 25 MG tablet Take 1 tablet (25 mg total) by mouth daily. 90 tablet 3   tetrahydrozoline 0.05 % ophthalmic solution Place 1 drop into both eyes daily as needed (for dry eyes).      VENTOLIN HFA 108 (90 Base) MCG/ACT inhaler INHALE 2 PUFFS INTO THE LUNGS EVERY 6 (SIX) HOURS AS NEEDED FOR WHEEZING OR SHORTNESS OF BREATH (COUGHING). 18 each 1   Current Facility-Administered Medications on File Prior to Visit  Medication Dose Route Frequency Provider Last Rate Last Admin   0.9 %  sodium chloride infusion  500 mL Intravenous Once Rachael Fee, MD  Family History  Problem Relation Age of Onset   Cancer Mother    Hypertension Mother    Diabetes Mother    Breast cancer Mother        diagnosed in her 76's   Stroke Father    Breast cancer Cousin    Colon cancer Neg Hx    Colon polyps Neg Hx    Esophageal cancer Neg Hx    Rectal cancer Neg Hx    Stomach cancer Neg Hx     Social History   Socioeconomic History   Marital status: Single    Spouse name: Not on file   Number of children: 1   Years of education: Not on file   Highest education level: Not on file  Occupational History   Not on file  Tobacco Use   Smoking status: Former    Current packs/day: 0.00    Average packs/day: 0.5 packs/day for 29.0 years (14.5 ttl pk-yrs)    Types: Cigarettes    Start date: 11/24/1989    Quit date: 11/25/2018    Years since quitting: 4.0   Smokeless tobacco: Never  Vaping Use   Vaping status: Former   Substance and Sexual Activity   Alcohol use: Yes    Alcohol/week: 3.0 standard drinks of alcohol    Types: 3 Cans of beer per week    Comment: Occasional Beer    Drug use: No    Types: "Crack" cocaine, Cocaine    Comment: last 6 months ago.   Sexual activity: Yes    Partners: Male    Birth control/protection: None, Post-menopausal    Comment: Hysterectomy  Other Topics Concern   Not on file  Social History Narrative   Lives with Jean [name]   Caffeine use: soda sometimes.   Right handed    Social Determinants of Health   Financial Resource Strain: Not on file  Food Insecurity: Not on file  Transportation Needs: Not on file  Physical Activity: Not on file  Stress: Not on file  Social Connections: Not on file  Intimate Partner Violence: Not on file    Review of Systems: ROS negative except for what is noted on the assessment and plan.  Vitals:   11/29/22 1601  BP: 129/72  Pulse: (!) 54  Temp: 98.1 F (36.7 C)  TempSrc: Oral  SpO2: 99%  Weight: 131 lb 12.8 oz (59.8 kg)  Height: 5\' 4"  (1.626 m)    Physical Exam: Constitutional: well-appearing female sitting in wheelchair, in no acute distress HENT: normocephalic atraumatic, mucous membranes moist Eyes: conjunctiva non-erythematous Cardiovascular: regular rate and rhythm, no m/r/g Pulmonary/Chest: normal work of breathing on room air, lungs clear to auscultation bilaterally Abdominal: soft, non-tender, non-distended MSK: normal bulk and tone Neurological: alert & oriented x 3, no focal deficit Skin: warm and dry Psych: normal mood and behavior  Assessment & Plan:   Patient seen with Dr. Lafonda Mosses  Bilateral shoulder pain Pts chief complaint: B/L pain at the throat that extends to each shoulder and down both arms. This is a new pain over the past few months and it is worst in the afternoon. She describes muscle tightness and weakness and "pain in my veins." She notes that sometimes she is weak enough that she  must use one arm to move the other. Exam is positive only for mild tenderness, no rashes, swelling, lesions. No focal deficits.   It is somewhat difficult to separate this pain from other issues: she does have chronic cervical pain  for which she used to receive injections and she reports solid food dysphagia. However, she feels that this issue is new and different from her other pain. Though I feel this is most likely a MSK issue, my suspicion is enough to consider autoimmune like PMR. Additionally will check electrolytes and Mg.  PLAN: inflammatory markers CRP ESR, Mg, consider PT referral if those are negative  Cervicalgia Chronic cervical pain that she used to treat with injections, but has since been lost to follow up. Shooting pain that sometimes limits movement, no focal deficit. PLAN: Refer to ortho  GERD (gastroesophageal reflux disease) Frequent reflux that causes pain daily. She is reporting dysphagia as well - primarily solid foods and no choking episodes. She is reporting new dysphagia as well - primarily solid foods and no choking episodes. PLAN: continue pantoprazole 40, start pepcid 20 BID, refer to gastro per dysphagia  Dysphagia This is solid food dysphagia without no choking episodes. It is painful at times. It is severe enough that she sometimes feels she must reach to her neck to help the food down. Denies n/v, fevers, SOB. Possible that this is related to her complaint of neck/shoulder/arm pain. A referral to GI was made by this clinic in the past but she did not make it. Per chart review this issues is ongoing as far back as 2020. PLAN: GI referral  Chronic kidney disease, stage 3a (HCC) CKD3a most recent BMP in 2022. Primary RF appears to be HTN, which is currently controlled. PLAN: BMP today  HTN (hypertension) At goal. Denies headaches, chest pain, vision changes. Current regimen: Amlodipine 10 every day, spironolactone 25mg , lopressor 12.5 BID (started recently by  cardiology during palpitation workup)  History of cardioembolic cerebrovascular accident (CVA) Stroke in 2022. On ASA and Lipitor 40. Lipid panel today. PLAN: ASA, statin, BP control  Health maintenance: Colonoscopy and Mammogram referrals placed today  Handicap placard provided for 6 months to be reevaluated at that time. If she remains limited after reconnection with pain management, etc, will consider a more long term option.  [name], D.O. [facility] Internal Medicine, PGY-1 Phone: [phone] Date 11/29/2022 Time 5:16 PM

## 2022-11-29 NOTE — Patient Instructions (Signed)
Ms Rhudy,  Thank you for coming in today. This after visit summary is an important review of tests, referrals, and medication changes that were discussed during your visit. If you have questions or concerns, call the clinic at [phone] between 9am - 4pm. Outside of clinic business hours, call the main hospital at [phone] and ask the operator for the on-call internal medicine resident.   Best, Dr. [name]

## 2022-11-29 NOTE — Assessment & Plan Note (Addendum)
Frequent reflux that causes pain daily. She is reporting dysphagia as well - primarily solid foods and no choking episodes. She is reporting new dysphagia as well - primarily solid foods and no choking episodes. PLAN: continue pantoprazole 40, start pepcid 20 BID, refer to gastro per dysphagia

## 2022-11-29 NOTE — Assessment & Plan Note (Signed)
At goal. Denies headaches, chest pain, vision changes. Current regimen: Amlodipine 10 every day, spironolactone 25mg , lopressor 12.5 BID (started recently by cardiology during palpitation workup)

## 2022-11-29 NOTE — Assessment & Plan Note (Signed)
CKD3a most recent BMP in 2022. Primary RF appears to be HTN, which is currently controlled. PLAN: BMP today

## 2022-11-29 NOTE — Assessment & Plan Note (Signed)
Chronic cervical pain that she used to treat with injections, but has since been lost to follow up. Shooting pain that sometimes limits movement, no focal deficit. PLAN: Refer to ortho

## 2022-12-04 NOTE — Progress Notes (Signed)
Internal Medicine Clinic Attending  I was physically present during the key portions of the resident provided service and participated in the medical decision making of patient's management care. I reviewed pertinent patient test results.  The assessment, diagnosis, and plan were formulated together and I agree with the documentation in the resident's note.  Mercie Eon, MD

## 2022-12-10 ENCOUNTER — Ambulatory Visit (INDEPENDENT_AMBULATORY_CARE_PROVIDER_SITE_OTHER): Payer: Medicaid Other | Admitting: Physical Medicine and Rehabilitation

## 2022-12-10 DIAGNOSIS — M79601 Pain in right arm: Secondary | ICD-10-CM | POA: Diagnosis not present

## 2022-12-10 DIAGNOSIS — G8929 Other chronic pain: Secondary | ICD-10-CM | POA: Diagnosis not present

## 2022-12-10 DIAGNOSIS — M25511 Pain in right shoulder: Secondary | ICD-10-CM | POA: Diagnosis not present

## 2022-12-10 DIAGNOSIS — M5412 Radiculopathy, cervical region: Secondary | ICD-10-CM | POA: Diagnosis not present

## 2022-12-10 DIAGNOSIS — M25512 Pain in left shoulder: Secondary | ICD-10-CM

## 2022-12-10 DIAGNOSIS — M79602 Pain in left arm: Secondary | ICD-10-CM

## 2022-12-10 NOTE — Progress Notes (Signed)
Functional Pain Scale - descriptive words and definitions  Unmanageable (7)  Pain interferes with normal ADL's/nothing seems to help/sleep is very difficult/active distractions are very difficult to concentrate on. Severe range order  Average Pain 5-6  Neck pain on both sides that radiates into the head and down both arms causing numbness in both hands. Hard to sleep and raise left arm

## 2022-12-10 NOTE — Progress Notes (Signed)
Jean [name] - 57 y.o. female MRN [id number]  Date of birth: [birth year]  Office Visit Note: Visit Date: 12/10/2022 PCP: [name], DO Referred by: Mercie Eon, MD  Subjective: Chief Complaint  Patient presents with   Neck - Pain   HPI: Jean [name] is a 57 y.o. female who comes in today for evaluation of chronic, worsening and severe bilateral neck pain radiating shoulders and arms. Patient was initially referred to [place] by Dr. Doneen Poisson. Pain ongoing for several years, worsens with movement and activity. She describes as sore, aching and sharp pain, currently rates as 8 out of 10. Some relief of pain with home exercise regimen, rest and use of medications. History of formal physical therapy with minimal relief of pain. Cervical MRI imaging from 2020 exhibits severe foraminal stenosis at C3-C4 and C4-C5. There is also severe right neural foraminal narrowing at C7-T1. Patient underwent multiple cervical epidural steroid injections in our office in 2019. These injections provided some short term relief of pain, for about 1 month. Dr. Magnus Ivan ultimately referred her to [facility] for consult. She was seen by Dr. Hoyt Koch in 2023, per his notes he recommend obtaining new cervical spine MRI imaging. Patient was not able to do so due to other health concerns. Patient denies focal weakness, numbness and tingling. No recent trauma or falls. She does ambulate with rolling walker.       Review of Systems  Musculoskeletal:  Positive for neck pain.  Neurological:  Negative for tingling, sensory change, focal weakness and weakness.  All other systems reviewed and are negative.  Otherwise per HPI.  Assessment & Plan: Visit Diagnoses:    ICD-10-CM   1. Radiculopathy, cervical region  M54.12 MR CERVICAL SPINE WO CONTRAST    2. Chronic pain of both shoulders  M25.511 MR CERVICAL SPINE WO CONTRAST   G89.29    M25.512     3. Pain in both upper  extremities  M79.601    M79.602        Plan: Findings:  Chronic, worsening and severe bilateral neck pain radiating shoulders and arms. Patient continues to have severe pain despite good conservative therapies such as home exercise regimen, rest and use of medications. Patients clinical presentation and exam are consistent with C7 nerve pattern. Her exam today was difficult due to pain, however good strength noted to bilateral upper extremities. Next step is to obtain new cervical MRI imaging. Depending on results of imaging we would consider performing cervical epidural steroid injection. I also feel she would benefit from consult with surgeon. She is welcome to return to Dr. Maisie Fus or we could get her in with our partner Dr. Willia Craze to discuss options. Not sure she would be ideal surgical candidate as she does have history of COPD. I will have patient follow up for cervical MRI review. No red flag symptoms noted upon exam today.     Meds & Orders: No orders of the defined types were placed in this encounter.   Orders Placed This Encounter  Procedures   MR CERVICAL SPINE WO CONTRAST    Follow-up: Return for Cervical MRI follow up.   Procedures: No procedures performed      Clinical History: CLINICAL DATA:  Worsening neck pain and right upper extremity radiculopathy   EXAM: MRI CERVICAL SPINE WITHOUT CONTRAST   TECHNIQUE: Multiplanar, multisequence MR imaging of the cervical spine was performed. No intravenous contrast was administered.   COMPARISON:  Radiograph December 09, 2018   FINDINGS: Alignment: Physiologic   Vertebrae: The vertebral body heights are well maintained. No fracture, marrow edema,or pathologic marrow infiltration.   Cord: Normal signal and morphology.   Posterior Fossa, vertebral arteries, paraspinal tissues:   The visualized portion of the posterior fossa is unremarkable. Normal flow voids seen within the vertebral arteries. The paraspinal soft  tissues are unremarkable.   Disc levels:   C1-C2: Atlanto-axial junction is normal, without canal narrowing   C2-C3: There is a disc osteophyte complex which causes mild bilateral foraminal narrowing.   C3-C4: Disc osteophyte complex and uncovertebral osteophytes which causes severe bilateral neural foraminal narrowing and mild effacement anterior thecal sac.   C4-C5: There is a disc osteophyte complex and uncovertebral osteophytes which causes moderate to severe right and severe left neural foraminal narrowing. There is effacement anterior thecal sac.   C5-C6: There is disc osteophyte complex and uncovertebral osteophytes which causes moderate neural foraminal narrowing.   C6-C7: There is a disc osteophyte complex and uncovertebral osteophytes which causes moderate bilateral neural foraminal narrowing.   C7-T1: Disc osteophyte and uncovertebral osteophytes severe right and moderate left neural foraminal narrowing.   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 There is also severe right neural foraminal narrowing at C7-T1.     Electronically Signed   By: Jonna Clark M.D.   On: 04/15/2019 10:58   She reports that she quit smoking about 4 years ago. Her smoking use included cigarettes. She started smoking about 33 years ago. She has a 14.5 pack-year smoking history. She has never used smokeless tobacco.  Recent Labs    11/29/22 1644  HGBA1C 5.5    Objective:  VS:  HT:    WT:   BMI:     BP:   HR: bpm  TEMP: ( )  RESP:  Physical Exam Vitals and nursing note reviewed.  HENT:     Head: Normocephalic and atraumatic.     Right Ear: External ear normal.     Left Ear: External ear normal.     Nose: Nose normal.     Mouth/Throat:     Mouth: Mucous membranes are moist.  Eyes:     Extraocular Movements: Extraocular movements intact.   Cardiovascular:     Rate and Rhythm: Normal rate.     Pulses: Normal pulses.  Pulmonary:     Effort: Pulmonary effort is normal.  Abdominal:     General: Abdomen is flat. There is no distension.  Musculoskeletal:        General: Tenderness present.     Cervical back: Tenderness present.     Comments: Discomfort noted with flexion, extension and side-to-side rotation. Patient has good strength in the upper extremities including 5 out of 5 strength in wrist extension, long finger flexion and APB. Shoulder range of motion is full bilaterally without any sign of impingement. There is no atrophy of the hands intrinsically. Sensation intact bilaterally. Negative Hoffman's sign. Negative Spurling's sign.     Skin:    General: Skin is warm and dry.     Capillary Refill: Capillary refill takes less than 2 seconds.  Neurological:     Mental Status: She is alert and oriented to person, place, and time.     Gait: Gait abnormal.     Ortho Exam  Imaging: No results found.  Past Medical/Family/Surgical/Social History: Medications & Allergies reviewed per EMR, new medications updated.  Patient Active Problem List   Diagnosis Date Noted   Bilateral shoulder pain 11/29/2022   Dysphagia 11/29/2022   History of cardioembolic cerebrovascular accident (CVA) 11/29/2022   Dizziness 06/19/2022   Constipation 05/09/2021   Weight loss 05/09/2021   Headache    Symptomatic bradycardia 04/18/2021   Near syncope 04/18/2021   Synovitis of left knee 07/26/2020   COVID-19 virus infection 05/11/2020   Low back pain 11/24/2019   Impingement syndrome of right shoulder 04/22/2019   History of dental surgery 02/05/2019   COPD GOLD 0 ? 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   GERD (gastroesophageal reflux disease)    Breast pain 06/23/2018   Dyspareunia in female 09/13/2017   Cervicalgia 04/24/2017   Chronic kidney disease, stage 3a (HCC) 10/29/2016   Chronic  pain syndrome 05/10/2016   MDD (major depressive disorder) 10/19/2015   Dyspnea on exertion 08/22/2015   HTN (hypertension) 07/08/2015   Routine health maintenance 07/08/2015   Past Medical History:  Diagnosis Date   Adnexal mass 2019   likely remnant fibroid on broad ligament; getting diagnostic lap   Anxiety    on meds   Arthritis    hip-uses OTC meds   Asthma    uses inhaler as needed   Carpal tunnel syndrome    bilateral   Chest wall pain 10/15/2016   Chronic lower back pain    COPD (chronic obstructive pulmonary disease) (HCC)    uses in haler as needed   Depression    on meds   Dyspnea    with exertion   GERD (gastroesophageal reflux disease)    Headache    migraines   Herpes    Hypertension    on meds   Liver lesion, right lobe 12/01/2015   CT chest without contrast 11/30/15 with hepatic lesions measuring 2.0 x 1.7 cm of the right lobe and 1 x 1 cm on the dome. CT abd/pel 8/18: 1.3cm inferior R liver lobe, prob benign but recommend 3-33mo f/u with MRI w/wo IV contrast with attenuation MRI 6/19: combination of benign cavernous hemangiomas and small benign cysts   Poor dental hygiene    chipped upper front and several loose teeth   Seasonal allergies    Sickle cell trait (HCC)    Substance abuse (HCC)    cocaine/crack cocaine - last use 02/07/18   SVD (spontaneous vaginal delivery)    x 1   Tobacco abuse 07/08/2015   Uses walker    for ambulation   Uterine leiomyoma 02/16/2016   s/p vaginal hysterectomy 2018   Vaginal discharge 03/26/2018   Family History  Problem Relation Age of Onset   Cancer Mother    Hypertension Mother    Diabetes Mother    Breast cancer Mother        diagnosed in her 65's   Stroke Father    Breast cancer Cousin    Colon cancer Neg Hx    Colon polyps Neg Hx    Esophageal cancer Neg Hx    Rectal cancer Neg Hx    Stomach cancer Neg Hx    Past Surgical History:  Procedure Laterality Date   LAPAROSCOPIC OVARIAN CYSTECTOMY Right  02/25/2018   Procedure: LAPAROSCOPIC REMOVAL OF RIGHT ADNEXAL MASS;  Surgeon: Hermina Staggers, MD;  Location: [facility];  Service: Gynecology;  Laterality: Right;   SALPINGECTOMY Left    lap - ectopic preg   TOTAL HIP ARTHROPLASTY Left 01/20/2016  Procedure: LEFT TOTAL HIP ARTHROPLASTY ANTERIOR APPROACH;  Surgeon: Kathryne Hitch, MD;  Location: [facility];  Service: Orthopedics;  Laterality: Left;   TUBAL LIGATION     VAGINAL HYSTERECTOMY Right 12/11/2016   Procedure: HYSTERECTOMY VAGINAL WITH RIGHT SALPINGECTOMY;  Surgeon: Hermina Staggers, MD;  Location: [facility];  Service: Gynecology;  Laterality: Right;   Social History   Occupational History   Not on file  Tobacco Use   Smoking status: Former    Current packs/day: 0.00    Average packs/day: 0.5 packs/day for 29.0 years (14.5 ttl pk-yrs)    Types: Cigarettes    Start date: 11/24/1989    Quit date: 11/25/2018    Years since quitting: 4.0   Smokeless tobacco: Never  Vaping Use   Vaping status: Former  Substance and Sexual Activity   Alcohol use: Yes    Alcohol/week: 3.0 standard drinks of alcohol    Types: 3 Cans of beer per week    Comment: Occasional Beer    Drug use: No    Types: "Crack" cocaine, Cocaine    Comment: last 6 months ago.   Sexual activity: Yes    Partners: Male    Birth control/protection: None, Post-menopausal    Comment: Hysterectomy

## 2022-12-11 ENCOUNTER — Other Ambulatory Visit: Payer: Self-pay

## 2022-12-11 ENCOUNTER — Encounter: Payer: Self-pay | Admitting: Physical Medicine and Rehabilitation

## 2022-12-11 DIAGNOSIS — M25559 Pain in unspecified hip: Secondary | ICD-10-CM

## 2022-12-11 DIAGNOSIS — M62838 Other muscle spasm: Secondary | ICD-10-CM

## 2022-12-11 MED ORDER — GABAPENTIN 300 MG PO CAPS: ORAL_CAPSULE | ORAL | 1 refills | Status: DC

## 2022-12-11 MED ORDER — PANTOPRAZOLE SODIUM 40 MG PO TBEC: 40.0000 mg | DELAYED_RELEASE_TABLET | Freq: Every day | ORAL | 1 refills | Status: DC

## 2022-12-13 ENCOUNTER — Telehealth: Payer: Self-pay | Admitting: *Deleted

## 2022-12-13 NOTE — Telephone Encounter (Signed)
Mail box full -unable to leave message/  this is in regards to her mammogram appointment scheduled for August 28.2024 @ 2:10 pm at the breast center/ patient appointment mailed includes information regarding $75 no show fee charge.

## 2023-01-02 ENCOUNTER — Ambulatory Visit
Admission: RE | Admit: 2023-01-02 | Discharge: 2023-01-02 | Disposition: A | Discharge status: Home / Self Care | Payer: Medicaid Other | Source: Ambulatory Visit | Attending: Internal Medicine | Admitting: Internal Medicine

## 2023-01-02 DIAGNOSIS — Z1231 Encounter for screening mammogram for malignant neoplasm of breast: Secondary | ICD-10-CM

## 2023-01-05 ENCOUNTER — Other Ambulatory Visit: Payer: Self-pay | Admitting: Student

## 2023-01-11 ENCOUNTER — Encounter: Payer: Medicaid Other | Admitting: *Deleted

## 2023-01-11 VITALS — BP 144/94 | HR 88 | Temp 97.3°F | Resp 16 | Ht 64.0 in | Wt 131.8 lb

## 2023-01-11 DIAGNOSIS — Z006 Encounter for examination for normal comparison and control in clinical research program: Secondary | ICD-10-CM

## 2023-01-11 NOTE — Research (Cosign Needed Addendum)
AA HEART  Informed Consent   Subject Name: Jean [name]  Subject met inclusion and exclusion criteria.  The informed consent form, study requirements and expectations were reviewed with the subject and questions and concerns were addressed prior to the signing of the consent form.  The subject verbalized understanding of the trial requirements.  The subject agreed to participate in the AA HEART  trial and signed the informed consent at 10:40 AM on 01-11-2023 AM.  The informed consent was obtained prior to performance of any protocol-specific procedures for the subject.  A copy of the signed informed consent was given to the subject and a copy was placed in the subject's medical record.   [name]    Are there any labs that are clinically significant?  Yes []  OR No[]   Please FORWARD back to me with any changes or follow up!     ACCESSION NO. [id number]                                             Page 1 of 1                                                        INVESTIGATOR: ([id number])                          PROTOCOL   [id number]                     Thomasene Ripple, M.D.                              INVESTIGATOR NO.: [id number]                     c/o [facility]                         SUBJECT NUMBER: [id number]                     [place]                 SUBJECT INITIALS NOT COLLECTED:                     [address]                          VISIT: D0                     [place], [place] 0                   SPONSOR REPORT TO:                 COLLECTION TIME:11:23 DATE:11-Jan-2023                     [name]                 DATE RECEIVED IN  LABORATORY: 12-Jan-2023                     c/o Sponsor [employer] access         DATE REPORTED BY LABORATORY: 12-Jan-2023                     [place]                          SEX: F  AGE: 57y                     [id number]                   [name], [place] [phone]                                                                                         Ref. Ranges               Clinical    Comments                                                                          Significance                                                                            Yes*  No                    HEMATOLOGY&DIFFERENTIAL PANEL                      HGB            13.7         11.5-15.8 g/dL                      HCT            42           34-48 %                      RBC            4.3          3.9-5.5 x106/uL                      MCH            32           26-34 pg  MCHC           33           31-38 g/dL                      RDW            12.8         12.0-15.0 %                      RBC Morph      No Review Required                        MCV            97           80-100 fL                      WBC            5.67         3.80-10.70 x103/uL                      Neutrophil     2.71         1.96-7.23 x103/uL                      Lymphocyte     2.35         0.80-3.00 x103/uL                      Monocytes      0.36         0.12-0.92 x103/uL                      Eosinophil     0.23         0.00-0.57 x103/uL                      Basophils      0.03         0.00-0.20 x103/uL                      Neutrophil     47.9         40.5-75.0 %                      Lymphocyte     41.4         15.4-48.5%                      Monocytes      6.3          2.6-10.1 %                      Eosinophil     4.0          0.0-6.8 %                      Basophils      0.5          0.0-2.0 %                      Platelets      370  140-400 x103/uL    ANC                      ANC            2.71         1.96-7.23 x103/uL                    HBA1C                      Fast HbA1c     5.6          <6.5%   RETICULOCYTES                      Retic %        0.9          0.6-2.5 %                      Retic Abs      0.038        0.030-0.120 x106/uL     [id number] LPA  COLLECTION D/T                      Untimed D      11-Jan-2023                        Untimed T      11:23                            SM/PLASMA PROTEO & BMS D/T                      Untimed D      11-Jan-2023                        Untimed T      11:23                            SM/WB DNA COLLECTION D/T                      Untimed D      11-Jan-2023                        Untimed T      11:23                            SM/WB PAXGENE RNA COLLECT D/T                      Untimed D      11-Jan-2023                        Untimed T      11:23                            SUBJECT FASTED AT LEAST 9 HRS?                      Pt fast 9h     Yes

## 2023-01-13 ENCOUNTER — Ambulatory Visit
Admission: RE | Admit: 2023-01-13 | Discharge: 2023-01-13 | Disposition: A | Discharge status: Home / Self Care | Payer: Medicaid Other | Source: Ambulatory Visit | Attending: Physical Medicine and Rehabilitation | Admitting: Physical Medicine and Rehabilitation

## 2023-01-25 ENCOUNTER — Institutional Professional Consult (permissible substitution): Payer: Medicaid Other | Admitting: Pulmonary Disease

## 2023-01-29 ENCOUNTER — Encounter: Payer: Self-pay | Admitting: Pulmonary Disease

## 2023-01-29 ENCOUNTER — Telehealth: Payer: Self-pay | Admitting: *Deleted

## 2023-01-29 NOTE — Telephone Encounter (Signed)
Call from patient c/o right knee swelling that goes to toes at times.  Has had x 1 week.  The swelling comes and goes.  No available appointments in the Clinic  until October.  .  Patient to go to an Urgent Care.  Patient missed appointment to [facility]  was given number to call to reschedule.

## 2023-01-30 ENCOUNTER — Telehealth: Payer: Self-pay | Admitting: Physical Medicine and Rehabilitation

## 2023-01-30 DIAGNOSIS — M542 Cervicalgia: Secondary | ICD-10-CM

## 2023-01-30 DIAGNOSIS — M47812 Spondylosis without myelopathy or radiculopathy, cervical region: Secondary | ICD-10-CM

## 2023-01-30 NOTE — Telephone Encounter (Signed)
Spoke with patient and gave her results of MRI. She would like to move forward with cervical injection

## 2023-01-30 NOTE — Telephone Encounter (Signed)
Call to patient given appointment for 8:45 AM on Thursday 01/31/2023 with Dr. Rosaura Carpenter.

## 2023-01-30 NOTE — Telephone Encounter (Signed)
Attempted to call patient this morning to discuss recent cervical MRI imaging. No answer, did leave VM for her to call back. There is bilateral facet osteoarthritis with edematous change, worse on the left than the right at C6-C7. Would recommend bilateral cervical facet joint injections at this level. I will go ahead and place order so that we can get injections authorized.

## 2023-01-31 ENCOUNTER — Encounter: Payer: Medicaid Other | Admitting: Student

## 2023-01-31 NOTE — Telephone Encounter (Signed)
Patient unable to make it to her appointment today. I spoke with [name] on the phone. Patient's identity was confirmed using two patient specific identifiers. We discussed her knee pain. She has swelling of the knee and big toe which is recurrent. Denies, fever, chills, night sweats, or erythema. Likely gout but cannot rule out septic joint. I recommended that the patient go to urgent care or the ED in order to make sure she does not have an infected knee joint as that would require emergent attention. Patient demonstrated understanding of the plan.

## 2023-02-04 NOTE — Progress Notes (Signed)
This patient is here to enroll in AA Heart, a registry study measuring Lpa.  Jean [name] sees Dr. Lynnette Caffey for palpitations, ;hypertension, hyperlipidemia, and CKD stage 3.  Currently stable.  Jean [name] has prior echo which shows some aortic valve calcification and minimal reduced AVA.  Jean [name] did have a stroke in 2022 and was seen by Dr. Katrinka Blazing.     Subject met inclusion and exclusion criteria.  The informed consent form, study requirements and expectations were reviewed with the subject and questions and concerns were addressed prior to the signing of the consent form.  The subject verbalized understanding of the trial requirements.  The subject agreed to participate in the AA HEART  trial and signed the informed consent at 10:40 AM on 01-11-2023 AM.  The informed consent was obtained prior to performance of any protocol-specific procedures for the subject.  A copy of the signed informed consent was given to the subject and a copy was placed in the subject's medical record.    Alert, oriented in NAD  BP 144/94  BP Location Left Arm  Pulse 88  Resp 16  Temp 97.3 F (36.3 C)  SpO2 98 %  Weight 131 lb 13.4 oz (59.8 kg)  Height 5\' 4"  (1.626 m)  Lungs clear to auscultation and percussion.  Cor reg with soft 2/6 SEM. No DM Abd   no masses Ext no edema   Patient agreeable for AA Heart, and lab work obtained.   Arturo Morton. Riley Kill, MD Medical Director, [facility]

## 2023-02-17 ENCOUNTER — Encounter (HOSPITAL_COMMUNITY): Payer: Self-pay | Admitting: *Deleted

## 2023-02-17 ENCOUNTER — Encounter (HOSPITAL_COMMUNITY): Payer: Self-pay

## 2023-02-17 ENCOUNTER — Ambulatory Visit (HOSPITAL_COMMUNITY)
Admission: EM | Admit: 2023-02-17 | Discharge: 2023-02-17 | Disposition: A | Discharge status: Home / Self Care | Payer: Medicaid Other

## 2023-02-17 ENCOUNTER — Other Ambulatory Visit: Payer: Self-pay

## 2023-02-17 ENCOUNTER — Emergency Department (HOSPITAL_COMMUNITY)
Admission: EM | Admit: 2023-02-17 | Discharge: 2023-02-17 | Disposition: A | Discharge status: Home / Self Care | Payer: Medicaid Other | Attending: Emergency Medicine | Admitting: Emergency Medicine

## 2023-02-17 DIAGNOSIS — I129 Hypertensive chronic kidney disease with stage 1 through stage 4 chronic kidney disease, or unspecified chronic kidney disease: Secondary | ICD-10-CM | POA: Diagnosis not present

## 2023-02-17 DIAGNOSIS — M25461 Effusion, right knee: Secondary | ICD-10-CM | POA: Diagnosis not present

## 2023-02-17 DIAGNOSIS — M25561 Pain in right knee: Secondary | ICD-10-CM

## 2023-02-17 DIAGNOSIS — N189 Chronic kidney disease, unspecified: Secondary | ICD-10-CM | POA: Insufficient documentation

## 2023-02-17 DIAGNOSIS — Z79899 Other long term (current) drug therapy: Secondary | ICD-10-CM | POA: Diagnosis not present

## 2023-02-17 DIAGNOSIS — Z7982 Long term (current) use of aspirin: Secondary | ICD-10-CM | POA: Insufficient documentation

## 2023-02-17 LAB — SYNOVIAL CELL COUNT + DIFF, W/ CRYSTALS
Crystals, Fluid: NONE SEEN
Lymphocytes-Synovial Fld: 78 % — ABNORMAL HIGH (ref 0–20)
Monocyte-Macrophage-Synovial Fluid: 20 % — ABNORMAL LOW (ref 50–90)
Neutrophil, Synovial: 2 % (ref 0–25)
WBC, Synovial: 285 /mm3 — ABNORMAL HIGH (ref 0–200)

## 2023-02-17 MED ORDER — LIDOCAINE-EPINEPHRINE (PF) 2 %-1:200000 IJ SOLN
20.0000 mL | Freq: Once | INTRAMUSCULAR | Status: DC
  Filled 2023-02-17: qty 20

## 2023-02-17 NOTE — Discharge Instructions (Signed)
Jean [name]:  Thank you for allowing [place] to take care of you today.  We hope you begin feeling better soon. You were seen today for right-sided knee pain and swelling.  There is no sign of infection.  To-Do: Please follow-up with your primary doctor to schedule an appointment with a new primary care doctor within the next 2-3 days. Please keep the knee wrapped [place] support in the coming days  Please return to the Emergency Department or call 911 if you experience chest pain, shortness of breath, severe pain, severe fever, altered mental status, or have any reason to think that you need emergency medical care.  Thank you again.  Hope you feel better soon.

## 2023-02-17 NOTE — ED Triage Notes (Signed)
Pt arrived POV from urgent care c/o right knee pain and right great toe pain. Pt states her knee has been hurting and swelling for a couple days and is a little warm to touch. Pt also states her big toe is painful and swollen. Pt has had to have fluid drawn off her knee in the past.

## 2023-02-17 NOTE — ED Provider Notes (Signed)
[place] EMERGENCY DEPARTMENT AT [facility] Provider Note   CSN: [id number] Arrival date & time: 02/17/23  1658     History  Chief Complaint  Patient presents with   Knee Pain    Jean [name] is a 57 y.o. female.   Knee Pain 57 year old female with past medical history of hypertension, CKD presented for evaluation of right knee swelling.  Patient states that she has had chronic swelling of his right knee, which is worsened in recent weeks.  She describes a feeling of warmth in addition to pressure that limits her range of motion.  It is quite painful for her.  She also notes swelling of the right great toe in recent weeks.  Patient denies any traumatic injury.  No recent fevers, cough, congestion, shortness of breath, abdominal pain.  She says that her right knee was last tapped approximately 1 year ago.  Has never had infection within the knee.    Home Medications Prior to Admission medications   Medication Sig Start Date End Date Taking? Authorizing Provider  acetaminophen (TYLENOL) 500 MG tablet Take 500 mg by mouth every 6 (six) hours as needed for moderate pain.    [provider]  amLODipine (NORVASC) 10 MG tablet Take 1 tablet (10 mg total) by mouth daily. 11/21/22 02/19/23  Sharlene Dory, PA-C  aspirin 81 MG chewable tablet Chew 1 tablet (81 mg total) by mouth daily. 11/21/22   Sharlene Dory, PA-C  atorvastatin (LIPITOR) 40 MG tablet Take 1 tablet (40 mg total) by mouth daily. 11/21/22 02/19/23  Sharlene Dory, PA-C  cyclobenzaprine (FLEXERIL) 10 MG tablet TAKE 1 TABLET BY MOUTH TWICE A DAY AS NEEDED FOR MUSCLE SPASMS 11/29/22   [name], DO  famotidine (PEPCID) 20 MG tablet TAKE 1 TABLET BY MOUTH TWICE A DAY 01/09/23   [name], DO  gabapentin (NEURONTIN) 300 MG capsule TAKE 1 CAPSULE BY MOUTH EVERYDAY AT BEDTIME 12/11/22   [name], DO  metoprolol tartrate (LOPRESSOR) 25 MG tablet Take 0.5 tablets (12.5 mg total) by  mouth 2 (two) times daily. 11/21/22 02/19/23  Sharlene Dory, PA-C  pantoprazole (PROTONIX) 40 MG tablet Take 1 tablet (40 mg total) by mouth daily. 12/11/22   [name], DO  sertraline (ZOLOFT) 100 MG tablet TAKE 1 TABLET BY MOUTH EVERY DAY 02/14/21   Verdene Lennert, MD  spironolactone (ALDACTONE) 25 MG tablet Take 1 tablet (25 mg total) by mouth daily. 07/18/22   Nahser, Deloris Ping, MD  tetrahydrozoline 0.05 % ophthalmic solution Place 1 drop into both eyes daily as needed (for dry eyes).     [provider]  VENTOLIN HFA 108 (90 Base) MCG/ACT inhaler INHALE 2 PUFFS INTO THE LUNGS EVERY 6 (SIX) HOURS AS NEEDED FOR WHEEZING OR SHORTNESS OF BREATH (COUGHING). 10/10/21   Verdene Lennert, MD      Allergies    Lisinopril    Review of Systems   Review of Systems  Physical Exam Updated Vital Signs BP (!) 161/83   Pulse 63   Temp 98.2 F (36.8 C) (Oral)   Resp 15   Ht 5\' 4"  (1.626 m)   Wt 61.7 kg   LMP  (LMP Unknown)   SpO2 100%   BMI 23.34 kg/m  Physical Exam HENT:     Head: Normocephalic.     Right Ear: External ear normal.     Left Ear: External ear normal.     Nose: Nose normal.     Mouth/Throat:  Mouth: Mucous membranes are moist.     Pharynx: No oropharyngeal exudate.  Eyes:     Extraocular Movements: Extraocular movements intact.     Conjunctiva/sclera: Conjunctivae normal.  Cardiovascular:     Rate and Rhythm: Normal rate and regular rhythm.     Pulses: Normal pulses.  Pulmonary:     Effort: Pulmonary effort is normal. No respiratory distress.  Abdominal:     General: Abdomen is flat.     Palpations: Abdomen is soft.     Tenderness: There is no abdominal tenderness. There is no guarding or rebound.  Musculoskeletal:        General: Swelling and tenderness present.     Comments: Edema surrounding right knee with appreciable warmth.  No obvious skin changes with erythema.  Right great toe appears symmetric with the left great toe.  No appreciable  edema, erythema  Skin:    General: Skin is warm and dry.     Findings: No rash.  Neurological:     General: No focal deficit present.     Mental Status: She is alert.     Sensory: No sensory deficit.     Motor: No weakness.     ED Results / Procedures / Treatments   Labs (all labs ordered are listed, but only abnormal results are displayed) Labs Reviewed  SYNOVIAL CELL COUNT + DIFF, W/ CRYSTALS - Abnormal; Notable for the following components:      Result Value   Color, Synovial YELLOW (*)    Appearance-Synovial HAZY (*)    WBC, Synovial 285 (*)    Lymphocytes-Synovial Fld 78 (*)    Monocyte-Macrophage-Synovial Fluid 20 (*)    All other components within normal limits  BODY FLUID CULTURE W GRAM STAIN  GLUCOSE, BODY FLUID OTHER            PROTEIN, BODY FLUID (OTHER)  URIC ACID, BODY FLUID    EKG None  Radiology No results found.  Procedures .Joint Aspiration/Arthrocentesis  Date/Time: 02/17/2023 7:10 PM  Performed by: Lyman Speller, MD Authorized by: Charlynne Pander, MD   Consent:    Consent obtained:  Verbal   Consent given by:  Patient   Risks, benefits, and alternatives were discussed: yes     Risks discussed:  Bleeding, incomplete drainage, infection and nerve damage   Alternatives discussed:  No treatment Universal protocol:    Site/side marked: yes     Immediately prior to procedure, a time out was called: yes     Patient identity confirmed:  Verbally with patient Location:    Location:  Knee   Knee:  R knee Anesthesia:    Anesthesia method:  Local infiltration   Local anesthetic:  Lidocaine 2% WITH epi Procedure details:    Preparation: Patient was prepped and draped in usual sterile fashion     Needle gauge:  18 G   Ultrasound guidance: yes     Approach:  Lateral   Aspirate amount:  30   Aspirate characteristics:  Yellow   Steroid injected: no   Post-procedure details:    Dressing:  Adhesive bandage and gauze roll   Procedure completion:   Tolerated     Medications Ordered in ED Medications  lidocaine-EPINEPHrine (XYLOCAINE W/EPI) 2 %-1:200000 (PF) injection 20 mL (has no administration in time range)    ED Course/ Medical Decision Making/ A&P  Medical Decision Making Amount and/or Complexity of Data Reviewed Labs: ordered.  Risk Prescription drug management.   KHARISMA GLASNER is a 57 y.o. who presents to the emergency department for right knee pain.   On initial exam, vital signs within normal limits.  Patient is in no acute distress, neurovascularly intact.  There is mild edema and warmth of the right knee, with tenderness to palpation.   Ddx: synovitis, septic arthritis. fracture, dislocation, ligamentous injury.  There is no evidence of compartment syndrome, including no pallor, pulselessness, paresthesias, or paralysis.  There are no signs of neurovascular compromise as distal pulses, sensation, and motor function intact.   No signs of DVT including no leg swelling or pain, no swelling or tenderness of the popliteal fossa, no diminished pulses in the lower extremity or foot.  No dyspnea or decreased SpO2, no prior DVT/PE.  There is no evidence of other injury on exam, including no headache or skull tenderness, no neck tenderness, no abdominal tenderness or pain, no back pain or tenderness, no other musculoskeletal injuries noted.    Given patient's reduced range of motion, edema, and warmth over the right knee joint, there is concern for septic joint.  Arthrocentesis completed at bedside.  See procedure note above for details.  Fluid studies showed no organisms on Gram stain and just a few WBC.  Synovial fluid showed a white blood cell count of 285, well below infectious levels.  As for her great toe pain, I do not appreciate any swelling, erythema, warmth.  I have low suspicion for gout, septic joint, fracture, or other acute pathology.    ED course: Patient reports complete  resolution of symptoms following arthrocentesis.  Physical exam shows reduced swelling and improved range of motion of the right knee.  Knee was wrapped at bedside to help with stabilization and prevent recurrence of effusion.  Patient is safe for discharge home at this time.  They were given strict return precautions.  They voiced understanding of and agreement with this plan.   I saw this patient in conjunction with my attending, Dr. Silverio Lay.  Final Clinical Impression(s) / ED Diagnoses Final diagnoses:  Acute pain of right knee    Rx / DC Orders ED Discharge Orders     None         Lyman Speller, MD 02/17/23 2323    Charlynne Pander, MD 02/18/23 2250

## 2023-02-17 NOTE — ED Triage Notes (Signed)
Pt reports Rt kneepain and Rt great toe pain with out injury.

## 2023-02-17 NOTE — Discharge Instructions (Signed)
Please seek further evaluation in the ER.

## 2023-02-17 NOTE — ED Provider Notes (Signed)
Patient presents for right knee pain and swelling and right great toe pain. Denies any known injury. History of arthritis. Upon assessment right knee is significantly swollen, erythematous, tender, and warm to touch.  Patient also has moderate tenderness to right great toe and in first MP joint.  Patient has history of synovitis in left knee requiring fluid drainage.  Based on history and presentation, I believe patient is needing further evaluation in the ER with imaging and blood work to rule out septic arthritis or joint effusion requiring drainage.  Patient agreeable to plan at this time.  Patient is stable enough at this time to arrived to ER via POV.   Wynonia Lawman A, NP 02/17/23 [phone]

## 2023-02-19 LAB — PROTEIN, BODY FLUID (OTHER): Total Protein, Body Fluid Other: 4 g/dL

## 2023-02-19 LAB — URIC ACID, BODY FLUID: Uric Acid Body Fluid: 6 mg/dL

## 2023-02-19 LAB — GLUCOSE, BODY FLUID OTHER: Glucose, Body Fluid Other: 96 mg/dL

## 2023-02-21 LAB — BODY FLUID CULTURE W GRAM STAIN: Culture: NO GROWTH

## 2023-02-27 ENCOUNTER — Other Ambulatory Visit: Payer: Self-pay | Admitting: Radiology

## 2023-02-27 ENCOUNTER — Ambulatory Visit (INDEPENDENT_AMBULATORY_CARE_PROVIDER_SITE_OTHER): Payer: Medicaid Other | Admitting: Orthopaedic Surgery

## 2023-02-27 ENCOUNTER — Other Ambulatory Visit (INDEPENDENT_AMBULATORY_CARE_PROVIDER_SITE_OTHER): Payer: Self-pay

## 2023-02-27 DIAGNOSIS — M542 Cervicalgia: Secondary | ICD-10-CM

## 2023-02-27 DIAGNOSIS — M25461 Effusion, right knee: Secondary | ICD-10-CM

## 2023-02-27 DIAGNOSIS — M25561 Pain in right knee: Secondary | ICD-10-CM

## 2023-02-27 DIAGNOSIS — M25559 Pain in unspecified hip: Secondary | ICD-10-CM

## 2023-02-27 DIAGNOSIS — M62838 Other muscle spasm: Secondary | ICD-10-CM | POA: Diagnosis not present

## 2023-02-27 DIAGNOSIS — M5412 Radiculopathy, cervical region: Secondary | ICD-10-CM

## 2023-02-27 MED ORDER — GABAPENTIN 300 MG PO CAPS: ORAL_CAPSULE | ORAL | 1 refills | Status: DC

## 2023-02-27 MED ORDER — LIDOCAINE HCL 1 % IJ SOLN
3.0000 mL | INTRAMUSCULAR | Status: AC | PRN
  Administered 2023-02-27: 3 mL

## 2023-02-27 MED ORDER — CYCLOBENZAPRINE HCL 10 MG PO TABS: ORAL_TABLET | ORAL | 3 refills | Status: DC

## 2023-02-27 MED ORDER — METHYLPREDNISOLONE ACETATE 40 MG/ML IJ SUSP
40.0000 mg | INTRAMUSCULAR | Status: AC | PRN
  Administered 2023-02-27: 40 mg via INTRA_ARTICULAR

## 2023-02-27 NOTE — Progress Notes (Signed)
The patient comes in today with acute right knee pain and swelling.  She went to the emergency room recently and they did drain a lot of fluid from her knee.  She does ambulate using a cane.  She is a very thin individual.  She has denies any injury to that knee.  On my exam today she has a very large effusion again with her right knee.  It is painful.  It is warm.  There is no redness.  Her knee feels stable ligamentously but it is really hard to examine given her pain and effusion.  2 views of the right knee today show only an effusion.  There is no malalignment and no gross deformities at all.  The joint spaces well-maintained.  I see no evidence of fracture.  She has had a recent MRI of her cervical spine by Dr. Alvester Morin send her for.  That MRI shows significant edema in the facet joints of her cervical spine especially at C6-C7.  I would like to send her to Dr. Alvester Morin to see if he would consider facet joint injections at her lower cervical spine given the symptoms that she continues to have combined with her MRI findings.  I was able to aspirate 40 cc of clear fluid from her knee that gave her immediate relief and then I placed a steroid injection in her knee.  I would like to see her back in 4 weeks to see how she is done from my injection from her knee and to see if Dr. Alvester Morin has been able to give her any relief from an injection in her neck.  I will refill her gabapentin and her Flexeril in the interim as well.    Procedure Note  Patient: Jean [name]             Date of Birth: Nov 06, 1965           MRN: [id number]             Visit Date: 02/27/2023  Procedures: Visit Diagnoses:  1. Acute pain of right knee   2. Effusion, right knee     Large Joint Inj: R knee on 02/27/2023 4:15 PM Indications: diagnostic evaluation and pain Details: 22 G 1.5 in needle, superolateral approach  Arthrogram: No  Medications: 3 mL lidocaine 1 %; 40 mg methylPREDNISolone acetate 40 MG/ML Outcome:  tolerated well, no immediate complications Procedure, treatment alternatives, risks and benefits explained, specific risks discussed. Consent was given by the patient. Immediately prior to procedure a time out was called to verify the correct patient, procedure, equipment, support staff and site/side marked as required. Patient was prepped and draped in the usual sterile fashion.

## 2023-03-11 ENCOUNTER — Other Ambulatory Visit: Payer: Self-pay

## 2023-03-11 ENCOUNTER — Ambulatory Visit (INDEPENDENT_AMBULATORY_CARE_PROVIDER_SITE_OTHER): Payer: Medicaid Other | Admitting: Physical Medicine and Rehabilitation

## 2023-03-11 DIAGNOSIS — M47812 Spondylosis without myelopathy or radiculopathy, cervical region: Secondary | ICD-10-CM

## 2023-03-11 DIAGNOSIS — M542 Cervicalgia: Secondary | ICD-10-CM | POA: Diagnosis not present

## 2023-03-11 MED ORDER — METHYLPREDNISOLONE ACETATE 40 MG/ML IJ SUSP
40.0000 mg | Freq: Once | INTRAMUSCULAR | Status: AC
  Administered 2023-03-11: 40 mg

## 2023-03-11 NOTE — Patient Instructions (Signed)

## 2023-03-11 NOTE — Progress Notes (Signed)
Functional Pain Scale - descriptive words and definitions  Immobilizing (10)   Unable to move or talk due to intensity of pain/unable to sleep and unable to use distraction. Severe range order  Average Pain 10  139/92 +Driver, -BT, -Dye Allergies.

## 2023-03-11 NOTE — Progress Notes (Signed)
Jean [name] - 57 y.o. female MRN [id number]  Date of birth: 1965-06-12  Office Visit Note: Visit Date: 03/11/2023 PCP: [name], DO Referred by: [name], DO  Subjective: Chief Complaint  Patient presents with   Neck - Pain   HPI:  Jean [name] is a 57 y.o. female who comes in today at the request of Ellin Goodie, FNP for planned Bilateral  C6-7 Cervical facet/medial branch block with fluoroscopic guidance.  The patient has failed conservative care including home exercise, medications, time and activity modification.  This injection will be diagnostic and hopefully therapeutic.  Please see requesting physician notes for further details and justification.  Exam has shown concordant pain with facet joint loading.   ROS Otherwise per HPI.  Assessment & Plan: Visit Diagnoses:    ICD-10-CM   1. Cervicalgia  M54.2 XR C-ARM NO REPORT    Facet Injection    methylPREDNISolone acetate (DEPO-MEDROL) injection 40 mg    2. Cervical spondylosis without myelopathy  M47.812 XR C-ARM NO REPORT    Facet Injection    methylPREDNISolone acetate (DEPO-MEDROL) injection 40 mg      Plan: No additional findings.   Meds & Orders:  Meds ordered this encounter  Medications   methylPREDNISolone acetate (DEPO-MEDROL) injection 40 mg    Orders Placed This Encounter  Procedures   Facet Injection   XR C-ARM NO REPORT    Follow-up: Return for visit to requesting provider as needed.   Procedures: No procedures performed  Cervical Facet Joint Intra-Articular Injection with Fluoroscopic Guidance  Patient: Jean [name]      Date of Birth: [birth year] MRN: [id number] PCP: [name], DO      Visit Date: 03/11/2023   Universal Protocol:    Date/Time: 11/04/241:37 PM  Consent Given By: the patient  Position: PRONE  Additional Comments: Vital signs were monitored before and after the procedure. Patient was prepped and draped in the usual sterile  fashion. The correct patient, procedure, and site was verified.   Injection Procedure Details:  Procedure Site One Meds Administered:  Meds ordered this encounter  Medications   methylPREDNISolone acetate (DEPO-MEDROL) injection 40 mg     Laterality: Bilateral  Location/Site:  C6-7  Needle size: 25 G  Needle type: Spinal  Needle Placement: Articular  Findings:  -Contrast Used: 0.5 mL iohexol 180 mg iodine/mL   -Comments: Excellent flow of contrast producing a partial arthrogram.  Procedure Details: The region overlying the facet joints mentioned above were localized under fluoroscopic visualization. The needle was inserted down to the level of the lateral mass of the superior articular process of the facet joint to be injected. Then, the needle was "walked off" inferiorly into the lateral aspect of the facet joint. Bi-planar images were used for confirming placement and spot radiographs were documented.  A 0.25 ml volume of Omnipaque-240 was injected into the facet joint and a standard partial arthrogram was obtained. Radiographs were obtained of the arthrogram. A 0.5 ml. volume of the steroid/anesthetic solution was injected into the joint. This procedure was repeated for each facet joint injected.   Additional Comments:  No complications occurred Dressing: Band-Aid    Post-procedure details: Patient was observed during the procedure. Post-procedure instructions were reviewed.  Patient left the clinic in stable condition.   Clinical History: CLINICAL DATA:  Chronic neck pain noted over the last several years but worsening over the last 7 months.   EXAM: MRI CERVICAL SPINE WITHOUT CONTRAST   TECHNIQUE: Multiplanar, multisequence  MR imaging of the cervical spine was performed. No intravenous contrast was administered.   COMPARISON:  MRI 03/21/2022   FINDINGS: Alignment: No malalignment.   Vertebrae: No vertebral body abnormality.   Cord: No cord compression  or focal cord lesion.   Posterior Fossa, vertebral arteries, paraspinal tissues: Posterior fossa unremarkable. Chronic prominent adenoidal tissue.   Disc levels:   Foramen magnum, C1-2 and C2-3 are normal.   C3-4: Mild bulging of the disc. Facet osteoarthritis on both sides with mild bilateral bony foraminal narrowing.   C4-5: Mild bulging of the disc. Facet osteoarthritis on both sides with mild bilateral bony foraminal narrowing.   C5-6: Mild bulging of the disc. Facet osteoarthritis on the left with mild left foraminal narrowing.   C6-7: Mild bulging of the disc. Bilateral facet osteoarthritis with edematous change, worse on the left than the right. This would likely relate to neck pain. There is bilateral foraminal narrowing, left more than right.   C7-T1: No disc abnormality. Chronic facet osteoarthritis. Mild bilateral bony foraminal narrowing.   IMPRESSION: 1. C6-7: Bilateral facet osteoarthritis with edematous change, worse on the left than the right. This would likely relate to neck pain. Bilateral foraminal narrowing, left more than right. The edematous facet changes have worsened since the prior study and may be the single greatest cause of the recently worsened symptoms. 2. C3-4, C4-5 and C5-6: Facet osteoarthritis with mild bony foraminal narrowing. 3. C7-T1: Facet osteoarthritis with mild bilateral bony foraminal narrowing.     Electronically Signed   By: Paulina Fusi M.D.   On: 01/29/2023 15:53     Objective:  VS:  HT:    WT:   BMI:     BP:   HR: bpm  TEMP: ( )  RESP:  Physical Exam Vitals and nursing note reviewed.  Constitutional:      General: She is not in acute distress.    Appearance: Normal appearance. She is not ill-appearing.  HENT:     Head: Normocephalic and atraumatic.     Right Ear: External ear normal.     Left Ear: External ear normal.  Eyes:     Extraocular Movements: Extraocular movements intact.  Cardiovascular:     Rate  and Rhythm: Normal rate.     Pulses: Normal pulses.  Musculoskeletal:     Cervical back: Tenderness present. No rigidity.     Right lower leg: No edema.     Left lower leg: No edema.     Comments: Patient has good strength in the upper extremities including 5 out of 5 strength in wrist extension long finger flexion and APB.  There is no atrophy of the hands intrinsically.  There is a negative Hoffmann's test.   Lymphadenopathy:     Cervical: No cervical adenopathy.  Skin:    Findings: No erythema, lesion or rash.  Neurological:     General: No focal deficit present.     Mental Status: She is alert and oriented to person, place, and time.     Sensory: No sensory deficit.     Motor: No weakness or abnormal muscle tone.     Coordination: Coordination normal.  Psychiatric:        Mood and Affect: Mood normal.        Behavior: Behavior normal.      Imaging: No results found.

## 2023-03-11 NOTE — Procedures (Signed)
Cervical Facet Joint Intra-Articular Injection with Fluoroscopic Guidance  Patient: Jean [name]      Date of Birth: December 28, 1965 MRN: [id number] PCP: [name], DO      Visit Date: 03/11/2023   Universal Protocol:    Date/Time: 11/04/241:37 PM  Consent Given By: the patient  Position: PRONE  Additional Comments: Vital signs were monitored before and after the procedure. Patient was prepped and draped in the usual sterile fashion. The correct patient, procedure, and site was verified.   Injection Procedure Details:  Procedure Site One Meds Administered:  Meds ordered this encounter  Medications   methylPREDNISolone acetate (DEPO-MEDROL) injection 40 mg     Laterality: Bilateral  Location/Site:  C6-7  Needle size: 25 G  Needle type: Spinal  Needle Placement: Articular  Findings:  -Contrast Used: 0.5 mL iohexol 180 mg iodine/mL   -Comments: Excellent flow of contrast producing a partial arthrogram.  Procedure Details: The region overlying the facet joints mentioned above were localized under fluoroscopic visualization. The needle was inserted down to the level of the lateral mass of the superior articular process of the facet joint to be injected. Then, the needle was "walked off" inferiorly into the lateral aspect of the facet joint. Bi-planar images were used for confirming placement and spot radiographs were documented.  A 0.25 ml volume of Omnipaque-240 was injected into the facet joint and a standard partial arthrogram was obtained. Radiographs were obtained of the arthrogram. A 0.5 ml. volume of the steroid/anesthetic solution was injected into the joint. This procedure was repeated for each facet joint injected.   Additional Comments:  No complications occurred Dressing: Band-Aid    Post-procedure details: Patient was observed during the procedure. Post-procedure instructions were reviewed.  Patient left the clinic in stable condition.

## 2023-03-27 ENCOUNTER — Ambulatory Visit: Payer: Medicaid Other | Admitting: Orthopaedic Surgery

## 2023-03-30 ENCOUNTER — Ambulatory Visit: Payer: Medicaid Other

## 2023-04-08 ENCOUNTER — Telehealth: Payer: Self-pay | Admitting: *Deleted

## 2023-04-08 ENCOUNTER — Encounter: Payer: Self-pay | Admitting: Physician Assistant

## 2023-04-08 ENCOUNTER — Ambulatory Visit (INDEPENDENT_AMBULATORY_CARE_PROVIDER_SITE_OTHER): Payer: Medicaid Other | Admitting: Physician Assistant

## 2023-04-08 DIAGNOSIS — M25561 Pain in right knee: Secondary | ICD-10-CM

## 2023-04-08 NOTE — Telephone Encounter (Signed)
Call from patient went to the Orthopaedic doctor and had 30 cc of fluid drawn form her knee recently.  Pain has returned as well as swelling.  No available appointments in the Clinics.  Patient was advised to call the Orthopaedic doctors office to see if she can be seen today or go to Urgent Care.

## 2023-04-08 NOTE — Progress Notes (Signed)
HPI: Mrs. Warning returns today due to left knee pain and swelling.  She was last seen by Dr. Magnus Ivan on 02/27/2023.  States the knee continues to swell.  Also knee is giving way and she is having painful popping in the knee.  She does not feel the aspiration and injection really helped with her knee pain.  She has been doubling up on her gabapentin at night to help sleep.  She is also taking muscle relaxer.  She is now ambulating with a rollator.  States the Tylenol no longer is helping with her knee pain.  She has had no new injury to the knee.  Review of systems: See HPI otherwise negative or noncontributory.  Physical exam: General Well-developed well-nourished female no acute distress mood affect appropriate. Psych: Alert and oriented x 3 Right knee full extension flexion to 90 degrees any attempts of forcing this past 90 degrees causes significant pain.  Positive effusion no abnormal warmth erythema.  Tenderness on the lateral joint line.  No instability valgus varus stressing.  Impression: Acute knee pain with recurrent effusion  Plan: Given the fact that she has recurrent effusion knee got no relief with a cortisone injection and also radiographs that showed no significant arthritis or malalignment of the right knee.  Recommend MRI right knee rule out meniscal tear.  Follow-up after the MRI to go over results and discuss further treatment.

## 2023-04-09 ENCOUNTER — Other Ambulatory Visit: Payer: Self-pay | Admitting: Radiology

## 2023-04-09 DIAGNOSIS — M25561 Pain in right knee: Secondary | ICD-10-CM

## 2023-04-17 ENCOUNTER — Ambulatory Visit: Payer: Medicaid Other | Admitting: Orthopaedic Surgery

## 2023-04-23 ENCOUNTER — Telehealth: Payer: Self-pay | Admitting: Orthopaedic Surgery

## 2023-04-23 ENCOUNTER — Other Ambulatory Visit: Payer: Self-pay | Admitting: Orthopaedic Surgery

## 2023-04-23 ENCOUNTER — Encounter: Payer: Self-pay | Admitting: Pulmonary Disease

## 2023-04-23 ENCOUNTER — Telehealth: Payer: Self-pay | Admitting: Physician Assistant

## 2023-04-23 ENCOUNTER — Ambulatory Visit: Payer: Medicaid Other | Admitting: Pulmonary Disease

## 2023-04-23 DIAGNOSIS — J4540 Moderate persistent asthma, uncomplicated: Secondary | ICD-10-CM | POA: Diagnosis not present

## 2023-04-23 DIAGNOSIS — J449 Chronic obstructive pulmonary disease, unspecified: Secondary | ICD-10-CM

## 2023-04-23 DIAGNOSIS — M62838 Other muscle spasm: Secondary | ICD-10-CM

## 2023-04-23 DIAGNOSIS — M25559 Pain in unspecified hip: Secondary | ICD-10-CM

## 2023-04-23 MED ORDER — ALBUTEROL SULFATE HFA 108 (90 BASE) MCG/ACT IN AERS: 2.0000 | INHALATION_SPRAY | RESPIRATORY_TRACT | 1 refills | Status: DC | PRN

## 2023-04-23 MED ORDER — GABAPENTIN 300 MG PO CAPS: ORAL_CAPSULE | ORAL | 1 refills | Status: DC

## 2023-04-23 MED ORDER — BREZTRI AEROSPHERE 160-9-4.8 MCG/ACT IN AERO: 2.0000 | INHALATION_SPRAY | Freq: Two times a day (BID) | RESPIRATORY_TRACT | Status: DC

## 2023-04-23 MED ORDER — BUDESONIDE-FORMOTEROL FUMARATE 160-4.5 MCG/ACT IN AERO: 2.0000 | INHALATION_SPRAY | Freq: Two times a day (BID) | RESPIRATORY_TRACT | 12 refills | Status: DC

## 2023-04-23 MED ORDER — CYCLOBENZAPRINE HCL 10 MG PO TABS: ORAL_TABLET | ORAL | 3 refills | Status: DC

## 2023-04-23 NOTE — Patient Instructions (Addendum)
It is nice to meet you  Use Symbicort 2 puffs twice a day every day, rinse your mouth out with water after every use  Use albuterol 2 puffs at 3 4 hours as needed for shortness of breath and wheeze  I sent new prescriptions for both of these medications today  We may need to add additional inhalers in the future if the breathing is not well-controlled  Your pulmonary function test in the past are most consistent with asthma.  This is what were targeting and treating with the inhalers.  Return to clinic in 3 months or sooner as needed with Dr. Judeth Horn dyspnea on exertion

## 2023-04-23 NOTE — Telephone Encounter (Signed)
Patient called and needs a refill on muscle relaxers  and gabapentin and her Albuterol inhaler. [phone]

## 2023-04-23 NOTE — Progress Notes (Signed)
@Patient  ID: Jean [name], female    DOB: June 13, 1965, 56 y.o.   MRN: [id number]  Chief Complaint  Patient presents with   Pulmonary Consult    Referred by Dr. Ninfa Meeker for eval of COPD. She states she was dxed with COPD about 3 years ago. She has DOE with walking short distances. She has occ cough with yellow to clear sputum. She has some difficulty with swallowing.     Referring provider: [name], DO  HPI:   57 y.o. woman whom are seen for evaluation of dyspnea exertion.  Multiple notes from PCP office reviewed.  Multiple prior cardiology notes reviewed.  She reports long history of shortness of breath.  They do back to childhood and adolescence and early adulthood.  Has been on various inhalers in the past.  Has been without main inhalers at least for the last several months.  Has been without albuterol rescue Hailer for about 2 months.  Progressive dyspnea.  Hard to breathe.  PFT 2020 fully reviewed and interpreted below but this is consistent with asthma.  Chest chest x-ray most recently 04/2021 that is clear with hyperinflation.  We discussed at length the role and rationale for inhaler therapy for asthma.  She is a former smoker.  Quit 4 years ago.  Roughly 18-pack-year history half a pack for 35 years.  In the past has been positive for cocaine on toxicology 2019.  We discussed cocaine could cause worsening asthma symptoms with bronchospasm etc.  She reports strict abstinence from cocaine today.    Questionaires / Pulmonary Flowsheets:   ACT:      No data to display          MMRC:     No data to display          Epworth:      No data to display          Tests:   FENO:  No results found for: "NITRICOXIDE"  PFT:    Latest Ref Rng & Units 07/04/2018    8:55 AM  PFT Results  FVC-Pre L 2.06   FVC-Predicted Pre % 73   FVC-Post L 2.48   FVC-Predicted Post % 88   Pre FEV1/FVC % % 97   Post FEV1/FCV % % 84   FEV1-Pre L 2.00    FEV1-Predicted Pre % 90   FEV1-Post L 2.08   DLCO uncorrected ml/min/mmHg 16.11   DLCO UNC% % 78   DLVA Predicted % 93   TLC L 4.49   TLC % Predicted % 88   RV % Predicted % 110   Personally viewed and interpreted as no fixed obstruction.  Significant bronchodilator response noted in FVC.  Lung volumes consistent with air trapping.  DLCO within normal limits.  WALK:      No data to display          Imaging: Personally reviewed and as per EMR and discussion in this note No results found.  Lab Results: Personally reviewed CBC    Component Value Date/Time   WBC 6.7 04/20/2021 0257   RBC 4.11 04/20/2021 0257   HGB 12.9 04/20/2021 0257   HGB 13.1 02/10/2018 1203   HCT 39.4 04/20/2021 0257   HCT 39.9 02/10/2018 1203   PLT 361 04/20/2021 0257   PLT 419 02/10/2018 1203   MCV 95.9 04/20/2021 0257   MCV 97 02/10/2018 1203   MCH 31.4 04/20/2021 0257   MCHC 32.7 04/20/2021 0257   RDW 12.7 04/20/2021 0257  RDW 13.7 02/10/2018 1203   LYMPHSABS 3.1 04/19/2021 0352   LYMPHSABS 2.8 02/10/2018 1203   MONOABS 0.4 04/19/2021 0352   EOSABS 0.0 04/19/2021 0352   EOSABS 0.2 02/10/2018 1203   BASOSABS 0.0 04/19/2021 0352   BASOSABS 0.0 02/10/2018 1203    BMET    Component Value Date/Time   NA 141 11/29/2022 1644   K 4.5 11/29/2022 1644   CL 105 11/29/2022 1644   CO2 22 11/29/2022 1644   GLUCOSE 77 11/29/2022 1644   GLUCOSE 117 (H) 04/21/2021 0125   BUN 26 (H) 11/29/2022 1644   CREATININE 1.44 (H) 11/29/2022 1644   CALCIUM 10.2 11/29/2022 1644   GFRNONAA 45 (L) 04/21/2021 0125   GFRAA 55 (L) 05/27/2019 1656    BNP No results found for: "BNP"  ProBNP    Component Value Date/Time   PROBNP 55.0 10/29/2018 1048    Specialty Problems       Pulmonary Problems   Dyspnea on exertion   Onset 2016  - PFT's  10/29/2018  FEV1 2.08 (93 % ) ratio 0.84  p 0 % improvement from saba p ? prior to study with DLCO  78 % corrects to 93 % for alv volume and 20% improvement in FVC  p saba with incomplete exhalation Echo 11/11/18   1. The left ventricle has hyperdynamic systolic function, with an ejection fraction of >65%. The cavity size was normal. There is moderately increased left ventricular wall thickness. Left ventricular diastolic Doppler parameters are consistent with  impaired relaxation.  2. The right ventricle has normal systolic function. The cavity was normal. There is no increase in right ventricular wall thickness.  3. The aortic valve is tricuspid. Moderate calcification of the aortic valve with nodular calcification on the right coronary cusp. No stenosis of the aortic valve.  4. The aortic root and ascending aorta are normal in size and structure.  5. Normal IVC size. No complete TR doppler jet so unable to estimate PA systolic pressure.  6. No evidence of mitral valve stenosis. No significant mitral regurgitation.      COPD GOLD 0 ? AB    Quit smoking 11/2018 - PFT's  10/29/2018  FEV1 2.08 (93 % ) ratio 0.84  p 0 % improvement from saba p ? prior to study with DLCO  78 % corrects to 93 % for alv volume and 20% improvement in FVC p saba with incomplete exhalation - 12/02/2018   try symbicort 80 2bid  - 01/22/2019  After extensive coaching inhaler device,  effectiveness =    75% from a baseline of 50%        Allergies  Allergen Reactions   Lisinopril Swelling and Other (See Comments)    Lip swelling, patient reported - unable to confirm from chart     Immunization History  Administered Date(s) Administered   Influenza, Seasonal, Injecte, Preservative Fre 03/30/2023   Influenza,inj,Quad PF,6+ Mos 07/08/2015, 05/17/2017, 02/04/2019   Pneumococcal Polysaccharide-23 11/26/2017    Past Medical History:  Diagnosis Date   Adnexal mass 2019   likely remnant fibroid on broad ligament; getting diagnostic lap   Anxiety    on meds   Arthritis    hip-uses OTC meds   Asthma    uses inhaler as needed   Carpal tunnel syndrome    bilateral   Chest wall  pain 10/15/2016   Chronic lower back pain    COPD (chronic obstructive pulmonary disease) (HCC)    uses in haler as needed  Depression    on meds   Dyspnea    with exertion   GERD (gastroesophageal reflux disease)    Headache    migraines   Herpes    Hypertension    on meds   Liver lesion, right lobe 12/01/2015   CT chest without contrast 11/30/15 with hepatic lesions measuring 2.0 x 1.7 cm of the right lobe and 1 x 1 cm on the dome. CT abd/pel 8/18: 1.3cm inferior R liver lobe, prob benign but recommend 3-44mo f/u with MRI w/wo IV contrast with attenuation MRI 6/19: combination of benign cavernous hemangiomas and small benign cysts   Poor dental hygiene    chipped upper front and several loose teeth   Seasonal allergies    Sickle cell trait (HCC)    Substance abuse (HCC)    cocaine/crack cocaine - last use 02/07/18   SVD (spontaneous vaginal delivery)    x 1   Tobacco abuse 07/08/2015   Uses walker    for ambulation   Uterine leiomyoma 02/16/2016   s/p vaginal hysterectomy 2018   Vaginal discharge 03/26/2018    Tobacco History: Social History   Tobacco Use  Smoking Status Former   Average packs/day: 0.5 packs/day for 35.5 years (17.7 ttl pk-yrs)   Types: Cigarettes   Start date: 05/22/1983  Smokeless Tobacco Never   Counseling given: Not Answered   Continue to not smoke  Outpatient Encounter Medications as of 04/23/2023  Medication Sig   acetaminophen (TYLENOL) 500 MG tablet Take 500 mg by mouth every 6 (six) hours as needed for moderate pain.   amLODipine (NORVASC) 10 MG tablet Take 1 tablet (10 mg total) by mouth daily.   aspirin 81 MG chewable tablet Chew 1 tablet (81 mg total) by mouth daily.   atorvastatin (LIPITOR) 40 MG tablet Take 1 tablet (40 mg total) by mouth daily.   Budeson-Glycopyrrol-Formoterol (BREZTRI AEROSPHERE) 160-9-4.8 MCG/ACT AERO Inhale 2 puffs into the lungs in the morning and at bedtime.   budesonide-formoterol (SYMBICORT) 160-4.5 MCG/ACT  inhaler Inhale 2 puffs into the lungs 2 (two) times daily.   cyclobenzaprine (FLEXERIL) 10 MG tablet TAKE 1 TABLET BY MOUTH TWICE A DAY AS NEEDED FOR MUSCLE SPASMS   famotidine (PEPCID) 20 MG tablet TAKE 1 TABLET BY MOUTH TWICE A DAY   gabapentin (NEURONTIN) 300 MG capsule TAKE 1 CAPSULE BY MOUTH EVERYDAY AT BEDTIME   metoprolol tartrate (LOPRESSOR) 25 MG tablet Take 0.5 tablets (12.5 mg total) by mouth 2 (two) times daily.   pantoprazole (PROTONIX) 40 MG tablet Take 1 tablet (40 mg total) by mouth daily.   sertraline (ZOLOFT) 100 MG tablet TAKE 1 TABLET BY MOUTH EVERY DAY   spironolactone (ALDACTONE) 25 MG tablet Take 1 tablet (25 mg total) by mouth daily.   tetrahydrozoline 0.05 % ophthalmic solution Place 1 drop into both eyes daily as needed (for dry eyes).    [DISCONTINUED] VENTOLIN HFA 108 (90 Base) MCG/ACT inhaler INHALE 2 PUFFS INTO THE LUNGS EVERY 6 (SIX) HOURS AS NEEDED FOR WHEEZING OR SHORTNESS OF BREATH (COUGHING).   albuterol (VENTOLIN HFA) 108 (90 Base) MCG/ACT inhaler Inhale 2 puffs into the lungs every 4 (four) hours as needed for wheezing or shortness of breath.   Facility-Administered Encounter Medications as of 04/23/2023  Medication   0.9 %  sodium chloride infusion     Review of Systems  Review of Systems  No chest pain with exertion.  No orthopnea or PND.  Comprehensive review of systems otherwise negative. Physical Exam  BP (!) 150/100 (BP Location: Left Arm, Cuff Size: Normal)   Pulse 90   Temp 98 F (36.7 C) (Oral)   Ht 5\' 4"  (1.626 m)   Wt 130 lb 9.6 oz (59.2 kg)   LMP  (LMP Unknown)   SpO2 96%   BMI 22.42 kg/m   Wt Readings from Last 5 Encounters:  04/23/23 130 lb 9.6 oz (59.2 kg)  02/17/23 136 lb (61.7 kg)  01/11/23 131 lb 13.4 oz (59.8 kg)  11/29/22 131 lb 12.8 oz (59.8 kg)  11/21/22 128 lb 12.8 oz (58.4 kg)    BMI Readings from Last 5 Encounters:  04/23/23 22.42 kg/m  02/17/23 23.34 kg/m  01/11/23 22.63 kg/m  11/29/22 22.62 kg/m   11/21/22 22.11 kg/m     Physical Exam General: Sitting in chair, no acute distress Eyes: EOMI, no icterus Neck: Supple, no JVP Pulmonary: Clear, normal work of breathing Cardiovascular: Tachycardic, no edema Abdomen: Is doing about sounds present MSK: No synovitis, no joint effusion Neuro: Appears very anxious, jittery, rides around in the chair, tapped her toes a lot.  Overall this looks uncomfortable, flexing at the hips up and down at times, no focal deficits Psych: Normal mood, full affect   Assessment & Plan:   Dyspnea on exertion: Suspect largely related to poorly controlled asthma.  Prior pulmonary function test revealed significant bronchodilator response air trapping.  Normal DLCO.  Chest x-ray 2022 hyperinflated.  No fixed obstruction cystic COPD on prior PFTs.  Not on maintenance inhaler..  See below.  Moderate persistent asthma: Without maintenance inhalers for months.  Worsening dyspnea over the last several months.  Start Symbicort high-dose 2 puff twice daily.  Refill albuterol as needed.  Consider addition of LAMA in the future poorly controlled.  Can phenotype and consider biologic therapy in the future as well.  Return in about 3 months (around 07/22/2023) for f/u Dr. Judeth Horn.   Karren Burly, MD 04/23/2023   This appointment required 62 minutes of patient care (this includes precharting, chart review, review of results, face-to-face care, etc.).

## 2023-04-23 NOTE — Telephone Encounter (Signed)
Called patient to schedule mri review with Jean [name]. Left message to give [place] a call back.

## 2023-04-27 ENCOUNTER — Other Ambulatory Visit: Payer: Medicaid Other

## 2023-04-29 ENCOUNTER — Ambulatory Visit
Admission: RE | Admit: 2023-04-29 | Discharge: 2023-04-29 | Disposition: A | Discharge status: Home / Self Care | Payer: Medicaid Other | Source: Ambulatory Visit | Attending: Physician Assistant | Admitting: Physician Assistant

## 2023-04-29 DIAGNOSIS — M25561 Pain in right knee: Secondary | ICD-10-CM

## 2023-05-06 ENCOUNTER — Telehealth: Payer: Self-pay | Admitting: *Deleted

## 2023-05-06 NOTE — Telephone Encounter (Signed)
Called patient to give her lab results for research  study she is  enrolled in. Left message for patient to call back.   [name],Research Coordinator 05/06/2023  16:21 pm

## 2023-05-06 NOTE — Telephone Encounter (Signed)
Called patient to give her lab results for study that she is enrolled in. Left message for patient to call back.   [place] Daniela Siebers, Research Coordinator 05/06/2023

## 2023-05-13 ENCOUNTER — Ambulatory Visit: Payer: Medicaid Other | Admitting: Physician Assistant

## 2023-05-13 ENCOUNTER — Encounter: Payer: Self-pay | Admitting: Radiology

## 2023-05-15 ENCOUNTER — Telehealth: Payer: Self-pay

## 2023-05-15 NOTE — Telephone Encounter (Signed)
 Please advise of MRI results for right knee.  Thank you.

## 2023-05-23 ENCOUNTER — Ambulatory Visit: Payer: Medicaid Other | Admitting: Physician Assistant

## 2023-05-23 ENCOUNTER — Encounter: Payer: Self-pay | Admitting: Physician Assistant

## 2023-05-23 DIAGNOSIS — M1711 Unilateral primary osteoarthritis, right knee: Secondary | ICD-10-CM

## 2023-05-23 NOTE — Progress Notes (Signed)
HPI: Jean [name] returns today to go over the MRI of her right knee.  MRI was formed on 05/15/2023.  Shows tricompartmental arthritic changes.  Full-thickness cartilage loss involving the trochlear groove with mild soap chondral marrow edema.  Mild fissuring apex of the patella.  Medial compartment with high-grade partial-thickness cartilage loss over the weightbearing medial femoral tibial compartment.  Lateral compartment mild partial thickness cartilage loss over the weightbearing lateral femoral condyle.  There is also significant bone marrow edema involving the lateral femoral condyle consistent with a nondisplaced or nondepressed subchondral fracture.  Images reviewed with the patient. Patient again states she had no known injury to the knee other than she notes that she felt something pop in her knee when her knee was aspirated back in October by the ED  and she has had increased pain since that time.   Review of systems: Denies any fevers, chills, shortness of breath, orthopnea, ongoing infections or chest pain.  Physical exam: General Well-developed well-nourished thin female in no acute distress ambulates with a rollator. Psych: Alert and oriented x 3 Right knee full extension full flexion.  Maximal tenderness of the medial femoral condyle.  Left calf supple nontender.  Impression: Tricompartmental arthritis right knee  Plan: After reviewing the MRI images with her.  Dr. Magnus Ivan and myself recommended right total knee arthroplasty she has failed conservative treatment has had pain since the early fall in the knee.  Risk benefits of surgery discussed with patient at length.  Risk include but are not limited to nerve vessel injury, blood loss, infection, DVT PE, and prolonged pain.  The model was shown.  Will work on scheduling her for right total knee arthroplasty in the near future.

## 2023-05-31 ENCOUNTER — Telehealth: Payer: Self-pay | Admitting: Physician Assistant

## 2023-05-31 ENCOUNTER — Other Ambulatory Visit: Payer: Self-pay | Admitting: Physician Assistant

## 2023-05-31 ENCOUNTER — Telehealth: Payer: Self-pay

## 2023-05-31 MED ORDER — TRAMADOL HCL 50 MG PO TABS: 50.0000 mg | ORAL_TABLET | Freq: Four times a day (QID) | ORAL | 0 refills | Status: DC | PRN

## 2023-05-31 NOTE — Telephone Encounter (Signed)
Patient request something for pain  Pharmacy [facility] on [address]

## 2023-05-31 NOTE — Telephone Encounter (Signed)
I called patient to discuss scheduling surgery and left voice mail for return call.

## 2023-06-05 ENCOUNTER — Encounter: Payer: Self-pay | Admitting: Student

## 2023-06-05 ENCOUNTER — Ambulatory Visit (INDEPENDENT_AMBULATORY_CARE_PROVIDER_SITE_OTHER): Payer: Medicaid Other | Admitting: Student

## 2023-06-05 VITALS — BP 120/74 | HR 63 | Temp 98.2°F | Ht 64.0 in | Wt 133.2 lb

## 2023-06-05 DIAGNOSIS — M1711 Unilateral primary osteoarthritis, right knee: Secondary | ICD-10-CM

## 2023-06-05 DIAGNOSIS — I10 Essential (primary) hypertension: Secondary | ICD-10-CM | POA: Diagnosis not present

## 2023-06-05 DIAGNOSIS — Z5941 Food insecurity: Secondary | ICD-10-CM

## 2023-06-05 DIAGNOSIS — F320 Major depressive disorder, single episode, mild: Secondary | ICD-10-CM

## 2023-06-05 MED ORDER — SERTRALINE HCL 25 MG PO TABS: 25.0000 mg | ORAL_TABLET | Freq: Every day | ORAL | 5 refills | Status: DC

## 2023-06-05 NOTE — Assessment & Plan Note (Signed)
Patient has a past medical history of depression.  She states she has been feeling very down recently.  The diagnosis of this fracture and the thought of having surgery made her emotional.  She also endorses that she felt like no one is listening to her about her concerns.  Elevated PHQ-9 today at 21.  Talk to patient about potential medication therapy.  She does report that she would be interested in this.  Of note, patient was prescribed sertraline in the past, but looks like she never took it as I do not see any dispense report.  Will initiate sertraline therapy today.  Plan: -Start sertraline 25 mg daily -Follow-up in 1 month -Patient referred to [facility]

## 2023-06-05 NOTE — Assessment & Plan Note (Addendum)
Patient has a past medical history of osteoarthritis of right knee.  Patient has been dealing with this knee pain for a while now.  Patient went to the emergency department a few months ago and at that time patient had a joint effusion in the right knee and had aspiration.  Patient followed up with orthopedics at that time they recommended total right knee arthroplasty.  Recent MRI did reveal a nondisplaced, nondepressed subchondral fracture along the posterior nonweightbearing surface of the medial femoral condyle.  Patient also found to have tricompartmental cartilage abnormalities.  Patient reports lots of pain.  Patient was started on tramadol from orthopedics.  Plan: -Plan is for orthopedics to do total right knee arthroplasty on July 02, 2023 -Continue tramadol to bridge for pain management until patient is able to get definitive treatment

## 2023-06-05 NOTE — Assessment & Plan Note (Addendum)
Patient has a past medical history of hypertension.  Current medications include amlodipine 10 mg daily, spironolactone 25 mg daily, metoprolol tartrate 12.5 mg twice daily. Blood pressure well-controlled at this time.  Blood pressure today on exam is 120/74.  This is at goal.  Patient endorses compliance to her medication.  Plan: -Continue amlodipine 10 mg daily -Continue spironolactone 25 mg daily -Continue metoprolol tartrate 12.5 mg twice daily -Continue to monitor blood pressure

## 2023-06-05 NOTE — Patient Instructions (Addendum)
Jean [name] you for allowing me to take part in your care today.  Here are your instructions.  1.  Regarding your food insecurity, I have referred you to social work.  They will call you to go over options.  You can also get on the Meals on Wheels list if you call the senior line at [phone].  2.  I am starting you on sertraline.  This is 25 mg daily.  Please take this.  This should help with your mood.  3.  Please call the [facility] the phone number is [phone].  They will help you with counseling.  4.  Good luck on your surgery.  We can see you after your surgery.  Thank you, Dr. Allena Katz  If you have any other questions please contact the internal medicine clinic at [phone] If it is after hours, please call the [facility] at [phone] and then ask the person who picks up for the resident on call.

## 2023-06-05 NOTE — Assessment & Plan Note (Signed)
Due to patient's osteoarthritis, she has lost her job as she is unable to work.  Patient is on disability.  Patient is currently living in a boarding room in a house with the landlord.  She reports major financial struggles.  She states there are times where she does not have meals, and only has snacks.  Patient was emotional talking to me about this.  Plan: -Refer patient to case management for food insecurity and financial help -Patient referred to [employer] and given phone number to call to get on a wait list

## 2023-06-05 NOTE — Progress Notes (Signed)
CC: Depression  HPI:  [name] is a 58 y.o. female with past medical history of osteoarthritis, COPD, GERD, hypertension who presents for right knee pain and depression.  Please see assessment and plan for full HPI.  Medications: Hypertension: Amlodipine 10 mg daily, spironolactone 25 mg daily, metoprolol tartrate 12.5 mg twice daily Atherosclerosis: Aspirin 81 mg daily, Lipitor 40 mg daily COPD: Breztri 2 puffs twice daily Osteoarthritis: Tramadol 50 mg every 6 hours as needed Depression: Sertraline 25 mg daily GERD: Pantoprazole 40 mg daily Neuropathy: Gabapentin 300 mg nightly  Back pain: Flexeril 10 mg as needed  Reviewed orthopedic chart, and plan will be for total right knee arthroplasty on 07/02/23.   11/29/2022 A1c 6 months ago 5.5 Lipid profile: LDL 119, cholesterol 272, HDL 146 BMP: Creatinine 1.44 EGFR 42, sodium 141, potassium 4.5  Past Medical History:  Diagnosis Date   Adnexal mass 2019   likely remnant fibroid on broad ligament; getting diagnostic lap   Anxiety    on meds   Arthritis    hip-uses OTC meds   Asthma    uses inhaler as needed   Carpal tunnel syndrome    bilateral   Chest wall pain 10/15/2016   Chronic lower back pain    COPD (chronic obstructive pulmonary disease) (HCC)    uses in haler as needed   Depression    on meds   Dyspnea    with exertion   GERD (gastroesophageal reflux disease)    Headache    migraines   Herpes    Hypertension    on meds   Liver lesion, right lobe 12/01/2015   CT chest without contrast 11/30/15 with hepatic lesions measuring 2.0 x 1.7 cm of the right lobe and 1 x 1 cm on the dome. CT abd/pel 8/18: 1.3cm inferior R liver lobe, prob benign but recommend 3-49mo f/u with MRI w/wo IV contrast with attenuation MRI 6/19: combination of benign cavernous hemangiomas and small benign cysts   Poor dental hygiene    chipped upper front and several loose teeth   Seasonal allergies    Sickle cell trait (HCC)     Substance abuse (HCC)    cocaine/crack cocaine - last use 02/07/18   SVD (spontaneous vaginal delivery)    x 1   Tobacco abuse 07/08/2015   Uses walker    for ambulation   Uterine leiomyoma 02/16/2016   s/p vaginal hysterectomy 2018   Vaginal discharge 03/26/2018     Current Outpatient Medications:    sertraline (ZOLOFT) 25 MG tablet, Take 1 tablet (25 mg total) by mouth daily., Disp: 30 tablet, Rfl: 5   acetaminophen (TYLENOL) 500 MG tablet, Take 500 mg by mouth every 6 (six) hours as needed for moderate pain., Disp: , Rfl:    albuterol (VENTOLIN HFA) 108 (90 Base) MCG/ACT inhaler, Inhale 2 puffs into the lungs every 4 (four) hours as needed for wheezing or shortness of breath., Disp: 18 each, Rfl: 1   amLODipine (NORVASC) 10 MG tablet, Take 1 tablet (10 mg total) by mouth daily., Disp: 90 tablet, Rfl: 3   aspirin 81 MG chewable tablet, Chew 1 tablet (81 mg total) by mouth daily., Disp: 90 tablet, Rfl: 3   atorvastatin (LIPITOR) 40 MG tablet, Take 1 tablet (40 mg total) by mouth daily., Disp: 90 tablet, Rfl: 3   Budeson-Glycopyrrol-Formoterol (BREZTRI AEROSPHERE) 160-9-4.8 MCG/ACT AERO, Inhale 2 puffs into the lungs in the morning and at bedtime., Disp: , Rfl:    budesonide-formoterol (  SYMBICORT) 160-4.5 MCG/ACT inhaler, Inhale 2 puffs into the lungs 2 (two) times daily., Disp: 1 each, Rfl: 12   cyclobenzaprine (FLEXERIL) 10 MG tablet, TAKE 1 TABLET BY MOUTH TWICE A DAY AS NEEDED FOR MUSCLE SPASMS, Disp: 30 tablet, Rfl: 3   famotidine (PEPCID) 20 MG tablet, TAKE 1 TABLET BY MOUTH TWICE A DAY, Disp: 180 tablet, Rfl: 1   gabapentin (NEURONTIN) 300 MG capsule, TAKE 1 CAPSULE BY MOUTH EVERYDAY AT BEDTIME, Disp: 90 capsule, Rfl: 1   metoprolol tartrate (LOPRESSOR) 25 MG tablet, Take 0.5 tablets (12.5 mg total) by mouth 2 (two) times daily., Disp: 90 tablet, Rfl: 2   pantoprazole (PROTONIX) 40 MG tablet, Take 1 tablet (40 mg total) by mouth daily., Disp: 90 tablet, Rfl: 1   spironolactone  (ALDACTONE) 25 MG tablet, Take 1 tablet (25 mg total) by mouth daily., Disp: 90 tablet, Rfl: 3   tetrahydrozoline 0.05 % ophthalmic solution, Place 1 drop into both eyes daily as needed (for dry eyes). , Disp: , Rfl:    traMADol (ULTRAM) 50 MG tablet, Take 1 tablet (50 mg total) by mouth every 6 (six) hours as needed., Disp: 30 tablet, Rfl: 0  Current Facility-Administered Medications:    0.9 %  sodium chloride infusion, 500 mL, Intravenous, Once, Jean Fee, MD  Review of Systems:    MSK: Patient endorse right knee pain  Psych: Patient endorses depressed mood   Physical Exam:  Vitals:   06/05/23 1333  BP: 120/74  Pulse: 63  Temp: 98.2 F (36.8 C)  TempSrc: Oral  SpO2: 99%  Weight: 133 lb 3.2 oz (60.4 kg)  Height: 5\' 4"  (1.626 m)    General: Patient is sitting and tearful in the room Cardio: Regular rate and rhythm, no murmurs, rubs or gallops Pulmonary: Clear to ausculation bilaterally with no rales, rhonchi, and crackles  MSK: Decreased range of motion of right lower extremity secondary to pain on the extension and flexion Psych: Tearful in the room  Assessment & Plan:   HTN (hypertension) Patient has a past medical history of hypertension.  Current medications include amlodipine 10 mg daily, spironolactone 25 mg daily, metoprolol tartrate 12.5 mg twice daily. Blood pressure well-controlled at this time.  Blood pressure today on exam is 120/74.  This is at goal.  Patient endorses compliance to her medication.  Plan: -Continue amlodipine 10 mg daily -Continue spironolactone 25 mg daily -Continue metoprolol tartrate 12.5 mg twice daily -Continue to monitor blood pressure  Osteoarthritis of right knee Patient has a past medical history of osteoarthritis of right knee.  Patient has been dealing with this knee pain for a while now.  Patient went to the emergency department a few months ago and at that time patient had a joint effusion in the right knee and had  aspiration.  Patient followed up with orthopedics at that time they recommended total right knee arthroplasty.  Recent MRI did reveal a nondisplaced, nondepressed subchondral fracture along the posterior nonweightbearing surface of the medial femoral condyle.  Patient also found to have tricompartmental cartilage abnormalities.  Patient reports lots of pain.  Patient was started on tramadol from orthopedics.  Plan: -Plan is for orthopedics to do total right knee arthroplasty on July 02, 2023 -Continue tramadol to bridge for pain management until patient is able to get definitive treatment  Food insecurity Due to patient's osteoarthritis, she has lost her job as she is unable to work.  Patient is on disability.  Patient is currently living in a  boarding room in a house with the landlord.  She reports major financial struggles.  She states there are times where she does not have meals, and only has snacks.  Patient was emotional talking to me about this.  Plan: -Refer patient to case management for food insecurity and financial help -Patient referred to [employer] Whe[employer] and given phone number to call to get on a wait list  MDD (major depressive disorder) Patient has a past medical history of depression.  She states she has been feeling very down recently.  The diagnosis of this fracture and the thought of having surgery made her emotional.  She also endorses that she felt like no one is listening to her about her concerns.  Elevated PHQ-9 today at 21.  Talk to patient about potential medication therapy.  She does report that she would be interested in this.  Of note, patient was prescribed sertraline in the past, but looks like she never took it as I do not see any dispense report.  Will initiate sertraline therapy today.  Plan: -Start sertraline 25 mg daily -Follow-up in 1 month -Patient referred to [facility]  Patient discussed with Dr. Marquita Palms, DO PGY-2 Internal Medicine Resident   Pager: [phone]

## 2023-06-10 NOTE — Progress Notes (Signed)
 Internal Medicine Clinic Attending  Case discussed with the resident at the time of the visit.  We reviewed the resident's history and exam and pertinent patient test results.  I agree with the assessment, diagnosis, and plan of care documented in the resident's note.

## 2023-06-11 ENCOUNTER — Other Ambulatory Visit: Payer: Self-pay

## 2023-06-13 ENCOUNTER — Other Ambulatory Visit: Payer: Self-pay | Admitting: Licensed Clinical Social Worker

## 2023-06-13 DIAGNOSIS — F321 Major depressive disorder, single episode, moderate: Secondary | ICD-10-CM

## 2023-06-13 NOTE — Patient Outreach (Signed)
 Medicaid Managed Care Social Work Note  06/13/2023 Name:  Jean [name] MRN:  [id number] DOB:  [birth year]  Jean [name] is an 58 y.o. year old female who is a primary patient of Harrie Bruckner, DO.  The Medicaid Managed Care Coordination team was consulted for assistance with:  Mental Health Counseling and Resources  Jean [name] was given information about Medicaid Managed Care Coordination team services today. Jean [name] Patient agreed to services and verbal consent obtained.  Engaged with patient  for by telephone forinitial visit in response to referral for case management and/or care coordination services.   Patient is participating in a Managed Medicaid Plan:  Yes  Assessments/Interventions:  Review of past medical history, allergies, medications, health status, including review of consultants reports, laboratory and other test data, was performed as part of comprehensive evaluation and provision of chronic care management services.  SDOH: (Social Drivers of Health) assessments and interventions performed: SDOH Interventions    Flowsheet Row Patient Outreach Telephone from 06/13/2023 in [facility] Office Visit from 06/05/2023 in [facility] - A Dept Of [place]. [facility] Office Visit from 05/04/2021 in [facility] - A Dept Of [place]. [facility] Office Visit from 10/28/2019 in [facility] - A Dept Of [place]. [facility] Office Visit from 04/24/2017 in [facility] - A Dept [place]. [facility] Office Visit from 01/21/2017 in [facility] - A Dept Of [place]. [facility]  SDOH Interventions        Food Insecurity Interventions Community Resources Provided Intervention Not Indicated -- -- -- --  Housing Interventions Intervention Not Indicated Intervention Not Indicated -- -- -- --  Transportation  Interventions -- Intervention Not Indicated -- -- -- --  Utilities Interventions Intervention Not Indicated Intervention Not Indicated -- -- -- --  Depression Interventions/Treatment  -- Medication Medication  [PATIENT STATES SHE ON MEDICATION] Medication Medication Medication  Financial Strain Interventions Community Resources Provided Other (Comment)  [need referral to social worker] -- -- -- --  Stress Interventions Community Resources Provided, Provide Counseling Intervention Not Indicated -- -- -- --  Social Connections Interventions -- Intervention Not Indicated -- -- -- --       Advanced Directives Status:  See Care Plan for related entries.  Care Plan                 Allergies  Allergen Reactions   Lisinopril Swelling and Other (See Comments)    Lip swelling, patient reported - unable to confirm from chart     Medications Reviewed Today     Reviewed by Merlynn Lyle CROME, LCSW (Social Worker) on 06/13/23 at 1517  Med List Status: <None>   Medication Order Taking? Sig Documenting Provider Last Dose Status Informant  0.9 %  sodium chloride  infusion [id number]   Teressa Toribio SQUIBB, MD  Active   acetaminophen  (TYLENOL ) 500 MG tablet [id number] No Take 500 mg by mouth every 6 (six) hours as needed for moderate pain. [provider] Taking Active   albuterol  (VENTOLIN  HFA) 108 (90 Base) MCG/ACT inhaler [id number] No Inhale 2 puffs into the lungs every 4 (four) hours as needed for wheezing or shortness of breath. Hunsucker, Donnice SAUNDERS, MD Taking Active   amLODipine  (NORVASC ) 10 MG tablet [id number] No Take 1 tablet (10 mg total) by mouth daily. Lucien Orren SAILOR, PA-C Taking  Expired 04/23/23 2359   aspirin  81 MG chewable tablet [id number] No Chew 1 tablet (81 mg total) by mouth daily. Lucien Orren SAILOR, PA-C Taking Active   atorvastatin  (LIPITOR) 40 MG tablet [id number] No Take 1 tablet (40 mg total) by mouth daily. Lucien Orren SAILOR, [place] Taking Expired 04/23/23 2359    Budeson-Glycopyrrol-Formoterol  (BREZTRI  AEROSPHERE) 160-9-4.8 MCG/ACT AERO [id number] No Inhale 2 puffs into the lungs in the morning and at bedtime. Hunsucker, Donnice SAUNDERS, MD Taking Active   budesonide -formoterol  (SYMBICORT ) 160-4.5 MCG/ACT inhaler [id number] No Inhale 2 puffs into the lungs 2 (two) times daily. Hunsucker, Donnice SAUNDERS, MD Taking Active   cyclobenzaprine  (FLEXERIL ) 10 MG tablet [id number] No TAKE 1 TABLET BY MOUTH TWICE A DAY AS NEEDED FOR MUSCLE SPASMS Vernetta Lonni GRADE, MD Taking Active   famotidine  (PEPCID ) 20 MG tablet [id number] No TAKE 1 TABLET BY MOUTH TWICE A DAY Harrie Lonni, DO Taking Active   gabapentin  (NEURONTIN ) 300 MG capsule [id number] No TAKE 1 CAPSULE BY MOUTH EVERYDAY AT BEDTIME Vernetta Lonni GRADE, MD Taking Active   metoprolol  tartrate (LOPRESSOR ) 25 MG tablet [id number] No Take 0.5 tablets (12.5 mg total) by mouth 2 (two) times daily. Lucien Orren SAILOR, [place] Taking Expired 04/23/23 2359   pantoprazole  (PROTONIX ) 40 MG tablet [id number] No Take 1 tablet (40 mg total) by mouth daily. Harrie Lonni, DO Taking Active   sertraline  (ZOLOFT ) 25 MG tablet [id number]  Take 1 tablet (25 mg total) by mouth daily. Tobie Gaines, DO  Active   spironolactone  (ALDACTONE ) 25 MG tablet [id number] No Take 1 tablet (25 mg total) by mouth daily. Nahser, Aleene PARAS, MD Taking Active   tetrahydrozoline 0.05 % ophthalmic solution [id number] No Place 1 drop into both eyes daily as needed (for dry eyes).  [provider] Taking Active Self  traMADol  (ULTRAM ) 50 MG tablet [id number]  Take 1 tablet (50 mg total) by mouth every 6 (six) hours as needed. Gretta Bertrum ORN, PA-C  Active             Patient Active Problem List   Diagnosis Date Noted   Osteoarthritis of right knee 06/05/2023   Food insecurity 06/05/2023   Bilateral shoulder pain 11/29/2022   Dysphagia 11/29/2022   History of cardioembolic cerebrovascular accident (CVA) 11/29/2022   Dizziness 06/19/2022    Constipation 05/09/2021   Weight loss 05/09/2021   Headache    Symptomatic bradycardia 04/18/2021   Near syncope 04/18/2021   Synovitis of left knee 07/26/2020   COVID-19 virus infection 05/11/2020   Low back pain 11/24/2019   Impingement syndrome of right shoulder 04/22/2019   History of dental surgery 02/05/2019   COPD GOLD 0 ? 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   GERD (gastroesophageal reflux disease)    Breast pain 06/23/2018   Dyspareunia in female 09/13/2017   Cervicalgia 04/24/2017   Chronic kidney disease, stage 3a (HCC) 10/29/2016   Chronic pain syndrome 05/10/2016   MDD (major depressive disorder) 10/19/2015   Dyspnea on exertion 08/22/2015   HTN (hypertension) 07/08/2015   Routine health maintenance 07/08/2015   Timeframe:  Short-Range Goal Priority:  High Start Date:   06/13/23              Expected End Date:  ongoing                     Follow Up Date--06/28/23 at 3:15 am  - keep 90 percent of scheduled appointments -consider counseling  or psychiatry -consider bumping up your self-care  -consider creating a stronger support network   Why is this important?             Combating depression may take some time.            If you don't feel better right away, don't give up on your treatment plan.    Current barriers:   Chronic Mental Health needs related to symptoms of depression, stress and anxiety. Patient requires Support, Education, Resources, Referrals, Advocacy, and Care Coordination, in order to meet Unmet Mental Health Needs and to find a therapist and psychiatrist. Mental Health Concerns and Social Isolation Mobility concerns and upcoming total knee replacement- [facility] RNCM referral placed today Patient lacks knowledge of local and available community counseling agencies and resources.  Clinical Goal(s): verbalize understanding of plan for management of Anxiety, Depression, and Stress symptoms and demonstrate a  reduction in symptoms. Patient will connect with a provider for ongoing mental health treatment, increase coping skills, healthy habits, self-management skills, and stress reduction      Patient will implement clinical interventions discussed today to decrease symptoms of depression and increase knowledge and/or ability of: coping skills.    Clinical Interventions:  Assessed patient's previous and current treatment, coping skills, support system and barriers to care. Patient provided hx. Patient lives with two males in a house and rents a room. Her daughter resides in [place] so her main support is her friend Seena whose house she resides in.  Verbalization of feelings encouraged, motivational interviewing employed Emotional support provided, positive coping strategies explored. Establishing healthy boundaries emphasized and healthy self-care education provided Patient was educated on available mental health resources within their area that accept Medicaid and offer counseling and psychiatry. Patient was advised to contact the back of her [facility] insurance card for assistance with benefits as well. Patient is agreeable to referral to [facility] for counseling and psychiatry. [facility] LCSW made referral on 06/13/23. Patient educated on the difference between therapy and psychiatry per patient request Education provided on instructions for scheduling at [facility] as well as some crisis support resources and [facility]'s walk in clinic hours. Emotional support provided. CBT intervention implemented regarding being mentally fit by combating negative thinking and replacing it with uplifting support, hope and positivity. Patient reports that she is on Zoloft  but often times forgets to take this medication.  Patient is on food stamps.  Assessed social determinant of health barriers LCSW provided education on relaxation techniques such as meditation, deep breathing, massage, grounding exercises or yoga that can activate the  body's relaxation response and ease symptoms of stress and anxiety. LCSW ask that when pt is struggling with difficult emotions and racing thoughts that they start this relaxation response process. LCSW provided extensive education on healthy coping skills for anxiety. SW used active and reflective listening, validated patient's feelings/concerns, and provided emotional support. Patient will work on implementing appropriate self-care habits into their daily routine such as: staying positive, writing a gratitude list, drinking water , staying active around the house, taking their medications as prescribed, combating negative thoughts or emotions and staying connected with their family and friends. Positive reinforcement provided for this decision to work on this. LCSW provided education on healthy sleep hygiene and what that looks like. LCSW encouraged patient to implement a night time routine into their schedule that works best for them and that they are able to maintain. Advised patient to implement deep breathing/grounding/meditation/self-care exercises into their nightly routine to combat racing thoughts  at night. LCSW encouraged patient to wake up at the same time each day, make their sleeping environment comfortable, exercise when able, to limit naps and to not eat or drink anything right before bed. Explained to go to bed at the same time each night and get up at the same time each morning, including on the weekends. Make sure your bedroom is quiet, dark, relaxing, and at a comfortable temperature. Remove electronic devices, such as TVs, computers, and smart phones, from the bedroom.   Motivational Interviewing employed Depression screen reviewed  PHQ2/ PHQ9 completed or reviewed  Mindfulness or Relaxation training provided Active listening / Reflection utilized  Advance Care and HCPOA education provided Emotional Support Provided Problem Solving /Task Center strategies reviewed Provided  psychoeducation for mental health needs  Provided brief CBT  Reviewed mental health medications and discussed importance of compliance:  Quality of sleep assessed & Sleep Hygiene techniques promoted  Participation in counseling encouraged  Verbalization of feelings encouraged  Suicidal Ideation/Homicidal Ideation assessed: Patient denies SI/HI  Review resources, discussed options and provided patient information about  Mental Health Resources Inter-disciplinary care team collaboration (see longitudinal plan of care)  Patient Goals/Self-Care Activities: Take medications as prescribed   Attend all scheduled provider appointments Call pharmacy for medication refills 3-7 days in advance of running out of medications Perform all self care activities independently  Perform IADL's (shopping, preparing meals, housekeeping, managing finances) independently Call provider office for new concerns or questions Work with the social worker to address care coordination needs and will continue to work with the clinical team to address health care and disease management related needs call 1-800-273-TALK (toll free, 24 hour hotline) If in a crisis, CALL 988 or go to [facility] [address], [place] [phone]) Utilize healthy coping skills and supportive resources discussed Contact PCP with any questions or concerns Keep 90 percent of counseling appointments Call your insurance provider for more information about your Enhanced Benefits  Accept all calls from representative with Behavioral Health Providers in an effort to establish ongoing mental health counseling and supportive services. Incorporate into daily practice - relaxation techniques, deep breathing exercises, and mindfulness meditation strategies. Talk about feelings with friends, family members, spiritual advisor, etc. Contact LCSW directly [phone]), if you have questions, need assistance, or if  additional social work needs are identified between now and our next scheduled telephone outreach call. Call 988 for mental health hotline/crisis line if needed (24/7 available) Try techniques to reduce symptoms of anxiety/negative thinking (deep breathing, distraction, positive self talk, etc)  - develop a personal safety plan - exercise at least 2 to 3 times per week - have a plan for how to handle bad days - journal feelings and what helps to feel better or worse - spend time or talk with others at least 2 to 3 times per week - watch for early signs of feeling worse - begin personal counseling - call and visit an old friend - check out volunteer opportunities - join a support group - laugh; watch a funny movie or comedian - learn and use visualization or guided imagery - perform a random act of kindness - practice relaxation or meditation daily - start or continue a personal journal - practice positive thinking and self-talk -continue with compliance of taking medication  -identify current effective and ineffective coping strategies.  -implement positive self-talk in care to increase self-esteem, confidence and feelings of control.  -consider alternative and complementary therapy approaches such as meditation,  mindfulness or yoga.  Call your insurance provider to gain education on benefits if desired Call your primary care doctor if symptoms get worse  -journaling, prayer, worship services, meditation or pastoral counseling.  -increase participation in pleasurable group activities such as hobbies, singing, sports or volunteering).  -consider the use of meditative movement therapy such as tai chi or yoga  -start a regular daily exercise program based on tolerance, ability and patient choice to support positive thinking and activity    Follow Up Plan:  The patient has been provided with contact information for the care management team and has been advised to call with any mental health  or health related questions or concerns.  The care management team will reach out to the patient again over the next 30 business  days.   If you are experiencing a Mental Health or Behavioral Health Crisis or need someone to talk to, please call the Suicide and Crisis Lifeline: 988    Patient Goals: Initial goal   Follow up:  Patient agrees to Care Plan and Follow-up.  Plan: The Managed Medicaid care management team will reach out to the patient again over the next 30 days.  Lyle Rung, BSW, MSW, LCSW Licensed Clinical Social Worker [employer]   [email or web address] Direct Dial: [phone]

## 2023-06-14 NOTE — Patient Instructions (Signed)
 Visit Information  Jean [name] was given information about Medicaid Managed Care team care coordination services as a part of their [facility] Community [facility] Medicaid benefit. Jean [name] verbally consented to engagement with the [facility] Managed Care team.   If you are experiencing a medical emergency, please call 911 or report to your local emergency department or urgent care.   If you have a non-emergency medical problem during routine business hours, please contact your provider's office and ask to speak with a nurse.   For questions related to your [facility], please call: [phone] or visit the homepage here: [email or web address]  If you would like to schedule transportation through your [facility], please call the following number at least 2 days in advance of your appointment: [phone]   Rides for urgent appointments can also be made after hours by calling Member Services.  Call the Behavioral Health Crisis Line at [phone], at any time, 24 hours a day, 7 days a week. If you are in danger or need immediate medical attention call 911.  If you would like help to quit smoking, call 1-800-QUIT-NOW (5344392301) OR Espaol: 1-855-Djelo-Ya (8-144-664-6430) o para ms informacin haga clic aqu or Text READY to 799-599 to register via text  Timeframe:  Short-Range Goal Priority:  High Start Date:   06/13/23              Expected End Date:  ongoing                     Follow Up Date--06/28/23 at 3:15 am  - keep 90 percent of scheduled appointments -consider counseling or psychiatry -consider bumping up your self-care  -consider creating a stronger support network   Why is this important?             Combating depression may take some time.            If you don't feel better right away, don't give up on your treatment plan.    Current barriers:   Chronic  Mental Health needs related to symptoms of depression, stress and anxiety. Patient requires Support, Education, Resources, Referrals, Advocacy, and Care Coordination, in order to meet Unmet Mental Health Needs and to find a therapist and psychiatrist. Mental Health Concerns and Social Isolation Mobility concerns and upcoming total knee replacement- [facility] RNCM referral placed today Patient lacks knowledge of local and available community counseling agencies and resources.  Clinical Goal(s): verbalize understanding of plan for management of Anxiety, Depression, and Stress symptoms and demonstrate a reduction in symptoms. Patient will connect with a provider for ongoing mental health treatment, increase coping skills, healthy habits, self-management skills, and stress reduction      Patient will implement clinical interventions discussed today to decrease symptoms of depression and increase knowledge and/or ability of: coping skills.   Patient Goals/Self-Care Activities: Take medications as prescribed   Attend all scheduled provider appointments Call pharmacy for medication refills 3-7 days in advance of running out of medications Perform all self care activities independently  Perform IADL's (shopping, preparing meals, housekeeping, managing finances) independently Call provider office for new concerns or questions Work with the social worker to address care coordination needs and will continue to work with the clinical team to address health care and disease management related needs call 1-800-273-TALK (toll free, 24 hour hotline) If in a crisis, CALL 988 or go to [facility] [address], [place] [phone])  Utilize healthy coping skills and supportive resources discussed Contact PCP with any questions or concerns Keep 90 percent of counseling appointments Call your insurance provider for more information about your Enhanced Benefits  Accept all calls  from representative with Behavioral Health Providers in an effort to establish ongoing mental health counseling and supportive services. Incorporate into daily practice - relaxation techniques, deep breathing exercises, and mindfulness meditation strategies. Talk about feelings with friends, family members, spiritual advisor, etc. Contact LCSW directly [phone]), if you have questions, need assistance, or if additional social work needs are identified between now and our next scheduled telephone outreach call. Call 988 for mental health hotline/crisis line if needed (24/7 available) Try techniques to reduce symptoms of anxiety/negative thinking (deep breathing, distraction, positive self talk, etc)  - develop a personal safety plan - exercise at least 2 to 3 times per week - have a plan for how to handle bad days - journal feelings and what helps to feel better or worse - spend time or talk with others at least 2 to 3 times per week - watch for early signs of feeling worse - begin personal counseling - call and visit an old friend - check out volunteer opportunities - join a support group - laugh; watch a funny movie or comedian - learn and use visualization or guided imagery - perform a random act of kindness - practice relaxation or meditation daily - start or continue a personal journal - practice positive thinking and self-talk -continue with compliance of taking medication  -identify current effective and ineffective coping strategies.  -implement positive self-talk in care to increase self-esteem, confidence and feelings of control.  -consider alternative and complementary therapy approaches such as meditation, mindfulness or yoga.  Call your insurance provider to gain education on benefits if desired Call your primary care doctor if symptoms get worse  -journaling, prayer, worship services, meditation or pastoral counseling.  -increase participation in pleasurable group  activities such as hobbies, singing, sports or volunteering).  -consider the use of meditative movement therapy such as tai chi or yoga  -start a regular daily exercise program based on tolerance, ability and patient choice to support positive thinking and activity    Follow Up Plan:  The patient has been provided with contact information for the care management team and has been advised to call with any mental health or health related questions or concerns.  The care management team will reach out to the patient again over the next 30 business  days.   If you are experiencing a Mental Health or Behavioral Health Crisis or need someone to talk to, please call the Suicide and Crisis Lifeline: 988    Patient Goals: Initial goal     24- Hour Availability:    [facility]  [address] [place], [place] [phone] Crisis [phone]   [facility] [phone]  [place] [facility]  [phone]    [facility]  [phone] (after hours)   Therapeutic Alternative/Mobile Crisis   [phone]   [place]  National Suicide Hotline  289-846-8697 MERRILYN) OR 988   Call 988 for mental health emergencies   [facility]  [phone]);  [place] and [employer]  [phone]); [place], [place], [place], [place], [place], [place], [place]    [facility] for [facility] Residents For 24/7 walk-up access to mental health services for [facility] children (4+), adolescents and adults, please  visit the [facility] located at [address] in [place], [place].  *[facility] also provides comprehensive outpatient behavioral health services in a variety of locations around the [place].  Connect With Us  [address] [place], [place] [address] HelpLine: [phone] or [phone]  Get Directions  Find Help  24/7 By Phone Call our 24-hour HelpLine at ([phone] or 616-786-4397 for immediate assistance for mental health and substance abuse issues.  Walk-In Help [place]: [facility] (Ages 4 and Up) [place]: Emergency Dept., [facility] Additional Resources National Hopeline Network: 1-800-SUICIDE The National Suicide Prevention Lifeline: 1-800-273-TALK     The following coping skill education was provided for stress relief and mental health management: When your car dies or a deadline looms, how do you respond? Long-term, low-grade or acute stress takes a serious toll on your body and mind, so don't ignore feelings of constant tension. Stress is a natural part of life. However, too much stress can harm our health, especially if it continues every day. This is chronic stress and can put you at risk for heart problems like heart disease and depression. Understand what's happening inside your body and learn simple coping skills to combat the negative impacts of everyday stressors.  Types of Stress There are two types of stress: Emotional - types of emotional stress are relationship problems, pressure at work, financial worries, experiencing discrimination or having a major life change. Physical - Examples of physical stress include being sick having pain, not sleeping well, recovery from an injury or having an alcohol  and drug use disorder. Fight or Flight Sudden or ongoing stress activates your nervous system and floods your bloodstream with adrenaline and cortisol, two hormones that raise blood pressure, increase heart rate and spike blood sugar. These changes pitch your body into a fight or flight response. That enabled our ancestors to outrun saber-toothed tigers, and it's helpful today for situations like dodging a car accident. But most modern chronic stressors, such as finances or a challenging relationship, keep your body in that  heightened state, which hurts your health. Effects of Too Much Stress If constantly under stress, most of us  will eventually start to function less well.  Multiple studies link chronic stress to a higher risk of heart disease, stroke, depression, weight gain, memory loss and even premature death, so it's important to recognize the warning signals. Talk to your doctor about ways to manage stress if you're experiencing any of these symptoms: Prolonged periods of poor sleep. Regular, severe headaches. Unexplained weight loss or gain. Feelings of isolation, withdrawal or worthlessness. Constant anger and irritability. Loss of interest in activities. Constant worrying or obsessive thinking. Excessive alcohol  or drug use. Inability to concentrate.  10 Ways to Cope with Chronic Stress It's key to recognize stressful situations as they occur because it allows you to focus on managing how you react. We all need to know when to close our eyes and take a deep breath when we feel tension rising. Use these tips to prevent or reduce chronic stress. 1. Rebalance Work and Home All work and no play? If you're spending too much time at the office, intentionally put more dates in your calendar to enjoy time for fun, either alone or with others. 2. Get Regular Exercise Moving your body on a regular basis balances the nervous system and increases blood circulation, helping to flush out stress hormones. Even a daily 20-minute walk makes a difference. Any kind of exercise  can lower stress and improve your mood ? just pick activities that you enjoy and make it a regular habit. 3. Eat Well and Limit Alcohol  and Stimulants Alcohol , nicotine and caffeine may temporarily relieve stress but have negative health impacts and can make stress worse in the long run. Well-nourished bodies cope better, so start with a good breakfast, add more organic fruits and vegetables for a well-balanced diet, avoid processed foods and sugar,  try herbal tea and drink more water . 4. Connect with Supportive People Talking face to face with another person releases hormones that reduce stress. Lean on those good listeners in your life. 5. Carve Out Hobby Time Do you enjoy gardening, reading, listening to music or some other creative pursuit? Engage in activities that bring you pleasure and joy; research shows that reduces stress by almost half and lowers your heart rate, too. 6. Practice Meditation, Stress Reduction or Yoga Relaxation techniques activate a state of restfulness that counterbalances your body's fight-or-flight hormones. Even if this also means a 10-minute break in a long day: listen to music, read, go for a walk in nature, do a hobby, take a bath or spend time with a friend. Also consider doing a mindfulness exercise or try a daily deep breathing or imagery practice. Deep Breathing Slow, calm and deep breathing can help you relax. Try these steps to focus on your breathing and repeat as needed. Find a comfortable position and close your eyes. Exhale and drop your shoulders. Breathe in through your nose; fill your lungs and then your belly. Think of relaxing your body, quieting your mind and becoming calm and peaceful. Breathe out slowly through your nose, relaxing your belly. Think of releasing tension, pain, worries or distress. Repeat steps three and four until you feel relaxed. Imagery This involves using your mind to excite the senses -- sound, vision, smell, taste and feeling. This may help ease your stress. Begin by getting comfortable and then do some slow breathing. Imagine a place you love being at. It could be somewhere from your childhood, somewhere you vacationed or just a place in your imagination. Feel how it is to be in the place you're imagining. Pay attention to the sounds, air, colors, and who is there with you. This is a place where you feel cared for and loved. All is well. You are safe. Take in all the  smells, sounds, tastes and feelings. As you do, feel your body being nourished and healed. Feel the calm that surrounds you. Breathe in all the good. Breathe out any discomfort or tension. 7. Sleep Enough If you get less than seven to eight hours of sleep, your body won't tolerate stress as well as it could. If stress keeps you up at night, address the cause, and add extra meditation into your day to make up for the lost z's. Try to get seven to nine hours of sleep each night. Make a regular bedtime schedule. Keep your room dark and cool. Try to avoid computers, TV, cell phones and tablets before bed. 8. Bond with Connections You Enjoy Go out for a coffee with a friend, chat with a neighbor, call a family member, visit with a clergy member, or even hang out with your pet. Clinical studies show that spending even a short time with a companion animal can cut anxiety levels almost in half. 9. Take a Vacation Getting away from it all can reset your stress tolerance by increasing your mental and emotional outlook, which makes you a  happier, more productive person upon return. Leave your cellphone and laptop at home! 10. See a Counselor, Coach or Therapist If negative thoughts overwhelm your ability to make positive changes, it's time to seek professional help. Make an appointment today--your health and life are worth it.    Lyle Rung, BSW, MSW, LCSW Licensed Clinical Social Worker [employer]   [facility] [email or web address] Direct Dial: [phone]

## 2023-06-20 ENCOUNTER — Other Ambulatory Visit: Payer: Self-pay | Admitting: Obstetrics and Gynecology

## 2023-06-20 NOTE — Patient Instructions (Signed)
Visit Information  Jean [name] was given information about Medicaid Managed Care team care coordination services as a part of their [facility] Community Plan Medicaid benefit. Jean [name] verbally consented to engagement with the [facility] Managed Care team.   If you are experiencing a medical emergency, please call 911 or report to your local emergency department or urgent care.   If you have a non-emergency medical problem during routine business hours, please contact your provider's office and ask to speak with a nurse.   For questions related to your [facility], please call: [phone] or visit the homepage here: [email or web address]  If you would like to schedule transportation through your [facility], please call the following number at least 2 days in advance of your appointment: [phone]   Rides for urgent appointments can also be made after hours by calling Member Services.  Call the Behavioral Health Crisis Line at [phone], at any time, 24 hours a day, 7 days a week. If you are in danger or need immediate medical attention call 911.  If you would like help to quit smoking, call 1-800-QUIT-NOW (812-843-2721) OR Espaol: 1-855-Djelo-Ya (3-474-259-5638) o para ms informacin haga clic aqu or Text READY to 756-433 to register via text  Jean [name] - following are the goals we discussed in your visit today:   Goals Addressed    None Timeframe:  Long-Range Goal Priority:  High Start Date: 06/20/23                            Expected End Date:   ongoing                    Follow Up Date 07/18/23    - practice safe sex - schedule appointment for vaccines needed due to my age or health - schedule recommended health tests (blood work, mammogram, colonoscopy, pap test) - schedule and keep appointment for annual check-up    Why is this important?   Screening  tests can find diseases early when they are easier to treat.  Your doctor or nurse will talk with you about which tests are important for you.  Getting shots for common diseases like the flu and shingles will help prevent them.    Patient verbalizes understanding of instructions and care plan provided today and agrees to view in MyChart. Active MyChart status and patient understanding of how to access instructions and care plan via MyChart confirmed with patient.     The Managed Medicaid care management team will reach out to the patient again over the next 30 business  days.  The  Patient  has been provided with contact information for the Managed Medicaid care management team and has been advised to call with any health related questions or concerns.   Kathi Der RN, BSN, [employer] [facility] RN Care Manager Direct Dial [phone]/AYT [phone] Website: [email or web address]    Following is a copy of your plan of care:  Care Plan : RN Care Manager Plan of Care  Updates made by Danie Chandler, RN since 06/20/2023 12:00 AM     Problem: Health Promotion or Disease Self-Management (General Plan of Care)      Long-Range Goal: Chronic Disease Management and Care Coordination Needs   Start Date: 06/20/2023  Expected End Date: 09/17/2023  Priority: High  Note:   Current Barriers:  Knowledge Deficits related to plan of  care for management of HTN, COPD, dysphagia, GERD, osteoarthritis, CKD, chronic pain syndrome, MDD Care Coordination needs related to food resources Chronic Disease Management support and education needs related to HTN, COPD, dysphagia, GERD, osteoarthritis, CKD, chronic pain syndrome, MDD Financial Constraints  06/20/23: Patient to check with provider regarding shower/tub bench.  Currently taking sponge baths-has someone that assists when available.  BSW referral / collaboration for food resources.  Takes BP when she feels like it-no readings today.   No breathing difficulty.    RNCM Clinical Goal(s):  verbalize understanding of plan for management of HTN, COPD, dysphagia, GERD, osteoarthritis, CKD, chronic pain syndrome, MDD as evidenced by patient report verbalize basic understanding of  HTN, COPD, dysphagia, GERD, osteoarthritis, CKD, chronic pain syndrome, MDD disease process and self health management plan as evidenced by patient report take all medications exactly as prescribed and will call provider for medication related questions as evidenced by patient report demonstrate understanding of rationale for each prescribed medication as evidenced by patient report attend all scheduled medical appointments:as evidenced by patient report and EMR review demonstrate ongoing adherence to prescribed treatment plan for HTN, COPD, dysphagia, GERD, osteoarthritis, CKD, chronic pain syndrome, MDD  as evidenced by patient report and EMR review continue to work with RN Care Manager to address care management and care coordination needs related to  HTN, COPD, dysphagia, GERD, osteoarthritis, CKD, chronic pain syndrome, MDD as evidenced by adherence to CM Team Scheduled appointments work with Child psychotherapist to address anxiety, depression, resources  related to the management of HTN, COPD, dysphagia, GERD, osteoarthritis, CKD, chronic pain syndrome, MDD   as evidenced by review of EMR and patient or Child psychotherapist report through collaboration with Medical illustrator, provider, and care team.   Interventions: Evaluation of current treatment plan related to  self management and patient's adherence to plan as established by provider 06/30/23:  Collaborated with BSW, BSW referral for food resources   Chronic Kidney Disease Interventions:  (Status:  New goal.) Long Term Goal Evaluation of current treatment plan related to chronic kidney disease self management and patient's adherence to plan as established by provider      Reviewed medications with patient and  discussed importance of compliance    Discussed complications of poorly controlled blood pressure such as heart disease, stroke, circulatory complications, vision complications, kidney impairment, sexual dysfunction    Reviewed scheduled/upcoming provider appointments including    Discussed plans with patient for ongoing care management follow up and provided patient with direct contact information for care management team    Assessed social determinant of health barriers    Last practice recorded BP readings:  BP Readings from Last 3 Encounters:  06/05/23 120/74  04/23/23 (!) 150/100  02/17/23 (!) 161/83   Most recent eGFR/CrCl:  Lab Results  Component Value Date   EGFR 42 (L) 11/29/2022    No components found for: "CRCL"  COPD Interventions:  (Status:  New goal.) Long Term Goal Advised patient to track and manage COPD triggers Advised patient to self assesses COPD action plan zone and make appointment with provider if in the yellow zone for 48 hours without improvement Provided education about and advised patient to utilize infection prevention strategies to reduce risk of respiratory infection Discussed the importance of adequate rest and management of fatigue with COPD Referral made to BSW  for assistance withf ood resources Assessed social determinant of health barriers  Hypertension Interventions:  (Status:  New goal.) Long Term Goal Last practice recorded BP readings:  BP Readings from Last 3 Encounters:  06/05/23 120/74  04/23/23 (!) 150/100  02/17/23 (!) 161/83   Most recent eGFR/CrCl:  Lab Results  Component Value Date   EGFR 42 (L) 11/29/2022    No components found for: "CRCL"  Evaluation of current treatment plan related to hypertension self management and patient's adherence to plan as established by provider Reviewed medications with patient and discussed importance of compliance Discussed plans with patient for ongoing care management follow up and provided  patient with direct contact information for care management team Advised patient, providing education and rationale, to monitor blood pressure daily and record, calling PCP for findings outside established parameters Reviewed scheduled/upcoming provider appointments  Discussed complications of poorly controlled blood pressure such as heart disease, stroke, circulatory complications, vision complications, kidney impairment, sexual dysfunction Assessed social determinant of health barriers  Pain Interventions:  (Status:  New goal.) Long Term Goal Pain assessment performed Medications reviewed Reviewed provider established plan for pain management Discussed importance of adherence to all scheduled medical appointments Counseled on the importance of reporting any/all new or changed pain symptoms or management strategies to pain management provider Advised patient to report to care team affect of pain on daily activities Reviewed with patient prescribed pharmacological and nonpharmacological pain relief strategies Assessed social determinant of health barriers  SDOH Barriers (Status:  New goal.) Long Term Goal Patient interviewed and SDOH assessment performed        SDOH Interventions    Flowsheet Row Patient Outreach Telephone from 06/20/2023 in [place] POPULATION HEALTH DEPARTMENT Patient Outreach Telephone from 06/13/2023 in [place] HEALTH POPULATION HEALTH DEPARTMENT Office Visit from 06/05/2023 in [facility] Dept Of [place]. [facility] Office Visit from 05/04/2021 in [facility] - A Dept Of [place]. [facility] Office Visit from 10/28/2019 in [facility] - A Dept Of [place]. [facility] Office Visit from 04/24/2017 in [facility] - A Dept Of [place]. [facility]  SDOH Interventions        Food Insecurity Interventions -- Community Resources Provided Intervention Not Indicated -- --  --  Housing Interventions -- Intervention Not Indicated Intervention Not Indicated -- -- --  Transportation Interventions -- -- Intervention Not Indicated -- -- --  Utilities Interventions -- Intervention Not Indicated Intervention Not Indicated -- -- --  Alcohol Usage Interventions Intervention Not Indicated (Score <7) -- -- -- -- --  Depression Interventions/Treatment  -- -- Medication Medication  [PATIENT STATES SHE ON MEDICATION] Medication Medication  Financial Strain Interventions -- [employer] Provided Other (Comment)  [need referral to social worker] -- -- --  Stress Interventions -- [employer] Provided, Provide Counseling Intervention Not Indicated -- -- --  Social Connections Interventions -- -- Intervention Not Indicated -- -- --  Health Literacy Interventions Intervention Not Indicated -- -- -- -- --      Referred patient to community resources care guide team for assistance with food resources Discussed plans with patient for ongoing care management follow up and provided patient with direct contact information for care management team  Surgery (right knee arthroplasty 2/25):  (Status: New goal.) Short Term Goal Evaluation of current treatment plan related to right knee arthroplasty 2/25 surgery assessed patient/caregiver understanding of surgical procedure   reviewed medications with patient and addressed questions reviewed scheduled provider appointments with patient confirmed availability of transportation to all appointments referred to social work to address  anxiety/depression/food resources  Patient Goals/Self-Care Activities: Take all medications as prescribed Attend all scheduled provider appointments Call pharmacy for medication refills 3-7 days in advance of running out of medications Perform all self care activities independently  Perform IADL's (shopping, preparing meals, housekeeping, managing finances) independently Call provider office for  new concerns or questions  Work with the social worker to address care coordination needs and will continue to work with the clinical team to address health care and disease management related needs  Follow Up Plan:  The patient has been provided with contact information for the care management team and has been advised to call with any health related questions or concerns.  The care management team will reach out to the patient again over the next 30 business days.

## 2023-06-20 NOTE — Patient Outreach (Signed)
Medicaid Managed Care   Nurse Care Manager Note  06/20/2023 Name:  Jean [name] MRN:  [id number] DOB:  1965/09/01  Jean [name] is an 58 y.o. year old female who is a primary patient of Jean James, DO.  The Jean [name] Managed Care Coordination team was consulted for assistance with:    Chronic healthcare management needs,  HTN, COPD, dysphagia, GERD, osteoarthritis, CKD, chronic pain syndrome, MDD  Jean [name] was given information about Medicaid Managed Care Coordination team services today. Jean [name] Patient agreed to services and verbal consent obtained.  Engaged with patient by telephone for initial visit in response to provider referral for case management and/or care coordination services.   Patient is participating in a Managed Medicaid Plan:  Yes  Assessments/Interventions:  Review of past medical history, allergies, medications, health status, including review of consultants reports, laboratory and other test data, was performed as part of comprehensive evaluation and provision of chronic care management services.  SDOH (Social Drivers of Health) assessments and interventions performed: SDOH Interventions    Flowsheet Row Patient Outreach Telephone from 06/20/2023 in [place] [facility]PART[facility] Patient Outreach Telephone from 06/13/2023 in [place] [facility]PART[facility] Office Visit from 06/05/2023 in [name] - A Dept Of [facility] [place]. [facility] Office Visit from 05/04/2021 in [name] - A Dept Of [place]. [facility] Office Visit from 10/28/2019 in [name] - A Dept Of [place]. [name] [place] Office Visit from 04/24/2017 in [facility] - A Dept Of [place]. [facility]  SDOH Interventions        Food Insecurity Interventions -- Community Resources Provided Intervention Not Indicated -- -- --  Housing Interventions -- Intervention Not  Indicated Intervention Not Indicated -- -- --  Transportation Interventions -- -- Intervention Not Indicated -- -- --  Utilities Interventions -- Intervention Not Indicated Intervention Not Indicated -- -- --  Alcohol Usage Interventions Intervention Not Indicated (Score <7) -- -- -- -- --  Depression Interventions/Treatment  -- -- Medication Medication  [PATIENT STATES SHE ON MEDICATION] Medication Medication  Financial Strain Interventions -- [employer] Provided Other (Comment)  [need referral to social worker] -- -- --  Stress Interventions -- [employer] Provided, Provide Counseling Intervention Not Indicated -- -- --  Social Connections Interventions -- -- Intervention Not Indicated -- -- --  Health Literacy Interventions Intervention Not Indicated -- -- -- -- --     Care Plan Allergies  Allergen Reactions   Lisinopril Swelling and Other (See Comments)    Lip swelling, patient reported - unable to confirm from chart    Medications Reviewed Today     Reviewed by Danie Chandler, RN (Registered Nurse) on 06/20/23 at 1308  Med List Status: <None>   Medication Order Taking? Sig Documenting Provider Last Dose Status Informant  0.9 %  sodium chloride infusion [id number]   Rachael Fee, MD  Active   acetaminophen (TYLENOL) 500 MG tablet [id number] No Take 500 mg by mouth every 6 (six) hours as needed for moderate pain. [provider] Taking Active   albuterol (VENTOLIN HFA) 108 (90 Base) MCG/ACT inhaler [id number] No Inhale 2 puffs into the lungs every 4 (four) hours as needed for wheezing or shortness of breath. Hunsucker, Lesia Sago, MD Taking Active   amLODipine (NORVASC) 10 MG tablet [id number] No Take 1 tablet (10 mg total) by mouth daily. Jari Favre  N, PA-C Taking Expired 04/23/23 2359   aspirin 81 MG chewable tablet [id number] No Chew 1 tablet (81 mg total) by mouth daily. Sharlene Dory, PA-C Taking Active   atorvastatin (LIPITOR) 40 MG tablet [id number] No  Take 1 tablet (40 mg total) by mouth daily. Sharlene Dory, [place] Taking Expired 04/23/23 2359   Budeson-Glycopyrrol-Formoterol (BREZTRI AEROSPHERE) 160-9-4.8 MCG/ACT AERO [id number] No Inhale 2 puffs into the lungs in the morning and at bedtime. Hunsucker, Lesia Sago, MD Taking Active   budesonide-formoterol [name]) 160-4.5 MCG/ACT inhaler [id number] No Inhale 2 puffs into the lungs 2 (two) times daily. Hunsucker, Lesia Sago, MD Taking Active   cyclobenzaprine (FLEXERIL) 10 MG tablet [id number] No TAKE 1 TABLET BY MOUTH TWICE A DAY AS NEEDED FOR MUSCLE SPASMS Kathryne Hitch, MD Taking Active   famotidine (PEPCID) 20 MG tablet [id number] No TAKE 1 TABLET BY MOUTH TWICE A DAY Jean James, DO Taking Active   gabapentin (NEURONTIN) 300 MG capsule [id number] No TAKE 1 CAPSULE BY MOUTH EVERYDAY AT BEDTIME Kathryne Hitch, MD Taking Active   metoprolol tartrate (LOPRESSOR) 25 MG tablet [id number] No Take 0.5 tablets (12.5 mg total) by mouth 2 (two) times daily. Sharlene Dory, [place] Taking Expired 04/23/23 2359   pantoprazole (PROTONIX) 40 MG tablet [id number] No Take 1 tablet (40 mg total) by mouth daily. Jean James, DO Taking Active   sertraline (ZOLOFT) 25 MG tablet [id number]  Take 1 tablet (25 mg total) by mouth daily. Modena Slater, DO  Active   spironolactone (ALDACTONE) 25 MG tablet [id number] No Take 1 tablet (25 mg total) by mouth daily. Nahser, Deloris Ping, MD Taking Active   tetrahydrozoline 0.05 % ophthalmic solution [id number] No Place 1 drop into both eyes daily as needed (for dry eyes).  [provider] Taking Active Self  traMADol (ULTRAM) 50 MG tablet [id number]  Take 1 tablet (50 mg total) by mouth every 6 (six) hours as needed. Kirtland Bouchard, PA-C  Active            Patient Active Problem List   Diagnosis Date Noted   Osteoarthritis of right knee 06/05/2023   Food insecurity 06/05/2023   Bilateral shoulder pain 11/29/2022   Dysphagia 11/29/2022    History of cardioembolic cerebrovascular accident (CVA) 11/29/2022   Dizziness 06/19/2022   Constipation 05/09/2021   Weight loss 05/09/2021   Headache    Symptomatic bradycardia 04/18/2021   Near syncope 04/18/2021   Synovitis of left knee 07/26/2020   COVID-19 virus infection 05/11/2020   Low back pain 11/24/2019   Impingement syndrome of right shoulder 04/22/2019   History of dental surgery 02/05/2019   COPD GOLD 0 ? 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   GERD (gastroesophageal reflux disease)    Breast pain 06/23/2018   Dyspareunia in female 09/13/2017   Cervicalgia 04/24/2017   Chronic kidney disease, stage 3a (HCC) 10/29/2016   Chronic pain syndrome 05/10/2016   MDD (major depressive disorder) 10/19/2015   Dyspnea on exertion 08/22/2015   HTN (hypertension) 07/08/2015   Routine health maintenance 07/08/2015   Conditions to be addressed/monitored per PCP order:  HTN, COPD, dysphagia, GERD, osteoarthritis, CKD, chronic pain syndrome, MDD  Care Plan : RN Care Manager Plan of Care  Updates made by Danie Chandler, RN since 06/20/2023 12:00 AM     Problem: Health Promotion or Disease Self-Management (General Plan of Care)      Long-Range Goal: Chronic Disease Management  and Care Coordination Needs   Start Date: 06/20/2023  Expected End Date: 09/17/2023  Priority: High  Note:   Current Barriers:  Knowledge Deficits related to plan of care for management of HTN, COPD, dysphagia, GERD, osteoarthritis, CKD, chronic pain syndrome, MDD Care Coordination needs related to food resources Chronic Disease Management support and education needs related to HTN, COPD, dysphagia, GERD, osteoarthritis, CKD, chronic pain syndrome, MDD Financial Constraints  06/20/23: Patient to check with provider regarding shower/tub bench.  Currently taking sponge baths-has someone that assists when available.  BSW referral / collaboration for food resources.   Takes BP when she feels like it-no readings today.  No breathing difficulty.    RNCM Clinical Goal(s):  verbalize understanding of plan for management of HTN, COPD, dysphagia, GERD, osteoarthritis, CKD, chronic pain syndrome, MDD as evidenced by patient report verbalize basic understanding of  HTN, COPD, dysphagia, GERD, osteoarthritis, CKD, chronic pain syndrome, MDD disease process and self health management plan as evidenced by patient report take all medications exactly as prescribed and will call provider for medication related questions as evidenced by patient report demonstrate understanding of rationale for each prescribed medication as evidenced by patient report attend all scheduled medical appointments:as evidenced by patient report and EMR review demonstrate ongoing adherence to prescribed treatment plan for HTN, COPD, dysphagia, GERD, osteoarthritis, CKD, chronic pain syndrome, MDD  as evidenced by patient report and EMR review continue to work with RN Care Manager to address care management and care coordination needs related to  HTN, COPD, dysphagia, GERD, osteoarthritis, CKD, chronic pain syndrome, MDD as evidenced by adherence to CM Team Scheduled appointments work with Child psychotherapist to address anxiety, depression, resources  related to the management of HTN, COPD, dysphagia, GERD, osteoarthritis, CKD, chronic pain syndrome, MDD   as evidenced by review of EMR and patient or Child psychotherapist report through collaboration with Medical illustrator, provider, and care team.   Interventions: Evaluation of current treatment plan related to  self management and patient's adherence to plan as established by provider 06/30/23:  Collaborated with BSW, BSW referral for food resources   Chronic Kidney Disease Interventions:  (Status:  New goal.) Long Term Goal Evaluation of current treatment plan related to chronic kidney disease self management and patient's adherence to plan as established by  provider      Reviewed medications with patient and discussed importance of compliance    Discussed complications of poorly controlled blood pressure such as heart disease, stroke, circulatory complications, vision complications, kidney impairment, sexual dysfunction    Reviewed scheduled/upcoming provider appointments including    Discussed plans with patient for ongoing care management follow up and provided patient with direct contact information for care management team    Assessed social determinant of health barriers    Last practice recorded BP readings:  BP Readings from Last 3 Encounters:  06/05/23 120/74  04/23/23 (!) 150/100  02/17/23 (!) 161/83   Most recent eGFR/CrCl:  Lab Results  Component Value Date   EGFR 42 (L) 11/29/2022    No components found for: "CRCL"  COPD Interventions:  (Status:  New goal.) Long Term Goal Advised patient to track and manage COPD triggers Advised patient to self assesses COPD action plan zone and make appointment with provider if in the yellow zone for 48 hours without improvement Provided education about and advised patient to utilize infection prevention strategies to reduce risk of respiratory infection Discussed the importance of adequate rest and management of fatigue with COPD Referral made  to BSW for assistance with food resources Assessed social determinant of health barriers  Hypertension Interventions:  (Status:  New goal.) Long Term Goal Last practice recorded BP readings:  BP Readings from Last 3 Encounters:  06/05/23 120/74  04/23/23 (!) 150/100  02/17/23 (!) 161/83   Most recent eGFR/CrCl:  Lab Results  Component Value Date   EGFR 42 (L) 11/29/2022    No components found for: "CRCL"  Evaluation of current treatment plan related to hypertension self management and patient's adherence to plan as established by provider Reviewed medications with patient and discussed importance of compliance Discussed plans with patient for  ongoing care management follow up and provided patient with direct contact information for care management team Advised patient, providing education and rationale, to monitor blood pressure daily and record, calling PCP for findings outside established parameters Reviewed scheduled/upcoming provider appointments  Discussed complications of poorly controlled blood pressure such as heart disease, stroke, circulatory complications, vision complications, kidney impairment, sexual dysfunction Assessed social determinant of health barriers  Pain Interventions:  (Status:  New goal.) Long Term Goal Pain assessment performed Medications reviewed Reviewed provider established plan for pain management Discussed importance of adherence to all scheduled medical appointments Counseled on the importance of reporting any/all new or changed pain symptoms or management strategies to pain management provider Advised patient to report to care team affect of pain on daily activities Reviewed with patient prescribed pharmacological and nonpharmacological pain relief strategies Assessed social determinant of health barriers  SDOH Barriers (Status:  New goal.) Long Term Goal Patient interviewed and SDOH assessment performed        SDOH Interventions    Flowsheet Row Patient Outreach Telephone from 06/20/2023 in [place] POP[facility]ATION HEALTH DEPARTMENT Patient Outreach Telephone from 06/13/2023 in [place] [facility] POPULATION HEALTH DEPARTMENT Office Visit from 06/05/2023 in [name] - [facility] Dept Of [place]. [name] Office Visit from 05/04/2021 in [facility] - A Dept Of [place]. [name] Office Visit from 10/28/2019 in [facility] - A Dept Of [place]. [facility] Office Visit from 04/24/2017 in [name] - A Dept Of [place]. [facility]  SDOH Interventions        Food Insecurity Interventions -- Community  Resources Provided Intervention Not Indicated -- -- --  Housing Interventions -- Intervention Not Indicated Intervention Not Indicated -- -- --  Transportation Interventions -- -- Intervention Not Indicated -- -- --  Utilities Interventions -- Intervention Not Indicated Intervention Not Indicated -- -- --  Alcohol Usage Interventions Intervention Not Indicated (Score <7) -- -- -- -- --  Depression Interventions/Treatment  -- -- Medication Medication  [PATIENT STATES SHE ON MEDICATION] Medication Medication  Financial Strain Interventions -- [employer] Provided Other (Comment)  [need referral to social worker] -- -- --  Stress Interventions -- [employer] Provided, Provide Counseling Intervention Not Indicated -- -- --  Social Connections Interventions -- -- Intervention Not Indicated -- -- --  Health Literacy Interventions Intervention Not Indicated -- -- -- -- --      Referred patient to community resources care guide team for assistance with food resources Discussed plans with patient for ongoing care management follow up and provided patient with direct contact information for care management team  Surgery (right knee arthroplasty 2/25):  (Status: New goal.) Short Term Goal Evaluation of current treatment plan related to right knee arthroplasty 2/25 surgery assessed patient/caregiver understanding of  surgical procedure   reviewed medications with patient and addressed questions reviewed scheduled provider appointments with patient confirmed availability of transportation to all appointments referred to social work to address anxiety/depression/food resources  Patient Goals/Self-Care Activities: Take all medications as prescribed Attend all scheduled provider appointments Call pharmacy for medication refills 3-7 days in advance of running out of medications Perform all self care activities independently  Perform IADL's (shopping, preparing meals, housekeeping, managing  finances) independently Call provider office for new concerns or questions  Work with the social worker to address care coordination needs and will continue to work with the clinical team to address health care and disease management related needs  Follow Up Plan:  The patient has been provided with contact information for the care management team and has been advised to call with any health related questions or concerns.  The care management team will reach out to the patient again over the next 30 business days.   Long-Range Goal: Establish Plan of Care for Chronic Disease Management Needs and SDOH Barriers   Priority: High  Note:   Timeframe:  Long-Range Goal Priority:  High Start Date: 06/20/23                            Expected End Date:   ongoing                    Follow Up Date 07/18/23    - practice safe sex - schedule appointment for vaccines needed due to my age or health - schedule recommended health tests (blood work, mammogram, colonoscopy, pap test) - schedule and keep appointment for annual check-up    Why is this important?   Screening tests can find diseases early when they are easier to treat.  Your doctor or nurse will talk with you about which tests are important for you.  Getting shots for common diseases like the flu and shingles will help prevent them.     Follow Up:  Patient agrees to Care Plan and Follow-up.  Plan: The Managed Medicaid care management team will reach out to the patient again over the next 30 business  days. and The  Patient has been provided with contact information for the Managed Medicaid care management team and has been advised to call with any health related questions or concerns.  Date/time of next scheduled RN care management/care coordination outreach: 07/18/23 at 230

## 2023-06-25 NOTE — Progress Notes (Addendum)
Surgical Instructions   Your procedure is scheduled on Tuesday, February 25, 25. Report to [facility] Main Entrance "A" at 5:30 A.M., then check in with the Admitting office. Any questions or running late day of surgery: call [phone]  Questions prior to your surgery date: call [phone], Monday-Friday, 8am-4pm. If you experience any cold or flu symptoms such as cough, fever, chills, shortness of breath, etc. between now and your scheduled surgery, please notify us at the above number.     Remember:  Do not eat after midnight the night before your surgery  You may drink clear liquids until 4:30 the morning of your surgery.   Clear liquids allowed are: Water, Non-Citrus Juices (without pulp), Carbonated Beverages, Clear Tea (no milk, honey, etc.), Black Coffee Only (NO MILK, CREAM OR POWDERED CREAMER of any kind), and Gatorade.    Take these medicines the morning of surgery with A SIP OF WATER  amLODipine (NORVASC)  atorvastatin (LIPITOR)  Budeson-Glycopyrrol-Formoterol (BREZTRI AEROSPHERE) inhaler  budesonide-formoterol (SYMBICORT) inhaler  gabapentin (NEURONTIN)  metoprolol tartrate (LOPRESSOR)  pantoprazole (PROTONIX)  sertraline (ZOLOFT)    May take these medicines IF NEEDED: acetaminophen (TYLENOL)  albuterol (VENTOLIN HFA) inhaler - please bring on the day of surgery  cyclobenzaprine (FLEXERIL)  traMADol (ULTRAM)   Follow your surgeon's instructions on when to stop Asprin.  If no instructions were given by your surgeon then you will need to call the office to get those instructions.     One week prior to surgery, STOP taking any Aleve, Naproxen, Ibuprofen, Motrin, Advil, Goody's, BC's, all herbal medications, fish oil, and non-prescription vitamins.                     Do NOT Smoke (Tobacco/Vaping) for 24 hours prior to your procedure.  If you use a CPAP at night, you may bring your mask/headgear for your overnight stay.   You will be asked to remove any  contacts, glasses, piercing's, hearing aid's, dentures/partials prior to surgery. Please bring cases for these items if needed.    Patients discharged the day of surgery will not be allowed to drive home, and someone needs to stay with them for 24 hours.  SURGICAL WAITING ROOM VISITATION Patients may have no more than 2 support people in the waiting area - these visitors may rotate.   Pre-op nurse will coordinate an appropriate time for 1 ADULT support person, who may not rotate, to accompany patient in pre-op.  Children under the age of 76 must have an adult with them who is not the patient and must remain in the main waiting area with an adult.  If the patient needs to stay at the hospital during part of their recovery, the visitor guidelines for inpatient rooms apply.  Please refer to the [facility] website for the visitor guidelines for any additional information.   If you received a COVID test during your pre-op visit  it is requested that you wear a mask when out in public, stay away from anyone that may not be feeling well and notify your surgeon if you develop symptoms. If you have been in contact with anyone that has tested positive in the last 10 days please notify you surgeon.      Pre-operative 5 CHG Bathing Instructions   You can play a key role in reducing the risk of infection after surgery. Your skin needs to be as free of germs as possible. You can reduce the number of germs on your skin  by washing with CHG (chlorhexidine gluconate) soap before surgery. CHG is an antiseptic soap that kills germs and continues to kill germs even after washing.   DO NOT use if you have an allergy to chlorhexidine/CHG or antibacterial soaps. If your skin becomes reddened or irritated, stop using the CHG and notify one of our RNs at [phone].   Please shower with the CHG soap starting 4 days before surgery using the following schedule:     Please keep in mind the following:  DO NOT  shave, including legs and underarms, starting the day of your first shower.   You may shave your face at any point before/day of surgery.  Place clean sheets on your bed the day you start using CHG soap. Use a clean washcloth (not used since being washed) for each shower. DO NOT sleep with pets once you start using the CHG.   CHG Shower Instructions:  Wash your face and private area with normal soap. If you choose to wash your hair, wash first with your normal shampoo.  After you use shampoo/soap, rinse your hair and body thoroughly to remove shampoo/soap residue.  Turn the water OFF and apply about 3 tablespoons (45 ml) of CHG soap to a CLEAN washcloth.  Apply CHG soap ONLY FROM YOUR NECK DOWN TO YOUR TOES (washing for 3-5 minutes)  DO NOT use CHG soap on face, private areas, open wounds, or sores.  Pay special attention to the area where your surgery is being performed.  If you are having back surgery, having someone wash your back for you may be helpful. Wait 2 minutes after CHG soap is applied, then you may rinse off the CHG soap.  Pat dry with a clean towel  Put on clean clothes/pajamas   If you choose to wear lotion, please use ONLY the CHG-compatible lotions that are listed below.  Additional instructions for the day of surgery: DO NOT APPLY any lotions, deodorants, cologne, or perfumes.   Do not bring valuables to the hospital. [facility] is not responsible for any belongings/valuables. Do not wear nail polish, gel polish, artificial nails, or any other type of covering on natural nails (fingers and toes) Do not wear jewelry or makeup Put on clean/comfortable clothes.  Please brush your teeth.  Ask your nurse before applying any prescription medications to the skin.     CHG Compatible Lotions   Aveeno Moisturizing lotion  Cetaphil Moisturizing Cream  Cetaphil Moisturizing Lotion  Clairol Herbal Essence Moisturizing Lotion, Dry Skin  Clairol Herbal Essence Moisturizing  Lotion, Extra Dry Skin  Clairol Herbal Essence Moisturizing Lotion, Normal Skin  Curel Age Defying Therapeutic Moisturizing Lotion with Alpha Hydroxy  Curel Extreme Care Body Lotion  Curel Soothing Hands Moisturizing Hand Lotion  Curel Therapeutic Moisturizing Cream, Fragrance-Free  Curel Therapeutic Moisturizing Lotion, Fragrance-Free  Curel Therapeutic Moisturizing Lotion, Original Formula  Eucerin Daily Replenishing Lotion  Eucerin Dry Skin Therapy Plus Alpha Hydroxy Crme  Eucerin Dry Skin Therapy Plus Alpha Hydroxy Lotion  Eucerin Original Crme  Eucerin Original Lotion  Eucerin Plus Crme Eucerin Plus Lotion  Eucerin TriLipid Replenishing Lotion  Keri Anti-Bacterial Hand Lotion  Keri Deep Conditioning Original Lotion Dry Skin Formula Softly Scented  Keri Deep Conditioning Original Lotion, Fragrance Free Sensitive Skin Formula  Keri Lotion Fast Absorbing Fragrance Free Sensitive Skin Formula  Keri Lotion Fast Absorbing Softly Scented Dry Skin Formula  Keri Original Lotion  Keri Skin Renewal Lotion Keri Silky Smooth Lotion  Keri Silky Smooth Sensitive Skin Lotion  Nivea Body Creamy Conditioning Oil  Nivea Body Extra Enriched Lotion  Nivea Body Original Lotion  Nivea Body Sheer Moisturizing Lotion Nivea Crme  Nivea Skin Firming Lotion  NutraDerm 30 Skin Lotion  NutraDerm Skin Lotion  NutraDerm Therapeutic Skin Cream  NutraDerm Therapeutic Skin Lotion  ProShield Protective Hand Cream  Provon moisturizing lotion  Please read over the following fact sheets that you were given.

## 2023-06-26 ENCOUNTER — Inpatient Hospital Stay (HOSPITAL_COMMUNITY)
Admission: RE | Admit: 2023-06-26 | Discharge: 2023-06-26 | Disposition: A | Discharge status: Home / Self Care | Payer: Medicaid Other | Source: Ambulatory Visit

## 2023-06-28 ENCOUNTER — Other Ambulatory Visit: Payer: Self-pay | Admitting: Licensed Clinical Social Worker

## 2023-06-28 ENCOUNTER — Encounter (HOSPITAL_COMMUNITY)
Admission: RE | Admit: 2023-06-28 | Discharge: 2023-06-28 | Disposition: A | Discharge status: Home / Self Care | Payer: Medicaid Other | Source: Ambulatory Visit | Attending: Orthopaedic Surgery | Admitting: Orthopaedic Surgery

## 2023-06-28 ENCOUNTER — Other Ambulatory Visit: Payer: Self-pay

## 2023-06-28 ENCOUNTER — Encounter (HOSPITAL_COMMUNITY): Payer: Self-pay

## 2023-06-28 VITALS — BP 141/91 | HR 73 | Temp 98.0°F | Resp 17 | Ht 64.0 in | Wt 133.7 lb

## 2023-06-28 DIAGNOSIS — Z87891 Personal history of nicotine dependence: Secondary | ICD-10-CM | POA: Insufficient documentation

## 2023-06-28 DIAGNOSIS — M1711 Unilateral primary osteoarthritis, right knee: Secondary | ICD-10-CM | POA: Insufficient documentation

## 2023-06-28 DIAGNOSIS — I129 Hypertensive chronic kidney disease with stage 1 through stage 4 chronic kidney disease, or unspecified chronic kidney disease: Secondary | ICD-10-CM | POA: Diagnosis not present

## 2023-06-28 DIAGNOSIS — N183 Chronic kidney disease, stage 3 unspecified: Secondary | ICD-10-CM | POA: Insufficient documentation

## 2023-06-28 DIAGNOSIS — Z01818 Encounter for other preprocedural examination: Secondary | ICD-10-CM

## 2023-06-28 DIAGNOSIS — Z79899 Other long term (current) drug therapy: Secondary | ICD-10-CM | POA: Diagnosis not present

## 2023-06-28 DIAGNOSIS — K219 Gastro-esophageal reflux disease without esophagitis: Secondary | ICD-10-CM | POA: Insufficient documentation

## 2023-06-28 DIAGNOSIS — J449 Chronic obstructive pulmonary disease, unspecified: Secondary | ICD-10-CM | POA: Diagnosis not present

## 2023-06-28 DIAGNOSIS — Z01812 Encounter for preprocedural laboratory examination: Secondary | ICD-10-CM | POA: Diagnosis present

## 2023-06-28 DIAGNOSIS — E785 Hyperlipidemia, unspecified: Secondary | ICD-10-CM | POA: Insufficient documentation

## 2023-06-28 HISTORY — DX: Cardiac arrhythmia, unspecified: I49.9

## 2023-06-28 HISTORY — DX: Other specified postprocedural states: Z98.890

## 2023-06-28 HISTORY — DX: Other specified postprocedural states: R11.2

## 2023-06-28 LAB — BASIC METABOLIC PANEL
Anion gap: 11 (ref 5–15)
BUN: 17 mg/dL (ref 6–20)
CO2: 24 mmol/L (ref 22–32)
Calcium: 10.3 mg/dL (ref 8.9–10.3)
Chloride: 104 mmol/L (ref 98–111)
Creatinine, Ser: 1.32 mg/dL — ABNORMAL HIGH (ref 0.44–1.00)
GFR, Estimated: 46 mL/min — ABNORMAL LOW (ref >60)
Glucose, Bld: 80 mg/dL (ref 70–99)
Potassium: 5 mmol/L (ref 3.5–5.1)
Sodium: 139 mmol/L (ref 135–145)

## 2023-06-28 LAB — CBC
HCT: 40.6 % (ref 36.0–46.0)
Hemoglobin: 13 g/dL (ref 12.0–15.0)
MCH: 31.6 pg (ref 26.0–34.0)
MCHC: 32 g/dL (ref 30.0–36.0)
MCV: 98.8 fL (ref 80.0–100.0)
Platelets: 328 10*3/uL (ref 150–400)
RBC: 4.11 MIL/uL (ref 3.87–5.11)
RDW: 13.1 % (ref 11.5–15.5)
WBC: 5.8 10*3/uL (ref 4.0–10.5)
nRBC: 0 % (ref 0.0–0.2)

## 2023-06-28 LAB — SURGICAL PCR SCREEN
MRSA, PCR: NEGATIVE
Staphylococcus aureus: NEGATIVE

## 2023-06-28 NOTE — Patient Outreach (Addendum)
Medicaid Managed Care Social Work Note  06/28/2023 Name:  Jean [name] MRN:  [id number] DOB:  1965/06/26  Jean [name] is an 58 y.o. year old female who is a primary patient of [name], DO.  The Medicaid Managed Care Coordination team was consulted for assistance with:  Mental Health Counseling and Resources  Ms. Hanssen was given information about Medicaid Managed Care Coordination team services today. [name] Patient agreed to services and verbal consent obtained.  Engaged with patient  for by telephone forfollow up visit in response to referral for case management and/or care coordination services.   Patient is participating in a Managed Medicaid Plan:  Yes  Assessments/Interventions:  Review of past medical history, allergies, medications, health status, including review of consultants reports, laboratory and other test data, was performed as part of comprehensive evaluation and provision of chronic care management services.  SDOH: (Social Drivers of Health) assessments and interventions performed: SDOH Interventions    Flowsheet Row Patient Outreach Telephone from 06/20/2023 in [place] POP[facility] DEPARTMENT Patient Outreach Telephone from 06/13/2023 in [place] [facility] POP[facility]ATION HEALTH DEPARTMENT Office Visit from 06/05/2023 in [facility] - A Dept Of [place]. [facility] Office Visit from 05/04/2021 in [facility] - A Dept Of [place]. [facility] Office Visit from 10/28/2019 in [facility] - A Dept Of [place]. [facility] Office Visit from 04/24/2017 in [facility] - A Dept Of [place]. [facility]  SDOH Interventions        Food Insecurity Interventions -- Community Resources Provided Intervention Not Indicated -- -- --  Housing Interventions -- Intervention Not Indicated Intervention Not Indicated -- -- --  Transportation Interventions -- --  Intervention Not Indicated -- -- --  Utilities Interventions -- Intervention Not Indicated Intervention Not Indicated -- -- --  Alcohol Usage Interventions Intervention Not Indicated (Score <7) -- -- -- -- --  Depression Interventions/Treatment  -- -- Medication Medication  [PATIENT STATES SHE ON MEDICATION] Medication Medication  Financial Strain Interventions -- [employer] Provided Other (Comment)  [need referral to social worker] -- -- --  Stress Interventions -- [employer] Provided, Provide Counseling Intervention Not Indicated -- -- --  Social Connections Interventions -- -- Intervention Not Indicated -- -- --  Health Literacy Interventions Intervention Not Indicated -- -- -- -- --       Advanced Directives Status:  See Care Plan for related entries.  Care Plan                 Allergies  Allergen Reactions   Lisinopril Swelling and Other (See Comments)    Lip swelling, patient reported - unable to confirm from chart     Medications Reviewed Today     Reviewed by Gustavus Bryant, LCSW (Social Worker) on 06/28/23 at 1724  Med List Status: <None>   Medication Order Taking? Sig Documenting Provider Last Dose Status Informant  0.9 %  sodium chloride infusion [id number]   Rachael Fee, MD  Active   acetaminophen (TYLENOL) 500 MG tablet [id number] No Take 500 mg by mouth every 6 (six) hours as needed for moderate pain. [provider] Taking Active Self  albuterol (VENTOLIN HFA) 108 (90 Base) MCG/ACT inhaler [id number] No Inhale 2 puffs into the lungs every 4 (four) hours as needed for wheezing or shortness of breath. Hunsucker, Lesia Sago, MD Taking Active Self  amLODipine (NORVASC)  10 MG tablet [id number] No Take 1 tablet (10 mg total) by mouth daily.  Patient not taking: Reported on 06/25/2023   Bunnie Domino Not Taking Expired 04/23/23 2359   aspirin 81 MG chewable tablet [id number] No Chew 1 tablet (81 mg total) by mouth daily. Sharlene Dory, PA-C  Taking Active Self  atorvastatin (LIPITOR) 40 MG tablet [id number] No Take 1 tablet (40 mg total) by mouth daily. Sharlene Dory, [place] Taking Expired 04/23/23 2359   Budeson-Glycopyrrol-Formoterol (BREZTRI AEROSPHERE) 160-9-4.8 MCG/ACT AERO [id number] No Inhale 2 puffs into the lungs in the morning and at bedtime. Hunsucker, Lesia Sago, MD Taking Active Self  budesonide-formoterol [facility]) 160-4.5 MCG/ACT inhaler [id number] No Inhale 2 puffs into the lungs 2 (two) times daily. Hunsucker, Lesia Sago, MD Taking Active Self  cyclobenzaprine (FLEXERIL) 10 MG tablet [id number] No TAKE 1 TABLET BY MOUTH TWICE A DAY AS NEEDED FOR MUSCLE SPASMS Kathryne Hitch, MD Taking Active Self  famotidine (PEPCID) 20 MG tablet [id number] No TAKE 1 TABLET BY MOUTH TWICE A DAY  Patient not taking: Reported on 06/25/2023   [name], DO Not Taking Active Self  gabapentin (NEURONTIN) 300 MG capsule [id number] No TAKE 1 CAPSULE BY MOUTH EVERYDAY AT BEDTIME Kathryne Hitch, MD Taking Active Self  metoprolol tartrate (LOPRESSOR) 25 MG tablet [id number] No Take 0.5 tablets (12.5 mg total) by mouth 2 (two) times daily. Sharlene Dory, [place] Taking Expired 06/25/23 2359 Self  pantoprazole (PROTONIX) 40 MG tablet [id number] No Take 1 tablet (40 mg total) by mouth daily. [name], DO Taking Active Self  sertraline (ZOLOFT) 25 MG tablet [id number]  Take 1 tablet (25 mg total) by mouth daily. Modena Slater, DO  Active Self  spironolactone (ALDACTONE) 25 MG tablet [id number] No Take 1 tablet (25 mg total) by mouth daily. Nahser, Deloris Ping, MD Taking Active Self  tetrahydrozoline 0.05 % ophthalmic solution [id number] No Place 1 drop into both eyes daily as needed (for dry eyes).  [provider] Taking Active Self  traMADol (ULTRAM) 50 MG tablet [id number]  Take 1 tablet (50 mg total) by mouth every 6 (six) hours as needed. Kirtland Bouchard, PA-C  Active Self            Patient Active Problem List    Diagnosis Date Noted   Osteoarthritis of right knee 06/05/2023   Food insecurity 06/05/2023   Bilateral shoulder pain 11/29/2022   Dysphagia 11/29/2022   History of cardioembolic cerebrovascular accident (CVA) 11/29/2022   Dizziness 06/19/2022   Constipation 05/09/2021   Weight loss 05/09/2021   Headache    Symptomatic bradycardia 04/18/2021   Near syncope 04/18/2021   Synovitis of left knee 07/26/2020   COVID-19 virus infection 05/11/2020   Low back pain 11/24/2019   Impingement syndrome of right shoulder 04/22/2019   History of dental surgery 02/05/2019   COPD GOLD 0 ? 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   GERD (gastroesophageal reflux disease)    Breast pain 06/23/2018   Dyspareunia in female 09/13/2017   Cervicalgia 04/24/2017   Chronic kidney disease, stage 3a (HCC) 10/29/2016   Chronic pain syndrome 05/10/2016   MDD (major depressive disorder) 10/19/2015   Dyspnea on exertion 08/22/2015   HTN (hypertension) 07/08/2015   Routine health maintenance 07/08/2015    Conditions to be addressed/monitored per PCP order:  Depression  Care Plan : LCSW Plan of Care  Updates made by Gustavus Bryant, LCSW since  06/28/2023 12:00 AM     Problem: Depression Identification (Depression)      Goal: Depressive Symptoms Identified   Start Date: 06/13/2023  Note:   Timeframe:  Short-Range Goal Priority:  High Start Date:   06/13/23              Expected End Date:  ongoing                     Follow Up Date--07/26/23 at 3 pm  - keep 90 percent of scheduled appointments -consider counseling or psychiatry -consider bumping up your self-care  -consider creating a stronger support network   Why is this important?             Combating depression may take some time.            If you don't feel better right away, don't give up on your treatment plan.    Current barriers:   Chronic Mental Health needs related to symptoms of depression,  stress and anxiety. Patient requires Support, Education, Resources, Referrals, Advocacy, and Care Coordination, in order to meet Unmet Mental Health Needs and to find a therapist and psychiatrist. Mental Health Concerns and Social Isolation Mobility concerns and upcoming total knee replacement- [facility] RNCM referral placed today Patient lacks knowledge of local and available community counseling agencies and resources.  Clinical Goal(s): verbalize understanding of plan for management of Anxiety, Depression, and Stress symptoms and demonstrate a reduction in symptoms. Patient will connect with a provider for ongoing mental health treatment, increase coping skills, healthy habits, self-management skills, and stress reduction      Patient will implement clinical interventions discussed today to decrease symptoms of depression and increase knowledge and/or ability of: coping skills.    Clinical Interventions:  Assessed patient's previous and current treatment, coping skills, support system and barriers to care. Patient provided hx. Patient lives with two males in a house and rents a room. Her daughter resides in [place] so her main support is her friend Alinda Money whose house she resides in.  Verbalization of feelings encouraged, motivational interviewing employed Emotional support provided, positive coping strategies explored. Establishing healthy boundaries emphasized and healthy self-care education provided Patient was educated on available mental health resources within their area that accept Medicaid and offer counseling and psychiatry. Patient was advised to contact the back of her [facility] insurance card for assistance with benefits as well. Patient is agreeable to referral to [facility] for counseling and psychiatry. [facility] LCSW made referral on 06/13/23. Patient educated on the difference between therapy and psychiatry per patient request Education provided on instructions for scheduling at [facility] as well as some  crisis support resources and [facility]'s walk in clinic hours. Emotional support provided. CBT intervention implemented regarding "being mentally fit" by combating negative thinking and replacing it with uplifting support, hope and positivity. Patient reports that she is on Zoloft but often times forgets to take this medication.  Patient is on food stamps.  Assessed social determinant of health barriers LCSW provided education on relaxation techniques such as meditation, deep breathing, massage, grounding exercises or yoga that can activate the body's relaxation response and ease symptoms of stress and anxiety. LCSW ask that when pt is struggling with difficult emotions and racing thoughts that they start this relaxation response process. LCSW provided extensive education on healthy coping skills for anxiety. SW used active and reflective listening, validated patient's feelings/concerns, and provided emotional support. Patient will work on implementing appropriate self-care habits into their daily  routine such as: staying positive, writing a gratitude list, drinking water, staying active around the house, taking their medications as prescribed, combating negative thoughts or emotions and staying connected with their family and friends. Positive reinforcement provided for this decision to work on this. LCSW provided education on healthy sleep hygiene and what that looks like. LCSW encouraged patient to implement a night time routine into their schedule that works best for them and that they are able to maintain. Advised patient to implement deep breathing/grounding/meditation/self-care exercises into their nightly routine to combat racing thoughts at night. LCSW encouraged patient to wake up at the same time each day, make their sleeping environment comfortable, exercise when able, to limit naps and to not eat or drink anything right before bed. Explained to go to bed at the same time each night and get up at the  same time each morning, including on the weekends. Make sure your bedroom is quiet, dark, relaxing, and at a comfortable temperature. Remove electronic devices, such as TVs, computers, and smart phones, from the bedroom.   Motivational Interviewing employed Depression screen reviewed  PHQ2/ PHQ9 completed or reviewed  Mindfulness or Relaxation training provided Active listening / Reflection utilized  Advance Care and HCPOA education provided Emotional Support Provided Problem Solving /Task Center strategies reviewed Provided psychoeducation for mental health needs  Provided brief CBT  Reviewed mental health medications and discussed importance of compliance:  Quality of sleep assessed & Sleep Hygiene techniques promoted  Participation in counseling encouraged  Verbalization of feelings encouraged  Suicidal Ideation/Homicidal Ideation assessed: Patient denies SI/HI  Review resources, discussed options and provided patient information about  Mental Health Resources Inter-disciplinary care team collaboration (see longitudinal plan of care) Patient reports that she has been successfully scheduled for both psychiatry and counseling at [facility] but these appointments are not until April of 2025. However, this may work out better since she has an upcoming surgery. She was reminded again of her crisis support resources if she needs to utilize them such as 988 and [facility]'s walk in clinic. She was encouraged to consider getting on their cancelation list as well and advised to call weekly to check for any cancelations so that she can get seen sooner if needed. Patient agreeable to this plan. Patient denies any current crises at this time.   Patient Goals/Self-Care Activities: Take medications as prescribed   Attend all scheduled provider appointments Call pharmacy for medication refills 3-7 days in advance of running out of medications Perform all self care activities independently  Perform IADL's  (shopping, preparing meals, housekeeping, managing finances) independently Call provider office for new concerns or questions Work with the social worker to address care coordination needs and will continue to work with the clinical team to address health care and disease management related needs call 1-800-273-TALK (toll free, 24 hour hotline) If in a crisis, CALL 988 or go to [facility] [address] [place] [phone]) Utilize healthy coping skills and supportive resources discussed Contact PCP with any questions or concerns Keep 90 percent of counseling appointments Call your insurance provider for more information about your Enhanced Benefits  Accept all calls from representative with Behavioral Health Providers in an effort to establish ongoing mental health counseling and supportive services. Incorporate into daily practice - relaxation techniques, deep breathing exercises, and mindfulness meditation strategies. Talk about feelings with friends, family members, spiritual advisor, etc. Contact LCSW directly [phone]), if you have questions, need assistance, or if additional social work needs  are identified between now and our next scheduled telephone outreach call. Call 988 for mental health hotline/crisis line if needed (24/7 available) Try techniques to reduce symptoms of anxiety/negative thinking (deep breathing, distraction, positive self talk, etc)  - develop a personal safety plan - exercise at least 2 to 3 times per week - have a plan for how to handle bad days - journal feelings and what helps to feel better or worse - spend time or talk with others at least 2 to 3 times per week - watch for early signs of feeling worse - begin personal counseling - call and visit an old friend - check out volunteer opportunities - join a support group - laugh; watch a funny movie or comedian - learn and use visualization or guided imagery -  perform a random act of kindness - practice relaxation or meditation daily - start or continue a personal journal - practice positive thinking and self-talk -continue with compliance of taking medication  -identify current effective and ineffective coping strategies.  -implement positive self-talk in care to increase self-esteem, confidence and feelings of control.  -consider alternative and complementary therapy approaches such as meditation, mindfulness or yoga.  Call your insurance provider to gain education on benefits if desired Call your primary care doctor if symptoms get worse  -journaling, prayer, worship services, meditation or pastoral counseling.  -increase participation in pleasurable group activities such as hobbies, singing, sports or volunteering).  -consider the use of meditative movement therapy such as tai chi or yoga  -start a regular daily exercise program based on tolerance, ability and patient choice to support positive thinking and activity    Follow Up Plan:  The patient has been provided with contact information for the care management team and has been advised to call with any mental health or health related questions or concerns.  The care management team will reach out to the patient again over the next 30 business  days.   If you are experiencing a Mental Health or Behavioral Health Crisis or need someone to talk to, please call the Suicide and Crisis Lifeline: 988       Follow up:  Patient agrees to Care Plan and Follow-up.  Plan: The Managed Medicaid care management team will reach out to the patient again over the next 30 days.  Dickie La, BSW, MSW, LCSW Licensed Clinical Social Worker [employer]   [facility] [email or web address] Direct Dial: [phone]

## 2023-06-28 NOTE — Progress Notes (Signed)
PCP - [name], DO  Cardiologist - Dr. Kristeen Miss, MD  Pulmonary Dr. Judeth Horn (next office visit is in March)  PPM/ICD -  denies Device Orders - n/a Rep Notified - n/a  Chest x-ray -  EKG - 07-18-22 Stress Test - denies ECHO - 12-14-*22 Cardiac Cath - denies  Sleep Study - denies  DM- denies  Blood Thinner Instructions: denies Aspirin Instructions: Instructed patient to follow up with Dr. Magnus Ivan for further instructions  ERAS Protcol - clear liquids until 4:30 am PRE-SURGERY G2-   COVID TEST- n/a   Anesthesia review: yes  Patient denies shortness of breath, fever, cough and chest pain at PAT appointment   All instructions explained to the patient, with a verbal understanding of the material. Patient agrees to go over the instructions while at home for a better understanding. Patient also instructed to self quarantine after being tested for COVID-19. The opportunity to ask questions was provided.

## 2023-06-28 NOTE — Patient Instructions (Signed)
Visit Information  Ms. Lofaro was given information about Medicaid Managed Care team care coordination services as a part of their [facility] Medicaid benefit. [name] verbally consented to engagement with the [facility] Managed Care team.   If you are experiencing a medical emergency, please call 911 or report to your local emergency department or urgent care.   If you have a non-emergency medical problem during routine business hours, please contact your provider's office and ask to speak with a nurse.   For questions related to your [facility], please call: [phone] or visit the homepage here: [email or web address]  If you would like to schedule transportation through your [facility], please call the following number at least 2 days in advance of your appointment: [phone]   Rides for urgent appointments can also be made after hours by calling Member Services.  Call the Behavioral Health Crisis Line at [phone], at any time, 24 hours a day, 7 days a week. If you are in danger or need immediate medical attention call 911.  If you would like help to quit smoking, call 1-800-QUIT-NOW (573 049 1893) OR Espaol: 1-855-Djelo-Ya (3-664-403-4742) o para ms informacin haga clic aqu or Text READY to 595-638 to register via text  Following is a copy of your plan of care:  Care Plan : LCSW Plan of Care  Updates made by Gustavus Bryant, LCSW since 06/28/2023 12:00 AM     Problem: Depression Identification (Depression)      Goal: Depressive Symptoms Identified   Start Date: 06/13/2023  Note:   Timeframe:  Short-Range Goal Priority:  High Start Date:   06/13/23              Expected End Date:  ongoing                     Follow Up Date--07/26/23 at 3 pm  - keep 90 percent of scheduled appointments -consider counseling or psychiatry -consider  bumping up your self-care  -consider creating a stronger support network   Why is this important?             Combating depression may take some time.            If you don't feel better right away, don't give up on your treatment plan.    Current barriers:   Chronic Mental Health needs related to symptoms of depression, stress and anxiety. Patient requires Support, Education, Resources, Referrals, Advocacy, and Care Coordination, in order to meet Unmet Mental Health Needs and to find a therapist and psychiatrist. Mental Health Concerns and Social Isolation Mobility concerns and upcoming total knee replacement- [facility] RNCM referral placed today Patient lacks knowledge of local and available community counseling agencies and resources.  Clinical Goal(s): verbalize understanding of plan for management of Anxiety, Depression, and Stress symptoms and demonstrate a reduction in symptoms. Patient will connect with a provider for ongoing mental health treatment, increase coping skills, healthy habits, self-management skills, and stress reduction      Patient will implement clinical interventions discussed today to decrease symptoms of depression and increase knowledge and/or ability of: coping skills.   Patient Goals/Self-Care Activities: Take medications as prescribed   Attend all scheduled provider appointments Call pharmacy for medication refills 3-7 days in advance of running out of medications Perform all self care activities independently  Perform IADL's (shopping, preparing meals, housekeeping, managing finances) independently Call provider office for new concerns or questions  Work with the Child psychotherapist to address care coordination needs and will continue to work with the clinical team to address health care and disease management related needs call 1-800-273-TALK (toll free, 24 hour hotline) If in a crisis, CALL 988 or go to [facility] [address],  [place] [phone]) Utilize healthy coping skills and supportive resources discussed Contact PCP with any questions or concerns Keep 90 percent of counseling appointments Call your insurance provider for more information about your Enhanced Benefits  Accept all calls from representative with Behavioral Health Providers in an effort to establish ongoing mental health counseling and supportive services. Incorporate into daily practice - relaxation techniques, deep breathing exercises, and mindfulness meditation strategies. Talk about feelings with friends, family members, spiritual advisor, etc. Contact LCSW directly [phone]), if you have questions, need assistance, or if additional social work needs are identified between now and our next scheduled telephone outreach call. Call 988 for mental health hotline/crisis line if needed (24/7 available) Try techniques to reduce symptoms of anxiety/negative thinking (deep breathing, distraction, positive self talk, etc)  - develop a personal safety plan - exercise at least 2 to 3 times per week - have a plan for how to handle bad days - journal feelings and what helps to feel better or worse - spend time or talk with others at least 2 to 3 times per week - watch for early signs of feeling worse - begin personal counseling - call and visit an old friend - check out volunteer opportunities - join a support group - laugh; watch a funny movie or comedian - learn and use visualization or guided imagery - perform a random act of kindness - practice relaxation or meditation daily - start or continue a personal journal - practice positive thinking and self-talk -continue with compliance of taking medication  -identify current effective and ineffective coping strategies.  -implement positive self-talk in care to increase self-esteem, confidence and feelings of control.  -consider alternative and complementary therapy approaches such as  meditation, mindfulness or yoga.  Call your insurance provider to gain education on benefits if desired Call your primary care doctor if symptoms get worse  -journaling, prayer, worship services, meditation or pastoral counseling.  -increase participation in pleasurable group activities such as hobbies, singing, sports or volunteering).  -consider the use of meditative movement therapy such as tai chi or yoga  -start a regular daily exercise program based on tolerance, ability and patient choice to support positive thinking and activity    Follow Up Plan:  The patient has been provided with contact information for the care management team and has been advised to call with any mental health or health related questions or concerns.  The care management team will reach out to the patient again over the next 30 business  days.   If you are experiencing a Mental Health or Behavioral Health Crisis or need someone to talk to, please call the Suicide and Crisis Lifeline: 988       24- Hour Availability:    [facility]  [address] [place], [place] [phone] Crisis [phone]   [facility] [phone]  [place] [facility]  [phone]    [facility]  [phone] (after hours)   Therapeutic Alternative/Mobile Crisis   [phone]   [place] National Suicide Hotline  3301096569 (TALK) OR 988   Call 988 for mental health emergencies   [place] Crisis  Line  [phone]);  [place] and [employer]  ([phone]); [place], [place], [place], [place], [place], [place], [place]    [facility] for [facility] Residents For 24/7 walk-up access to mental health services for [facility] children (4+), adolescents and adults, please visit the [facility] [address] located at [address] in [place], [place].  *[facility]  also provides comprehensive outpatient behavioral health services in a variety of locations around the [place].  Connect With [place] [address] [place], [place] [address] HelpLine: (252)562-6631 or 1-(972)576-9647  Get Directions  Find Help 24/7 By Phone Call our 24-hour HelpLine at 409-712-8854 or (947)746-1387 for immediate assistance for mental health and substance abuse issues.  Walk-In Help [place]: [facility] (Ages 4 and Up) [place]: Emergency Dept., [facility] [place] [facility] Additional Resources National Hopeline Network: 1-800-SUICIDE The National Suicide Prevention Lifeline: 1-800-273-TALK     The following coping skill education was provided for stress relief and mental health management: "When your car dies or a deadline looms, how do you respond? Long-term, low-grade or acute stress takes a serious toll on your body and mind, so don't ignore feelings of constant tension. Stress is a natural part of life. However, too much stress can harm our health, especially if it continues every day. This is chronic stress and can put you at risk for heart problems like heart disease and depression. Understand what's happening inside your body and learn simple coping skills to combat the negative impacts of everyday stressors.  Types of Stress There are two types of stress: Emotional - types of emotional stress are relationship problems, pressure at work, financial worries, experiencing discrimination or having a major life change. Physical - Examples of physical stress include being sick having pain, not sleeping well, recovery from an injury or having an alcohol and drug use disorder. Fight or Flight Sudden or ongoing stress activates your nervous system and floods your bloodstream with adrenaline and cortisol, two hormones that raise blood pressure, increase heart rate and spike blood sugar. These changes pitch your body into a fight or  flight response. That enabled our ancestors to outrun saber-toothed tigers, and it's helpful today for situations like dodging a car accident. But most modern chronic stressors, such as finances or a challenging relationship, keep your body in that heightened state, which hurts your health. Effects of Too Much Stress If constantly under stress, most of [place] will eventually start to function less well.  Multiple studies link chronic stress to a higher risk of heart disease, stroke, depression, weight gain, memory loss and even premature death, so it's important to recognize the warning signals. Talk to your doctor about ways to manage stress if you're experiencing any of these symptoms: Prolonged periods of poor sleep. Regular, severe headaches. Unexplained weight loss or gain. Feelings of isolation, withdrawal or worthlessness. Constant anger and irritability. Loss of interest in activities. Constant worrying or obsessive thinking. Excessive alcohol or drug use. Inability to concentrate.  10 Ways to Cope with Chronic Stress It's key to recognize stressful situations as they occur because it allows you to focus on managing how you react. We all need to know when to close our eyes and take a deep breath when we feel tension rising. Use these tips to prevent or reduce chronic stress. 1. Rebalance Work and Home All work and no play? If you're spending too much time at the office, intentionally put more dates  in your calendar to enjoy time for fun, either alone or with others. 2. Get Regular Exercise Moving your body on a regular basis balances the nervous system and increases blood circulation, helping to flush out stress hormones. Even a daily 20-minute walk makes a difference. Any kind of exercise can lower stress and improve your mood ? just pick activities that you enjoy and make it a regular habit. 3. Eat Well and Limit Alcohol and Stimulants Alcohol, nicotine and caffeine may temporarily  relieve stress but have negative health impacts and can make stress worse in the long run. Well-nourished bodies cope better, so start with a good breakfast, add more organic fruits and vegetables for a well-balanced diet, avoid processed foods and sugar, try herbal tea and drink more water. 4. Connect with Supportive People Talking face to face with another person releases hormones that reduce stress. Lean on those good listeners in your life. 5. Carve Out Hobby Time Do you enjoy gardening, reading, listening to music or some other creative pursuit? Engage in activities that bring you pleasure and joy; research shows that reduces stress by almost half and lowers your heart rate, too. 6. Practice Meditation, Stress Reduction or Yoga Relaxation techniques activate a state of restfulness that counterbalances your body's fight-or-flight hormones. Even if this also means a 10-minute break in a long day: listen to music, read, go for a walk in nature, do a hobby, take a bath or spend time with a friend. Also consider doing a mindfulness exercise or try a daily deep breathing or imagery practice. Deep Breathing Slow, calm and deep breathing can help you relax. Try these steps to focus on your breathing and repeat as needed. Find a comfortable position and close your eyes. Exhale and drop your shoulders. Breathe in through your nose; fill your lungs and then your belly. Think of relaxing your body, quieting your mind and becoming calm and peaceful. Breathe out slowly through your nose, relaxing your belly. Think of releasing tension, pain, worries or distress. Repeat steps three and four until you feel relaxed. Imagery This involves using your mind to excite the senses -- sound, vision, smell, taste and feeling. This may help ease your stress. Begin by getting comfortable and then do some slow breathing. Imagine a place you love being at. It could be somewhere from your childhood, somewhere you vacationed  or just a place in your imagination. Feel how it is to be in the place you're imagining. Pay attention to the sounds, air, colors, and who is there with you. This is a place where you feel cared for and loved. All is well. You are safe. Take in all the smells, sounds, tastes and feelings. As you do, feel your body being nourished and healed. Feel the calm that surrounds you. Breathe in all the good. Breathe out any discomfort or tension. 7. Sleep Enough If you get less than seven to eight hours of sleep, your body won't tolerate stress as well as it could. If stress keeps you up at night, address the cause, and add extra meditation into your day to make up for the lost z's. Try to get seven to nine hours of sleep each night. Make a regular bedtime schedule. Keep your room dark and cool. Try to avoid computers, TV, cell phones and tablets before bed. 8. Bond with Connections You Enjoy Go out for a coffee with a friend, chat with a neighbor, call a family member, visit with a clergy member, or even  hang out with your pet. Clinical studies show that spending even a short time with a companion animal can cut anxiety levels almost in half. 9. Take a Vacation Getting away from it all can reset your stress tolerance by increasing your mental and emotional outlook, which makes you a happier, more productive person upon return. Leave your cellphone and laptop at home! 10. See a Counselor, Coach or Therapist If negative thoughts overwhelm your ability to make positive changes, it's time to seek professional help. Make an appointment today--your health and life are worth it."  Dickie La, BSW, MSW, LCSW Licensed Clinical Social Worker [employer]   [facility] [email or web address] Direct Dial: [phone]

## 2023-06-30 ENCOUNTER — Other Ambulatory Visit: Payer: Self-pay | Admitting: Student

## 2023-06-30 DIAGNOSIS — F320 Major depressive disorder, single episode, mild: Secondary | ICD-10-CM

## 2023-07-01 NOTE — Anesthesia Preprocedure Evaluation (Addendum)
 Anesthesia Evaluation  Patient identified by MRN, date of birth, ID band Patient awake    Reviewed: Allergy & Precautions, NPO status , Patient's Chart, lab work & pertinent test results, reviewed documented beta blocker date and time   History of Anesthesia Complications (+) PONV and history of anesthetic complications  Airway Mallampati: III  TM Distance: >3 FB Neck ROM: Full   Comment: Previous grade I view with MAC 4, easy mask Dental  (+) Edentulous Upper, Edentulous Lower, Dental Advisory Given   Pulmonary neg shortness of breath, asthma , neg sleep apnea, COPD,  COPD inhaler, neg recent URI, Patient abstained from smoking., former smoker   Pulmonary exam normal breath sounds clear to auscultation       Cardiovascular hypertension (metoprolol), Pt. on home beta blockers (-) angina (-) Past MI, (-) Cardiac Stents and (-) CABG + dysrhythmias  Rhythm:Regular Rate:Normal  TTE 04/19/2021: IMPRESSIONS    1. Left ventricular ejection fraction, by estimation, is 60 to 65%. The  left ventricle has normal function. The left ventricle has no regional  wall motion abnormalities. Left ventricular diastolic parameters are  consistent with Grade I diastolic  dysfunction (impaired relaxation). There is chordal SAM with LVOT gradient  of 11 mm Hg, increasing to 18 mm Hg with Valsalva.   2. Right ventricular systolic function is normal. The right ventricular  size is normal. Tricuspid regurgitation signal is inadequate for assessing  PA pressure.   3. The mitral valve is grossly normal. No evidence of mitral valve  regurgitation. No evidence of mitral stenosis.   4. The aortic valve is tricuspid. There is moderate calcification of the  aortic valve. Aortic valve regurgitation is not visualized. Aortic valve  sclerosis is present, with no evidence of aortic valve stenosis. Aortic  valve area, by VTI measures 1.98  cm. Aortic valve mean  gradient measures 7.0 mmHg.     Neuro/Psych  Headaches, neg Seizures PSYCHIATRIC DISORDERS Anxiety Depression     Neuromuscular disease (chronic low back pain) CVA, No Residual Symptoms    GI/Hepatic ,GERD  Medicated,,(+)     substance abuse (history)  cocaine useLiver lesions   Endo/Other  negative endocrine ROS    Renal/GU CRFRenal disease     Musculoskeletal  (+) Arthritis , Osteoarthritis,    Abdominal   Peds  Hematology  (+) Blood dyscrasia, Sickle cell trait Lab Results      Component                Value               Date                      WBC                      5.8                 06/28/2023                HGB                      13.0                06/28/2023                HCT                      40.6  06/28/2023                MCV                      98.8                06/28/2023                PLT                      328                 06/28/2023              Anesthesia Other Findings   Reproductive/Obstetrics                             Anesthesia Physical Anesthesia Plan  ASA: 3  Anesthesia Plan: MAC and Spinal   Post-op Pain Management: Regional block* and Tylenol PO (pre-op)*   Induction: Intravenous  PONV Risk Score and Plan: 3 and Ondansetron, Dexamethasone, Propofol infusion and Treatment may vary due to age or medical condition  Airway Management Planned: Natural Airway and Simple Face Mask  Additional Equipment:   Intra-op Plan:   Post-operative Plan:   Informed Consent: I have reviewed the patients History and Physical, chart, labs and discussed the procedure including the risks, benefits and alternatives for the proposed anesthesia with the patient or authorized representative who has indicated his/her understanding and acceptance.     Dental advisory given  Plan Discussed with: CRNA and Anesthesiologist  Anesthesia Plan Comments: (Discussed potential risks of nerve blocks  including, but not limited to, infection, bleeding, nerve damage, seizures, pneumothorax, respiratory depression, and potential failure of the block. Alternatives to nerve blocks discussed. All questions answered.  I have discussed risks of neuraxial anesthesia including but not limited to infection, bleeding, nerve injury, back pain, headache, seizures, and failure of block. Patient denies bleeding disorders and is not currently anticoagulated. Labs have been reviewed. Risks and benefits discussed. All patient's questions answered.   Discussed with patient risks of MAC including, but not limited to, minor pain or discomfort, hearing people in the room, and possible need for backup general anesthesia. Risks for general anesthesia also discussed including, but not limited to, sore throat, hoarse voice, chipped/damaged teeth, injury to vocal cords, nausea and vomiting, allergic reactions, lung infection, heart attack, stroke, and death. All questions answered.   PAT note by Antionette Poles, PA-C: 58 year old female follows with cardiology for history of HTN, HLD, palpitations, presyncope (evaluated by cardiologist Dr. Katrinka Blazing 04/2021, felt vasovagal, no additional workup recommended).  Echo 04/2021 showed EF 60 to 65%, grade 1 DD, mild LV outflow tract obstruction due to chordal systolic anterior motion.  Cardiac event monitor 07/2022 showed predominantly sinus rhythm, rare PVCs and PACs, no significant arrhythmias.  She was started on metoprolol 12.5 mg twice daily.  Last seen by Jari Favre, PA-C on 11/21/2022, hypertension well-controlled, no concerns from cardiac standpoint.  Last seen by PCP Dr. Modena Slater on 06/05/2023, stable at that time, upcoming surgery discussed.  Other pertinent history includes PONV, former smoker with associated COPD (maintained on Breztri), GERD (maintained on pantoprazole), prior substance abuse, CKD 3.  Preop labs reviewed, creatinine mildly elevated 1.32, otherwise WNL.  EKG  07/18/2022: NSR.  Rate 66.  LAD.  LVH with repolarization abnormality.  Cannot rule out septal infarct, age  undetermined.  TTE 04/19/2021: 1. Left ventricular ejection fraction, by estimation, is 60 to 65%. The  left ventricle has normal function. The left ventricle has no regional  wall motion abnormalities. Left ventricular diastolic parameters are  consistent with Grade I diastolic  dysfunction (impaired relaxation). There is chordal SAM with LVOT gradient  of 11 mm Hg, increasing to 18 mm Hg with Valsalva.  2. Right ventricular systolic function is normal. The right ventricular  size is normal. Tricuspid regurgitation signal is inadequate for assessing  PA pressure.  3. The mitral valve is grossly normal. No evidence of mitral valve  regurgitation. No evidence of mitral stenosis.  4. The aortic valve is tricuspid. There is moderate calcification of the  aortic valve. Aortic valve regurgitation is not visualized. Aortic valve  sclerosis is present, with no evidence of aortic valve stenosis. Aortic  valve area, by VTI measures 1.98  cm. Aortic valve mean gradient measures 7.0 mmHg.   Comparison(s): No significant change from prior study.    )        Anesthesia Quick Evaluation

## 2023-07-01 NOTE — H&P (Signed)
 TOTAL KNEE ADMISSION H&P  Patient is being admitted for right total knee arthroplasty.  Subjective:  Chief Complaint:right knee pain.  HPI: Jean [name], 58 y.o. female, has a history of pain and functional disability in the right knee due to arthritis and has failed non-surgical conservative treatments for greater than 12 weeks to includeNSAID's and/or analgesics, corticosteriod injections, use of assistive devices, and activity modification.  Onset of symptoms was abrupt, starting 6 months ago with gradually worsening course since that time. The patient noted no past surgery on the right knee(s).  Patient currently rates pain in the right knee(s) at 10 out of 10 with activity. Patient has night pain, worsening of pain with activity and weight bearing, pain that interferes with activities of daily living, pain with passive range of motion, crepitus, and joint swelling.  Patient has evidence of subchondral sclerosis, periarticular osteophytes, and joint space narrowing by imaging studies. There is no active infection.  Patient Active Problem List   Diagnosis Date Noted   Osteoarthritis of right knee 06/05/2023   Food insecurity 06/05/2023   Bilateral shoulder pain 11/29/2022   Dysphagia 11/29/2022   History of cardioembolic cerebrovascular accident (CVA) 11/29/2022   Dizziness 06/19/2022   Constipation 05/09/2021   Weight loss 05/09/2021   Headache    Symptomatic bradycardia 04/18/2021   Near syncope 04/18/2021   Synovitis of left knee 07/26/2020   COVID-19 virus infection 05/11/2020   Low back pain 11/24/2019   Impingement syndrome of right shoulder 04/22/2019   History of dental surgery 02/05/2019   COPD GOLD 0 ? 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   GERD (gastroesophageal reflux disease)    Breast pain 06/23/2018   Dyspareunia in female 09/13/2017   Cervicalgia 04/24/2017   Chronic kidney disease, stage 3a (HCC) 10/29/2016    Chronic pain syndrome 05/10/2016   MDD (major depressive disorder) 10/19/2015   Dyspnea on exertion 08/22/2015   HTN (hypertension) 07/08/2015   Routine health maintenance 07/08/2015   Past Medical History:  Diagnosis Date   Adnexal mass 2019   likely remnant fibroid on broad ligament; getting diagnostic lap   Anxiety    on meds   Arthritis    hip-uses OTC meds   Asthma    uses inhaler as needed   Carpal tunnel syndrome    bilateral   Chest wall pain 10/15/2016   Chronic lower back pain    COPD (chronic obstructive pulmonary disease) (HCC)    uses in haler as needed   Depression    on meds   Dyspnea    with exertion   Dysrhythmia    GERD (gastroesophageal reflux disease)    Headache    migraines   Herpes    Hypertension    on meds   Liver lesion, right lobe 12/01/2015   CT chest without contrast 11/30/15 with hepatic lesions measuring 2.0 x 1.7 cm of the right lobe and 1 x 1 cm on the dome. CT abd/pel 8/18: 1.3cm inferior R liver lobe, prob benign but recommend 3-44mo f/u with MRI w/wo IV contrast with attenuation MRI 6/19: combination of benign cavernous hemangiomas and small benign cysts   PONV (postoperative nausea and vomiting)    Poor dental hygiene    chipped upper front and several loose teeth   Seasonal allergies    Sickle cell trait (HCC)    Substance abuse (HCC)    cocaine/crack cocaine - last use 02/07/18   SVD (  spontaneous vaginal delivery)    x 1   Tobacco abuse 07/08/2015   Uses walker    for ambulation   Uterine leiomyoma 02/16/2016   s/p vaginal hysterectomy 2018   Vaginal discharge 03/26/2018    Past Surgical History:  Procedure Laterality Date   LAPAROSCOPIC OVARIAN CYSTECTOMY Right 02/25/2018   Procedure: LAPAROSCOPIC REMOVAL OF RIGHT ADNEXAL MASS;  Surgeon: Hermina Staggers, MD;  Location: [facility];  Service: Gynecology;  Laterality: Right;   SALPINGECTOMY Left    lap - ectopic preg   TOTAL HIP ARTHROPLASTY Left 01/20/2016   Procedure: LEFT  TOTAL HIP ARTHROPLASTY ANTERIOR APPROACH;  Surgeon: Kathryne Hitch, MD;  Location: [facility];  Service: Orthopedics;  Laterality: Left;   TUBAL LIGATION     VAGINAL HYSTERECTOMY Right 12/11/2016   Procedure: HYSTERECTOMY VAGINAL WITH RIGHT SALPINGECTOMY;  Surgeon: Hermina Staggers, MD;  Location: [facility];  Service: Gynecology;  Laterality: Right;    Current Facility-Administered Medications  Medication Dose Route Frequency Provider Last Rate Last Admin   0.9 %  sodium chloride infusion  500 mL Intravenous Once Rachael Fee, MD       Current Outpatient Medications  Medication Sig Dispense Refill Last Dose/Taking   acetaminophen (TYLENOL) 500 MG tablet Take 500 mg by mouth every 6 (six) hours as needed for moderate pain.   Taking As Needed   albuterol (VENTOLIN HFA) 108 (90 Base) MCG/ACT inhaler Inhale 2 puffs into the lungs every 4 (four) hours as needed for wheezing or shortness of breath. 18 each 1 Taking As Needed   aspirin 81 MG chewable tablet Chew 1 tablet (81 mg total) by mouth daily. 90 tablet 3 Taking   cyclobenzaprine (FLEXERIL) 10 MG tablet TAKE 1 TABLET BY MOUTH TWICE A DAY AS NEEDED FOR MUSCLE SPASMS 30 tablet 3 Taking   gabapentin (NEURONTIN) 300 MG capsule TAKE 1 CAPSULE BY MOUTH EVERYDAY AT BEDTIME 90 capsule 1 Taking   metoprolol tartrate (LOPRESSOR) 25 MG tablet Take 0.5 tablets (12.5 mg total) by mouth 2 (two) times daily. 90 tablet 2 Taking   pantoprazole (PROTONIX) 40 MG tablet Take 1 tablet (40 mg total) by mouth daily. 90 tablet 1 Taking   sertraline (ZOLOFT) 25 MG tablet Take 1 tablet (25 mg total) by mouth daily. 30 tablet 5 Taking   spironolactone (ALDACTONE) 25 MG tablet Take 1 tablet (25 mg total) by mouth daily. 90 tablet 3 Taking   tetrahydrozoline 0.05 % ophthalmic solution Place 1 drop into both eyes daily as needed (for dry eyes).    Taking As Needed   traMADol (ULTRAM) 50 MG tablet Take 1 tablet (50 mg total) by mouth every 6 (six) hours as needed. 30  tablet 0 Taking As Needed   amLODipine (NORVASC) 10 MG tablet Take 1 tablet (10 mg total) by mouth daily. (Patient not taking: Reported on 06/25/2023) 90 tablet 3 Not Taking   atorvastatin (LIPITOR) 40 MG tablet Take 1 tablet (40 mg total) by mouth daily. 90 tablet 3    Budeson-Glycopyrrol-Formoterol (BREZTRI AEROSPHERE) 160-9-4.8 MCG/ACT AERO Inhale 2 puffs into the lungs in the morning and at bedtime.      budesonide-formoterol (SYMBICORT) 160-4.5 MCG/ACT inhaler Inhale 2 puffs into the lungs 2 (two) times daily. 1 each 12    famotidine (PEPCID) 20 MG tablet TAKE 1 TABLET BY MOUTH TWICE A DAY (Patient not taking: Reported on 06/25/2023) 180 tablet 1 Not Taking   Allergies  Allergen Reactions   Lisinopril Swelling and Other (See Comments)  Lip swelling, patient reported - unable to confirm from chart     Social History   Tobacco Use   Smoking status: Former    Average packs/day: 0.5 packs/day for 35.5 years (17.7 ttl pk-yrs)    Types: Cigarettes    Start date: 05/22/1983   Smokeless tobacco: Never  Substance Use Topics   Alcohol use: Yes    Alcohol/week: 3.0 standard drinks of alcohol    Types: 3 Cans of beer per week    Comment: socially    Family History  Problem Relation Age of Onset   Cancer Mother    Hypertension Mother    Diabetes Mother    Breast cancer Mother        diagnosed in her 46's   Stroke Father    Breast cancer Cousin    Colon cancer Neg Hx    Colon polyps Neg Hx    Esophageal cancer Neg Hx    Rectal cancer Neg Hx    Stomach cancer Neg Hx      Review of Systems  Objective:  Physical Exam Vitals reviewed.  Constitutional:      Appearance: Normal appearance. She is normal weight.  HENT:     Head: Normocephalic and atraumatic.  Eyes:     Extraocular Movements: Extraocular movements intact.     Pupils: Pupils are equal, round, and reactive to light.  Cardiovascular:     Rate and Rhythm: Normal rate and regular rhythm.  Pulmonary:     Effort:  Pulmonary effort is normal.     Breath sounds: Normal breath sounds.  Abdominal:     Palpations: Abdomen is soft.  Musculoskeletal:     Cervical back: Normal range of motion and neck supple.     Right knee: Effusion, bony tenderness and crepitus present. Decreased range of motion. Tenderness present over the medial joint line, lateral joint line and patellar tendon.  Neurological:     Mental Status: She is alert and oriented to person, place, and time.  Psychiatric:        Behavior: Behavior normal.     Vital signs in last 24 hours:    Labs:   Estimated body mass index is 22.95 kg/m as calculated from the following:   Height as of 06/28/23: 5\' 4"  (1.626 m).   Weight as of 06/28/23: 60.6 kg.   Imaging Review MRI demonstrates severe degenerative joint disease of the right knee(s). The overall alignment isneutral. The bone quality appears to be excellent for age and reported activity level.      Assessment/Plan:  End stage arthritis, right knee   The patient history, physical examination, clinical judgment of the provider and imaging studies are consistent with end stage degenerative joint disease of the right knee(s) and total knee arthroplasty is deemed medically necessary. The treatment options including medical management, injection therapy arthroscopy and arthroplasty were discussed at length. The risks and benefits of total knee arthroplasty were presented and reviewed. The risks due to aseptic loosening, infection, stiffness, patella tracking problems, thromboembolic complications and other imponderables were discussed. The patient acknowledged the explanation, agreed to proceed with the plan and consent was signed. Patient is being admitted for inpatient treatment for surgery, pain control, PT, OT, prophylactic antibiotics, VTE prophylaxis, progressive ambulation and ADL's and discharge planning. The patient is planning to be discharged home with home health services

## 2023-07-01 NOTE — Progress Notes (Signed)
 Anesthesia Chart Review:  58 year old female follows with cardiology for history of HTN, HLD, palpitations, presyncope (evaluated by cardiologist Dr. Katrinka Blazing 04/2021, felt vasovagal, no additional workup recommended).  Echo 04/2021 showed EF 60 to 65%, grade 1 DD, mild LV outflow tract obstruction due to chordal systolic anterior motion.  Cardiac event monitor 07/2022 showed predominantly sinus rhythm, rare PVCs and PACs, no significant arrhythmias.  She was started on metoprolol 12.5 mg twice daily.  Last seen by Jari Favre, PA-C on 11/21/2022, hypertension well-controlled, no concerns from cardiac standpoint.  Last seen by PCP Dr. Modena Slater on 06/05/2023, stable at that time, upcoming surgery discussed.  Other pertinent history includes PONV, former smoker with associated COPD (maintained on Breztri), GERD (maintained on pantoprazole), prior substance abuse, CKD 3.  Preop labs reviewed, creatinine mildly elevated 1.32, otherwise WNL.  EKG 07/18/2022: NSR.  Rate 66.  LAD.  LVH with repolarization abnormality.  Cannot rule out septal infarct, age undetermined.  TTE 04/19/2021: 1. Left ventricular ejection fraction, by estimation, is 60 to 65%. The  left ventricle has normal function. The left ventricle has no regional  wall motion abnormalities. Left ventricular diastolic parameters are  consistent with Grade I diastolic  dysfunction (impaired relaxation). There is chordal SAM with LVOT gradient  of 11 mm Hg, increasing to 18 mm Hg with Valsalva.   2. Right ventricular systolic function is normal. The right ventricular  size is normal. Tricuspid regurgitation signal is inadequate for assessing  PA pressure.   3. The mitral valve is grossly normal. No evidence of mitral valve  regurgitation. No evidence of mitral stenosis.   4. The aortic valve is tricuspid. There is moderate calcification of the  aortic valve. Aortic valve regurgitation is not visualized. Aortic valve  sclerosis is present,  with no evidence of aortic valve stenosis. Aortic  valve area, by VTI measures 1.98  cm. Aortic valve mean gradient measures 7.0 mmHg.   Comparison(s): No significant change from prior study.     Zannie Cove [facility] Short Stay Center/Anesthesiology Phone [phone] 07/01/2023 8:58 AM

## 2023-07-02 ENCOUNTER — Ambulatory Visit (HOSPITAL_COMMUNITY): Payer: Self-pay | Admitting: Anesthesiology

## 2023-07-02 ENCOUNTER — Observation Stay (HOSPITAL_COMMUNITY): Payer: Medicaid Other

## 2023-07-02 ENCOUNTER — Observation Stay (HOSPITAL_COMMUNITY)
Admission: RE | Admit: 2023-07-02 | Discharge: 2023-07-04 | Disposition: A | Discharge status: Home / Self Care | Payer: Medicaid Other | Attending: Orthopaedic Surgery | Admitting: Orthopaedic Surgery

## 2023-07-02 ENCOUNTER — Other Ambulatory Visit: Payer: Self-pay

## 2023-07-02 ENCOUNTER — Encounter (HOSPITAL_COMMUNITY): Payer: Self-pay | Admitting: Orthopaedic Surgery

## 2023-07-02 ENCOUNTER — Ambulatory Visit (HOSPITAL_COMMUNITY): Payer: Self-pay | Admitting: Physician Assistant

## 2023-07-02 ENCOUNTER — Encounter (HOSPITAL_COMMUNITY)
Admission: RE | Disposition: A | Discharge status: Home / Self Care | Payer: Self-pay | Source: Home / Self Care | Attending: Orthopaedic Surgery

## 2023-07-02 DIAGNOSIS — Z96651 Presence of right artificial knee joint: Secondary | ICD-10-CM

## 2023-07-02 DIAGNOSIS — Z96642 Presence of left artificial hip joint: Secondary | ICD-10-CM | POA: Diagnosis not present

## 2023-07-02 DIAGNOSIS — J449 Chronic obstructive pulmonary disease, unspecified: Secondary | ICD-10-CM | POA: Insufficient documentation

## 2023-07-02 DIAGNOSIS — Z8673 Personal history of transient ischemic attack (TIA), and cerebral infarction without residual deficits: Secondary | ICD-10-CM | POA: Diagnosis not present

## 2023-07-02 DIAGNOSIS — I129 Hypertensive chronic kidney disease with stage 1 through stage 4 chronic kidney disease, or unspecified chronic kidney disease: Secondary | ICD-10-CM | POA: Insufficient documentation

## 2023-07-02 DIAGNOSIS — M1711 Unilateral primary osteoarthritis, right knee: Principal | ICD-10-CM | POA: Diagnosis present

## 2023-07-02 DIAGNOSIS — Z7982 Long term (current) use of aspirin: Secondary | ICD-10-CM | POA: Insufficient documentation

## 2023-07-02 DIAGNOSIS — M62838 Other muscle spasm: Secondary | ICD-10-CM

## 2023-07-02 DIAGNOSIS — N1831 Chronic kidney disease, stage 3a: Secondary | ICD-10-CM | POA: Diagnosis not present

## 2023-07-02 DIAGNOSIS — N189 Chronic kidney disease, unspecified: Secondary | ICD-10-CM | POA: Diagnosis not present

## 2023-07-02 DIAGNOSIS — Z87891 Personal history of nicotine dependence: Secondary | ICD-10-CM | POA: Diagnosis not present

## 2023-07-02 DIAGNOSIS — Z79899 Other long term (current) drug therapy: Secondary | ICD-10-CM | POA: Insufficient documentation

## 2023-07-02 DIAGNOSIS — M25559 Pain in unspecified hip: Secondary | ICD-10-CM

## 2023-07-02 HISTORY — PX: TOTAL KNEE ARTHROPLASTY: SHX125

## 2023-07-02 SURGERY — ARTHROPLASTY, KNEE, TOTAL
Anesthesia: Monitor Anesthesia Care | Site: Knee | Laterality: Right

## 2023-07-02 MED ORDER — ASPIRIN 81 MG PO CHEW
81.0000 mg | CHEWABLE_TABLET | Freq: Two times a day (BID) | ORAL | Status: DC
  Administered 2023-07-02 – 2023-07-04 (×4): 81 mg via ORAL
  Filled 2023-07-02 (×4): qty 1

## 2023-07-02 MED ORDER — TRANEXAMIC ACID-NACL 1000-0.7 MG/100ML-% IV SOLN
1000.0000 mg | INTRAVENOUS | Status: AC
  Administered 2023-07-02: 1000 mg via INTRAVENOUS

## 2023-07-02 MED ORDER — ROPIVACAINE HCL 5 MG/ML IJ SOLN
INTRAMUSCULAR | Status: DC | PRN
  Administered 2023-07-02: 20 mL via PERINEURAL

## 2023-07-02 MED ORDER — CEFAZOLIN SODIUM-DEXTROSE 2-4 GM/100ML-% IV SOLN
2.0000 g | INTRAVENOUS | Status: AC
  Administered 2023-07-02: 2 g via INTRAVENOUS

## 2023-07-02 MED ORDER — CEFAZOLIN SODIUM-DEXTROSE 2-4 GM/100ML-% IV SOLN
INTRAVENOUS | Status: AC
  Filled 2023-07-02: qty 100

## 2023-07-02 MED ORDER — GABAPENTIN 300 MG PO CAPS
300.0000 mg | ORAL_CAPSULE | Freq: Every day | ORAL | Status: DC
  Administered 2023-07-02 – 2023-07-03 (×2): 300 mg via ORAL
  Filled 2023-07-02 (×2): qty 1

## 2023-07-02 MED ORDER — OXYCODONE HCL 5 MG PO TABS
5.0000 mg | ORAL_TABLET | ORAL | Status: DC | PRN
  Administered 2023-07-02 (×2): 10 mg via ORAL
  Filled 2023-07-02: qty 2

## 2023-07-02 MED ORDER — AMISULPRIDE (ANTIEMETIC) 5 MG/2ML IV SOLN
INTRAVENOUS | Status: AC
Start: 2023-07-02 — End: 2023-07-02
  Filled 2023-07-02: qty 4

## 2023-07-02 MED ORDER — ONDANSETRON HCL 4 MG/2ML IJ SOLN: 4.0000 mg | Freq: Four times a day (QID) | INTRAMUSCULAR | Status: DC | PRN

## 2023-07-02 MED ORDER — ACETAMINOPHEN 500 MG PO TABS
ORAL_TABLET | ORAL | Status: AC
  Administered 2023-07-02: 1000 mg via ORAL
  Filled 2023-07-02: qty 2

## 2023-07-02 MED ORDER — DOCUSATE SODIUM 100 MG PO CAPS
100.0000 mg | ORAL_CAPSULE | Freq: Two times a day (BID) | ORAL | Status: DC
  Administered 2023-07-02 – 2023-07-04 (×5): 100 mg via ORAL
  Filled 2023-07-02 (×5): qty 1

## 2023-07-02 MED ORDER — MIDAZOLAM HCL 2 MG/2ML IJ SOLN
INTRAMUSCULAR | Status: DC | PRN
  Administered 2023-07-02: 2 mg via INTRAVENOUS

## 2023-07-02 MED ORDER — LACTATED RINGERS IV SOLN: INTRAVENOUS | Status: DC | PRN

## 2023-07-02 MED ORDER — PHENYLEPHRINE HCL-NACL 20-0.9 MG/250ML-% IV SOLN
INTRAVENOUS | Status: DC | PRN
  Administered 2023-07-02: 30 ug/min via INTRAVENOUS

## 2023-07-02 MED ORDER — METOCLOPRAMIDE HCL 5 MG PO TABS: 5.0000 mg | ORAL_TABLET | Freq: Three times a day (TID) | ORAL | Status: DC | PRN

## 2023-07-02 MED ORDER — ACETAMINOPHEN 325 MG PO TABS: 325.0000 mg | ORAL_TABLET | Freq: Four times a day (QID) | ORAL | Status: DC | PRN

## 2023-07-02 MED ORDER — DIPHENHYDRAMINE HCL 12.5 MG/5ML PO ELIX: 12.5000 mg | ORAL_SOLUTION | ORAL | Status: DC | PRN

## 2023-07-02 MED ORDER — PROPOFOL 500 MG/50ML IV EMUL
INTRAVENOUS | Status: DC | PRN
  Administered 2023-07-02: 75 ug/kg/min via INTRAVENOUS

## 2023-07-02 MED ORDER — OXYCODONE HCL 5 MG PO TABS
10.0000 mg | ORAL_TABLET | ORAL | Status: DC | PRN
Start: 2023-07-02 — End: 2023-07-04
  Administered 2023-07-03 – 2023-07-04 (×5): 15 mg via ORAL
  Filled 2023-07-02 (×5): qty 3

## 2023-07-02 MED ORDER — PANTOPRAZOLE SODIUM 40 MG PO TBEC
40.0000 mg | DELAYED_RELEASE_TABLET | Freq: Every day | ORAL | Status: DC
Start: 2023-07-02 — End: 2023-07-04
  Administered 2023-07-02 – 2023-07-04 (×3): 40 mg via ORAL
  Filled 2023-07-02 (×3): qty 1

## 2023-07-02 MED ORDER — PHENOL 1.4 % MT LIQD: 1.0000 | OROMUCOSAL | Status: DC | PRN

## 2023-07-02 MED ORDER — HYDROMORPHONE HCL 1 MG/ML IJ SOLN
0.5000 mg | INTRAMUSCULAR | Status: DC | PRN
  Administered 2023-07-02: 1 mg via INTRAVENOUS
  Administered 2023-07-02: 0.5 mg via INTRAVENOUS
  Administered 2023-07-03 – 2023-07-04 (×4): 1 mg via INTRAVENOUS
  Filled 2023-07-02 (×5): qty 1

## 2023-07-02 MED ORDER — 0.9 % SODIUM CHLORIDE (POUR BTL) OPTIME
TOPICAL | Status: DC | PRN
  Administered 2023-07-02: 1000 mL

## 2023-07-02 MED ORDER — CEFAZOLIN SODIUM-DEXTROSE 2-4 GM/100ML-% IV SOLN
2.0000 g | Freq: Four times a day (QID) | INTRAVENOUS | Status: AC
  Administered 2023-07-02 (×2): 2 g via INTRAVENOUS
  Filled 2023-07-02 (×2): qty 100

## 2023-07-02 MED ORDER — SPIRONOLACTONE 25 MG PO TABS
25.0000 mg | ORAL_TABLET | Freq: Every day | ORAL | Status: DC
Start: 2023-07-02 — End: 2023-07-04
  Administered 2023-07-02 – 2023-07-04 (×3): 25 mg via ORAL
  Filled 2023-07-02 (×3): qty 1

## 2023-07-02 MED ORDER — ONDANSETRON HCL 4 MG PO TABS: 4.0000 mg | ORAL_TABLET | Freq: Four times a day (QID) | ORAL | Status: DC | PRN

## 2023-07-02 MED ORDER — FENTANYL CITRATE (PF) 250 MCG/5ML IJ SOLN
INTRAMUSCULAR | Status: AC
  Filled 2023-07-02: qty 5

## 2023-07-02 MED ORDER — PROPOFOL 1000 MG/100ML IV EMUL
INTRAVENOUS | Status: AC
  Filled 2023-07-02: qty 100

## 2023-07-02 MED ORDER — CHLORHEXIDINE GLUCONATE 0.12 % MT SOLN: 15.0000 mL | Freq: Once | OROMUCOSAL | Status: AC

## 2023-07-02 MED ORDER — HYDRALAZINE HCL 20 MG/ML IJ SOLN
5.0000 mg | INTRAMUSCULAR | Status: DC | PRN
  Administered 2023-07-02: 5 mg via INTRAVENOUS

## 2023-07-02 MED ORDER — AMISULPRIDE (ANTIEMETIC) 5 MG/2ML IV SOLN
10.0000 mg | Freq: Once | INTRAVENOUS | Status: AC | PRN
  Administered 2023-07-02: 10 mg via INTRAVENOUS

## 2023-07-02 MED ORDER — ALBUTEROL SULFATE (2.5 MG/3ML) 0.083% IN NEBU: 2.5000 mg | INHALATION_SOLUTION | RESPIRATORY_TRACT | Status: DC | PRN

## 2023-07-02 MED ORDER — HYDROMORPHONE HCL 1 MG/ML IJ SOLN
INTRAMUSCULAR | Status: AC
  Filled 2023-07-02: qty 1

## 2023-07-02 MED ORDER — HYDRALAZINE HCL 20 MG/ML IJ SOLN
INTRAMUSCULAR | Status: AC
  Filled 2023-07-02: qty 1

## 2023-07-02 MED ORDER — ALBUTEROL SULFATE HFA 108 (90 BASE) MCG/ACT IN AERS: 2.0000 | INHALATION_SPRAY | RESPIRATORY_TRACT | Status: DC | PRN

## 2023-07-02 MED ORDER — FENTANYL CITRATE (PF) 250 MCG/5ML IJ SOLN
INTRAMUSCULAR | Status: DC | PRN
  Administered 2023-07-02: 50 ug via INTRAVENOUS

## 2023-07-02 MED ORDER — OXYCODONE HCL 5 MG PO TABS: 5.0000 mg | ORAL_TABLET | Freq: Once | ORAL | Status: DC | PRN

## 2023-07-02 MED ORDER — BUPIVACAINE-EPINEPHRINE 0.25% -1:200000 IJ SOLN
INTRAMUSCULAR | Status: DC | PRN
Start: 2023-07-02 — End: 2023-07-02
  Administered 2023-07-02: 30 mL

## 2023-07-02 MED ORDER — PHENYLEPHRINE HCL-NACL 20-0.9 MG/250ML-% IV SOLN
INTRAVENOUS | Status: AC
  Filled 2023-07-02: qty 250

## 2023-07-02 MED ORDER — MENTHOL 3 MG MT LOZG: 1.0000 | LOZENGE | OROMUCOSAL | Status: DC | PRN

## 2023-07-02 MED ORDER — OXYCODONE HCL 5 MG PO TABS
ORAL_TABLET | ORAL | Status: AC
Start: 2023-07-02 — End: 2023-07-02
  Filled 2023-07-02: qty 2

## 2023-07-02 MED ORDER — FENTANYL CITRATE (PF) 100 MCG/2ML IJ SOLN
25.0000 ug | INTRAMUSCULAR | Status: DC | PRN
  Administered 2023-07-02 (×2): 50 ug via INTRAVENOUS

## 2023-07-02 MED ORDER — ORAL CARE MOUTH RINSE: 15.0000 mL | Freq: Once | OROMUCOSAL | Status: AC

## 2023-07-02 MED ORDER — SERTRALINE HCL 25 MG PO TABS
25.0000 mg | ORAL_TABLET | Freq: Every day | ORAL | Status: DC
Start: 2023-07-02 — End: 2023-07-04
  Administered 2023-07-02 – 2023-07-04 (×3): 25 mg via ORAL
  Filled 2023-07-02 (×3): qty 1

## 2023-07-02 MED ORDER — CHLORHEXIDINE GLUCONATE 0.12 % MT SOLN
OROMUCOSAL | Status: AC
  Administered 2023-07-02: 15 mL via OROMUCOSAL
  Filled 2023-07-02: qty 15

## 2023-07-02 MED ORDER — METOPROLOL TARTRATE 12.5 MG HALF TABLET
12.5000 mg | ORAL_TABLET | Freq: Two times a day (BID) | ORAL | Status: DC
  Administered 2023-07-02 – 2023-07-04 (×4): 12.5 mg via ORAL
  Filled 2023-07-02 (×4): qty 1

## 2023-07-02 MED ORDER — SODIUM CHLORIDE 0.9 % IR SOLN
Status: DC | PRN
  Administered 2023-07-02: 1000 mL

## 2023-07-02 MED ORDER — ACETAMINOPHEN 500 MG PO TABS: 1000.0000 mg | ORAL_TABLET | Freq: Once | ORAL | Status: AC

## 2023-07-02 MED ORDER — CYCLOBENZAPRINE HCL 10 MG PO TABS
10.0000 mg | ORAL_TABLET | Freq: Three times a day (TID) | ORAL | Status: DC | PRN
Start: 2023-07-02 — End: 2023-07-04
  Administered 2023-07-04: 10 mg via ORAL
  Filled 2023-07-02: qty 1

## 2023-07-02 MED ORDER — FENTANYL CITRATE (PF) 100 MCG/2ML IJ SOLN
INTRAMUSCULAR | Status: AC
  Filled 2023-07-02: qty 2

## 2023-07-02 MED ORDER — ALUM & MAG HYDROXIDE-SIMETH 200-200-20 MG/5ML PO SUSP: 30.0000 mL | ORAL | Status: DC | PRN

## 2023-07-02 MED ORDER — MIDAZOLAM HCL 2 MG/2ML IJ SOLN
INTRAMUSCULAR | Status: AC
  Filled 2023-07-02: qty 2

## 2023-07-02 MED ORDER — TRANEXAMIC ACID-NACL 1000-0.7 MG/100ML-% IV SOLN
INTRAVENOUS | Status: AC
  Filled 2023-07-02: qty 100

## 2023-07-02 MED ORDER — METOCLOPRAMIDE HCL 5 MG/ML IJ SOLN: 5.0000 mg | Freq: Three times a day (TID) | INTRAMUSCULAR | Status: DC | PRN

## 2023-07-02 MED ORDER — SODIUM CHLORIDE 0.9 % IV SOLN: INTRAVENOUS | Status: AC

## 2023-07-02 MED ORDER — BUPIVACAINE IN DEXTROSE 0.75-8.25 % IT SOLN
INTRATHECAL | Status: DC | PRN
Start: 2023-07-02 — End: 2023-07-02
  Administered 2023-07-02: 1.6 mL via INTRATHECAL

## 2023-07-02 MED ORDER — STERILE WATER FOR IRRIGATION IR SOLN
Status: DC | PRN
  Administered 2023-07-02: 3000 mL

## 2023-07-02 MED ORDER — OXYCODONE HCL 5 MG/5ML PO SOLN: 5.0000 mg | Freq: Once | ORAL | Status: DC | PRN

## 2023-07-02 SURGICAL SUPPLY — 58 items
BAG COUNTER SPONGE SURGICOUNT (BAG) ×1 IMPLANT
BANDAGE ESMARK 6X9 LF (GAUZE/BANDAGES/DRESSINGS) ×1 IMPLANT
BLADE SAG 18X100X1.27 (BLADE) ×1 IMPLANT
BNDG ELASTIC 6INX 5YD STR LF (GAUZE/BANDAGES/DRESSINGS) ×2 IMPLANT
BNDG ESMARK 6X9 LF (GAUZE/BANDAGES/DRESSINGS) ×1 IMPLANT
BOWL SMART MIX CTS (DISPOSABLE) IMPLANT
COMP FEM PS KNEE NRW 8 RT (Joint) ×1 IMPLANT
COMP PATELLA 3 PEG 29X9 (Joint) ×1 IMPLANT
COMPONENT FEM PS KNEE NRW 8 RT (Joint) IMPLANT
COMPONENT PATELLA 3 PEG 29X9 (Joint) IMPLANT
COOLER ICEMAN CLASSIC (MISCELLANEOUS) ×1 IMPLANT
COVER SURGICAL LIGHT HANDLE (MISCELLANEOUS) ×1 IMPLANT
CUFF TOURN SGL QUICK 42 (TOURNIQUET CUFF) IMPLANT
CUFF TRNQT CYL 34X4.125X (TOURNIQUET CUFF) ×1 IMPLANT
DRAPE EXTREMITY T 121X128X90 (DISPOSABLE) ×1 IMPLANT
DRAPE HALF SHEET 40X57 (DRAPES) ×1 IMPLANT
DRAPE U-SHAPE 47X51 STRL (DRAPES) ×1 IMPLANT
DURAPREP 26ML APPLICATOR (WOUND CARE) ×1 IMPLANT
ELECT CAUTERY BLADE 6.4 (BLADE) ×1 IMPLANT
ELECT REM PT RETURN 9FT ADLT (ELECTROSURGICAL) ×1 IMPLANT
ELECTRODE REM PT RTRN 9FT ADLT (ELECTROSURGICAL) ×1 IMPLANT
FACESHIELD WRAPAROUND (MASK) ×2 IMPLANT
FACESHIELD WRAPAROUND OR TEAM (MASK) ×2 IMPLANT
GAUZE PAD ABD 8X10 STRL (GAUZE/BANDAGES/DRESSINGS) ×1 IMPLANT
GAUZE SPONGE 4X4 12PLY STRL (GAUZE/BANDAGES/DRESSINGS) ×1 IMPLANT
GAUZE XEROFORM 1X8 LF (GAUZE/BANDAGES/DRESSINGS) ×1 IMPLANT
GLOVE BIOGEL PI IND STRL 8 (GLOVE) ×2 IMPLANT
GLOVE ORTHO TXT STRL SZ7.5 (GLOVE) ×1 IMPLANT
GLOVE SURG ORTHO 8.0 STRL STRW (GLOVE) ×1 IMPLANT
GOWN STRL REUS W/ TWL LRG LVL3 (GOWN DISPOSABLE) IMPLANT
GOWN STRL REUS W/ TWL XL LVL3 (GOWN DISPOSABLE) ×2 IMPLANT
IMMOBILIZER KNEE 22 UNIV (SOFTGOODS) ×1 IMPLANT
INSERT TIB ASF CD/8-9 12 RT (Insert) IMPLANT
INSERT TIB PERS SZ 8-9 10 RT (Insert) IMPLANT
IV NS 1000ML BAXH (IV SOLUTION) ×1 IMPLANT
KIT BASIN OR (CUSTOM PROCEDURE TRAY) ×1 IMPLANT
KIT TURNOVER KIT B (KITS) ×1 IMPLANT
MANIFOLD NEPTUNE II (INSTRUMENTS) ×1 IMPLANT
NDL 18GX1X1/2 (RX/OR ONLY) (NEEDLE) IMPLANT
NEEDLE 18GX1X1/2 (RX/OR ONLY) (NEEDLE) IMPLANT
NS IRRIG 1000ML POUR BTL (IV SOLUTION) ×1 IMPLANT
PACK TOTAL JOINT (CUSTOM PROCEDURE TRAY) ×1 IMPLANT
PAD ARMBOARD 7.5X6 YLW CONV (MISCELLANEOUS) ×1 IMPLANT
PAD COLD SHLDR WRAP-ON (PAD) ×1 IMPLANT
PADDING CAST COTTON 6X4 STRL (CAST SUPPLIES) ×1 IMPLANT
PIN DRILL HDLS TROCAR 75 4PK (PIN) IMPLANT
SET HNDPC FAN SPRY TIP SCT (DISPOSABLE) ×1 IMPLANT
SET PAD KNEE POSITIONER (MISCELLANEOUS) ×1 IMPLANT
STAPLER VISISTAT 35W (STAPLE) ×1 IMPLANT
STEM TIB PS KNEE D 0D RT (Stem) IMPLANT
SUCTION TUBE FRAZIER 10FR DISP (SUCTIONS) ×1 IMPLANT
SUT VIC AB 0 CT1 27XBRD ANBCTR (SUTURE) ×1 IMPLANT
SUT VIC AB 1 CT1 27XBRD ANBCTR (SUTURE) ×2 IMPLANT
SUT VIC AB 2-0 CT1 TAPERPNT 27 (SUTURE) ×2 IMPLANT
SYR 50ML LL SCALE MARK (SYRINGE) IMPLANT
TOWEL GREEN STERILE (TOWEL DISPOSABLE) ×1 IMPLANT
TOWEL GREEN STERILE FF (TOWEL DISPOSABLE) ×1 IMPLANT
TRAY CATH INTERMITTENT SS 16FR (CATHETERS) IMPLANT

## 2023-07-02 NOTE — Anesthesia Procedure Notes (Signed)
 Anesthesia Regional Block: Adductor canal block   Pre-Anesthetic Checklist: , timeout performed,  Correct Patient, Correct Site, Correct Laterality,  Correct Procedure, Correct Position, site marked,  Risks and benefits discussed,  Surgical consent,  Pre-op evaluation,  At surgeon's request and post-op pain management  Laterality: Right  Prep: chloraprep       Needles:  Injection technique: Single-shot  Needle Type: Echogenic Stimulator Needle     Needle Length: 9cm  Needle Gauge: 21     Additional Needles:   Procedures:,,,, ultrasound used (permanent image in chart),,    Narrative:  Start time: 07/02/2023 7:15 AM End time: 07/02/2023 7:18 AM Injection made incrementally with aspirations every 5 mL.  Performed by: Personally  Anesthesiologist: Linton Rump, MD  Additional Notes: Discussed risks and benefits of nerve block including, but not limited to, prolonged and/or permanent nerve injury involving sensory and/or motor function. Monitors were applied and a time-out was performed. The nerve and associated structures were visualized under ultrasound guidance. After negative aspiration, local anesthetic was slowly injected around the nerve. There was no evidence of high pressure during the procedure. There were no paresthesias. VSS remained stable and the patient tolerated the procedure well.

## 2023-07-02 NOTE — Discharge Instructions (Signed)
 Per [facility] clinic policy, our goal is ensure optimal postoperative pain control with a multimodal pain management strategy. For all [facility] patients, our goal is to wean post-operative narcotic medications by 6 weeks post-operatively. If this is not possible due to utilization of pain medication prior to surgery, your [facility] doctor will support your acute post-operative pain control for the first 6 weeks postoperatively, with a plan to transition you back to your primary pain team following that. Jean [name] will work to ensure a Therapist, occupational.  INSTRUCTIONS AFTER JOINT REPLACEMENT   Remove items at home which could result in a fall. This includes throw rugs or furniture in walking pathways ICE to the affected joint every three hours while awake for 30 minutes at a time, for at least the first 3-5 days, and then as needed for pain and swelling.  Continue to use ice for pain and swelling. You may notice swelling that will progress down to the foot and ankle.  This is normal after surgery.  Elevate your leg when you are not up walking on it.   Continue to use the breathing machine you got in the hospital (incentive spirometer) which will help keep your temperature down.  It is common for your temperature to cycle up and down following surgery, especially at night when you are not up moving around and exerting yourself.  The breathing machine keeps your lungs expanded and your temperature down.   DIET:  As you were doing prior to hospitalization, we recommend a well-balanced diet.  DRESSING / WOUND CARE / SHOWERING  Keep the surgical dressing until follow up.  The dressing is water proof, so you can shower without any extra covering.  IF THE DRESSING FALLS OFF or the wound gets wet inside, change the dressing with sterile gauze.  Please use good hand washing techniques before changing the dressing.  Do not use any lotions or creams on the incision until instructed by your surgeon.     ACTIVITY  Increase activity slowly as tolerated, but follow the weight bearing instructions below.   No driving for 6 weeks or until further direction given by your physician.  You cannot drive while taking narcotics.  No lifting or carrying greater than 10 lbs. until further directed by your surgeon. Avoid periods of inactivity such as sitting longer than an hour when not asleep. This helps prevent blood clots.  You may return to work once you are authorized by your doctor.     WEIGHT BEARING   Weight bearing as tolerated with assist device (walker, cane, etc) as directed, use it as long as suggested by your surgeon or therapist, typically at least 4-6 weeks.   EXERCISES  Results after joint replacement surgery are often greatly improved when you follow the exercise, range of motion and muscle strengthening exercises prescribed by your doctor. Safety measures are also important to protect the joint from further injury. Any time any of these exercises cause you to have increased pain or swelling, decrease what you are doing until you are comfortable again and then slowly increase them. If you have problems or questions, call your caregiver or physical therapist for advice.   Rehabilitation is important following a joint replacement. After just a few days of immobilization, the muscles of the leg can become weakened and shrink (atrophy).  These exercises are designed to build up the tone and strength of the thigh and leg muscles and to improve motion. Often times heat used for twenty to thirty minutes before  working out will loosen up your tissues and help with improving the range of motion but do not use heat for the first two weeks following surgery (sometimes heat can increase post-operative swelling).   These exercises can be done on a training (exercise) mat, on the floor, on a table or on a bed. Use whatever works the best and is most comfortable for you.    Use music or television  while you are exercising so that the exercises are a pleasant break in your day. This will make your life better with the exercises acting as a break in your routine that you can look forward to.   Perform all exercises about fifteen times, three times per day or as directed.  You should exercise both the operative leg and the other leg as well.  Exercises include:   Quad Sets - Tighten up the muscle on the front of the thigh (Quad) and hold for 5-10 seconds.   Straight Leg Raises - With your knee straight (if you were given a brace, keep it on), lift the leg to 60 degrees, hold for 3 seconds, and slowly lower the leg.  Perform this exercise against resistance later as your leg gets stronger.  Leg Slides: Lying on your back, slowly slide your foot toward your buttocks, bending your knee up off the floor (only go as far as is comfortable). Then slowly slide your foot back down until your leg is flat on the floor again.  Angel Wings: Lying on your back spread your legs to the side as far apart as you can without causing discomfort.  Hamstring Strength:  Lying on your back, push your heel against the floor with your leg straight by tightening up the muscles of your buttocks.  Repeat, but this time bend your knee to a comfortable angle, and push your heel against the floor.  You may put a pillow under the heel to make it more comfortable if necessary.   A rehabilitation program following joint replacement surgery can speed recovery and prevent re-injury in the future due to weakened muscles. Contact your doctor or a physical therapist for more information on knee rehabilitation.    CONSTIPATION  Constipation is defined medically as fewer than three stools per week and severe constipation as less than one stool per week.  Even if you have a regular bowel pattern at home, your normal regimen is likely to be disrupted due to multiple reasons following surgery.  Combination of anesthesia, postoperative  narcotics, change in appetite and fluid intake all can affect your bowels.   YOU MUST use at least one of the following options; they are listed in order of increasing strength to get the job done.  They are all available over the counter, and you may need to use some, POSSIBLY even all of these options:    Drink plenty of fluids (prune juice may be helpful) and high fiber foods Colace 100 mg by mouth twice a day  Senokot for constipation as directed and as needed Dulcolax (bisacodyl), take with full glass of water  Miralax (polyethylene glycol) once or twice a day as needed.  If you have tried all these things and are unable to have a bowel movement in the first 3-4 days after surgery call either your surgeon or your primary doctor.    If you experience loose stools or diarrhea, hold the medications until you stool forms back up.  If your symptoms do not get better within 1 week  or if they get worse, check with your doctor.  If you experience "the worst abdominal pain ever" or develop nausea or vomiting, please contact the office immediately for further recommendations for treatment.   ITCHING:  If you experience itching with your medications, try taking only a single pain pill, or even half a pain pill at a time.  You can also use Benadryl over the counter for itching or also to help with sleep.   TED HOSE STOCKINGS:  Use stockings on both legs until for at least 2 weeks or as directed by physician office. They may be removed at night for sleeping.  MEDICATIONS:  See your medication summary on the "After Visit Summary" that nursing will review with you.  You may have some home medications which will be placed on hold until you complete the course of blood thinner medication.  It is important for you to complete the blood thinner medication as prescribed.  PRECAUTIONS:  If you experience chest pain or shortness of breath - call 911 immediately for transfer to the hospital emergency department.    If you develop a fever greater that 101 F, purulent drainage from wound, increased redness or drainage from wound, foul odor from the wound/dressing, or calf pain - CONTACT YOUR SURGEON.                                                   FOLLOW-UP APPOINTMENTS:  If you do not already have a post-op appointment, please call the office for an appointment to be seen by your surgeon.  Guidelines for how soon to be seen are listed in your "After Visit Summary", but are typically between 1-4 weeks after surgery.  OTHER INSTRUCTIONS:   Knee Replacement:  Do not place pillow under knee, focus on keeping the knee straight while resting. CPM instructions: 0-90 degrees, 2 hours in the morning, 2 hours in the afternoon, and 2 hours in the evening. Place foam block, curve side up under heel at all times except when in CPM or when walking.  DO NOT modify, tear, cut, or change the foam block in any way.  POST-OPERATIVE OPIOID TAPER INSTRUCTIONS: It is important to wean off of your opioid medication as soon as possible. If you do not need pain medication after your surgery it is ok to stop day one. Opioids include: Codeine, Hydrocodone(Norco, Vicodin), Oxycodone(Percocet, oxycontin) and hydromorphone amongst others.  Long term and even short term use of opiods can cause: Increased pain response Dependence Constipation Depression Respiratory depression And more.  Withdrawal symptoms can include Flu like symptoms Nausea, vomiting And more Techniques to manage these symptoms Hydrate well Eat regular healthy meals Stay active Use relaxation techniques(deep breathing, meditating, yoga) Do Not substitute Alcohol to help with tapering If you have been on opioids for less than two weeks and do not have pain than it is ok to stop all together.  Plan to wean off of opioids This plan should start within one week post op of your joint replacement. Maintain the same interval or time between taking each dose  and first decrease the dose.  Cut the total daily intake of opioids by one tablet each day Next start to increase the time between doses. The last dose that should be eliminated is the evening dose.   MAKE SURE YOU:  Understand these instructions.  Get help right away if you are not doing well or get worse.    Thank you for letting us be a part of your medical care team.  It is a privilege we respect greatly.  We hope these instructions will help you stay on track for a fast and full recovery!      Dental Antibiotics:  In most cases prophylactic antibiotics for Dental procdeures after total joint surgery are not necessary.  Exceptions are as follows:  1. History of prior total joint infection  2. Severely immunocompromised (Organ Transplant, cancer chemotherapy, Rheumatoid biologic meds such as Humera)  3. Poorly controlled diabetes (A1C &gt; 8.0, blood glucose over 200)  If you have one of these conditions, contact your surgeon for an antibiotic prescription, prior to your dental procedure.

## 2023-07-02 NOTE — Evaluation (Signed)
 Physical Therapy Evaluation Patient Details Name: Jean [name] MRN: [id number] DOB: 04/14/1966 Today's Date: 07/02/2023  History of Present Illness  58 y.o. female presents to [facility] hospital on 07/02/2023 for elective R TKA. PMH: HTN, GERD, depression, MDD, COPD, and asthma.  Clinical Impression  Pt presents to PT with deficits in functional mobility, gait, balance, endurance, strength, power. Pt is able to transfer and ambulate with support of RW for short household distances despite reports of significant pain. PT provides education on TKR exercise packet as well as education on the need for knee extension when resting. PT will follow up tomorrow for further gait and stair training.         If plan is discharge home, recommend the following: A little help with bathing/dressing/bathroom;Assistance with cooking/housework;Assist for transportation;Help with stairs or ramp for entrance   Can travel by private vehicle        Equipment Recommendations BSC/3in1  Recommendations for Other Services       Functional Status Assessment Patient has had a recent decline in their functional status and demonstrates the ability to make significant improvements in function in a reasonable and predictable amount of time.     Precautions / Restrictions Precautions Precautions: Fall;Knee Precaution Booklet Issued: Yes (comment) Recall of Precautions/Restrictions: Intact Restrictions Weight Bearing Restrictions Per Provider Order: Yes RLE Weight Bearing Per Provider Order: Weight bearing as tolerated      Mobility  Bed Mobility Overal bed mobility: Needs Assistance Bed Mobility: Supine to Sit, Sit to Supine     Supine to sit: Min assist Sit to supine: Min assist   General bed mobility comments: support for RLE    Transfers Overall transfer level: Needs assistance Equipment used: Rolling walker (2 wheels) Transfers: Sit to/from Stand Sit to Stand: Contact guard assist                 Ambulation/Gait Ambulation/Gait assistance: Contact guard assist Gait Distance (Feet): 60 Feet Assistive device: Rolling walker (2 wheels) Gait Pattern/deviations: Step-to pattern Gait velocity: reduced Gait velocity interpretation: <1.31 ft/sec, indicative of household ambulator   General Gait Details: slowed step-to gait, reduced stance time on RLE, reduced terminal knee extension  Stairs            Wheelchair Mobility     Tilt Bed    Modified Rankin (Stroke Patients Only)       Balance Overall balance assessment: Needs assistance Sitting-balance support: No upper extremity supported, Feet supported Sitting balance-Leahy Scale: Good     Standing balance support: Bilateral upper extremity supported, Reliant on assistive device for balance Standing balance-Leahy Scale: Poor                               Pertinent Vitals/Pain Pain Assessment Pain Assessment: 0-10 Pain Score: 8  Pain Location: R knee Pain Descriptors / Indicators: Aching Pain Intervention(s): Premedicated before session    Home Living Family/patient expects to be discharged to:: Private residence Living Arrangements: Spouse/significant other Available Help at Discharge: Family;Available PRN/intermittently Type of Home: House Home Access: Stairs to enter Entrance Stairs-Rails: Left Entrance Stairs-Number of Steps: 2   Home Layout: One level Home Equipment: Agricultural consultant (2 wheels);Rollator (4 wheels);Cane - single point      Prior Function Prior Level of Function : Independent/Modified Independent;Driving             Mobility Comments: ambulatory with rollator       Extremity/Trunk Assessment  Upper Extremity Assessment Upper Extremity Assessment: Overall WFL for tasks assessed    Lower Extremity Assessment Lower Extremity Assessment: RLE deficits/detail RLE Deficits / Details: generalized post-op weakness and ROM deficits as expected s/p TKA     Cervical / Trunk Assessment Cervical / Trunk Assessment: Normal  Communication   Communication Communication: No apparent difficulties    Cognition Arousal: Alert Behavior During Therapy: WFL for tasks assessed/performed   PT - Cognitive impairments: No apparent impairments                         Following commands: Intact       Cueing Cueing Techniques: Verbal cues     General Comments General comments (skin integrity, edema, etc.): VSS on RA    Exercises Other Exercises Other Exercises: PT provides education on TKR exercise packet   Assessment/Plan    PT Assessment Patient needs continued PT services  PT Problem List Decreased strength;Decreased range of motion;Decreased activity tolerance;Decreased balance;Decreased mobility;Decreased safety awareness;Decreased knowledge of use of DME;Pain       PT Treatment Interventions DME instruction;Gait training;Stair training;Functional mobility training;Therapeutic activities;Therapeutic exercise;Balance training;Neuromuscular re-education;Patient/family education    PT Goals (Current goals can be found in the Care Plan section)  Acute Rehab PT Goals Patient Stated Goal: to return to independence PT Goal Formulation: With patient Time For Goal Achievement: 07/06/23 Potential to Achieve Goals: Good    Frequency 7X/week     Co-evaluation               AM-PAC PT "6 Clicks" Mobility  Outcome Measure Help needed turning from your back to your side while in a flat bed without using bedrails?: A Little Help needed moving from lying on your back to sitting on the side of a flat bed without using bedrails?: A Little Help needed moving to and from a bed to a chair (including a wheelchair)?: A Little Help needed standing up from a chair using your arms (e.g., wheelchair or bedside chair)?: A Little Help needed to walk in hospital room?: A Little Help needed climbing 3-5 steps with a railing? : Total 6 Click  Score: 16    End of Session Equipment Utilized During Treatment: Gait belt Activity Tolerance: Patient tolerated treatment well Patient left: in bed;with call bell/phone within reach;with bed alarm set Nurse Communication: Mobility status PT Visit Diagnosis: Other abnormalities of gait and mobility (R26.89);Muscle weakness (generalized) (M62.81);Pain Pain - Right/Left: Right Pain - part of body: Knee    Time: 9604-5409 PT Time Calculation (min) (ACUTE ONLY): 23 min   Charges:   PT Evaluation $PT Eval Low Complexity: 1 Low   PT General Charges $$ ACUTE PT VISIT: 1 Visit         Arlyss Gandy, PT, DPT Acute Rehabilitation Office [phone]   Arlyss Gandy 07/02/2023, 4:24 PM

## 2023-07-02 NOTE — Op Note (Signed)
 Operative Note  Date of operation: 07/02/2023 Preoperative diagnosis: Right knee primary osteoarthritis Postoperative diagnosis: Same  Procedure: Right press-fit total knee arthroplasty  Implants: Biomet/Zimmer persona press-fit knee system Implant Name Type Inv. Item Serial No. Manufacturer Lot No. LRB No. Used Action  STEM TIB PS KNEE D 0D RT - [id number] Stem STEM TIB PS KNEE D 0D RT  ZIMMER RECON(ORTH,TRAU,BIO,SG) [id number] Right 1 Implanted  COMP FEM PS KNEE NRW 8 RT - [id number] Joint COMP FEM PS KNEE NRW 8 RT  ZIMMER RECON(ORTH,TRAU,BIO,SG) [id number] Right 1 Implanted  COMP PATELLA 3 PEG 29X9 - [id number] Joint COMP PATELLA 3 PEG 29X9  ZIMMER RECON(ORTH,TRAU,BIO,SG) [id number] Right 1 Implanted  INSERT TIB ASF CD/8-9 12 RT - [id number] Insert INSERT TIB ASF CD/8-9 12 RT  ZIMMER RECON(ORTH,TRAU,BIO,SG) [id number] Right 1 Implanted   Surgeon: Vanita Panda. Magnus Ivan, MD Assistant: Rexene Edison, PA-C  Anesthesia: #1 right lower extremity adductor canal block, #2 spinal, #3 local Tourniquet time: 38 minutes EBL: Less than 50 cc Antibiotics: IV Ancef Complications: None  Indications: The patient is a 58 year old female with debilitating arthritis involving her right knee.  She has tried and failed conservative treatment for over a year.  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  At this point her right knee pain is daily and it is detrimentally fighting her mobility, her quality of life and her actives daily living to the point she was to proceed with a knee replacement and we agree with this as well.  The risks of acute blood loss anemia, nerve or vessel injury, fracture, infection, DVT, implant failure and wound healing issues were discussed.  We talked about our goals being hopefully decreased pain, improved mobility and improved quality of life.  Procedure description: After informed consent was obtained and the appropriate right knee was marked, anesthesia obtained a right lower  extremity adductor canal block in the holding room.  The patient was then brought to the operating room and set up on the operative table where spinal anesthesia was obtained.  She was laid in for sign position on the operating table and a nonsterile tourniquet is placed on her upper right thigh.  Her right thigh, knee, leg and ankle were prepped and draped with DuraPrep and sterile drapes including a sterile stockinette.  A timeout was called and she was then identified as the correct patient the correct right knee.  An Esmarch was then used to wrap out the leg and the tourniquet was inflated to 300 mm pressure.  With the knee extended a direct midline incision was made over the patella and carried proximally distally.  Dissection was carried down to the knee joint and a medial parapatellar arthrotomy was made.  There was moderate joint effusion encountered and significant synovitis.  With the knee in a flexed position we removed osteophytes in all 3 compartments as well as remnants of the ACL and medial lateral meniscus.  Using an extramedullary based cutting guide for making our proximal tibia cut we corrected for varus and valgus and a 7 degree slope.  We made this cut to take 2 mm off the low side and we did back down to more millimeters.  We then used an intramedullary based cutting guide for distal femur cut setting this for right knee at 5 degrees externally rotated and a 10 mm distal femoral cut.  We made that cut without difficulty and brought the knee back down to full extension and with a 10 mm extension block at  achieve full extension.  We then back to the femur and placed a femoral sizing guide based off the epicondylar axis.  With this we chose a size 8 femur.  We put a 4-in-1 cutting block for a size 8 femur and made our anterior and posterior cuts followed our chamfer cuts.  We then backed the tibia and chose a size D right tibial tray for coverage of the tibial plateau setting the rotation of the  tibial tubercle and the femur.  We did our drill hole and keel punch over this and found good quality bone for press-fit implants.  We trialed our size D right tibia combined with our size 8 right CR standard femur.  We placed a 10 mm medial congruent polythene insert and we are pleased with range of motion and stability without insert.  We did decide to go with a narrow femur.  We then made our patella cut and drilled 3 holes for a size 29 patella button again were pleased the range of motion and stability with all trial components in place.  We then removed all trial components and irrigated the knee with normal saline solution using pulsatile lavage.  We dried the knee really well and then placed Marcaine with epinephrine around the arthrotomy.  Next with the knee in a flexed position we placed our Biomet/Zimmer persona tibial tray for right knee which was press-fit size D followed by our size 8 CR narrow right femur.  We then placed our 10 mm medial congruent polythene insert and once we did that we felt like we needed to go up on insert size so we remove that 10 mm insert and trialed for 12 insert and we are pleased with the stability with a 12 mm thickness right polythene insert.  We then placed the real insert.  We then press-fit our size 29 patella button.  Again with all final components in the knee we are pleased with range of motion and stability.  We then let the tourniquet down and hemostasis was obtained with electrocautery.  The arthrotomy was then closed with interrupted #1 Vicryl suture followed by 0 Vicryl goes deep tissue and 2-0 Vicryl close subcutaneous tissue.  The skin was closed with staples.  Well-padded sterile dressing was applied.  The patient was taken recovery room in stable condition.  Rexene Edison, PA-C did assist during the entire case and beginning to end and his assistance was crucial and medically necessary for soft tissue management and retraction, helping guide implant placement  and a layered closure of the wound.

## 2023-07-02 NOTE — Anesthesia Postprocedure Evaluation (Signed)
 Anesthesia Post Note  Patient: Jean [name]  Procedure(s) Performed: RIGHT TOTAL KNEE ARTHROPLASTY (Right: Knee)     Patient location during evaluation: PACU Anesthesia Type: MAC and Spinal Level of consciousness: awake Pain management: pain level controlled Vital Signs Assessment: post-procedure vital signs reviewed and stable Respiratory status: spontaneous breathing, respiratory function stable and nonlabored ventilation Cardiovascular status: blood pressure returned to baseline and stable Postop Assessment: no headache, no backache and no apparent nausea or vomiting Anesthetic complications: no   No notable events documented.  Last Vitals:  Vitals:   07/02/23 1215 07/02/23 1230  BP: (!) 163/79 (!) 174/97  Pulse: 60 (!) 48  Resp: (!) 21 10  Temp:    SpO2: 100% 100%    Last Pain:  Vitals:   07/02/23 1230  TempSrc:   PainSc: Asleep                 Linton Rump

## 2023-07-02 NOTE — Plan of Care (Signed)

## 2023-07-02 NOTE — Anesthesia Procedure Notes (Signed)
 Spinal  Patient location during procedure: OR Start time: 07/02/2023 7:37 AM End time: 07/02/2023 7:39 AM Reason for block: surgical anesthesia Staffing Performed: anesthesiologist  Anesthesiologist: Linton Rump, MD Performed by: Linton Rump, MD Authorized by: Linton Rump, MD   Preanesthetic Checklist Completed: patient identified, IV checked, site marked, risks and benefits discussed, surgical consent, monitors and equipment checked, pre-op evaluation and timeout performed Spinal Block Patient position: sitting Prep: DuraPrep Patient monitoring: blood pressure and continuous pulse ox Approach: midline Location: L3-4 Injection technique: single-shot Needle Needle type: Pencan  Needle gauge: 24 G Needle length: 9 cm Additional Notes Risks and benefits of neuraxial anesthesia including, but not limited to, infection, bleeding, local anesthetic toxicity, headache, hypotension, back pain, block failure, etc. were discussed with the patient. The patient expressed understanding and consented to the procedure. I confirmed that the patient has no bleeding disorders and is not taking blood thinners. I confirmed the patient's last platelet count with the nurse. Monitors were applied. A time-out was performed immediately prior to the procedure. Sterile technique was used throughout the whole procedure.   1 attempt(s)

## 2023-07-02 NOTE — Interval H&P Note (Signed)
 History and Physical Interval Note: The patient understands that she is here today for right total knee replacement to treat her significant right knee pain and arthritis.  There has been no acute or interval change in her medical status.  The risks and benefits of surgery have been discussed in detail and informed consent has been obtained.  The right operative knee has been marked.  07/02/2023 6:58 AM  Jean [name]  has presented today for surgery, with the diagnosis of right knee osteoarthritis.  The various methods of treatment have been discussed with the patient and family. After consideration of risks, benefits and other options for treatment, the patient has consented to  Procedure(s): RIGHT TOTAL KNEE ARTHROPLASTY (Right) as a surgical intervention.  The patient's history has been reviewed, patient examined, no change in status, stable for surgery.  I have reviewed the patient's chart and labs.  Questions were answered to the patient's satisfaction.     Kathryne Hitch

## 2023-07-02 NOTE — Transfer of Care (Signed)
 Immediate Anesthesia Transfer of Care Note  Patient: SHALAMAR PLOURDE  Procedure(s) Performed: RIGHT TOTAL KNEE ARTHROPLASTY (Right: Knee)  Patient Location: PACU  Anesthesia Type:Spinal  Level of Consciousness: awake, alert , and oriented  Airway & Oxygen Therapy: Patient Spontanous Breathing and Patient connected to face mask oxygen  Post-op Assessment: Report given to RN and Post -op Vital signs reviewed and stable  Post vital signs: Reviewed and stable  Last Vitals:  Vitals Value Taken Time  BP 155/99 07/02/23 0908  Temp    Pulse 57 07/02/23 0909  Resp 7 07/02/23 0910  SpO2 89 % 07/02/23 0909  Vitals shown include unfiled device data.  Last Pain:  Vitals:   07/02/23 0642  TempSrc:   PainSc: 0-No pain         Complications: No notable events documented.

## 2023-07-03 ENCOUNTER — Ambulatory Visit: Payer: Medicaid Other | Admitting: Licensed Clinical Social Worker

## 2023-07-03 ENCOUNTER — Encounter (HOSPITAL_COMMUNITY): Payer: Self-pay | Admitting: Orthopaedic Surgery

## 2023-07-03 DIAGNOSIS — M1711 Unilateral primary osteoarthritis, right knee: Secondary | ICD-10-CM | POA: Diagnosis not present

## 2023-07-03 LAB — BASIC METABOLIC PANEL
Anion gap: 11 (ref 5–15)
BUN: 16 mg/dL (ref 6–20)
CO2: 22 mmol/L (ref 22–32)
Calcium: 10.2 mg/dL (ref 8.9–10.3)
Chloride: 101 mmol/L (ref 98–111)
Creatinine, Ser: 1.29 mg/dL — ABNORMAL HIGH (ref 0.44–1.00)
GFR, Estimated: 48 mL/min — ABNORMAL LOW (ref >60)
Glucose, Bld: 125 mg/dL — ABNORMAL HIGH (ref 70–99)
Potassium: 4.6 mmol/L (ref 3.5–5.1)
Sodium: 134 mmol/L — ABNORMAL LOW (ref 135–145)

## 2023-07-03 LAB — CBC
HCT: 37.5 % (ref 36.0–46.0)
Hemoglobin: 12.3 g/dL (ref 12.0–15.0)
MCH: 31.6 pg (ref 26.0–34.0)
MCHC: 32.8 g/dL (ref 30.0–36.0)
MCV: 96.4 fL (ref 80.0–100.0)
Platelets: 331 10*3/uL (ref 150–400)
RBC: 3.89 MIL/uL (ref 3.87–5.11)
RDW: 13.2 % (ref 11.5–15.5)
WBC: 10.3 10*3/uL (ref 4.0–10.5)
nRBC: 0 % (ref 0.0–0.2)

## 2023-07-03 MED ORDER — HYDRALAZINE HCL 20 MG/ML IJ SOLN
20.0000 mg | Freq: Once | INTRAMUSCULAR | Status: AC
  Administered 2023-07-03: 20 mg via INTRAVENOUS
  Filled 2023-07-03: qty 1

## 2023-07-03 MED ORDER — HYDRALAZINE HCL 20 MG/ML IJ SOLN: 10.0000 mg | Freq: Four times a day (QID) | INTRAMUSCULAR | Status: DC | PRN

## 2023-07-03 NOTE — Progress Notes (Signed)
 Physical Therapy Treatment Patient Details Name: Jean [name] MRN: [id number] DOB: [birth year] Today's Date: 07/03/2023   History of Present Illness 58 y.o. female presents to [facility] hospital on 07/02/2023 for elective R TKA. PMH: HTN, GERD, depression, MDD, COPD, and asthma.    PT Comments  Pt tolerates treatment well, ambulating with good stability and progressing to stair training. PT provides reinforcement of knee extension exercise and places a towel under pt's distal RLE at rest to aide in passive knee extension when resting. Pt is mobilizing well and is able to perform all mobility required in the home setting. Pt reports she will have assistance for stair negotiation at the time of discharge.    If plan is discharge home, recommend the following: A little help with bathing/dressing/bathroom;Assistance with cooking/housework;Assist for transportation;Help with stairs or ramp for entrance   Can travel by private vehicle        Equipment Recommendations  BSC/3in1    Recommendations for Other Services       Precautions / Restrictions Precautions Precautions: Fall;Knee Precaution Booklet Issued: Yes (comment) Recall of Precautions/Restrictions: Intact Restrictions Weight Bearing Restrictions Per Provider Order: Yes RLE Weight Bearing Per Provider Order: Weight bearing as tolerated     Mobility  Bed Mobility Overal bed mobility: Needs Assistance Bed Mobility: Supine to Sit     Supine to sit: Supervision     General bed mobility comments: pt utilizing LLE to assist RLE    Transfers Overall transfer level: Needs assistance Equipment used: Rolling walker (2 wheels) Transfers: Sit to/from Stand Sit to Stand: Supervision                Ambulation/Gait Ambulation/Gait assistance: Supervision Gait Distance (Feet): 150 Feet Assistive device: Rolling walker (2 wheels) Gait Pattern/deviations: Step-through pattern Gait velocity: reduced Gait velocity  interpretation: <1.8 ft/sec, indicate of risk for recurrent falls   General Gait Details: slowed step-through gait, reduced terminal knee extension of RLE   Stairs Stairs: Yes Stairs assistance: Min assist Stair Management: One rail Left, Step to pattern, Forwards Number of Stairs: 2     Wheelchair Mobility     Tilt Bed    Modified Rankin (Stroke Patients Only)       Balance Overall balance assessment: Needs assistance Sitting-balance support: No upper extremity supported, Feet supported Sitting balance-Leahy Scale: Good     Standing balance support: Single extremity supported, Reliant on assistive device for balance Standing balance-Leahy Scale: Poor                              Communication Communication Communication: No apparent difficulties  Cognition Arousal: Alert Behavior During Therapy: WFL for tasks assessed/performed   PT - Cognitive impairments: No apparent impairments                         Following commands: Intact      Cueing Cueing Techniques: Verbal cues  Exercises Total Joint Exercises Quad Sets: AROM, Right, 10 reps Goniometric ROM: 65 degrees flexion, -10 degrees extension Other Exercises Other Exercises: PT encouraging further quad sets, also places towel under distal RLE to aide in promoting knee extension when resting    General Comments General comments (skin integrity, edema, etc.): VSS on RA      Pertinent Vitals/Pain Pain Assessment Pain Assessment: Faces Faces Pain Scale: Hurts little more Pain Location: R knee Pain Descriptors / Indicators: Sore Pain Intervention(s): Monitored during session  Home Living                          Prior Function            PT Goals (current goals can now be found in the care plan section) Acute Rehab PT Goals Patient Stated Goal: to return to independence Progress towards PT goals: Progressing toward goals    Frequency    7X/week       PT Plan      Co-evaluation              AM-PAC PT "6 Clicks" Mobility   Outcome Measure  Help needed turning from your back to your side while in a flat bed without using bedrails?: A Little Help needed moving from lying on your back to sitting on the side of a flat bed without using bedrails?: A Little Help needed moving to and from a bed to a chair (including a wheelchair)?: A Little Help needed standing up from a chair using your arms (e.g., wheelchair or bedside chair)?: A Little Help needed to walk in hospital room?: A Little Help needed climbing 3-5 steps with a railing? : A Little 6 Click Score: 18    End of Session Equipment Utilized During Treatment: Gait belt Activity Tolerance: Patient tolerated treatment well Patient left: in chair;with call bell/phone within reach Nurse Communication: Mobility status PT Visit Diagnosis: Other abnormalities of gait and mobility (R26.89);Muscle weakness (generalized) (M62.81);Pain Pain - Right/Left: Right Pain - part of body: Knee     Time: 8119-1478 PT Time Calculation (min) (ACUTE ONLY): 23 min  Charges:    $Gait Training: 23-37 mins PT General Charges $$ ACUTE PT VISIT: 1 Visit                     Jean [name], PT, DPT Acute Rehabilitation Office [phone]    Jean [name] 07/03/2023, 4:13 PM

## 2023-07-03 NOTE — Progress Notes (Signed)
 CSW met with pt regarding SDOH: food, utilities. Food: pt does receive $100 per month food stamps, does still run out at the end of the month. She is connected with [employer] and does use their food pantry.  CSW provided list with 2 additional [place] food pantries on it. Utilities: pt lives in boarding house.  Room rent includes utilities.  Pt was approved for disability somewhat recently.  She had gotten behind on the rent before that but she now has steady income and is paying this back.  Her utilities are not in danger at this time. Daleen Squibb, MSW, LCSW 2/26/20252:41 PM

## 2023-07-03 NOTE — Progress Notes (Signed)
 Subjective: 1 Day Post-Op Procedure(s) (LRB): RIGHT TOTAL KNEE ARTHROPLASTY (Right) Patient reports pain as moderate.    Objective: Vital signs in last 24 hours: Temp:  [96.2 F (35.7 C)-99.8 F (37.7 C)] 99.8 F (37.7 C) (02/26 0427) Pulse Rate:  [40-83] 83 (02/26 0427) Resp:  [9-29] 18 (02/26 0427) BP: (136-194)/(77-112) 175/103 (02/26 0427) SpO2:  [97 %-100 %] 97 % (02/26 0427)  Intake/Output from previous day: 02/25 0701 - 02/26 0700 In: 1236.6 [I.V.:1003.6; IV Piggyback:233] Out: 1375 [Urine:1350; Blood:25] Intake/Output this shift: No intake/output data recorded.  Recent Labs    07/03/23 0606  HGB 12.3   Recent Labs    07/03/23 0606  WBC 10.3  RBC 3.89  HCT 37.5  PLT 331   Recent Labs    07/03/23 0606  NA 134*  K 4.6  CL 101  CO2 22  BUN 16  CREATININE 1.29*  GLUCOSE 125*  CALCIUM 10.2   No results for input(s): "LABPT", "INR" in the last 72 hours.  Sensation intact distally Intact pulses distally Dorsiflexion/Plantar flexion intact Incision: dressing C/D/I Compartment soft   Assessment/Plan: 1 Day Post-Op Procedure(s) (LRB): RIGHT TOTAL KNEE ARTHROPLASTY (Right) Up with therapy      Kathryne Hitch 07/03/2023, 7:36 AM

## 2023-07-03 NOTE — Plan of Care (Signed)

## 2023-07-03 NOTE — Progress Notes (Signed)
 Physical Therapy Treatment Patient Details Name: Jean [name] MRN: [id number] DOB: August 06, 1965 Today's Date: 07/03/2023   History of Present Illness 58 y.o. female presents to [facility] hospital on 07/02/2023 for elective R TKA. PMH: HTN, GERD, depression, MDD, COPD, and asthma.    PT Comments  Pt tolerates treatment well, ambulating for increased distances and requiring reduced assistance for bed mobility. PT reinforces HEP to aide in improving both knee extension and flexion ROM. Pt will benefit from initiation of stair training this afternoon.    If plan is discharge home, recommend the following: A little help with bathing/dressing/bathroom;Assistance with cooking/housework;Assist for transportation;Help with stairs or ramp for entrance   Can travel by private vehicle        Equipment Recommendations  BSC/3in1 (pt already owns a 2 wheeled RW)    Recommendations for Other Services       Precautions / Restrictions Precautions Precautions: Fall;Knee Precaution Booklet Issued: Yes (comment) Recall of Precautions/Restrictions: Intact Restrictions Weight Bearing Restrictions Per Provider Order: Yes RLE Weight Bearing Per Provider Order: Weight bearing as tolerated     Mobility  Bed Mobility Overal bed mobility: Needs Assistance Bed Mobility: Supine to Sit     Supine to sit: Supervision     General bed mobility comments: verbal cues to utilize LLE to assist RLE    Transfers Overall transfer level: Needs assistance Equipment used: Rolling walker (2 wheels) Transfers: Sit to/from Stand Sit to Stand: Contact guard assist                Ambulation/Gait Ambulation/Gait assistance: Supervision Gait Distance (Feet): 150 Feet Assistive device: Rolling walker (2 wheels) Gait Pattern/deviations: Step-through pattern Gait velocity: reduced Gait velocity interpretation: <1.8 ft/sec, indicate of risk for recurrent falls   General Gait Details: slowed step-through gait,  reduced terminal knee extension of RLE   Stairs             Wheelchair Mobility     Tilt Bed    Modified Rankin (Stroke Patients Only)       Balance Overall balance assessment: Needs assistance Sitting-balance support: No upper extremity supported, Feet supported Sitting balance-Leahy Scale: Good     Standing balance support: Bilateral upper extremity supported, Reliant on assistive device for balance Standing balance-Leahy Scale: Poor                              Communication Communication Communication: No apparent difficulties  Cognition Arousal: Alert Behavior During Therapy: WFL for tasks assessed/performed   PT - Cognitive impairments: No apparent impairments                         Following commands: Intact      Cueing Cueing Techniques: Verbal cues  Exercises Total Joint Exercises Goniometric ROM: 65 degrees flexion, -10 degrees extension Other Exercises Other Exercises: PT provides reinforcement on TKR exercise packet, encourages quad sets and heels slides this morning    General Comments General comments (skin integrity, edema, etc.): VSS on RA      Pertinent Vitals/Pain Pain Assessment Pain Assessment: Faces Faces Pain Scale: Hurts even more Pain Location: R knee Pain Descriptors / Indicators: Aching Pain Intervention(s): Monitored during session    Home Living                          Prior Function  PT Goals (current goals can now be found in the care plan section) Acute Rehab PT Goals Patient Stated Goal: to return to independence Progress towards PT goals: Progressing toward goals    Frequency    7X/week      PT Plan      Co-evaluation              AM-PAC PT "6 Clicks" Mobility   Outcome Measure  Help needed turning from your back to your side while in a flat bed without using bedrails?: A Little Help needed moving from lying on your back to sitting on the side of  a flat bed without using bedrails?: A Little Help needed moving to and from a bed to a chair (including a wheelchair)?: A Little Help needed standing up from a chair using your arms (e.g., wheelchair or bedside chair)?: A Little Help needed to walk in hospital room?: A Little Help needed climbing 3-5 steps with a railing? : A Lot 6 Click Score: 17    End of Session Equipment Utilized During Treatment: Gait belt Activity Tolerance: Patient tolerated treatment well Patient left: in chair;with call bell/phone within reach Nurse Communication: Mobility status PT Visit Diagnosis: Other abnormalities of gait and mobility (R26.89);Muscle weakness (generalized) (M62.81);Pain Pain - Right/Left: Right Pain - part of body: Knee     Time: 0817-0840 PT Time Calculation (min) (ACUTE ONLY): 23 min  Charges:    $Gait Training: 8-22 mins $Therapeutic Activity: 8-22 mins PT General Charges $$ ACUTE PT VISIT: 1 Visit                     Arlyss Gandy, PT, DPT Acute Rehabilitation Office [phone]    Arlyss Gandy 07/03/2023, 9:14 AM

## 2023-07-04 DIAGNOSIS — M1711 Unilateral primary osteoarthritis, right knee: Secondary | ICD-10-CM | POA: Diagnosis not present

## 2023-07-04 MED ORDER — ASPIRIN 81 MG PO CHEW: 81.0000 mg | CHEWABLE_TABLET | Freq: Two times a day (BID) | ORAL | 0 refills | Status: AC

## 2023-07-04 MED ORDER — CYCLOBENZAPRINE HCL 10 MG PO TABS: 10.0000 mg | ORAL_TABLET | Freq: Three times a day (TID) | ORAL | 3 refills | Status: DC | PRN

## 2023-07-04 MED ORDER — OXYCODONE HCL 5 MG PO TABS
5.0000 mg | ORAL_TABLET | Freq: Four times a day (QID) | ORAL | 0 refills | Status: DC | PRN
Start: 2023-07-04 — End: 2023-07-17

## 2023-07-04 NOTE — TOC Transition Note (Addendum)
 Transition of Care [facility]) - Discharge Note   Patient Details  Name: Jean [name] MRN: [id number] Date of Birth: 1965/07/23  [facility] CM/SW Contact:  Epifanio Lesches, RN Phone Number: 07/04/2023, 12:34 PM   Clinical Narrative:    Patient will DC to: home Anticipated DC date: 07/04/2023 Family notified: yes Transport by:car   S/p R TKA, 2/25  Per MD patient ready for DC today. RN, patient, and patient's family notified of DC.  Orders noted for home health services and DME. Pt agreeable, no provider preference. Referral made with [facility] for DME ( RW) and referral made with [facility] for HHPT and accepted, [facility] 3/2 ( MD aware). DME, RW will be delivered to bedside by [facility]. BSC provided to pt , extra in NCM office. Pt without RX med concerns. Pt states will pick RX meds from local pharmacy. Friend to provide transportation to home.   RNCM will sign off for now as intervention is no longer needed. Please consult [place] again if new needs arise.    Final next level of care: Home w Home Health Services Barriers to Discharge: No Barriers Identified   Patient Goals and CMS Choice     Choice offered to / list presented to : Patient      Discharge Placement                       Discharge Plan and Services Additional resources added to the After Visit Summary for                    DME Agency: [facility] Date DME Agency Contacted: 07/04/23 Time DME Agency Contacted: 1229 Representative spoke with at DME Agency: Ian Malkin HH Arranged: PT HH Agency: [facility] Date HH Agency Contacted: 07/04/23 Time HH Agency Contacted: 1229 Representative spoke with at [facility] Agency: Adele Dan  Social Drivers of Health (SDOH) Interventions SDOH Screenings   Food Insecurity: Food Insecurity Present (07/02/2023)  Housing: Low Risk  (07/02/2023)  Transportation Needs: No Transportation Needs (07/02/2023)  Utilities: At Risk  (07/02/2023)  Alcohol Screen: Low Risk  (06/20/2023)  Depression (PHQ2-9): High Risk (06/05/2023)  Financial Resource Strain: High Risk (06/13/2023)  Social Connections: Socially Isolated (06/05/2023)  Stress: Stress Concern Present (06/13/2023)  Tobacco Use: Medium Risk (07/02/2023)  Health Literacy: Adequate Health Literacy (06/20/2023)     Readmission Risk Interventions     No data to display

## 2023-07-04 NOTE — Progress Notes (Signed)
 Patient discharge via private transportation, discharge packet reviewed at bedside. IV access removed, patient was suppose to get rolling walker per case managers note they sent in a request no walker has got delivered at bedside upon discharge. Suggested patient to call in the morning to see if they can route the walker to patients home. Patient did discharge with BSC and Rolator walker.

## 2023-07-04 NOTE — Progress Notes (Addendum)
 Physical Therapy Treatment Patient Details Name: Jean [name] MRN: [id number] DOB: 1966/02/06 Today's Date: 07/04/2023   History of Present Illness 58 y.o. female presents to [facility] hospital on 07/02/2023 for elective R TKA. PMH: HTN, GERD, depression, MDD, COPD, and asthma.    PT Comments  Pt received in supine, agreeable to therapy session and with good participation in gait and transfer training, pt limited in R knee flexion/extension ROM due to guarding/pain. PTA gives increased emphasis on aggressive ROM/HEP compliance due to pt with poor tolerance for sitting with knee flexed and R knee flexion ROM <60 deg today, as well as use of iceman, and importance of asking to her network for increased assist after surgery as she will need more help than usual. Case mgmt to room during session and notified of pt DME needs and post-acute rehab needs, discussed also with supervising PT Doreene Nest. Pt continues to benefit from PT services to progress toward functional mobility goals.    If plan is discharge home, recommend the following: A little help with bathing/dressing/bathroom;Assistance with cooking/housework;Assist for transportation;Help with stairs or ramp for entrance   Can travel by private vehicle        Equipment Recommendations  BSC/3in1;Rolling walker (2 wheels)    Recommendations for Other Services       Precautions / Restrictions Precautions Precautions: Fall;Knee Precaution Booklet Issued: Yes (comment) Recall of Precautions/Restrictions: Intact Precaution/Restrictions Comments: strong emphasis on importance of knee extension at rest and knee flexion throughout the day/HEP as pt with limited R knee ROM during exercises and not tolerating resting with her knee bent well Restrictions Weight Bearing Restrictions Per Provider Order: Yes RLE Weight Bearing Per Provider Order: Weight bearing as tolerated     Mobility  Bed Mobility Overal bed mobility: Needs Assistance Bed Mobility:  Supine to Sit     Supine to sit: Supervision     General bed mobility comments: pt utilizing LLE to assist RLE, pt also shown how to use gait belt as a leg lifter PRN and was able to use it to advance her leg more while sitting EOB as knee flexion remains painful and pt self-limiting/guarding RLE and with limited tolerance for RLE in dependent posture today due to knee pain, RN notified.    Transfers Overall transfer level: Needs assistance Equipment used: Rolling walker (2 wheels) Transfers: Sit to/from Stand Sit to Stand: Supervision           General transfer comment: Cues for safety and RLE advanced prior to sitting/standing.    Ambulation/Gait Ambulation/Gait assistance: Supervision Gait Distance (Feet): 125 Feet Assistive device: Rolling walker (2 wheels) Gait Pattern/deviations: Step-to pattern, Decreased weight shift to right, Decreased step length - left, Antalgic Gait velocity: reduced Gait velocity interpretation: <1.8 ft/sec, indicate of risk for recurrent falls   General Gait Details: slowed step-through gait, reduced terminal knee extension of RLE, reduced R knee flexion during swing phase, cues for step-to vs step-through depending on pain level, pt receptive. slightly narrow BOS with pt cued for widening her stance when turning for safety.   Stairs         General stair comments: pt defers due to pain.   Wheelchair Mobility     Tilt Bed    Modified Rankin (Stroke Patients Only)       Balance Overall balance assessment: Needs assistance Sitting-balance support: No upper extremity supported, Feet supported Sitting balance-Leahy Scale: Good     Standing balance support: Reliant on assistive device for balance, Bilateral upper  extremity supported Standing balance-Leahy Scale: Poor Standing balance comment: Fair using RW, poor unsupported due to R knee pain                            Communication Communication Communication: No  apparent difficulties  Cognition Arousal: Alert Behavior During Therapy: WFL for tasks assessed/performed   PT - Cognitive impairments: No apparent impairments                       PT - Cognition Comments: Decreased insight into need for assist after surgery, pt states last time her sister helped her "a couple days" (in 2017 after hip surgery) but now cannot help her as her sister has cancer. Pt encouraged to reach out to friends/family for assist initially and to continue aggressive compliance with RLE HEP program to regain function/ROM. Following commands: Intact      Cueing Cueing Techniques: Verbal cues  Exercises Total Joint Exercises Ankle Circles/Pumps: AROM, Both, 10 reps, Supine Quad Sets: AROM, 10 reps, Both, AAROM, Supine (tactile cues to encourage better technique) Towel Squeeze: AROM, Both, 5 reps, Supine Heel Slides: AROM, AAROM, Right, 10 reps, Supine (cues for pausing, pursed-lip breathing, then continuing to bend more) Hip ABduction/ADduction: AROM, AAROM, Right, 10 reps, Supine (pt shown how to use gait belt PRN for improved ROM and plastic bag under her leg for less friction) Straight Leg Raises: AAROM, Right, 5 reps, Supine (with rest breaks; gait belt PRN as leg lift assist) Long Arc Quad:  (pain too severe, pt unable to tolerate) Goniometric ROM: R knee ROM grossly 10 deg to 50 deg in supine, not much improved while seated due to pain/guarding Other Exercises Other Exercises: PTA encouraging further quad sets and heel slides/LAQ, also places towel under distal RLE to aide in promoting knee extension when resting Other Exercises: Pt assisted to don RLE thigh-high compression sock for edema mgmt and so she can tolerate iceman on her skin better for improved compliance as pt guarding significantly due to pain. RLE feels warm.    General Comments General comments (skin integrity, edema, etc.): VSS on RA per chart review      Pertinent Vitals/Pain Pain  Assessment Pain Assessment: Faces Faces Pain Scale: Hurts whole lot Pain Location: R knee with flexion ROM Pain Descriptors / Indicators: Sore, Grimacing, Guarding, Moaning Pain Intervention(s): Limited activity within patient's tolerance, Monitored during session, Premedicated before session, Repositioned, Patient requesting pain meds-RN notified, Ice applied    Home Living                          Prior Function            PT Goals (current goals can now be found in the care plan section) Acute Rehab PT Goals Patient Stated Goal: to return to independence PT Goal Formulation: With patient Time For Goal Achievement: 07/06/23 Progress towards PT goals: Progressing toward goals    Frequency    7X/week      PT Plan      Co-evaluation              AM-PAC PT "6 Clicks" Mobility   Outcome Measure  Help needed turning from your back to your side while in a flat bed without using bedrails?: A Little Help needed moving from lying on your back to sitting on the side of a flat bed without using bedrails?: A Little  Help needed moving to and from a bed to a chair (including a wheelchair)?: A Little Help needed standing up from a chair using your arms (e.g., wheelchair or bedside chair)?: A Little Help needed to walk in hospital room?: A Little Help needed climbing 3-5 steps with a railing? : A Little 6 Click Score: 18    End of Session Equipment Utilized During Treatment: Gait belt;Other (comment) (RLE TED hose) Activity Tolerance: Patient tolerated treatment well;Patient limited by pain Patient left: in chair;with call bell/phone within reach;Other (comment) (pt agreeable to use call bell prior to getting up) Nurse Communication: Mobility status;Other (comment) (pt needs RLE thigh-high TED hose order) PT Visit Diagnosis: Other abnormalities of gait and mobility (R26.89);Muscle weakness (generalized) (M62.81);Pain Pain - Right/Left: Right Pain - part of body:  Knee     Time: 1205-1249 PT Time Calculation (min) (ACUTE ONLY): 44 min  Charges:    $Gait Training: 8-22 mins $Therapeutic Exercise: 8-22 mins $Therapeutic Activity: 8-22 mins PT General Charges $$ ACUTE PT VISIT: 1 Visit                     Antavius Sperbeck P., PTA Acute Rehabilitation Services Secure Chat Preferred 9a-5:30pm Office: [phone]    Dorathy Kinsman [facility] 07/04/2023, 1:10 PM

## 2023-07-04 NOTE — Progress Notes (Signed)
 Physical Therapy Treatment Patient Details Name: Jean [name] MRN: [id number] DOB: 1966-05-07 Today's Date: 07/04/2023   History of Present Illness 57 y.o. female presents to [facility] hospital on 07/02/2023 for elective R TKA. PMH: HTN, GERD, depression, MDD, COPD, and asthma.    PT Comments  Pt received in supine, agreeable to therapy session with encouragement, pt agreeable to session if she can prepare for upcoming DC while mobilizing OOB. Pt still guarding her RLE significantly and needing reminders to notify RN if pain meds needed, RN called by PTA at pt's request at beginning of session. RW still not arrived to her room so RN/charge RN notified as per case mgmt note, RW was ordered and supposed to be delivered to her room by [employer]. Pt needing CGA to ascend/descend 7" platform step x2 reps with bil rails but will likely need +1 assist if ascending/descending steps with only one rail, pt states she will have help to get into her home upon DC. Pt continues to benefit from PT services to progress toward functional mobility goals.     If plan is discharge home, recommend the following: A little help with bathing/dressing/bathroom;Assistance with cooking/housework;Assist for transportation;Help with stairs or ramp for entrance   Can travel by private vehicle        Equipment Recommendations  BSC/3in1;Rolling walker (2 wheels)    Recommendations for Other Services       Precautions / Restrictions Precautions Precautions: Fall;Knee Precaution Booklet Issued: Yes (comment) Recall of Precautions/Restrictions: Intact Precaution/Restrictions Comments: strong emphasis on importance of knee extension at rest and knee flexion throughout the day/HEP as pt with limited R knee ROM during exercises and not tolerating bending her knee when getting up from bed/chair Restrictions Weight Bearing Restrictions Per Provider Order: Yes RLE Weight Bearing Per Provider Order: Weight bearing as tolerated      Mobility  Bed Mobility Overal bed mobility: Needs Assistance Bed Mobility: Supine to Sit     Supine to sit: Min assist     General bed mobility comments: pt utilizing LLE to assist RLE and gait belt as a leg lifter PRN but needed minA to carry RLE over EOB due to increased pain in PM session; Limited tolerance for RLE in dependent posture today due to knee pain, RN notified as pt has not had pain meds since muscle relaxer given at 1:03 pm.    Transfers Overall transfer level: Needs assistance Equipment used: Rolling walker (2 wheels) Transfers: Sit to/from Stand Sit to Stand: Supervision           General transfer comment: Cues for safety and RLE advanced prior to sitting/standing.    Ambulation/Gait Ambulation/Gait assistance: Supervision, Contact guard assist Gait Distance (Feet): 100 Feet Assistive device: Rolling walker (2 wheels) Gait Pattern/deviations: Step-to pattern, Decreased weight shift to right, Decreased step length - left, Antalgic Gait velocity: reduced Gait velocity interpretation: <1.8 ft/sec, indicate of risk for recurrent falls   General Gait Details: slowed step-through gait, reduced terminal knee extension of RLE, reduced R knee flexion during swing phase, cues for step-to vs step-through depending on pain level, pt receptive and practices both methods. slightly narrow BOS with pt cued for widening her stance when turning for safety. CGA when pt lifting RW to/from step to practice step-up onto 7" platform in her room, but otherwise she is Supervision for gait   Stairs Stairs: Yes Stairs assistance: Contact guard assist Stair Management: Step to pattern, Backwards, Forwards, With walker Number of Stairs: 2 General stair comments:  RW to simulate bil rails, no buckling, pt able to verbalize safe sequencing technique when prompted.   Wheelchair Mobility     Tilt Bed    Modified Rankin (Stroke Patients Only)       Balance Overall balance  assessment: Needs assistance Sitting-balance support: No upper extremity supported, Feet supported Sitting balance-Leahy Scale: Good     Standing balance support: Reliant on assistive device for balance, Bilateral upper extremity supported Standing balance-Leahy Scale: Poor Standing balance comment: Fair using RW, poor unsupported due to R knee pain, CGA when pt pulling up pants getting dressed for her safety due to severe RLE pain                            Communication Communication Communication: No apparent difficulties  Cognition Arousal: Alert Behavior During Therapy: WFL for tasks assessed/performed   PT - Cognitive impairments: No apparent impairments                       PT - Cognition Comments: Decreased insight into need for assist after surgery, pt states last time her sister helped her "a couple days" (in 2017 after hip surgery) but now cannot help her as her sister has cancer. Pt encouraged to reach out to friends/family for assist initially and to continue aggressive compliance with RLE HEP program to regain function/ROM. Following commands: Intact      Cueing Cueing Techniques: Verbal cues  Exercises Total Joint Exercises Ankle Circles/Pumps: AROM, Both, 10 reps, Supine Quad Sets: AROM, 10 reps, Both, AAROM, Supine (tactile cues to encourage better technique) Towel Squeeze: AROM, Both, 5 reps, Supine Heel Slides: AROM, AAROM, Right, 10 reps, Supine (cues for pausing, pursed-lip breathing, then continuing to bend more) Hip ABduction/ADduction: AAROM, Right, 5 reps, Supine (pt shown how to use gait belt PRN for improved ROM) Straight Leg Raises: AAROM, Right, 5 reps, Supine (with rest breaks; gait belt PRN as leg lift assist) Long Arc Quad:  (pain too severe, pt unable to tolerate) Goniometric ROM: R knee ROM grossly 10 deg to 50 deg in chair and with ambulation, due to pain/guarding Other Exercises Other Exercises: PTA encouraging further quad  sets and heel slides/LAQ, also places towel under distal RLE to aide in promoting knee extension when resting Other Exercises: Pt assisted to don RLE thigh-high compression sock for edema mgmt and so she can tolerate iceman on her skin better for improved compliance as pt guarding significantly due to pain. RLE feels warm.    General Comments General comments (skin integrity, edema, etc.): Pt assisted to don clothing per her request due to upcoming DC, and for re-donning RLE iceman after it was refilled by PTA      Pertinent Vitals/Pain Pain Assessment Pain Assessment: Faces Faces Pain Scale: Hurts whole lot Pain Location: R knee with flexion ROM Pain Descriptors / Indicators: Sore, Grimacing, Guarding, Moaning Pain Intervention(s): Limited activity within patient's tolerance, Monitored during session, Repositioned, Patient requesting pain meds-RN notified, Ice applied (iceman refilled and donned)    Home Living                          Prior Function            PT Goals (current goals can now be found in the care plan section) Acute Rehab PT Goals Patient Stated Goal: to return to independence PT Goal Formulation: With patient Time For  Goal Achievement: 07/06/23 Progress towards PT goals: Progressing toward goals    Frequency    7X/week      PT Plan      Co-evaluation              AM-PAC PT "6 Clicks" Mobility   Outcome Measure  Help needed turning from your back to your side while in a flat bed without using bedrails?: A Little Help needed moving from lying on your back to sitting on the side of a flat bed without using bedrails?: A Little Help needed moving to and from a bed to a chair (including a wheelchair)?: A Little Help needed standing up from a chair using your arms (e.g., wheelchair or bedside chair)?: A Little Help needed to walk in hospital room?: A Little Help needed climbing 3-5 steps with a railing? : A Little 6 Click Score: 18     End of Session Equipment Utilized During Treatment: Gait belt;Other (comment) (RLE TED hose, iceman) Activity Tolerance: Patient limited by pain;Patient tolerated treatment well Patient left: in chair;with call bell/phone within reach;Other (comment);with nursing/sitter in room (pt agreeable to use call bell prior to getting up, RN arriving and notified pt still needing her RW) Nurse Communication: Mobility status;Other (comment) (pt needs RLE thigh-high TED hose order) PT Visit Diagnosis: Other abnormalities of gait and mobility (R26.89);Muscle weakness (generalized) (M62.81);Pain Pain - Right/Left: Right Pain - part of body: Knee     Time: 1734-1759 PT Time Calculation (min) (ACUTE ONLY): 25 min  Charges:    $Gait Training: 8-22 mins $Therapeutic Activity: 8-22 mins PT General Charges $$ ACUTE PT VISIT: 1 Visit                     Glynnis Gavel P., PTA Acute Rehabilitation Services Secure Chat Preferred 9a-5:30pm Office: [phone]    [place] 07/04/2023, 6:20 PM

## 2023-07-04 NOTE — Discharge Summary (Signed)
 Patient ID: Jean [name] MRN: [id number] DOB/AGE: [birth year] 60 y.o.  Admit date: 07/02/2023 Discharge date: 07/04/2023  Admission Diagnoses:  Principal Problem:   Osteoarthritis of right knee Active Problems:   Status post total right knee replacement   Discharge Diagnoses:  Same  Past Medical History:  Diagnosis Date   Adnexal mass 2019   likely remnant fibroid on broad ligament; getting diagnostic lap   Anxiety    on meds   Arthritis    hip-uses OTC meds   Asthma    uses inhaler as needed   Carpal tunnel syndrome    bilateral   Chest wall pain 10/15/2016   Chronic lower back pain    COPD (chronic obstructive pulmonary disease) (HCC)    uses in haler as needed   Depression    on meds   Dyspnea    with exertion   Dysrhythmia    GERD (gastroesophageal reflux disease)    Headache    migraines   Herpes    Hypertension    on meds   Liver lesion, right lobe 12/01/2015   CT chest without contrast 11/30/15 with hepatic lesions measuring 2.0 x 1.7 cm of the right lobe and 1 x 1 cm on the dome. CT abd/pel 8/18: 1.3cm inferior R liver lobe, prob benign but recommend 3-46mo f/u with MRI w/wo IV contrast with attenuation MRI 6/19: combination of benign cavernous hemangiomas and small benign cysts   PONV (postoperative nausea and vomiting)    Poor dental hygiene    chipped upper front and several loose teeth   Seasonal allergies    Sickle cell trait (HCC)    Substance abuse (HCC)    cocaine/crack cocaine - last use 02/07/18   SVD (spontaneous vaginal delivery)    x 1   Tobacco abuse 07/08/2015   Uses walker    for ambulation   Uterine leiomyoma 02/16/2016   s/p vaginal hysterectomy 2018   Vaginal discharge 03/26/2018    Surgeries: Procedure(s): RIGHT TOTAL KNEE ARTHROPLASTY on 07/02/2023   Consultants:   Discharged Condition: Improved  Hospital Course: Jean [name] is an 58 y.o. female who was admitted 07/02/2023 for operative treatment ofOsteoarthritis of  right knee. Patient has severe unremitting pain that affects sleep, daily activities, and work/hobbies. After pre-op clearance the patient was taken to the operating room on 07/02/2023 and underwent  Procedure(s): RIGHT TOTAL KNEE ARTHROPLASTY.    Patient was given perioperative antibiotics:  Anti-infectives (From admission, onward)    Start     Dose/Rate Route Frequency Ordered Stop   07/02/23 1545  ceFAZolin (ANCEF) IVPB 2g/100 mL premix        2 g 200 mL/hr over 30 Minutes Intravenous Every 6 hours 07/02/23 1458 07/03/23 0722   07/02/23 0601  ceFAZolin (ANCEF) 2-4 GM/100ML-% IVPB       Note to Pharmacy: Lacie Draft A: cabinet override      07/02/23 0601 07/02/23 0746   07/02/23 0600  ceFAZolin (ANCEF) IVPB 2g/100 mL premix        2 g 200 mL/hr over 30 Minutes Intravenous On call to O.R. 07/02/23 1914 07/02/23 0744        Patient was given sequential compression devices, early ambulation, and chemoprophylaxis to prevent DVT.  Patient benefited maximally from hospital stay and there were no complications.    Recent vital signs: Patient Vitals for the past 24 hrs:  BP Temp Temp src Pulse Resp SpO2  07/04/23 1251 134/77 98 F (36.7 C) -- 70  18 94 %  07/04/23 0833 (!) 155/98 98.8 F (37.1 C) Oral 92 19 100 %  07/04/23 0513 126/83 98 F (36.7 C) Oral 87 20 100 %  07/03/23 2109 (!) 159/87 98 F (36.7 C) Oral 75 19 100 %     Recent laboratory studies:  Recent Labs    07/03/23 0606  WBC 10.3  HGB 12.3  HCT 37.5  PLT 331  NA 134*  K 4.6  CL 101  CO2 22  BUN 16  CREATININE 1.29*  GLUCOSE 125*  CALCIUM 10.2     Discharge Medications:   Allergies as of 07/04/2023       Reactions   Lisinopril Swelling, Other (See Comments)   Lip swelling, patient reported - unable to confirm from chart         Medication List     TAKE these medications    acetaminophen 500 MG tablet Commonly known as: TYLENOL Take 500 mg by mouth every 6 (six) hours as needed for  moderate pain.   albuterol 108 (90 Base) MCG/ACT inhaler Commonly known as: Ventolin HFA Inhale 2 puffs into the lungs every 4 (four) hours as needed for wheezing or shortness of breath.   amLODipine 10 MG tablet Commonly known as: NORVASC Take 1 tablet (10 mg total) by mouth daily.   aspirin 81 MG chewable tablet Chew 1 tablet (81 mg total) by mouth 2 (two) times daily. What changed: when to take this   atorvastatin 40 MG tablet Commonly known as: Lipitor Take 1 tablet (40 mg total) by mouth daily.   Breztri Aerosphere 160-9-4.8 MCG/ACT Aero Generic drug: Budeson-Glycopyrrol-Formoterol Inhale 2 puffs into the lungs in the morning and at bedtime.   budesonide-formoterol 160-4.5 MCG/ACT inhaler Commonly known as: Symbicort Inhale 2 puffs into the lungs 2 (two) times daily.   cyclobenzaprine 10 MG tablet Commonly known as: FLEXERIL Take 1 tablet (10 mg total) by mouth 3 (three) times daily as needed for muscle spasms. TAKE 1 TABLET BY MOUTH TWICE A DAY AS NEEDED FOR MUSCLE SPASMS What changed:  how much to take how to take this when to take this reasons to take this   famotidine 20 MG tablet Commonly known as: PEPCID TAKE 1 TABLET BY MOUTH TWICE A DAY   gabapentin 300 MG capsule Commonly known as: NEURONTIN TAKE 1 CAPSULE BY MOUTH EVERYDAY AT BEDTIME   metoprolol tartrate 25 MG tablet Commonly known as: LOPRESSOR Take 0.5 tablets (12.5 mg total) by mouth 2 (two) times daily.   oxyCODONE 5 MG immediate release tablet Commonly known as: Oxy IR/ROXICODONE Take 1-2 tablets (5-10 mg total) by mouth every 6 (six) hours as needed for moderate pain (pain score 4-6) (pain score 4-6).   pantoprazole 40 MG tablet Commonly known as: PROTONIX Take 1 tablet (40 mg total) by mouth daily.   sertraline 25 MG tablet Commonly known as: ZOLOFT TAKE 1 TABLET (25 MG TOTAL) BY MOUTH DAILY.   spironolactone 25 MG tablet Commonly known as: ALDACTONE Take 1 tablet (25 mg total) by  mouth daily.   tetrahydrozoline 0.05 % ophthalmic solution Place 1 drop into both eyes daily as needed (for dry eyes).               Durable Medical Equipment  (From admission, onward)           Start     Ordered   07/02/23 1528  DME 3 n 1  Once       Comments: Bedside commode,  confine to one room   07/02/23 1529   07/02/23 1459  DME Walker rolling  Once       Question Answer Comment  Walker: With 5 Inch Wheels   Patient needs a walker to treat with the following condition Status post total right knee replacement      07/02/23 1458            Diagnostic Studies: DG Knee Right Port Result Date: 07/02/2023 CLINICAL DATA:  Status post right knee arthroplasty. EXAM: PORTABLE RIGHT KNEE - 1-2 VIEW COMPARISON:  Right knee radiographs 02/27/2023 FINDINGS: Interval total right knee arthroplasty. No perihardware lucency is seen to indicate hardware failure or loosening. Expected postoperative changes including intra-articular and subcutaneous air. Mild-to-moderatejoint effusion. Anterior surgical skin staples. No acute fracture or dislocation. IMPRESSION: Interval total right knee arthroplasty without evidence of hardware failure. Electronically Signed   By: Neita Garnet M.D.   On: 07/02/2023 10:22    Disposition: Discharge disposition: 01-Home or Self Care          Follow-up Information     [facility] Follow up.   Specialty: Rehabilitation Contact information: [address] [place] [address] [phone]        Kathryne Hitch, MD Follow up in 2 week(s).   Specialty: Orthopedic Surgery Contact information: [address] [place] [address] [phone]         [facility] Follow up.   Why: home health services will be provided by [facility], start of care 07/07/2023                 Signed: Kathryne Hitch 07/04/2023, 7:10  PM

## 2023-07-04 NOTE — Progress Notes (Signed)
 Subjective: 2 Days Post-Op Procedure(s) (LRB): RIGHT TOTAL KNEE ARTHROPLASTY (Right) Patient reports pain as moderate.    Objective: Vital signs in last 24 hours: Temp:  [98 F (36.7 C)-98.8 F (37.1 C)] 98.8 F (37.1 C) (02/27 0833) Pulse Rate:  [70-92] 92 (02/27 0833) Resp:  [18-20] 19 (02/27 0833) BP: (126-159)/(83-98) 155/98 (02/27 0833) SpO2:  [100 %] 100 % (02/27 0833)  Intake/Output from previous day: 02/26 0701 - 02/27 0700 In: -  Out: 350 [Urine:350] Intake/Output this shift: No intake/output data recorded.  Recent Labs    07/03/23 0606  HGB 12.3   Recent Labs    07/03/23 0606  WBC 10.3  RBC 3.89  HCT 37.5  PLT 331   Recent Labs    07/03/23 0606  NA 134*  K 4.6  CL 101  CO2 22  BUN 16  CREATININE 1.29*  GLUCOSE 125*  CALCIUM 10.2   No results for input(s): "LABPT", "INR" in the last 72 hours.  Sensation intact distally Intact pulses distally Dorsiflexion/Plantar flexion intact Incision: scant drainage Compartment soft   Assessment/Plan: 2 Days Post-Op Procedure(s) (LRB): RIGHT TOTAL KNEE ARTHROPLASTY (Right) Up with therapy Discharge home with home health this afternoon.      Kathryne Hitch 07/04/2023, 11:08 AM

## 2023-07-04 NOTE — Progress Notes (Signed)
 Discharge instructions (including medications) discussed with and copy provided to patient/caregiver  Patient has her equipment in the room with her and her ride will be here between 6-7 pm tonight

## 2023-07-05 ENCOUNTER — Telehealth: Payer: Self-pay

## 2023-07-05 ENCOUNTER — Telehealth: Payer: Self-pay | Admitting: Orthopaedic Surgery

## 2023-07-05 NOTE — Telephone Encounter (Signed)
 LMOM for patient that we did call this in unfortunately her insurance will not cover I told her she can pay out of pocket, if not we can change

## 2023-07-05 NOTE — Telephone Encounter (Signed)
 Patient called stating that she was advised that we have to call the [facility] on [address] and let them know that she can pay for the Rx OOP. CB# [phone] .  Please advise.  Thank you

## 2023-07-05 NOTE — Telephone Encounter (Signed)
 Patient called requesting for pain medication.

## 2023-07-05 NOTE — Telephone Encounter (Signed)
 Patient aware this is ready for her Pharmacy was running this for a day day supply instead of 5, Medicaid said that is why it was being rejected?

## 2023-07-08 ENCOUNTER — Telehealth: Payer: Self-pay | Admitting: Physician Assistant

## 2023-07-08 NOTE — Telephone Encounter (Signed)
 Verbal order given

## 2023-07-08 NOTE — Telephone Encounter (Signed)
 Verbal orders  3x a week for 2 weeks   Twice a week for 2 weeks

## 2023-07-09 ENCOUNTER — Telehealth: Payer: Self-pay | Admitting: Student

## 2023-07-09 ENCOUNTER — Ambulatory Visit: Payer: Medicaid Other | Admitting: Pulmonary Disease

## 2023-07-09 NOTE — Telephone Encounter (Signed)
 Please call the patient back about getting PCS Services.

## 2023-07-10 ENCOUNTER — Ambulatory Visit: Payer: Self-pay | Admitting: Student

## 2023-07-10 NOTE — Telephone Encounter (Signed)
 Chief Complaint: knee pain Symptoms: knee pain, skin that is hot to the touch, skin discoloration, "knot' under the skin Frequency: R total knee replacement 2/25 Pertinent Negatives: Patient denies fever, CP, SOB Disposition: [x] ED /[] Urgent Care (no appt availability in office) / [] Appointment(In office/virtual)/ []  [facility]/ [] Home Care/ [] Refused Recommended Disposition /[] [facility]/ []  Follow-up with PCP Additional Notes: Pt initially calling to get set up with in home care following a R total knee replacement on 2/25. Pt crying on the phone d/t pain and limited mobility at home. Pt states pain is so severe she is unable to get ice to ice her knee. Rates pain 10/10. Pt is taking prescribed oxycodone and flexeril. Pt states the knee is bandaged but states the skin next to the bandage is hot the touch. States some skin in that area is turning purple. Pt states she has a "knot" underneath her knee that is getting bigger. Pt states the knot has been there since an ED drained her knee but that it is becoming larger. Endorses calf pain, states calf is hot to the touch as well. Per protocol and to r/u a DVT RN advised pt go to the ED. Pt agreeable, states she will have a friend take her. RN advised pt that if her friend cannot take her, or if she develops CP, SOB, or any worsening, that she needs to call 911. Pt verbalized understanding.    Copied from CRM [id number]. Topic: Clinical - Red Word Triage >> Jul 10, 2023  3:49 PM Tiffany H wrote: Patient called to request at home care. Patient had a total knee replacement on 07/02/23 and is crying on the phone. She advised that her knee and leg hurts but she hasn't received any at home care.  VDI dropped during 2 RN Triage calls. Please call patient back for wellness check. Reason for Disposition  Patient sounds very sick or weak to the triager  Answer Assessment - Initial Assessment Questions 1. LOCATION and RADIATION: "Where is  the pain located?"      R knee, total knee replacement on 2/25 2. QUALITY: "What does the pain feel like?"  (e.g., sharp, dull, aching, burning)     "I'm just hurting real bad and my ankle is swollen up", "it's been swollen since I had surgery but it went down because I used an ice pack" 3. SEVERITY: "How bad is the pain?" "What does it keep you from doing?"   (Scale 1-10; or mild, moderate, severe)   -  MILD (1-3): doesn't interfere with normal activities    -  MODERATE (4-7): interferes with normal activities (e.g., work or school) or awakens from sleep, limping    -  SEVERE (8-10): excruciating pain, unable to do any normal activities, unable to walk     10/10 4. ONSET: "When did the pain start?" "Does it come and go, or is it there all the time?"     Knee replacement 2/25 5. RECURRENT: "Have you had this pain before?" If Yes, ask: "When, and what happened then?"     Knee replacement 6. SETTING: "Has there been any recent work, exercise or other activity that involved that part of the body?"      Knee replacement 7. AGGRAVATING FACTORS: "What makes the knee pain worse?" (e.g., walking, climbing stairs, running)     "When I have to get up and get on the potty" 8. ASSOCIATED SYMPTOMS: "Is there any swelling or redness of the knee?"     "  I have a bandage on it" "I got 26 staples going down" "on the side of the bandage I can feel my knee is really, really hot but I can't even get up to get ice to fill the ice cooler"  9. OTHER SYMPTOMS: "Do you have any other symptoms?" (e.g., chest pain, difficulty breathing, fever, calf pain)     Knee hot since yesterday, no chills or fever, "my fingers are jumping, like muscle spasms make them do what they want to do" "I took my muscle spasm pill", no CP or SOB, does endorse calf pain and states it is hot to the touch, does not see redness, calf is not painful to the touch but shin is. "Looks like it's trying to turn purple" underneath knee bandage, "other side  of knee is a big knot"  Protocols used: Knee Pain-A-AH

## 2023-07-10 NOTE — Telephone Encounter (Signed)
 Called pt - stated she's waiting on someone to take her to the ER. Reiterated to call 911 if she does not have transportation - pt voiced understanding.

## 2023-07-11 ENCOUNTER — Telehealth: Payer: Self-pay | Admitting: *Deleted

## 2023-07-11 ENCOUNTER — Encounter (HOSPITAL_COMMUNITY): Payer: Self-pay

## 2023-07-11 ENCOUNTER — Ambulatory Visit: Payer: Self-pay | Admitting: Student

## 2023-07-11 ENCOUNTER — Observation Stay (HOSPITAL_COMMUNITY)

## 2023-07-11 ENCOUNTER — Other Ambulatory Visit: Payer: Self-pay

## 2023-07-11 ENCOUNTER — Observation Stay (HOSPITAL_COMMUNITY)
Admission: EM | Admit: 2023-07-11 | Discharge: 2023-07-17 | Disposition: A | Discharge status: Home Health | Attending: Internal Medicine | Admitting: Internal Medicine

## 2023-07-11 ENCOUNTER — Emergency Department (HOSPITAL_COMMUNITY)

## 2023-07-11 DIAGNOSIS — Z791 Long term (current) use of non-steroidal anti-inflammatories (NSAID): Secondary | ICD-10-CM | POA: Diagnosis not present

## 2023-07-11 DIAGNOSIS — K921 Melena: Secondary | ICD-10-CM | POA: Diagnosis not present

## 2023-07-11 DIAGNOSIS — D509 Iron deficiency anemia, unspecified: Secondary | ICD-10-CM | POA: Diagnosis not present

## 2023-07-11 DIAGNOSIS — I129 Hypertensive chronic kidney disease with stage 1 through stage 4 chronic kidney disease, or unspecified chronic kidney disease: Secondary | ICD-10-CM | POA: Insufficient documentation

## 2023-07-11 DIAGNOSIS — J449 Chronic obstructive pulmonary disease, unspecified: Secondary | ICD-10-CM | POA: Diagnosis not present

## 2023-07-11 DIAGNOSIS — R011 Cardiac murmur, unspecified: Secondary | ICD-10-CM | POA: Diagnosis not present

## 2023-07-11 DIAGNOSIS — K648 Other hemorrhoids: Secondary | ICD-10-CM | POA: Diagnosis not present

## 2023-07-11 DIAGNOSIS — M2508 Hemarthrosis, other specified site: Secondary | ICD-10-CM | POA: Insufficient documentation

## 2023-07-11 DIAGNOSIS — Z96642 Presence of left artificial hip joint: Secondary | ICD-10-CM | POA: Insufficient documentation

## 2023-07-11 DIAGNOSIS — K3189 Other diseases of stomach and duodenum: Secondary | ICD-10-CM | POA: Diagnosis not present

## 2023-07-11 DIAGNOSIS — Z8673 Personal history of transient ischemic attack (TIA), and cerebral infarction without residual deficits: Secondary | ICD-10-CM | POA: Diagnosis not present

## 2023-07-11 DIAGNOSIS — Z79899 Other long term (current) drug therapy: Secondary | ICD-10-CM | POA: Insufficient documentation

## 2023-07-11 DIAGNOSIS — K2970 Gastritis, unspecified, without bleeding: Secondary | ICD-10-CM | POA: Insufficient documentation

## 2023-07-11 DIAGNOSIS — M2500 Hemarthrosis, unspecified joint: Secondary | ICD-10-CM | POA: Insufficient documentation

## 2023-07-11 DIAGNOSIS — Z7982 Long term (current) use of aspirin: Secondary | ICD-10-CM | POA: Diagnosis not present

## 2023-07-11 DIAGNOSIS — N1831 Chronic kidney disease, stage 3a: Secondary | ICD-10-CM | POA: Insufficient documentation

## 2023-07-11 DIAGNOSIS — K449 Diaphragmatic hernia without obstruction or gangrene: Secondary | ICD-10-CM | POA: Insufficient documentation

## 2023-07-11 DIAGNOSIS — R195 Other fecal abnormalities: Secondary | ICD-10-CM | POA: Diagnosis not present

## 2023-07-11 DIAGNOSIS — Z87891 Personal history of nicotine dependence: Secondary | ICD-10-CM | POA: Insufficient documentation

## 2023-07-11 DIAGNOSIS — M25561 Pain in right knee: Secondary | ICD-10-CM

## 2023-07-11 DIAGNOSIS — Z96651 Presence of right artificial knee joint: Secondary | ICD-10-CM | POA: Diagnosis not present

## 2023-07-11 DIAGNOSIS — D649 Anemia, unspecified: Principal | ICD-10-CM | POA: Diagnosis present

## 2023-07-11 LAB — BASIC METABOLIC PANEL
Anion gap: 14 (ref 5–15)
BUN: 18 mg/dL (ref 6–20)
CO2: 20 mmol/L — ABNORMAL LOW (ref 22–32)
Calcium: 9.7 mg/dL (ref 8.9–10.3)
Chloride: 102 mmol/L (ref 98–111)
Creatinine, Ser: 1.23 mg/dL — ABNORMAL HIGH (ref 0.44–1.00)
GFR, Estimated: 50 mL/min — ABNORMAL LOW (ref >60)
Glucose, Bld: 96 mg/dL (ref 70–99)
Potassium: 3.8 mmol/L (ref 3.5–5.1)
Sodium: 136 mmol/L (ref 135–145)

## 2023-07-11 LAB — CBC
HCT: 29.3 % — ABNORMAL LOW (ref 36.0–46.0)
Hemoglobin: 9.3 g/dL — ABNORMAL LOW (ref 12.0–15.0)
MCH: 31.2 pg (ref 26.0–34.0)
MCHC: 31.7 g/dL (ref 30.0–36.0)
MCV: 98.3 fL (ref 80.0–100.0)
Platelets: 598 10*3/uL — ABNORMAL HIGH (ref 150–400)
RBC: 2.98 MIL/uL — ABNORMAL LOW (ref 3.87–5.11)
RDW: 13 % (ref 11.5–15.5)
WBC: 8.6 10*3/uL (ref 4.0–10.5)
nRBC: 0 % (ref 0.0–0.2)

## 2023-07-11 LAB — CBC WITH DIFFERENTIAL/PLATELET
Abs Immature Granulocytes: 0.07 10*3/uL (ref 0.00–0.07)
Basophils Absolute: 0.1 10*3/uL (ref 0.0–0.1)
Basophils Relative: 1 %
Eosinophils Absolute: 0.5 10*3/uL (ref 0.0–0.5)
Eosinophils Relative: 5 %
HCT: 29.8 % — ABNORMAL LOW (ref 36.0–46.0)
Hemoglobin: 9.5 g/dL — ABNORMAL LOW (ref 12.0–15.0)
Immature Granulocytes: 1 %
Lymphocytes Relative: 27 %
Lymphs Abs: 2.6 10*3/uL (ref 0.7–4.0)
MCH: 31.3 pg (ref 26.0–34.0)
MCHC: 31.9 g/dL (ref 30.0–36.0)
MCV: 98 fL (ref 80.0–100.0)
Monocytes Absolute: 0.6 10*3/uL (ref 0.1–1.0)
Monocytes Relative: 6 %
Neutro Abs: 5.8 10*3/uL (ref 1.7–7.7)
Neutrophils Relative %: 60 %
Platelets: 627 10*3/uL — ABNORMAL HIGH (ref 150–400)
RBC: 3.04 MIL/uL — ABNORMAL LOW (ref 3.87–5.11)
RDW: 13.1 % (ref 11.5–15.5)
WBC: 9.7 10*3/uL (ref 4.0–10.5)
nRBC: 0 % (ref 0.0–0.2)

## 2023-07-11 LAB — ECHOCARDIOGRAM COMPLETE
AR max vel: 2.14 cm2
AV Area VTI: 2.06 cm2
AV Area mean vel: 2.06 cm2
AV Mean grad: 6 mmHg
AV Peak grad: 12 mmHg
Ao pk vel: 1.73 m/s
Area-P 1/2: 4.21 cm2
Height: 64 in
S' Lateral: 2.6 cm
Weight: 2176 [oz_av]

## 2023-07-11 LAB — IRON AND TIBC
Iron: 27 ug/dL — ABNORMAL LOW (ref 28–170)
Saturation Ratios: 9 % — ABNORMAL LOW (ref 10.4–31.8)
TIBC: 305 ug/dL (ref 250–450)
UIBC: 278 ug/dL

## 2023-07-11 LAB — FOLATE: Folate: 18.1 ng/mL (ref >5.9)

## 2023-07-11 LAB — HIV ANTIBODY (ROUTINE TESTING W REFLEX): HIV Screen 4th Generation wRfx: NONREACTIVE

## 2023-07-11 LAB — TYPE AND SCREEN
ABO/RH(D): O POS
Antibody Screen: NEGATIVE

## 2023-07-11 LAB — VITAMIN B12: Vitamin B-12: 584 pg/mL (ref 180–914)

## 2023-07-11 LAB — RETICULOCYTES
Immature Retic Fract: 7.6 % (ref 2.3–15.9)
RBC.: 2.96 MIL/uL — ABNORMAL LOW (ref 3.87–5.11)
Retic Count, Absolute: 40.3 10*3/uL (ref 19.0–186.0)
Retic Ct Pct: 1.4 % (ref 0.4–3.1)

## 2023-07-11 LAB — CREATININE, SERUM
Creatinine, Ser: 1.12 mg/dL — ABNORMAL HIGH (ref 0.44–1.00)
GFR, Estimated: 56 mL/min — ABNORMAL LOW (ref >60)

## 2023-07-11 LAB — TECHNOLOGIST SMEAR REVIEW: Plt Morphology: NORMAL

## 2023-07-11 LAB — FERRITIN: Ferritin: 371 ng/mL — ABNORMAL HIGH (ref 11–307)

## 2023-07-11 LAB — POC OCCULT BLOOD, ED: Fecal Occult Bld: POSITIVE — AB

## 2023-07-11 MED ORDER — HYDROMORPHONE HCL 1 MG/ML IJ SOLN
1.0000 mg | Freq: Once | INTRAMUSCULAR | Status: AC
  Administered 2023-07-11: 1 mg via INTRAVENOUS
  Filled 2023-07-11: qty 1

## 2023-07-11 MED ORDER — OXYCODONE HCL 5 MG PO TABS
5.0000 mg | ORAL_TABLET | Freq: Four times a day (QID) | ORAL | Status: DC | PRN
  Administered 2023-07-11 – 2023-07-13 (×5): 5 mg via ORAL
  Filled 2023-07-11 (×5): qty 1

## 2023-07-11 MED ORDER — SODIUM CHLORIDE 0.9% FLUSH: 3.0000 mL | INTRAVENOUS | Status: DC | PRN

## 2023-07-11 MED ORDER — NA SULFATE-K SULFATE-MG SULF 17.5-3.13-1.6 GM/177ML PO SOLN
0.5000 | Freq: Once | ORAL | Status: AC
  Administered 2023-07-11: 177 mL via ORAL
  Filled 2023-07-11 (×2): qty 1

## 2023-07-11 MED ORDER — NA SULFATE-K SULFATE-MG SULF 17.5-3.13-1.6 GM/177ML PO SOLN
0.5000 | Freq: Once | ORAL | Status: AC
  Administered 2023-07-12: 177 mL via ORAL
  Filled 2023-07-11: qty 1

## 2023-07-11 MED ORDER — ACETAMINOPHEN 325 MG PO TABS
650.0000 mg | ORAL_TABLET | Freq: Four times a day (QID) | ORAL | Status: DC | PRN
  Administered 2023-07-13 – 2023-07-16 (×5): 650 mg via ORAL
  Filled 2023-07-11 (×6): qty 2

## 2023-07-11 MED ORDER — SODIUM CHLORIDE 0.9 % IV SOLN: INTRAVENOUS | Status: DC

## 2023-07-11 MED ORDER — BISACODYL 5 MG PO TBEC
10.0000 mg | DELAYED_RELEASE_TABLET | Freq: Once | ORAL | Status: AC
  Administered 2023-07-11: 10 mg via ORAL
  Filled 2023-07-11: qty 2

## 2023-07-11 MED ORDER — GABAPENTIN 300 MG PO CAPS
300.0000 mg | ORAL_CAPSULE | Freq: Every day | ORAL | Status: DC
  Administered 2023-07-11 – 2023-07-16 (×6): 300 mg via ORAL
  Filled 2023-07-11 (×6): qty 1

## 2023-07-11 MED ORDER — PANTOPRAZOLE SODIUM 40 MG IV SOLR
40.0000 mg | Freq: Two times a day (BID) | INTRAVENOUS | Status: DC
  Administered 2023-07-11 – 2023-07-12 (×3): 40 mg via INTRAVENOUS
  Filled 2023-07-11 (×3): qty 10

## 2023-07-11 MED ORDER — POLYETHYLENE GLYCOL 3350 17 G PO PACK: 17.0000 g | PACK | Freq: Every day | ORAL | Status: DC | PRN

## 2023-07-11 MED ORDER — SODIUM CHLORIDE 0.9% FLUSH
3.0000 mL | Freq: Two times a day (BID) | INTRAVENOUS | Status: DC
Start: 2023-07-11 — End: 2023-07-17
  Administered 2023-07-11 (×2): 3 mL via INTRAVENOUS
  Administered 2023-07-12: 10 mL via INTRAVENOUS
  Administered 2023-07-12: 3 mL via INTRAVENOUS
  Administered 2023-07-13 – 2023-07-15 (×5): 10 mL via INTRAVENOUS
  Administered 2023-07-16: 3 mL via INTRAVENOUS
  Administered 2023-07-16 – 2023-07-17 (×2): 10 mL via INTRAVENOUS

## 2023-07-11 MED ORDER — OXYCODONE HCL 5 MG PO TABS
5.0000 mg | ORAL_TABLET | Freq: Once | ORAL | Status: AC
  Administered 2023-07-11: 5 mg via ORAL
  Filled 2023-07-11: qty 1

## 2023-07-11 MED ORDER — OXYCODONE HCL 5 MG PO TABS
5.0000 mg | ORAL_TABLET | ORAL | Status: DC | PRN
  Administered 2023-07-11: 5 mg via ORAL
  Filled 2023-07-11: qty 1

## 2023-07-11 MED ORDER — ALBUTEROL SULFATE (2.5 MG/3ML) 0.083% IN NEBU: 3.0000 mL | INHALATION_SOLUTION | RESPIRATORY_TRACT | Status: DC | PRN

## 2023-07-11 MED ORDER — HEPARIN SODIUM (PORCINE) 5000 UNIT/ML IJ SOLN: 5000.0000 [IU] | Freq: Three times a day (TID) | INTRAMUSCULAR | Status: DC

## 2023-07-11 MED ORDER — SERTRALINE HCL 50 MG PO TABS
25.0000 mg | ORAL_TABLET | Freq: Every day | ORAL | Status: DC
  Administered 2023-07-11 – 2023-07-17 (×6): 25 mg via ORAL
  Filled 2023-07-11 (×6): qty 1

## 2023-07-11 MED ORDER — ATORVASTATIN CALCIUM 40 MG PO TABS
40.0000 mg | ORAL_TABLET | Freq: Every day | ORAL | Status: DC
  Administered 2023-07-11 – 2023-07-17 (×6): 40 mg via ORAL
  Filled 2023-07-11 (×6): qty 1

## 2023-07-11 MED ORDER — CYCLOBENZAPRINE HCL 10 MG PO TABS
10.0000 mg | ORAL_TABLET | Freq: Three times a day (TID) | ORAL | Status: DC | PRN
  Administered 2023-07-11 – 2023-07-16 (×12): 10 mg via ORAL
  Filled 2023-07-11 (×12): qty 1

## 2023-07-11 MED ORDER — PANTOPRAZOLE SODIUM 40 MG IV SOLR
40.0000 mg | Freq: Once | INTRAVENOUS | Status: AC
  Administered 2023-07-11: 40 mg via INTRAVENOUS
  Filled 2023-07-11: qty 10

## 2023-07-11 MED ORDER — ACETAMINOPHEN 650 MG RE SUPP: 650.0000 mg | Freq: Four times a day (QID) | RECTAL | Status: DC | PRN

## 2023-07-11 NOTE — H&P (View-Only) (Signed)
 Consultation  Referring Provider:  [facility]  Primary Care Physician:  [name], DO Primary Gastroenterologist:  Dr. Christella Hartigan       Reason for Consultation: Concern for upper GI bleed     LOS: 0 days          HPI:   Jean [name] is a 58 y.o. female with past medical history significant for depression, chronic pain syndrome, CKD, hypertension, CVA, presents for evaluation of concern for upper GI bleed.  Patient presents via EMS to the ED with knee pain.  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  Upon arrival patient was found to have acute anemia with Hgb of 9.5 (down from 12.3 8 days ago).  BUN 18, CR 1.23, GFR 15.  Hemoccult positive.  She has been taking aspirin daily for her history of CVA and has been taking ibuprofen due to her recent total knee replacement.  Anemia panel currently pending.  Patient states she is unsure if she has had any melena or blood in stool as she does not look at her stool.  She has adequate bowel movements.  Denies abdominal pain, nausea, vomiting.  States she occasionally has heartburn.  She states she has not been taking much ibuprofen but has been taking "some" since her total knee replacement.  She was scheduled to have colonoscopy in 2023 but she states she was in the waiting room for 2 hours and decided to leave before this was completed.  Denies family history of colon cancer.  She does note nonradiating chest pain that is intermittent with no identifiable trigger.  Not worse with exertion.  Not worse with eating.  This has been going on for a while for her.  She has not tried any medication for this.  Past Medical History:  Diagnosis Date   Adnexal mass 2019   likely remnant fibroid on broad ligament; getting diagnostic lap   Anxiety    on meds   Arthritis    hip-uses OTC meds   Asthma    uses inhaler as needed   Carpal tunnel syndrome    bilateral   Chest wall pain 10/15/2016   Chronic lower back  pain    COPD (chronic obstructive pulmonary disease) (HCC)    uses in haler as needed   Depression    on meds   Dyspnea    with exertion   Dysrhythmia    GERD (gastroesophageal reflux disease)    Headache    migraines   Herpes    Hypertension    on meds   Liver lesion, right lobe 12/01/2015   CT chest without contrast 11/30/15 with hepatic lesions measuring 2.0 x 1.7 cm of the right lobe and 1 x 1 cm on the dome. CT abd/pel 8/18: 1.3cm inferior R liver lobe, prob benign but recommend 3-5mo f/u with MRI w/wo IV contrast with attenuation MRI 6/19: combination of benign cavernous hemangiomas and small benign cysts   PONV (postoperative nausea and vomiting)    Poor dental hygiene    chipped upper front and several loose teeth   Seasonal allergies    Sickle cell trait (HCC)    Substance abuse (HCC)    cocaine/crack cocaine - last use 02/07/18   SVD (spontaneous vaginal delivery)    x 1   Tobacco abuse 07/08/2015   Uses walker    for ambulation   Uterine leiomyoma 02/16/2016   s/p vaginal hysterectomy 2018   Vaginal discharge 03/26/2018  Surgical History:  She  has a past surgical history that includes Total hip arthroplasty (Left, 01/20/2016); Salpingectomy (Left); Vaginal hysterectomy (Right, 12/11/2016); Tubal ligation; Laparoscopic ovarian cystectomy (Right, 02/25/2018); and Total knee arthroplasty (Right, 07/02/2023). Family History:  Her family history includes Breast cancer in her cousin and mother; Cancer in her mother; Diabetes in her mother; Hypertension in her mother; Stroke in her father. Social History:   reports that she has quit smoking. Her smoking use included cigarettes. She started smoking about 40 years ago. She has a 17.7 pack-year smoking history. She has never used smokeless tobacco. She reports current alcohol use of about 3.0 standard drinks of alcohol per week. She reports that she does not use drugs.  Prior to Admission medications   Medication Sig Start Date  End Date Taking? Authorizing Provider  acetaminophen (TYLENOL) 500 MG tablet Take 1,000 mg by mouth every 6 (six) hours as needed for moderate pain (pain score 4-6).   Yes [provider]  albuterol (VENTOLIN HFA) 108 (90 Base) MCG/ACT inhaler Inhale 2 puffs into the lungs every 4 (four) hours as needed for wheezing or shortness of breath. 04/23/23  Yes Hunsucker, Lesia Sago, MD  aspirin 81 MG chewable tablet Chew 1 tablet (81 mg total) by mouth 2 (two) times daily. 07/04/23  Yes Kathryne Hitch, MD  cyclobenzaprine (FLEXERIL) 10 MG tablet Take 1 tablet (10 mg total) by mouth 3 (three) times daily as needed for muscle spasms. TAKE 1 TABLET BY MOUTH TWICE A DAY AS NEEDED FOR MUSCLE SPASMS Patient taking differently: Take 10 mg by mouth 3 (three) times daily as needed for muscle spasms. 07/04/23  Yes Kathryne Hitch, MD  gabapentin (NEURONTIN) 300 MG capsule TAKE 1 CAPSULE BY MOUTH EVERYDAY AT BEDTIME Patient taking differently: Take 300 mg by mouth at bedtime. 04/23/23  Yes Kathryne Hitch, MD  ibuprofen (ADVIL) 200 MG tablet Take 600 mg by mouth every 6 (six) hours as needed for mild pain (pain score 1-3) or moderate pain (pain score 4-6).   Yes [provider]  oxyCODONE (OXY IR/ROXICODONE) 5 MG immediate release tablet Take 1-2 tablets (5-10 mg total) by mouth every 6 (six) hours as needed for moderate pain (pain score 4-6) (pain score 4-6). 07/04/23  Yes Kathryne Hitch, MD  sertraline (ZOLOFT) 25 MG tablet TAKE 1 TABLET (25 MG TOTAL) BY MOUTH DAILY. 07/03/23 12/30/23 Yes [name], DO  spironolactone (ALDACTONE) 25 MG tablet Take 1 tablet (25 mg total) by mouth daily. 07/18/22  Yes Nahser, Deloris Ping, MD  amLODipine (NORVASC) 10 MG tablet Take 1 tablet (10 mg total) by mouth daily. Patient not taking: Reported on 06/25/2023 11/21/22 04/23/23  Sharlene Dory, PA-C  atorvastatin (LIPITOR) 40 MG tablet Take 1 tablet (40 mg total) by mouth  daily. Patient not taking: Reported on 07/11/2023 11/21/22 07/02/23  Sharlene Dory, PA-C  budesonide-formoterol [facility]) 160-4.5 MCG/ACT inhaler Inhale 2 puffs into the lungs 2 (two) times daily. Patient not taking: Reported on 07/11/2023 04/23/23   Hunsucker, Lesia Sago, MD  metoprolol tartrate (LOPRESSOR) 25 MG tablet Take 0.5 tablets (12.5 mg total) by mouth 2 (two) times daily. 11/21/22 07/02/23  Sharlene Dory, PA-C  pantoprazole (PROTONIX) 40 MG tablet Take 1 tablet (40 mg total) by mouth daily. Patient not taking: Reported on 07/11/2023 12/11/22   [name], DO    Current Facility-Administered Medications  Medication Dose Route Frequency Provider Last Rate Last Admin   0.9 %  sodium chloride infusion  500 mL Intravenous Once Rachael Fee, MD       Current Outpatient Medications  Medication Sig Dispense Refill   acetaminophen (TYLENOL) 500 MG tablet Take 1,000 mg by mouth every 6 (six) hours as needed for moderate pain (pain score 4-6).     albuterol (VENTOLIN HFA) 108 (90 Base) MCG/ACT inhaler Inhale 2 puffs into the lungs every 4 (four) hours as needed for wheezing or shortness of breath. 18 each 1   aspirin 81 MG chewable tablet Chew 1 tablet (81 mg total) by mouth 2 (two) times daily. 30 tablet 0   cyclobenzaprine (FLEXERIL) 10 MG tablet Take 1 tablet (10 mg total) by mouth 3 (three) times daily as needed for muscle spasms. TAKE 1 TABLET BY MOUTH TWICE A DAY AS NEEDED FOR MUSCLE SPASMS (Patient taking differently: Take 10 mg by mouth 3 (three) times daily as needed for muscle spasms.) 30 tablet 3   gabapentin (NEURONTIN) 300 MG capsule TAKE 1 CAPSULE BY MOUTH EVERYDAY AT BEDTIME (Patient taking differently: Take 300 mg by mouth at bedtime.) 90 capsule 1   ibuprofen (ADVIL) 200 MG tablet Take 600 mg by mouth every 6 (six) hours as needed for mild pain (pain score 1-3) or moderate pain (pain score 4-6).     oxyCODONE (OXY IR/ROXICODONE) 5 MG immediate release tablet Take 1-2 tablets  (5-10 mg total) by mouth every 6 (six) hours as needed for moderate pain (pain score 4-6) (pain score 4-6). 30 tablet 0   sertraline (ZOLOFT) 25 MG tablet TAKE 1 TABLET (25 MG TOTAL) BY MOUTH DAILY. 90 tablet 2   spironolactone (ALDACTONE) 25 MG tablet Take 1 tablet (25 mg total) by mouth daily. 90 tablet 3   amLODipine (NORVASC) 10 MG tablet Take 1 tablet (10 mg total) by mouth daily. (Patient not taking: Reported on 06/25/2023) 90 tablet 3   atorvastatin (LIPITOR) 40 MG tablet Take 1 tablet (40 mg total) by mouth daily. (Patient not taking: Reported on 07/11/2023) 90 tablet 3   budesonide-formoterol (SYMBICORT) 160-4.5 MCG/ACT inhaler Inhale 2 puffs into the lungs 2 (two) times daily. (Patient not taking: Reported on 07/11/2023) 1 each 12   metoprolol tartrate (LOPRESSOR) 25 MG tablet Take 0.5 tablets (12.5 mg total) by mouth 2 (two) times daily. 90 tablet 2   pantoprazole (PROTONIX) 40 MG tablet Take 1 tablet (40 mg total) by mouth daily. (Patient not taking: Reported on 07/11/2023) 90 tablet 1    Allergies as of 07/11/2023 - Review Complete 07/11/2023  Allergen Reaction Noted   Lisinopril Swelling and Other (See Comments) 06/14/2017    Review of Systems  Constitutional:  Negative for chills, fever and weight loss.  HENT:  Negative for hearing loss and tinnitus.   Eyes:  Negative for blurred vision and double vision.  Respiratory: Negative.  Negative for cough and hemoptysis.   Cardiovascular:  Negative for chest pain and palpitations.  Gastrointestinal:  Positive for blood in stool. Negative for abdominal pain, constipation, diarrhea, heartburn, nausea and vomiting.  Genitourinary:  Negative for dysuria and urgency.  Musculoskeletal:  Negative for myalgias and neck pain.  Skin:  Negative for itching and rash.  Neurological:  Negative for seizures and loss of consciousness.  Psychiatric/Behavioral:  Negative for depression and suicidal ideas.        Physical Exam:  Vital signs in last 24  hours: Temp:  [98.4 F (36.9 C)] 98.4 F (36.9 C) (03/06 0747) Pulse Rate:  [67-90] 67 (03/06 0945) Resp:  [15-25] 16 (03/06 0945)  BP: (132-173)/(93-114) 154/100 (03/06 0945) SpO2:  [96 %-99 %] 98 % (03/06 0945) Weight:  [61.7 kg] 61.7 kg (03/06 0747)   Last BM recorded by nurses in past 5 days No data recorded  Physical Exam Constitutional:      Appearance: Normal appearance. She is not ill-appearing.  HENT:     Head: Normocephalic and atraumatic.     Nose: Nose normal. No congestion.     Mouth/Throat:     Mouth: Mucous membranes are moist.     Pharynx: Oropharynx is clear.  Eyes:     General: No scleral icterus. Cardiovascular:     Rate and Rhythm: Normal rate and regular rhythm.  Pulmonary:     Effort: Pulmonary effort is normal. No respiratory distress.  Abdominal:     General: Abdomen is flat. Bowel sounds are normal. There is no distension.     Palpations: Abdomen is soft. There is no mass.     Tenderness: There is no abdominal tenderness. There is no rebound.     Hernia: No hernia is present.  Musculoskeletal:        General: Normal range of motion.     Cervical back: Normal range of motion and neck supple.  Skin:    General: Skin is warm and dry.  Neurological:     General: No focal deficit present.     Mental Status: She is oriented to person, place, and time.  Psychiatric:        Mood and Affect: Mood normal.        Behavior: Behavior normal.        Thought Content: Thought content normal.        Judgment: Judgment normal.      LAB RESULTS: Recent Labs    07/11/23 0813  WBC 9.7  HGB 9.5*  HCT 29.8*  PLT 627*   BMET Recent Labs    07/11/23 0813  NA 136  K 3.8  CL 102  CO2 20*  GLUCOSE 96  BUN 18  CREATININE 1.23*  CALCIUM 9.7   LFT No results for input(s): "PROT", "ALBUMIN", "AST", "ALT", "ALKPHOS", "BILITOT", "BILIDIR", "IBILI" in the last 72 hours. PT/INR No results for input(s): "LABPROT", "INR" in the last 72 hours.  STUDIES: DG  Knee Complete 4 Views Right Result Date: 07/11/2023 CLINICAL DATA:  Recent surgery.  Worsening right knee pain. EXAM: RIGHT KNEE - COMPLETE 4+ VIEW COMPARISON:  Right knee radiographs 07/02/2023, 02/27/2023 FINDINGS: Redemonstration of total right knee arthroplasty. There again anterior surgical skin staples. No perihardware lucency is seen to indicate hardware failure or loosening. There is an increase in now moderate joint effusion. Interval decrease in persistent bubbles of intra-articular air. Resolution of the prior anterior distal thigh subcutaneous air. No acute fracture or dislocation. IMPRESSION: 1. Total right knee arthroplasty without evidence of hardware failure. 2. Interval increase in now moderate joint effusion. Interval decrease in intra-articular air. This may represent resolving postsurgical intra-articular air. It is difficult to exclude infection of the joint fluid. Recommend clinical correlation. Electronically Signed   By: Neita Garnet M.D.   On: 07/11/2023 08:34      Impression    Acute anemia Hgb 9.5 (down from 12.3 2/26) Platelets 627, likely reactive Hemoccult positive BUN 18, CR 1.23 Iron studies pending Recent knee replacement with history of daily 81 Mg aspirin and recent increase in ibuprofen (reportedly 600 Mg twice daily) use secondary to knee replacement.  Positive Hemoccult but patient does not check her stool so she  is unsure if she is having overt bleeding.  Longstanding history of GERD.  No previous EGD/colonoscopy.  DDx includes esophagitis, gastritis, PUD.  Right knee pain S/p total knee replacement 02/25  Asthma COPD   Plan   Plan for EGD/Colonoscopy tomorrow. I thoroughly discussed the procedures to include nature, alternatives, benefits, and risks including but not limited to bleeding, perforation, infection, anesthesia/cardiac and pulmonary complications. Patient provides understanding and gave verbal consent to proceed. Continue Protonix 40 mg IV  BID. Suprep, clear liquid diet, NPO at midnight. Continue daily CBC with transfusion as needed to maintain Hgb >7.    Thank you for your kind consultation, we will continue to follow.   Bayley Leanna Sato  07/11/2023, 10:31 AM    Attending physician's note  I have taken a history, reviewed the chart and examined the patient. I performed a substantive portion of this encounter, including complete performance of at least one of the key components, in conjunction with the APP. I agree with the APP's note, impression and recommendations.   58 year old female with history of CKD admitted with symptomatic acute anemia, heme positive stool in the setting of NSAID use Patient is poor historian, not sure if she had any melena or blood in stool Never had colonoscopy for colorectal cancer screening  Will plan to proceed with EGD and colonoscopy for further evaluation tomorrow Clear liquid diet Bowel prep Pantoprazole IV twice daily Avoid NSAIDs  Monitor hemoglobin and transfuse if below 7   The patient was provided an opportunity to ask questions and all were answered. The patient agreed with the plan and demonstrated an understanding of the instructions.  Iona Beard , MD [phone]

## 2023-07-11 NOTE — Progress Notes (Signed)
 Patient ID: NATANYA HOLECEK, female   DOB: 24-Nov-1965, 58 y.o.   MRN: [id number] I did come by the emergency room to check on the patient.  We replaced Ms. Ritzel's right knee on 07/02/2023.  She came into the emergency room today with increased knee pain and is being admitted secondary to symptomatic anemia.  Currently her hemoglobin is 9.3.  Postoperatively her hemoglobin was 12 last week.  She says her knee has been doing well.  She denies any fever or chills.  Her peripheral white blood cell count today is normal.  I did examine her right knee.  The incision with staples are intact.  There is no redness.  She is flexing and extending the knee.  There is a large effusion which is usually seen after knee replacement surgery.  I did clean the knee with Betadine swabs and aspirated at least 40 cc of dark blood from the knee consistent with hematoma and consistent with her surgery.  There is no evidence of infection or evidence of a septic joint.  I did place a new dressing.  From an orthopedic standpoint, she can still work on range of motion of her right knee as well as weightbearing as tolerated.  I am fine with her not being on any blood thinners at this standpoint given her anemia.  I will leave that up to the primary team.  I will keep her on the list and follow her along.

## 2023-07-11 NOTE — Telephone Encounter (Signed)
 1st attempt, called and LVM with call back number for patient to return call to speak with triage RN.  Copied from CRM [id number]. Topic: Clinical - Medical Advice >> Jul 11, 2023  9:22 AM Hector Shade B wrote: Reason for CRM: Patient has requested a nurse from the clinic to call her back she wanted to advise that she is about to be discharged.

## 2023-07-11 NOTE — Telephone Encounter (Signed)
 Message from patient through CMR that she is in the ER.

## 2023-07-11 NOTE — Consult Note (Addendum)
 Consultation  Referring Provider:  [facility]  Primary Care Physician:  [name], DO Primary Gastroenterologist:  Dr. Christella Hartigan       Reason for Consultation: Concern for upper GI bleed     LOS: 0 days          HPI:   Jean [name] is a 58 y.o. female with past medical history significant for depression, chronic pain syndrome, CKD, hypertension, CVA, presents for evaluation of concern for upper GI bleed.  Patient presents via EMS to the ED with knee pain.  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  Upon arrival patient was found to have acute anemia with Hgb of 9.5 (down from 12.3 8 days ago).  BUN 18, CR 1.23, GFR 15.  Hemoccult positive.  She has been taking aspirin daily for her history of CVA and has been taking ibuprofen due to her recent total knee replacement.  Anemia panel currently pending.  Patient states she is unsure if she has had any melena or blood in stool as she does not look at her stool.  She has adequate bowel movements.  Denies abdominal pain, nausea, vomiting.  States she occasionally has heartburn.  She states she has not been taking much ibuprofen but has been taking "some" since her total knee replacement.  She was scheduled to have colonoscopy in 2023 but she states she was in the waiting room for 2 hours and decided to leave before this was completed.  Denies family history of colon cancer.  She does note nonradiating chest pain that is intermittent with no identifiable trigger.  Not worse with exertion.  Not worse with eating.  This has been going on for a while for her.  She has not tried any medication for this.  Past Medical History:  Diagnosis Date   Adnexal mass 2019   likely remnant fibroid on broad ligament; getting diagnostic lap   Anxiety    on meds   Arthritis    hip-uses OTC meds   Asthma    uses inhaler as needed   Carpal tunnel syndrome    bilateral   Chest wall pain 10/15/2016   Chronic lower back  pain    COPD (chronic obstructive pulmonary disease) (HCC)    uses in haler as needed   Depression    on meds   Dyspnea    with exertion   Dysrhythmia    GERD (gastroesophageal reflux disease)    Headache    migraines   Herpes    Hypertension    on meds   Liver lesion, right lobe 12/01/2015   CT chest without contrast 11/30/15 with hepatic lesions measuring 2.0 x 1.7 cm of the right lobe and 1 x 1 cm on the dome. CT abd/pel 8/18: 1.3cm inferior R liver lobe, prob benign but recommend 3-5mo f/u with MRI w/wo IV contrast with attenuation MRI 6/19: combination of benign cavernous hemangiomas and small benign cysts   PONV (postoperative nausea and vomiting)    Poor dental hygiene    chipped upper front and several loose teeth   Seasonal allergies    Sickle cell trait (HCC)    Substance abuse (HCC)    cocaine/crack cocaine - last use 02/07/18   SVD (spontaneous vaginal delivery)    x 1   Tobacco abuse 07/08/2015   Uses walker    for ambulation   Uterine leiomyoma 02/16/2016   s/p vaginal hysterectomy 2018   Vaginal discharge 03/26/2018  Surgical History:  She  has a past surgical history that includes Total hip arthroplasty (Left, 01/20/2016); Salpingectomy (Left); Vaginal hysterectomy (Right, 12/11/2016); Tubal ligation; Laparoscopic ovarian cystectomy (Right, 02/25/2018); and Total knee arthroplasty (Right, 07/02/2023). Family History:  Her family history includes Breast cancer in her cousin and mother; Cancer in her mother; Diabetes in her mother; Hypertension in her mother; Stroke in her father. Social History:   reports that she has quit smoking. Her smoking use included cigarettes. She started smoking about 40 years ago. She has a 17.7 pack-year smoking history. She has never used smokeless tobacco. She reports current alcohol use of about 3.0 standard drinks of alcohol per week. She reports that she does not use drugs.  Prior to Admission medications   Medication Sig Start Date  End Date Taking? Authorizing Provider  acetaminophen (TYLENOL) 500 MG tablet Take 1,000 mg by mouth every 6 (six) hours as needed for moderate pain (pain score 4-6).   Yes [provider]  albuterol (VENTOLIN HFA) 108 (90 Base) MCG/ACT inhaler Inhale 2 puffs into the lungs every 4 (four) hours as needed for wheezing or shortness of breath. 04/23/23  Yes Hunsucker, Lesia Sago, MD  aspirin 81 MG chewable tablet Chew 1 tablet (81 mg total) by mouth 2 (two) times daily. 07/04/23  Yes Kathryne Hitch, MD  cyclobenzaprine (FLEXERIL) 10 MG tablet Take 1 tablet (10 mg total) by mouth 3 (three) times daily as needed for muscle spasms. TAKE 1 TABLET BY MOUTH TWICE A DAY AS NEEDED FOR MUSCLE SPASMS Patient taking differently: Take 10 mg by mouth 3 (three) times daily as needed for muscle spasms. 07/04/23  Yes Kathryne Hitch, MD  gabapentin (NEURONTIN) 300 MG capsule TAKE 1 CAPSULE BY MOUTH EVERYDAY AT BEDTIME Patient taking differently: Take 300 mg by mouth at bedtime. 04/23/23  Yes Kathryne Hitch, MD  ibuprofen (ADVIL) 200 MG tablet Take 600 mg by mouth every 6 (six) hours as needed for mild pain (pain score 1-3) or moderate pain (pain score 4-6).   Yes [provider]  oxyCODONE (OXY IR/ROXICODONE) 5 MG immediate release tablet Take 1-2 tablets (5-10 mg total) by mouth every 6 (six) hours as needed for moderate pain (pain score 4-6) (pain score 4-6). 07/04/23  Yes Kathryne Hitch, MD  sertraline (ZOLOFT) 25 MG tablet TAKE 1 TABLET (25 MG TOTAL) BY MOUTH DAILY. 07/03/23 12/30/23 Yes [name], DO  spironolactone (ALDACTONE) 25 MG tablet Take 1 tablet (25 mg total) by mouth daily. 07/18/22  Yes Nahser, Deloris Ping, MD  amLODipine (NORVASC) 10 MG tablet Take 1 tablet (10 mg total) by mouth daily. Patient not taking: Reported on 06/25/2023 11/21/22 04/23/23  Sharlene Dory, PA-C  atorvastatin (LIPITOR) 40 MG tablet Take 1 tablet (40 mg total) by mouth  daily. Patient not taking: Reported on 07/11/2023 11/21/22 07/02/23  Sharlene Dory, PA-C  budesonide-formoterol [facility]) 160-4.5 MCG/ACT inhaler Inhale 2 puffs into the lungs 2 (two) times daily. Patient not taking: Reported on 07/11/2023 04/23/23   Hunsucker, Lesia Sago, MD  metoprolol tartrate (LOPRESSOR) 25 MG tablet Take 0.5 tablets (12.5 mg total) by mouth 2 (two) times daily. 11/21/22 07/02/23  Sharlene Dory, PA-C  pantoprazole (PROTONIX) 40 MG tablet Take 1 tablet (40 mg total) by mouth daily. Patient not taking: Reported on 07/11/2023 12/11/22   [name], DO    Current Facility-Administered Medications  Medication Dose Route Frequency Provider Last Rate Last Admin   0.9 %  sodium chloride infusion  500 mL Intravenous Once Rachael Fee, MD       Current Outpatient Medications  Medication Sig Dispense Refill   acetaminophen (TYLENOL) 500 MG tablet Take 1,000 mg by mouth every 6 (six) hours as needed for moderate pain (pain score 4-6).     albuterol (VENTOLIN HFA) 108 (90 Base) MCG/ACT inhaler Inhale 2 puffs into the lungs every 4 (four) hours as needed for wheezing or shortness of breath. 18 each 1   aspirin 81 MG chewable tablet Chew 1 tablet (81 mg total) by mouth 2 (two) times daily. 30 tablet 0   cyclobenzaprine (FLEXERIL) 10 MG tablet Take 1 tablet (10 mg total) by mouth 3 (three) times daily as needed for muscle spasms. TAKE 1 TABLET BY MOUTH TWICE A DAY AS NEEDED FOR MUSCLE SPASMS (Patient taking differently: Take 10 mg by mouth 3 (three) times daily as needed for muscle spasms.) 30 tablet 3   gabapentin (NEURONTIN) 300 MG capsule TAKE 1 CAPSULE BY MOUTH EVERYDAY AT BEDTIME (Patient taking differently: Take 300 mg by mouth at bedtime.) 90 capsule 1   ibuprofen (ADVIL) 200 MG tablet Take 600 mg by mouth every 6 (six) hours as needed for mild pain (pain score 1-3) or moderate pain (pain score 4-6).     oxyCODONE (OXY IR/ROXICODONE) 5 MG immediate release tablet Take 1-2 tablets  (5-10 mg total) by mouth every 6 (six) hours as needed for moderate pain (pain score 4-6) (pain score 4-6). 30 tablet 0   sertraline (ZOLOFT) 25 MG tablet TAKE 1 TABLET (25 MG TOTAL) BY MOUTH DAILY. 90 tablet 2   spironolactone (ALDACTONE) 25 MG tablet Take 1 tablet (25 mg total) by mouth daily. 90 tablet 3   amLODipine (NORVASC) 10 MG tablet Take 1 tablet (10 mg total) by mouth daily. (Patient not taking: Reported on 06/25/2023) 90 tablet 3   atorvastatin (LIPITOR) 40 MG tablet Take 1 tablet (40 mg total) by mouth daily. (Patient not taking: Reported on 07/11/2023) 90 tablet 3   budesonide-formoterol (SYMBICORT) 160-4.5 MCG/ACT inhaler Inhale 2 puffs into the lungs 2 (two) times daily. (Patient not taking: Reported on 07/11/2023) 1 each 12   metoprolol tartrate (LOPRESSOR) 25 MG tablet Take 0.5 tablets (12.5 mg total) by mouth 2 (two) times daily. 90 tablet 2   pantoprazole (PROTONIX) 40 MG tablet Take 1 tablet (40 mg total) by mouth daily. (Patient not taking: Reported on 07/11/2023) 90 tablet 1    Allergies as of 07/11/2023 - Review Complete 07/11/2023  Allergen Reaction Noted   Lisinopril Swelling and Other (See Comments) 06/14/2017    Review of Systems  Constitutional:  Negative for chills, fever and weight loss.  HENT:  Negative for hearing loss and tinnitus.   Eyes:  Negative for blurred vision and double vision.  Respiratory: Negative.  Negative for cough and hemoptysis.   Cardiovascular:  Negative for chest pain and palpitations.  Gastrointestinal:  Positive for blood in stool. Negative for abdominal pain, constipation, diarrhea, heartburn, nausea and vomiting.  Genitourinary:  Negative for dysuria and urgency.  Musculoskeletal:  Negative for myalgias and neck pain.  Skin:  Negative for itching and rash.  Neurological:  Negative for seizures and loss of consciousness.  Psychiatric/Behavioral:  Negative for depression and suicidal ideas.        Physical Exam:  Vital signs in last 24  hours: Temp:  [98.4 F (36.9 C)] 98.4 F (36.9 C) (03/06 0747) Pulse Rate:  [67-90] 67 (03/06 0945) Resp:  [15-25] 16 (03/06 0945)  BP: (132-173)/(93-114) 154/100 (03/06 0945) SpO2:  [96 %-99 %] 98 % (03/06 0945) Weight:  [61.7 kg] 61.7 kg (03/06 0747)   Last BM recorded by nurses in past 5 days No data recorded  Physical Exam Constitutional:      Appearance: Normal appearance. She is not ill-appearing.  HENT:     Head: Normocephalic and atraumatic.     Nose: Nose normal. No congestion.     Mouth/Throat:     Mouth: Mucous membranes are moist.     Pharynx: Oropharynx is clear.  Eyes:     General: No scleral icterus. Cardiovascular:     Rate and Rhythm: Normal rate and regular rhythm.  Pulmonary:     Effort: Pulmonary effort is normal. No respiratory distress.  Abdominal:     General: Abdomen is flat. Bowel sounds are normal. There is no distension.     Palpations: Abdomen is soft. There is no mass.     Tenderness: There is no abdominal tenderness. There is no rebound.     Hernia: No hernia is present.  Musculoskeletal:        General: Normal range of motion.     Cervical back: Normal range of motion and neck supple.  Skin:    General: Skin is warm and dry.  Neurological:     General: No focal deficit present.     Mental Status: She is oriented to person, place, and time.  Psychiatric:        Mood and Affect: Mood normal.        Behavior: Behavior normal.        Thought Content: Thought content normal.        Judgment: Judgment normal.      LAB RESULTS: Recent Labs    07/11/23 0813  WBC 9.7  HGB 9.5*  HCT 29.8*  PLT 627*   BMET Recent Labs    07/11/23 0813  NA 136  K 3.8  CL 102  CO2 20*  GLUCOSE 96  BUN 18  CREATININE 1.23*  CALCIUM 9.7   LFT No results for input(s): "PROT", "ALBUMIN", "AST", "ALT", "ALKPHOS", "BILITOT", "BILIDIR", "IBILI" in the last 72 hours. PT/INR No results for input(s): "LABPROT", "INR" in the last 72 hours.  STUDIES: DG  Knee Complete 4 Views Right Result Date: 07/11/2023 CLINICAL DATA:  Recent surgery.  Worsening right knee pain. EXAM: RIGHT KNEE - COMPLETE 4+ VIEW COMPARISON:  Right knee radiographs 07/02/2023, 02/27/2023 FINDINGS: Redemonstration of total right knee arthroplasty. There again anterior surgical skin staples. No perihardware lucency is seen to indicate hardware failure or loosening. There is an increase in now moderate joint effusion. Interval decrease in persistent bubbles of intra-articular air. Resolution of the prior anterior distal thigh subcutaneous air. No acute fracture or dislocation. IMPRESSION: 1. Total right knee arthroplasty without evidence of hardware failure. 2. Interval increase in now moderate joint effusion. Interval decrease in intra-articular air. This may represent resolving postsurgical intra-articular air. It is difficult to exclude infection of the joint fluid. Recommend clinical correlation. Electronically Signed   By: Neita Garnet M.D.   On: 07/11/2023 08:34      Impression    Acute anemia Hgb 9.5 (down from 12.3 2/26) Platelets 627, likely reactive Hemoccult positive BUN 18, CR 1.23 Iron studies pending Recent knee replacement with history of daily 81 Mg aspirin and recent increase in ibuprofen (reportedly 600 Mg twice daily) use secondary to knee replacement.  Positive Hemoccult but patient does not check her stool so she  is unsure if she is having overt bleeding.  Longstanding history of GERD.  No previous EGD/colonoscopy.  DDx includes esophagitis, gastritis, PUD.  Right knee pain S/p total knee replacement 02/25  Asthma COPD   Plan   Plan for EGD/Colonoscopy tomorrow. I thoroughly discussed the procedures to include nature, alternatives, benefits, and risks including but not limited to bleeding, perforation, infection, anesthesia/cardiac and pulmonary complications. Patient provides understanding and gave verbal consent to proceed. Continue Protonix 40 mg IV  BID. Suprep, clear liquid diet, NPO at midnight. Continue daily CBC with transfusion as needed to maintain Hgb >7.    Thank you for your kind consultation, we will continue to follow.   Bayley Leanna Sato  07/11/2023, 10:31 AM    Attending physician's note  I have taken a history, reviewed the chart and examined the patient. I performed a substantive portion of this encounter, including complete performance of at least one of the key components, in conjunction with the APP. I agree with the APP's note, impression and recommendations.   58 year old female with history of CKD admitted with symptomatic acute anemia, heme positive stool in the setting of NSAID use Patient is poor historian, not sure if she had any melena or blood in stool Never had colonoscopy for colorectal cancer screening  Will plan to proceed with EGD and colonoscopy for further evaluation tomorrow Clear liquid diet Bowel prep Pantoprazole IV twice daily Avoid NSAIDs  Monitor hemoglobin and transfuse if below 7   The patient was provided an opportunity to ask questions and all were answered. The patient agreed with the plan and demonstrated an understanding of the instructions.  Iona Beard , MD [phone]

## 2023-07-11 NOTE — Hospital Course (Addendum)
 Symptomatic anemia Iron Deficiency  (+) FOBT  Patient remains hemodynamically stable with a stable hemoglobin of ***. EGD and colonoscopy did not reveal source of bleed. Anemia is most likely multifactorial with contribution from hemarthrosis and possible GI bleed that has resolved/ present in the small bowel, with the positive FOBT and melenic stool 3 weeks prior.GI has signed off and is recommending CBC outpatient check in 1-2 weeks, repeat colonoscopy due to inadequate bowel prep, and outpatient follow up .  S/P Right TKA Hemarthrosis   S/p total knee replacement 02/25. X-ray shows interval increase with no moderate joint effusion. Orthopedics were consulted who performed an aspiration of red blood consistent with a hematoma. PT recommended CIR.   Elevated Serum Creatinine  Most likely due to decrease in oral intake and increase in stool output from bowel prep.   Hx CVA Resumed home ASA 81mg  and statin.     Mildly thickened and calcified aortic valve  There is minimally restricted aortic valve movement, patient is asymptomatic and HDS.   Thrombocytosis At this time suspect this is reactive in setting of acute blood loss anemia / iron deficiency . Peripheral smear is normal    HTN BP has been okay so far. Home regimen is listed as amlodipine 10 mg daily, metoprolol 12.5 mg BID, spironolactone 25 mg daily. She states she has been taking amlodipine only at home, however fill history is supportive of taking spironolactone only. Held off on restarting anti-hypertensive therapy given relative normotension.   Asthma COPD No evidence of acute exacerbation. Not compliant with home breathing treatments. Continued home albuterol 2 puff q4h PRN wheezing, shortness of breath; plan to discuss outpatient maintenance regimen prior to discharge ***   Depression Continued home sertraline 25 mg daily

## 2023-07-11 NOTE — H&P (Signed)
 Date: 07/11/2023               Patient Name:  Jean [name] MRN: [id number]  DOB: 08-22-1965 Age / Sex: 58 y.o., female   PCP: [name], DO         Medical Service: Internal Medicine Teaching Service         Attending Physician: Dr. Reymundo Poll, MD    First Contact: Dr. Faith Rogue Pager: [phone]  Second Contact: Dr. Champ Mungo Pager: [phone]       After Hours (After 5p/  First Contact Pager: [phone]  weekends / holidays): Second Contact Pager: [phone]   Chief Complaint: R knee pain  History of Present Illness: Austyn Perriello is a 58 y.o. F with pertinent PMH of HTN, sickle cell trait, GERD, osteoarthritis w/ recent total R knee replacement who presented to [facility] ED with severe R knee pain admitted for symptomatic anemia.  She explains that she has been gradually increasing her activity level since R knee replacement a little over a week ago and that she has had some gradual increase in joint swelling and warmth, however it is now so painful that pain medications are not improving her pain at all and range of motion is limited due to pain. The knee is now much more warm and swollen as well.  She noticed last night that she began to feel very fatigued and dizzy some this morning. She has had cravings to chew on ice. She has not been monitoring the appearance of her stool so is unable to comment on if there has been frank blood or darker color. She has GERD and reports it to be pretty severe, and has not taken protonix in an unknown amount of time. She takes ASA 81 mg daily and has also been taking ibuprofen 600 mg BID since surgery.  She has never had bleeding episodes before. She has never had a colonoscopy.   She denies fevers, chills, nausea, vomiting, constipation, diarrhea.  Pertinent labs include Hgb 9.5, platelets 627; bicarbonate 20, creatinine 1.23 (roughly baseline), GFR 50 (roughly baseline); FOBT positive. R Knee x-ray with increased joint effusion  compared to prior. IMTS paged for admission for acute symptomatic anemia.  Past Medical History: Past Medical History:  Diagnosis Date   Adnexal mass 2019   likely remnant fibroid on broad ligament; getting diagnostic lap   Anxiety    on meds   Arthritis    hip-uses OTC meds   Asthma    uses inhaler as needed   Carpal tunnel syndrome    bilateral   Chest wall pain 10/15/2016   Chronic lower back pain    COPD (chronic obstructive pulmonary disease) (HCC)    uses in haler as needed   Depression    on meds   Dyspnea    with exertion   Dysrhythmia    GERD (gastroesophageal reflux disease)    Headache    migraines   Herpes    Hypertension    on meds   Liver lesion, right lobe 12/01/2015   CT chest without contrast 11/30/15 with hepatic lesions measuring 2.0 x 1.7 cm of the right lobe and 1 x 1 cm on the dome. CT abd/pel 8/18: 1.3cm inferior R liver lobe, prob benign but recommend 3-103mo f/u with MRI w/wo IV contrast with attenuation MRI 6/19: combination of benign cavernous hemangiomas and small benign cysts   PONV (postoperative nausea and vomiting)    Poor dental hygiene  chipped upper front and several loose teeth   Seasonal allergies    Sickle cell trait (HCC)    Substance abuse (HCC)    cocaine/crack cocaine - last use 02/07/18   SVD (spontaneous vaginal delivery)    x 1   Tobacco abuse 07/08/2015   Uses walker    for ambulation   Uterine leiomyoma 02/16/2016   s/p vaginal hysterectomy 2018   Vaginal discharge 03/26/2018   Past Surgical History: Past Surgical History:  Procedure Laterality Date   LAPAROSCOPIC OVARIAN CYSTECTOMY Right 02/25/2018   Procedure: LAPAROSCOPIC REMOVAL OF RIGHT ADNEXAL MASS;  Surgeon: Hermina Staggers, MD;  Location: [facility];  Service: Gynecology;  Laterality: Right;   SALPINGECTOMY Left    lap - ectopic preg   TOTAL HIP ARTHROPLASTY Left 01/20/2016   Procedure: LEFT TOTAL HIP ARTHROPLASTY ANTERIOR APPROACH;  Surgeon: Kathryne Hitch, MD;  Location: [facility];  Service: Orthopedics;  Laterality: Left;   TOTAL KNEE ARTHROPLASTY Right 07/02/2023   Procedure: RIGHT TOTAL KNEE ARTHROPLASTY;  Surgeon: Kathryne Hitch, MD;  Location: [facility];  Service: Orthopedics;  Laterality: Right;   TUBAL LIGATION     VAGINAL HYSTERECTOMY Right 12/11/2016   Procedure: HYSTERECTOMY VAGINAL WITH RIGHT SALPINGECTOMY;  Surgeon: Hermina Staggers, MD;  Location: [facility];  Service: Gynecology;  Laterality: Right;   Meds:  Current Facility-Administered Medications for the 07/11/23 encounter [facility] Encounter)  Medication   0.9 %  sodium chloride infusion   Current Meds  Medication Sig   acetaminophen (TYLENOL) 500 MG tablet Take 1,000 mg by mouth every 6 (six) hours as needed for moderate pain (pain score 4-6).   albuterol (VENTOLIN HFA) 108 (90 Base) MCG/ACT inhaler Inhale 2 puffs into the lungs every 4 (four) hours as needed for wheezing or shortness of breath.   aspirin 81 MG chewable tablet Chew 1 tablet (81 mg total) by mouth 2 (two) times daily.   cyclobenzaprine (FLEXERIL) 10 MG tablet Take 1 tablet (10 mg total) by mouth 3 (three) times daily as needed for muscle spasms. TAKE 1 TABLET BY MOUTH TWICE A DAY AS NEEDED FOR MUSCLE SPASMS (Patient taking differently: Take 10 mg by mouth 3 (three) times daily as needed for muscle spasms.)   gabapentin (NEURONTIN) 300 MG capsule TAKE 1 CAPSULE BY MOUTH EVERYDAY AT BEDTIME (Patient taking differently: Take 300 mg by mouth at bedtime.)   ibuprofen (ADVIL) 200 MG tablet Take 600 mg by mouth every 6 (six) hours as needed for mild pain (pain score 1-3) or moderate pain (pain score 4-6).   oxyCODONE (OXY IR/ROXICODONE) 5 MG immediate release tablet Take 1-2 tablets (5-10 mg total) by mouth every 6 (six) hours as needed for moderate pain (pain score 4-6) (pain score 4-6).   sertraline (ZOLOFT) 25 MG tablet TAKE 1 TABLET (25 MG TOTAL) BY MOUTH DAILY.   spironolactone (ALDACTONE) 25 MG tablet Take 1  tablet (25 mg total) by mouth daily.   Allergies: Allergies as of 07/11/2023 - Review Complete 07/11/2023  Allergen Reaction Noted   Lisinopril Swelling and Other (See Comments) 06/14/2017   Family History:  Family History  Problem Relation Age of Onset   Cancer Mother    Hypertension Mother    Diabetes Mother    Breast cancer Mother        diagnosed in her 55's   Stroke Father    Breast cancer Cousin    Colon cancer Neg Hx    Colon polyps Neg Hx    Esophageal  cancer Neg Hx    Rectal cancer Neg Hx    Stomach cancer Neg Hx    Social History: Dependent in ADLs and IADLs, states that she is waiting on an aide at home. Denies alcohol, tobacco, or other substance use. PCP is Dr. Ninfa Meeker with [facility].  Review of Systems: A complete ROS was negative except as per HPI.   Physical Exam: Blood pressure (!) 154/100, pulse 67, temperature 98.4 F (36.9 C), temperature source Oral, resp. rate 16, height 5\' 4"  (1.626 m), weight 61.7 kg, SpO2 98%. Constitutional:Appears stated age, resting comfortably in bed. In no acute distress. Cardio:Regular rate and rhythm. Murmur appreciated over RUSB with radiation to carotid. Pulm:Clear to auscultation bilaterally. Normal work of breathing on room air. Abdomen:Soft, nontender, nondistended. MSK/Skin:R knee is exquisitely tender to palpation with effusion over entire knee, increased proximally. The knee is much warmer than L knee.Staples are intact over surgical incision without drainage or dehiscence of wound. Neuro:Alert and oriented x3. No focal deficit noted. Psych:Pleasant mood and affect.  EKG: Pending  R knee x-ray: 1. Total right knee arthroplasty without evidence of hardware failure. 2. Interval increase in now moderate joint effusion. Interval decrease in intra-articular air. This may represent resolving postsurgical intra-articular air. It is difficult to exclude infection of the joint fluid. Recommend clinical correlation.  Assessment &  Plan by Problem: Principal Problem:   Symptomatic anemia  Symptomatic anemia S/p total knee replacement 02/25, incidentally noted to have downtrending Hgb on labs while being assessed for her knee pain. FOBT positive as well. She has never had a colonoscopy. She reports symptoms of dizziness, fatigue, and pica. She ahs sickle cell trait but denies prior complications from this. She has not had GI bleed before and is unable to comment on stool color as she has not been paying attention. BUN is WNL. Plan: -GI consulted, appreciate their recommendations -F/u iron studies, ferritin, folate, vitamin B12, reticulocytes -Avoid NSAIDs -Start protonix 40 mg BID -Trend CBC, transfuse for Hgb <7  R knee pain S/p total knee replacement 02/25. X-ray shows interval increase with no moderate joint effusion.. On exam, the knee is exquisitely tender to palpation with increased effusion more notably in suprapatellar area with increased warmth compared to L knee. Staples are intact and the wound appears clean without purulent drainage or discharge. Plan: -Pain management with tylenol 650 mg q6h PRN mild pain, oxycodone 5 mg q4h PRN moderate pain -PT while hospitalized -Will reach out to orthopedic surgery re: possible need for joint aspiration to rule out septic joint  Heart murmur Patient denies prior history of heart murmur. Last echocardiogram  04/2021 did not show evidence of aortic stenosis. Has not had an EKG since arrival. Plan: -F/u EKG -Repeat echocardiogram  Thrombocytosis At this time suspect this is reactive in setting of acute blood loss anemia. Plan: -Will check peripheral smear -Trend CBC  HTN BP has been okay so far. Home regimen is listed as amlodipine 10 mg daily, metoprolol 12.5 mg BID, spironolactone 25 mg daily. She states she has been taking amlodipine only at home, however fill history is supportive of taking spironolactone only.  Plan: -Will hold on anti-hypertensive therapy  given relative normotension and suspected ABLA  Asthma COPD No evidence of acute exacerbation. Not compliant with home breathing treatments. Plan: -Continue home albuterol 2 puff q4h PRN wheezing, shortness of breath; plan to discuss outpatient maintenance regimen prior to discharge  Depression Plan: -Continue home sertraline 25 mg daily  Hyperlipidemia Hx CVA Not compliant  with home atorvastatin 40 mg daily. She does report daily ASA 81 mg daily. Plan: -Restart atorvastatin 40 mg daily now -Will hold ASA 81 mg daily given c/f acute UGIB   Dispo: Admit patient to Inpatient with expected length of stay greater than 2 midnights.  SignedChamp Mungo, DO 07/11/2023, 11:33 AM  After 5pm on weekdays and 1pm on weekends: On Call pager: [phone]

## 2023-07-11 NOTE — Telephone Encounter (Addendum)
 2nd attempt, no answer, LVM for call back. Placed in call back.

## 2023-07-11 NOTE — ED Triage Notes (Signed)
 PT BIB EMS from home with knee pain. Total knee replacement on 07/02/23 and d/c on 07/05/23. Pt reports knee pain and swelling with tenderness to the touch.   16 rr 160/110 hx htn 97% RA 97 hr

## 2023-07-11 NOTE — Telephone Encounter (Signed)
 3rd attempt to call pt as requested. Since last call back attempt, pt's status has changed per pt chart, pt was admitted to hospital from ED. No answer, LVM for call back as needed.

## 2023-07-11 NOTE — Progress Notes (Signed)
 Echocardiogram 2D Echocardiogram has been performed.  Jean [name] 07/11/2023, 4:55 PM

## 2023-07-11 NOTE — ED Provider Notes (Signed)
 [place] EMERGENCY DEPARTMENT AT [facility] Provider Note   CSN: [id number] Arrival date & time: 07/11/23  0744     History  Chief Complaint  Patient presents with   Knee Pain   Post-op Problem    CHEROKEE BOCCIO is a 58 y.o. female.  KASSONDRA GEIL is a 58 y.o. female with history of recent right total knee replacement by Dr. Rayburn Ma, who presents to the ED for evaluation of worsening knee pain.  Patient reports knee surgery was done on 2/25 and she was discharged on 2/28.  Over the last 2 days she has been more active and up trying to walk on the knee more and started having worsening pain yesterday.  She reports that she has been using ibuprofen, Tylenol and oxycodone to manage her pain.  Last dose of oxycodone at 8 PM last night.  She reports the pain this morning was a lot worse and she did not know what to do so called EMS.  She denies any fevers or chills.  She was worried that the knee could be infected, has not removed the dressing since her surgery as instructed.  She has not noted any other symptoms.  She does report that she has been constipated since surgery and has not had a bowel movement but denies any abdominal pain or vomiting.  The history is provided by the patient and medical records.  Knee Pain Associated symptoms: no fever        Home Medications Prior to Admission medications   Medication Sig Start Date End Date Taking? Authorizing Provider  acetaminophen (TYLENOL) 500 MG tablet Take 500 mg by mouth every 6 (six) hours as needed for moderate pain.    [provider]  albuterol (VENTOLIN HFA) 108 (90 Base) MCG/ACT inhaler Inhale 2 puffs into the lungs every 4 (four) hours as needed for wheezing or shortness of breath. 04/23/23   Hunsucker, Lesia Sago, MD  amLODipine (NORVASC) 10 MG tablet Take 1 tablet (10 mg total) by mouth daily. Patient not taking: Reported on 06/25/2023 11/21/22 04/23/23  Sharlene Dory, PA-C  aspirin 81 MG chewable  tablet Chew 1 tablet (81 mg total) by mouth 2 (two) times daily. 07/04/23   Kathryne Hitch, MD  atorvastatin (LIPITOR) 40 MG tablet Take 1 tablet (40 mg total) by mouth daily. 11/21/22 07/02/23  Sharlene Dory, PA-C  Budeson-Glycopyrrol-Formoterol (BREZTRI AEROSPHERE) 160-9-4.8 MCG/ACT AERO Inhale 2 puffs into the lungs in the morning and at bedtime. 04/23/23   Hunsucker, Lesia Sago, MD  budesonide-formoterol (SYMBICORT) 160-4.5 MCG/ACT inhaler Inhale 2 puffs into the lungs 2 (two) times daily. 04/23/23   Hunsucker, Lesia Sago, MD  cyclobenzaprine (FLEXERIL) 10 MG tablet Take 1 tablet (10 mg total) by mouth 3 (three) times daily as needed for muscle spasms. TAKE 1 TABLET BY MOUTH TWICE A DAY AS NEEDED FOR MUSCLE SPASMS 07/04/23   Kathryne Hitch, MD  famotidine (PEPCID) 20 MG tablet TAKE 1 TABLET BY MOUTH TWICE A DAY Patient not taking: Reported on 06/25/2023 01/09/23   [name], DO  gabapentin (NEURONTIN) 300 MG capsule TAKE 1 CAPSULE BY MOUTH EVERYDAY AT BEDTIME 04/23/23   Kathryne Hitch, MD  metoprolol tartrate (LOPRESSOR) 25 MG tablet Take 0.5 tablets (12.5 mg total) by mouth 2 (two) times daily. 11/21/22 07/02/23  Sharlene Dory, PA-C  oxyCODONE (OXY IR/ROXICODONE) 5 MG immediate release tablet Take 1-2 tablets (5-10 mg total) by mouth every 6 (six) hours as needed for moderate  pain (pain score 4-6) (pain score 4-6). 07/04/23   Kathryne Hitch, MD  pantoprazole (PROTONIX) 40 MG tablet Take 1 tablet (40 mg total) by mouth daily. 12/11/22   [name], DO  sertraline (ZOLOFT) 25 MG tablet TAKE 1 TABLET (25 MG TOTAL) BY MOUTH DAILY. 07/03/23 12/30/23  [name], DO  spironolactone (ALDACTONE) 25 MG tablet Take 1 tablet (25 mg total) by mouth daily. 07/18/22   Nahser, Deloris Ping, MD  tetrahydrozoline 0.05 % ophthalmic solution Place 1 drop into both eyes daily as needed (for dry eyes).     [provider]      Allergies    Lisinopril    Review  of Systems   Review of Systems  Constitutional:  Negative for chills and fever.  Respiratory:  Negative for shortness of breath.   Gastrointestinal:  Positive for constipation. Negative for abdominal pain, blood in stool, nausea and vomiting.  Genitourinary:  Negative for dysuria, frequency and hematuria.  Musculoskeletal:  Positive for arthralgias.    Physical Exam Updated Vital Signs BP (!) 138/114   Pulse 89   Temp 98.4 F (36.9 C) (Oral)   Resp 18   Ht 5\' 4"  (1.626 m)   Wt 61.7 kg   LMP  (LMP Unknown)   SpO2 98%   BMI 23.34 kg/m  Physical Exam Vitals and nursing note reviewed.  Constitutional:      General: She is not in acute distress.    Appearance: Normal appearance. She is well-developed. She is not diaphoretic.  HENT:     Head: Normocephalic and atraumatic.  Eyes:     General:        Right eye: No discharge.        Left eye: No discharge.     Pupils: Pupils are equal, round, and reactive to light.  Cardiovascular:     Rate and Rhythm: Normal rate and regular rhythm.     Pulses: Normal pulses.     Heart sounds: Normal heart sounds.  Pulmonary:     Effort: Pulmonary effort is normal. No respiratory distress.     Breath sounds: Normal breath sounds. No wheezing or rales.     Comments: Respirations equal and unlabored, patient able to speak in full sentences, lungs clear to auscultation bilaterally  Abdominal:     General: Bowel sounds are normal. There is no distension.     Palpations: Abdomen is soft. There is no mass.     Tenderness: There is no abdominal tenderness. There is no guarding.     Comments: Abdomen soft, nondistended, nontender to palpation in all quadrants without guarding or peritoneal signs  Genitourinary:    Comments: Chaperone present during rectal exam No external hemorrhoids noted, normal rectal tone, small amount of brown stool on exam with palpable hardened stool in the rectal vault.  No gross blood or melena noted. Musculoskeletal:         General: Tenderness present. No deformity.     Cervical back: Neck supple.     Comments: Right knee with hydrocolloid dressing in place, dressing removed and anterior staple line is clean dry and intact without erythema or purulence.  There is a mild effusion to the knee but it is not red or warm to the touch patient is able to flex and extend the knee with some discomfort.  Distal pulses 2+  Skin:    General: Skin is warm and dry.     Capillary Refill: Capillary refill takes less than 2 seconds.  Neurological:     Mental Status: She is alert and oriented to person, place, and time.     Coordination: Coordination normal.     Comments: Speech is clear, able to follow commands CN III-XII intact Normal strength in upper and lower extremities bilaterally including dorsiflexion and plantar flexion, strong and equal grip strength Sensation normal to light and sharp touch Moves extremities without ataxia, coordination intact  Psychiatric:        Mood and Affect: Mood normal.        Behavior: Behavior normal.     ED Results / Procedures / Treatments   Labs (all labs ordered are listed, but only abnormal results are displayed) Labs Reviewed  BASIC METABOLIC PANEL - Abnormal; Notable for the following components:      Result Value   CO2 20 (*)    Creatinine, Ser 1.23 (*)    GFR, Estimated 50 (*)    All other components within normal limits  CBC WITH DIFFERENTIAL/PLATELET - Abnormal; Notable for the following components:   RBC 3.04 (*)    Hemoglobin 9.5 (*)    HCT 29.8 (*)    Platelets 627 (*)    All other components within normal limits  POC OCCULT BLOOD, ED - Abnormal; Notable for the following components:   Fecal Occult Bld POSITIVE (*)    All other components within normal limits  VITAMIN B12  FOLATE  IRON AND TIBC  FERRITIN  RETICULOCYTES  TYPE AND SCREEN    EKG None  Radiology DG Knee Complete 4 Views Right Result Date: 07/11/2023 CLINICAL DATA:  Recent surgery.   Worsening right knee pain. EXAM: RIGHT KNEE - COMPLETE 4+ VIEW COMPARISON:  Right knee radiographs 07/02/2023, 02/27/2023 FINDINGS: Redemonstration of total right knee arthroplasty. There again anterior surgical skin staples. No perihardware lucency is seen to indicate hardware failure or loosening. There is an increase in now moderate joint effusion. Interval decrease in persistent bubbles of intra-articular air. Resolution of the prior anterior distal thigh subcutaneous air. No acute fracture or dislocation. IMPRESSION: 1. Total right knee arthroplasty without evidence of hardware failure. 2. Interval increase in now moderate joint effusion. Interval decrease in intra-articular air. This may represent resolving postsurgical intra-articular air. It is difficult to exclude infection of the joint fluid. Recommend clinical correlation. Electronically Signed   By: Neita Garnet M.D.   On: 07/11/2023 08:34    Procedures Procedures    Medications Ordered in ED Medications  oxyCODONE (Oxy IR/ROXICODONE) immediate release tablet 5 mg (has no administration in time range)  pantoprazole (PROTONIX) injection 40 mg (has no administration in time range)  HYDROmorphone (DILAUDID) injection 1 mg (1 mg Intravenous Given 07/11/23 1610)    ED Course/ Medical Decision Making/ A&P                                 Medical Decision Making Amount and/or Complexity of Data Reviewed Labs: ordered. Radiology: ordered.  Risk Prescription drug management.   58 y.o. female presents to the ED with complaints of knee pain, this involves an extensive number of treatment options, and is a complaint that carries with it a high risk of complications and morbidity.  The differential diagnosis includes postoperative pain, postop effusion, hemarthrosis, septic arthritis  On arrival pt is nontoxic, vitals show mildly elevated blood pressure patient has not taken her antihypertensives this morning otherwise stable.    Additional history obtained from chart review and  EMS personnel. Previous records obtained and reviewed   I ordered medication IV Dilaudid for pain control  Lab Tests:  I Ordered, reviewed, and interpreted labs, which included: No cytosis but patient noted to have a 3 point drop in her hemoglobin from 8 days ago, prior to that it looks like hemoglobin was very stable around 12.  Only 50 mL of estimated blood loss from surgery, concern for GI bleeding or other etiology for hemoglobin drop.  No significant electrolyte derangements.  Fecal occult is positive.  Will send repeat hemoglobin and anemia panel as well as type and screen.  Imaging Studies ordered:  I ordered imaging studies which included right knee x-ray, I independently visualized and interpreted imaging which showed slight increase in effusion, otherwise unremarkable  ED Course:   Knee pain well-controlled with medication here in the ED, at workup and exam today is not indicative of infection of recently replaced joint.  Hydrocolloid dressing replaced.  Consult placed to [facility] regarding GI bleeding, concern for NSAID induced GI bleed since patient has been using ibuprofen and aspirin.  They will see patient in consult.  Patient started on IV Protonix.  Consult placed to internal medicine teaching service for admission for GI bleed with 3 g hemoglobin drop, currently not meeting criteria for transfusion but hemoglobin will need to be trended.    Portions of this note were generated with Scientist, clinical (histocompatibility and immunogenetics). Dictation errors may occur despite best attempts at proofreading.         Final Clinical Impression(s) / ED Diagnoses Final diagnoses:  Symptomatic anemia  Occult blood in stools  Acute pain of right knee    Rx / DC Orders ED Discharge Orders     None         Velda Shell 07/11/23 1202    Terald Sleeper, MD 07/11/23 [phone]

## 2023-07-12 ENCOUNTER — Observation Stay (HOSPITAL_COMMUNITY): Admitting: Anesthesiology

## 2023-07-12 ENCOUNTER — Encounter (HOSPITAL_COMMUNITY)
Admission: EM | Disposition: A | Discharge status: Home Health | Payer: Self-pay | Source: Home / Self Care | Attending: Emergency Medicine

## 2023-07-12 ENCOUNTER — Other Ambulatory Visit: Payer: Self-pay | Admitting: Orthopaedic Surgery

## 2023-07-12 ENCOUNTER — Encounter (HOSPITAL_COMMUNITY): Payer: Self-pay | Admitting: Internal Medicine

## 2023-07-12 DIAGNOSIS — D649 Anemia, unspecified: Secondary | ICD-10-CM

## 2023-07-12 DIAGNOSIS — K449 Diaphragmatic hernia without obstruction or gangrene: Secondary | ICD-10-CM

## 2023-07-12 DIAGNOSIS — D500 Iron deficiency anemia secondary to blood loss (chronic): Secondary | ICD-10-CM

## 2023-07-12 DIAGNOSIS — M2500 Hemarthrosis, unspecified joint: Secondary | ICD-10-CM | POA: Insufficient documentation

## 2023-07-12 DIAGNOSIS — K921 Melena: Secondary | ICD-10-CM | POA: Insufficient documentation

## 2023-07-12 DIAGNOSIS — K648 Other hemorrhoids: Secondary | ICD-10-CM

## 2023-07-12 DIAGNOSIS — D509 Iron deficiency anemia, unspecified: Secondary | ICD-10-CM | POA: Diagnosis not present

## 2023-07-12 DIAGNOSIS — Z87891 Personal history of nicotine dependence: Secondary | ICD-10-CM | POA: Diagnosis not present

## 2023-07-12 DIAGNOSIS — I10 Essential (primary) hypertension: Secondary | ICD-10-CM | POA: Diagnosis not present

## 2023-07-12 DIAGNOSIS — K2970 Gastritis, unspecified, without bleeding: Secondary | ICD-10-CM

## 2023-07-12 DIAGNOSIS — K644 Residual hemorrhoidal skin tags: Secondary | ICD-10-CM | POA: Diagnosis not present

## 2023-07-12 DIAGNOSIS — R195 Other fecal abnormalities: Secondary | ICD-10-CM

## 2023-07-12 HISTORY — PX: ESOPHAGOGASTRODUODENOSCOPY: SHX5428

## 2023-07-12 HISTORY — PX: COLONOSCOPY: SHX5424

## 2023-07-12 LAB — VITAMIN B12: Vitamin B-12: 564 pg/mL (ref 180–914)

## 2023-07-12 LAB — COMPREHENSIVE METABOLIC PANEL
ALT: 12 U/L (ref 0–44)
AST: 13 U/L — ABNORMAL LOW (ref 15–41)
Albumin: 2.6 g/dL — ABNORMAL LOW (ref 3.5–5.0)
Alkaline Phosphatase: 52 U/L (ref 38–126)
Anion gap: 7 (ref 5–15)
BUN: 14 mg/dL (ref 6–20)
CO2: 24 mmol/L (ref 22–32)
Calcium: 9 mg/dL (ref 8.9–10.3)
Chloride: 104 mmol/L (ref 98–111)
Creatinine, Ser: 1.11 mg/dL — ABNORMAL HIGH (ref 0.44–1.00)
GFR, Estimated: 57 mL/min — ABNORMAL LOW (ref >60)
Glucose, Bld: 87 mg/dL (ref 70–99)
Potassium: 3.8 mmol/L (ref 3.5–5.1)
Sodium: 135 mmol/L (ref 135–145)
Total Bilirubin: 0.4 mg/dL (ref 0.0–1.2)
Total Protein: 6.6 g/dL (ref 6.5–8.1)

## 2023-07-12 LAB — CBC
HCT: 26 % — ABNORMAL LOW (ref 36.0–46.0)
Hemoglobin: 8.6 g/dL — ABNORMAL LOW (ref 12.0–15.0)
MCH: 31.5 pg (ref 26.0–34.0)
MCHC: 33.1 g/dL (ref 30.0–36.0)
MCV: 95.2 fL (ref 80.0–100.0)
Platelets: 580 10*3/uL — ABNORMAL HIGH (ref 150–400)
RBC: 2.73 MIL/uL — ABNORMAL LOW (ref 3.87–5.11)
RDW: 13 % (ref 11.5–15.5)
WBC: 7.7 10*3/uL (ref 4.0–10.5)
nRBC: 0 % (ref 0.0–0.2)

## 2023-07-12 LAB — FOLATE: Folate: 23.1 ng/mL (ref >5.9)

## 2023-07-12 SURGERY — EGD (ESOPHAGOGASTRODUODENOSCOPY)
Anesthesia: Monitor Anesthesia Care

## 2023-07-12 MED ORDER — SODIUM CHLORIDE 0.9 % IV SOLN
200.0000 mg | Freq: Once | INTRAVENOUS | Status: AC
  Administered 2023-07-12: 200 mg via INTRAVENOUS
  Filled 2023-07-12: qty 10

## 2023-07-12 MED ORDER — SODIUM CHLORIDE 0.9 % IV SOLN: INTRAVENOUS | Status: DC | PRN

## 2023-07-12 MED ORDER — PHENYLEPHRINE HCL (PRESSORS) 10 MG/ML IV SOLN
INTRAVENOUS | Status: DC | PRN
  Administered 2023-07-12: 120 ug via INTRAVENOUS

## 2023-07-12 MED ORDER — IRON SUCROSE 200 MG IVPB - SIMPLE MED
200.0000 mg | Freq: Once | Status: DC
  Filled 2023-07-12: qty 110

## 2023-07-12 MED ORDER — FLEET ENEMA RE ENEM
1.0000 | ENEMA | Freq: Once | RECTAL | Status: AC
  Administered 2023-07-12: 1 via RECTAL
  Filled 2023-07-12: qty 1

## 2023-07-12 MED ORDER — PROPOFOL 500 MG/50ML IV EMUL
INTRAVENOUS | Status: DC | PRN
  Administered 2023-07-12: 150 ug/kg/min via INTRAVENOUS

## 2023-07-12 NOTE — Transfer of Care (Signed)
 Immediate Anesthesia Transfer of Care Note  Patient: Jean [name]  Procedure(s) Performed: EGD (ESOPHAGOGASTRODUODENOSCOPY) COLONOSCOPY  Patient Location: PACU  Anesthesia Type:MAC  Level of Consciousness: awake and alert   Airway & Oxygen Therapy: Patient Spontanous Breathing and Patient connected to nasal cannula oxygen  Post-op Assessment: Report given to RN and Post -op Vital signs reviewed and stable  Post vital signs: Reviewed and stable  Last Vitals:  Vitals Value Taken Time  BP 105/88 07/12/23 1420  Temp 36.2 C 07/12/23 1417  Pulse 96 07/12/23 1425  Resp 20 07/12/23 1425  SpO2 96 % 07/12/23 1425  Vitals shown include unfiled device data.  Last Pain:  Vitals:   07/12/23 1417  TempSrc: Temporal  PainSc: Asleep         Complications: No notable events documented.

## 2023-07-12 NOTE — Progress Notes (Signed)
 PT Cancellation Note  Patient Details Name: Jean [name] MRN: [id number] DOB: 1966/03/28   Cancelled Treatment:    Reason Eval/Treat Not Completed: Patient at procedure or test/unavailable;Other (comment). Pt being prepped for colonoscopy with multiple enemas. Will continue attempts.    Angelina Ok [facility] 07/12/2023, 11:25 AM  Skip Mayer PT Acute Rehabilitation Services Office [phone]

## 2023-07-12 NOTE — Op Note (Signed)
 [facility] Patient Name: Jean [name] Procedure Date : 07/12/2023 MRN: [id number] Attending MD: Napoleon Form , MD, [id number] Date of Birth: 11-07-1965 CSN: [id number] Age: 58 Admit Type: Inpatient Procedure:                Colonoscopy Indications:              Evaluation of unexplained GI bleeding presenting                            with fecal occult blood, Unexplained iron                            deficiency anemia Providers:                Napoleon Form, MD, Margaree Mackintosh, RN,                            Priscella Mann, Technician Referring MD:              Medicines:                 Complications:            No immediate complications. Estimated Blood Loss:     Estimated blood loss: none. Procedure:                Pre-Anesthesia Assessment:                           - Prior to the procedure, a History and Physical                            was performed, and patient medications and                            allergies were reviewed. The patient's tolerance of                            previous anesthesia was also reviewed. The risks                            and benefits of the procedure and the sedation                            options and risks were discussed with the patient.                            All questions were answered, and informed consent                            was obtained. Prior Anticoagulants: The patient has                            taken no anticoagulant or antiplatelet agents. ASA                            Grade Assessment: III -  A patient with severe                            systemic disease. After reviewing the risks and                            benefits, the patient was deemed in satisfactory                            condition to undergo the procedure.                           After obtaining informed consent, the colonoscope                            was passed under direct vision. Throughout the                             procedure, the patient's blood pressure, pulse, and                            oxygen saturations were monitored continuously. The                            [id number]-HQ190L ([id number] Olympus colonoscope was                            introduced through the anus and advanced to the the                            cecum, identified by appendiceal orifice and                            ileocecal valve. The colonoscopy was performed                            without difficulty. The patient tolerated the                            procedure well. The quality of the bowel                            preparation was inadequate. The ileocecal valve,                            appendiceal orifice, and rectum were photographed. Scope In: 1:55:10 PM Scope Out: 2:10:21 PM Scope Withdrawal Time: 0 hours 11 minutes 0 seconds  Total Procedure Duration: 0 hours 15 minutes 11 seconds  Findings:      The perianal and digital rectal examinations were normal.      Semi-liquid semi-solid stool was found in the sigmoid colon, in the       transverse colon, in the ascending colon and in the cecum, making       visualization difficult. Lavage of the area was performed, resulting in  incomplete clearance with fair visualization.      Non-bleeding external and internal hemorrhoids were found during       retroflexion. The hemorrhoids were small. Impression:               - Preparation of the colon was inadequate.                           - Stool in the sigmoid colon, in the transverse                            colon, in the ascending colon and in the cecum.                           - Non-bleeding external and internal hemorrhoids.                           - No obvious large mass or polyps but could have                            missed small or flat polyps                           - No obvious lesion to account for acute blood loss                            anemia based on EGD  or colonoscopy findings                           - Acute blood loss anemia, likely secondary to                            intra and post op R knee total arthroplasty related                            blood loss (had hematoma in the R knee that                            required drainage)                           - No specimens collected. Recommendation:           - Resume previous diet.                           - Continue present medications.                           - Repeat colonoscopy at appointment to be scheduled                            as outpatient for colorectal cancer screening                            because the bowel  preparation was suboptimal.                           - Repeat CBC with PCP in 1-2 weeks                           - Follow up in GI office next available appt, if                            has persistent iron deficiency anemia, will                            consider small bowel video capsule study for                            further work up of iron deficiency anemia                           - Inpatient GI signing off, please call with any                            questions Procedure Code(s):        --- Professional ---                           [phone], Colonoscopy, flexible; diagnostic, including                            collection of specimen(s) by brushing or washing,                            when performed (separate procedure) Diagnosis Code(s):        --- Professional ---                           K64.8, Other hemorrhoids                           R19.5, Other fecal abnormalities                           D50.9, Iron deficiency anemia, unspecified CPT copyright 2022 American [employer] Association. All rights reserved. The codes documented in this report are preliminary and upon coder review may  be revised to meet current compliance requirements. Napoleon Form, MD 07/12/2023 2:32:37 PM This report has been signed  electronically. Number of Addenda: 0

## 2023-07-12 NOTE — Interval H&P Note (Signed)
 History and Physical Interval Note:  07/12/2023 1:25 PM  Jean [name]  has presented today for surgery, with the diagnosis of Acute blood loss anemia, GERD, history of NSAID use, heme positive stool.  The various methods of treatment have been discussed with the patient and family. After consideration of risks, benefits and other options for treatment, the patient has consented to  Procedure(s): EGD (ESOPHAGOGASTRODUODENOSCOPY) (N/A) COLONOSCOPY (N/A) as a surgical intervention.  The patient's history has been reviewed, patient examined, no change in status, stable for surgery.  I have reviewed the patient's chart and labs.  Questions were answered to the patient's satisfaction.     [name]

## 2023-07-12 NOTE — TOC CM/SW Note (Addendum)
 Transition of Care [facility] : [facility]) - Inpatient Brief Assessment   Patient Details  Name: Jean [name] MRN: [id number] Date of Birth: 1965-08-22  Transition of Care [facility]) CM/SW Contact:    Tom-Johnson, Hershal Coria, RN Phone Number: 07/12/2023, 4:46 PM   Clinical Narrative:  Patient presented to the ED with worsening Rt Knee pain. Admitted with Symptomatic Anemia. Hgb on admit was 9.3 and down to 8.3 today. GI following. Underwent EGD and Colonoscopy today 07/12/23.  Patient recently underwent Rt Total knee replacement on 07/02/23 and discharged home with home health PT on 07/05/23. Patient's knee was aspirated in the ED and 40ml of dark blood noted, consistent with Hematoma. Orthopedic following. Patient on WBAT.   From home alone, has a daughter who lives in [place]. States she has three siblings but unable to assist. Not employed, on disability. Applying for [employer] transportation.  Has a cane, walker and rollator at home.  PCP is [name], DO and uses [facility] on [address].   Patient is active with home health disciplines with [facility]. CM sent resumption of car order to [employer] with acceptance noted, info on AVS.   Patient not Medically ready for discharge.  CM will continue to follow as patient progresses with care towards discharge.         Transition of Care Asessment: Insurance and Status: Insurance coverage has been reviewed Patient has primary care physician: Yes Home environment has been reviewed: Yes Prior level of function:: Modified Independent Prior/Current Home Services: Current home services (Home health with [facility]) Social Drivers of Health Review: SDOH reviewed no interventions necessary Readmission risk has been reviewed: Yes Transition of care needs: transition of care needs identified, TOC will continue to follow

## 2023-07-12 NOTE — Progress Notes (Addendum)
 Subjective: Jean [name] is a 58 y.o. F with pertinent PMH of HTN, sickle cell trait, GERD, osteoarthritis w/ recent total R knee replacement who presented to [facility] ED with severe R knee pain admitted for symptomatic anemia.   Today, patient is having R knee pain and is having a hard time with the bowel prep (due to using restroom with knee pain). She recalls melenic stools one time about 3 weeks ago but denies additional bleeding. She still feels dizzy today.    Objective:  Vital signs in last 24 hours: Vitals:   07/11/23 1716 07/11/23 2123 07/12/23 0543 07/12/23 0733  BP: (!) 129/94 (!) 151/82 137/71 (!) 142/91  Pulse: 82 79 88 89  Resp:  18 18   Temp:  98.3 F (36.8 C) 98.3 F (36.8 C)   TempSrc:      SpO2: 98% 98% 97% 98%  Weight:      Height:       Physical Exam: General:NAD, laying in bed  Cardiac:RRR, systolic murmur present on RUSB  Pulmonary: normal effort on room air, clear to auscultate bilaterally  Abdominal:soft, LLQ tenderness  Neuro:awake, alert  MSK:R knee tenderness with clean/dry bandage present  Skin:warm and dry  Psych:  normal mood and affect      Latest Ref Rng & Units 07/12/2023    6:11 AM 07/11/2023   10:52 AM 07/11/2023    8:13 AM  CBC  WBC 4.0 - 10.5 K/uL 7.7  8.6  9.7   Hemoglobin 12.0 - 15.0 g/dL 8.6  9.3  9.5   Hematocrit 36.0 - 46.0 % 26.0  29.3  29.8   Platelets 150 - 400 K/uL 580  598  627        Latest Ref Rng & Units 07/12/2023    6:11 AM 07/11/2023   10:52 AM 07/11/2023    8:13 AM  CMP  Glucose 70 - 99 mg/dL 87   96   BUN 6 - 20 mg/dL 14   18   Creatinine 2.95 - 1.00 mg/dL 6.21  3.08  6.57   Sodium 135 - 145 mmol/L 135   136   Potassium 3.5 - 5.1 mmol/L 3.8   3.8   Chloride 98 - 111 mmol/L 104   102   CO2 22 - 32 mmol/L 24   20   Calcium 8.9 - 10.3 mg/dL 9.0   9.7   Total Protein 6.5 - 8.1 g/dL 6.6     Total Bilirubin 0.0 - 1.2 mg/dL 0.4     Alkaline Phos 38 - 126 U/L 52     AST 15 - 41 U/L 13     ALT 0 - 44 U/L 12         Assessment/Plan:  Principal Problem:   Symptomatic anemia  Symptomatic anemia Iron Deficiency  (+) FOBT  Patient remains hemodynamically stable with a stable hemoglobin of 8.6. No recent overt GI bleeding noted by the patient.  GI is consulted and is recommending EDG and colonoscopy today.  Plan: -Follow up EGD/ colonoscopy results and recommendations -IV PPI BID  -IV iron today due to low saturation ratio, ferritin elevated due to acute inflammation -Continue to avoid NSAIDs  -Trend CBC, transfuse for Hgb <7   S/P Right TKA Hemarthrosis   S/p total knee replacement 02/25. X-ray shows interval increase with no moderate joint effusion. Orthopedics were consulted who performed an aspiration of red blood consistent with a hematoma.  Plan: -Continue PT/OT   -Orthopedics following  Hx CVA Recommend we resume ASA following endoscopy  Continue lipitor   Mildly thickened and calcified aortic valve  There is minimally restricted aortic valve movement, patient is asymptomatic and HDS. Plan: -F/u EKG -Continue to monitor    Thrombocytosis At this time suspect this is reactive in setting of acute blood loss anemia / iron deficiency . Peripheral smear is normal  Plan: -Trend CBC   HTN BP has been okay so far. Home regimen is listed as amlodipine 10 mg daily, metoprolol 12.5 mg BID, spironolactone 25 mg daily. She states she has been taking amlodipine only at home, however fill history is supportive of taking spironolactone only.  Plan: -Will hold on anti-hypertensive therapy given relative normotension and acute blood loss anemia    Asthma COPD No evidence of acute exacerbation. Not compliant with home breathing treatments. Plan: -Continue home albuterol 2 puff q4h PRN wheezing, shortness of breath; plan to discuss outpatient maintenance regimen prior to discharge   Depression Plan: -Continue home sertraline 25 mg daily   Resolved Problems:   __________________________________  Code Status: Full  VTE Prophylaxis:SCDs Diet:NPO IVF:N/A Barriers to Discharge: Evaluation of anemia  Dispo: Anticipated discharge in approximately 1 day(s).   Faith Rogue, DO 07/12/2023, 11:26 AM Pager: [phone] After 5pm on weekdays and 1pm on weekends: On Call pager [phone]

## 2023-07-12 NOTE — Anesthesia Preprocedure Evaluation (Signed)
 Anesthesia Evaluation  Patient identified by MRN, date of birth, ID band Patient awake    Reviewed: Allergy & Precautions, H&P , NPO status , Patient's Chart, lab work & pertinent test results  History of Anesthesia Complications (+) PONV and history of anesthetic complications  Airway Mallampati: II   Neck ROM: full    Dental   Pulmonary shortness of breath, asthma , COPD, former smoker   breath sounds clear to auscultation       Cardiovascular hypertension,  Rhythm:regular Rate:Normal     Neuro/Psych    GI/Hepatic ,GERD  ,,(+)     substance abuse  cocaine use  Endo/Other    Renal/GU      Musculoskeletal  (+) Arthritis ,    Abdominal   Peds  Hematology  (+) Blood dyscrasia, anemia Hemoglobin 8.6   Anesthesia Other Findings   Reproductive/Obstetrics                             Anesthesia Physical Anesthesia Plan  ASA: 3  Anesthesia Plan: MAC   Post-op Pain Management:    Induction: Intravenous  PONV Risk Score and Plan: 3 and Propofol infusion and Treatment may vary due to age or medical condition  Airway Management Planned: Nasal Cannula  Additional Equipment:   Intra-op Plan:   Post-operative Plan:   Informed Consent: I have reviewed the patients History and Physical, chart, labs and discussed the procedure including the risks, benefits and alternatives for the proposed anesthesia with the patient or authorized representative who has indicated his/her understanding and acceptance.     Dental advisory given  Plan Discussed with: CRNA, Anesthesiologist and Surgeon  Anesthesia Plan Comments:        Anesthesia Quick Evaluation

## 2023-07-12 NOTE — Op Note (Signed)
 [facility] Patient Name: Jean [name] Procedure Date : 07/12/2023 MRN: [id number] Attending MD: Napoleon Form , MD, [id number] Date of Birth: 04-Nov-1965 CSN: [id number] Age: 58 Admit Type: Inpatient Procedure:                Upper GI endoscopy Indications:              Suspected upper gastrointestinal bleeding in                            patient with unexplained iron deficiency anemia Providers:                Napoleon Form, MD, Margaree Mackintosh, RN,                            Priscella Mann, Technician Referring MD:              Medicines:                Monitored Anesthesia Care Complications:            No immediate complications. Estimated Blood Loss:     Estimated blood loss was minimal. Procedure:                Pre-Anesthesia Assessment:                           - Prior to the procedure, a History and Physical                            was performed, and patient medications and                            allergies were reviewed. The patient's tolerance of                            previous anesthesia was also reviewed. The risks                            and benefits of the procedure and the sedation                            options and risks were discussed with the patient.                            All questions were answered, and informed consent                            was obtained. Prior Anticoagulants: The patient has                            taken no anticoagulant or antiplatelet agents. ASA                            Grade Assessment: III - A patient with severe  systemic disease. After reviewing the risks and                            benefits, the patient was deemed in satisfactory                            condition to undergo the procedure.                           After obtaining informed consent, the endoscope was                            passed under direct vision. Throughout the                             procedure, the patient's blood pressure, pulse, and                            oxygen saturations were monitored continuously. The                            [id number]-[id number] ([id number]) Olympus endoscope was introduced                            through the mouth, and advanced to the second part                            of duodenum. The upper GI endoscopy was                            accomplished without difficulty. The patient                            tolerated the procedure well. Scope In: Scope Out: Findings:      The Z-line was regular and was found 38 cm from the incisors.      No gross lesions were noted in the entire esophagus.      A 4 cm hiatal hernia was present.      Patchy mildly erythematous mucosa without bleeding was found in the       cardia, in the gastric antrum and in the prepyloric region of the       stomach. Biopsies were taken with a cold forceps for Helicobacter pylori       testing.      The cardia and gastric fundus were normal on retroflexion.      The examined duodenum was normal. Impression:               - Z-line regular, 38 cm from the incisors.                           - No gross lesions in the entire esophagus.                           - 4 cm hiatal hernia.                           -  Erythematous mucosa in the cardia, antrum and                            prepyloric region of the stomach. Biopsied.                           - Normal examined duodenum. Recommendation:           - Patient has a contact number available for                            emergencies. The signs and symptoms of potential                            delayed complications were discussed with the                            patient. Return to normal activities tomorrow.                            Written discharge instructions were provided to the                            patient.                           - Resume previous diet.                           -  Continue present medications.                           - See the other procedure note for documentation of                            additional recommendations. Procedure Code(s):        --- Professional ---                           [phone], Esophagogastroduodenoscopy, flexible,                            transoral; with biopsy, single or multiple Diagnosis Code(s):        --- Professional ---                           K44.9, Diaphragmatic hernia without obstruction or                            gangrene                           K31.89, Other diseases of stomach and duodenum                           D50.9, Iron deficiency anemia, unspecified CPT copyright 2022 [employer] Association. All rights reserved. The codes documented in this  report are preliminary and upon coder review may  be revised to meet current compliance requirements. Napoleon Form, MD 07/12/2023 2:40:32 PM This report has been signed electronically. Number of Addenda: 0

## 2023-07-13 LAB — CBC
HCT: 26.6 % — ABNORMAL LOW (ref 36.0–46.0)
Hemoglobin: 8.7 g/dL — ABNORMAL LOW (ref 12.0–15.0)
MCH: 31.2 pg (ref 26.0–34.0)
MCHC: 32.7 g/dL (ref 30.0–36.0)
MCV: 95.3 fL (ref 80.0–100.0)
Platelets: 591 10*3/uL — ABNORMAL HIGH (ref 150–400)
RBC: 2.79 MIL/uL — ABNORMAL LOW (ref 3.87–5.11)
RDW: 12.9 % (ref 11.5–15.5)
WBC: 7.5 10*3/uL (ref 4.0–10.5)
nRBC: 0 % (ref 0.0–0.2)

## 2023-07-13 LAB — BASIC METABOLIC PANEL
Anion gap: 7 (ref 5–15)
BUN: 18 mg/dL (ref 6–20)
CO2: 26 mmol/L (ref 22–32)
Calcium: 9 mg/dL (ref 8.9–10.3)
Chloride: 102 mmol/L (ref 98–111)
Creatinine, Ser: 1.25 mg/dL — ABNORMAL HIGH (ref 0.44–1.00)
GFR, Estimated: 49 mL/min — ABNORMAL LOW (ref >60)
Glucose, Bld: 110 mg/dL — ABNORMAL HIGH (ref 70–99)
Potassium: 3.7 mmol/L (ref 3.5–5.1)
Sodium: 135 mmol/L (ref 135–145)

## 2023-07-13 LAB — MAGNESIUM: Magnesium: 2 mg/dL (ref 1.7–2.4)

## 2023-07-13 MED ORDER — PANTOPRAZOLE SODIUM 40 MG PO TBEC
40.0000 mg | DELAYED_RELEASE_TABLET | Freq: Every day | ORAL | Status: DC
  Administered 2023-07-13 – 2023-07-17 (×5): 40 mg via ORAL
  Filled 2023-07-13 (×5): qty 1

## 2023-07-13 MED ORDER — OXYCODONE HCL 5 MG PO TABS
5.0000 mg | ORAL_TABLET | Freq: Once | ORAL | Status: AC
  Administered 2023-07-13: 5 mg via ORAL
  Filled 2023-07-13: qty 1

## 2023-07-13 MED ORDER — ASPIRIN 81 MG PO TBEC
81.0000 mg | DELAYED_RELEASE_TABLET | Freq: Every day | ORAL | Status: DC
Start: 2023-07-13 — End: 2023-07-17
  Administered 2023-07-13 – 2023-07-17 (×5): 81 mg via ORAL
  Filled 2023-07-13 (×5): qty 1

## 2023-07-13 MED ORDER — HEPARIN SODIUM (PORCINE) 5000 UNIT/ML IJ SOLN
5000.0000 [IU] | Freq: Three times a day (TID) | INTRAMUSCULAR | Status: DC
  Administered 2023-07-13 – 2023-07-17 (×11): 5000 [IU] via SUBCUTANEOUS
  Filled 2023-07-13 (×10): qty 1

## 2023-07-13 MED ORDER — OXYCODONE HCL 5 MG PO TABS
10.0000 mg | ORAL_TABLET | Freq: Four times a day (QID) | ORAL | Status: AC | PRN
  Administered 2023-07-13 – 2023-07-15 (×6): 10 mg via ORAL
  Filled 2023-07-13 (×7): qty 2

## 2023-07-13 NOTE — Progress Notes (Incomplete)
 Subjective: Jean [name] is a 58 y.o. F with pertinent PMH of HTN, sickle cell trait, GERD, osteoarthritis w/ recent total R knee replacement who presented to [facility] ED with severe R knee pain admitted for symptomatic anemia.   Today, patient is doing well and reported a good PT session yesterday. She did have some concerns of a "bump" in her R groin that she noticed last night. She denies current vaginal sores or lumps (apparently had one that has resolved) or changes in vaginal discharge.    Objective:  Vital signs in last 24 hours: Vitals:   07/13/23 1633 07/13/23 2013 07/14/23 0526 07/14/23 0917  BP: (!) 149/75 (!) 132/98 (!) 142/93 126/83  Pulse: 70 85 89 96  Resp: 18 15 18 19   Temp: 97.8 F (36.6 C) 98.4 F (36.9 C) 98.1 F (36.7 C) 97.8 F (36.6 C)  TempSrc: Oral Oral  Oral  SpO2: 99% 100% 98% 100%  Weight:      Height:       Physical Exam: General:NAD, sitting in bed  Cardiac:RRR, systolic murmur present on exam, RUSB Pulmonary:normal effort on room air  Neuro:awake, alert MSK:R knee has clean/dry dressing, no edema or erythema on the R knee appreciated, R groin lymphadenopathy with tenderness to palpate  Skin:warm and dry  Psych:  normal mood and affect      Latest Ref Rng & Units 07/14/2023    5:19 AM 07/13/2023    3:15 AM 07/12/2023    6:11 AM  CBC  WBC 4.0 - 10.5 K/uL 8.3  7.5  7.7   Hemoglobin 12.0 - 15.0 g/dL 8.9  8.7  8.6   Hematocrit 36.0 - 46.0 % 27.2  26.6  26.0   Platelets 150 - 400 K/uL 645  591  580        Latest Ref Rng & Units 07/14/2023    5:19 AM 07/13/2023    3:15 AM 07/12/2023    6:11 AM  CMP  Glucose 70 - 99 mg/dL 88  191  87   BUN 6 - 20 mg/dL 17  18  14    Creatinine 0.44 - 1.00 mg/dL 4.78  2.95  6.21   Sodium 135 - 145 mmol/L 139  135  135   Potassium 3.5 - 5.1 mmol/L 4.0  3.7  3.8   Chloride 98 - 111 mmol/L 103  102  104   CO2 22 - 32 mmol/L 25  26  24    Calcium 8.9 - 10.3 mg/dL 9.4  9.0  9.0   Total Protein 6.5 - 8.1 g/dL   6.6    Total Bilirubin 0.0 - 1.2 mg/dL   0.4   Alkaline Phos 38 - 126 U/L   52   AST 15 - 41 U/L   13   ALT 0 - 44 U/L   12      Assessment/Plan:  Principal Problem:   Symptomatic anemia Active Problems:   Chronic kidney disease, stage 3a (HCC)   History of cardioembolic cerebrovascular accident (CVA)   Status post total right knee replacement   Hemarthrosis   Melena   Occult blood in stools   Gastritis determined by endoscopy  Symptomatic anemia Iron Deficiency  (+) FOBT  Patient remains hemodynamically stable with a stable hemoglobin of 8.9. EGD and colonoscopy did not reveal source of bleed. Anemia is most likely multifactorial with contribution from hemarthrosis and possible GI bleed that has resolved/ present in the small bowel, with the positive FOBT and melenic  stool 3 weeks prior.  Plan: -Oral PPI 40mg  daily today for erythematous gastric mucosa  -Continue to avoid NSAIDs  -GI has signed off and is recommending CBC outpatient check in 1-2 weeks, repeat colonoscopy due to inadequate bowel prep, and outpatient follow up   -Medically stable for discharge pending placement  Lymphadenopathy, R groin  R groin lymphadenopathy palpated on exam with tenderness to palpation. Most likely due to reactive lymphadenopathy from recent R TKA. No infectious causes identified, R knee pain and edema have improved and she denies G/U sources of infection to cause the reactive lymphadenopathy.    S/P Right TKA Hemarthrosis   S/p total knee replacement 02/25. X-ray shows interval increase with no moderate joint effusion. Orthopedics were consulted who performed an aspiration of red blood consistent with a hematoma.  Plan: -Continue PT/OT    Elevated Serum Creatinine  Most likely due to decrease in oral intake and increase in stool output from bowel prep. Creatinine has improved very slightly from 1.25 to 1.23.  Plan: -Encourage oral intake today -BMP tomorrow morning   Hx CVA Resumed ASA  81mg   Continue lipitor   Mildly thickened and calcified aortic valve  There is minimally restricted aortic valve movement, patient is asymptomatic and HDS. Plan: -Continue to monitor    Thrombocytosis At this time suspect this is reactive in setting of acute blood loss anemia / iron deficiency . Peripheral smear is normal  Plan: -Trend CBC   HTN BP has been okay so far. Home regimen is listed as amlodipine 10 mg daily, metoprolol 12.5 mg BID, spironolactone 25 mg daily. She states she has been taking amlodipine only at home, however fill history is supportive of taking spironolactone only.  Plan: -Will hold on anti-hypertensive therapy given relative normotension, will continue regimen when clinically appropriate   Asthma COPD No evidence of acute exacerbation. Not compliant with home breathing treatments. Plan: -Continue home albuterol 2 puff q4h PRN wheezing, shortness of breath; plan to discuss outpatient maintenance regimen prior to discharge   Depression Plan: -Continue home sertraline 25 mg daily   Resolved Problems:  __________________________________  Code Status: Full  VTE Prophylaxis:Heparin 5,000 units TID  Diet:Regular diet IVF:N/A Barriers to Discharge: Placement  Dispo: Anticipated discharge in approximately 1 day(s).   Faith Rogue, DO 07/14/2023, 9:57 AM Pager: [phone] After 5pm on weekdays and 1pm on weekends: On Call pager [phone]

## 2023-07-13 NOTE — Evaluation (Signed)
 Physical Therapy Evaluation Patient Details Name: Jean [name] MRN: [id number] DOB: [birth year] Today's Date: 07/13/2023  History of Present Illness  Pt is 58 year old presented to [facility] on  07/11/23 for increasing rt knee pain after recent rt TKR on 07/30/23. She also reports fatigue and dizziness. Work up revealed anemia and +FOBT. Work-up on 3/7 was unable to locate a bleed. PMH - HTN, GERD, depression, MDD, COPD, THR, CVA, and asthma.  Clinical Impression  Pt admitted with above diagnosis.  Pt currently with functional limitations due to the deficits listed below (see PT Problem List). Pt will benefit from acute skilled PT to increase their independence and safety with mobility. Pt lives alone and  reports FTT at home. Patient will benefit from intensive inpatient follow-up therapy, >3 hours/day for a short term to regain independence.       If plan is discharge home, recommend the following: A little help with walking and/or transfers;A little help with bathing/dressing/bathroom;Assist for transportation;Help with stairs or ramp for entrance;Assistance with cooking/housework   Can travel by private vehicle        Equipment Recommendations None recommended by PT  Recommendations for Other Services       Functional Status Assessment Patient has had a recent decline in their functional status and demonstrates the ability to make significant improvements in function in a reasonable and predictable amount of time.     Precautions / Restrictions Precautions Precautions: Fall;Knee Recall of Precautions/Restrictions: Intact Restrictions Weight Bearing Restrictions Per Provider Order: Yes RLE Weight Bearing Per Provider Order: Weight bearing as tolerated      Mobility  Bed Mobility Overal bed mobility: Needs Assistance Bed Mobility: Supine to Sit, Sit to Supine     Supine to sit: Supervision Sit to supine: Supervision   General bed mobility comments: using UEs and LLE to move and  position RLE    Transfers Overall transfer level: Needs assistance Equipment used: Rolling walker (2 wheels) Transfers: Sit to/from Stand Sit to Stand: Contact guard assist           General transfer comment: cues for safest technique    Ambulation/Gait Ambulation/Gait assistance: Contact guard assist Gait Distance (Feet): 80 Feet Assistive device: Rolling walker (2 wheels) Gait Pattern/deviations: Step-to pattern, Decreased weight shift to right, Antalgic, Decreased stride length Gait velocity: reduced     General Gait Details: no balance losses. Pt reports being very fatigues after gait.  Stairs            Wheelchair Mobility     Tilt Bed    Modified Rankin (Stroke Patients Only)       Balance Overall balance assessment: Needs assistance         Standing balance support: Reliant on assistive device for balance, Bilateral upper extremity supported Standing balance-Leahy Scale: Poor                               Pertinent Vitals/Pain Pain Assessment Pain Assessment: 0-10 Pain Score: 7  Pain Location: R knee, lower R eg and r hip Pain Descriptors / Indicators: Sore, Grimacing, Guarding Pain Intervention(s): Limited activity within patient's tolerance, Monitored during session, Premedicated before session    Home Living Family/patient expects to be discharged to:: Private residence Living Arrangements: Alone Available Help at Discharge: Other (Comment) (Pt reports no help at home) Type of Home: House Home Access: Stairs to enter Entrance Stairs-Rails: Left Entrance Stairs-Number of Steps: 2  Home Layout: One level Home Equipment: Agricultural consultant (2 wheels);Rollator (4 wheels);Cane - single point Additional Comments: Home set-up taken form recent admission    Prior Function Prior Level of Function : Independent/Modified Independent             Mobility Comments: Pt reports she was not doing well at home with mobility but no  reported falls       Extremity/Trunk Assessment   Upper Extremity Assessment Upper Extremity Assessment: Overall WFL for tasks assessed    Lower Extremity Assessment Lower Extremity Assessment: RLE deficits/detail RLE Deficits / Details: weakness and limited ROM to knee    Cervical / Trunk Assessment Cervical / Trunk Assessment: Normal  Communication   Communication Communication: No apparent difficulties    Cognition Arousal: Alert Behavior During Therapy: WFL for tasks assessed/performed   PT - Cognitive impairments: No apparent impairments                         Following commands: Intact       Cueing       General Comments General comments (skin integrity, edema, etc.): Pt does not feel she can manage herslef at home and requesting rehab here.    Exercises Total Joint Exercises Ankle Circles/Pumps: AROM, Both, 10 reps, Supine Quad Sets: AROM, 10 reps, AAROM, Supine, Right Short Arc Quad: AAROM, Right, 10 reps, Supine Heel Slides: AAROM, Right, 10 reps, Supine Straight Leg Raises: AAROM, Right, 5 reps, Supine Goniometric ROM: ROM grossly 10-50 degrees on R knee.  Will formally measure next session   Assessment/Plan    PT Assessment Patient needs continued PT services  PT Problem List Decreased strength;Decreased range of motion;Decreased activity tolerance;Decreased balance;Decreased mobility;Decreased knowledge of use of DME;Pain       PT Treatment Interventions DME instruction;Gait training;Stair training;Therapeutic exercise;Patient/family education    PT Goals (Current goals can be found in the Care Plan section)  Acute Rehab PT Goals Patient Stated Goal: to go to rehab to get better before going home PT Goal Formulation: With patient Time For Goal Achievement: 07/27/23 Potential to Achieve Goals: Good    Frequency Min 3X/week     Co-evaluation               AM-PAC PT "6 Clicks" Mobility  Outcome Measure Help needed turning  from your back to your side while in a flat bed without using bedrails?: None Help needed moving from lying on your back to sitting on the side of a flat bed without using bedrails?: A Little Help needed moving to and from a bed to a chair (including a wheelchair)?: A Little Help needed standing up from a chair using your arms (e.g., wheelchair or bedside chair)?: A Little Help needed to walk in hospital room?: A Little Help needed climbing 3-5 steps with a railing? : A Little 6 Click Score: 19    End of Session Equipment Utilized During Treatment: Gait belt Activity Tolerance: Patient limited by pain;Patient tolerated treatment well Patient left: in bed;with call bell/phone within reach   PT Visit Diagnosis: Other abnormalities of gait and mobility (R26.89);Pain Pain - Right/Left: Right Pain - part of body: Knee    Time: 3086-5784 PT Time Calculation (min) (ACUTE ONLY): 38 min   Charges:   PT Evaluation $PT Eval Moderate Complexity: 1 Mod PT Treatments $Gait Training: 8-22 mins $Therapeutic Exercise: 8-22 mins PT General Charges $$ ACUTE PT VISIT: 1 Visit  Lavona Mound, PT   Acute Rehabilitation Services  Office [phone] 07/13/2023   Donnella Sham 07/13/2023, 12:00 PM

## 2023-07-13 NOTE — Progress Notes (Signed)
 Inpatient Rehab Admissions Coordinator Note:   Per PT patient was screened for CIR candidacy by Keaton Stirewalt Luvenia Starch, CCC-SLP. Discussed with Dr. Berline Chough. At this time pt is CGA with mobility. We will follow medical workup from a distance and then rescreen to determine if pt requires the intensity of CIR.    Wolfgang Phoenix, MS, CCC-SLP Admissions Coordinator [phone] 07/13/23 12:47 PM

## 2023-07-13 NOTE — Progress Notes (Signed)
 Subjective: Jean [name] is a 58 y.o. F with pertinent PMH of HTN, sickle cell trait, GERD, osteoarthritis w/ recent total R knee replacement who presented to [facility] ED with severe R knee pain admitted for symptomatic anemia.   Today, patient's knee pain is feeling better after the medications. She denies any blood in the stool but does report a nose bleed this morning.    Objective:  Vital signs in last 24 hours: Vitals:   07/12/23 1501 07/12/23 1710 07/12/23 2136 07/13/23 0624  BP: (!) 153/77 (!) 155/110 (!) 146/83 126/78  Pulse: 86 (!) 102 80 90  Resp:  18 18 18   Temp:   98.4 F (36.9 C) 98.3 F (36.8 C)  TempSrc:      SpO2: 98% 99% 100% 97%  Weight:      Height:       Physical Exam: General:NAD, resting in bed  Cardiac:RRR, systolic murmur present  Pulmonary:normal effort on room air  Abdominal:some mild tenderness to palpation present  Neuro:awake, alert, participating in conversation  MSK:R knee is not edematous, no tenderness to light palpation  Skin:warm and dry  Psych: normal mood and affect      Latest Ref Rng & Units 07/13/2023    3:15 AM 07/12/2023    6:11 AM 07/11/2023   10:52 AM  CBC  WBC 4.0 - 10.5 K/uL 7.5  7.7  8.6   Hemoglobin 12.0 - 15.0 g/dL 8.7  8.6  9.3   Hematocrit 36.0 - 46.0 % 26.6  26.0  29.3   Platelets 150 - 400 K/uL 591  580  598        Latest Ref Rng & Units 07/13/2023    3:15 AM 07/12/2023    6:11 AM 07/11/2023   10:52 AM  CMP  Glucose 70 - 99 mg/dL 161  87    BUN 6 - 20 mg/dL 18  14    Creatinine 0.96 - 1.00 mg/dL 0.45  4.09  8.11   Sodium 135 - 145 mmol/L 135  135    Potassium 3.5 - 5.1 mmol/L 3.7  3.8    Chloride 98 - 111 mmol/L 102  104    CO2 22 - 32 mmol/L 26  24    Calcium 8.9 - 10.3 mg/dL 9.0  9.0    Total Protein 6.5 - 8.1 g/dL  6.6    Total Bilirubin 0.0 - 1.2 mg/dL  0.4    Alkaline Phos 38 - 126 U/L  52    AST 15 - 41 U/L  13    ALT 0 - 44 U/L  12       Assessment/Plan:  Principal Problem:   Symptomatic  anemia Active Problems:   Chronic kidney disease, stage 3a (HCC)   History of cardioembolic cerebrovascular accident (CVA)   Status post total right knee replacement   Hemarthrosis   Melena   Occult blood in stools   Gastritis determined by endoscopy  Symptomatic anemia Iron Deficiency  (+) FOBT  Patient remains hemodynamically stable with a stable hemoglobin of 8.6. EGD and colonoscopy did not reveal source of bleed. Anemia is most likely multifactorial with contribution from hemarthrosis and possible GI bleed that has resolved/ present in the small bowel, with the positive FOBT and melenic stool 3 weeks prior.  Plan: -Oral PPI 40mg  today for erythematous gastric mucosa  -Continue to avoid NSAIDs  -Patient received IV iron yesterday for symptomatic anemia, currently 803mg  iron deficient  -Trend CBC, transfuse for Hgb <7  -  GI has signed off and is recommending CBC outpatient check in 1-2 weeks, repeat colonoscopy due to inadequate bowel prep, and outpatient follow up    S/P Right TKA Hemarthrosis   S/p total knee replacement 02/25. X-ray shows interval increase with no moderate joint effusion. Orthopedics were consulted who performed an aspiration of red blood consistent with a hematoma.  Plan: -Continue PT/OT   -Orthopedics following   Elevated Serum Creatinine  Most likely due to decrease in oral intake and increase in stool output from bowel prep Plan: -Encourage oral intake today -BMP tomorrow morning   Hx CVA Resumed ASA 81mg   Continue lipitor   Mildly thickened and calcified aortic valve  There is minimally restricted aortic valve movement, patient is asymptomatic and HDS. Plan: -Continue to monitor    Thrombocytosis At this time suspect this is reactive in setting of acute blood loss anemia / iron deficiency . Peripheral smear is normal  Plan: -Trend CBC   HTN BP has been okay so far. Home regimen is listed as amlodipine 10 mg daily, metoprolol 12.5 mg BID,  spironolactone 25 mg daily. She states she has been taking amlodipine only at home, however fill history is supportive of taking spironolactone only.  Plan: -Will hold on anti-hypertensive therapy given relative normotension, will continue regimen when clinically appropriate    Asthma COPD No evidence of acute exacerbation. Not compliant with home breathing treatments. Plan: -Continue home albuterol 2 puff q4h PRN wheezing, shortness of breath; plan to discuss outpatient maintenance regimen prior to discharge   Depression Plan: -Continue home sertraline 25 mg daily   Resolved Problems:  __________________________________  Code Status: Full  VTE Prophylaxis:SCDs Diet:Regular diet IVF:N/A Barriers to Discharge: Evaluation of anemia, possible   Dispo: Anticipated discharge in approximately 1 day(s).   Faith Rogue, DO 07/13/2023, 9:43 AM Pager: [phone] After 5pm on weekdays and 1pm on weekends: On Call pager [phone]

## 2023-07-14 ENCOUNTER — Encounter (HOSPITAL_COMMUNITY): Payer: Self-pay | Admitting: Gastroenterology

## 2023-07-14 DIAGNOSIS — Z96651 Presence of right artificial knee joint: Secondary | ICD-10-CM | POA: Diagnosis not present

## 2023-07-14 DIAGNOSIS — D649 Anemia, unspecified: Secondary | ICD-10-CM | POA: Diagnosis not present

## 2023-07-14 LAB — BASIC METABOLIC PANEL
Anion gap: 11 (ref 5–15)
BUN: 17 mg/dL (ref 6–20)
CO2: 25 mmol/L (ref 22–32)
Calcium: 9.4 mg/dL (ref 8.9–10.3)
Chloride: 103 mmol/L (ref 98–111)
Creatinine, Ser: 1.23 mg/dL — ABNORMAL HIGH (ref 0.44–1.00)
GFR, Estimated: 50 mL/min — ABNORMAL LOW (ref >60)
Glucose, Bld: 88 mg/dL (ref 70–99)
Potassium: 4 mmol/L (ref 3.5–5.1)
Sodium: 139 mmol/L (ref 135–145)

## 2023-07-14 LAB — CBC
HCT: 27.2 % — ABNORMAL LOW (ref 36.0–46.0)
Hemoglobin: 8.9 g/dL — ABNORMAL LOW (ref 12.0–15.0)
MCH: 31.7 pg (ref 26.0–34.0)
MCHC: 32.7 g/dL (ref 30.0–36.0)
MCV: 96.8 fL (ref 80.0–100.0)
Platelets: 645 10*3/uL — ABNORMAL HIGH (ref 150–400)
RBC: 2.81 MIL/uL — ABNORMAL LOW (ref 3.87–5.11)
RDW: 12.8 % (ref 11.5–15.5)
WBC: 8.3 10*3/uL (ref 4.0–10.5)
nRBC: 0 % (ref 0.0–0.2)

## 2023-07-14 MED ORDER — ORAL CARE MOUTH RINSE: 15.0000 mL | OROMUCOSAL | Status: DC | PRN

## 2023-07-14 NOTE — TOC Progression Note (Signed)
 Transition of Care [facility]) - Progression Note    Patient Details  Name: Jean [name] MRN: [id number] Date of Birth: [birth year]  Transition of Care [facility]) CM/SW Contact  Dellie Burns [place], [place] Phone Number: 07/14/2023, 12:21 PM  Clinical Narrative: Per CIR admissions, pt does not meet criteria for inpatient rehab. CM spoke with pt and reports plan is for return home with continued HH through [facility].  Dellie Burns, MSW, LCSW [phone] (coverage)             Expected Discharge Plan and Services                                               Social Determinants of Health (SDOH) Interventions SDOH Screenings   Food Insecurity: Food Insecurity Present (07/12/2023)  Housing: Low Risk  (07/12/2023)  Transportation Needs: Unmet Transportation Needs (07/12/2023)  Utilities: Not At Risk (07/11/2023)  Recent Concern: Utilities - At Risk (07/02/2023)  Alcohol Screen: Low Risk  (06/20/2023)  Depression (PHQ2-9): High Risk (06/05/2023)  Financial Resource Strain: High Risk (06/13/2023)  Social Connections: Socially Isolated (06/05/2023)  Stress: Stress Concern Present (06/13/2023)  Tobacco Use: Medium Risk (07/12/2023)  Health Literacy: Adequate Health Literacy (06/20/2023)    Readmission Risk Interventions     No data to display

## 2023-07-14 NOTE — Progress Notes (Signed)
 Mobility Specialist Progress Note:    07/14/23 0917  Therapy Vitals  Temp 97.8 F (36.6 C)  Temp Source Oral  Pulse Rate 96  Resp 19  BP 126/83  Patient Position (if appropriate) Lying  Oxygen Therapy  SpO2 100 %  Mobility  Activity Ambulated with assistance in hallway  Level of Assistance Contact guard assist, steadying assist  Assistive Device Front wheel walker  Distance Ambulated (ft) 80 ft  RLE Weight Bearing Per Provider Order WBAT  Activity Response Tolerated well  Mobility Referral Yes  Mobility visit 1 Mobility  Mobility Specialist Start Time (ACUTE ONLY) [id number]  Mobility Specialist Stop Time (ACUTE ONLY) [id number]  Mobility Specialist Time Calculation (min) (ACUTE ONLY) 14 min   Pt received in bed and agreeable. C/o R knee pain and stiffness. Otherwise asymptomatic. Required minG throughout w/ no LOB or buckling. Pt left in bed with RN and MD present.  D'Vante Earlene Plater Mobility Specialist Please contact via Special educational needs teacher or Rehab office at [phone]

## 2023-07-14 NOTE — Plan of Care (Signed)
  Problem: Clinical Measurements: Goal: Ability to maintain clinical measurements within normal limits will improve Outcome: Progressing   Problem: Clinical Measurements: Goal: Will remain free from infection Outcome: Progressing   Problem: Activity: Goal: Risk for activity intolerance will decrease Outcome: Progressing   Problem: Nutrition: Goal: Adequate nutrition will be maintained Outcome: Progressing   Problem: Coping: Goal: Level of anxiety will decrease Outcome: Progressing   Problem: Pain Managment: Goal: General experience of comfort will improve and/or be controlled Outcome: Progressing

## 2023-07-14 NOTE — TOC Progression Note (Addendum)
 Transition of Care [facility] - Progression Note    Patient Details  Name: Jean [name] MRN: [id number] Date of Birth: November 29, 1965  Transition of Care [facility]) CM/SW Contact  Ronny Bacon, RN Phone Number: 07/14/2023, 9:44 AM  Clinical Narrative:  Secure message received from CIR liaison, patient does not meet CIR criteria.   1125: Spoke with patient, discussed not being a candidate for CIR and unable to find [facility] agency in network with insurance. Patient given options of Outpatient physical and occupational therapy or SNF rehab. Patient preferred going to SNF rehab. Patient confirmed that she does not have anyone at home to help her. LCSW made aware.  1137:  Noted that patient has active [facility] PT service with [facility] per Heather. LCSW made aware.        Expected Discharge Plan and Services                                               Social Determinants of Health (SDOH) Interventions SDOH Screenings   Food Insecurity: Food Insecurity Present (07/12/2023)  Housing: Low Risk  (07/12/2023)  Transportation Needs: Unmet Transportation Needs (07/12/2023)  Utilities: Not At Risk (07/11/2023)  Recent Concern: Utilities - At Risk (07/02/2023)  Alcohol Screen: Low Risk  (06/20/2023)  Depression (PHQ2-9): High Risk (06/05/2023)  Financial Resource Strain: High Risk (06/13/2023)  Social Connections: Socially Isolated (06/05/2023)  Stress: Stress Concern Present (06/13/2023)  Tobacco Use: Medium Risk (07/12/2023)  Health Literacy: Adequate Health Literacy (06/20/2023)    Readmission Risk Interventions     No data to display

## 2023-07-14 NOTE — Progress Notes (Signed)
 Inpatient Rehab Admissions Coordinator:  Notified that pt is medically ready to discharge. Discussed pt with Dr. Berline Chough again. Pt does not meet CIR criteria with a diagnosis of TKR. Pt is CGA with mobility and has not required OT. TOC made aware.   Wolfgang Phoenix, MS, CCC-SLP Admissions Coordinator [phone]

## 2023-07-15 ENCOUNTER — Telehealth: Payer: Self-pay | Admitting: Student

## 2023-07-15 DIAGNOSIS — D649 Anemia, unspecified: Secondary | ICD-10-CM | POA: Diagnosis not present

## 2023-07-15 LAB — BASIC METABOLIC PANEL
Anion gap: 11 (ref 5–15)
BUN: 13 mg/dL (ref 6–20)
CO2: 27 mmol/L (ref 22–32)
Calcium: 9.7 mg/dL (ref 8.9–10.3)
Chloride: 100 mmol/L (ref 98–111)
Creatinine, Ser: 1.18 mg/dL — ABNORMAL HIGH (ref 0.44–1.00)
GFR, Estimated: 53 mL/min — ABNORMAL LOW (ref >60)
Glucose, Bld: 113 mg/dL — ABNORMAL HIGH (ref 70–99)
Potassium: 3.9 mmol/L (ref 3.5–5.1)
Sodium: 138 mmol/L (ref 135–145)

## 2023-07-15 LAB — CBC
HCT: 27.8 % — ABNORMAL LOW (ref 36.0–46.0)
Hemoglobin: 8.9 g/dL — ABNORMAL LOW (ref 12.0–15.0)
MCH: 30.9 pg (ref 26.0–34.0)
MCHC: 32 g/dL (ref 30.0–36.0)
MCV: 96.5 fL (ref 80.0–100.0)
Platelets: 654 10*3/uL — ABNORMAL HIGH (ref 150–400)
RBC: 2.88 MIL/uL — ABNORMAL LOW (ref 3.87–5.11)
RDW: 12.6 % (ref 11.5–15.5)
WBC: 8.9 10*3/uL (ref 4.0–10.5)
nRBC: 0 % (ref 0.0–0.2)

## 2023-07-15 MED ORDER — SPIRONOLACTONE 25 MG PO TABS
25.0000 mg | ORAL_TABLET | Freq: Every day | ORAL | Status: DC
  Administered 2023-07-15 – 2023-07-17 (×3): 25 mg via ORAL
  Filled 2023-07-15 (×3): qty 1

## 2023-07-15 MED ORDER — OXYCODONE HCL 5 MG PO TABS
10.0000 mg | ORAL_TABLET | Freq: Four times a day (QID) | ORAL | Status: DC | PRN
  Administered 2023-07-15 – 2023-07-17 (×6): 10 mg via ORAL
  Filled 2023-07-15 (×7): qty 2

## 2023-07-15 NOTE — Progress Notes (Signed)
 HD#0 SUBJECTIVE:  Patient Summary: Jean [name] is a 58 y.o. with a pertinent PMH of HTN, sickle cell trait, GERD, osteoarthritis w/ recent total R knee replacement who presented to [facility] ED with severe R knee pain admitted for symptomatic anemia.   Overnight Events: None   Interim History: Patient evaluated at bedside working with PT. She reports R knee pain radiating down her R foot. States that pain is worse with weight and mobility. Otherwise denies any CP/ SOB/ Abd pain.   OBJECTIVE:  Vital Signs: Vitals:   07/14/23 0917 07/14/23 1641 07/14/23 2025 07/15/23 0500  BP: 126/83 132/72 (!) 157/98 122/70  Pulse: 96 77 88 89  Resp: 19 20 18 18   Temp: 97.8 F (36.6 C) 98 F (36.7 C) 98.8 F (37.1 C) 98.7 F (37.1 C)  TempSrc: Oral Oral    SpO2: 100% 100% 100% 100%  Weight:      Height:       Supplemental O2: Room Air SpO2: 100 %  Filed Weights   07/11/23 0747  Weight: 61.7 kg     Intake/Output Summary (Last 24 hours) at 07/15/2023 1914 Last data filed at 07/15/2023 0600 Gross per 24 hour  Intake 1560 ml  Output 275 ml  Net 1285 ml   Net IO Since Admission: 3,365 mL [07/15/23 0646]  Physical Exam: Physical Exam  General: Sitting up in bed, no acute distress HEENT: Atraumatic, normocephalic Cardiovascular: Regular rate Pulmonary: Normal work of breathing, no wheezing or crackles Abdomen: Soft, nontender, nondistended MSK: Right lower extremity range of motion limited due to pain  Patient Lines/Drains/Airways Status     Active Line/Drains/Airways     Name Placement date Placement time Site Days   Peripheral IV 07/11/23 20 G Distal;Left;Posterior Forearm 07/11/23  0811  Forearm  4   Incision (Closed) 07/11/23 Knee Anterior;Right 07/11/23  2000  -- 4   Incision - 3 Ports Abdomen Umbilicus Right;Lower Lower;Left 02/25/18  1230  -- 1966             ASSESSMENT/PLAN:  Assessment: Principal Problem:   Symptomatic anemia Active Problems:   Chronic kidney  disease, stage 3a (HCC)   History of cardioembolic cerebrovascular accident (CVA)   Status post total right knee replacement   Hemarthrosis   Melena   Occult blood in stools   Gastritis determined by endoscopy   Plan: Symptomatic anemia Iron Deficiency  (+) FOBT  Patient remains hemodynamically stable with a stable hemoglobin of 8.9 (8.9). EGD and colonoscopy did not reveal source of bleed. Anemia is most likely multifactorial with contribution from hemarthrosis and possible GI bleed that has resolved/ present in the small bowel, with the positive FOBT and melenic stool 3 weeks prior.  -Oral PPI 40mg  daily today for erythematous gastric mucosa  -Continue to avoid NSAIDs  -GI has signed off and is recommending CBC outpatient check in 1-2 weeks, repeat colonoscopy due to inadequate bowel prep, and outpatient follow up   -Medically stable for discharge pending SNF placement   Lymphadenopathy, R groin  R groin lymphadenopathy palpated on exam with tenderness to palpation. Most likely due to reactive lymphadenopathy from recent R TKA. No infectious causes identified, R knee pain and edema have improved and she denies G/U sources of infection to cause the reactive lymphadenopathy.    S/P Right TKA Hemarthrosis   S/p total knee replacement 02/25. X-ray shows interval increase with no moderate joint effusion. Orthopedics were consulted who performed an aspiration of red blood consistent with a  hematoma.  Plan: -Continue PT/OT     Elevated Serum Creatinine  Most likely due to decrease in oral intake and increase in stool output from bowel prep.  - Creatinine has improved 1.18 (1.23) -Encourage oral intake today -BMP tomorrow morning    Hx CVA Resumed ASA 81mg   Continue lipitor    Mildly thickened and calcified aortic valve  There is minimally restricted aortic valve movement, patient is asymptomatic and HDS. Plan: -Continue to monitor    Thrombocytosis At this time suspect this is  reactive in setting of acute blood loss anemia / iron deficiency.  - Peripheral smear is normal  -Trend CBC   HTN Home regimen is listed as amlodipine 10 mg daily, metoprolol 12.5 mg BID, spironolactone 25 mg daily. She states she has been taking amlodipine only at home, however fill history is supportive of taking spironolactone only.  - Will start with spironolactone today    Asthma COPD No evidence of acute exacerbation. Not compliant with home breathing treatments. -Continue home albuterol 2 puff q4h PRN wheezing, shortness of breath;    Depression -Continue home sertraline 25 mg daily  Best Practice: Diet: Regular diet IVF: Fluids: None, Rate: None VTE: heparin injection 5,000 Units Start: 07/13/23 1700 Place TED hose Start: 07/13/23 1656 Code: Full AB: None Therapy Recs: SNF, DME: none DISPO: Anticipated discharge  1-2 days  to Skilled nursing facility pending  facility .  Signature: Jeral Pinch, D.O.  Internal Medicine Resident, PGY-1 [facility]  Pager: [phone] 6:46 AM, 07/15/2023   Please contact the on call pager after 5 pm and on weekends at [phone].

## 2023-07-15 NOTE — Telephone Encounter (Signed)
 Copied from CRM [id number]. Topic: General - Other >> Jul 15, 2023  3:51 PM Dominique A wrote: Reason for CRM: Patient is currently in the hospital and was told to call her PCP for an order to be placed for her to have a Nurses Aid. Patient is not for sure of her discharge date but was told that it could be any day. Patient also states that she was waiting to hear back to see if she need to go to rehab. Please call patient on her mobile phone to discuss.  [phone]  Spoke with the pt who is currently inpatient.  Pt verbalized understanding that until she is D/c form the hospital , [facility] will not be able to provide her PCS services or her Rehab.  The pt is assigned a Care manager Nurse whil she is inpatient and will ask about these services upon  her D/C.

## 2023-07-15 NOTE — Progress Notes (Signed)
 Mobility Specialist Progress Note:    07/15/23 1221  Mobility  Activity Ambulated with assistance in hallway  Level of Assistance Contact guard assist, steadying assist  Assistive Device Front wheel walker  Distance Ambulated (ft) 50 ft  RLE Weight Bearing Per Provider Order WBAT  Activity Response Tolerated well  Mobility Referral Yes  Mobility visit 1 Mobility  Mobility Specialist Start Time (ACUTE ONLY) 0931  Mobility Specialist Stop Time (ACUTE ONLY) 0945  Mobility Specialist Time Calculation (min) (ACUTE ONLY) 14 min   Pt received in bed agreeable to mobility. Pt c/o R knee pain, otherwise no c/o. Distance limited d/t said pain. Returned to room w/o fault. Left in bed. Call bell and personal belongings in reach. All needs met. NT aware.  Thompson Grayer Mobility Specialist  Please contact vis Secure Chat or  Rehab Office [phone]

## 2023-07-15 NOTE — Progress Notes (Signed)
 Physical Therapy Treatment Patient Details Name: Jean [name] MRN: [id number] DOB: August 24, 1965 Today's Date: 07/15/2023   History of Present Illness Pt is 58 year old presented to [facility] on  07/11/23 for increasing rt knee pain after recent rt TKR on 07/02/23. She also reports fatigue and dizziness. Work up revealed anemia and +FOBT. Work-up on 3/7 was unable to locate a bleed. PMH - HTN, GERD, depression, MDD, COPD, THR, CVA, and asthma.    PT Comments  Pt progressing well with R knee flexion but lacking ~20 deg knee ext. Because of this her RLE is falling into hip ER in bed which is aggravating ankle discomfort and not stretching R knee into extension. Taught pt how to position RLE with ankle on blankets and pillow on lateral side of R knee to keep knee in alignment and allow gravity to stretch knee into extension. This lack in knee extension is also affecting her balance and her gait pattern which seems to be further aggravating R ankle. Worked on sitting knee ext as well as standing knee ext. Do not feel that pt will be safe at home at this point and really needs consistent knee rehab and education. Patient will benefit from continued inpatient follow up therapy, <3 hours/day. PT will continue to follow.     If plan is discharge home, recommend the following: A little help with walking and/or transfers;A little help with bathing/dressing/bathroom;Assist for transportation;Help with stairs or ramp for entrance;Assistance with cooking/housework   Can travel by private vehicle     Yes  Equipment Recommendations  None recommended by PT    Recommendations for Other Services       Precautions / Restrictions Precautions Precautions: Fall;Knee Precaution Booklet Issued: Yes (comment) Recall of Precautions/Restrictions: Intact Precaution/Restrictions Comments: gave pt a handout of TKA exercises and educated again on positioning to promote extension as pt lacking at least 20 deg  extension Restrictions Weight Bearing Restrictions Per Provider Order: Yes RLE Weight Bearing Per Provider Order: Weight bearing as tolerated     Mobility  Bed Mobility Overal bed mobility: Needs Assistance Bed Mobility: Supine to Sit, Sit to Supine     Supine to sit: Modified independent (Device/Increase time) Sit to supine: Modified independent (Device/Increase time)   General bed mobility comments: pt able to sit straight up into long sitting and then pivot to EOB but uses LLE to assist RLE    Transfers Overall transfer level: Needs assistance Equipment used: Rolling walker (2 wheels) Transfers: Sit to/from Stand Sit to Stand: Contact guard assist           General transfer comment: needed vc;s for hand placement though she had been educated on this before    Ambulation/Gait Ambulation/Gait assistance: Contact guard assist Gait Distance (Feet): 20 Feet Assistive device: Rolling walker (2 wheels) Gait Pattern/deviations: Step-to pattern, Decreased weight shift to right, Antalgic, Decreased stride length, Knee flexed in stance - right Gait velocity: reduced Gait velocity interpretation: <1.8 ft/sec, indicate of risk for recurrent falls Pre-gait activities: pushing R heel towards floor and extending R knee General Gait Details: R heel off floor since pt cannot fully extend R knee which is likely contributing to her ankle discomfort. Worked on knee extension in stance phase and wt shifting to [employer]     Tilt Bed    Modified Rankin (Stroke Patients Only)       Balance Overall balance assessment:  Needs assistance Sitting-balance support: No upper extremity supported, Feet supported Sitting balance-Leahy Scale: Good     Standing balance support: Reliant on assistive device for balance, Bilateral upper extremity supported Standing balance-Leahy Scale: Poor Standing balance comment: unsteady in standing as pt can't get  RLE flat on floor                            Communication Communication Communication: No apparent difficulties  Cognition Arousal: Alert Behavior During Therapy: Anxious, Impulsive   PT - Cognitive impairments: No family/caregiver present to determine baseline                       PT - Cognition Comments: pt very anxious, reported she felt like she would cry after MD's left the room. Is having chest discomfort associated with her anxiety. She relayed this to doctors. Needed reeducation of things previously taught Following commands: Intact      Cueing Cueing Techniques: Verbal cues  Exercises Total Joint Exercises Ankle Circles/Pumps: AROM, Both, 10 reps, Supine Quad Sets: AROM, AAROM, Supine, Right, Both, 20 reps Hip ABduction/ADduction: Right, Supine, 10 reps, AROM Straight Leg Raises: Right, Supine, 10 reps, AROM Long Arc Quad: AROM, Right, 10 reps, AAROM, Seated Knee Flexion: AAROM, Right, 5 reps, Seated Goniometric ROM: 20-80 deg    General Comments General comments (skin integrity, edema, etc.): reviewed positioning with ankle on blanket bolster and pillow on lateral side of R knee for gravity to pull R knee into extension. Due to extension that is lacking she is tending to fall into hip ER which is also aggravating R ankle      Pertinent Vitals/Pain Pain Assessment Pain Assessment: Faces Faces Pain Scale: Hurts whole lot Pain Location: R knee and ankle Pain Descriptors / Indicators: Sore, Grimacing, Guarding Pain Intervention(s): Limited activity within patient's tolerance, Monitored during session    Home Living                          Prior Function            PT Goals (current goals can now be found in the care plan section) Acute Rehab PT Goals Patient Stated Goal: to go to rehab to get better before going home PT Goal Formulation: With patient Time For Goal Achievement: 07/27/23 Potential to Achieve Goals: Good Progress  towards PT goals: Progressing toward goals    Frequency    Min 3X/week      PT Plan      Co-evaluation              AM-PAC PT "6 Clicks" Mobility   Outcome Measure  Help needed turning from your back to your side while in a flat bed without using bedrails?: None Help needed moving from lying on your back to sitting on the side of a flat bed without using bedrails?: A Little Help needed moving to and from a bed to a chair (including a wheelchair)?: A Little Help needed standing up from a chair using your arms (e.g., wheelchair or bedside chair)?: A Little Help needed to walk in hospital room?: A Lot Help needed climbing 3-5 steps with a railing? : A Lot 6 Click Score: 17    End of Session   Activity Tolerance: Patient limited by pain Patient left: in bed;with call bell/phone within reach Nurse Communication: Mobility status PT Visit Diagnosis: Other abnormalities of gait and  mobility (R26.89);Pain Pain - Right/Left: Right Pain - part of body: Knee     Time: 1134-1203 PT Time Calculation (min) (ACUTE ONLY): 29 min  Charges:    $Therapeutic Exercise: 8-22 mins $Therapeutic Activity: 8-22 mins PT General Charges $$ ACUTE PT VISIT: 1 Visit                     Lyanne Co, PT  Acute Rehab Services Secure chat preferred Office [phone]    Lawana Chambers Sakeena Teall 07/15/2023, 12:20 PM

## 2023-07-15 NOTE — TOC Progression Note (Signed)
 Transition of Care [facility]) - Progression Note    Patient Details  Name: Jean [name] MRN: [id number] Date of Birth: 12-12-1965  Transition of Care [facility]) CM/SW Contact  Tom-Johnson, Hershal Coria, RN Phone Number: 07/15/2023, 3:46 PM  Clinical Narrative:     CM spoke with patient at bedside about discharge disposition. Patient requested to go to SNF instead of home with home health.   LCSW to follow with SNF referrals.       Expected Discharge Plan and Services                                               Social Determinants of Health (SDOH) Interventions SDOH Screenings   Food Insecurity: Food Insecurity Present (07/12/2023)  Housing: Low Risk  (07/12/2023)  Transportation Needs: Unmet Transportation Needs (07/12/2023)  Utilities: Not At Risk (07/11/2023)  Recent Concern: Utilities - At Risk (07/02/2023)  Alcohol Screen: Low Risk  (06/20/2023)  Depression (PHQ2-9): High Risk (06/05/2023)  Financial Resource Strain: High Risk (06/13/2023)  Social Connections: Socially Isolated (06/05/2023)  Stress: Stress Concern Present (06/13/2023)  Tobacco Use: Medium Risk (07/12/2023)  Health Literacy: Adequate Health Literacy (06/20/2023)    Readmission Risk Interventions     No data to display

## 2023-07-15 NOTE — NC FL2 (Signed)
 [place] MEDICAID FL2 LEVEL OF CARE FORM     IDENTIFICATION  Patient Name: Jean [name] Birthdate: 12/20/1965 Sex: female Admission Date (Current Location): 07/11/2023  [facility] and [place] Number:  Producer, television/film/video and Address:  [facility] [place], [address], [place], [place] [address]      Provider Number: [id number]  Attending Physician Name and Address:  Gust Rung, DO  Relative Name and Phone Number:       Current Level of Care: Hospital Recommended Level of Care: Skilled Nursing Facility Prior Approval Number:    Date Approved/Denied:   PASRR Number: Pending  Discharge Plan: SNF    Current Diagnoses: Patient Active Problem List   Diagnosis Date Noted   Hemarthrosis 07/12/2023   Melena 07/12/2023   Occult blood in stools 07/12/2023   Gastritis determined by endoscopy 07/12/2023   Symptomatic anemia 07/11/2023   Status post total right knee replacement 07/02/2023   Osteoarthritis of right knee 06/05/2023   Food insecurity 06/05/2023   Bilateral shoulder pain 11/29/2022   Dysphagia 11/29/2022   History of cardioembolic cerebrovascular accident (CVA) 11/29/2022   Dizziness 06/19/2022   Constipation 05/09/2021   Weight loss 05/09/2021   Headache    Symptomatic bradycardia 04/18/2021   Near syncope 04/18/2021   Synovitis of left knee 07/26/2020   COVID-19 virus infection 05/11/2020   Low back pain 11/24/2019   Impingement syndrome of right shoulder 04/22/2019   History of dental surgery 02/05/2019   COPD GOLD 0 ? 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   GERD (gastroesophageal reflux disease)    Breast pain 06/23/2018   Dyspareunia in female 09/13/2017   Cervicalgia 04/24/2017   Chronic kidney disease, stage 3a (HCC) 10/29/2016   Chronic pain syndrome 05/10/2016   MDD (major depressive disorder) 10/19/2015   Dyspnea on exertion 08/22/2015   HTN (hypertension) 07/08/2015    Routine health maintenance 07/08/2015    Orientation RESPIRATION BLADDER Height & Weight     Self, Time, Situation, Place  Normal Continent Weight: 136 lb (61.7 kg) Height:  5\' 4"  (162.6 cm)  BEHAVIORAL SYMPTOMS/MOOD NEUROLOGICAL BOWEL NUTRITION STATUS      Continent Diet (See DC summary)  AMBULATORY STATUS COMMUNICATION OF NEEDS Skin   Limited Assist Verbally Surgical wounds (R knee incision)                       Personal Care Assistance Level of Assistance  Bathing, Feeding, Dressing Bathing Assistance: Limited assistance Feeding assistance: Independent Dressing Assistance: Limited assistance     Functional Limitations Info  Sight, Hearing, Speech Sight Info: Adequate Hearing Info: Adequate Speech Info: Adequate    SPECIAL CARE FACTORS FREQUENCY  PT (By licensed PT), OT (By licensed OT)     PT Frequency: 5x week OT Frequency: 5x week            Contractures Contractures Info: Not present    Additional Factors Info  Code Status, Allergies, Psychotropic Code Status Info: FUll Allergies Info: Lisinopril Psychotropic Info: Sertraline         Current Medications (07/15/2023):  This is the current hospital active medication list Current Facility-Administered Medications  Medication Dose Route Frequency Provider Last Rate Last Admin   acetaminophen (TYLENOL) tablet 650 mg  650 mg Oral Q6H PRN Champ Mungo, DO   650 mg at 07/15/23 1618   Or   acetaminophen (TYLENOL) suppository 650 mg  650  mg Rectal Q6H PRN Champ Mungo, DO       albuterol (PROVENTIL) (2.5 MG/3ML) 0.083% nebulizer solution 3 mL  3 mL Inhalation Q4H PRN Champ Mungo, DO       aspirin EC tablet 81 mg  81 mg Oral Daily Faith Rogue, DO   81 mg at 07/15/23 1032   atorvastatin (LIPITOR) tablet 40 mg  40 mg Oral Daily Champ Mungo, DO   40 mg at 07/15/23 1032   cyclobenzaprine (FLEXERIL) tablet 10 mg  10 mg Oral TID PRN Champ Mungo, DO   10 mg at 07/15/23 1612   gabapentin (NEURONTIN) capsule 300 mg   300 mg Oral QHS Champ Mungo, DO   300 mg at 07/14/23 2050   heparin injection 5,000 Units  5,000 Units Subcutaneous Q8H Champ Mungo, DO   5,000 Units at 07/15/23 1613   Oral care mouth rinse  15 mL Mouth Rinse PRN Dickie La, MD       pantoprazole (PROTONIX) EC tablet 40 mg  40 mg Oral Daily Faith Rogue, DO   40 mg at 07/15/23 1032   polyethylene glycol (MIRALAX / GLYCOLAX) packet 17 g  17 g Oral Daily PRN Champ Mungo, DO       sertraline (ZOLOFT) tablet 25 mg  25 mg Oral Daily Champ Mungo, DO   25 mg at 07/15/23 1032   sodium chloride flush (NS) 0.9 % injection 3-10 mL  3-10 mL Intravenous Q12H Champ Mungo, DO   10 mL at 07/15/23 1035   sodium chloride flush (NS) 0.9 % injection 3-10 mL  3-10 mL Intravenous PRN Champ Mungo, DO       spironolactone (ALDACTONE) tablet 25 mg  25 mg Oral Daily Tawkaliyar, Roya, DO   25 mg at 07/15/23 1032     Discharge Medications: Please see discharge summary for a list of discharge medications.  Relevant Imaging Results:  Relevant Lab Results:   Additional Information SS# [id number] [place]

## 2023-07-15 NOTE — Anesthesia Postprocedure Evaluation (Signed)
 Anesthesia Post Note  Patient: Jean [name]  Procedure(s) Performed: EGD (ESOPHAGOGASTRODUODENOSCOPY) COLONOSCOPY     Patient location during evaluation: Endoscopy Anesthesia Type: MAC Level of consciousness: awake and alert Pain management: pain level controlled Vital Signs Assessment: post-procedure vital signs reviewed and stable Respiratory status: spontaneous breathing, nonlabored ventilation, respiratory function stable and patient connected to nasal cannula oxygen Cardiovascular status: stable and blood pressure returned to baseline Postop Assessment: no apparent nausea or vomiting Anesthetic complications: no   No notable events documented.  Last Vitals:  Vitals:   07/15/23 1700 07/15/23 2114  BP:  (!) 172/94  Pulse:  80  Resp: 18 18  Temp:  36.8 C  SpO2:  99%    Last Pain:  Vitals:   07/15/23 2114  TempSrc: Oral  PainSc:                  Carrel Leather S

## 2023-07-15 NOTE — Plan of Care (Signed)
  Problem: Health Behavior/Discharge Planning: Goal: Ability to manage health-related needs will improve Outcome: Progressing   Problem: Clinical Measurements: Goal: Will remain free from infection Outcome: Progressing Goal: Diagnostic test results will improve Outcome: Progressing Goal: Respiratory complications will improve Outcome: Progressing Goal: Cardiovascular complication will be avoided Outcome: Progressing   Problem: Activity: Goal: Risk for activity intolerance will decrease Outcome: Progressing   Problem: Nutrition: Goal: Adequate nutrition will be maintained Outcome: Progressing   Problem: Coping: Goal: Level of anxiety will decrease Outcome: Progressing   Problem: Elimination: Goal: Will not experience complications related to bowel motility Outcome: Progressing Goal: Will not experience complications related to urinary retention Outcome: Progressing   Problem: Pain Managment: Goal: General experience of comfort will improve and/or be controlled Outcome: Progressing   Problem: Safety: Goal: Ability to remain free from injury will improve Outcome: Progressing   Problem: Skin Integrity: Goal: Risk for impaired skin integrity will decrease Outcome: Progressing   Problem: Education: Goal: Required Educational Video(s) Outcome: Progressing   Problem: Clinical Measurements: Goal: Ability to maintain clinical measurements within normal limits will improve Outcome: Progressing Goal: Postoperative complications will be avoided or minimized Outcome: Progressing   Problem: Skin Integrity: Goal: Demonstration of wound healing without infection will improve Outcome: Progressing

## 2023-07-15 NOTE — Social Work (Signed)
 Please be advised that the above-named patient will require a short-term nursing home stay-anticipated 30 days or less for rehabilitation and strengthening. The plan is for return home.

## 2023-07-16 ENCOUNTER — Other Ambulatory Visit (HOSPITAL_COMMUNITY): Payer: Self-pay

## 2023-07-16 ENCOUNTER — Encounter: Payer: Medicaid Other | Admitting: Physician Assistant

## 2023-07-16 ENCOUNTER — Other Ambulatory Visit: Payer: Self-pay | Admitting: Cardiovascular Disease

## 2023-07-16 DIAGNOSIS — Z79899 Other long term (current) drug therapy: Secondary | ICD-10-CM

## 2023-07-16 DIAGNOSIS — D649 Anemia, unspecified: Secondary | ICD-10-CM | POA: Diagnosis not present

## 2023-07-16 DIAGNOSIS — I10 Essential (primary) hypertension: Secondary | ICD-10-CM

## 2023-07-16 DIAGNOSIS — R002 Palpitations: Secondary | ICD-10-CM

## 2023-07-16 LAB — BASIC METABOLIC PANEL
Anion gap: 10 (ref 5–15)
BUN: 14 mg/dL (ref 6–20)
CO2: 28 mmol/L (ref 22–32)
Calcium: 9.9 mg/dL (ref 8.9–10.3)
Chloride: 100 mmol/L (ref 98–111)
Creatinine, Ser: 1.22 mg/dL — ABNORMAL HIGH (ref 0.44–1.00)
GFR, Estimated: 51 mL/min — ABNORMAL LOW (ref >60)
Glucose, Bld: 84 mg/dL (ref 70–99)
Potassium: 4.2 mmol/L (ref 3.5–5.1)
Sodium: 138 mmol/L (ref 135–145)

## 2023-07-16 LAB — CBC
HCT: 29.1 % — ABNORMAL LOW (ref 36.0–46.0)
Hemoglobin: 9.2 g/dL — ABNORMAL LOW (ref 12.0–15.0)
MCH: 30.9 pg (ref 26.0–34.0)
MCHC: 31.6 g/dL (ref 30.0–36.0)
MCV: 97.7 fL (ref 80.0–100.0)
Platelets: 670 10*3/uL — ABNORMAL HIGH (ref 150–400)
RBC: 2.98 MIL/uL — ABNORMAL LOW (ref 3.87–5.11)
RDW: 12.7 % (ref 11.5–15.5)
WBC: 8.6 10*3/uL (ref 4.0–10.5)
nRBC: 0 % (ref 0.0–0.2)

## 2023-07-16 MED ORDER — OXYCODONE HCL 10 MG PO TABS
10.0000 mg | ORAL_TABLET | Freq: Four times a day (QID) | ORAL | 0 refills | Status: DC | PRN
  Filled 2023-07-16: qty 12, 3d supply, fill #0

## 2023-07-16 NOTE — Discharge Summary (Incomplete Revision)
 Name: Jean [name] MRN: [id number] DOB: 24-Jun-1965 58 y.o. PCP: Jean James, DO  Date of Admission: 07/11/2023  7:44 AM Date of Discharge:  07/11/2023 Attending Physician: Dr. Cleda Daub  DISCHARGE DIAGNOSIS:  Primary Problem: Symptomatic anemia   Hospital Problems: Principal Problem:   Symptomatic anemia Active Problems:   Chronic kidney disease, stage 3a (HCC)   History of cardioembolic cerebrovascular accident (CVA)   Status post total right knee replacement   Hemarthrosis   Melena   Occult blood in stools   Gastritis determined by endoscopy    DISCHARGE MEDICATIONS:   Allergies as of 07/16/2023       Reactions   Lisinopril Swelling, Other (See Comments)   Lip swelling, patient reported - unable to confirm from chart         Medication List     STOP taking these medications    amLODipine 10 MG tablet Commonly known as: NORVASC   budesonide-formoterol 160-4.5 MCG/ACT inhaler Commonly known as: Symbicort   ibuprofen 200 MG tablet Commonly known as: ADVIL   metoprolol tartrate 25 MG tablet Commonly known as: LOPRESSOR       TAKE these medications    acetaminophen 500 MG tablet Commonly known as: TYLENOL Take 1,000 mg by mouth every 6 (six) hours as needed for moderate pain (pain score 4-6).   albuterol 108 (90 Base) MCG/ACT inhaler Commonly known as: Ventolin HFA Inhale 2 puffs into the lungs every 4 (four) hours as needed for wheezing or shortness of breath.   aspirin 81 MG chewable tablet Chew 1 tablet (81 mg total) by mouth 2 (two) times daily.   atorvastatin 40 MG tablet Commonly known as: Lipitor Take 1 tablet (40 mg total) by mouth daily.   cyclobenzaprine 10 MG tablet Commonly known as: FLEXERIL Take 1 tablet (10 mg total) by mouth 3 (three) times daily as needed for muscle spasms. TAKE 1 TABLET BY MOUTH TWICE A DAY AS NEEDED FOR MUSCLE SPASMS What changed: additional instructions   gabapentin 300 MG capsule Commonly known  as: NEURONTIN TAKE 1 CAPSULE BY MOUTH EVERYDAY AT BEDTIME What changed:  how much to take how to take this when to take this additional instructions   Oxycodone HCl 10 MG Tabs Take 1 tablet (10 mg total) by mouth every 6 (six) hours as needed for up to 3 days for severe pain (pain score 7-10). What changed:  medication strength how much to take reasons to take this   pantoprazole 40 MG tablet Commonly known as: PROTONIX Take 1 tablet (40 mg total) by mouth daily.   sertraline 25 MG tablet Commonly known as: ZOLOFT TAKE 1 TABLET (25 MG TOTAL) BY MOUTH DAILY.   spironolactone 25 MG tablet Commonly known as: ALDACTONE Take 1 tablet (25 mg total) by mouth daily.        DISPOSITION AND FOLLOW-UP:  [name] was discharged from [facility] in Good condition. At the hospital follow up visit please address:  Hemarthrosis S/p R TKA 2/25 with symptomatic anemia: EGD and colonoscopy 3/7 did not reveal source of bleed. Showed erythematous gastric mucosa. Discharged on oral PPI 40 mg daily. GI wants to follow up outpatient for repeat colonoscopy due to inadequate bowel prep. Please make sure she has colonoscopy set up. Also, Please make sure you repeat Hgb and PLT. She did get IV iron infusion on 3/7.  Thrombocytosis: Likely reactive to underlying anemia and recent surgery. Hgb stable. Please recheck PLT, if continued to be  elevated, recommend viral studies such as Hep panel and RVP.  Mildly thickened and calcified aortic valve: Is stable without any symptoms.  Asthma/COPD: Apparently she is only taking Albuterol inhaler, not the Symbicort. Unclear, she has a history of medication noncompliance.  Depression: discharged with home medication sertraline 25 mg HTN: Discharged on Spironolactone 25 mg. She is not taking amlodipine or lopressor per dispense history. Restart home meds as BP tolerates   She has a history of medication noncompliance.   Follow-up  Recommendations: Consults: None Labs: Basic Metabolic Profile and CBC Studies: None Medications: Oxy 10 mg q6h prn for 3 days. Spironolactone 25 mg daily.   Follow-up Appointments:  Follow-up Information     [facility] Follow up.   Why: Someone will call you to schedule resumption of care order. Contact information: [address] [place] [address] [phone]         Jean James, DO Follow up on 07/23/2023.   Specialty: Internal Medicine Why: AT 8:45 AM for hospital follow up Contact information: [address] [place] [address] [phone]                 HOSPITAL COURSE:   Symptomatic anemia Iron Deficiency  (+) FOBT  S/p total knee replacement 02/25, incidentally noted to have downtrending Hgb on labs while being assessed for her knee pain. FOBT positive as well. She has never had a colonoscopy. EGD and colonoscopy did not reveal source of bleed. Anemia is most likely multifactorial with contribution from hemarthrosis and possible GI bleed that has resolved/ present in the small bowel, with the positive FOBT and melenic stool 3 weeks prior. GI has signed off and is recommending CBC outpatient check in 1-2 weeks, repeat colonoscopy due to inadequate bowel prep, and outpatient follow up. She did get IV iron infusion here due to low saturation ratio. Otherwise, hgb is stable.   S/P Right TKA Hemarthrosis   S/p total knee replacement 02/25. X-ray shows interval increase with no moderate joint effusion. Orthopedics were consulted who performed an aspiration of red blood consistent with a hematoma. She has been working with PT/OT here with improvement. Pain is controlled with tylenol and oxy.    Elevated Serum Creatinine  Most likely due to decrease in oral intake and increase in stool output from bowel prep. Back to baseline.    Hx CVA Resumed home ASA 81mg  and statin.     Mildly thickened and calcified aortic  valve  There is minimally restricted aortic valve movement, patient is asymptomatic and HDS.   Thrombocytosis At this time suspect this is reactive in setting of acute blood loss anemia / iron deficiency. Peripheral smear is normal.    HTN BP has been okay so far. Home regimen is listed as amlodipine 10 mg daily, metoprolol 12.5 mg BID, spironolactone 25 mg daily. She states she has been taking amlodipine only at home, however fill history is supportive of taking spironolactone only. We started her on Spironolactone here with plans to follow up OP with PCP.    Asthma COPD No evidence of acute exacerbation. Not compliant with home breathing treatments. Continued home albuterol 2 puff q4h PRN wheezing, shortness of breath. Unclear if she is taking Symbicort, advised to follow up OP with PCP.    Depression Continued home sertraline 25 mg daily   DISCHARGE INSTRUCTIONS:   Discharge Instructions     Diet - low sodium heart healthy   Complete by: As directed  Discharge instructions   Complete by: As directed    Ms. Mikylah, Ackroyd came to the hospital for R knee pain and swelling. We treated you with pain medications and removed the fluids from your R knee.   For your R knee Pain  - You can take Tylenol 1000 mg every 6 hours as needed for pain, make sure you do not exceed 4000 mg in 24 hours.  - You can take Oxycodone 10 mg, one tablet by mouth every six hours as needed for severe pain. I have only given you a three day course of it.   For your blood pressure - Continue taking Spironolactone 25 mg daily  Talk to your primary care doctor before you start taking amlodipine and metoprolol.   For your Anemia - Follow up with your primary care doctor to talk about over the counter supplements for iron deficiency - DO NOT TAKE ANY IBUPROFEN, GOODY POWDER, ALEVE. These will increase your risk for bleed.   Otherwise, take the rest of your medications as prescribed.   If you have any of  these following symptoms, please call us or seek care at an emergency department: -Chest Pain -Difficulty Breathing -Worsening abdominal pain -Syncope (passing out) -Drooping of face -Slurred speech -Sudden weakness in your leg or arm -Fever -Chills -blood in the stool -dark black, sticky stool  If you have any questions or concerns, call our clinic at [phone] or after hours call [phone] and ask for the internal medicine resident on call.  I am glad you are feeling better. It was a pleasure taking care for you. I wish a good recovery and good health!   Dr. Jeral Pinch       SUBJECTIVE:  Patient is evaluated at bedside. Reports that she is doing well. Has some itching around the area of the dressing on her R knee. Reports some R knee discomfort. Otherwise, no concern. She reports her daughter is here in town and is planning to stay for a week to take care of her.   Discharge Vitals:   BP 137/79 (BP Location: Left Arm)   Pulse 91   Temp 99.4 F (37.4 C) (Oral)   Resp 18   Ht 5\' 4"  (1.626 m)   Wt 61.7 kg   LMP  (LMP Unknown)   SpO2 100%   BMI 23.34 kg/m   OBJECTIVE:  Physical Exam   General: Sitting beside by bed, no acute distress  HEENT: Atraumatic, normocephalic Cardiovascular: Regular rate Pulmonary: Normal work of breathing, no wheezing or crackles Abdomen: Soft, nontender, nondistended MSK: ROM intact, RLE with minor limitation due to pain. No new swelling or erythema.  Pertinent Labs, Studies, and Procedures:     Latest Ref Rng & Units 07/16/2023    3:25 AM 07/15/2023    3:45 AM 07/14/2023    5:19 AM  CBC  WBC 4.0 - 10.5 K/uL 8.6  8.9  8.3   Hemoglobin 12.0 - 15.0 g/dL 9.2  8.9  8.9   Hematocrit 36.0 - 46.0 % 29.1  27.8  27.2   Platelets 150 - 400 K/uL 670  654  645        Latest Ref Rng & Units 07/16/2023    3:25 AM 07/15/2023    3:45 AM 07/14/2023    5:19 AM  CMP  Glucose 70 - 99 mg/dL 84  034  88   BUN 6 - 20 mg/dL 14  13  17     Creatinine 0.44 -  1.00 mg/dL 1.61  0.96  0.45   Sodium 135 - 145 mmol/L 138  138  139   Potassium 3.5 - 5.1 mmol/L 4.2  3.9  4.0   Chloride 98 - 111 mmol/L 100  100  103   CO2 22 - 32 mmol/L 28  27  25    Calcium 8.9 - 10.3 mg/dL 9.9  9.7  9.4     ECHOCARDIOGRAM COMPLETE Result Date: 07/11/2023    ECHOCARDIOGRAM REPORT   Patient Name:   HALIEGH KHURANA Date of Exam: 07/11/2023 Medical Rec #:  [id number]        Height:       64.0 in Accession #:    [id number]       Weight:       136.0 lb Date of Birth:  April 02, 1966        BSA:          1.661 m Patient Age:    58 years         BP:           159/93 mmHg Patient Gender: F                HR:           78 bpm. Exam Location:  Inpatient Procedure: 2D Echo, Cardiac Doppler, Color Doppler and Strain Analysis (Both            Spectral and Color Flow Doppler were utilized during procedure). Indications:    Murmur R01.1  History:        Patient has prior history of Echocardiogram examinations, most                 recent 04/19/2021. COPD, CKD, stage 3 and Stroke,                 Arrythmias:Bradycardia, Signs/Symptoms:Syncope and Dyspnea; Risk                 Factors:Hypertension.  Sonographer:    Lucendia Herrlich RCS Referring Phys: [id number] CAROLYN GUILLOUD IMPRESSIONS  1. Left ventricular ejection fraction, by estimation, is 65 to 70%. The left ventricle has normal function. The left ventricle has no regional wall motion abnormalities. There is mild left ventricular hypertrophy. Left ventricular diastolic parameters were normal.  2. Right ventricular systolic function is normal. The right ventricular size is normal.  3. Mild mitral valve regurgitation.  4. The left and non coronary cusps of AV are mildly thickened, calcified. There is minimally restricted motion of the valve . The aortic valve is tricuspid. Aortic valve regurgitation is not visualized.  5. The inferior vena cava is normal in size with greater than 50% respiratory variability, suggesting right atrial  pressure of 3 mmHg. FINDINGS  Left Ventricle: Left ventricular ejection fraction, by estimation, is 65 to 70%. The left ventricle has normal function. The left ventricle has no regional wall motion abnormalities. Global longitudinal strain performed but not reported based on interpreter judgement due to suboptimal tracking. The left ventricular internal cavity size was normal in size. There is mild left ventricular hypertrophy. Left ventricular diastolic parameters were normal. Right Ventricle: The right ventricular size is normal. Right vetricular wall thickness was not assessed. Right ventricular systolic function is normal. Left Atrium: Left atrial size was normal in size. Right Atrium: Right atrial size was normal in size. Pericardium: Trivial pericardial effusion is present. Mitral Valve: There is mild thickening of the mitral valve leaflet(s). Mild mitral valve regurgitation. Tricuspid Valve: The tricuspid  valve is normal in structure. Tricuspid valve regurgitation is mild. Aortic Valve: The left and non coronary cusps of AV are mildly thickened, calcified. There is minimally restricted motion of the valve. The aortic valve is tricuspid. Aortic valve regurgitation is not visualized. Aortic valve mean gradient measures 6.0 mmHg. Aortic valve peak gradient measures 12.0 mmHg. Aortic valve area, by VTI measures 2.06 cm. Pulmonic Valve: The pulmonic valve was normal in structure. Pulmonic valve regurgitation is not visualized. Aorta: The aortic root and ascending aorta are structurally normal, with no evidence of dilitation. Venous: The inferior vena cava is normal in size with greater than 50% respiratory variability, suggesting right atrial pressure of 3 mmHg. IAS/Shunts: No atrial level shunt detected by color flow Doppler.  LEFT VENTRICLE PLAX 2D LVIDd:         4.20 cm   Diastology LVIDs:         2.60 cm   LV e' medial:    7.62 cm/s LV PW:         0.80 cm   LV E/e' medial:  9.7 LV IVS:        1.00 cm   LV e'  lateral:   10.90 cm/s LVOT diam:     2.00 cm   LV E/e' lateral: 6.8 LV SV:         67 LV SV Index:   40 LVOT Area:     3.14 cm  RIGHT VENTRICLE             IVC RV S prime:     10.10 cm/s  IVC diam: 1.20 cm TAPSE (M-mode): 2.0 cm LEFT ATRIUM             Index        RIGHT ATRIUM           Index LA diam:        3.10 cm 1.87 cm/m   RA Area:     10.12 cm LA Vol (A2C):   39.0 ml 23.49 ml/m  RA Volume:   18.35 ml  11.05 ml/m LA Vol (A4C):   41.2 ml 24.81 ml/m LA Biplane Vol: 43.0 ml 25.89 ml/m  AORTIC VALVE AV Area (Vmax):    2.14 cm AV Area (Vmean):   2.06 cm AV Area (VTI):     2.06 cm AV Vmax:           173.33 cm/s AV Vmean:          115.000 cm/s AV VTI:            0.327 m AV Peak Grad:      12.0 mmHg AV Mean Grad:      6.0 mmHg LVOT Vmax:         118.00 cm/s LVOT Vmean:        75.300 cm/s LVOT VTI:          0.214 m LVOT/AV VTI ratio: 0.65  AORTA Ao Root diam: 3.00 cm Ao Asc diam:  3.40 cm MITRAL VALVE               TRICUSPID VALVE MV Area (PHT): 4.21 cm    TR Peak grad:   21.3 mmHg MV Decel Time: 180 msec    TR Vmax:        231.00 cm/s MV E velocity: 73.90 cm/s MV A velocity: 66.20 cm/s  SHUNTS MV E/A ratio:  1.12        Systemic VTI:  0.21 m  Systemic Diam: 2.00 cm Dietrich Pates MD Electronically signed by Dietrich Pates MD Signature Date/Time: 07/11/2023/6:02:22 PM    Final    DG Knee Complete 4 Views Right Result Date: 07/11/2023 CLINICAL DATA:  Recent surgery.  Worsening right knee pain. EXAM: RIGHT KNEE - COMPLETE 4+ VIEW COMPARISON:  Right knee radiographs 07/02/2023, 02/27/2023 FINDINGS: Redemonstration of total right knee arthroplasty. There again anterior surgical skin staples. No perihardware lucency is seen to indicate hardware failure or loosening. There is an increase in now moderate joint effusion. Interval decrease in persistent bubbles of intra-articular air. Resolution of the prior anterior distal thigh subcutaneous air. No acute fracture or dislocation. IMPRESSION: 1.  Total right knee arthroplasty without evidence of hardware failure. 2. Interval increase in now moderate joint effusion. Interval decrease in intra-articular air. This may represent resolving postsurgical intra-articular air. It is difficult to exclude infection of the joint fluid. Recommend clinical correlation. Electronically Signed   By: Neita Garnet M.D.   On: 07/11/2023 08:34     Signed: Jeral Pinch, D.O.  Internal Medicine Resident, PGY-1 [facility]  Pager: [phone] 4:17 PM, 07/16/2023

## 2023-07-16 NOTE — Discharge Summary (Cosign Needed)
 Name: Jean [name] MRN: [id number] DOB: 24-Jun-1965 58 y.o. PCP: [name], DO  Date of Admission: 07/11/2023  7:44 AM Date of Discharge:  07/11/2023 Attending Physician: Dr. Cleda Daub  DISCHARGE DIAGNOSIS:  Primary Problem: Symptomatic anemia   Hospital Problems: Principal Problem:   Symptomatic anemia Active Problems:   Chronic kidney disease, stage 3a (HCC)   History of cardioembolic cerebrovascular accident (CVA)   Status post total right knee replacement   Hemarthrosis   Melena   Occult blood in stools   Gastritis determined by endoscopy    DISCHARGE MEDICATIONS:   Allergies as of 07/16/2023       Reactions   Lisinopril Swelling, Other (See Comments)   Lip swelling, patient reported - unable to confirm from chart         Medication List     STOP taking these medications    amLODipine 10 MG tablet Commonly known as: NORVASC   budesonide-formoterol 160-4.5 MCG/ACT inhaler Commonly known as: Symbicort   ibuprofen 200 MG tablet Commonly known as: ADVIL   metoprolol tartrate 25 MG tablet Commonly known as: LOPRESSOR       TAKE these medications    acetaminophen 500 MG tablet Commonly known as: TYLENOL Take 1,000 mg by mouth every 6 (six) hours as needed for moderate pain (pain score 4-6).   albuterol 108 (90 Base) MCG/ACT inhaler Commonly known as: Ventolin HFA Inhale 2 puffs into the lungs every 4 (four) hours as needed for wheezing or shortness of breath.   aspirin 81 MG chewable tablet Chew 1 tablet (81 mg total) by mouth 2 (two) times daily.   atorvastatin 40 MG tablet Commonly known as: Lipitor Take 1 tablet (40 mg total) by mouth daily.   cyclobenzaprine 10 MG tablet Commonly known as: FLEXERIL Take 1 tablet (10 mg total) by mouth 3 (three) times daily as needed for muscle spasms. TAKE 1 TABLET BY MOUTH TWICE A DAY AS NEEDED FOR MUSCLE SPASMS What changed: additional instructions   gabapentin 300 MG capsule Commonly known  as: NEURONTIN TAKE 1 CAPSULE BY MOUTH EVERYDAY AT BEDTIME What changed:  how much to take how to take this when to take this additional instructions   Oxycodone HCl 10 MG Tabs Take 1 tablet (10 mg total) by mouth every 6 (six) hours as needed for up to 3 days for severe pain (pain score 7-10). What changed:  medication strength how much to take reasons to take this   pantoprazole 40 MG tablet Commonly known as: PROTONIX Take 1 tablet (40 mg total) by mouth daily.   sertraline 25 MG tablet Commonly known as: ZOLOFT TAKE 1 TABLET (25 MG TOTAL) BY MOUTH DAILY.   spironolactone 25 MG tablet Commonly known as: ALDACTONE Take 1 tablet (25 mg total) by mouth daily.        DISPOSITION AND FOLLOW-UP:  Jean [name] was discharged from [facility] in Good condition. At the hospital follow up visit please address:  Hemarthrosis S/p R TKA 2/25 with symptomatic anemia: EGD and colonoscopy 3/7 did not reveal source of bleed. Showed erythematous gastric mucosa. Discharged on oral PPI 40 mg daily. GI wants to follow up outpatient for repeat colonoscopy due to inadequate bowel prep. Please make sure she has colonoscopy set up. Also, Please make sure you repeat Hgb and PLT. She did get IV iron infusion on 3/7.  Thrombocytosis: Likely reactive to underlying anemia and recent surgery. Hgb stable. Please recheck PLT, if continued to be  elevated, recommend viral studies such as Hep panel and RVP.  Mildly thickened and calcified aortic valve: Is stable without any symptoms.  Asthma/COPD: Apparently she is only taking Albuterol inhaler, not the Symbicort. Unclear, she has a history of medication noncompliance.  Depression: discharged with home medication sertraline 25 mg HTN: Discharged on Spironolactone 25 mg. She is not taking amlodipine or lopressor per dispense history. Restart home meds as BP tolerates   She has a history of medication noncompliance.   Follow-up  Recommendations: Consults: None Labs: Basic Metabolic Profile and CBC Studies: None Medications: Oxy 10 mg q6h prn for 3 days. Spironolactone 25 mg daily.   Follow-up Appointments:  Follow-up Information     [facility], [facility] Follow up.   Why: Someone will call you to schedule resumption of care order. Contact information: [address] [place] [address] [phone]         [name], DO Follow up on 07/23/2023.   Specialty: Internal Medicine Why: AT 8:45 AM for hospital follow up Contact information: [address] [place] [address] [phone]                 HOSPITAL COURSE:   Symptomatic anemia Iron Deficiency  (+) FOBT  S/p total knee replacement 02/25, incidentally noted to have downtrending Hgb on labs while being assessed for her knee pain. FOBT positive as well. She has never had a colonoscopy. EGD and colonoscopy did not reveal source of bleed. Anemia is most likely multifactorial with contribution from hemarthrosis and possible GI bleed that has resolved/ present in the small bowel, with the positive FOBT and melenic stool 3 weeks prior. GI has signed off and is recommending CBC outpatient check in 1-2 weeks, repeat colonoscopy due to inadequate bowel prep, and outpatient follow up. She did get IV iron infusion here due to low saturation ratio. Otherwise, hgb is stable.   S/P Right TKA Hemarthrosis   S/p total knee replacement 02/25. X-ray shows interval increase with no moderate joint effusion. Orthopedics were consulted who performed an aspiration of red blood consistent with a hematoma. She has been working with PT/OT here with improvement. Pain is controlled with tylenol and oxy.    Elevated Serum Creatinine  Most likely due to decrease in oral intake and increase in stool output from bowel prep. Back to baseline.    Hx CVA Resumed home ASA 81mg  and statin.     Mildly thickened and calcified aortic  valve  There is minimally restricted aortic valve movement, patient is asymptomatic and HDS.   Thrombocytosis At this time suspect this is reactive in setting of acute blood loss anemia / iron deficiency. Peripheral smear is normal.    HTN BP has been okay so far. Home regimen is listed as amlodipine 10 mg daily, metoprolol 12.5 mg BID, spironolactone 25 mg daily. She states she has been taking amlodipine only at home, however fill history is supportive of taking spironolactone only. We started her on Spironolactone here with plans to follow up OP with PCP.    Asthma COPD No evidence of acute exacerbation. Not compliant with home breathing treatments. Continued home albuterol 2 puff q4h PRN wheezing, shortness of breath. Unclear if she is taking Symbicort, advised to follow up OP with PCP.    Depression Continued home sertraline 25 mg daily   DISCHARGE INSTRUCTIONS:   Discharge Instructions     Diet - low sodium heart healthy   Complete by: As directed  Discharge instructions   Complete by: As directed    Jean [name], Jean [name] came to the hospital for R knee pain and swelling. We treated you with pain medications and removed the fluids from your R knee.   For your R knee Pain  - You can take Tylenol 1000 mg every 6 hours as needed for pain, make sure you do not exceed 4000 mg in 24 hours.  - You can take Oxycodone 10 mg, one tablet by mouth every six hours as needed for severe pain. I have only given you a three day course of it.   For your blood pressure - Continue taking Spironolactone 25 mg daily  Talk to your primary care doctor before you start taking amlodipine and metoprolol.   For your Anemia - Follow up with your primary care doctor to talk about over the counter supplements for iron deficiency - DO NOT TAKE ANY IBUPROFEN, GOODY POWDER, ALEVE. These will increase your risk for bleed.   Otherwise, take the rest of your medications as prescribed.   If you have any of  these following symptoms, please call us or seek care at an emergency department: -Chest Pain -Difficulty Breathing -Worsening abdominal pain -Syncope (passing out) -Drooping of face -Slurred speech -Sudden weakness in your leg or arm -Fever -Chills -blood in the stool -dark black, sticky stool  If you have any questions or concerns, call our clinic at [phone] or after hours call [phone] and ask for the internal medicine resident on call.  I am glad you are feeling better. It was a pleasure taking care for you. I wish a good recovery and good health!   Dr. Jeral Pinch       SUBJECTIVE:  Patient is evaluated at bedside. Reports that she is doing well. Has some itching around the area of the dressing on her R knee. Reports some R knee discomfort. Otherwise, no concern. She reports her daughter is here in town and is planning to stay for a week to take care of her.   Discharge Vitals:   BP 137/79 (BP Location: Left Arm)   Pulse 91   Temp 99.4 F (37.4 C) (Oral)   Resp 18   Ht 5\' 4"  (1.626 m)   Wt 61.7 kg   LMP  (LMP Unknown)   SpO2 100%   BMI 23.34 kg/m   OBJECTIVE:  Physical Exam   General: Sitting beside by bed, no acute distress  HEENT: Atraumatic, normocephalic Cardiovascular: Regular rate Pulmonary: Normal work of breathing, no wheezing or crackles Abdomen: Soft, nontender, nondistended MSK: ROM intact, RLE with minor limitation due to pain. No new swelling or erythema.  Pertinent Labs, Studies, and Procedures:     Latest Ref Rng & Units 07/16/2023    3:25 AM 07/15/2023    3:45 AM 07/14/2023    5:19 AM  CBC  WBC 4.0 - 10.5 K/uL 8.6  8.9  8.3   Hemoglobin 12.0 - 15.0 g/dL 9.2  8.9  8.9   Hematocrit 36.0 - 46.0 % 29.1  27.8  27.2   Platelets 150 - 400 K/uL 670  654  645        Latest Ref Rng & Units 07/16/2023    3:25 AM 07/15/2023    3:45 AM 07/14/2023    5:19 AM  CMP  Glucose 70 - 99 mg/dL 84  034  88   BUN 6 - 20 mg/dL 14  13  17     Creatinine 0.44 -  1.00 mg/dL 1.61  0.96  0.45   Sodium 135 - 145 mmol/L 138  138  139   Potassium 3.5 - 5.1 mmol/L 4.2  3.9  4.0   Chloride 98 - 111 mmol/L 100  100  103   CO2 22 - 32 mmol/L 28  27  25    Calcium 8.9 - 10.3 mg/dL 9.9  9.7  9.4     ECHOCARDIOGRAM COMPLETE Result Date: 07/11/2023    ECHOCARDIOGRAM REPORT   Patient Name:   HALIEGH KHURANA Date of Exam: 07/11/2023 Medical Rec #:  [id number]        Height:       64.0 in Accession #:    [id number]       Weight:       136.0 lb Date of Birth:  April 02, 1966        BSA:          1.661 m Patient Age:    58 years         BP:           159/93 mmHg Patient Gender: F                HR:           78 bpm. Exam Location:  Inpatient Procedure: 2D Echo, Cardiac Doppler, Color Doppler and Strain Analysis (Both            Spectral and Color Flow Doppler were utilized during procedure). Indications:    Murmur R01.1  History:        Patient has prior history of Echocardiogram examinations, most                 recent 04/19/2021. COPD, CKD, stage 3 and Stroke,                 Arrythmias:Bradycardia, Signs/Symptoms:Syncope and Dyspnea; Risk                 Factors:Hypertension.  Sonographer:    Lucendia Herrlich RCS Referring Phys: [id number] CAROLYN GUILLOUD IMPRESSIONS  1. Left ventricular ejection fraction, by estimation, is 65 to 70%. The left ventricle has normal function. The left ventricle has no regional wall motion abnormalities. There is mild left ventricular hypertrophy. Left ventricular diastolic parameters were normal.  2. Right ventricular systolic function is normal. The right ventricular size is normal.  3. Mild mitral valve regurgitation.  4. The left and non coronary cusps of AV are mildly thickened, calcified. There is minimally restricted motion of the valve . The aortic valve is tricuspid. Aortic valve regurgitation is not visualized.  5. The inferior vena cava is normal in size with greater than 50% respiratory variability, suggesting right atrial  pressure of 3 mmHg. FINDINGS  Left Ventricle: Left ventricular ejection fraction, by estimation, is 65 to 70%. The left ventricle has normal function. The left ventricle has no regional wall motion abnormalities. Global longitudinal strain performed but not reported based on interpreter judgement due to suboptimal tracking. The left ventricular internal cavity size was normal in size. There is mild left ventricular hypertrophy. Left ventricular diastolic parameters were normal. Right Ventricle: The right ventricular size is normal. Right vetricular wall thickness was not assessed. Right ventricular systolic function is normal. Left Atrium: Left atrial size was normal in size. Right Atrium: Right atrial size was normal in size. Pericardium: Trivial pericardial effusion is present. Mitral Valve: There is mild thickening of the mitral valve leaflet(s). Mild mitral valve regurgitation. Tricuspid Valve: The tricuspid  valve is normal in structure. Tricuspid valve regurgitation is mild. Aortic Valve: The left and non coronary cusps of AV are mildly thickened, calcified. There is minimally restricted motion of the valve. The aortic valve is tricuspid. Aortic valve regurgitation is not visualized. Aortic valve mean gradient measures 6.0 mmHg. Aortic valve peak gradient measures 12.0 mmHg. Aortic valve area, by VTI measures 2.06 cm. Pulmonic Valve: The pulmonic valve was normal in structure. Pulmonic valve regurgitation is not visualized. Aorta: The aortic root and ascending aorta are structurally normal, with no evidence of dilitation. Venous: The inferior vena cava is normal in size with greater than 50% respiratory variability, suggesting right atrial pressure of 3 mmHg. IAS/Shunts: No atrial level shunt detected by color flow Doppler.  LEFT VENTRICLE PLAX 2D LVIDd:         4.20 cm   Diastology LVIDs:         2.60 cm   LV e' medial:    7.62 cm/s LV PW:         0.80 cm   LV E/e' medial:  9.7 LV IVS:        1.00 cm   LV e'  lateral:   10.90 cm/s LVOT diam:     2.00 cm   LV E/e' lateral: 6.8 LV SV:         67 LV SV Index:   40 LVOT Area:     3.14 cm  RIGHT VENTRICLE             IVC RV S prime:     10.10 cm/s  IVC diam: 1.20 cm TAPSE (M-mode): 2.0 cm LEFT ATRIUM             Index        RIGHT ATRIUM           Index LA diam:        3.10 cm 1.87 cm/m   RA Area:     10.12 cm LA Vol (A2C):   39.0 ml 23.49 ml/m  RA Volume:   18.35 ml  11.05 ml/m LA Vol (A4C):   41.2 ml 24.81 ml/m LA Biplane Vol: 43.0 ml 25.89 ml/m  AORTIC VALVE AV Area (Vmax):    2.14 cm AV Area (Vmean):   2.06 cm AV Area (VTI):     2.06 cm AV Vmax:           173.33 cm/s AV Vmean:          115.000 cm/s AV VTI:            0.327 m AV Peak Grad:      12.0 mmHg AV Mean Grad:      6.0 mmHg LVOT Vmax:         118.00 cm/s LVOT Vmean:        75.300 cm/s LVOT VTI:          0.214 m LVOT/AV VTI ratio: 0.65  AORTA Ao Root diam: 3.00 cm Ao Asc diam:  3.40 cm MITRAL VALVE               TRICUSPID VALVE MV Area (PHT): 4.21 cm    TR Peak grad:   21.3 mmHg MV Decel Time: 180 msec    TR Vmax:        231.00 cm/s MV E velocity: 73.90 cm/s MV A velocity: 66.20 cm/s  SHUNTS MV E/A ratio:  1.12        Systemic VTI:  0.21 m  Systemic Diam: 2.00 cm Dietrich Pates MD Electronically signed by Dietrich Pates MD Signature Date/Time: 07/11/2023/6:02:22 PM    Final    DG Knee Complete 4 Views Right Result Date: 07/11/2023 CLINICAL DATA:  Recent surgery.  Worsening right knee pain. EXAM: RIGHT KNEE - COMPLETE 4+ VIEW COMPARISON:  Right knee radiographs 07/02/2023, 02/27/2023 FINDINGS: Redemonstration of total right knee arthroplasty. There again anterior surgical skin staples. No perihardware lucency is seen to indicate hardware failure or loosening. There is an increase in now moderate joint effusion. Interval decrease in persistent bubbles of intra-articular air. Resolution of the prior anterior distal thigh subcutaneous air. No acute fracture or dislocation. IMPRESSION: 1.  Total right knee arthroplasty without evidence of hardware failure. 2. Interval increase in now moderate joint effusion. Interval decrease in intra-articular air. This may represent resolving postsurgical intra-articular air. It is difficult to exclude infection of the joint fluid. Recommend clinical correlation. Electronically Signed   By: Neita Garnet M.D.   On: 07/11/2023 08:34     Signed: Jeral Pinch, D.O.  Internal Medicine Resident, PGY-1 [facility]  Pager: [phone] 4:17 PM, 07/16/2023

## 2023-07-16 NOTE — TOC Progression Note (Addendum)
 Transition of Care [facility]) - Progression Note    Patient Details  Name: Jean [name] MRN: [id number] Date of Birth: [birth year]  Transition of Care [facility] CM/SW Contact  Carley Hammed, LCSW Phone Number: 07/16/2023, 11:57 AM  Clinical Narrative:    CSW noting pt currently does not have any bed offers. CSW followed up with [facility], who is considering. [facility] would require an LOG from the hospital. TOC will continue to follow.   2:10 [facility] docs uploaded.  2:30 [facility] Approved [id number] E 07/16/2023 to 08/15/2023 Lacinda Axon accepted in hub, message placed to them to confirm bed offer and insurance. TOC will continue to follow.    3:30 CSW spoke with pt who stated she does not want to go to [place], and wants to return home. Her dtr is coming from out of town and can help. CSW consulted CM and notified medical team. Per CM pt is active with [facility] and has no additional DME needs. Per pt, she cannot DC until her staples removed, MD notified of concerns.   Expected Discharge Plan and Services                                               Social Determinants of Health (SDOH) Interventions SDOH Screenings   Food Insecurity: Food Insecurity Present (07/12/2023)  Housing: Low Risk  (07/12/2023)  Transportation Needs: Unmet Transportation Needs (07/12/2023)  Utilities: Not At Risk (07/11/2023)  Recent Concern: Utilities - At Risk (07/02/2023)  Alcohol Screen: Low Risk  (06/20/2023)  Depression (PHQ2-9): High Risk (06/05/2023)  Financial Resource Strain: High Risk (06/13/2023)  Social Connections: Socially Isolated (06/05/2023)  Stress: Stress Concern Present (06/13/2023)  Tobacco Use: Medium Risk (07/12/2023)  Health Literacy: Adequate Health Literacy (06/20/2023)    Readmission Risk Interventions     No data to display

## 2023-07-16 NOTE — Discharge Instructions (Addendum)
 Jean [name],   You came to the hospital for R knee pain and swelling. We treated you with pain medications and removed the fluids from your R knee.   For your R knee Pain  - You can take Tylenol 1000 mg every 6 hours as needed for pain, make sure you do not exceed 4000 mg in 24 hours.  - You can take Oxycodone 10 mg, one tablet by mouth every six hours as needed for severe pain. I have only given you a three day course of it.   For your blood pressure - Continue taking Spironolactone 25 mg daily  Talk to your primary care doctor before you start taking amlodipine and metoprolol.   For your Anemia - Follow up with your primary care doctor to talk about over the counter supplements for iron deficiency - DO NOT TAKE ANY IBUPROFEN, GOODY POWDER, ALEVE. These will increase your risk for bleed.   Otherwise, take the rest of your medications as prescribed.   If you have any of these following symptoms, please call us or seek care at an emergency department: -Chest Pain -Difficulty Breathing -Worsening abdominal pain -Syncope (passing out) -Drooping of face -Slurred speech -Sudden weakness in your leg or arm -Fever -Chills -blood in the stool -dark black, sticky stool  If you have any questions or concerns, call our clinic at [phone] or after hours call [phone] and ask for the internal medicine resident on call.  I am glad you are feeling better. It was a pleasure taking care for you. I wish a good recovery and good health!   Dr. Jeral Pinch

## 2023-07-16 NOTE — Plan of Care (Signed)
  Problem: Health Behavior/Discharge Planning: Goal: Ability to manage health-related needs will improve Outcome: Progressing   Problem: Clinical Measurements: Goal: Will remain free from infection Outcome: Progressing Goal: Diagnostic test results will improve Outcome: Progressing Goal: Respiratory complications will improve Outcome: Progressing Goal: Cardiovascular complication will be avoided Outcome: Progressing   Problem: Activity: Goal: Risk for activity intolerance will decrease Outcome: Progressing   Problem: Nutrition: Goal: Adequate nutrition will be maintained Outcome: Progressing   Problem: Coping: Goal: Level of anxiety will decrease Outcome: Progressing   Problem: Elimination: Goal: Will not experience complications related to bowel motility Outcome: Progressing Goal: Will not experience complications related to urinary retention Outcome: Progressing   Problem: Pain Managment: Goal: General experience of comfort will improve and/or be controlled Outcome: Progressing   Problem: Safety: Goal: Ability to remain free from injury will improve Outcome: Progressing   Problem: Skin Integrity: Goal: Risk for impaired skin integrity will decrease Outcome: Progressing   Problem: Education: Goal: Required Educational Video(s) Outcome: Progressing   Problem: Clinical Measurements: Goal: Ability to maintain clinical measurements within normal limits will improve Outcome: Progressing Goal: Postoperative complications will be avoided or minimized Outcome: Progressing   Problem: Skin Integrity: Goal: Demonstration of wound healing without infection will improve Outcome: Progressing

## 2023-07-16 NOTE — Progress Notes (Signed)
 Staples to right knee removed, 22 staples noted, incision clean, dry and intact, no clinical S/S of infection noted, surgical opsite applied, pt. Tol. Well  Jean [name]

## 2023-07-16 NOTE — Progress Notes (Signed)
 Physical Therapy Treatment Patient Details Name: Jean [name] MRN: [id number] DOB: 11-23-1965 Today's Date: 07/16/2023   History of Present Illness Pt is 58 year old presented to [facility] on  07/11/23 for increasing rt knee pain after recent rt TKR on 07/02/23. She also reports fatigue and dizziness. Work up revealed anemia and +FOBT. Work-up on 3/7 was unable to locate a bleed. PMH - HTN, GERD, depression, MDD, COPD, THR, CVA, and asthma.    PT Comments  Pt received in bed, friend present and pt less anxious today. Pt demo proper knee position for stretching into extension as well as her flexion. Pt assisted with further knee ext and flex stretch as tolerated. She also tolerated TKA there ex well in supine, sitting, and standing. Pt ambulated 100' with RW and CGA and practiced 3 steps with min A. Continue to recommend continued inpt rehab <3 hrs/wk at d/c but pt mentions that her daughter may be coming from [place] so if this is the case and SNF is not an option could revisit option of HHPT. PT will continue to follow.     If plan is discharge home, recommend the following: A little help with walking and/or transfers;A little help with bathing/dressing/bathroom;Assist for transportation;Help with stairs or ramp for entrance;Assistance with cooking/housework   Can travel by private vehicle     Yes  Equipment Recommendations  None recommended by PT    Recommendations for Other Services       Precautions / Restrictions Precautions Precautions: Fall;Knee Precaution Booklet Issued: No Recall of Precautions/Restrictions: Intact Precaution/Restrictions Comments: pt able to demo proper positioning to promote knee ext Restrictions Weight Bearing Restrictions Per Provider Order: Yes RLE Weight Bearing Per Provider Order: Weight bearing as tolerated     Mobility  Bed Mobility Overal bed mobility: Needs Assistance Bed Mobility: Supine to Sit     Supine to sit: Modified independent  (Device/Increase time)     General bed mobility comments: pt able to move RLE off bed without assist from UE's today    Transfers Overall transfer level: Needs assistance Equipment used: Rolling walker (2 wheels) Transfers: Sit to/from Stand Sit to Stand: Contact guard assist           General transfer comment: pt stood safely to RW    Ambulation/Gait Ambulation/Gait assistance: Contact guard assist Gait Distance (Feet): 100 Feet Assistive device: Rolling walker (2 wheels) Gait Pattern/deviations: Decreased weight shift to right, Antalgic, Decreased stride length, Knee flexed in stance - right, Step-through pattern Gait velocity: reduced Gait velocity interpretation: 1.31 - 2.62 ft/sec, indicative of limited community ambulator   General Gait Details: working on R knee ext in stance phase and heel strike but this is limited by lack of extension   Stairs Stairs: Yes Stairs assistance: Min assist Stair Management: Step to pattern, One rail Left, Forwards Number of Stairs: 3 General stair comments: pt very hesitant to put full wt on RLE as she steps up with LLE. HHA on L and rail on R. Pt able to achieve with min A. Had an easier time second 2 steps.   Wheelchair Mobility     Tilt Bed    Modified Rankin (Stroke Patients Only)       Balance Overall balance assessment: Needs assistance Sitting-balance support: No upper extremity supported, Feet supported Sitting balance-Leahy Scale: Good     Standing balance support: Reliant on assistive device for balance, Bilateral upper extremity supported Standing balance-Leahy Scale: Poor Standing balance comment: pt more steady in standing  with shoes on today                            Communication Communication Communication: No apparent difficulties  Cognition Arousal: Alert Behavior During Therapy: WFL for tasks assessed/performed   PT - Cognitive impairments: No family/caregiver present to determine  baseline                       PT - Cognition Comments: pt more settled today during session, less anxious, had a friend present in room, attended better to instructions Following commands: Intact      Cueing Cueing Techniques: Verbal cues  Exercises Total Joint Exercises Ankle Circles/Pumps: AROM, Both, 10 reps, Supine Heel Slides: Right, 10 reps, Seated, AROM Long Arc Quad: AROM, Right, 10 reps, Seated Knee Flexion: AAROM, Right, 5 reps, Seated Goniometric ROM: 15-80 Marching in Standing: AROM, Right, 10 reps, Standing Other Exercises Other Exercises: knee ext stretch x1 min Other Exercises: patellar mobilization Other Exercises: assisted knee flexion stretch in sitting x1 min    General Comments        Pertinent Vitals/Pain Pain Assessment Pain Assessment: Faces Faces Pain Scale: Hurts even more Pain Location: R knee and ankle Pain Descriptors / Indicators: Sore, Grimacing, Guarding Pain Intervention(s): Limited activity within patient's tolerance, Monitored during session    Home Living                          Prior Function            PT Goals (current goals can now be found in the care plan section) Acute Rehab PT Goals Patient Stated Goal: to go to rehab to get better before going home PT Goal Formulation: With patient Time For Goal Achievement: 07/27/23 Potential to Achieve Goals: Good Progress towards PT goals: Progressing toward goals    Frequency    Min 3X/week      PT Plan      Co-evaluation              AM-PAC PT "6 Clicks" Mobility   Outcome Measure  Help needed turning from your back to your side while in a flat bed without using bedrails?: None Help needed moving from lying on your back to sitting on the side of a flat bed without using bedrails?: A Little Help needed moving to and from a bed to a chair (including a wheelchair)?: A Little Help needed standing up from a chair using your arms (e.g., wheelchair  or bedside chair)?: A Little Help needed to walk in hospital room?: A Lot Help needed climbing 3-5 steps with a railing? : A Lot 6 Click Score: 17    End of Session Equipment Utilized During Treatment: Gait belt Activity Tolerance: Patient tolerated treatment well Patient left: in bed;with call bell/phone within reach;with family/visitor present Nurse Communication: Mobility status PT Visit Diagnosis: Other abnormalities of gait and mobility (R26.89);Pain Pain - Right/Left: Right Pain - part of body: Knee     Time: 1131-1148 PT Time Calculation (min) (ACUTE ONLY): 17 min  Charges:    $Gait Training: 8-22 mins PT General Charges $$ ACUTE PT VISIT: 1 Visit                     Lyanne Co, PT  Acute Rehab Services Secure chat preferred Office [phone]    Lawana Chambers Dafina Suk 07/16/2023, 1:40 PM

## 2023-07-16 NOTE — Progress Notes (Signed)
 HD#0 SUBJECTIVE:  Patient Summary:Jean [name] is a 58 y.o. with a pertinent PMH of HTN, sickle cell trait, GERD, osteoarthritis w/ recent total R knee replacement who presented to [facility] ED with severe R knee pain admitted for symptomatic anemia.   Overnight Events: None  Interim History: Patient is evaluated at bedside. Reports that she is doing well. Has some itching around the area of the dressing on her R knee. Reports some R knee discomfort.   OBJECTIVE:  Vital Signs: Vitals:   07/15/23 1618 07/15/23 1700 07/15/23 2114 07/16/23 0516  BP:   (!) 172/94 138/78  Pulse:   80 79  Resp: 16 18 18 18   Temp:   98.3 F (36.8 C) 98.5 F (36.9 C)  TempSrc:   Oral Oral  SpO2:   99% 99%  Weight:      Height:       Supplemental O2: Room Air SpO2: 99 %  Filed Weights   07/11/23 0747  Weight: 61.7 kg     Intake/Output Summary (Last 24 hours) at 07/16/2023 0720 Last data filed at 07/16/2023 0516 Gross per 24 hour  Intake 1080 ml  Output 0 ml  Net 1080 ml   Net IO Since Admission: 4,445 mL [07/16/23 0720]  Physical Exam: Physical Exam  General: Sitting beside by bed, no acute distress  HEENT: Atraumatic, normocephalic Cardiovascular: Regular rate Pulmonary: Normal work of breathing, no wheezing or crackles Abdomen: Soft, nontender, nondistended MSK: ROM intact, RLE with minor limitation due to pain. No new swelling or erythema.   Patient Lines/Drains/Airways Status     Active Line/Drains/Airways     Name Placement date Placement time Site Days   Peripheral IV 07/11/23 20 G Distal;Left;Posterior Forearm 07/11/23  0811  Forearm  5   Incision (Closed) 07/11/23 Knee Anterior;Right 07/11/23  2000  -- 5   Incision - 3 Ports Abdomen Umbilicus Right;Lower Lower;Left 02/25/18  1230  -- 1967             ASSESSMENT/PLAN:  Assessment: Principal Problem:   Symptomatic anemia Active Problems:   Chronic kidney disease, stage 3a (HCC)   History of cardioembolic  cerebrovascular accident (CVA)   Status post total right knee replacement   Hemarthrosis   Melena   Occult blood in stools   Gastritis determined by endoscopy   Plan: Symptomatic anemia Iron Deficiency  (+) FOBT  EGD and colonoscopy 3/7 did not reveal source of bleed. Anemia is most likely multifactorial with contribution from hemarthrosis and possible GI bleed that has resolved/ present in the small bowel, with the positive FOBT and melenic stool 3 weeks prior. -Hgb 9.2 (8.9), stable -Oral PPI 40mg  daily for erythematous gastric mucosa  -Continue to avoid NSAIDs  -GI has signed off and is recommending CBC outpatient check in 1-2 weeks, repeat colonoscopy due to inadequate bowel prep, and outpatient follow up   -Medically stable for discharge pending SNF placement   S/P Right TKA Hemarthrosis   S/p total knee replacement 02/25. CXR 3/6 showed interval increase in now moderate joint effusion and interval decrease in intra-articular air.  Orthopedics were consulted in the ED who performed an aspiration with red blood consistent with a hematoma. Overall, no swelling or erythema noted. Afebrile. HDS.  -Continue PT/OT     Elevated Serum Creatinine, baseline around 1.3-1.4 Most likely due to decrease in oral intake and increase in stool output from bowel prep. - Slight increase in Scr from 1.18 to 1.22  - Encourage oral intake today -  Trend Cr     Hx CVA - Resumed ASA 81mg   - Continue lipitor   Lymphadenopathy, R groin  R groin lymphadenopathy palpated on exam with tenderness to palpation. Most likely due to reactive lymphadenopathy from recent R TKA. No infectious causes identified, R knee pain and edema have improved and she denies G/U sources of infection to cause the reactive lymphadenopathy.    Mildly thickened and calcified aortic valve  There is minimally restricted aortic valve movement, patient is asymptomatic and HDS. -Continue to monitor    Thrombocytosis At this time  suspect this is reactive in setting of acute blood loss anemia with underlying IDA.  - Peripheral smear is normal  - PLT 670 (654)   HTN Home regimen is listed as amlodipine 10 mg daily, metoprolol 12.5 mg BID, spironolactone 25 mg daily. She states she has been taking amlodipine only at home, however fill history is supportive of taking spironolactone only.  - Continue spironolactone 25 mg daily    Asthma COPD No evidence of acute exacerbation. Not compliant with home breathing treatments. -Continue home albuterol 2 puff q4h PRN wheezing, shortness of breath   Depression -Continue home sertraline 25 mg daily  Best Practice: Diet: Regular diet IVF: Fluids: None, Rate: None VTE: heparin injection 5,000 Units Start: 07/13/23 1700 Place TED hose Start: 07/13/23 1656 Code: Full AB: None Therapy Recs: SNF, DME: none DISPO: Anticipated discharge  1-2 days  to Skilled nursing facility pending placement.  Signature: Jeral Pinch, D.O.  Internal Medicine Resident, PGY-1 [facility]  Pager: [phone] 7:20 AM, 07/16/2023   Please contact the on call pager after 5 pm and on weekends at [phone].

## 2023-07-16 NOTE — Progress Notes (Signed)
 Mobility Specialist Progress Note:    07/16/23 0900  Mobility  Activity Ambulated with assistance in hallway  Level of Assistance Contact guard assist, steadying assist  Assistive Device Front wheel walker  Distance Ambulated (ft) 100 ft  RLE Weight Bearing Per Provider Order WBAT  Activity Response Tolerated well  Mobility Referral Yes  Mobility visit 1 Mobility  Mobility Specialist Start Time (ACUTE ONLY) 0857  Mobility Specialist Stop Time (ACUTE ONLY) 0905  Mobility Specialist Time Calculation (min) (ACUTE ONLY) 8 min   Received pt in bed having no complaints and agreeable to mobility. Pt was asymptomatic throughout ambulation and returned to room w/o fault. Left in bed w/ call bell in reach and all needs met.   D'Vante Earlene Plater Mobility Specialist Please contact via Special educational needs teacher or Rehab office at [phone]

## 2023-07-17 ENCOUNTER — Other Ambulatory Visit (HOSPITAL_COMMUNITY): Payer: Self-pay

## 2023-07-17 DIAGNOSIS — N1831 Chronic kidney disease, stage 3a: Secondary | ICD-10-CM | POA: Diagnosis not present

## 2023-07-17 DIAGNOSIS — D649 Anemia, unspecified: Secondary | ICD-10-CM | POA: Diagnosis not present

## 2023-07-17 DIAGNOSIS — Z96651 Presence of right artificial knee joint: Secondary | ICD-10-CM | POA: Diagnosis not present

## 2023-07-17 LAB — CBC
HCT: 28.1 % — ABNORMAL LOW (ref 36.0–46.0)
Hemoglobin: 9 g/dL — ABNORMAL LOW (ref 12.0–15.0)
MCH: 30.8 pg (ref 26.0–34.0)
MCHC: 32 g/dL (ref 30.0–36.0)
MCV: 96.2 fL (ref 80.0–100.0)
Platelets: 664 10*3/uL — ABNORMAL HIGH (ref 150–400)
RBC: 2.92 MIL/uL — ABNORMAL LOW (ref 3.87–5.11)
RDW: 12.9 % (ref 11.5–15.5)
WBC: 8.6 10*3/uL (ref 4.0–10.5)
nRBC: 0 % (ref 0.0–0.2)

## 2023-07-17 LAB — BASIC METABOLIC PANEL
Anion gap: 8 (ref 5–15)
BUN: 15 mg/dL (ref 6–20)
CO2: 29 mmol/L (ref 22–32)
Calcium: 9.7 mg/dL (ref 8.9–10.3)
Chloride: 101 mmol/L (ref 98–111)
Creatinine, Ser: 1.35 mg/dL — ABNORMAL HIGH (ref 0.44–1.00)
GFR, Estimated: 45 mL/min — ABNORMAL LOW (ref >60)
Glucose, Bld: 116 mg/dL — ABNORMAL HIGH (ref 70–99)
Potassium: 4 mmol/L (ref 3.5–5.1)
Sodium: 138 mmol/L (ref 135–145)

## 2023-07-17 MED ORDER — PANTOPRAZOLE SODIUM 40 MG PO TBEC
40.0000 mg | DELAYED_RELEASE_TABLET | Freq: Every day | ORAL | 1 refills | Status: DC
  Filled 2023-07-17: qty 90, 90d supply, fill #0

## 2023-07-17 MED ORDER — OXYCODONE HCL 10 MG PO TABS
10.0000 mg | ORAL_TABLET | Freq: Four times a day (QID) | ORAL | 0 refills | Status: DC | PRN
  Filled 2023-07-17 (×2): qty 12, 3d supply, fill #0

## 2023-07-17 NOTE — Plan of Care (Signed)
  Problem: Health Behavior/Discharge Planning: Goal: Ability to manage health-related needs will improve Outcome: Adequate for Discharge   Problem: Clinical Measurements: Goal: Will remain free from infection Outcome: Adequate for Discharge Goal: Diagnostic test results will improve Outcome: Adequate for Discharge Goal: Respiratory complications will improve Outcome: Adequate for Discharge Goal: Cardiovascular complication will be avoided Outcome: Adequate for Discharge   Problem: Activity: Goal: Risk for activity intolerance will decrease Outcome: Adequate for Discharge   Problem: Nutrition: Goal: Adequate nutrition will be maintained Outcome: Adequate for Discharge   Problem: Coping: Goal: Level of anxiety will decrease Outcome: Adequate for Discharge   Problem: Elimination: Goal: Will not experience complications related to bowel motility Outcome: Adequate for Discharge Goal: Will not experience complications related to urinary retention Outcome: Adequate for Discharge   Problem: Pain Managment: Goal: General experience of comfort will improve and/or be controlled Outcome: Adequate for Discharge   Problem: Safety: Goal: Ability to remain free from injury will improve Outcome: Adequate for Discharge   Problem: Skin Integrity: Goal: Risk for impaired skin integrity will decrease Outcome: Adequate for Discharge   Problem: Education: Goal: Required Educational Video(s) Outcome: Adequate for Discharge   Problem: Clinical Measurements: Goal: Ability to maintain clinical measurements within normal limits will improve Outcome: Adequate for Discharge Goal: Postoperative complications will be avoided or minimized Outcome: Adequate for Discharge   Problem: Skin Integrity: Goal: Demonstration of wound healing without infection will improve Outcome: Adequate for Discharge

## 2023-07-17 NOTE — TOC Transition Note (Signed)
 Transition of Care [facility]) - Discharge Note   Patient Details  Name: Jean [name] MRN: [id number] Date of Birth: [birth year]  [facility] CM/SW Contact:  Tom-Johnson, Hershal Coria, RN Phone Number: 07/17/2023, 11:23 AM   Clinical Narrative:     Patient is scheduled for discharge today.  Home health info, Outpatient f/u, hospital f/u and discharge instructions on AVS. CM consulted for assistance with transportation for pickup meds and MD appointments.  CM spoke with patient and daughter, Zenon Mayo at bedside. Patient states Zenon Mayo will pickup her meds. CM informed patient she can call Medicaid transportation to schedule her appointment days. Patient appreciate information given.  Daughters to transport at discharge.  No further TOC needs noted.       Final next level of care: Home w Home Health Services Barriers to Discharge: Barriers Resolved   Patient Goals and CMS Choice Patient states their goals for this hospitalization and ongoing recovery are:: To return home CMS Medicare.gov Compare Post Acute Care list provided to:: Patient Choice offered to / list presented to : Patient, Adult Children (Daughters)      Discharge Placement                Patient to be transferred to facility by: Daughter Name of family member notified: Sheena    Discharge Plan and Services Additional resources added to the After Visit Summary for                  DME Arranged: N/A DME Agency: NA       HH Arranged: PT, OT, RN, Nurse's Aide HH Agency: [facility] Date HH Agency Contacted: 07/12/23 Time HH Agency Contacted: 1700 Representative spoke with at [facility] Agency: Adele Dan  Social Drivers of Health (SDOH) Interventions SDOH Screenings   Food Insecurity: Food Insecurity Present (07/12/2023)  Housing: Low Risk  (07/12/2023)  Transportation Needs: Unmet Transportation Needs (07/12/2023)  Utilities: Not At Risk (07/11/2023)  Recent Concern: Utilities -  At Risk (07/02/2023)  Alcohol Screen: Low Risk  (06/20/2023)  Depression (PHQ2-9): High Risk (06/05/2023)  Financial Resource Strain: High Risk (06/13/2023)  Social Connections: Socially Isolated (06/05/2023)  Stress: Stress Concern Present (06/13/2023)  Tobacco Use: Medium Risk (07/12/2023)  Health Literacy: Adequate Health Literacy (06/20/2023)     Readmission Risk Interventions     No data to display

## 2023-07-17 NOTE — Progress Notes (Signed)
 PT Cancellation Note  Patient Details Name: AJANAE VIRAG MRN: [id number] DOB: Mar 24, 1966   Cancelled Treatment:    Reason Eval/Treat Not Completed: Other (comment); reports already walked and did stairs with mobility this morning and planning for d/c today.  Discussed keeping knee straight without pillow under as pt resting with leg crossed under her operated leg and pillow under.  She removed and verbalized understanding and demonstrated extension and flexion ROM, noted lacking extension.  Spoke with RN as pt requesting pain meds and asking if HHPT set up.    Elray Mcgregor 07/17/2023, 11:01 AM Sheran Lawless, PT Acute Rehabilitation Services Office:[phone] 07/17/2023

## 2023-07-17 NOTE — Progress Notes (Signed)
 Mobility Specialist Progress Note:    07/17/23 0914  Therapy Vitals  Pulse Rate 100  BP 125/74  Patient Position (if appropriate) Lying  Oxygen Therapy  SpO2 97 %  O2 Device Room Air  Mobility  Activity Ambulated with assistance in hallway  Level of Assistance Standby assist, set-up cues, supervision of patient - no hands on  Assistive Device Front wheel walker  Distance Ambulated (ft) 100 ft  RLE Weight Bearing Per Provider Order WBAT  Activity Response Tolerated well  Mobility Referral Yes  Mobility visit 1 Mobility  Mobility Specialist Start Time (ACUTE ONLY) 0857  Mobility Specialist Stop Time (ACUTE ONLY) 0907  Mobility Specialist Time Calculation (min) (ACUTE ONLY) 10 min   Pt received on EOB and agreeable. Asymptomatic w/ no complaints throughout. Completed 2 step stair trial w/ minG. Returned to room w/o fault. Pt left on EOB with call bell and family present.  D'Vante Earlene Plater Mobility Specialist Please contact via Special educational needs teacher or Rehab office at [phone]

## 2023-07-18 ENCOUNTER — Other Ambulatory Visit: Payer: Self-pay | Admitting: Obstetrics and Gynecology

## 2023-07-18 LAB — SURGICAL PATHOLOGY

## 2023-07-18 NOTE — Patient Instructions (Signed)
 Visit Information  Ms. [name]  - as a part of your Medicaid benefit, you are eligible for care management and care coordination services at no cost or copay. I was unable to reach you by phone today but would be happy to help you with your health related needs. Please feel free to call me at [phone].  A member of the Managed Medicaid care management team will reach out to you again over the next 30 business  days.   Kathi Der RNC, BSN, [employer] [facility] RN Care Manager Direct Dial [phone] Website: [email or web address]

## 2023-07-18 NOTE — Patient Outreach (Signed)
  Medicaid Managed Care   Unsuccessful Attempt Note   07/18/2023 Name: Jean [name] MRN: [id number] DOB: [birth year]  Referred by: [name], DO Reason for referral : High Risk Managed Medicaid (Unsuccessful telephone outreach)  An unsuccessful telephone outreach was attempted today. The patient was referred to the case management team for assistance with care management and care coordination.    Follow Up Plan: The Managed Medicaid care management team will reach out to the patient again over the next 30 business  days. and The  Patient has been provided with contact information for the Managed Medicaid care management team and has been advised to call with any health related questions or concerns.   Kathi Der RNC, BSN, [employer] [facility] RN Care Manager Direct Dial [phone] Website: [email or web address]

## 2023-07-19 ENCOUNTER — Other Ambulatory Visit: Payer: Self-pay | Admitting: Obstetrics and Gynecology

## 2023-07-19 ENCOUNTER — Ambulatory Visit: Payer: Self-pay | Admitting: Student

## 2023-07-19 ENCOUNTER — Telehealth: Payer: Self-pay | Admitting: Orthopaedic Surgery

## 2023-07-19 ENCOUNTER — Other Ambulatory Visit: Payer: Self-pay | Admitting: Orthopaedic Surgery

## 2023-07-19 MED ORDER — OXYCODONE HCL 10 MG PO TABS: 10.0000 mg | ORAL_TABLET | Freq: Four times a day (QID) | ORAL | 0 refills | Status: DC | PRN

## 2023-07-19 NOTE — Patient Instructions (Signed)
 Visit Information  [name] was given information about Medicaid Managed Care team care coordination services as a part of their [facility] Medicaid benefit. Jean [name] verbally consented to engagement with the [facility] Managed Care team.   If you are experiencing a medical emergency, please call 911 or report to your local emergency department or urgent care.   If you have a non-emergency medical problem during routine business hours, please contact your provider's office and ask to speak with a nurse.   For questions related to your [facility], please call: [phone] or visit the homepage here: [email or web address]  If you would like to schedule transportation through your [facility], please call the following number at least 2 days in advance of your appointment: [phone]   Rides for urgent appointments can also be made after hours by calling Member Services.  Call the Behavioral Health Crisis Line at [phone], at any time, 24 hours a day, 7 days a week. If you are in danger or need immediate medical attention call 911.  If you would like help to quit smoking, call 1-800-QUIT-NOW (438-859-1139) OR Espaol: 1-855-Djelo-Ya (1-324-401-0272) o para ms informacin haga clic aqu or Text READY to 536-644 to register via text  Jean [name] - following are the goals we discussed in your visit today:   Goals Addressed    Timeframe:  Long-Range Goal Priority:  High Start Date: 06/20/23                            Expected End Date:   ongoing                    Follow Up Date 08/16/23   - practice safe sex - schedule appointment for vaccines needed due to my age or health - schedule recommended health tests (blood work, mammogram, colonoscopy, pap test) - schedule and keep appointment for annual check-up    Why is this important?   Screening tests  can find diseases early when they are easier to treat.  Your doctor or nurse will talk with you about which tests are important for you.  Getting shots for common diseases like the flu and shingles will help prevent them.   07/19/23: Patient has hospital f/u 3/18 with PCP.  To call [facility] today regarding f/u.  HHPT to start.  The patient verbalized understanding of instructions, educational materials, and care plan provided today and DECLINED offer to receive copy of patient instructions, educational materials, and care plan.   The Managed Medicaid care management team will reach out to the patient again over the next 30 business  days.  The  Patient has been provided with contact information for the Managed Medicaid care management team and has been advised to call with any health related questions or concerns.   Kathi Der RNC, BSN, [employer] [facility] RN Care Manager Direct Dial [phone] Website: [email or web address]   Following is a copy of your plan of care:  Care Plan : RN Care Manager Plan of Care  Updates made by Danie Chandler, RN since 07/19/2023 12:00 AM     Problem: Health Promotion or Disease Self-Management (General Plan of Care)      Long-Range Goal: Chronic Disease Management and Care Coordination Needs   Start Date: 06/20/2023  Expected End Date: 09/17/2023  Priority: High  Note:   Current Barriers:  Knowledge  Deficits related to plan of care for management of HTN, COPD, dysphagia, GERD, osteoarthritis, CKD, chronic pain syndrome, MDD Care Coordination needs related to food resources Chronic Disease Management support and education needs related to HTN, COPD, dysphagia, GERD, osteoarthritis, CKD, chronic pain syndrome, MDD Financial Constraints  07/19/23:  No available BP readings today from patient.  Hospitalized 3/6-3/12 for anemia.  C/O right knee pain s/p TKA 2/25.  Oxycodone prn with little relief.  Patient to f/u with  [facility] today.  Awaiting start of HHPT from [facility]-patient to call if she does not here from them.  Has BSW and LCSW f/u scheduled.    RNCM Clinical Goal(s):  verbalize understanding of plan for management of HTN, COPD, dysphagia, GERD, osteoarthritis, CKD, chronic pain syndrome, MDD as evidenced by patient report verbalize basic understanding of  HTN, COPD, dysphagia, GERD, osteoarthritis, CKD, chronic pain syndrome, MDD disease process and self health management plan as evidenced by patient report take all medications exactly as prescribed and will call provider for medication related questions as evidenced by patient report demonstrate understanding of rationale for each prescribed medication as evidenced by patient report attend all scheduled medical appointments:as evidenced by patient report and EMR review demonstrate ongoing adherence to prescribed treatment plan for HTN, COPD, dysphagia, GERD, osteoarthritis, CKD, chronic pain syndrome, MDD  as evidenced by patient report and EMR review continue to work with RN Care Manager to address care management and care coordination needs related to  HTN, COPD, dysphagia, GERD, osteoarthritis, CKD, chronic pain syndrome, MDD as evidenced by adherence to CM Team Scheduled appointments work with Child psychotherapist to address anxiety, depression, resources  related to the management of HTN, COPD, dysphagia, GERD, osteoarthritis, CKD, chronic pain syndrome, MDD   as evidenced by review of EMR and patient or Child psychotherapist report through collaboration with Medical illustrator, provider, and care team.   Interventions: Evaluation of current treatment plan related to  self management and patient's adherence to plan as established by provider 06/30/23:  Collaborated with BSW, BSW referral for food resources-completed.   Chronic Kidney Disease Interventions:  (Status:  New goal.) Long Term Goal Evaluation of current treatment plan related to chronic kidney disease  self management and patient's adherence to plan as established by provider      Reviewed medications with patient and discussed importance of compliance    Discussed complications of poorly controlled blood pressure such as heart disease, stroke, circulatory complications, vision complications, kidney impairment, sexual dysfunction    Reviewed scheduled/upcoming provider appointments including    Discussed plans with patient for ongoing care management follow up and provided patient with direct contact information for care management team    Assessed social determinant of health barriers    Last practice recorded BP readings:  BP Readings from Last 3 Encounters:  06/05/23 120/74  04/23/23 (!) 150/100  02/17/23 (!) 161/83   Most recent eGFR/CrCl:  Lab Results  Component Value Date   EGFR 42 (L) 11/29/2022    No components found for: "CRCL"  COPD Interventions:  (Status:  New goal.) Long Term Goal Advised patient to track and manage COPD triggers Advised patient to self assesses COPD action plan zone and make appointment with provider if in the yellow zone for 48 hours without improvement Provided education about and advised patient to utilize infection prevention strategies to reduce risk of respiratory infection Discussed the importance of adequate rest and management of fatigue with COPD Referral made to community resources care guide team for  assistance  Assessed social determinant of health barriers  Hypertension Interventions:  (Status:  New goal.) Long Term Goal Last practice recorded BP readings:  BP Readings from Last 3 Encounters:  06/05/23 120/74  04/23/23 (!) 150/100  02/17/23 (!) 161/83   Most recent eGFR/CrCl:  Lab Results  Component Value Date   EGFR 42 (L) 11/29/2022    No components found for: "CRCL"  Evaluation of current treatment plan related to hypertension self management and patient's adherence to plan as established by provider Reviewed medications with  patient and discussed importance of compliance Discussed plans with patient for ongoing care management follow up and provided patient with direct contact information for care management team Advised patient, providing education and rationale, to monitor blood pressure daily and record, calling PCP for findings outside established parameters Reviewed scheduled/upcoming provider appointments  Discussed complications of poorly controlled blood pressure such as heart disease, stroke, circulatory complications, vision complications, kidney impairment, sexual dysfunction Assessed social determinant of health barriers  Pain Interventions:  (Status:  New goal.) Long Term Goal Pain assessment performed Medications reviewed Reviewed provider established plan for pain management Discussed importance of adherence to all scheduled medical appointments Counseled on the importance of reporting any/all new or changed pain symptoms or management strategies to pain management provider Advised patient to report to care team affect of pain on daily activities Reviewed with patient prescribed pharmacological and nonpharmacological pain relief strategies Assessed social determinant of health barriers  SDOH Barriers (Status:  New goal.) Long Term Goal Patient interviewed and SDOH assessment performed        SDOH Interventions    Flowsheet Row Patient Outreach Telephone from 06/20/2023 in [place] [facility] DEPARTMENT Patient Outreach Telephone from 06/13/2023 in [place] [facility] POP[facility] DEPARTMENT Office Visit from 06/05/2023 in [facility] - A Dept Of [place]. [facility] Office Visit from 05/04/2021 in [facility] - A Dept Of [place]. [facility] Office Visit from 10[facility]28/2019 in [facility] - A Dept Of [place]. [facility] Office Visit from 04/24/2017 in [facility] - A Dept Of [place]. [facility]  SDOH Interventions        Food Insecurity Interventions -- Community Resources Provided Intervention Not Indicated -- -- --  Housing Interventions -- Intervention Not Indicated Intervention Not Indicated -- -- --  Transportation Interventions -- -- Intervention Not Indicated -- -- --  Utilities Interventions -- Intervention Not Indicated Intervention Not Indicated -- -- --  Alcohol Usage Interventions Intervention Not Indicated (Score <7) -- -- -- -- --  Depression Interventions/Treatment  -- -- Medication Medication  [PATIENT STATES SHE ON MEDICATION] Medication Medication  Financial Strain Interventions -- [employer] Provided Other (Comment)  [need referral to social worker] -- -- --  Stress Interventions -- [employer] Provided, Provide Counseling Intervention Not Indicated -- -- --  Social Connections Interventions -- -- Intervention Not Indicated -- -- --  Health Literacy Interventions Intervention Not Indicated -- -- -- -- --      Referred patient to community resources care guide team for assistance with food resources Discussed plans with patient for ongoing care management follow up and provided patient with direct contact information for care management team  Surgery (right knee arthroplasty 2/25):  (Status: New goal.) Short Term Goal Evaluation of current treatment plan related to right knee arthroplasty 2/25 surgery assessed patient/caregiver understanding of surgical procedure   reviewed  medications with patient and addressed questions reviewed scheduled provider appointments with patient confirmed availability of transportation to all appointments referred to social work to address anxiety/depression/food resources  Patient Goals/Self-Care Activities: Take all medications as prescribed Attend all scheduled provider appointments Call pharmacy for medication refills 3-7 days in advance of running out of medications Perform all self care activities  independently  Perform IADL's (shopping, preparing meals, housekeeping, managing finances) independently Call provider office for new concerns or questions  Work with the social worker to address care coordination needs and will continue to work with the clinical team to address health care and disease management related needs 07/19/23: Patient to f/u with [facility].  Follow Up Plan:  The patient has been provided with contact information for the care management team and has been advised to call with any health related questions or concerns.  The care management team will reach out to the patient again over the next 30 business days

## 2023-07-19 NOTE — Telephone Encounter (Signed)
 Pt called requesting refill of medication. Please send to pharmacy on file. Pt phone number is [phone].

## 2023-07-19 NOTE — Patient Outreach (Signed)
 Medicaid Managed Care   Nurse Care Manager Note  07/19/2023 Name:  LETTY SALVI MRN:  [id number] DOB:  Aug 07, 1965  Hardin Negus Bollier is an 58 y.o. year old female who is a primary patient of [name], DO.  [facility] Managed Care Coordination team was consulted for assistance with:    Chronic healthcare management needs, HTN, COPD, GERD, osteoarthritis, dysphagia, CKD, chronic pain syndrome, MDD.  Ms. Villers was given information about Medicaid Managed Care Coordination team services today. [name] Patient agreed to services and verbal consent obtained.  Engaged with patient by telephone for follow up visit in response to provider referral for case management and/or care coordination services.   Patient is participating in a Managed Medicaid Plan:  Yes  Assessments/Interventions:  Review of past medical history, allergies, medications, health status, including review of consultants reports, laboratory and other test data, was performed as part of comprehensive evaluation and provision of chronic care management services.  SDOH (Social Drivers of Health) assessments and interventions performed: SDOH Interventions    Flowsheet Row Patient Outreach Telephone from 07/19/2023 in [place] POPULATION HEALTH DEPARTMENT ED to Hosp-Admission (Discharged) from 07/11/2023 in [facility] 13M KIDNEY UNIT Admission (Discharged) from 07/02/2023 in [facility]TH ORTHOPEDICS Patient Outreach Telephone from 06/20/2023 in [place] POPULATION HEALTH DEPARTMENT Patient Outreach Telephone from 06/13/2023 in [place] POPULATION HEALTH DEPARTMENT Office Visit from 06/05/2023 in [facility] - [facility] Dept Of [place]. [facility]  SDOH Interventions        Food Insecurity Interventions -- -- Other (Comment), Inpatient TOC  [food pantry list provided] -- Community Resources Provided Intervention Not Indicated  Housing Interventions -- -- -- --  Intervention Not Indicated Intervention Not Indicated  Transportation Interventions -- Inpatient TOC, Intervention Not Indicated, Patient Resources Dietitian) -- -- -- Intervention Not Indicated  Utilities Interventions -- -- Inpatient TOC, Intervention Not Indicated -- Intervention Not Indicated Intervention Not Indicated  Alcohol Usage Interventions -- -- -- Intervention Not Indicated (Score <7) -- --  Depression Interventions/Treatment  -- -- -- -- -- Medication  Financial Strain Interventions -- -- -- -- Programmer, applications Provided Other (Comment)  [need referral to social worker]  Physical Activity Interventions Intervention Not Indicated  [not physically able to-s/p right TKA 2/25] -- -- -- -- --  Stress Interventions -- -- -- -- Programmer, applications Provided, Provide Counseling Intervention Not Indicated  Social Connections Interventions Intervention Not Indicated -- -- -- -- Intervention Not Indicated  Health Literacy Interventions -- -- -- Intervention Not Indicated -- --     Care Plan Allergies  Allergen Reactions   Lisinopril Swelling and Other (See Comments)    Lip swelling, patient reported - unable to confirm from chart     Medications Reviewed Today     Reviewed by Danie Chandler, RN (Registered Nurse) on 07/19/23 at 1249  Med List Status: <None>   Medication Order Taking? Sig Documenting Provider Last Dose Status Informant  acetaminophen (TYLENOL) 500 MG tablet [id number] No Take 1,000 mg by mouth every 6 (six) hours as needed for moderate pain (pain score 4-6). [provider] Past Week Active Self, Multiple Informants, Pharmacy Records  albuterol (VENTOLIN HFA) 108 (90 Base) MCG/ACT inhaler [id number] No Inhale 2 puffs into the lungs every 4 (four) hours as needed for wheezing or shortness of breath. Hunsucker, Lesia Sago, MD 07/10/2023 Active Self, Multiple Informants, Pharmacy Records  aspirin 81 MG chewable tablet [id number] No Chew  1 tablet (81 mg total) by  mouth 2 (two) times daily. Kathryne Hitch, MD 07/10/2023 Active Self, Multiple Informants, Pharmacy Records  atorvastatin (LIPITOR) 40 MG tablet [id number] No Take 1 tablet (40 mg total) by mouth daily.  Patient not taking: Reported on 07/11/2023   Sharlene Dory, [place] Not Taking Expired 07/02/23 2359   cyclobenzaprine (FLEXERIL) 10 MG tablet [id number] No Take 1 tablet (10 mg total) by mouth 3 (three) times daily as needed for muscle spasms. TAKE 1 TABLET BY MOUTH TWICE A DAY AS NEEDED FOR MUSCLE SPASMS  Patient taking differently: Take 10 mg by mouth 3 (three) times daily as needed for muscle spasms.   Kathryne Hitch, MD 07/10/2023 Active Self, Multiple Informants, Pharmacy Records  gabapentin (NEURONTIN) 300 MG capsule [id number] No TAKE 1 CAPSULE BY MOUTH EVERYDAY AT BEDTIME  Patient taking differently: Take 300 mg by mouth at bedtime.   Kathryne Hitch, MD 07/10/2023 Active Self, Multiple Informants, Pharmacy Records  Oxycodone HCl 10 MG TABS [id number]  Take 1 tablet (10 mg total) by mouth every 6 (six) hours as needed for up to 3 days. Tawkaliyar, Roya, DO  Active   pantoprazole (PROTONIX) 40 MG tablet [id number]  Take 1 tablet (40 mg total) by mouth daily. Jeral Pinch, DO  Active   sertraline (ZOLOFT) 25 MG tablet [id number] No TAKE 1 TABLET (25 MG TOTAL) BY MOUTH DAILY. [name], DO 07/10/2023 Active Self, Multiple Informants, Pharmacy Records  spironolactone (ALDACTONE) 25 MG tablet [id number]  TAKE 1 TABLET (25 MG TOTAL) BY MOUTH DAILY. Nahser, Deloris Ping, MD  Active            Patient Active Problem List   Diagnosis Date Noted   Hemarthrosis 07/12/2023   Melena 07/12/2023   Occult blood in stools 07/12/2023   Gastritis determined by endoscopy 07/12/2023   Symptomatic anemia 07/11/2023   Status post total right knee replacement 07/02/2023   Osteoarthritis of right knee 06/05/2023   Food insecurity 06/05/2023   Bilateral shoulder pain 11/29/2022    Dysphagia 11/29/2022   History of cardioembolic cerebrovascular accident (CVA) 11/29/2022   Dizziness 06/19/2022   Constipation 05/09/2021   Weight loss 05/09/2021   Headache    Symptomatic bradycardia 04/18/2021   Near syncope 04/18/2021   Synovitis of left knee 07/26/2020   COVID-19 virus infection 05/11/2020   Low back pain 11/24/2019   Impingement syndrome of right shoulder 04/22/2019   History of dental surgery 02/05/2019   COPD GOLD 0 ? 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  Care Plan : RN Care Manager Plan of Care  Updates made by Danie Chandler, RN since 07/19/2023 12:00 AM     Problem: Health Promotion or Disease Self-Management (General Plan of Care)      Long-Range Goal: Chronic Disease Management and Care Coordination Needs   Start Date: 06/20/2023  Expected End Date: 09/17/2023  Priority: High  Note:   Current Barriers:  Knowledge  Deficits related to plan of care for management of HTN, COPD, dysphagia, GERD, osteoarthritis, CKD, chronic pain syndrome, MDD Care Coordination needs related to food resources Chronic Disease Management support and education needs related to HTN, COPD, dysphagia, GERD, osteoarthritis, CKD, chronic pain syndrome, MDD Financial Constraints  07/19/23:  No available BP readings today from patient.  Hospitalized 3/6-3/12 for anemia.  C/O right knee pain s/p TKA 2/25.   Oxycodone prn with little relief.  Patient to f/u with ORTHO today.  Awaiting start of HHPT from [facility]-patient to call if she does not here from them.  Has BSW and LCSW f/u scheduled.    RNCM Clinical Goal(s):  verbalize understanding of plan for management of HTN, COPD, dysphagia, GERD, osteoarthritis, CKD, chronic pain syndrome, MDD as evidenced by patient report verbalize basic understanding of  HTN, COPD, dysphagia, GERD, osteoarthritis, CKD, chronic pain syndrome, MDD disease process and self health management plan as evidenced by patient report take all medications exactly as prescribed and will call provider for medication related questions as evidenced by patient report demonstrate understanding of rationale for each prescribed medication as evidenced by patient report attend all scheduled medical appointments:as evidenced by patient report and EMR review demonstrate ongoing adherence to prescribed treatment plan for HTN, COPD, dysphagia, GERD, osteoarthritis, CKD, chronic pain syndrome, MDD  as evidenced by patient report and EMR review continue to work with RN Care Manager to address care management and care coordination needs related to  HTN, COPD, dysphagia, GERD, osteoarthritis, CKD, chronic pain syndrome, MDD as evidenced by adherence to CM Team Scheduled appointments work with Child psychotherapist to address anxiety, depression, resources  related to the management of HTN, COPD, dysphagia, GERD, osteoarthritis, CKD, chronic pain syndrome, MDD   as evidenced by review of EMR and patient or Child psychotherapist report through collaboration with Medical illustrator, provider, and care team.   Interventions: Evaluation of current treatment plan related to  self management and patient's adherence to plan as established by provider 06/30/23:  Collaborated with BSW, BSW referral for food resources-completed.   Chronic Kidney Disease Interventions:  (Status:  New goal.) Long Term Goal Evaluation of  current treatment plan related to chronic kidney disease self management and patient's adherence to plan as established by provider      Reviewed medications with patient and discussed importance of compliance    Discussed complications of poorly controlled blood pressure such as heart disease, stroke, circulatory complications, vision complications, kidney impairment, sexual dysfunction    Reviewed scheduled/upcoming provider appointments including    Discussed plans with patient for ongoing care management follow up and provided patient with direct contact information for care management team    Assessed social determinant of health barriers    Last practice recorded BP readings:  BP Readings from Last 3 Encounters:  06/05/23 120/74  04/23/23 (!) 150/100  02/17/23 (!) 161/83   Most recent eGFR/CrCl:  Lab Results  Component Value Date   EGFR 42 (L) 11/29/2022    No components found for: "CRCL"  COPD Interventions:  (Status:  New goal.) Long Term Goal Advised patient to track and manage COPD triggers Advised patient to self assesses COPD action plan zone and make appointment with provider if in the yellow zone for 48 hours without improvement Provided education about and advised patient to utilize infection prevention strategies to reduce risk of respiratory infection Discussed the importance of adequate rest and management of fatigue with COPD Referral made to community resources care guide team for  assistance  Assessed social determinant of health barriers  Hypertension Interventions:  (Status:  New goal.) Long Term Goal Last practice recorded BP readings:  BP Readings from Last 3 Encounters:  06/05/23 120/74  04/23/23 (!) 150/100  02/17/23 (!) 161/83   Most recent eGFR/CrCl:  Lab Results  Component Value Date   EGFR 42 (L) 11/29/2022    No components found for: "CRCL"  Evaluation of current treatment plan related to hypertension self management and patient's adherence to  plan as established by provider Reviewed medications with patient and discussed importance of compliance Discussed plans with patient for ongoing care management follow up and provided patient with direct contact information for care management team Advised patient, providing education and rationale, to monitor blood pressure daily and record, calling PCP for findings outside established parameters Reviewed scheduled/upcoming provider appointments  Discussed complications of poorly controlled blood pressure such as heart disease, stroke, circulatory complications, vision complications, kidney impairment, sexual dysfunction Assessed social determinant of health barriers  Pain Interventions:  (Status:  New goal.) Long Term Goal Pain assessment performed Medications reviewed Reviewed provider established plan for pain management Discussed importance of adherence to all scheduled medical appointments Counseled on the importance of reporting any/all new or changed pain symptoms or management strategies to pain management provider Advised patient to report to care team affect of pain on daily activities Reviewed with patient prescribed pharmacological and nonpharmacological pain relief strategies Assessed social determinant of health barriers  SDOH Barriers (Status:  New goal.) Long Term Goal Patient interviewed and SDOH assessment performed        SDOH Interventions    Flowsheet Row Patient Outreach Telephone from 06/20/2023 in [place] POPULATION HEALTH DEPARTMENT Patient Outreach Telephone from 06/13/2023 in [place] [facility] POP[facility]ALTH DEPARTMENT Office Visit from 06/05/2023 in [facility] - A Dept Of [place]. [facility] Office Visit from 05/04/2021 in [facility] Med [facility] - A Dept Of [place]. [facility] Office Visit from 10/28/2019 in [facility] Internal Med Ctr - A Dept Of [place]. [facility] Office Visit from 04/24/2017 in [facility]tr - A Dept Of [place]. [facility]  SDOH Interventions        Food Insecurity Interventions -- Community Resources Provided Intervention Not Indicated -- -- --  Housing Interventions -- Intervention Not Indicated Intervention Not Indicated -- -- --  Transportation Interventions -- -- Intervention Not Indicated -- -- --  Utilities Interventions -- Intervention Not Indicated Intervention Not Indicated -- -- --  Alcohol Usage Interventions Intervention Not Indicated (Score <7) -- -- -- -- --  Depression Interventions/Treatment  -- -- Medication Medication  [PATIENT STATES SHE ON MEDICATION] Medication Medication  Financial Strain Interventions -- [employer] Provided Other (Comment)  [need referral to social worker] -- -- --  Stress Interventions -- [employer] Provided, Provide Counseling Intervention Not Indicated -- -- --  Social Connections Interventions -- -- Intervention Not Indicated -- -- --  Health Literacy Interventions Intervention Not Indicated -- -- -- -- --      Referred patient to community resources care guide team for assistance with food resources Discussed plans with patient for ongoing care management follow up and provided patient with direct contact information for care management team  Surgery (right knee arthroplasty 2/25):  (Status: New goal.) Short Term Goal Evaluation of current treatment plan related to right knee arthroplasty 2/25 surgery assessed patient/caregiver understanding of surgical procedure   reviewed  medications with patient and addressed questions reviewed scheduled provider appointments with patient confirmed availability of transportation to all appointments referred to social work to address anxiety/depression/food resources  Patient Goals/Self-Care Activities: Take all medications as prescribed Attend all scheduled provider appointments Call pharmacy for medication refills 3-7 days in advance of  running out of medications Perform all self care activities independently  Perform IADL's (shopping, preparing meals, housekeeping, managing finances) independently Call provider office for new concerns or questions  Work with the social worker to address care coordination needs and will continue to work with the clinical team to address health care and disease management related needs 07/19/23: Patient to f/u with ORTHO.  Follow Up Plan:  The patient has been provided with contact information for the care management team and has been advised to call with any health related questions or concerns.  The care management team will reach out to the patient again over the next 30 business days.   Long-Range Goal: Establish Plan of Care for Chronic Disease Management Needs and SDOH Barriers   Priority: High  Note:   Timeframe:  Long-Range Goal Priority:  High Start Date: 06/20/23                            Expected End Date:   ongoing                    Follow Up Date 08/16/23   - practice safe sex - schedule appointment for vaccines needed due to my age or health - schedule recommended health tests (blood work, mammogram, colonoscopy, pap test) - schedule and keep appointment for annual check-up    Why is this important?   Screening tests can find diseases early when they are easier to treat.  Your doctor or nurse will talk with you about which tests are important for you.  Getting shots for common diseases like the flu and shingles will help prevent them.   07/19/23: Patient has hospital f/u 3/18 with PCP.  To call ORTHO today regarding f/u.  HHPT to start.   Follow Up:  Patient agrees to Care Plan and Follow-up.  Plan: The Managed Medicaid care management team will reach out to the patient again over the next 30 business  days. and The  Patient has been provided with contact information for the Managed Medicaid care management team and has been advised to call with any health related  questions or concerns.  Date/time of next scheduled RN care management/care coordination outreach: 08/16/23 at 1

## 2023-07-19 NOTE — Telephone Encounter (Signed)
 Copied from CRM [id number]. Topic: Clinical - Red Word Triage >> Jul 19, 2023  3:14 PM Irine Seal wrote: [facility] that prompted transfer to Nurse Triage: patient called in to get a refill for her Oxycodone HCl 10 MG TABS she stated she is in severe pain that it is over a 10.  Recently discharged from hospital. Reason for Disposition  [1] Caller has URGENT medicine question about med that PCP or specialist prescribed AND [2] triager unable to answer question    Oxycodone being requested for pain  Additional Information  Commented on: Prescription request for new medicine (not a refill)    Or go to the ED since oxycodone was given to her from the hospital after total knee replacement  Answer Assessment - Initial Assessment Questions 1. NAME of MEDICINE: "What medicine(s) are you calling about?"     Oxycodone 10 mg  2. QUESTION: "What is your question?" (e.g., double dose of medicine, side effect)     I need a refill of my oxycodone.   This was given to me while in the hospital #12 pills.    Dr. Scarlette Shorts office closed today at 12 noon.     My pain is bad.   I had a total knee replacement done and I'm out of my pain medicine.    "I will call my orthopedic surgeon and see if he will/can refill it.    I let her know she can go to the ED if her pain is that severe and she can't get a refill from her orthopedic dr.   She was agreeable to this.  3. PRESCRIBER: "Who prescribed the medicine?" Reason: if prescribed by specialist, call should be referred to that group.     Dr. In the hospital.   Had total knee replacement. 4. SYMPTOMS: "Do you have any symptoms?" If Yes, ask: "What symptoms are you having?"  "How bad are the symptoms (e.g., mild, moderate, severe)     Knee pain 5. PREGNANCY:  "Is there any chance that you are pregnant?" "When was your last menstrual period?"     N/A due to age  Protocols used: Medication Question Call-A-AH  Chief Complaint: Requesting refill of oxycodone 10 mg that was  given to her from the hospital after total knee replacement.   Only given #12 tablets and she is out and in a lot of pain.  PCP office is closed.   Encouraged her to call her orthopedic surgeon for a refill or go to the ED since in so much pain. Symptoms: post op knee pain Frequency: now Pertinent Negatives: Patient denies having any oxycodone left.    Disposition: [x] ED /[] Urgent Care (no appt availability in office) / [] Appointment(In office/virtual)/ []  [place] [facility]/ [] Home Care/ [] Refused Recommended Disposition /[] [place] [facility]/ []  Follow-up with PCP Additional Notes: If you can't get in contact with orthopedic provider for a refill of the oxycodone then go to the ED.

## 2023-07-22 ENCOUNTER — Other Ambulatory Visit: Payer: Self-pay | Admitting: Orthopaedic Surgery

## 2023-07-22 ENCOUNTER — Telehealth: Payer: Self-pay | Admitting: Orthopaedic Surgery

## 2023-07-22 ENCOUNTER — Telehealth: Payer: Self-pay

## 2023-07-22 MED ORDER — OXYCODONE HCL 5 MG PO TABS: 5.0000 mg | ORAL_TABLET | Freq: Four times a day (QID) | ORAL | 0 refills | Status: DC | PRN

## 2023-07-22 NOTE — Telephone Encounter (Signed)
 Pt called stating pharmacy said they did mot receive refill for oxycodone. Please send to pharmacy on file. Please resend and call pt when  sent. Pt phone number is [phone].

## 2023-07-22 NOTE — Telephone Encounter (Signed)
 Filled today per pharmacy

## 2023-07-22 NOTE — Telephone Encounter (Signed)
 Call to [facility].  Patient was contacted and labeled as a missed visit on 3/6 informed Marylene Land from [facility] that patient was in the hospital.. Plans to call patient after 5 PM today or tomorrow morning to set her up for [facility] visits.  Patient was called and informed of and will await call from [facility].  Has an appointment on tomorrow morning in the Clinics.  Patient voiced understanding of the plan.

## 2023-07-22 NOTE — Telephone Encounter (Signed)
 Patient is requesting PCS , patient  stated that she needs help for cleaning and cooking. Request will be given to doctor to review.

## 2023-07-22 NOTE — Telephone Encounter (Signed)
 Patient calling asking if we can call in the Oxycodone 5mg  instead She states the 10mg  makes her super dizzy

## 2023-07-22 NOTE — Telephone Encounter (Signed)
 Copied from CRM [id number]. Topic: General - Other >> Jul 22, 2023  9:05 AM Nurse Sherlean Foot wrote: Will forward to PCP

## 2023-07-23 ENCOUNTER — Other Ambulatory Visit: Payer: Self-pay

## 2023-07-23 ENCOUNTER — Ambulatory Visit (INDEPENDENT_AMBULATORY_CARE_PROVIDER_SITE_OTHER): Admitting: Student

## 2023-07-23 ENCOUNTER — Encounter: Payer: Self-pay | Admitting: Student

## 2023-07-23 VITALS — BP 124/80 | HR 87 | Temp 98.0°F | Resp 32 | Ht 64.0 in | Wt 128.5 lb

## 2023-07-23 DIAGNOSIS — Z96651 Presence of right artificial knee joint: Secondary | ICD-10-CM | POA: Diagnosis not present

## 2023-07-23 DIAGNOSIS — D649 Anemia, unspecified: Secondary | ICD-10-CM | POA: Diagnosis present

## 2023-07-23 MED ORDER — PANTOPRAZOLE SODIUM 40 MG PO TBEC
40.0000 mg | DELAYED_RELEASE_TABLET | Freq: Every day | ORAL | 1 refills | Status: AC
Start: 2023-07-23 — End: ?

## 2023-07-23 MED ORDER — FERROUS SULFATE 325 (65 FE) MG PO TABS: 325.0000 mg | ORAL_TABLET | ORAL | 0 refills | Status: DC

## 2023-07-23 NOTE — Assessment & Plan Note (Addendum)
 HFU for this issue. She was admitted with downtrending Hg as low as 8.6 and report of melena. GI workup unrevealing but limited by poor bowel prep for colonoscopy. There remains suspicion of occult GI bleed exacerbated by her recent knee replacement procedure.  She is not taking NSAIDs.  She noticed 1 dark near black stool 2 days ago that returned to normal brown.  No tarry stools or overt blood.  No N/V, pain, dizziness, chills - she is asymptomatic. I will refill her Protonix and start iron, give instructions to avoid irritating medicines.  I will refer her to GI.  In the hospital, they were considering repeat scope versus pill endoscopy. - Protonix 40 daily - Refer to GI - CBC today - Iron replacement to rebuild and check iron stores

## 2023-07-23 NOTE — Assessment & Plan Note (Addendum)
 Her knee was replaced 3 weeks ago.  Saturday, 3 days ago, she was on her feet much of the day as she did not have any help at home.  She states that since then her pain has increased.  The joint has a mild effusion and slight warmth to it today. Her incision is healing well and I cannot appreciate drainage. It is not dehisced.  It is moderately but not exquisitely tender and she does not have a fever.  Given history, exam, vitals, I do not suspect a septic joint but discussed precautions. I do expect she irritated it over the weekend. I will assess for leukocytosis on CBC.  She still has some oxycodone that helps somewhat.  She missed her orthopedic follow-up as she was hospitalized recently. - Continue conservative treatment for now, I have asked her to stay off her feet to recover, she has help at home and now - Encouraged her to pursue quick follow-up with her orthopedist since she missed her previous appointment

## 2023-07-23 NOTE — Progress Notes (Signed)
 CC: HFU anemia  HPI:  [name] is a 58 y.o. female living with a history stated below and presents today for hospital follow-up. Please see problem based assessment and plan for additional details.  Past Medical History:  Diagnosis Date   Adnexal mass 2019   likely remnant fibroid on broad ligament; getting diagnostic lap   Anxiety    on meds   Arthritis    hip-uses OTC meds   Asthma    uses inhaler as needed   Carpal tunnel syndrome    bilateral   Chest wall pain 10/15/2016   Chronic lower back pain    COPD (chronic obstructive pulmonary disease) (HCC)    uses in haler as needed   Depression    on meds   Dyspnea    with exertion   Dysrhythmia    GERD (gastroesophageal reflux disease)    Headache    migraines   Herpes    Hypertension    on meds   Liver lesion, right lobe 12/01/2015   CT chest without contrast 11/30/15 with hepatic lesions measuring 2.0 x 1.7 cm of the right lobe and 1 x 1 cm on the dome. CT abd/pel 8/18: 1.3cm inferior R liver lobe, prob benign but recommend 3-48mo f/u with MRI w/wo IV contrast with attenuation MRI 6/19: combination of benign cavernous hemangiomas and small benign cysts   PONV (postoperative nausea and vomiting)    Poor dental hygiene    chipped upper front and several loose teeth   Seasonal allergies    Sickle cell trait (HCC)    Substance abuse (HCC)    cocaine/crack cocaine - last use 02/07/18   SVD (spontaneous vaginal delivery)    x 1   Tobacco abuse 07/08/2015   Uses walker    for ambulation   Uterine leiomyoma 02/16/2016   s/p vaginal hysterectomy 2018   Vaginal discharge 03/26/2018    Review of Systems: ROS negative except for what is noted on the assessment and plan.  Vitals:   07/23/23 0900  BP: 124/80  Pulse: 87  Resp: (!) 32  Temp: 98 F (36.7 C)  TempSrc: Oral  SpO2: 100%  Weight: 128 lb 8 oz (58.3 kg)  Height: 5\' 4"  (1.626 m)   Physical Exam: Constitutional: well-appearing woman sitting in  wheelchair in no acute distress HENT: normocephalic atraumatic, mucous membranes moist Eyes: conjunctiva non-erythematous Cardiovascular: regular rate and rhythm, no m/r/g Pulmonary/Chest: normal work of breathing on room air, lungs clear to auscultation bilaterally Abdominal: soft, non-tender, non-distended MSK: normal bulk and tone. R knee is S/p replacement - incision intact without erythema or drainage, there is a mild effusion on the knee and it is somewhat warm with global tenderness but there is no skin change. Neurological: alert & oriented x 3, no focal deficit Skin: warm and dry Psych: normal mood and behavior  Assessment & Plan:   Patient discussed with Dr.  Deirdre Priest  Symptomatic anemia HFU for this issue. She was admitted with downtrending Hg as low as 8.6 and report of melena. GI workup unrevealing but limited by poor bowel prep for colonoscopy. There remains suspicion of occult GI bleed exacerbated by her recent knee replacement procedure.  She is not taking NSAIDs.  She noticed 1 dark near black stool 2 days ago that returned to normal brown.  No tarry stools or overt blood.  No N/V, pain, dizziness, chills - she is asymptomatic. I will refill her Protonix and start iron, give instructions to  avoid irritating medicines.  I will refer her to GI.  In the hospital, they were considering repeat scope versus pill endoscopy. - Protonix 40 daily - Refer to GI - CBC today - Iron replacement to rebuild and check iron stores  Status post total right knee replacement Her knee was replaced 3 weeks ago.  Saturday, 3 days ago, she was on her feet much of the day as she did not have any help at home.  She states that since then her pain has increased.  The joint has a mild effusion and slight warmth to it today. Her incision is healing well and I cannot appreciate drainage. It is not dehisced.  It is moderately but not exquisitely tender and she does not have a fever.  Given history, exam,  vitals, I do not suspect a septic joint but discussed precautions. I do expect she irritated it over the weekend. I will assess for leukocytosis on CBC.  She still has some oxycodone that helps somewhat.  She missed her orthopedic follow-up as she was hospitalized recently. - Continue conservative treatment for now, I have asked her to stay off her feet to recover, she has help at home and now - Encouraged her to pursue quick follow-up with her orthopedist since she missed her previous appointment  RTC in 3 months for chronic disease follow up.  [name], D.O. [facility] Internal Medicine, PGY-1 Phone: [phone] Date 07/23/2023 Time 3:42 PM

## 2023-07-23 NOTE — Patient Instructions (Signed)
 Please call your orthopedist and schedule an appointment ASAP.  If you measure fevers at home above 100.4 degrees or if the swelling gets much worse, call your orthopedist. Call us if you cannot get in touch with them.  I will set up an appointment with gastroenterology. They need to redo your colonoscopy to assess for bleeding.

## 2023-07-24 ENCOUNTER — Telehealth: Payer: Self-pay | Admitting: Orthopaedic Surgery

## 2023-07-24 ENCOUNTER — Telehealth: Payer: Self-pay | Admitting: Student

## 2023-07-24 ENCOUNTER — Other Ambulatory Visit: Payer: Self-pay | Admitting: Student

## 2023-07-24 LAB — CBC
Hematocrit: 28.7 % — ABNORMAL LOW (ref 34.0–46.6)
Hemoglobin: 9.7 g/dL — ABNORMAL LOW (ref 11.1–15.9)
MCH: 31.4 pg (ref 26.6–33.0)
MCHC: 33.8 g/dL (ref 31.5–35.7)
MCV: 93 fL (ref 79–97)
Platelets: 605 10*3/uL — ABNORMAL HIGH (ref 150–450)
RBC: 3.09 x10E6/uL — ABNORMAL LOW (ref 3.77–5.28)
RDW: 12.1 % (ref 11.7–15.4)
WBC: 6.6 10*3/uL (ref 3.4–10.8)

## 2023-07-24 LAB — IRON AND TIBC
Iron Saturation: 15 % (ref 15–55)
Iron: 43 ug/dL (ref 27–159)
Total Iron Binding Capacity: 281 ug/dL (ref 250–450)
UIBC: 238 ug/dL (ref 131–425)

## 2023-07-24 LAB — FERRITIN: Ferritin: 570 ng/mL — ABNORMAL HIGH (ref 15–150)

## 2023-07-24 NOTE — Telephone Encounter (Unsigned)
 Copied from CRM [id number]. Topic: Complaint (DO NOT CONVERT) - Provider (sensitive) >> Jul 24, 2023  2:13 PM Hamdi H wrote: Date of Incident: 07/18/23 Details of complaint: She believes the provider fractured her bones. While he was taking fluid from her knee the needle fractured. She is now scared to go to the ER.  How would the patient like to see it resolved? She is still in pain and suffering. She wants compensation for her pain and suffering.  On a scale of 1-10, how was your experience? 1 What would it take to bring it to a 10? Compensation   Route to Research officer, political party.

## 2023-07-24 NOTE — Telephone Encounter (Signed)
 Copied from CRM [id number]. Topic: Clinical - Medication Refill >> Jul 24, 2023  2:08 PM Hamdi H wrote: Most Recent Primary Care Visit:  Provider: Modena Slater  Department: [facility]  Visit Type: OPEN ESTABLISHED  Date: 06/05/2023  Medication: oxyCODONE (ROXICODONE) 5 MG immediate release tablet  gabapentin (NEURONTIN) 300 MG capsule  Has the patient contacted their pharmacy? Yes (Agent: If no, request that the patient contact the pharmacy for the refill. If patient does not wish to contact the pharmacy document the reason why and proceed with request.) (Agent: If yes, when and what did the pharmacy advise?)  Is this the correct pharmacy for this prescription? Yes If no, delete pharmacy and type the correct one.  This is the patient's preferred pharmacy:  [facility] [phone] [place], [place] - [address] [place] [address] Phone: [phone] Fax: [phone]   Has the prescription been filled recently? No  Is the patient out of the medication? Yes down to the last one for both  Has the patient been seen for an appointment in the last year OR does the patient have an upcoming appointment? Yes  Can we respond through MyChart? No  Agent: Please be advised that Rx refills may take up to 3 business days. We ask that you follow-up with your pharmacy.

## 2023-07-24 NOTE — Telephone Encounter (Signed)
 Pt called for refill for pain medication. Pt states pharmacy only gave 3 day supply. Please send to pharmacy on file. Pt phone number is [phone].

## 2023-07-24 NOTE — Telephone Encounter (Signed)
 Pt aware 5mg  called in for her

## 2023-07-25 NOTE — Telephone Encounter (Signed)
 REfiil for the Oxycodone was 07/24/2023.

## 2023-07-25 NOTE — Progress Notes (Signed)
.  attestn

## 2023-07-26 ENCOUNTER — Ambulatory Visit: Payer: Medicaid Other | Admitting: Licensed Clinical Social Worker

## 2023-07-26 ENCOUNTER — Other Ambulatory Visit: Payer: Self-pay | Admitting: Licensed Clinical Social Worker

## 2023-07-26 MED ORDER — GABAPENTIN 300 MG PO CAPS: ORAL_CAPSULE | ORAL | 1 refills | Status: DC

## 2023-07-26 NOTE — Patient Instructions (Signed)
  Visit Information  Thank you for taking time to visit with me today. Please don't hesitate to contact me if I can be of assistance to you.   Following are the goals we discussed today:   Goals Addressed             This Visit's Progress    Patient Stated       Care Coordination Interventions: Assessed Social Determinants of Health Reviewed all upcoming appointments in Epic system Motivational Interviewing employed Solution-Focused Strategies employed:  Mindfulness or Relaxation training provided Active listening / Reflection utilized  Emotional Support Provided Problem Solving /Task Center strategies reviewed Attend all upcoming doctors appointment, reschedule [facility] appointments if you are unable to attend [facility] Transportation for transport needs if unable to find friends/family who can give a ride. Utilize coping skills as needed to reduce symptoms of anxiety/depression          Our next appointment is by telephone on 08/01/2023.  Please call the care guide team at [phone] if you need to cancel or reschedule your appointment.   If you are experiencing a Mental Health or Behavioral Health Crisis or need someone to talk to, please call the Suicide and Crisis Lifeline: 988  Patient verbalizes understanding of instructions and care plan provided today and agrees to view in MyChart. Active MyChart status and patient understanding of how to access instructions and care plan via MyChart confirmed with patient.     Telephone follow up appointment with care management team member scheduled for: 08/01/2023

## 2023-07-26 NOTE — Patient Outreach (Signed)
 Care Coordination   Initial Visit Note   07/26/2023 Name: Jean [name] MRN: [id number] DOB: 03-Feb-1966  Jean [name] is a 58 y.o. year old female who sees [name], DO for primary care. I spoke with  [name] by phone today.  What matters to the patients health and wellness today?  Managing my care after having knee surgery.    Goals Addressed             This Visit's Progress    Patient Stated       Care Coordination Interventions: Assessed Social Determinants of Health Reviewed all upcoming appointments in Epic system Motivational Interviewing employed Solution-Focused Strategies employed:  Mindfulness or Relaxation training provided Active listening / Reflection utilized  Emotional Support Provided Problem Solving /Task Center strategies reviewed Attend all upcoming doctors appointment, reschedule [facility] appointments if you are unable to attend [facility] Transportation for transport needs if unable to find friends/family who can give a ride. Utilize coping skills as needed to reduce symptoms of anxiety/depression          SDOH assessments and interventions completed:  Yes     Care Coordination Interventions:  Yes, provided   Interventions Today    Flowsheet Row Most Recent Value  Chronic Disease   Chronic disease during today's visit Other  [SDOH]  General Interventions   General Interventions Discussed/Reviewed [employer], General Interventions Discussed  [Pt reported that she has started with home health for PT/OT. Pt is unable to drive at this time, we reviewed transportation options and pt is aware of Medicaid transportation and how to use them. Pt denied any needs regarding food resources.]  Mental Health Interventions   Mental Health Discussed/Reviewed Mental Health Discussed, Mental Health Reviewed, Coping Strategies  [Pt has MH appt scheduled for 4/2 and 4/3. Pt reported these are virtual and she will attend them. Pt  reported some stuggles w/depression since knee surgery. Pt uses her faith and her family to help cope. CSW provided support and normalized her feelings.]        Follow up plan: Follow up call scheduled for 08/01/2023    Encounter Outcome:  Patient Visit Completed   Kenton Kingfisher, LCSW [place], [facility] Licensed Clinical Social Worker Care Coordinator [phone]

## 2023-07-29 ENCOUNTER — Other Ambulatory Visit: Payer: Self-pay | Admitting: Student

## 2023-07-29 ENCOUNTER — Encounter: Payer: Self-pay | Admitting: Physician Assistant

## 2023-07-29 ENCOUNTER — Ambulatory Visit (INDEPENDENT_AMBULATORY_CARE_PROVIDER_SITE_OTHER): Admitting: Physician Assistant

## 2023-07-29 DIAGNOSIS — D649 Anemia, unspecified: Secondary | ICD-10-CM

## 2023-07-29 DIAGNOSIS — Z96651 Presence of right artificial knee joint: Secondary | ICD-10-CM

## 2023-07-29 MED ORDER — GABAPENTIN 300 MG PO CAPS: ORAL_CAPSULE | ORAL | 1 refills | Status: DC

## 2023-07-29 MED ORDER — OXYCODONE HCL 5 MG PO TABS: 5.0000 mg | ORAL_TABLET | Freq: Four times a day (QID) | ORAL | 0 refills | Status: DC | PRN

## 2023-07-29 NOTE — Telephone Encounter (Signed)
 Medication last refilled 07/23/23

## 2023-07-29 NOTE — Progress Notes (Signed)
 HPI: Mrs. Fehr returns today status post right total knee arthroplasty 07/02/2023.  She is been hospitalized since her right total knee due to symptomatic anemia and gastritis.  She is working with formal therapy at home for range of motion strengthening knee.  Staples were harvested during her hospital stay for the gastritis and anemia.  She is taking oxycodone for the pain.  She is on gabapentin due to some neuropathic leg pain about the knee.  She is on aspirin twice daily for DVT prophylaxis.  She was on 81 mg aspirin once daily prior to surgery.  Review of systems: Negative for fevers chills.  Physical exam: General Well-developed well-nourished female is an antalgic gait with use of a walker. Respirations: Nonlabored Right knee: Surgical incisions healing well no signs of infection.  Lacks about 5 degrees in full extension and flexes to 100 degrees.  No instability valgus varus stressing.  Calf supple nontender.  Dorsiflexion plantarflexion right ankle intact.  Impression: Status post right total knee arthroplasty  Plan: Recommend she continue to work on range of motion strengthening will set her up for outpatient physical therapy for her right knee to work on range of motion strengthening home exercises program and modalities.  She can go back on a regular 81 mg aspirin.  Refill on her oxycodone and Neurontin were given today.  She will follow-up with [place] in just 3 weeks to see how she is doing overall.  Questions were encouraged and answered.

## 2023-07-29 NOTE — Addendum Note (Signed)
 Addended by: Mardene Celeste B on: 07/29/2023 03:15 PM   Modules accepted: Orders

## 2023-07-30 ENCOUNTER — Other Ambulatory Visit: Payer: Self-pay

## 2023-07-30 NOTE — Patient Outreach (Signed)
 BSW attempted to complete outreach. Patient stated she was sleeping and asked for a call back later.     Abelino Derrick, MHA [facility]  Managed [facility] Social Worker [phone]

## 2023-07-30 NOTE — Patient Instructions (Signed)
 Visit Information  Jean [name] was given information about Medicaid Managed Care team care coordination services as a part of their [facility] Community Plan Medicaid benefit. Jean [name] verbally consented to engagement with the [facility] Managed Care team.   If you are experiencing a medical emergency, please call 911 or report to your local emergency department or urgent care.   If you have a non-emergency medical problem during routine business hours, please contact your provider's office and ask to speak with a nurse.   For questions related to your [facility], please call: [phone] or visit the homepage here: [email or web address]  If you would like to schedule transportation through your [facility], please call the following number at least 2 days in advance of your appointment: [phone]   Rides for urgent appointments can also be made after hours by calling Member Services.  Call the Behavioral Health Crisis Line at [phone], at any time, 24 hours a day, 7 days a week. If you are in danger or need immediate medical attention call 911.  If you would like help to quit smoking, call 1-800-QUIT-NOW (260-234-4500) OR Espaol: 1-855-Djelo-Ya (1-324-401-0272) o para ms informacin haga clic aqu or Text READY to 536-644 to register via text  Ms. Brickner - following are the goals we discussed in your visit today:   Goals Addressed   None       Social Worker will follow up in 15 days.   Gus Puma, Kenard Gower, MHA [facility]  Managed Medicaid Social Worker [phone]   Following is a copy of your plan of care:  There are no care plans that you recently modified to display for this patient.

## 2023-07-30 NOTE — Patient Outreach (Signed)
 Medicaid Managed Care Social Work Note  07/30/2023 Name:  Jean [name] MRN:  [id number] DOB:  17-Nov-1965  Jean [name] is an 58 y.o. year old female who is a primary patient of Jean James, DO.  The [facility] Managed Care Coordination team was consulted for assistance with:  Food Insecurity  [name] was given information about Medicaid Managed Care Coordination team services today. Jean [name] Patient agreed to services and verbal consent obtained.  Engaged with patient  for by telephone forinitial visit in response to referral for case management and/or care coordination services.   Patient is participating in a Managed Medicaid Plan:  Yes  Assessments/Interventions:  Review of past medical history, allergies, medications, health status, including review of consultants reports, laboratory and other test data, was performed as part of comprehensive evaluation and provision of chronic care management services.  SDOH: (Social Drivers of Health) assessments and interventions performed: SDOH Interventions    Flowsheet Row Office Visit from 07/23/2023 in [facility] [place] Patient Outreach Telephone from 07/19/2023 in [place] POPULATION HEALTH DEPARTMENT ED to Hosp-Admission (Discharged) from 07/11/2023 in [facility] 38M KIDNEY UNIT Admission (Discharged) from 07/02/2023 in [facility] NORTH ORTHOPEDICS Patient Outreach Telephone from 06/20/2023 in [place] POPULATION HEALTH DEPARTMENT Patient Outreach Telephone from 06/13/2023 in [place] POPULATION HEALTH DEPARTMENT  SDOH Interventions        Food Insecurity Interventions -- -- -- Other (Comment), Inpatient TOC  [food pantry list provided] -- [employer] Provided  Housing Interventions -- -- -- -- -- Intervention Not Indicated  Transportation Interventions -- -- Inpatient TOC, Intervention Not Indicated, Patient Resources  Dietitian) -- -- --  Utilities Interventions -- -- -- Inpatient TOC, Intervention Not Indicated -- Intervention Not Indicated  Alcohol Usage Interventions -- -- -- -- Intervention Not Indicated (Score <7) --  Depression Interventions/Treatment  Medication -- -- -- -- --  Financial Strain Interventions -- -- -- -- -- Programmer, applications Provided  Physical Activity Interventions -- Intervention Not Indicated  [not physically able to-s/p right TKA 2/25] -- -- -- --  Stress Interventions -- -- -- -- -- Programmer, applications Provided, Provide Counseling  Social Connections Interventions -- Intervention Not Indicated -- -- -- --  Health Literacy Interventions -- -- -- -- Intervention Not Indicated --     BSW completed a telephone outreach with patient, she states she needs resources for food. She does receive foodstamps but it is not enough. Patient does have income receiving disability each month. She usually goes to [employer] but since her surgery cannot get out of the home. Patient states she does have some family members that can go to the food pantries for her. BSW will complete a referral for one step further and mail additional food resources. No other resources are needed at this time.   Advanced Directives Status:  Not addressed in this encounter.  Care Plan                 Allergies  Allergen Reactions   Lisinopril Swelling and Other (See Comments)    Lip swelling, patient reported - unable to confirm from chart     Medications Reviewed Today   Medications were not reviewed in this encounter     Patient Active Problem List   Diagnosis Date Noted   Hemarthrosis 07/12/2023   Melena 07/12/2023   Occult blood in stools 07/12/2023   Gastritis determined  by endoscopy 07/12/2023   Symptomatic anemia 07/11/2023   Status post total right knee replacement 07/02/2023   Osteoarthritis of right knee 06/05/2023   Food insecurity 06/05/2023   Bilateral shoulder pain 11/29/2022    Dysphagia 11/29/2022   History of cardioembolic cerebrovascular accident (CVA) 11/29/2022   Dizziness 06/19/2022   Constipation 05/09/2021   Weight loss 05/09/2021   Headache    Symptomatic bradycardia 04/18/2021   Near syncope 04/18/2021   Synovitis of left knee 07/26/2020   COVID-19 virus infection 05/11/2020   Low back pain 11/24/2019   Impingement syndrome of right shoulder 04/22/2019   History of dental surgery 02/05/2019   COPD GOLD 0 ? 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   Follow up:  Patient agrees to Care Plan and Follow-up.  Plan: The Managed Medicaid care management team will reach out to the patient again over the next 15 days.  Date/time of next scheduled Social Work care management/care coordination outreach:  08/20/23  Jean [name], Jean [name], [facility]  Managed [facility] Social Worker [phone]

## 2023-07-31 ENCOUNTER — Telehealth: Payer: Self-pay | Admitting: Orthopaedic Surgery

## 2023-07-31 NOTE — Telephone Encounter (Signed)
 Verbal order given

## 2023-07-31 NOTE — Telephone Encounter (Signed)
 Terry with [facility] states the pt is request a few moore weeks of home health before she transition to out patient therapy. Frequency of 2 week 2 or 2 week 3     Terry's call back number [phone]

## 2023-08-01 ENCOUNTER — Ambulatory Visit: Payer: Self-pay | Admitting: Licensed Clinical Social Worker

## 2023-08-01 NOTE — Patient Instructions (Signed)
 Marland Kitchen

## 2023-08-01 NOTE — Patient Outreach (Signed)
 Care Coordination   Follow Up Visit Note   08/01/2023 Name: Jean [name] MRN: [id number] DOB: 22-Feb-1966  Jean [name] is a 58 y.o. year old female who sees Jean James, DO for primary care. I spoke with  Jean [name] by phone today.  What matters to the patients health and wellness today?  Improving my mobility and regaining independence.   Goals Addressed   None     SDOH assessments and interventions completed:  Yes     Care Coordination Interventions:  Yes, provided   Interventions Today    Flowsheet Row Most Recent Value  Chronic Disease   Chronic disease during today's visit Other  [Depression]  General Interventions   General Interventions Discussed/Reviewed [employer], General Interventions Discussed  [Pt continues to particpate in PT and reports she is starting to see some improvement. PT was reorder to continue for an additional 2-3 weeks. Pt denied any resources needs at this time.]  Mental Health Interventions   Mental Health Discussed/Reviewed Mental Health Discussed, Mental Health Reviewed, Coping Strategies  [Pt reported her MH symptoms have improved as her physical condition has gotten better. Pt continues to lean on family and her faith to help her cope. Pt plans on attending upcoming MH appts on 4/2, 4/3. CSW provided emotional support.]        Follow up plan: Follow up call scheduled for 08/15/2023    Encounter Outcome:  Patient Visit Completed   Kenton Kingfisher, LCSW /[facility], [facility] Licensed Clinical Social Worker Care Coordinator [phone]

## 2023-08-02 NOTE — Progress Notes (Signed)
 Internal Medicine Clinic Attending  Case discussed with the resident at the time of the visit.  We reviewed the resident's history and exam and pertinent patient test results.  I agree with the assessment, diagnosis, and plan of care documented in the resident's note.

## 2023-08-05 ENCOUNTER — Telehealth: Payer: Self-pay | Admitting: Physician Assistant

## 2023-08-05 ENCOUNTER — Other Ambulatory Visit: Payer: Self-pay | Admitting: Physician Assistant

## 2023-08-05 MED ORDER — OXYCODONE HCL 5 MG PO TABS: 5.0000 mg | ORAL_TABLET | Freq: Four times a day (QID) | ORAL | 0 refills | Status: DC | PRN

## 2023-08-05 NOTE — Telephone Encounter (Signed)
 Jean [name] pt

## 2023-08-05 NOTE — Telephone Encounter (Signed)
 Patient called and said she need a refill on pain medication. CB#[phone]

## 2023-08-07 ENCOUNTER — Encounter (HOSPITAL_COMMUNITY): Payer: Self-pay

## 2023-08-07 ENCOUNTER — Encounter (HOSPITAL_COMMUNITY): Payer: Medicaid Other | Admitting: Psychiatry

## 2023-08-07 NOTE — Progress Notes (Signed)
 Patient did not connect for virtual psychiatric medication management appointment on 08/07/23 at 10aM. Sent secure video link with no response. Called phone and patient answered reporting she is currently at the [employer]ressers and will be there for the next hour; unable to conduct appointment today.  She was made aware of therapy appointment tomorrow at 11AM by video and expresses intent to attend this appointment. Notably, she expresses confusion about referral and unsure what services she is in need of. Discussed that need for services including therapy and/or medication management can be further explored tomorrow.  Daine Gip, MD 08/07/23

## 2023-08-08 ENCOUNTER — Encounter (HOSPITAL_COMMUNITY): Payer: Self-pay

## 2023-08-08 ENCOUNTER — Ambulatory Visit (HOSPITAL_COMMUNITY): Payer: Self-pay | Admitting: Licensed Clinical Social Worker

## 2023-08-12 ENCOUNTER — Other Ambulatory Visit: Payer: Self-pay | Admitting: Orthopaedic Surgery

## 2023-08-12 ENCOUNTER — Telehealth: Payer: Self-pay | Admitting: Physician Assistant

## 2023-08-12 MED ORDER — OXYCODONE HCL 5 MG PO TABS: 5.0000 mg | ORAL_TABLET | Freq: Four times a day (QID) | ORAL | 0 refills | Status: DC | PRN

## 2023-08-12 NOTE — Telephone Encounter (Signed)
 Patient called. She would like a refill on her pain medication.

## 2023-08-15 ENCOUNTER — Ambulatory Visit: Payer: Self-pay | Admitting: Student

## 2023-08-15 NOTE — Telephone Encounter (Signed)
 Pt stated she went to the ER  Oct 13,2024 and Dr Lyman Speller drained and suctioned her knee and it was very painful. So she went to see her Ortho doctor 2 weeks later, MRI was done which showed a bone fx. And she had a TKR in February. I asked what she needed from our office; stated she need to know who to file the claim against; stated she's filing  a claim for pain and suffering. Sending to the Engineer, manufacturing.

## 2023-08-15 NOTE — Telephone Encounter (Signed)
 Please message below:   Copied from CRM [id number]. Topic: Complaint (DO NOT CONVERT) - Care >> Aug 15, 2023 11:51 AM Everette Rank wrote: Date of Incident: 07/18/23 Details of complaint: She believes the provider fractured her bones. While he was taking fluid from her knee the needle fractured. She is now scared to go to the ER.  Patient needs call back on update from this issue. Did get transferred to [facility] on 03/20 but no update after this.    How would the patient like to see it resolved? She is still in pain and suffering. She wants compensation for her pain and suffering.  On a scale of 1-10, how was your experience? 1 What would it take to bring it to a 10? Compensation

## 2023-08-15 NOTE — Telephone Encounter (Signed)
 I called pt - no answer; left message on vm of office's return call.

## 2023-08-15 NOTE — Telephone Encounter (Signed)
 Called pt again for more details about her call - no answer; left message on vm of office's call back.

## 2023-08-16 ENCOUNTER — Telehealth: Payer: Self-pay | Admitting: *Deleted

## 2023-08-19 ENCOUNTER — Telehealth: Payer: Self-pay | Admitting: Orthopaedic Surgery

## 2023-08-19 ENCOUNTER — Other Ambulatory Visit: Payer: Self-pay | Admitting: Orthopaedic Surgery

## 2023-08-19 ENCOUNTER — Other Ambulatory Visit: Payer: Self-pay | Admitting: Student

## 2023-08-19 ENCOUNTER — Telehealth: Payer: Self-pay | Admitting: *Deleted

## 2023-08-19 DIAGNOSIS — D649 Anemia, unspecified: Secondary | ICD-10-CM

## 2023-08-19 MED ORDER — GABAPENTIN 300 MG PO CAPS: ORAL_CAPSULE | ORAL | 1 refills | Status: DC

## 2023-08-19 MED ORDER — OXYCODONE HCL 5 MG PO TABS: 5.0000 mg | ORAL_TABLET | Freq: Four times a day (QID) | ORAL | 0 refills | Status: DC | PRN

## 2023-08-19 NOTE — Telephone Encounter (Signed)
 Copied from CRM [id number]. Topic: Complaint (DO NOT CONVERT) - Care >> Aug 15, 2023 11:51 AM Retta Caster wrote: Date of Incident: 07/18/23 Details of complaint: She believes the provider fractured her bones. While he was taking fluid from her knee the needle fractured. She is now scared to go to the ER.  Patient needs call back on update from this issue. Did get transferred to [facility] on 03/20 but no update after this.    How would the patient like to see it resolved? She is still in pain and suffering. She wants compensation for her pain and suffering.  On a scale of 1-10, how was your experience? 1 What would it take to bring it to a 10? Compensation >> Aug 19, 2023 11:43 AM Jayson Michael wrote: Patient called Patient called requesting to speak with [facility]. Advised patient that Fabiana Dromgoole is currently assisting other patients and the clinic is preparing for lunch, so she may not be able to call right away. Reassured patient that Stevens Eland will return her call as soon as she is available. Patient is following up to check for any updates regarding her complaint. Patient callback  [phone] >> Aug 15, 2023  4:20 PM Valeri Gate H wrote: Patient called back. Transferred her to the clinic to speak with Mrs. Stevens Eland.

## 2023-08-19 NOTE — Telephone Encounter (Signed)
 Called pt - advised to call the main hospital # for Service Excellence.

## 2023-08-19 NOTE — Telephone Encounter (Signed)
 Patient called and said she needs a refill on OxyContin, Gabapentin,the medication for indigestion and the medication she had got at the hospital to stop constipation. CB#[phone]

## 2023-08-19 NOTE — Telephone Encounter (Signed)
 Patient aware of the below message

## 2023-08-26 ENCOUNTER — Encounter: Payer: Self-pay | Admitting: Physician Assistant

## 2023-08-26 ENCOUNTER — Ambulatory Visit (INDEPENDENT_AMBULATORY_CARE_PROVIDER_SITE_OTHER): Admitting: Physician Assistant

## 2023-08-26 DIAGNOSIS — Z96651 Presence of right artificial knee joint: Secondary | ICD-10-CM

## 2023-08-26 MED ORDER — OXYCODONE HCL 5 MG PO TABS: 5.0000 mg | ORAL_TABLET | Freq: Four times a day (QID) | ORAL | 0 refills | Status: DC | PRN

## 2023-08-26 NOTE — Progress Notes (Signed)
 Complex Care Management Care Guide Note  08/26/2023 Name: Jean [name] MRN: [id number] DOB: [birth year]  Jean [name] is a 58 y.o. year old female who is a primary care patient of Carleen Chary, DO and is actively engaged with the care management team. I reached out to Berline Brenner by phone today to assist with re-scheduling  with the RN Case Manager.  Follow up plan: Unsuccessful telephone outreach attempt made. A HIPAA compliant phone message was left for the patient providing contact information and requesting a return call.  Barnie Bora  [facility]  Value-Based Care Institute, [facility] Guide  Direct Dial: [phone]  Fax [phone]

## 2023-08-26 NOTE — Progress Notes (Signed)
 HPI: Mrs. Ganger returns today status post right total knee arthroplasty now 8 weeks postop.  She is overall doing well.  She still has sharp pain she describes as a burning and occurs occasionally.  When these pains occur her pain is 8 out of 10.  She is on gabapentin  feels it is not beneficial.  However it does make her sleepy and she is unable to take it except for bedtime.  Asking for refill on oxycodone .  She is ready at this point in time to start outpatient therapy.  She states she finished home health PT on Wednesday.  Review of systems: Denies fevers or chills.  Physical exam: Right knee: 510 degrees of flexion.  No instability valgus varus stressing.  Surgical incisions well-healed.  Calf supple nontender.  Impression: Status post right total knee arthroplasty  Plan: Set her up for outpatient therapy work on range of motion strengthening also include home exercise program.  Refill Lovenox  was given she understands that she will need to go to Norco at next refill.  Continue with scar tissue mobilization.  Follow-up 1 month sooner if there is any questions concerns.

## 2023-08-27 NOTE — Progress Notes (Signed)
 Complex Care Management Care Guide Note  08/27/2023 Name: Jean [name] MRN: [id number] DOB: Jun 21, 1965  Jean [name] is a 58 y.o. year old female who is a primary care patient of Carleen Chary, DO and is actively engaged with the care management team. I reached out to Berline Brenner by phone today to assist with re-scheduling  with the RN Case Manager.  Follow up plan: Unsuccessful telephone outreach attempt made. A HIPAA compliant phone message was left for the patient providing contact information and requesting a return call. No further outreach attempts will be made at this time. We have been unable to contact the patient to reschedule for complex care management services.   Barnie Bora  [facility]  Value-Based Care Institute, [facility] Guide  Direct Dial: [phone]  Fax [phone]

## 2023-08-30 ENCOUNTER — Other Ambulatory Visit: Payer: Self-pay | Admitting: Orthopaedic Surgery

## 2023-08-30 ENCOUNTER — Other Ambulatory Visit: Payer: Self-pay | Admitting: Physician Assistant

## 2023-08-30 ENCOUNTER — Telehealth: Payer: Self-pay | Admitting: Physician Assistant

## 2023-08-30 MED ORDER — HYDROCODONE-ACETAMINOPHEN 7.5-325 MG/15ML PO SOLN: 15.0000 mL | Freq: Four times a day (QID) | ORAL | 0 refills | Status: DC | PRN

## 2023-08-30 MED ORDER — OXYCODONE HCL 5 MG PO TABS
5.0000 mg | ORAL_TABLET | Freq: Three times a day (TID) | ORAL | 0 refills | Status: DC | PRN
Start: 2023-08-30 — End: 2023-08-30

## 2023-08-30 NOTE — Telephone Encounter (Signed)
Patient would like a refill on oxycodone  

## 2023-09-05 ENCOUNTER — Other Ambulatory Visit: Payer: Self-pay | Admitting: Student

## 2023-09-05 DIAGNOSIS — D649 Anemia, unspecified: Secondary | ICD-10-CM

## 2023-09-06 ENCOUNTER — Other Ambulatory Visit: Payer: Self-pay | Admitting: Orthopaedic Surgery

## 2023-09-06 ENCOUNTER — Telehealth: Payer: Self-pay | Admitting: Physician Assistant

## 2023-09-06 MED ORDER — HYDROCODONE-ACETAMINOPHEN 7.5-325 MG/15ML PO SOLN: 15.0000 mL | Freq: Three times a day (TID) | ORAL | 0 refills | Status: DC | PRN

## 2023-09-06 NOTE — Telephone Encounter (Signed)
 Rx refill Hydrocodone       [facility] at [address]

## 2023-09-09 ENCOUNTER — Other Ambulatory Visit: Payer: Self-pay

## 2023-09-09 ENCOUNTER — Telehealth: Payer: Self-pay | Admitting: Orthopaedic Surgery

## 2023-09-09 ENCOUNTER — Other Ambulatory Visit: Payer: Self-pay | Admitting: Orthopaedic Surgery

## 2023-09-09 ENCOUNTER — Ambulatory Visit: Payer: Self-pay | Admitting: Student

## 2023-09-09 DIAGNOSIS — F320 Major depressive disorder, single episode, mild: Secondary | ICD-10-CM

## 2023-09-09 DIAGNOSIS — Z96651 Presence of right artificial knee joint: Secondary | ICD-10-CM

## 2023-09-09 MED ORDER — HYDROCODONE-ACETAMINOPHEN 7.5-325 MG PO TABS: 1.0000 | ORAL_TABLET | Freq: Four times a day (QID) | ORAL | 0 refills | Status: AC | PRN

## 2023-09-09 MED ORDER — SERTRALINE HCL 25 MG PO TABS: 25.0000 mg | ORAL_TABLET | Freq: Every day | ORAL | 3 refills | Status: AC

## 2023-09-09 NOTE — Telephone Encounter (Signed)
 Pt called stating she did not pick up the hydrocodone  because she never took those before and want the oxycodone . Please call pt about this matter at [phone]. Pt also asked about outpatient therapy. She states she is still waiting for someone to call. She finished inpatient rehab. Please call pt about these matters.

## 2023-09-09 NOTE — Telephone Encounter (Signed)
 Patient called. Would like Jean [name] to call her concerning her medication. Her cb# [phone]

## 2023-09-09 NOTE — Telephone Encounter (Signed)
  Chief Complaint: Refill request Symptoms: pain Frequency: constant Pertinent Negatives: Patient denies new symptoms Disposition: [] ED /[] Urgent Care (no appt availability in office) / [x] Appointment(In office/virtual)/ []  [facility]/ [] Home Care/ [x] Refused Recommended Disposition /[] [place] [facility]/ []  Follow-up with PCP Additional Notes:  She called on Friday for Oxycodone  5mg  pill but was sent liquid hycet, she does not want that as it makes her feel "loopy". She is requesting to have oxycodone  5[facility]  Right leg pain. She is having trouble ambulating due to pain, using bedside commode due to pain. She starts PT on Wednesday. She has not taken any pain relief over this weekend and now pain radiates to pelvis and foot. Also requesting Sertraline  refill. Educated on care advice as documented in protocol, patient verbalized understanding. Patient requests call back from clinic staff regarding "wrong" medication sent to pharmacy.      Copied from CRM [id number]. Topic: Clinical - Red Word Triage >> Sep 09, 2023  1:41 PM Brittney F wrote: [facility] that prompted transfer to Nurse Triage:  Pain from pelvic bone to foot (right side) Reason for Disposition  [1] MODERATE pain (e.g., interferes with normal activities, limping) AND [2] present > 3 days    Rates as severe, has not tried any medications.  Additional Information  Negative: [1] SEVERE pain (e.g., excruciating, unable to do any normal activities) AND [2] not improved after 2 hours of pain medicine    Out of medication since Friday  Protocols used: Leg Pain-A-AH

## 2023-09-09 NOTE — Telephone Encounter (Signed)
LMOM for patient of the below message  

## 2023-09-09 NOTE — Telephone Encounter (Signed)
 Patient called stating she can't take hydrocodone  liquid, it makes her feel "out of it" and I told her that was a mistake and that he sent in the oral pills  She was asking for oxycodone , that this works for her and I told her we can no longer keep giving her this since she is 2+ months out from surgery

## 2023-09-10 NOTE — Telephone Encounter (Signed)
 Called pt about her request for Oxycodone  - no answer; left message to call the office.

## 2023-09-11 ENCOUNTER — Other Ambulatory Visit: Payer: Self-pay | Admitting: Orthopaedic Surgery

## 2023-09-11 ENCOUNTER — Telehealth: Payer: Self-pay | Admitting: Physician Assistant

## 2023-09-11 ENCOUNTER — Ambulatory Visit: Attending: Orthopaedic Surgery

## 2023-09-11 MED ORDER — DICLOFENAC SODIUM 75 MG PO TBEC: 75.0000 mg | DELAYED_RELEASE_TABLET | Freq: Two times a day (BID) | ORAL | 1 refills | Status: DC | PRN

## 2023-09-11 NOTE — Telephone Encounter (Signed)
 Patient called. Says the pain medication isn't working. She is still in a lot of pain. Her cb# [phone]

## 2023-09-11 NOTE — Telephone Encounter (Signed)
LMOM for patient of the below message  

## 2023-09-16 ENCOUNTER — Other Ambulatory Visit: Payer: Self-pay

## 2023-09-16 MED ORDER — TALICIA 250-12.5-10 MG PO CPDR: 4.0000 | DELAYED_RELEASE_CAPSULE | Freq: Three times a day (TID) | ORAL | 0 refills | Status: AC

## 2023-09-18 ENCOUNTER — Ambulatory Visit: Payer: Self-pay

## 2023-09-18 ENCOUNTER — Telehealth: Payer: Self-pay | Admitting: *Deleted

## 2023-09-18 DIAGNOSIS — A048 Other specified bacterial intestinal infections: Secondary | ICD-10-CM

## 2023-09-18 NOTE — Progress Notes (Unsigned)
 Complex Care Management Care Guide Note  09/18/2023 Name: [name] MRN: [id number] DOB: 08/23/1965  Jean [name] is a 58 y.o. year old female who is a primary care patient of Carleen Chary, DO and is actively engaged with the care management team. I reached out to Berline Brenner by phone today to assist with re-scheduling  with the RN Case Manager Licensed Clinical Social Worker.  Follow up plan: Unsuccessful telephone outreach attempt made. A HIPAA compliant phone message was left for the patient providing contact information and requesting a return call.  Barnie Bora  [facility]  Value-Based Care Institute, [facility] Guide  Direct Dial: [phone]  Fax [phone]

## 2023-09-19 ENCOUNTER — Ambulatory Visit: Admitting: Physician Assistant

## 2023-09-19 NOTE — Progress Notes (Signed)
 Complex Care Management Care Guide Note  09/19/2023 Name: EMELINDA MARBLE MRN: [id number] DOB: 24-Oct-1965  Trenna Fritz Mauger is a 58 y.o. year old female who is a primary care patient of Carleen Chary, DO and is actively engaged with the care management team. I reached out to Berline Brenner by phone today to assist with re-scheduling  with the RN Case Manager Licensed Clinical Social Worker.  Follow up plan: Telephone appointment with complex care management team member scheduled for:  6/12 with RNCM and 6/13 with LCSW   Barnie Bora  [facility], [facility] Guide  Direct Dial: [phone]  Fax [phone]

## 2023-09-23 ENCOUNTER — Ambulatory Visit: Admitting: Physician Assistant

## 2023-10-02 ENCOUNTER — Ambulatory Visit: Admitting: Gastroenterology

## 2023-10-06 ENCOUNTER — Other Ambulatory Visit: Payer: Self-pay | Admitting: Orthopaedic Surgery

## 2023-10-07 ENCOUNTER — Ambulatory Visit: Admitting: Physician Assistant

## 2023-10-17 ENCOUNTER — Other Ambulatory Visit: Payer: Self-pay

## 2023-10-17 DIAGNOSIS — I10 Essential (primary) hypertension: Secondary | ICD-10-CM

## 2023-10-17 DIAGNOSIS — Z789 Other specified health status: Secondary | ICD-10-CM

## 2023-10-17 DIAGNOSIS — M1711 Unilateral primary osteoarthritis, right knee: Secondary | ICD-10-CM

## 2023-10-17 NOTE — Patient Instructions (Signed)
 Visit Information  Ms. Eskenazi was given information about Medicaid Managed Care team care coordination services as a part of their [facility] Community Plan Medicaid benefit. Berline Brenner verbally consented to engagement with the [facility] Managed Care team.   If you are experiencing a medical emergency, please call 911 or report to your local emergency department or urgent care.   If you have a non-emergency medical problem during routine business hours, please contact your provider's office and ask to speak with a nurse.   For questions related to your [facility], please call: [phone] or visit the homepage here: [email or web address]  If you would like to schedule transportation through your [facility], please call the following number at least 2 days in advance of your appointment: [phone]   Rides for urgent appointments can also be made after hours by calling Member Services.  Call the Behavioral Health Crisis Line at [phone], at any time, 24 hours a day, 7 days a week. If you are in danger or need immediate medical attention call 911.  If you would like help to quit smoking, call 1-800-QUIT-NOW ((605)081-6584) OR Espaol: 1-855-Djelo-Ya (2-536-644-0347) o para ms informacin haga clic aqu or Text READY to 425-956 to register via text  Ms. Vidrine - following are the goals we discussed in your visit today:   Goals Addressed             This Visit's Progress    VBCI RN Care Plan- HTN       Problems:  Chronic Disease Management support and education needs related to HTN  Goal: Over the next 30 days the Patient will attend all scheduled medical appointments: with PCP and specialist as evidenced by keeping al scheduled appointments        demonstrate Ongoing adherence to prescribed treatment plan for HTN as evidenced by no admissions to the  hospital verbalize basic understanding of HTN disease process and self health management plan as evidenced by verbal explanation lifestyle changes and consistent medication compliance   Interventions:   STOP LIGHT  Hypertension (HTN) or High Blood Pressure Definition: A condition in which the pressure in your arteries is higher than it should be. Blood pressure is written as two numbers such as 131/84. The top number represents the pressure in the arteries when the heart beats. The bottom number represents the pressure in the arteries in between heart beats.    Some Hypertension signs/symptoms:  It is very important to know that most people with high blood pressure experience no signs or symptoms. However, in the early stages of hypertension, some may experience the following symptoms: Dizzy spells or headaches Nosebleeds Flushing around the face Blurred vision or feeling confused Chest pain or abnormal heartbeat  Things you can do: Decrease the salt in your diet and consume healthy foods Increase your activity while maintaining a healthy weight Manage your stress Don't smoke Monitor your blood pressure at home with a blood pressure device     Hypertension Interventions: Last practice recorded BP readings:  BP Readings from Last 3 Encounters:  07/23/23 124/80  07/17/23 125/74  07/04/23 134/77   Most recent eGFR/CrCl:  Lab Results  Component Value Date   EGFR 42 (L) 11/29/2022    No components found for: CRCL  Evaluation of current treatment plan related to hypertension self management and patient's adherence to plan as established by provider Reviewed medications with patient and discussed importance of compliance Discussed plans with patient  for ongoing care management follow up and provided patient with direct contact information for care management team Advised patient, providing education and rationale, to monitor blood pressure daily and record, calling PCP for  findings outside established parameters Screening for signs and symptoms of depression related to chronic disease state  Assessed social determinant of health barriers Discussed no salt diet  Drink water  to help clean system  Patient Self-Care Activities:  Attend all scheduled provider appointments Call pharmacy for medication refills 3-7 days in advance of running out of medications Call provider office for new concerns or questions  Take medications as prescribed    Plan:  Telephone follow up appointment with care management team member scheduled for:  11/01/23  330 pm             Please see education materials related to HTN provided The patient verbalized understanding of instructions, educational materials, and care plan provided today and DECLINED offer to receive copy of patient instructions, educational materials, and care plan.   Telephone follow up appointment with care management team member scheduled for:  11/01/23  330 pm  Augustin Leber RN, BSN, [facility] [place]  [facility], [facility] Health   Care Coordinator Phone: [phone]     Following is a copy of your plan of care:  There are no care plans that you recently modified to display for this patient.

## 2023-10-17 NOTE — Patient Outreach (Addendum)
 Complex Care Management   Visit Note  10/17/2023  Name:  Jean [name] MRN: [id number] DOB: 1966/01/30  Situation: Referral received for Complex Care Management related to HTN I obtained verbal consent from Patient.  Visit completed with patient  on the phone  Background:   Past Medical History:  Diagnosis Date   Adnexal mass 2019   likely remnant fibroid on broad ligament; getting diagnostic lap   Anxiety    on meds   Arthritis    hip-uses OTC meds   Asthma    uses inhaler as needed   Carpal tunnel syndrome    bilateral   Chest wall pain 10/15/2016   Chronic lower back pain    COPD (chronic obstructive pulmonary disease) (HCC)    uses in haler as needed   Depression    on meds   Dyspnea    with exertion   Dysrhythmia    GERD (gastroesophageal reflux disease)    Headache    migraines   Herpes    Hypertension    on meds   Liver lesion, right lobe 12/01/2015   CT chest without contrast 11/30/15 with hepatic lesions measuring 2.0 x 1.7 cm of the right lobe and 1 x 1 cm on the dome. CT abd/pel 8/18: 1.3cm inferior R liver lobe, prob benign but recommend 3-10mo f/u with MRI w/wo IV contrast with attenuation MRI 6/19: combination of benign cavernous hemangiomas and small benign cysts   PONV (postoperative nausea and vomiting)    Poor dental hygiene    chipped upper front and several loose teeth   Seasonal allergies    Sickle cell trait (HCC)    Substance abuse (HCC)    cocaine/crack cocaine - last use 02/07/18   SVD (spontaneous vaginal delivery)    x 1   Tobacco abuse 07/08/2015   Uses walker    for ambulation   Uterine leiomyoma 02/16/2016   s/p vaginal hysterectomy 2018   Vaginal discharge 03/26/2018    Assessment:  I had a discussion with Mrs. Bufano, during which she expressed confusion regarding the medications she is currently taking. She intends to contact the pharmacy to verify whether she has refills available for the medications she has run out of.  Additionally, we addressed her blood pressure concerns, as she indicated that it tends to be elevated, with today's reading at 179/86. We also explored her dietary habits, and I recommended that she reduce her sodium intake, increase her water  consumption to promote flushing of her system, and continue monitoring her blood pressure regularly. Patient Reported Symptoms:  Cognitive Cognitive Status: Able to follow simple commands, Alert and oriented to person, place, and time      Neurological Neurological Review of Symptoms: No symptoms reported    HEENT HEENT Symptoms Reported: No symptoms reported      Cardiovascular Cardiovascular Symptoms Reported: Swelling in legs or feet Does patient have uncontrolled Hypertension?: Yes Is patient checking Blood Pressure at home?: Yes Patient's Recent BP reading at home: 179/86 Cardiovascular Conditions: Hypertension Cardiovascular Management Strategies: Medication therapy  Respiratory Respiratory Symptoms Reported: No symptoms reported    Endocrine Patient reports the following symptoms related to hypoglycemia or hyperglycemia : No symptoms reported Is patient diabetic?: No    Gastrointestinal Gastrointestinal Symptoms Reported: No symptoms reported      Genitourinary Genitourinary Symptoms Reported: No symptoms reported    Integumentary Integumentary Symptoms Reported: No symptoms reported    Musculoskeletal Musculoskelatal Symptoms Reviewed: Difficulty walking, Unsteady gait, Weakness Musculoskeletal Conditions: Back  pain Musculoskeletal Management Strategies: Medical device Falls in the past year?: No    Psychosocial       Quality of Family Relationships: supportive Do you feel physically threatened by others?: No      10/17/2023    4:43 PM  Depression screen PHQ 2/9  Decreased Interest 2  Down, Depressed, Hopeless 1  PHQ - 2 Score 3  Altered sleeping 1  Tired, decreased energy 3  Change in appetite 1  Feeling bad or failure  about yourself  0  Trouble concentrating 2  Moving slowly or fidgety/restless 3  PHQ-9 Score 13    There were no vitals filed for this visit.  Medications Reviewed Today     Reviewed by Augustin Leber, RN (Registered Nurse) on 10/17/23 at 1627  Med List Status: <None>   Medication Order Taking? Sig Documenting Provider Last Dose Status Informant  acetaminophen  (TYLENOL ) 500 MG tablet [id number] Yes Take 1,000 mg by mouth every 6 (six) hours as needed for moderate pain (pain score 4-6). [provider]  Active Self, Multiple Informants, Pharmacy Records  albuterol  (VENTOLIN  HFA) 108 (90 Base) MCG/ACT inhaler [id number] Yes Inhale 2 puffs into the lungs every 4 (four) hours as needed for wheezing or shortness of breath. Hunsucker, Archer Kobs, MD  Active Self, Multiple Informants, Pharmacy Records  aspirin  81 MG chewable tablet [id number] Yes Chew 1 tablet (81 mg total) by mouth 2 (two) times daily. Arnie Lao, MD  Active Self, Multiple Informants, Pharmacy Records  atorvastatin  (LIPITOR) 40 MG tablet [id number]  Take 1 tablet (40 mg total) by mouth daily.  Patient not taking: Reported on 10/17/2023   Von Grumbling, PA-C  Expired 07/02/23 2359   cyclobenzaprine  (FLEXERIL ) 10 MG tablet [id number] Yes Take 1 tablet (10 mg total) by mouth 3 (three) times daily as needed for muscle spasms. TAKE 1 TABLET BY MOUTH TWICE A DAY AS NEEDED FOR MUSCLE SPASMS Arnie Lao, MD  Active Self, Multiple Informants, Pharmacy Records  diclofenac  (VOLTAREN ) 75 MG EC tablet [id number]  TAKE 1 TABLET BY MOUTH 2 TIMES DAILY AS NEEDED.  Patient not taking: Reported on 10/17/2023   Arnie Lao, MD  Active   ferrous sulfate  325 (65 FE) MG tablet [id number] Yes TAKE 1 TABLET BY MOUTH EVERY OTHER DAY Juberg, Christopher, DO  Active   gabapentin  (NEURONTIN ) 300 MG capsule [id number] Yes TAKE 1 CAPSULE BY MOUTH EVERYDAY AT BEDTIME Arnie Lao, MD  Active    HYDROcodone -acetaminophen  (NORCO) 7.5-325 MG tablet [id number] Yes Take 1 tablet by mouth every 6 (six) hours as needed for moderate pain (pain score 4-6). Arnie Lao, MD  Active   pantoprazole  (PROTONIX ) 40 MG tablet [id number]  Take 1 tablet (40 mg total) by mouth daily.  Patient not taking: Reported on 10/17/2023   Juberg, Christopher, DO  Active   sertraline  (ZOLOFT ) 25 MG tablet [id number] Yes Take 1 tablet (25 mg total) by mouth daily. Adria Hopkins, MD  Active   spironolactone  (ALDACTONE ) 25 MG tablet [id number] Yes TAKE 1 TABLET (25 MG TOTAL) BY MOUTH DAILY. Nahser, Lela Purple, MD  Active             Recommendation:   PCP Follow-up Call Pharmacy regarding your medications  Follow Up Plan:   Telephone follow up appointment with care management team member scheduled for:  11/01/23  330 pm  Augustin Leber RN, BSN, [facility] [place]  [facility], [facility] Health   Care Coordinator Phone: [phone]

## 2023-10-18 ENCOUNTER — Other Ambulatory Visit: Payer: Self-pay | Admitting: Licensed Clinical Social Worker

## 2023-10-18 DIAGNOSIS — F321 Major depressive disorder, single episode, moderate: Secondary | ICD-10-CM

## 2023-10-18 DIAGNOSIS — Z789 Other specified health status: Secondary | ICD-10-CM

## 2023-10-18 NOTE — Patient Outreach (Signed)
 Complex Care Management   Visit Note  10/18/2023  Name:  Jean [name] MRN: [id number] DOB: 10/20/1965  Situation: Referral received for Complex Care Management related to Mental/Behavioral Health diagnosis Depression and SDOH Barriers:  Transportation and Housing. I obtained verbal consent from Patient. Visit completed with patient on the phone  Background:   Past Medical History:  Diagnosis Date   Adnexal mass 2019   likely remnant fibroid on broad ligament; getting diagnostic lap   Anxiety    on meds   Arthritis    hip-uses OTC meds   Asthma    uses inhaler as needed   Carpal tunnel syndrome    bilateral   Chest wall pain 10/15/2016   Chronic lower back pain    COPD (chronic obstructive pulmonary disease) (HCC)    uses in haler as needed   Depression    on meds   Dyspnea    with exertion   Dysrhythmia    GERD (gastroesophageal reflux disease)    Headache    migraines   Herpes    Hypertension    on meds   Liver lesion, right lobe 12/01/2015   CT chest without contrast 11/30/15 with hepatic lesions measuring 2.0 x 1.7 cm of the right lobe and 1 x 1 cm on the dome. CT abd/pel 8/18: 1.3cm inferior R liver lobe, prob benign but recommend 3-47mo f/u with MRI w/wo IV contrast with attenuation MRI 6/19: combination of benign cavernous hemangiomas and small benign cysts   PONV (postoperative nausea and vomiting)    Poor dental hygiene    chipped upper front and several loose teeth   Seasonal allergies    Sickle cell trait (HCC)    Substance abuse (HCC)    cocaine/crack cocaine - last use 02/07/18   SVD (spontaneous vaginal delivery)    x 1   Tobacco abuse 07/08/2015   Uses walker    for ambulation   Uterine leiomyoma 02/16/2016   s/p vaginal hysterectomy 2018   Vaginal discharge 03/26/2018   SDOH Screenings   Food Insecurity: No Food Insecurity (10/18/2023)  Housing: High Risk (10/18/2023)  Transportation Needs: Unmet Transportation Needs (10/18/2023)  Utilities: Not  At Risk (10/18/2023)  Alcohol  Screen: Low Risk  (06/20/2023)  Depression (PHQ2-9): High Risk (10/18/2023)  Financial Resource Strain: High Risk (06/13/2023)  Physical Activity: Inactive (07/19/2023)  Social Connections: Socially Isolated (07/19/2023)  Stress: Stress Concern Present (06/13/2023)  Tobacco Use: Medium Risk (08/26/2023)  Health Literacy: Inadequate Health Literacy (10/18/2023)   Assessment: Patient Reported Symptoms:  Cognitive Cognitive Status: Alert and oriented to person, place, and time, Able to follow simple commands      Neurological Neurological Review of Symptoms: No symptoms reported    HEENT HEENT Symptoms Reported: No symptoms reported      Cardiovascular    No Symptoms Reported  Respiratory Respiratory Symptoms Reported: No symptoms reported    Endocrine Patient reports the following symptoms related to hypoglycemia or hyperglycemia : No symptoms reported    Gastrointestinal Gastrointestinal Symptoms Reported: No symptoms reported      Genitourinary  No Symptoms Reported    Integumentary  No Symptoms Reported    Musculoskeletal Musculoskelatal Symptoms Reviewed: Difficulty walking, Unsteady gait, Weakness Musculoskeletal Conditions: Back pain Musculoskeletal Management Strategies: Medical device      Psychosocial Psychosocial Symptoms Reported: Anxiety - if selected complete GAD, Depression - if selected complete PHQ 2-9 Additional Psychological Details: Pt is agreeable to therapy and psychiatry referrals to help alleviate her depressive symptoms Behavioral  Health Conditions: Depression, Anxiety Behavioral Management Strategies: Adequate rest, Medication therapy Behavioral Health Self-Management Outcome: 2 (bad) Major Change/Loss/Stressor/Fears (CP): Environment Techniques to [employer] with Loss/Stress/Change: Withdraw Quality of Family Relationships: non-existent Do you feel physically threatened by others?: No      10/18/2023    1:05 PM  Depression screen  PHQ 2/9  Decreased Interest 2  Down, Depressed, Hopeless 2  PHQ - 2 Score 4  Altered sleeping 1  Tired, decreased energy 3  Change in appetite 1  Feeling bad or failure about yourself  0  Trouble concentrating 2  Moving slowly or fidgety/restless 2  Suicidal thoughts 0  PHQ-9 Score 13  Difficult doing work/chores Very difficult      10/18/2023    1:14 PM 03/26/2018    9:26 AM 11/18/2017    8:47 AM 10/08/2016    2:34 PM  GAD 7 : Generalized Anxiety Score  Nervous, Anxious, on Edge 2 2 3 3   Control/stop worrying 1 3 2 3   Worry too much - different things 1 3 2 2   Trouble relaxing 0 2 2 2   Restless 0 2 2 1   Easily annoyed or irritable 0 2 2 3   Afraid - awful might happen 2 2 2  0  Total GAD 7 Score 6 16 15 14    There were no vitals filed for this visit.  Medications Reviewed Today     Reviewed by Elie Grove, LCSW (Social Worker) on 10/18/23 at 1415  Med List Status: <None>   Medication Order Taking? Sig Documenting Provider Last Dose Status Informant  acetaminophen  (TYLENOL ) 500 MG tablet [id number]  Take 1,000 mg by mouth every 6 (six) hours as needed for moderate pain (pain score 4-6). [provider]  Active Self, Multiple Informants, Pharmacy Records  albuterol  (VENTOLIN  HFA) 108 (90 Base) MCG/ACT inhaler [id number]  Inhale 2 puffs into the lungs every 4 (four) hours as needed for wheezing or shortness of breath. Hunsucker, Archer Kobs, MD  Active Self, Multiple Informants, Pharmacy Records  aspirin  81 MG chewable tablet [id number]  Chew 1 tablet (81 mg total) by mouth 2 (two) times daily. Arnie Lao, MD  Active Self, Multiple Informants, Pharmacy Records  atorvastatin  (LIPITOR) 40 MG tablet [id number]  Take 1 tablet (40 mg total) by mouth daily.  Patient not taking: Reported on 10/17/2023   Von Grumbling, PA-C  Expired 07/02/23 2359   cyclobenzaprine  (FLEXERIL ) 10 MG tablet [id number]  Take 1 tablet (10 mg total) by mouth 3 (three) times daily as needed for  muscle spasms. TAKE 1 TABLET BY MOUTH TWICE A DAY AS NEEDED FOR MUSCLE SPASMS Arnie Lao, MD  Active Self, Multiple Informants, Pharmacy Records  diclofenac  (VOLTAREN ) 75 MG EC tablet [id number]  TAKE 1 TABLET BY MOUTH 2 TIMES DAILY AS NEEDED.  Patient not taking: Reported on 10/17/2023   Arnie Lao, MD  Active   ferrous sulfate  325 (65 FE) MG tablet [id number]  TAKE 1 TABLET BY MOUTH EVERY OTHER DAY Juberg, Christopher, DO  Active   gabapentin  (NEURONTIN ) 300 MG capsule [id number]  TAKE 1 CAPSULE BY MOUTH EVERYDAY AT BEDTIME Arnie Lao, MD  Active   HYDROcodone -acetaminophen  (NORCO) 7.5-325 MG tablet [id number]  Take 1 tablet by mouth every 6 (six) hours as needed for moderate pain (pain score 4-6). Arnie Lao, MD  Active   pantoprazole  (PROTONIX ) 40 MG tablet [id number]  Take 1 tablet (40 mg total) by mouth daily.  Patient not taking: Reported on 10/17/2023  Carleen Chary, DO  Active   sertraline  (ZOLOFT ) 25 MG tablet [id number]  Take 1 tablet (25 mg total) by mouth daily. Adria Hopkins, MD  Active   spironolactone  (ALDACTONE ) 25 MG tablet [id number]  TAKE 1 TABLET (25 MG TOTAL) BY MOUTH DAILY. Nahser, Lela Purple, MD  Active             Recommendation:   Continue Current Plan of Care  Follow Up Plan:   Telephone follow-up in 1 month Referral to BSW  Kolleen Perone, BSW, MSW, [employer] Licensed Clinical Social Worker [employer]   [facility] [email or web address] Direct Dial: [phone]

## 2023-10-18 NOTE — Patient Instructions (Addendum)
 Visit Information  Jean [name] was given information about Medicaid Managed Care team care coordination services as a part of their [facility] Community Plan Medicaid benefit. Jean [name] verbally consented to engagement with the [facility] Managed Care team.   If you are experiencing a medical emergency, please call 911 or report to your local emergency department or urgent care.   If you have a non-emergency medical problem during routine business hours, please contact your provider's office and ask to speak with a nurse.   For questions related to your [facility], please call: [phone] or visit the homepage here: [email or web address]  If you would like to schedule transportation through your [facility], please call the following number at least 2 days in advance of your appointment: [phone]   Rides for urgent appointments can also be made after hours by calling Member Services.  Call the Behavioral Health Crisis Line at [phone], at any time, 24 hours a day, 7 days a week. If you are in danger or need immediate medical attention call 911.  If you would like help to quit smoking, call 1-800-QUIT-NOW (848-037-9551) OR Espaol: 1-855-Djelo-Ya (1-324-401-0272) o para ms informacin haga clic aqu or Text READY to 536-644 to register via text  Jean [name] - following are the goals we discussed in your visit today:   Goals Addressed             This Visit's Progress    LCSW-VBCI Social Work Care Plan       Problems:   ADL IADL limitations, Housing barriers, Limited social support, Mental Health Concerns , Social Isolation, and Transportation, Disease Management support and education needs related to Depression and Lacks knowledge of how to connect   CSW Clinical Goal(s):   Over the next 90 days the Patient will demonstrate a reduction in symptoms related to  Anxiety with Excessive Worry, Depression: depressed mood feelings of worthlessness, guilt, hopelessness, explore community resource options for unmet needs related to Depression, Housing , Social Connections, and Transportation.  Interventions:  Mental Health:  Evaluation of current treatment plan related to Depression: anxiety and loss of energy/fatigue Active listening / Reflection utilized Mining engineer reviewed Financial risk analyst / information provided Depression screen reviewed Discussed referral for psychiatry: [facility] psychiatry referral placed Discussed referral options to connect for ongoing therapy: [facility] therapy referral placed  Emotional Support Provided Mindfulness or Relaxation training provided Motivational Interviewing employed PHQ2/PHQ9 completed Provided general psycho-education for mental health needs Quality of sleep assessed & Sleep Hygiene techniques promoted Reviewed mental health medications and discussed importance of compliance: Zoloft  25 mg Solution-Focued Strategies employed: Suicidal Ideation/Homicidal Ideation assessed: No SI/HI  Patient Goals/Self-Care Activities:  Call your insurance provider to discuss transportation options [facility] [employer] number provided.  Connect with provider for ongoing mental health treatment.   Continue taking your medication as prescribed.   Follow up with [employer]. Increase coping skills, healthy habits, self-management skills, and stress reduction  Plan:   The care management team will reach out to the patient again over the next 30 days. VBCI LCSW will call you on 10/30/23 at 3:30 pm         The patient verbalized understanding of instructions, educational materials, and care plan provided today and DECLINED offer to receive copy of patient instructions, educational materials, and care plan.   VBCI LCSW will call you on 10/30/23 at 3:30 pm   Kolleen Perone, BSW, MSW, LCSW Licensed  Clinical Social Worker  [facility]   [facility] [email or web address] Direct Dial: [phone]

## 2023-10-22 ENCOUNTER — Ambulatory Visit (HOSPITAL_COMMUNITY)

## 2023-10-29 ENCOUNTER — Other Ambulatory Visit: Payer: Self-pay | Admitting: Pulmonary Disease

## 2023-10-29 DIAGNOSIS — J449 Chronic obstructive pulmonary disease, unspecified: Secondary | ICD-10-CM

## 2023-10-30 ENCOUNTER — Encounter: Payer: Self-pay | Admitting: Licensed Clinical Social Worker

## 2023-10-30 ENCOUNTER — Other Ambulatory Visit: Payer: Self-pay | Admitting: Licensed Clinical Social Worker

## 2023-10-30 NOTE — Patient Instructions (Signed)
 SABRA

## 2023-10-30 NOTE — Patient Instructions (Addendum)
 Visit Information  Thank you for taking time to visit with me today. Please don't hesitate to contact me if I can be of assistance to you before our next scheduled appointment.  Your next care management appointment is by telephone on 11/07/23 at 130 pm  Please call the care guide team at [phone] if you need to cancel, schedule, or reschedule an appointment.   Please call the Suicide and Crisis Lifeline: 988 go to [facility] Urgent [facility] [address], [place] [phone]) if you are experiencing a Mental Health or Behavioral Health Crisis or need someone to talk to.  Lyle Rung, BSW, MSW, LCSW Licensed Clinical Social Worker [employer]   [facility] [email or web address] Direct Dial: [phone]

## 2023-10-30 NOTE — Patient Outreach (Signed)
 Complex Care Management   Visit Note  10/30/2023  Name:  Jean [name] MRN: [id number] DOB: 15-Mar-1966  Situation: Referral received for Complex Care Management related to Stress management I obtained verbal consent from Patient.  Visit completed with VBCI LCSW over the phone. However, patient reports that she is unable to stay on the call long today and would prefer to reschedule this follow up visit regarding her mental health.    Background:   Past Medical History:  Diagnosis Date   Adnexal mass 2019   likely remnant fibroid on broad ligament; getting diagnostic lap   Anxiety    on meds   Arthritis    hip-uses OTC meds   Asthma    uses inhaler as needed   Carpal tunnel syndrome    bilateral   Chest wall pain 10/15/2016   Chronic lower back pain    COPD (chronic obstructive pulmonary disease) (HCC)    uses in haler as needed   Depression    on meds   Dyspnea    with exertion   Dysrhythmia    GERD (gastroesophageal reflux disease)    Headache    migraines   Herpes    Hypertension    on meds   Liver lesion, right lobe 12/01/2015   CT chest without contrast 11/30/15 with hepatic lesions measuring 2.0 x 1.7 cm of the right lobe and 1 x 1 cm on the dome. CT abd/pel 8/18: 1.3cm inferior R liver lobe, prob benign but recommend 3-78mo f/u with MRI w/wo IV contrast with attenuation MRI 6/19: combination of benign cavernous hemangiomas and small benign cysts   PONV (postoperative nausea and vomiting)    Poor dental hygiene    chipped upper front and several loose teeth   Seasonal allergies    Sickle cell trait (HCC)    Substance abuse (HCC)    cocaine/crack cocaine - last use 02/07/18   SVD (spontaneous vaginal delivery)    x 1   Tobacco abuse 07/08/2015   Uses walker    for ambulation   Uterine leiomyoma 02/16/2016   s/p vaginal hysterectomy 2018   Vaginal discharge 03/26/2018       10/18/2023    1:05 PM  Depression screen PHQ 2/9  Decreased Interest 2  Down,  Depressed, Hopeless 2  PHQ - 2 Score 4  Altered sleeping 1  Tired, decreased energy 3  Change in appetite 1  Feeling bad or failure about yourself  0  Trouble concentrating 2  Moving slowly or fidgety/restless 2  Suicidal thoughts 0  PHQ-9 Score 13  Difficult doing work/chores Very difficult   Brief safety assessment completed. Patient denies any current crises or SI/HI. She was struggled encouraged to go to [facility] walk in clinic to start her treatment.  There were no vitals filed for this visit.  Medications Reviewed Today     Reviewed by Merlynn Lyle CROME, LCSW (Social Worker) on 10/30/23 at 1556  Med List Status: <None>   Medication Order Taking? Sig Documenting Provider Last Dose Status Informant  acetaminophen  (TYLENOL ) 500 MG tablet [id number]  Take 1,000 mg by mouth every 6 (six) hours as needed for moderate pain (pain score 4-6). [provider]  Active Self, Multiple Informants, Pharmacy Records  aspirin  81 MG chewable tablet [id number]  Chew 1 tablet (81 mg total) by mouth 2 (two) times daily. Vernetta Lonni GRADE, MD  Active Self, Multiple Informants, Pharmacy Records  atorvastatin  (LIPITOR) 40 MG tablet [id number]  Take 1 tablet (40  mg total) by mouth daily.  Patient not taking: Reported on 10/17/2023   Lucien Orren SAILOR, PA-C  Expired 07/02/23 2359   cyclobenzaprine  (FLEXERIL ) 10 MG tablet [id number]  Take 1 tablet (10 mg total) by mouth 3 (three) times daily as needed for muscle spasms. TAKE 1 TABLET BY MOUTH TWICE A DAY AS NEEDED FOR MUSCLE SPASMS Vernetta Lonni GRADE, MD  Active Self, Multiple Informants, Pharmacy Records  diclofenac  (VOLTAREN ) 75 MG EC tablet [id number]  TAKE 1 TABLET BY MOUTH 2 TIMES DAILY AS NEEDED.  Patient not taking: Reported on 10/17/2023   Vernetta Lonni GRADE, MD  Active   ferrous sulfate  325 (65 FE) MG tablet [id number]  TAKE 1 TABLET BY MOUTH EVERY OTHER DAY Juberg, Christopher, DO  Active   gabapentin  (NEURONTIN ) 300 MG capsule [id number]   TAKE 1 CAPSULE BY MOUTH EVERYDAY AT BEDTIME Vernetta Lonni GRADE, MD  Active   HYDROcodone -acetaminophen  (NORCO) 7.5-325 MG tablet [id number]  Take 1 tablet by mouth every 6 (six) hours as needed for moderate pain (pain score 4-6). Vernetta Lonni GRADE, MD  Active   pantoprazole  (PROTONIX ) 40 MG tablet [id number]  Take 1 tablet (40 mg total) by mouth daily.  Patient not taking: Reported on 10/17/2023   Juberg, Christopher, DO  Active   sertraline  (ZOLOFT ) 25 MG tablet [id number]  Take 1 tablet (25 mg total) by mouth daily. Norrine Sharper, MD  Active   spironolactone  (ALDACTONE ) 25 MG tablet [id number]  TAKE 1 TABLET (25 MG TOTAL) BY MOUTH DAILY. Nahser, Aleene PARAS, MD  Active   VENTOLIN  HFA 108 [phone] Base) MCG/ACT inhaler [id number]  INHALE 2 PUFFS INTO THE LUNGS EVERY 4 HOURS AS NEEDED FOR WHEEZING OR SHORTNESS OF BREATH. Hunsucker, Donnice SAUNDERS, MD  Active             Recommendation:   PCP Follow-up Continue Current Plan of Care Be ready for VBCI RNCM call on 11/01/23 at 3:30 pm. Appointment confirmed with patient today.  Follow Up Plan:   Telephone follow-up in 1 day  Lyle Rung, BSW, MSW, LCSW Licensed Clinical Social Worker [employer]   [facility] [email or web address] Direct Dial: [phone]

## 2023-10-31 ENCOUNTER — Telehealth: Payer: Self-pay

## 2023-10-31 DIAGNOSIS — A048 Other specified bacterial intestinal infections: Secondary | ICD-10-CM

## 2023-10-31 NOTE — Telephone Encounter (Signed)
 Attempted to call patient to remind her it is time to come off of her Pantoprazole  ahead of repeat stool testing in 2 weeks. No answer, mailbox full. Unable to leave message.

## 2023-10-31 NOTE — Telephone Encounter (Signed)
-----   Message from Nurse Daphne D sent at 09/19/2023  1:12 PM EDT ----- Call patient to stop taking PPI today

## 2023-11-01 ENCOUNTER — Other Ambulatory Visit: Payer: Self-pay

## 2023-11-01 NOTE — Patient Outreach (Signed)
 Complex Care Management   Visit Note  11/01/2023  Name:  Jean [name] MRN: [id number] DOB: 1966-02-05  Situation: Referral received for Complex Care Management related to HTN I obtained verbal consent from Patient.  Visit completed with patient  on the phone  Background:   Past Medical History:  Diagnosis Date   Adnexal mass 2019   likely remnant fibroid on broad ligament; getting diagnostic lap   Anxiety    on meds   Arthritis    hip-uses OTC meds   Asthma    uses inhaler as needed   Carpal tunnel syndrome    bilateral   Chest wall pain 10/15/2016   Chronic lower back pain    COPD (chronic obstructive pulmonary disease) (HCC)    uses in haler as needed   Depression    on meds   Dyspnea    with exertion   Dysrhythmia    GERD (gastroesophageal reflux disease)    Headache    migraines   Herpes    Hypertension    on meds   Liver lesion, right lobe 12/01/2015   CT chest without contrast 11/30/15 with hepatic lesions measuring 2.0 x 1.7 cm of the right lobe and 1 x 1 cm on the dome. CT abd/pel 8/18: 1.3cm inferior R liver lobe, prob benign but recommend 3-70mo f/u with MRI w/wo IV contrast with attenuation MRI 6/19: combination of benign cavernous hemangiomas and small benign cysts   PONV (postoperative nausea and vomiting)    Poor dental hygiene    chipped upper front and several loose teeth   Seasonal allergies    Sickle cell trait (HCC)    Substance abuse (HCC)    cocaine/crack cocaine - last use 02/07/18   SVD (spontaneous vaginal delivery)    x 1   Tobacco abuse 07/08/2015   Uses walker    for ambulation   Uterine leiomyoma 02/16/2016   s/p vaginal hysterectomy 2018   Vaginal discharge 03/26/2018    Assessment: Patient Reported Symptoms:  Cognitive Cognitive Status: Able to follow simple commands, Alert and oriented to person, place, and time, Normal speech and language skills      Neurological Neurological Review of Symptoms: Headaches,  Dizziness Neurological Conditions: Headache Neurological Comment: She takes tylenol  and she said that sometimes  it helps  HEENT        Cardiovascular Does patient have uncontrolled Hypertension?: No Is patient checking Blood Pressure at home?: Yes Patient's Recent BP reading at home: 140/80 Cardiovascular Comment: swelling on the right side of and has an appointment with Dr Vernetta on 9 th of JUly  Respiratory Respiratory Symptoms Reported: Not assesed    Endocrine Patient reports the following symptoms related to hypoglycemia or hyperglycemia : Not assessed    Gastrointestinal        Genitourinary      Integumentary      Musculoskeletal          Psychosocial              10/18/2023    1:05 PM  Depression screen PHQ 2/9  Decreased Interest 2  Down, Depressed, Hopeless 2  PHQ - 2 Score 4  Altered sleeping 1  Tired, decreased energy 3  Change in appetite 1  Feeling bad or failure about yourself  0  Trouble concentrating 2  Moving slowly or fidgety/restless 2  Suicidal thoughts 0  PHQ-9 Score 13  Difficult doing work/chores Very difficult    There were no vitals filed for  this visit.  Medications Reviewed Today   Medications were not reviewed in this encounter     Recommendation:   PCP Follow-up  Follow Up Plan:   Telephone follow up appointment date/time:  12/04/23  130 pm  Wilbert Diver RN, BSN, [facility] [place]  [facility], [facility]    Care Coordinator Phone: [phone]

## 2023-11-01 NOTE — Patient Instructions (Signed)
 Visit Information  Ms. Friley was given information about Medicaid Managed Care team care coordination services as a part of their [facility] Community Plan Medicaid benefit. [name] verbally consented to engagement with the [facility] Managed Care team.   If you are experiencing a medical emergency, please call 911 or report to your local emergency department or urgent care.   If you have a non-emergency medical problem during routine business hours, please contact your provider's office and ask to speak with a nurse.   For questions related to your [facility], please call: [phone] or visit the homepage here: [email or web address]  If you would like to schedule transportation through your [facility], please call the following number at least 2 days in advance of your appointment: [phone]   Rides for urgent appointments can also be made after hours by calling Member Services.  Call the Behavioral Health Crisis Line at [phone], at any time, 24 hours a day, 7 days a week. If you are in danger or need immediate medical attention call 911.  If you would like help to quit smoking, call 1-800-QUIT-NOW ((817)714-4972) OR Espaol: 1-855-Djelo-Ya (8-144-664-6430) o para ms informacin haga clic aqu or Text READY to 799-599 to register via text  Ms. Bellizzi - following are the goals we discussed in your visit today:   Goals Addressed             This Visit's Progress    VBCI RN Care Plan- HTN       Problems:  Chronic Disease Management support and education needs related to HTN  Goal: Over the next 30 days the Patient will attend all scheduled medical appointments: with PCP and specialist as evidenced by keeping al scheduled appointments        demonstrate Ongoing adherence to prescribed treatment plan for HTN as evidenced by no admissions to the  hospital verbalize basic understanding of HTN disease process and self health management plan as evidenced by verbal explanation lifestyle changes and consistent medication compliance   Interventions:   STOP LIGHT  Hypertension (HTN) or High Blood Pressure Definition: A condition in which the pressure in your arteries is higher than it should be. Blood pressure is written as two numbers such as 131/84. The top number represents the pressure in the arteries when the heart beats. The bottom number represents the pressure in the arteries in between heart beats.    Some Hypertension signs/symptoms:  It is very important to know that most people with high blood pressure experience no signs or symptoms. However, in the early stages of hypertension, some may experience the following symptoms: Dizzy spells or headaches Nosebleeds Flushing around the face Blurred vision or feeling confused Chest pain or abnormal heartbeat  Things you can do: Decrease the salt in your diet and consume healthy foods Increase your activity while maintaining a healthy weight Manage your stress Don't smoke Monitor your blood pressure at home with a blood pressure device     Hypertension Interventions: Last practice recorded BP readings:  BP Readings from Last 3 Encounters:  07/23/23 124/80  07/17/23 125/74  07/04/23 134/77   Most recent eGFR/CrCl:  Lab Results  Component Value Date   EGFR 42 (L) 11/29/2022    No components found for: CRCL  Evaluation of current treatment plan related to hypertension self management and patient's adherence to plan as established by provider Reviewed medications with patient and discussed importance of compliance Discussed plans with patient  for ongoing care management follow up and provided patient with direct contact information for care management team Advised patient, providing education and rationale, to monitor blood pressure daily and record, calling PCP for  findings outside established parameters Screening for signs and symptoms of depression related to chronic disease state  Assessed social determinant of health barriers Discussed no salt diet  Drink water  to help clean system  Patient Self-Care Activities:  Attend all scheduled provider appointments Call pharmacy for medication refills 3-7 days in advance of running out of medications Call provider office for new concerns or questions  Take medications as prescribed   check blood pressure daily learn about high blood pressure call doctor for signs and symptoms of high blood pressure take medications for blood pressure exactly as prescribed report new symptoms to your doctor  Plan:  Telephone follow up appointment with care management team member scheduled for:  12/04/23  130 pm             Please see education materials related to HTN provided as print materials.   The patient verbalized understanding of instructions, educational materials, and care plan provided today and agreed to receive a mailed copy of patient instructions, educational materials, and care plan.   Telephone follow up appointment date/time:  12/04/23  130 pm   Wilbert Diver RN, BSN, [facility]  [facility], [facility]    Care Coordinator Phone: [phone]      Following is a copy of your plan of care:  There are no care plans that you recently modified to display for this patient.

## 2023-11-01 NOTE — Telephone Encounter (Signed)
 Attempted to reach patient. No answer, mailbox full. Unable to leave vm.

## 2023-11-04 ENCOUNTER — Other Ambulatory Visit: Payer: Self-pay

## 2023-11-04 DIAGNOSIS — D649 Anemia, unspecified: Secondary | ICD-10-CM

## 2023-11-04 MED ORDER — DOXYCYCLINE HYCLATE 100 MG PO CAPS: 100.0000 mg | ORAL_CAPSULE | Freq: Two times a day (BID) | ORAL | 0 refills | Status: AC

## 2023-11-04 MED ORDER — METRONIDAZOLE 250 MG PO TABS: 250.0000 mg | ORAL_TABLET | Freq: Four times a day (QID) | ORAL | 0 refills | Status: AC

## 2023-11-04 MED ORDER — FERROUS SULFATE 325 (65 FE) MG PO TABS
325.0000 mg | ORAL_TABLET | ORAL | 2 refills | Status: AC
Start: 2023-11-04 — End: ?

## 2023-11-04 MED ORDER — BISMUTH SUBSALICYLATE 262 MG PO TABS: 2.0000 | ORAL_TABLET | Freq: Four times a day (QID) | ORAL | 0 refills | Status: AC

## 2023-11-04 MED ORDER — OMEPRAZOLE 20 MG PO CPDR: 20.0000 mg | DELAYED_RELEASE_CAPSULE | Freq: Two times a day (BID) | ORAL | 0 refills | Status: AC

## 2023-11-04 NOTE — Telephone Encounter (Signed)
 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 Patient reports that she never picked up the medication that was sent in on 09/16/2023. Reports her copay was going to be $900. Patient states that she forgot who originally ordered the medication and that's why she didn't call to let us  know. New rx sent to pharmacy for Flagyl , Doxycycline, Bismuth, and Omeprazole. Discussed instructions with patient on how to take medications.

## 2023-11-04 NOTE — Telephone Encounter (Signed)
 Pharmacy is requesting a 90 day supply

## 2023-11-06 ENCOUNTER — Other Ambulatory Visit: Payer: Self-pay
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
## 2023-11-06 NOTE — Telephone Encounter (Signed)
 ok

## 2023-11-06 NOTE — Patient Instructions (Signed)
 Visit Information  [name] was given information about Medicaid Managed Care team care coordination services as a part of their [facility] Community Plan Medicaid benefit. Jean [name] verbally consented to engagement with the [facility] Managed Care team.   If you are experiencing a medical emergency, please call 911 or report to your local emergency department or urgent care.   If you have a non-emergency medical problem during routine business hours, please contact your provider's office and ask to speak with a nurse.   For questions related to your [facility], please call: [phone] or visit the homepage here: [email or web address]  If you would like to schedule transportation through your [facility] please call the following number at least 2 days in advance of your appointment: [phone]   Rides for urgent appointments can also be made after hours by calling Member Services.  Call the Behavioral Health Crisis Line at [phone], at any time, 24 hours a day, 7 days a week. If you are in danger or need immediate medical attention call 911.  If you would like help to quit smoking, call 1-800-QUIT-NOW (561-174-9344) OR Espaol: 1-855-Djelo-Ya (8-144-664-6430) o para ms informacin haga clic aqu or Text READY to 799-599 to register via text  Jean [name] - following are the goals we discussed in your visit today:   Goals Addressed             This Visit's Progress    BSW VBCI Social Work Care Plan   On track    Problems:   Corporate treasurer , Geophysicist/field seismologist , Housing , and Transportation  CSW Clinical Goal(s):   Over the next 2 weeks the Patient will work with Child psychotherapist to address concerns related to food insecurity, financial strain, housing, and transportation.  Interventions:  BSW will provide patient referrals for food insecurity and financial  strain. BSW provided education on housing resource for [employer]. Patient's goal is to move out of home and live alone.  Patient Goals/Self-Care Activities:  Goal: move to a home by herself with no female roommates. Patient will also call [employer] to request information on how they can help.    Plan:   Telephone follow up appointment with care management team member scheduled for:  11/14/2023 at 10AM.         The patient verbalized understanding of instructions, educational materials, and care plan provided today and DECLINED offer to receive copy of patient instructions, educational materials, and care plan.   Telephone follow up appointment with Managed Medicaid care management team member scheduled for: 11/14/2023 AT 3pm  Jean [name], [place]/VBCI - [facility] Social Worker [phone]   Following is a copy of your plan of care:  There are no care plans that you recently modified to display for this patient.

## 2023-11-07 ENCOUNTER — Other Ambulatory Visit: Payer: Self-pay | Admitting: Licensed Clinical Social Worker

## 2023-11-07 NOTE — Patient Instructions (Signed)
 Visit Information  Thank you for taking time to visit with me today. Please don't hesitate to contact me if I can be of assistance to you before our next scheduled appointment.  Your next care management appointment is by telephone on 12/05/23 at 3 pm  Please call the care guide team at [phone] if you need to cancel, schedule, or reschedule an appointment.   Please call the Suicide and Crisis Lifeline: 988 go to [facility] Urgent [facility] [address] [place] [phone]) if you are experiencing a Mental Health or Behavioral Health Crisis or need someone to talk to.  Lyle Rung, BSW, MSW, LCSW Licensed Clinical Social Worker [employer]   [facility] [email or web address] Direct Dial: [phone]

## 2023-11-07 NOTE — Patient Outreach (Signed)
 Complex Care Management   Visit Note  11/07/2023  Name:  Jean [name] MRN: [id number] DOB: [birth year]  Situation: Referral received for Complex Care Management related to SDOH Barriers:  Depression I obtained verbal consent from Patient.  Visit completed with VBCI LCSW  on the phone  Background:   Past Medical History:  Diagnosis Date   Adnexal mass 2019   likely remnant fibroid on broad ligament; getting diagnostic lap   Anxiety    on meds   Arthritis    hip-uses OTC meds   Asthma    uses inhaler as needed   Carpal tunnel syndrome    bilateral   Chest wall pain 10/15/2016   Chronic lower back pain    COPD (chronic obstructive pulmonary disease) (HCC)    uses in haler as needed   Depression    on meds   Dyspnea    with exertion   Dysrhythmia    GERD (gastroesophageal reflux disease)    Headache    migraines   Herpes    Hypertension    on meds   Liver lesion, right lobe 12/01/2015   CT chest without contrast 11/30/15 with hepatic lesions measuring 2.0 x 1.7 cm of the right lobe and 1 x 1 cm on the dome. CT abd/pel 8/18: 1.3cm inferior R liver lobe, prob benign but recommend 3-77mo f/u with MRI w/wo IV contrast with attenuation MRI 6/19: combination of benign cavernous hemangiomas and small benign cysts   PONV (postoperative nausea and vomiting)    Poor dental hygiene    chipped upper front and several loose teeth   Seasonal allergies    Sickle cell trait (HCC)    Substance abuse (HCC)    cocaine/crack cocaine - last use 02/07/18   SVD (spontaneous vaginal delivery)    x 1   Tobacco abuse 07/08/2015   Uses walker    for ambulation   Uterine leiomyoma 02/16/2016   s/p vaginal hysterectomy 2018   Vaginal discharge 03/26/2018    Assessment: Patient Reported Symptoms:  Cognitive Cognitive Status: Able to follow simple commands, Normal speech and language skills      Neurological Neurological Review of Symptoms: Dizziness, Headaches Neurological Management  Strategies: Adequate rest Neurological Self-Management Outcome: 3 (uncertain) Neurological Comment: Tylenol  helps  HEENT HEENT Symptoms Reported: No symptoms reported      Cardiovascular Cardiovascular Symptoms Reported: Swelling in legs or feet Does patient have uncontrolled Hypertension?: No Is patient checking Blood Pressure at home?: Yes Cardiovascular Management Strategies: Medication therapy  Respiratory Respiratory Symptoms Reported: No symptoms reported    Endocrine Endocrine Symptoms Reported: No symptoms reported Is patient diabetic?: No    Gastrointestinal Gastrointestinal Symptoms Reported: No symptoms reported      Genitourinary Genitourinary Symptoms Reported: No symptoms reported    Integumentary    No symptoms Reported  Musculoskeletal Musculoskelatal Symptoms Reviewed: Difficulty walking, Joint pain, Limited mobility, Unsteady gait, Weakness Musculoskeletal Management Strategies: Medical device Musculoskeletal Self-Management Outcome: 2 (bad)      Psychosocial Psychosocial Symptoms Reported: Depression - if selected complete PHQ 2-9 Additional Psychological Details: Discussed referral for psychiatry: [facility] psychiatry referral placed   Discussed referral options to connect for ongoing therapy: [facility] therapy referral placed. Patient's referral was declined for scheduling through [facility] staff as patient has been a no show for two new initial appointments at clinic and due to attendance policy, patient will need to come in as a walk in client to enroll in their program. Patient was advised of this and  is agreeable to plan and understands that there will be a wait time with the walk in clinic hours and it is best to go in as early as possible in order to get soon faster.  11/07/23- Patient reports that she is aware of the walk in clinic at [facility] and their hours of operation. She shares that she has not had a chance to go to there yet to engage in treatment yet but was provided  extensive education again during session today on their specific enrollment process due to [facility] attendance policy. Patient is agreeable to a 30 day follow up call to ensure behavioral health needs were met. Crisis resource education provided as well. Behavioral Management Strategies: Adequate rest Behavioral Health Self-Management Outcome: 3 (uncertain) Major Change/Loss/Stressor/Fears (CP): Environment Techniques to Cope with Loss/Stress/Change: Withdraw Quality of Family Relationships: non-existent Do you feel physically threatened by others?: No      11/07/2023    1:50 PM  Depression screen PHQ 2/9  Decreased Interest 2  Down, Depressed, Hopeless 2  PHQ - 2 Score 4  Altered sleeping 1  Tired, decreased energy 1  Change in appetite 1  Feeling bad or failure about yourself  0  Trouble concentrating 1  Moving slowly or fidgety/restless 1  Suicidal thoughts 0  PHQ-9 Score 9  Difficult doing work/chores Somewhat difficult   SDOH Screenings   Food Insecurity: Food Insecurity Present (11/06/2023)  Housing: High Risk (11/07/2023)  Transportation Needs: Unmet Transportation Needs (11/07/2023)  Utilities: Not At Risk (11/06/2023)  Alcohol  Screen: Low Risk  (06/20/2023)  Depression (PHQ2-9): Medium Risk (11/07/2023)  Financial Resource Strain: Medium Risk (11/06/2023)  Physical Activity: Inactive (07/19/2023)  Social Connections: Socially Isolated (11/07/2023)  Stress: Stress Concern Present (11/07/2023)  Tobacco Use: Medium Risk (08/26/2023)  Health Literacy: Inadequate Health Literacy (10/18/2023)    There were no vitals filed for this visit.  Medications Reviewed Today     Reviewed by Merlynn Lyle CROME, LCSW (Social Worker) on 11/07/23 at 1338  Med List Status: <None>   Medication Order Taking? Sig Documenting Provider Last Dose Status Informant  acetaminophen  (TYLENOL ) 500 MG tablet [id number]  Take 1,000 mg by mouth every 6 (six) hours as needed for moderate pain (pain score 4-6). [provider]  Active Self, Multiple Informants, Pharmacy Records  aspirin  81 MG chewable tablet [id number]  Chew 1 tablet (81 mg total) by mouth 2 (two) times daily. Vernetta Lonni GRADE, MD  Active Self, Multiple Informants, Pharmacy Records  atorvastatin  (LIPITOR) 40 MG tablet [id number]  Take 1 tablet (40 mg total) by mouth daily.  Patient not taking: Reported on 10/17/2023   Lucien Orren SAILOR, PA-C  Expired 07/02/23 2359   Bismuth Subsalicylate 262 MG TABS [id number]  Take 2 tablets (524 mg total) by mouth in the morning, at noon, in the evening, and at bedtime for 10 days. Nandigam, Kavitha V, MD  Active   cyclobenzaprine  (FLEXERIL ) 10 MG tablet [id number]  Take 1 tablet (10 mg total) by mouth 3 (three) times daily as needed for muscle spasms. TAKE 1 TABLET BY MOUTH TWICE A DAY AS NEEDED FOR MUSCLE SPASMS Vernetta Lonni GRADE, MD  Active Self, Multiple Informants, Pharmacy Records  diclofenac  (VOLTAREN ) 75 MG EC tablet [id number]  TAKE 1 TABLET BY MOUTH 2 TIMES DAILY AS NEEDED.  Patient not taking: Reported on 10/17/2023   Vernetta Lonni GRADE, MD  Active   doxycycline (VIBRAMYCIN) 100 MG capsule [id number]  Take 1 capsule (100 mg total) by mouth 2 (two) times  daily for 10 days. Nandigam, Kavitha V, MD  Active   ferrous sulfate  325 (65 FE) MG tablet [id number]  Take 1 tablet (325 mg total) by mouth every other day. Harrie Bruckner, DO  Active   gabapentin  (NEURONTIN ) 300 MG capsule [id number]  TAKE 1 CAPSULE BY MOUTH EVERYDAY AT BEDTIME Vernetta Bruckner GRADE, MD  Active   HYDROcodone -acetaminophen  [facility]) 7.5-325 MG tablet [id number]  Take 1 tablet by mouth every 6 (six) hours as needed for moderate pain (pain score 4-6). Vernetta Bruckner GRADE, MD  Active   metroNIDAZOLE  (FLAGYL ) 250 MG tablet [id number]  Take 1 tablet (250 mg total) by mouth 4 (four) times daily for 10 days. Nandigam, Kavitha V, MD  Active   omeprazole (PRILOSEC) 20 MG capsule [id number]  Take 1 capsule (20 mg total) by mouth  in the morning and at bedtime for 10 days. Nandigam, Kavitha V, MD  Active   pantoprazole  (PROTONIX ) 40 MG tablet [id number]  Take 1 tablet (40 mg total) by mouth daily.  Patient not taking: Reported on 10/17/2023   Juberg, Christopher, DO  Active   sertraline  (ZOLOFT ) 25 MG tablet [id number]  Take 1 tablet (25 mg total) by mouth daily. Norrine Sharper, MD  Active   spironolactone  (ALDACTONE ) 25 MG tablet [id number]  TAKE 1 TABLET (25 MG TOTAL) BY MOUTH DAILY. Nahser, Aleene PARAS, MD  Active   VENTOLIN  HFA 108 [phone] Base) MCG/ACT inhaler [id number]  INHALE 2 PUFFS INTO THE LUNGS EVERY 4 HOURS AS NEEDED FOR WHEEZING OR SHORTNESS OF BREATH. Hunsucker, Donnice SAUNDERS, MD  Active             Recommendation:   PCP Follow-up  Follow Up Plan:   Telephone follow-up in 1 month  Lyle Rung, BSW, MSW, LCSW Licensed Clinical Social Worker [employer]   [facility] [email or web address] Direct Dial: [phone]

## 2023-11-13 ENCOUNTER — Ambulatory Visit (INDEPENDENT_AMBULATORY_CARE_PROVIDER_SITE_OTHER): Admitting: Orthopaedic Surgery

## 2023-11-13 ENCOUNTER — Other Ambulatory Visit: Payer: Self-pay

## 2023-11-13 ENCOUNTER — Other Ambulatory Visit (INDEPENDENT_AMBULATORY_CARE_PROVIDER_SITE_OTHER): Payer: Self-pay

## 2023-11-13 ENCOUNTER — Encounter: Payer: Self-pay | Admitting: Orthopaedic Surgery

## 2023-11-13 DIAGNOSIS — M5441 Lumbago with sciatica, right side: Secondary | ICD-10-CM | POA: Diagnosis not present

## 2023-11-13 DIAGNOSIS — G8929 Other chronic pain: Secondary | ICD-10-CM

## 2023-11-13 MED ORDER — METHYLPREDNISOLONE 4 MG PO TABS: ORAL_TABLET | ORAL | 0 refills | Status: DC

## 2023-11-13 NOTE — Progress Notes (Signed)
 The patient is well-known to us .  We have replaced her right knee and her left hip with a right knee just under 5 months ago.  She does ambulate with a rolling walker and she has knee is doing well but she has been having a lot of right sided low back pain and sciatica and it does radiate down to the toes on the right side.  She has never had any type of back surgery before or any type of injections in her back.  She denies any change in bowel or bladder function.  This been going on for several weeks now.  She is on diclofenac  as well as Neurontin  and Flexeril .  She does have a positive straight leg raise on the right side.  She has pain to the posterior muscles and right side of the lower lumbar spine in the paraspinal muscle area.  It does radiate into the sciatic region.  She lacks full extension by few degrees of her right total knee but her flexion is pretty good.  2 views of the lumbar spine show no acute findings but there is degenerative change of the lower lumbar spine especially present L5 and S1.  I would like to put her on a steroid taper which will be a Medrol  Dosepak for 6 days.  I would also like to send her to outpatient physical therapy for any modalities that can decrease her right-sided low back pain and sciatica.  Will then see her back in a month to see how she is doing overall.  She agrees to this treatment plan.

## 2023-11-14 ENCOUNTER — Other Ambulatory Visit: Payer: Self-pay

## 2023-11-14 NOTE — Patient Instructions (Signed)
 Visit Information  Ms. Charlet was given information about Medicaid Managed Care team care coordination services as a part of their [facility] Community Plan Medicaid benefit. [name] verbally consented to engagement with the [facility] Managed Care team.   If you are experiencing a medical emergency, please call 911 or report to your local emergency department or urgent care.   If you have a non-emergency medical problem during routine business hours, please contact your provider's office and ask to speak with a nurse.   For questions related to your [facility], please call: [phone] or visit the homepage here: [email or web address]  If you would like to schedule transportation through your [facility], please call the following number at least 2 days in advance of your appointment: [phone]   Rides for urgent appointments can also be made after hours by calling Member Services.  Call the Behavioral Health Crisis Line at [phone], at any time, 24 hours a day, 7 days a week. If you are in danger or need immediate medical attention call 911.  If you would like help to quit smoking, call 1-800-QUIT-NOW ((959)886-1458) OR Espaol: 1-855-Djelo-Ya (8-144-664-6430) o para ms informacin haga clic aqu or Text READY to 799-599 to register via text  Ms. Colquhoun - following are the goals we discussed in your visit today:   Goals Addressed             This Visit's Progress    BSW VBCI Social Work Care Plan   On track    Problems:   Corporate treasurer , Geophysicist/field seismologist , Housing , and Transportation  CSW Clinical Goal(s):   Over the next 2 weeks the Patient will work with Child psychotherapist to address concerns related to food insecurity, financial strain, housing, and transportation.  Interventions:  BSW will provide patient referrals for food insecurity and financial  strain. BSW provided education on housing resource for [employer]. Patient's goal is to move out of home and live alone.  Patient Goals/Self-Care Activities:  Goal: move to a home by herself with no female roommates. Patient will also call [employer] to request information on how they can help.    Plan:   Telephone follow up appointment with care management team member scheduled for:  11/14/2023 at 10AM.        The patient verbalized understanding of instructions, educational materials, and care plan provided today and DECLINED offer to receive copy of patient instructions, educational materials, and care plan.   Telephone follow up appointment with Managed Medicaid care management team member scheduled for: 11/20/2023 at 1pm  [name], [place]/VBCI - [facility] Social Worker [phone]   Following is a copy of your plan of care:  There are no care plans that you recently modified to display for this patient.

## 2023-11-14 NOTE — Patient Outreach (Signed)
 Complex Care Management   Visit Note  11/14/2023  Name:  Jean [name] MRN: [id number] DOB: 1966/05/03  Situation: Referral received for Complex Care Management related to SDOH Barriers:  Transportation Housing instability Food insecurity Financial Resource Strain I obtained verbal consent from Patient.  Visit completed with patient  on the phone  Background:   Past Medical History:  Diagnosis Date   Adnexal mass 2019   likely remnant fibroid on broad ligament; getting diagnostic lap   Anxiety    on meds   Arthritis    hip-uses OTC meds   Asthma    uses inhaler as needed   Carpal tunnel syndrome    bilateral   Chest wall pain 10/15/2016   Chronic lower back pain    COPD (chronic obstructive pulmonary disease) (HCC)    uses in haler as needed   Depression    on meds   Dyspnea    with exertion   Dysrhythmia    GERD (gastroesophageal reflux disease)    Headache    migraines   Herpes    Hypertension    on meds   Liver lesion, right lobe 12/01/2015   CT chest without contrast 11/30/15 with hepatic lesions measuring 2.0 x 1.7 cm of the right lobe and 1 x 1 cm on the dome. CT abd/pel 8/18: 1.3cm inferior R liver lobe, prob benign but recommend 3-64mo f/u with MRI w/wo IV contrast with attenuation MRI 6/19: combination of benign cavernous hemangiomas and small benign cysts   PONV (postoperative nausea and vomiting)    Poor dental hygiene    chipped upper front and several loose teeth   Seasonal allergies    Sickle cell trait (HCC)    Substance abuse (HCC)    cocaine/crack cocaine - last use 02/07/18   SVD (spontaneous vaginal delivery)    x 1   Tobacco abuse 07/08/2015   Uses walker    for ambulation   Uterine leiomyoma 02/16/2016   s/p vaginal hysterectomy 2018   Vaginal discharge 03/26/2018    Assessment: BSW conducted f/u call with patient. Patient was alert and cognitive. However, she stated she was not in good spirits. BSW asked for clarification and patient  stated she felt overwhelmed. BSW encouraged her to continue appointments with LCSW for on going mental health treatment. Patient reports she went to the doctor yesterday and was given medication for chronic right-sided low back pain with right-sided sciatica an outpaitent PT was recommended. Patient states she will be getting a call regarding PT. BSW confirmed with patient she spoke with [facility] regarding her current housing situation. Patient states she was told they can only help individuals who are homeless (patient was not too sure what they told her). BSW will call [facility] to confirm if they can assist. Patient agreed. BSW and Patient completed referral for food home delivery program through [employer]. Referral form was completed and emailed over to [employer] ([email or web address]). BSW confirmed if patient is aware of how to set up medicaid transportation to which patient stated she is aware and plans to use it for her next appointment. Patient made BSW aware that her daughter came in from [place] to visit her. Patient express desire for daughter to move back to [place] to help alleviate some stress, but patient reports daughter was not open to that idea. No additional resources needed at this time and patient is waiting for BSW food resources to arrive via mail.    SDOH Interventions  Flowsheet Row Patient Outreach Telephone from 11/07/2023 in [place] POPULATION HEALTH DEPARTMENT Patient Outreach Telephone from 11/06/2023 in [place] POPULATION HEALTH DEPARTMENT Patient Outreach Telephone from 10/18/2023 in [place] POPULATION HEALTH DEPARTMENT Patient Outreach Telephone from 10/17/2023 in [place] POPULATION HEALTH DEPARTMENT Office Visit from 07/23/2023 in [facility] [place] Patient Outreach Telephone from 07/19/2023 in [place] POPULATION HEALTH DEPARTMENT  SDOH Interventions        Food Insecurity Interventions -- Community Resources Provided  Intervention Not Indicated Intervention Not Indicated -- --  Housing Interventions Community Resources Provided [employer] Provided AMB Referral  [LCSW provided brief education on [place] [employer] and [employer] resources in her area that assist with housing. Pt wishes for further resource education. Referral VBCI BSW made to assist further with housing resources] Other (Comment)  [safer enviroment] -- --  Transportation Interventions Payor Benefit Payor Benefit  [[employer] number [phone] wrote down by patient .] Payor Benefit  [[employer] number [phone] wrote down by patient] Other (Comment)  [BSW] -- --  Utilities Interventions -- Intervention Not Indicated Intervention Not Indicated Intervention Not Indicated -- --  Depression Interventions/Treatment  Medication, Community Resources Provided -- Medication, Referral to Psychiatry Medication Medication --  Financial Strain Interventions -- [employer] Provided -- -- -- --  Physical Activity Interventions -- -- -- -- -- Intervention Not Indicated  [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 She reports that she applied for this program recently and was declined because she could put on her clothes without issues] -- -- --      Recommendation:   Continue LCSW appointments to help manage depression/anxiety and other mental health needs.   Follow Up Plan:   Telephone follow up appointment date/time:  11/20/2023 at 1pm  [name], BSW [place]/VBCI - [facility] Social Worker [phone]

## 2023-11-18 ENCOUNTER — Ambulatory Visit: Payer: Self-pay | Admitting: Student

## 2023-11-19 ENCOUNTER — Ambulatory Visit: Payer: Self-pay | Admitting: Student

## 2023-11-19 ENCOUNTER — Ambulatory Visit: Attending: Orthopaedic Surgery

## 2023-11-20 ENCOUNTER — Other Ambulatory Visit: Payer: Self-pay

## 2023-11-20 ENCOUNTER — Ambulatory Visit: Payer: Self-pay | Admitting: Student

## 2023-11-20 NOTE — Patient Outreach (Signed)
 Complex Care Management   Visit Note  11/20/2023  Name:  Jean [name] MRN: [id number] DOB: 10/27/1965  Situation: Referral received for Complex Care Management related to SDOH Barriers:  Transportation Housing finding housing. Food insecurity Physicist, medical Strain I obtained verbal consent from Patient.  Visit completed with patient  on the phone  Background:   Past Medical History:  Diagnosis Date   Adnexal mass 2019   likely remnant fibroid on broad ligament; getting diagnostic lap   Anxiety    on meds   Arthritis    hip-uses OTC meds   Asthma    uses inhaler as needed   Carpal tunnel syndrome    bilateral   Chest wall pain 10/15/2016   Chronic lower back pain    COPD (chronic obstructive pulmonary disease) (HCC)    uses in haler as needed   Depression    on meds   Dyspnea    with exertion   Dysrhythmia    GERD (gastroesophageal reflux disease)    Headache    migraines   Herpes    Hypertension    on meds   Liver lesion, right lobe 12/01/2015   CT chest without contrast 11/30/15 with hepatic lesions measuring 2.0 x 1.7 cm of the right lobe and 1 x 1 cm on the dome. CT abd/pel 8/18: 1.3cm inferior R liver lobe, prob benign but recommend 3-50mo f/u with MRI w/wo IV contrast with attenuation MRI 6/19: combination of benign cavernous hemangiomas and small benign cysts   PONV (postoperative nausea and vomiting)    Poor dental hygiene    chipped upper front and several loose teeth   Seasonal allergies    Sickle cell trait (HCC)    Substance abuse (HCC)    cocaine/crack cocaine - last use 02/07/18   SVD (spontaneous vaginal delivery)    x 1   Tobacco abuse 07/08/2015   Uses walker    for ambulation   Uterine leiomyoma 02/16/2016   s/p vaginal hysterectomy 2018   Vaginal discharge 03/26/2018    Assessment: BSW held f/u call with patient. Patient was alert and cognitive. Patient stated she received BSW food resources. BSW and patient reviewed those together  over the phone. Patient was explained how to read and understand the distribution schedule for Out of the [facility] and was referred to [employer] for food pantry services tomorrow from 9am - 2pm. Patient agreed to make an attempt to go. Patient states her daughter is in [place] and plans to move to [place]. Patient states she relies on her for transportation at the moment.   BSW informed patient that Devere Free with [employer] received referral for home delivery program through their Community Support Nutrition Program (CSNP) last week. Devere stated she would likely get placed on route for distribution on 07/16. Patient was advised to be on the lookout for a box of food or let BSW know if she did not receive anything for f/u w/ Devere. Patient agreed.  BSW will reach out to [facility] and [place] housing coalition for possible assistance. No other resources provided at this time.   SDOH Interventions    Flowsheet Row Patient Outreach Telephone from 11/07/2023 in [place] POPULATION HEALTH DEPARTMENT Patient Outreach Telephone from 11/06/2023 in [place] POPULATION HEALTH DEPARTMENT Patient Outreach Telephone from 10/18/2023 in [place] POPULATION HEALTH DEPARTMENT Patient Outreach Telephone from 10/17/2023 in  POPULATION HEALTH DEPARTMENT Office Visit from 07/23/2023 in [facility] [place] Patient Outreach Telephone from 07/19/2023 in [place] POPULATION HEALTH DEPARTMENT  SDOH Interventions        Food Insecurity Interventions -- Community Resources Provided Intervention Not Indicated Intervention Not Indicated -- --  Housing Interventions Community Resources Provided [employer] Provided AMB Referral  [LCSW provided brief education on [place] [employer] and [employer] resources in her area that assist with housing. Pt wishes for further resource education. Referral [facility] BSW made to assist further with housing  resources] Other (Comment)  [safer enviroment] -- --  Transportation Interventions Payor Benefit Payor Benefit  [[employer] number [phone] wrote down by patient .] Payor Benefit  [[employer] number [phone] wrote down by patient] Other (Comment)  [BSW] -- --  Utilities Interventions -- Intervention Not Indicated Intervention Not Indicated Intervention Not Indicated -- --  Depression Interventions/Treatment  Medication, Community Resources Provided -- Medication, Referral to Psychiatry Medication Medication --  Financial Strain Interventions -- [employer] Provided -- -- -- --  Physical Activity Interventions -- -- -- -- -- Intervention Not Indicated  [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 She reports that she applied for this program recently and was declined because she could put on her clothes without issues] -- -- --     Recommendation:   Obtain food through food resources provided by mail to patient.   Follow Up Plan:   Telephone follow up appointment date/time:  11/28/2023 at 1pm  [name], BSW [place]/[facility] - [facility] Social Worker [phone]

## 2023-11-20 NOTE — Patient Outreach (Signed)
 BSW called patient bacl to let her know that he called [employer] to inquire about housing assistance for patient. Per [facility], waitlist is closed for all other applicants but open for 62 and up. Patient does not meet age requirement at the moment to be placed on senior waitlist. BSW also called [place] housing coalition and was not able to get through but left a VM for Kenvir requesting a call back. Patient agreed and understood.

## 2023-11-20 NOTE — Patient Instructions (Signed)
 Visit Information  [name] was given information about Medicaid Managed Care team care coordination services as a part of their [facility] Community Plan Medicaid benefit. [name] verbally consented to engagement with the [facility] Managed Care team.   If you are experiencing a medical emergency, please call 911 or report to your local emergency department or urgent care.   If you have a non-emergency medical problem during routine business hours, please contact your provider's office and ask to speak with a nurse.   For questions related to your [facility], please call: [phone] or visit the homepage here: [email or web address]  If you would like to schedule transportation through your [facility], please call the following number at least 2 days in advance of your appointment: [phone]   Rides for urgent appointments can also be made after hours by calling Member Services.  Call the Behavioral Health Crisis Line at [phone], at any time, 24 hours a day, 7 days a week. If you are in danger or need immediate medical attention call 911.  If you would like help to quit smoking, call 1-800-QUIT-NOW (701-751-7518) OR Espaol: 1-855-Djelo-Ya (8-144-664-6430) o para ms informacin haga clic aqu or Text READY to 799-599 to register via text  [name] - following are the goals we discussed in your visit today:   Goals Addressed   None     The patient verbalized understanding of instructions, educational materials, and care plan provided today and DECLINED offer to receive copy of patient instructions, educational materials, and care plan.   Telephone follow up appointment with Managed Medicaid care management team member scheduled for:11/28/2023 at 1pm  [name], [place]/[facility]BCI - [facility] Social Worker [phone]   Following  is a copy of your plan of care:  There are no care plans that you recently modified to display for this patient.

## 2023-11-25 ENCOUNTER — Telehealth: Payer: Self-pay | Admitting: Orthopaedic Surgery

## 2023-11-25 ENCOUNTER — Other Ambulatory Visit: Payer: Self-pay

## 2023-11-25 DIAGNOSIS — M545 Low back pain, unspecified: Secondary | ICD-10-CM

## 2023-11-25 NOTE — Telephone Encounter (Signed)
 Patient called and said to tell you she is in a lot of pain. CB#[phone]

## 2023-11-25 NOTE — Telephone Encounter (Signed)
 Called patient to advise. She is aware. Order made. They will call her to schedule appt.

## 2023-11-26 ENCOUNTER — Encounter (HOSPITAL_COMMUNITY): Payer: Self-pay

## 2023-11-26 ENCOUNTER — Other Ambulatory Visit: Payer: Self-pay

## 2023-11-26 ENCOUNTER — Emergency Department (HOSPITAL_COMMUNITY)

## 2023-11-26 ENCOUNTER — Emergency Department (HOSPITAL_COMMUNITY): Admission: EM | Admit: 2023-11-26 | Discharge: 2023-11-27 | Disposition: A | Discharge status: Home / Self Care

## 2023-11-26 DIAGNOSIS — Z7982 Long term (current) use of aspirin: Secondary | ICD-10-CM | POA: Insufficient documentation

## 2023-11-26 DIAGNOSIS — M545 Low back pain, unspecified: Secondary | ICD-10-CM | POA: Insufficient documentation

## 2023-11-26 DIAGNOSIS — R42 Dizziness and giddiness: Secondary | ICD-10-CM | POA: Insufficient documentation

## 2023-11-26 DIAGNOSIS — R112 Nausea with vomiting, unspecified: Secondary | ICD-10-CM | POA: Insufficient documentation

## 2023-11-26 DIAGNOSIS — M51362 Other intervertebral disc degeneration, lumbar region with discogenic back pain and lower extremity pain: Secondary | ICD-10-CM

## 2023-11-26 DIAGNOSIS — G8929 Other chronic pain: Secondary | ICD-10-CM

## 2023-11-26 DIAGNOSIS — R911 Solitary pulmonary nodule: Secondary | ICD-10-CM

## 2023-11-26 LAB — COMPREHENSIVE METABOLIC PANEL WITH GFR
ALT: 12 U/L (ref 0–44)
AST: 16 U/L (ref 15–41)
Albumin: 3.5 g/dL (ref 3.5–5.0)
Alkaline Phosphatase: 77 U/L (ref 38–126)
Anion gap: 11 (ref 5–15)
BUN: 16 mg/dL (ref 6–20)
CO2: 17 mmol/L — ABNORMAL LOW (ref 22–32)
Calcium: 9.5 mg/dL (ref 8.9–10.3)
Chloride: 110 mmol/L (ref 98–111)
Creatinine, Ser: 1.09 mg/dL — ABNORMAL HIGH (ref 0.44–1.00)
GFR, Estimated: 58 mL/min — ABNORMAL LOW (ref >60)
Glucose, Bld: 93 mg/dL (ref 70–99)
Potassium: 4.2 mmol/L (ref 3.5–5.1)
Sodium: 138 mmol/L (ref 135–145)
Total Bilirubin: 0.6 mg/dL (ref 0.0–1.2)
Total Protein: 7.2 g/dL (ref 6.5–8.1)

## 2023-11-26 LAB — CBC
HCT: 41.9 % (ref 36.0–46.0)
Hemoglobin: 13.3 g/dL (ref 12.0–15.0)
MCH: 31.6 pg (ref 26.0–34.0)
MCHC: 31.7 g/dL (ref 30.0–36.0)
MCV: 99.5 fL (ref 80.0–100.0)
Platelets: 311 K/uL (ref 150–400)
RBC: 4.21 MIL/uL (ref 3.87–5.11)
RDW: 14 % (ref 11.5–15.5)
WBC: 6.2 K/uL (ref 4.0–10.5)
nRBC: 0 % (ref 0.0–0.2)

## 2023-11-26 LAB — TROPONIN I (HIGH SENSITIVITY): Troponin I (High Sensitivity): 7 ng/L (ref <18)

## 2023-11-26 LAB — LIPASE, BLOOD: Lipase: 31 U/L (ref 11–51)

## 2023-11-26 MED ORDER — METHOCARBAMOL 500 MG PO TABS
500.0000 mg | ORAL_TABLET | Freq: Once | ORAL | Status: AC
  Administered 2023-11-26: 500 mg via ORAL
  Filled 2023-11-26: qty 1

## 2023-11-26 MED ORDER — FENTANYL CITRATE PF 50 MCG/ML IJ SOSY
50.0000 ug | PREFILLED_SYRINGE | Freq: Once | INTRAMUSCULAR | Status: AC
  Administered 2023-11-26: 50 ug via INTRAVENOUS
  Filled 2023-11-26: qty 1

## 2023-11-26 MED ORDER — OXYCODONE-ACETAMINOPHEN 5-325 MG PO TABS
1.0000 | ORAL_TABLET | Freq: Once | ORAL | Status: AC
  Administered 2023-11-26: 1 via ORAL
  Filled 2023-11-26: qty 1

## 2023-11-26 MED ORDER — IOHEXOL 350 MG/ML SOLN
75.0000 mL | Freq: Once | INTRAVENOUS | Status: AC | PRN
  Administered 2023-11-26: 75 mL via INTRAVENOUS

## 2023-11-26 MED ORDER — ACETAMINOPHEN 325 MG PO TABS
650.0000 mg | ORAL_TABLET | Freq: Once | ORAL | Status: AC
  Administered 2023-11-26: 650 mg via ORAL
  Filled 2023-11-26: qty 2

## 2023-11-26 MED ORDER — SODIUM CHLORIDE 0.9 % IV BOLUS: 1000.0000 mL | Freq: Once | INTRAVENOUS | Status: DC

## 2023-11-26 NOTE — ED Provider Notes (Signed)
  Physical Exam  BP (!) 204/101 (BP Location: Right Arm)   Pulse (!) 40   Temp 98.1 F (36.7 C) (Oral)   Resp (!) 9   Ht 5' 3 (1.6 m)   Wt 61.7 kg   LMP  (LMP Unknown)   SpO2 99%   BMI 24.09 kg/m   Physical Exam  Procedures  Procedures  ED Course / MDM   Clinical Course as of 11/26/23 1541  Tue Nov 26, 2023  1307 Saw ortho  [TY]  1359 CBC Admitted several months ago for anemia thought to be secondary to GI bleed.  Appears on the CBC hemoglobin normal today. [TY]    Clinical Course User Index [TY] Neysa Caron PARAS, DO   Medical Decision Making Amount and/or Complexity of Data Reviewed Labs: ordered. Decision-making details documented in ED Course. Radiology: ordered.  Risk OTC drugs. Prescription drug management.   30F presenting with 2.5 weeks of worsening back pain, now with weakness in RLE. MRI pending. Feel less likely cauda equina syndrome. Sinus bradycardia noted.   Pt assessed bedside, now complaining of chest pain in addition to back pain, weakness in RLE with chest pain and back pain, will obtain CTA Dissection Study and cardiac enzymes.  Labs: CBC without leukocytosis or anemia, CMP with a non-anion gap acidosis with a bicarb of 17, anion gap 11, lipase normal, cardiac troponin 7, repeat troponin pending, urinalysis pending.  CTA Dissection:  IMPRESSION:  1. No acute thoracic or abdominal aorta abnormality.  2. No pulmonary embolus.  3. Aortic valve leaflet calcifications-correlate for aortic  stenosis.  4. Cholelithiasis. Question gallbladder fundus wall thickening.  Correlate with liver function tests and consider right upper  quadrant ultrasound for further evaluation.  5. Less than 6 mm right solid pulmonary nodule within the upper  lobe. Per Fleischner Society Guidelines, if patient is low risk for  malignancy, no routine follow-up imaging is recommended. If patient  is high risk for malignancy, a non-contrast Chest CT at 12 months is  optional.  If performed and the nodule is stable at 12 months, no  further follow-up is recommended. These guidelines do not apply to  immunocompromised patients and patients with cancer. Follow up in  patients with significant comorbidities as clinically warranted. For  lung cancer screening, adhere to Lung-RADS guidelines. Reference:  Radiology. 2017; 284(1):228-43.    MR Lumbar Spine:  IMPRESSION:  Degenerative changes of the lumbar spine as above. Disc bulge and  facet arthrosis at L4-5 resulting in moderate spinal canal stenosis  which is increased since 2021.    Facet arthrosis throughout the lumbar spine with facet effusions  noted at L3-4 and L4-5.    Synovial cysts adjacent to the left facets at L4-5 projecting into  the paraspinal musculature without contribution to spinal canal  narrowing.    Transitional lumbosacral anatomy.   Plan for likely discharge pending results of repeat cardiac troponin, urinalysis.  Plan for outpatient follow-up with neurosurgery in the setting the patient's MR spine findings. Signout given to Dr. Melvenia pending results of labs.      Jerrol Agent, MD 11/26/23 2228

## 2023-11-26 NOTE — ED Notes (Signed)
 Went to MRI

## 2023-11-26 NOTE — ED Notes (Signed)
 Pt ambulated to the bathroom using a walker. Pt will collect urine sample next time to the bathroom.

## 2023-11-26 NOTE — ED Provider Notes (Signed)
 [place] EMERGENCY DEPARTMENT AT [facility] Provider Note   CSN: [id number] Arrival date & time: 11/26/23  1251     Patient presents with: Back Pain and Dizziness   Jean [name] is a 58 y.o. female.   This is a 58 year old female presenting emergency department for right lower back pain.  Reports symptoms for the past 2 to 3 weeks.  No trauma or inciting event that she can recall.  No constitutional symptoms, she notes numbness down the front of her right leg to her great toe.  She also notes that she has been seemingly having some weakness with her right leg as compared to her left since onset as well.  No bowel or bladder incontinence.  No saddle anesthesia.  Saw her orthopedic doctor who manages her knee pain who placed her on steroid in addition to Flexeril  and diclofenac .  Reports symptoms worsened today.  She is supposed to have an outpatient MRI done in a couple weeks, but did not think she could wait that long.  Reports that some of the pain medications given to her by her primary doctor make her feel lightheaded and make her nauseated.  She had several episodes of vomiting.  She denies any abdominal pain to me.   Back Pain Dizziness      Prior to Admission medications   Medication Sig Start Date End Date Taking? Authorizing Provider  acetaminophen  (TYLENOL ) 500 MG tablet Take 1,000 mg by mouth every 6 (six) hours as needed for moderate pain (pain score 4-6).    [provider]  aspirin  81 MG chewable tablet Chew 1 tablet (81 mg total) by mouth 2 (two) times daily. 07/04/23   Vernetta Lonni GRADE, MD  atorvastatin  (LIPITOR) 40 MG tablet Take 1 tablet (40 mg total) by mouth daily. Patient not taking: Reported on 10/17/2023 11/21/22 07/02/23  Conte, Tessa N, PA-C  cyclobenzaprine  (FLEXERIL ) 10 MG tablet Take 1 tablet (10 mg total) by mouth 3 (three) times daily as needed for muscle spasms. TAKE 1 TABLET BY MOUTH TWICE A DAY AS NEEDED FOR MUSCLE SPASMS  07/04/23   Vernetta Lonni GRADE, MD  diclofenac  (VOLTAREN ) 75 MG EC tablet TAKE 1 TABLET BY MOUTH 2 TIMES DAILY AS NEEDED. Patient not taking: Reported on 10/17/2023 09/11/23   Vernetta Lonni GRADE, MD  ferrous sulfate  325 (65 FE) MG tablet Take 1 tablet (325 mg total) by mouth every other day. 11/04/23   Harrie Lonni, DO  gabapentin  (NEURONTIN ) 300 MG capsule TAKE 1 CAPSULE BY MOUTH EVERYDAY AT BEDTIME 08/19/23   Vernetta Lonni GRADE, MD  HYDROcodone -acetaminophen  (NORCO) 7.5-325 MG tablet Take 1 tablet by mouth every 6 (six) hours as needed for moderate pain (pain score 4-6). 09/09/23   Vernetta Lonni GRADE, MD  methylPREDNISolone  (MEDROL ) 4 MG tablet Medrol  dose pack. Take as instructed 11/13/23   Vernetta Lonni GRADE, MD  omeprazole  (PRILOSEC) 20 MG capsule Take 1 capsule (20 mg total) by mouth in the morning and at bedtime for 10 days. 11/04/23 11/14/23  Nandigam, Kavitha V, MD  pantoprazole  (PROTONIX ) 40 MG tablet Take 1 tablet (40 mg total) by mouth daily. Patient not taking: Reported on 10/17/2023 07/23/23   Harrie Lonni, DO  sertraline  (ZOLOFT ) 25 MG tablet Take 1 tablet (25 mg total) by mouth daily. 09/09/23 03/07/24  Norrine Sharper, MD  spironolactone  (ALDACTONE ) 25 MG tablet TAKE 1 TABLET (25 MG TOTAL) BY MOUTH DAILY. 07/17/23   Nahser, Aleene PARAS, MD  VENTOLIN  HFA 108 [phone] Base) MCG/ACT  inhaler INHALE 2 PUFFS INTO THE LUNGS EVERY 4 HOURS AS NEEDED FOR WHEEZING OR SHORTNESS OF BREATH. 10/29/23   Hunsucker, Donnice SAUNDERS, MD    Allergies: Hydrocodone  and Lisinopril    Review of Systems  Musculoskeletal:  Positive for back pain.  Neurological:  Positive for dizziness.    Updated Vital Signs BP (!) 204/101 (BP Location: Right Arm)   Pulse (!) 40   Temp 98.1 F (36.7 C) (Oral)   Resp (!) 9   Ht 5' 3 (1.6 m)   Wt 61.7 kg   LMP  (LMP Unknown)   SpO2 99%   BMI 24.09 kg/m   Physical Exam Vitals and nursing note reviewed.  Constitutional:      Appearance: She is not  toxic-appearing.  HENT:     Head: Normocephalic.     Nose: Nose normal.     Mouth/Throat:     Mouth: Mucous membranes are moist.  Eyes:     Conjunctiva/sclera: Conjunctivae normal.  Cardiovascular:     Rate and Rhythm: Normal rate and regular rhythm.  Pulmonary:     Effort: Pulmonary effort is normal.  Abdominal:     General: Abdomen is flat. There is no distension.     Tenderness: There is no abdominal tenderness. There is no guarding or rebound.  Musculoskeletal:     Comments: 5 out of 5 plantarflexion on left, 4-5 on the right.  Some weakness picking leg up off the bed on right compared to left, but able to do so.  Normal sensation in lower extremities.  No midline spinal tenderness.  Does have some diffuse tenderness to the quadratus lumborum and across the iliac crest on the right side  Skin:    Capillary Refill: Capillary refill takes less than 2 seconds.  Neurological:     Mental Status: She is alert and oriented to person, place, and time.  Psychiatric:        Mood and Affect: Mood normal.        Behavior: Behavior normal.     (all labs ordered are listed, but only abnormal results are displayed) Labs Reviewed  COMPREHENSIVE METABOLIC PANEL WITH GFR - Abnormal; Notable for the following components:      Result Value   CO2 17 (*)    Creatinine, Ser 1.09 (*)    GFR, Estimated 58 (*)    All other components within normal limits  CBC  LIPASE, BLOOD  URINALYSIS, ROUTINE W REFLEX MICROSCOPIC    EKG: EKG Interpretation Date/Time:  Tuesday November 26 2023 13:08:55 EDT Ventricular Rate:  43 PR Interval:  154 QRS Duration:  88 QT Interval:  473 QTC Calculation: 400 R Axis:   -16  Text Interpretation: Sinus bradycardia Borderline left axis deviation Probable anteroseptal infarct, old Nonspecific T abnormalities, lateral leads ST elevation, consider inferior injury Confirmed by Jean Clap [phone]) on 11/26/2023 1:10:50 PM  Radiology: DG Chest Portable 1 View Result  Date: 11/26/2023 CLINICAL DATA:  Lightheaded. EXAM: PORTABLE CHEST 1 VIEW COMPARISON:  04/18/2021 FINDINGS: The cardiomediastinal contours are normal. The lungs are clear. Pulmonary vasculature is normal. No consolidation, pleural effusion, or pneumothorax. Remote seventh left rib fracture. No acute osseous abnormalities are seen. IMPRESSION: No active disease. Electronically Signed   By: Andrea Gasman M.D.   On: 11/26/2023 14:05     Procedures   Medications Ordered in the ED  acetaminophen  (TYLENOL ) tablet 650 mg (650 mg Oral Given 11/26/23 1343)  methocarbamol  (ROBAXIN ) tablet 500 mg (500 mg Oral Given 11/26/23  1343)  oxyCODONE -acetaminophen  (PERCOCET/ROXICET) 5-325 MG per tablet 1 tablet (1 tablet Oral Given 11/26/23 1343)    Clinical Course as of 11/26/23 1530  Tue Nov 26, 2023  1307 Saw ortho  [TY]  1359 CBC Admitted several months ago for anemia thought to be secondary to GI bleed.  Appears on the CBC hemoglobin normal today. [TY]    Clinical Course User Index [TY] Jean Caron PARAS, DO                                 Medical Decision Making Is a 58 year old female with back pain.  She is afebrile, bradycardic, hypertensive.  Received 100 mcg of fentanyl  with EMS.  EKG appears to be sinus bradycardia.  Not having chest pain or shortness of breath.  Not taking medications to slow heart rate.  Per chart review it appears she has been seeing Ortho outpatient, Ortho is recommending MRI per their note.  Triage note mentions lightheadedness and abdominal pain.  Patient reports not having any current abdominal pain, and has been having some intermittent lightheadness but not currently. On exam questionable weakness on the right lower extremity compared to the left.  Difficult to say if weakness is volitional/pain related versus true weakness.  She does have normal sensation in lower extremities.  Will get screening labs as well as MRI lumbar spine.   Care signed out to afternoon team; dispo  pending MRI and re-eval.    Amount and/or Complexity of Data Reviewed External Data Reviewed:     Details: See above Labs: ordered. Decision-making details documented in ED Course. Radiology: ordered. Decision-making details documented in ED Course. ECG/medicine tests: independent interpretation performed.    Details: Sinus bradycardia.  Normal intervals.  No ST segment changes to indicate ischemia.  No QTc prolongation.  Risk OTC drugs. Prescription drug management. Decision regarding hospitalization. Diagnosis or treatment significantly limited by social determinants of health. Risk Details: Poor health literacy      Final diagnoses:  None    ED Discharge Orders     None          Jean Caron PARAS, DO 11/26/23 1530

## 2023-11-26 NOTE — ED Provider Notes (Incomplete)
 Care of patient assumed from Dr. Jerrol.  This patient has been worked up for back pain.  She is appropriate for outpatient follow-up with neurosurgery.  While in the ED, she endorsed chest pain and is currently awaiting delta troponin and urinalysis.  Discharge is anticipated. Physical Exam  BP (!) 157/102   Pulse (!) 58   Temp 98.6 F (37 C) (Oral)   Resp 20   Ht 5' 3 (1.6 m)   Wt 61.7 kg   LMP  (LMP Unknown)   SpO2 100%   BMI 24.09 kg/m   Physical Exam Vitals and nursing note reviewed.  Constitutional:      General: She is not in acute distress.    Appearance: Normal appearance. She is well-developed. She is not ill-appearing, toxic-appearing or diaphoretic.  HENT:     Head: Normocephalic and atraumatic.     Right Ear: External ear normal.     Left Ear: External ear normal.     Nose: Nose normal.     Mouth/Throat:     Mouth: Mucous membranes are moist.  Eyes:     Extraocular Movements: Extraocular movements intact.     Conjunctiva/sclera: Conjunctivae normal.  Cardiovascular:     Rate and Rhythm: Normal rate and regular rhythm.  Pulmonary:     Effort: Pulmonary effort is normal. No respiratory distress.  Abdominal:     General: There is no distension.     Palpations: Abdomen is soft.  Musculoskeletal:        General: No swelling or deformity.     Cervical back: Normal range of motion and neck supple.  Skin:    General: Skin is warm and dry.     Coloration: Skin is not jaundiced or pale.  Neurological:     General: No focal deficit present.     Mental Status: She is alert and oriented to person, place, and time.  Psychiatric:        Mood and Affect: Mood normal.        Behavior: Behavior normal.     Procedures  Procedures  ED Course / MDM   Clinical Course as of 11/26/23 2331  Tue Nov 26, 2023  1307 Saw ortho  [TY]  1359 CBC Admitted several months ago for anemia thought to be secondary to GI bleed.  Appears on the CBC hemoglobin normal today. [TY]     Clinical Course User Index [TY] Neysa Caron PARAS, DO   Medical Decision Making Amount and/or Complexity of Data Reviewed Labs: ordered. Decision-making details documented in ED Course. Radiology: ordered.  Risk OTC drugs. Prescription drug management.   On assessment, patient resting comfortably on ED stretcher.  Blood pressure has improved since arrival.  She does have ongoing chest discomfort.  GI cocktail was ordered.  Ongoing multimodal pain control was ordered for her back pain.  Urinalysis shows no evidence of infection.  Repeat troponin was normal.  Patient was discharged in stable condition.       Melvenia Motto, MD 11/27/23 9781    Melvenia Motto, MD 11/27/23 [phone]

## 2023-11-26 NOTE — ED Triage Notes (Signed)
 Pt here for lower back pain that radiates to right flank for 2 weeks.  Pt stopped abx due to dizziness. EMS gave 100 mcgs of fentanyl . Sinus brady 30's. Axox4.

## 2023-11-26 NOTE — Discharge Instructions (Addendum)
 Your CTA revealed an incidental finding of a pulmonary nodule: IMPRESSION:  1. No acute thoracic or abdominal aorta abnormality.  2. No pulmonary embolus.  3. Aortic valve leaflet calcifications-correlate for aortic  stenosis.  4. Cholelithiasis. Question gallbladder fundus wall thickening.  Correlate with liver function tests and consider right upper  quadrant ultrasound for further evaluation.  5. Less than 6 mm right solid pulmonary nodule within the upper  lobe. Per Fleischner Society Guidelines, if patient is low risk for  malignancy, no routine follow-up imaging is recommended. If patient  is high risk for malignancy, a non-contrast Chest CT at 12 months is  optional. If performed and the nodule is stable at 12 months, no  further follow-up is recommended. These guidelines do not apply to  immunocompromised patients and patients with cancer. Follow up in  patients with significant comorbidities as clinically warranted. For  lung cancer screening, adhere to Lung-RADS guidelines. Reference:  Radiology. 2017; 284(1):228-43.    MR of the spine revealed: IMPRESSION:  Degenerative changes of the lumbar spine as above. Disc bulge and  facet arthrosis at L4-5 resulting in moderate spinal canal stenosis  which is increased since 2021.    Facet arthrosis throughout the lumbar spine with facet effusions  noted at L3-4 and L4-5.    Synovial cysts adjacent to the left facets at L4-5 projecting into  the paraspinal musculature without contribution to spinal canal  narrowing.    Transitional lumbosacral anatomy.   Follow-up with neurosurgery outpatient regarding these findings.

## 2023-11-27 ENCOUNTER — Encounter: Payer: Self-pay | Admitting: Orthopaedic Surgery

## 2023-11-27 LAB — URINALYSIS, ROUTINE W REFLEX MICROSCOPIC
Bilirubin Urine: NEGATIVE
Glucose, UA: NEGATIVE mg/dL
Hgb urine dipstick: NEGATIVE
Ketones, ur: NEGATIVE mg/dL
Leukocytes,Ua: NEGATIVE
Nitrite: NEGATIVE
Protein, ur: NEGATIVE mg/dL
Specific Gravity, Urine: >1.046 — ABNORMAL HIGH (ref 1.005–1.030)
pH: 5 (ref 5.0–8.0)

## 2023-11-27 LAB — TROPONIN I (HIGH SENSITIVITY): Troponin I (High Sensitivity): 8 ng/L (ref <18)

## 2023-11-27 MED ORDER — OXYCODONE-ACETAMINOPHEN 5-325 MG PO TABS: 1.0000 | ORAL_TABLET | Freq: Three times a day (TID) | ORAL | 0 refills | Status: AC | PRN

## 2023-11-27 MED ORDER — PREDNISONE 20 MG PO TABS
60.0000 mg | ORAL_TABLET | Freq: Once | ORAL | Status: AC
  Administered 2023-11-27: 60 mg via ORAL
  Filled 2023-11-27: qty 3

## 2023-11-27 MED ORDER — METHOCARBAMOL 500 MG PO TABS
1000.0000 mg | ORAL_TABLET | Freq: Three times a day (TID) | ORAL | 0 refills | Status: DC | PRN
Start: 2023-11-27 — End: 2024-01-27

## 2023-11-27 MED ORDER — PREDNISONE 10 MG (21) PO TBPK: ORAL_TABLET | Freq: Every day | ORAL | 0 refills | Status: DC

## 2023-11-27 MED ORDER — LIDOCAINE VISCOUS HCL 2 % MT SOLN
15.0000 mL | Freq: Once | OROMUCOSAL | Status: AC
  Administered 2023-11-27: 15 mL via ORAL
  Filled 2023-11-27: qty 15

## 2023-11-27 MED ORDER — ONDANSETRON HCL 4 MG/2ML IJ SOLN
4.0000 mg | Freq: Once | INTRAMUSCULAR | Status: AC
  Administered 2023-11-27: 4 mg via INTRAVENOUS
  Filled 2023-11-27: qty 2

## 2023-11-27 MED ORDER — KETOROLAC TROMETHAMINE 15 MG/ML IJ SOLN
15.0000 mg | Freq: Once | INTRAMUSCULAR | Status: AC
  Administered 2023-11-27: 15 mg via INTRAVENOUS
  Filled 2023-11-27: qty 1

## 2023-11-27 MED ORDER — OXYCODONE-ACETAMINOPHEN 5-325 MG PO TABS
1.0000 | ORAL_TABLET | Freq: Once | ORAL | Status: AC
  Administered 2023-11-27: 1 via ORAL
  Filled 2023-11-27: qty 1

## 2023-11-27 MED ORDER — MELOXICAM 7.5 MG PO TABS: 7.5000 mg | ORAL_TABLET | Freq: Every day | ORAL | 0 refills | Status: AC

## 2023-11-27 MED ORDER — ALUM & MAG HYDROXIDE-SIMETH 200-200-20 MG/5ML PO SUSP
30.0000 mL | Freq: Once | ORAL | Status: AC
  Administered 2023-11-27: 30 mL via ORAL
  Filled 2023-11-27: qty 30

## 2023-11-27 NOTE — ED Notes (Signed)
 This paramedic went to administer the oral meds ordered and pt states she feels  queasy and can't take them right now

## 2023-11-27 NOTE — ED Notes (Signed)
 This paramedic tried to obtain second trop with no success; phlebotomy contacted

## 2023-11-27 NOTE — ED Notes (Signed)
 Pt encouraged to urinate so we can get a sample

## 2023-11-27 NOTE — ED Notes (Signed)
 Pt provided with food and soda

## 2023-11-28 ENCOUNTER — Other Ambulatory Visit: Payer: Self-pay

## 2023-11-28 NOTE — Patient Instructions (Signed)
 Visit Information  Jean [name] was given information about Medicaid Managed Care team care coordination services as a part of their [facility] Community Plan Medicaid benefit. Jean [name] verbally consented to engagement with the [facility] Managed Care team.   If you are experiencing a medical emergency, please call 911 or report to your local emergency department or urgent care.   If you have a non-emergency medical problem during routine business hours, please contact your provider's office and ask to speak with a nurse.   For questions related to your [facility], please call: [phone] or visit the homepage here: [email or web address]  If you would like to schedule transportation through your [facility], please call the following number at least 2 days in advance of your appointment: [phone]   Rides for urgent appointments can also be made after hours by calling Member Services.  Call the Behavioral Health Crisis Line at [phone], at any time, 24 hours a day, 7 days a week. If you are in danger or need immediate medical attention call 911.  If you would like help to quit smoking, call 1-800-QUIT-NOW (530-596-4551) OR Espaol: 1-855-Djelo-Ya (8-144-664-6430) o para ms informacin haga clic aqu or Text READY to 799-599 to register via text  Ms. Vonbehren - following are the goals we discussed in your visit today:   Goals Addressed   None     The patient verbalized understanding of instructions, educational materials, and care plan provided today and DECLINED offer to receive copy of patient instructions, educational materials, and care plan.   Telephone follow up appointment with Managed Medicaid care management team member scheduled for: 12/06/2023 at1:15pm  [name], [place]/VBCI - [facility] Social  Worker [phone]   Following is a copy of your plan of care:  There are no care plans that you recently modified to display for this patient.

## 2023-11-28 NOTE — Patient Outreach (Addendum)
 Complex Care Management   Visit Note  11/28/2023  Name:  Jean [name] MRN: [id number] DOB: 11/24/1965  Situation: Referral received for Complex Care Management related to SDOH Barriers:  Food insecurity housing I obtained verbal consent from Patient.  Visit completed with patient  on the phone  Background:   Past Medical History:  Diagnosis Date   Adnexal mass 2019   likely remnant fibroid on broad ligament; getting diagnostic lap   Anxiety    on meds   Arthritis    hip-uses OTC meds   Asthma    uses inhaler as needed   Carpal tunnel syndrome    bilateral   Chest wall pain 10/15/2016   Chronic lower back pain    COPD (chronic obstructive pulmonary disease) (HCC)    uses in haler as needed   Depression    on meds   Dyspnea    with exertion   Dysrhythmia    GERD (gastroesophageal reflux disease)    Headache    migraines   Herpes    Hypertension    on meds   Liver lesion, right lobe 12/01/2015   CT chest without contrast 11/30/15 with hepatic lesions measuring 2.0 x 1.7 cm of the right lobe and 1 x 1 cm on the dome. CT abd/pel 8/18: 1.3cm inferior R liver lobe, prob benign but recommend 3-71mo f/u with MRI w/wo IV contrast with attenuation MRI 6/19: combination of benign cavernous hemangiomas and small benign cysts   PONV (postoperative nausea and vomiting)    Poor dental hygiene    chipped upper front and several loose teeth   Seasonal allergies    Sickle cell trait (HCC)    Substance abuse (HCC)    cocaine/crack cocaine - last use 02/07/18   SVD (spontaneous vaginal delivery)    x 1   Tobacco abuse 07/08/2015   Uses walker    for ambulation   Uterine leiomyoma 02/16/2016   s/p vaginal hysterectomy 2018   Vaginal discharge 03/26/2018    Assessment: BSW held f/u call with patient. Patient was alert and cognitive. However, she reported being in pain and was grunting while over the phone. Patient states she was given new medication to take at her most recent ED  visit. Patient reports confusion around what medication to take and how to take it along with other medication she is currently taking. BSW will reach out to Pharmacy or PCP to request a call be made to patient to provide clarity on her medications. Patient also reports that her PredniSONE  medication makes her vomit and cannot keep it down. Patient was encouraged to contact PCP and let them know. Patient agreed to call after BSW call. Patient reports she has not received home delivery food box from [employer]. BSW reached out to coordinator Devere Free to confirm if she will be receiving a box of food and when and will f/u with patient. Patient agreed and understood. BSW explained to patient that housing waitlist through [facility] is currently closed unless you are a senior of 60 years of age. BSW provided contact information to [employer] which has waitlist open for studio apartments with rent being $485. Patient was encouraged to call and provided contact info to seek additional information. No other resources were provided/requested at this time.    SDOH Interventions    Flowsheet Row Patient Outreach Telephone from 11/07/2023 in [place] POPULATION HEALTH DEPARTMENT Patient Outreach Telephone from 11/06/2023 in [place] POPULATION HEALTH DEPARTMENT Patient Outreach  Telephone from 10/18/2023 in [place] POPULATION HEALTH DEPARTMENT Patient Outreach Telephone from 10/17/2023 in [place] POPULATION HEALTH DEPARTMENT Office Visit from 07/23/2023 in [facility] [place] Patient Outreach Telephone from 07/19/2023 in [place] POPULATION HEALTH DEPARTMENT  SDOH Interventions        Food Insecurity Interventions -- Community Resources Provided Intervention Not Indicated Intervention Not Indicated -- --  Housing Interventions Community Resources Provided [employer] Provided AMB Referral  [LCSW provided brief education on [place] [employer]  [employer] and [employer] resources in her area that assist with housing. Pt wishes for further resource education. Referral VBCI BSW made to assist further with housing resources] Other (Comment)  [safer enviroment] -- --  Transportation Interventions Payor Benefit Payor Benefit  [[employer] number [phone] wrote down by patient .] Payor Benefit  [[employer] number [phone] wrote down by patient] Other (Comment)  [BSW] -- --  Utilities Interventions -- Intervention Not Indicated Intervention Not Indicated Intervention Not Indicated -- --  Depression Interventions/Treatment  Medication, Community Resources Provided -- Medication, Referral to Psychiatry Medication Medication --  Financial Strain Interventions -- [employer] Provided -- -- -- --  Physical Activity Interventions -- -- -- -- -- Intervention Not Indicated  [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 She reports that she applied for this program recently and was declined because she could put on her clothes without issues] -- -- --    Recommendation:   None  Follow Up Plan:   Telephone follow up appointment date/time:  12/06/2023 at 1:15pm  [name], BSW /VBCI - [facility] Social Worker [phone]

## 2023-12-04 ENCOUNTER — Telehealth: Payer: Self-pay | Admitting: *Deleted

## 2023-12-04 ENCOUNTER — Encounter: Payer: Self-pay | Admitting: *Deleted

## 2023-12-05 ENCOUNTER — Encounter: Payer: Self-pay | Admitting: Licensed Clinical Social Worker

## 2023-12-05 ENCOUNTER — Telehealth: Payer: Self-pay | Admitting: Licensed Clinical Social Worker

## 2023-12-06 ENCOUNTER — Other Ambulatory Visit: Payer: Self-pay

## 2023-12-06 NOTE — Patient Outreach (Signed)
 BSW attempted outreach to patient and was not able to make contact for f/u appointment. No VM available to leavae VM. BSW will try again to reschedule next week.

## 2023-12-08 ENCOUNTER — Inpatient Hospital Stay: Admission: RE | Admit: 2023-12-08 | Source: Ambulatory Visit

## 2023-12-11 ENCOUNTER — Ambulatory Visit: Admitting: Orthopaedic Surgery

## 2023-12-27 ENCOUNTER — Other Ambulatory Visit: Payer: Self-pay | Admitting: Pulmonary Disease

## 2023-12-27 DIAGNOSIS — J449 Chronic obstructive pulmonary disease, unspecified: Secondary | ICD-10-CM

## 2023-12-31 NOTE — Telephone Encounter (Signed)
 Attempted to reach patient to have her come back in for stool studies. No answer, left vm for patient to return call. Orders placed.

## 2023-12-31 NOTE — Addendum Note (Signed)
 Addended by: NICHOLAUS JARVIS on: 12/31/2023 10:00 AM   Modules accepted: Orders

## 2024-01-20 ENCOUNTER — Ambulatory Visit: Payer: Self-pay | Admitting: Student

## 2024-01-20 NOTE — Progress Notes (Deleted)
   Established Patient Office Visit  Subjective   Patient ID: Jean [name], female    DOB: 07-Aug-1965  Age: 58 y.o. MRN: [id number]  No chief complaint on file.   [name] is a 58 y.o. female with past medical history of osteoarthritis, COPD, GERD, hypertension, chronic right knee pain and depression, presents today for uncontrolled hypertension.  Last office visit 06/04/2022  Review of Systems:  As per assessment and Plan   Objective:     There were no vitals filed for this visit. ***  Physical Exam General: Sitting in chair, no acute distress Cardiovascular: Regular rate Pulmonary: Breathing comfortably Abdomen: Soft, nontender, nondistended MSK: No lower extremity edema bilaterally  Last CBC Lab Results  Component Value Date   WBC 6.2 11/26/2023   HGB 13.3 11/26/2023   HCT 41.9 11/26/2023   MCV 99.5 11/26/2023   MCH 31.6 11/26/2023   RDW 14.0 11/26/2023   PLT 311 11/26/2023   Last metabolic panel Lab Results  Component Value Date   GLUCOSE 93 11/26/2023   NA 138 11/26/2023   K 4.2 11/26/2023   CL 110 11/26/2023   CO2 17 (L) 11/26/2023   BUN 16 11/26/2023   CREATININE 1.09 (H) 11/26/2023   GFRNONAA 58 (L) 11/26/2023   CALCIUM  9.5 11/26/2023   PROT 7.2 11/26/2023   ALBUMIN 3.5 11/26/2023   LABGLOB 3.5 02/10/2018   AGRATIO 1.3 02/10/2018   BILITOT 0.6 11/26/2023   ALKPHOS 77 11/26/2023   AST 16 11/26/2023   ALT 12 11/26/2023   ANIONGAP 11 11/26/2023   Last lipids Lab Results  Component Value Date   CHOL 272 (H) 11/29/2022   HDL 146 11/29/2022   LDLCALC 119 (H) 11/29/2022   TRIG 46 11/29/2022   CHOLHDL 1.9 11/29/2022   Last hemoglobin A1c Lab Results  Component Value Date   HGBA1C 5.5 11/29/2022      The ASCVD Risk score (Arnett DK, et al., 2019) failed to calculate for the following reasons:   The valid HDL cholesterol range is 20 to 100 mg/dL    Assessment & Plan:   Patient {GC/GE:3044014::discussed with,seen with}  Dr. {NAMES:[id number]::Chambliss,Chun,Hoffman,Lau,Machen,Narendra,Williams,Winfrey}  Hypertension BP Readings from Last 3 Encounters:  11/27/23 (!) 189/94  07/23/23 124/80  07/17/23 125/74   Patient reports that she is currently taking her medications. Medication consisted of amlodipine  10 mg daily, spironolactone  25 mg daily, metoprolol  tartrate 12.5 mg BID Reports symptoms of  Recent BMP 11/2023 w/ Scr 1.09 and eGFR 58    Chronic right-sided low back pain with right-sided sciatica Follows orthopedics, last seen 11/2023.  She was placed on diclofenac , Neurontin , Flexeril .  Patient was also referred to physical therapy. -Patient advised to follow back with orthopedics.    Problem List Items Addressed This Visit   None   No follow-ups on file.    Toma Edwards, DO

## 2024-01-24 ENCOUNTER — Other Ambulatory Visit: Payer: Self-pay | Admitting: Student

## 2024-01-24 DIAGNOSIS — Z1231 Encounter for screening mammogram for malignant neoplasm of breast: Secondary | ICD-10-CM

## 2024-01-27 ENCOUNTER — Encounter: Payer: Self-pay | Admitting: Physician Assistant

## 2024-01-27 ENCOUNTER — Other Ambulatory Visit (INDEPENDENT_AMBULATORY_CARE_PROVIDER_SITE_OTHER): Payer: Self-pay

## 2024-01-27 ENCOUNTER — Ambulatory Visit (INDEPENDENT_AMBULATORY_CARE_PROVIDER_SITE_OTHER): Admitting: Physician Assistant

## 2024-01-27 DIAGNOSIS — M79674 Pain in right toe(s): Secondary | ICD-10-CM | POA: Diagnosis not present

## 2024-01-27 DIAGNOSIS — M5416 Radiculopathy, lumbar region: Secondary | ICD-10-CM | POA: Diagnosis not present

## 2024-01-27 MED ORDER — GABAPENTIN 300 MG PO CAPS: ORAL_CAPSULE | ORAL | 1 refills | Status: AC

## 2024-01-27 MED ORDER — METHOCARBAMOL 500 MG PO TABS: 1000.0000 mg | ORAL_TABLET | Freq: Three times a day (TID) | ORAL | 0 refills | Status: DC | PRN

## 2024-01-27 NOTE — Addendum Note (Signed)
 Addended by: Avigayil Ton B on: 01/27/2024 11:28 AM   Modules accepted: Orders

## 2024-01-27 NOTE — Progress Notes (Addendum)
 Office Visit Note   Patient: Jean [name]           Date of Birth: 09-Mar-1966           MRN: [id number] Visit Date: 01/27/2024              Requested by: Harrie Bruckner, DO [address], [address] [place],  [place] [address] PCP: Harrie Bruckner, DO   Assessment & Plan: Visit Diagnoses:  1. Great toe pain, right   2. Radiculopathy, lumbar region     Plan:  Will refill her gabapentin  and Robaxin .  Refer her to Dr. Eldonna for lumbar epidural steroid injection for right leg radiculopathy.  Follow-up with us  in 6 weeks to see what type of response she had.  In regards to the right great toe pain recommend Voltaren  gel she ask if she can use her Aspercreme on the toe.  She can try the Aspercreme and see if it is beneficial.  Also would like to see how much of her pain goes away when she has the epidural steroid injection.  Questions were encouraged and answered at length.  Follow-up with us  in 6 weeks see how she is doing.   Follow-Up Instructions: Return in about 6 weeks (around 03/09/2024).   Orders:  Orders Placed This Encounter  Procedures   XR Toe Great Right   Meds ordered this encounter  Medications   methocarbamol  (ROBAXIN ) 500 MG tablet    Sig: Take 2 tablets (1,000 mg total) by mouth every 8 (eight) hours as needed for muscle spasms.    Dispense:  40 tablet    Refill:  0   gabapentin  (NEURONTIN ) 300 MG capsule    Sig: TAKE 1 CAPSULE BY MOUTH EVERYDAY AT BEDTIME    Dispense:  30 capsule    Refill:  1      Procedures: No procedures performed   Clinical Data: No additional findings.   Subjective: Chief Complaint  Patient presents with   Right Leg - Pain    HPI Jean [name] comes in today due to right leg pain that goes into her right great toe.  She was last seen by Dr. Vernetta on 11/13/2023 and at that point time was put on Medrol  Dosepak.  She states she is unable to tolerate this due to nausea and vomiting.  She also was sent to physical  therapy for her back but did not go.  She has had no new injuries.  In the interim she has undergone a MRI of her lumbar spine which shows the most significant moderate spinal stenosis at L4-5.  This has  increased in narrowing since 2021.  She has facet arthritis at L3-4 and L4-5. States that the back pain does awaken her.  She does have some urinary urgency.  No saddle anesthesia or fevers.  She has lost 5 to 6 pounds states that her appetite has been decreased since she has been on iron .  She does have chills.  Review of Systems See HPI  Objective: Vital Signs: LMP  (LMP Unknown)   Physical Exam Constitutional:      Appearance: She is normal weight. She is diaphoretic. She is not ill-appearing.  Cardiovascular:     Pulses: Normal pulses.  Pulmonary:     Effort: Pulmonary effort is normal.  Neurological:     Mental Status: She is alert and oriented to person, place, and time.  Psychiatric:        Mood and Affect: Mood normal.  Ortho Exam Bilateral hips: Excellent range of motion of both hips without pain. Lower extremities: 5 out of 5 strength throughout the lower extremities except for extension of the right great toe against resistance 4 out of 5 with pain.  Straight leg raise is negative bilaterally.  Bilateral feet dorsal pedal pulses are 2+ and equal symmetric.  Right great toe she has tenderness over the MP joint maximally volar aspect and over the sesamoid region.  Nontender left great toe.  Any attempts at range of motion of the right great toe through the MP joint causes significant pain.  There is no abnormal warmth erythema or effusion no impending ulcer side effect.  Imaging: XR Toe Great Right Result Date: 01/27/2024 Right great toe 3 views: No acute fracture.  Moderate narrowing of the MP joint.  Sesamoids well located.  No bony abnormalities otherwise.  Toes well located.    PMFS History: Patient Active Problem List   Diagnosis Date Noted   Hemarthrosis  07/12/2023   Melena 07/12/2023   Occult blood in stools 07/12/2023   Gastritis determined by endoscopy 07/12/2023   Symptomatic anemia 07/11/2023   Status post total right knee replacement 07/02/2023   Osteoarthritis of right knee 06/05/2023   Food insecurity 06/05/2023   Bilateral shoulder pain 11/29/2022   Dysphagia 11/29/2022   History of cardioembolic cerebrovascular accident (CVA) 11/29/2022   Dizziness 06/19/2022   Constipation 05/09/2021   Weight loss 05/09/2021   Headache    Symptomatic bradycardia 04/18/2021   Near syncope 04/18/2021   Synovitis of left knee 07/26/2020   COVID-19 virus infection 05/11/2020   Low back pain 11/24/2019   Impingement syndrome of right shoulder 04/22/2019   History of dental surgery 02/05/2019   COPD GOLD 0 ? 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   GERD (gastroesophageal reflux disease)    Breast pain 06/23/2018   Dyspareunia in female 09/13/2017   Cervicalgia 04/24/2017   Chronic kidney disease, stage 3a (HCC) 10/29/2016   Chronic pain syndrome 05/10/2016   MDD (major depressive disorder) 10/19/2015   Dyspnea on exertion 08/22/2015   HTN (hypertension) 07/08/2015   Routine health maintenance 07/08/2015   Past Medical History:  Diagnosis Date   Adnexal mass 2019   likely remnant fibroid on broad ligament; getting diagnostic lap   Anxiety    on meds   Arthritis    hip-uses OTC meds   Asthma    uses inhaler as needed   Carpal tunnel syndrome    bilateral   Chest wall pain 10/15/2016   Chronic lower back pain    COPD (chronic obstructive pulmonary disease) (HCC)    uses in haler as needed   Depression    on meds   Dyspnea    with exertion   Dysrhythmia    GERD (gastroesophageal reflux disease)    Headache    migraines   Herpes    Hypertension    on meds   Liver lesion, right lobe 12/01/2015   CT chest without contrast 11/30/15 with hepatic lesions measuring 2.0 x 1.7 cm of the  right lobe and 1 x 1 cm on the dome. CT abd/pel 8/18: 1.3cm inferior R liver lobe, prob benign but recommend 3-76mo f/u with MRI w/wo IV contrast with attenuation MRI 6/19: combination of benign cavernous hemangiomas and small benign cysts   PONV (postoperative nausea and vomiting)    Poor dental hygiene    chipped upper front and several  loose teeth   Seasonal allergies    Sickle cell trait (HCC)    Substance abuse (HCC)    cocaine/crack cocaine - last use 02/07/18   SVD (spontaneous vaginal delivery)    x 1   Tobacco abuse 07/08/2015   Uses walker    for ambulation   Uterine leiomyoma 02/16/2016   s/p vaginal hysterectomy 2018   Vaginal discharge 03/26/2018    Family History  Problem Relation Age of Onset   Cancer Mother    Hypertension Mother    Diabetes Mother    Breast cancer Mother        diagnosed in her 47's   Stroke Father    Breast cancer Cousin    Colon cancer Neg Hx    Colon polyps Neg Hx    Esophageal cancer Neg Hx    Rectal cancer Neg Hx    Stomach cancer Neg Hx     Past Surgical History:  Procedure Laterality Date   COLONOSCOPY N/A 07/12/2023   Procedure: COLONOSCOPY;  Surgeon: Shila Gustav GAILS, MD;  Location: [facility] ENDOSCOPY;  Service: Gastroenterology;  Laterality: N/A;   ESOPHAGOGASTRODUODENOSCOPY N/A 07/12/2023   Procedure: EGD (ESOPHAGOGASTRODUODENOSCOPY);  Surgeon: Nandigam, Kavitha V, MD;  Location[facility] ENDOSCOPY;  Service: Gastroenterology;  Laterality: N/A;   LAPAROSCOPIC OVARIAN CYSTECTOMY Right 02/25/2018   Procedure: LAPAROSCOPIC REMOVAL OF RIGHT ADNEXAL MASS;  Surgeon: Lorence Ozell CROME, MD;  Location: [facility] ORS;  Service: Gynecology;  Laterality: Right;   SALPINGECTOMY Left    lap - ectopic preg   TOTAL HIP ARTHROPLASTY Left 01/20/2016   Procedure: LEFT TOTAL HIP ARTHROPLASTY ANTERIOR APPROACH;  Surgeon: Lonni CINDERELLA Poli, MD;  Location: [facility] ORS;  Service: Orthopedics;  Laterality: Left;   TOTAL KNEE ARTHROPLASTY Right 07/02/2023   Procedure: RIGHT  TOTAL KNEE ARTHROPLASTY;  Surgeon: Poli Lonni CINDERELLA, MD;  Location: [facility] OR;  Service: Orthopedics;  Laterality: Right;   TUBAL LIGATION     VAGINAL HYSTERECTOMY Right 12/11/2016   Procedure: HYSTERECTOMY VAGINAL WITH RIGHT SALPINGECTOMY;  Surgeon: Lorence Ozell CROME, MD;  Location: [facility] ORS;  Service: Gynecology;  Laterality: Right;   Social History   Occupational History   Not on file  Tobacco Use   Smoking status: Former    Average packs/day: 0.5 packs/day for 35.5 years (17.7 ttl pk-yrs)    Types: Cigarettes    Start date: 05/22/1983   Smokeless tobacco: Never  Vaping Use   Vaping status: Former  Substance and Sexual Activity   Alcohol  use: Yes    Alcohol /week: 3.0 standard drinks of alcohol     Types: 3 Cans of beer per week    Comment: socially   Drug use: No    Types: Crack cocaine, Cocaine    Comment: last 6 months ago.   Sexual activity: Yes    Partners: Male    Birth control/protection: None, Post-menopausal    Comment: Hysterectomy

## 2024-02-03 ENCOUNTER — Telehealth: Payer: Self-pay

## 2024-02-03 ENCOUNTER — Other Ambulatory Visit: Payer: Self-pay | Admitting: Physical Medicine and Rehabilitation

## 2024-02-03 DIAGNOSIS — F411 Generalized anxiety disorder: Secondary | ICD-10-CM

## 2024-02-03 MED ORDER — DIAZEPAM 5 MG PO TABS: ORAL_TABLET | ORAL | 0 refills | Status: AC

## 2024-02-03 NOTE — Telephone Encounter (Signed)
 Patient advising she want pre procedural valium

## 2024-02-05 ENCOUNTER — Ambulatory Visit

## 2024-02-06 ENCOUNTER — Telehealth: Payer: Self-pay | Admitting: *Deleted

## 2024-02-07 ENCOUNTER — Encounter: Payer: Self-pay | Admitting: *Deleted

## 2024-02-07 ENCOUNTER — Telehealth: Payer: Self-pay | Admitting: *Deleted

## 2024-02-07 NOTE — Patient Instructions (Signed)
 Merle JONETTA Croak - I have attempted to call you three times but have been unsuccessful in reaching you. I work with Harrie Bruckner, DO and am calling to support your healthcare needs. If I can be of assistance to you, please contact me at [phone].     Thank you,   Olam Ku, RN, BSN [place]  [facility], [facility] RN Care Manager Direct Dial: [phone]  Fax: [phone]

## 2024-02-12 ENCOUNTER — Other Ambulatory Visit: Payer: Self-pay | Admitting: Orthopaedic Surgery

## 2024-02-12 DIAGNOSIS — M62838 Other muscle spasm: Secondary | ICD-10-CM

## 2024-02-12 DIAGNOSIS — M25559 Pain in unspecified hip: Secondary | ICD-10-CM

## 2024-02-25 ENCOUNTER — Encounter: Admitting: Physical Medicine and Rehabilitation

## 2024-03-24 ENCOUNTER — Ambulatory Visit: Admitting: Physical Medicine and Rehabilitation

## 2024-03-24 ENCOUNTER — Other Ambulatory Visit: Payer: Self-pay

## 2024-03-24 VITALS — BP 160/98 | HR 80

## 2024-03-24 DIAGNOSIS — M5416 Radiculopathy, lumbar region: Secondary | ICD-10-CM

## 2024-03-24 MED ORDER — METHYLPREDNISOLONE ACETATE 40 MG/ML IJ SUSP
40.0000 mg | Freq: Once | INTRAMUSCULAR | Status: AC
  Administered 2024-03-24: 40 mg

## 2024-03-24 NOTE — Procedures (Signed)
 Lumbar Epidural Steroid Injection - Interlaminar Approach with Fluoroscopic Guidance  Patient: Jean [name]      Date of Birth: [birth year] MRN: [id number] PCP: Harrie Bruckner, DO      Visit Date: 03/24/2024   Universal Protocol:     Consent Given By: the patient  Position: PRONE  Additional Comments: Vital signs were monitored before and after the procedure. Patient was prepped and draped in the usual sterile fashion. The correct patient, procedure, and site was verified.   Injection Procedure Details:   Procedure diagnoses: Lumbar radiculopathy [M54.16]   Meds Administered:  Meds ordered this encounter  Medications   methylPREDNISolone  acetate (DEPO-MEDROL ) injection 40 mg     Laterality: Right  Location/Site:  L4-5  Needle: 3.5 in., 20 ga. Tuohy  Needle Placement: Paramedian epidural  Findings:   -Comments: Excellent flow of contrast into the epidural space.  Procedure Details: Using a paramedian approach from the side mentioned above, the region overlying the inferior lamina was localized under fluoroscopic visualization and the soft tissues overlying this structure were infiltrated with 4 ml. of 1% Lidocaine  without Epinephrine . The Tuohy needle was inserted into the epidural space using a paramedian approach.   The epidural space was localized using loss of resistance along with counter oblique bi-planar fluoroscopic views.  After negative aspirate for air, blood, and CSF, a 2 ml. volume of Isovue -250 was injected into the epidural space and the flow of contrast was observed. Radiographs were obtained for documentation purposes.    The injectate was administered into the level noted above.   Additional Comments:  The patient tolerated the procedure well Dressing: 2 x 2 sterile gauze and Band-Aid    Post-procedure details: Patient was observed during the procedure. Post-procedure instructions were reviewed.  Patient left the clinic in stable  condition.

## 2024-03-24 NOTE — Progress Notes (Signed)
 Jean [name] - 58 y.o. female MRN [id number]  Date of birth: [birth year]  Office Visit Note: Visit Date: 03/24/2024 PCP: Harrie Bruckner, DO Referred by: Harrie Bruckner, DO  Subjective: Chief Complaint  Patient presents with   Lower Back - Pain   HPI:  Jean [name] is a 58 y.o. female who comes in today at the request of Jean Gaskins, PA-C for planned Right L4-5 Lumbar Interlaminar epidural steroid injection with fluoroscopic guidance.  The patient has failed conservative care including home exercise, medications, time and activity modification.  This injection will be diagnostic and hopefully therapeutic.  Please see requesting physician notes for further details and justification.   ROS Otherwise per HPI.  Assessment & Plan: Visit Diagnoses:    ICD-10-CM   1. Lumbar radiculopathy  M54.16 XR C-ARM NO REPORT    Epidural Steroid injection    methylPREDNISolone  acetate (DEPO-MEDROL ) injection 40 mg      Plan: No additional findings.   Meds & Orders:  Meds ordered this encounter  Medications   methylPREDNISolone  acetate (DEPO-MEDROL ) injection 40 mg    Orders Placed This Encounter  Procedures   XR C-ARM NO REPORT   Epidural Steroid injection    Follow-up: Return for visit to requesting provider as needed.   Procedures: No procedures performed  Lumbar Epidural Steroid Injection - Interlaminar Approach with Fluoroscopic Guidance  Patient: Jean [name]      Date of Birth: [birth year] MRN: [id number] PCP: Harrie Bruckner, DO      Visit Date: 03/24/2024   Universal Protocol:     Consent Given By: the patient  Position: PRONE  Additional Comments: Vital signs were monitored before and after the procedure. Patient was prepped and draped in the usual sterile fashion. The correct patient, procedure, and site was verified.   Injection Procedure Details:   Procedure diagnoses: Lumbar radiculopathy [M54.16]   Meds Administered:  Meds ordered this  encounter  Medications   methylPREDNISolone  acetate (DEPO-MEDROL ) injection 40 mg     Laterality: Right  Location/Site:  L4-5  Needle: 3.5 in., 20 ga. Tuohy  Needle Placement: Paramedian epidural  Findings:   -Comments: Excellent flow of contrast into the epidural space.  Procedure Details: Using a paramedian approach from the side mentioned above, the region overlying the inferior lamina was localized under fluoroscopic visualization and the soft tissues overlying this structure were infiltrated with 4 ml. of 1% Lidocaine  without Epinephrine . The Tuohy needle was inserted into the epidural space using a paramedian approach.   The epidural space was localized using loss of resistance along with counter oblique bi-planar fluoroscopic views.  After negative aspirate for air, blood, and CSF, a 2 ml. volume of Isovue -250 was injected into the epidural space and the flow of contrast was observed. Radiographs were obtained for documentation purposes.    The injectate was administered into the level noted above.   Additional Comments:  The patient tolerated the procedure well Dressing: 2 x 2 sterile gauze and Band-Aid    Post-procedure details: Patient was observed during the procedure. Post-procedure instructions were reviewed.  Patient left the clinic in stable condition.   Clinical History: CLINICAL DATA:  Lower back pain, right lower back pain for 3 weeks.   EXAM: MRI LUMBAR SPINE WITHOUT CONTRAST   TECHNIQUE: Multiplanar, multisequence MR imaging of the lumbar spine was performed. No intravenous contrast was administered.   COMPARISON:  Lumbar spine radiograph 11/13/2023. MRI lumbar spine 10/23/2019.   FINDINGS: Segmentation: There are presumed to be 5  non-rib-bearing lumbar type vertebral bodies. Based on this numbering there is transitional anatomy at the lumbosacral junction with a partially lumbarized S1 vertebra.   Alignment: Lumbar lordosis is maintained.  Grade 1 anterolisthesis of L5 on S1.   Vertebrae: Vertebral body heights are maintained. No bone marrow edema or evidence of fracture. Mildly heterogeneous bone marrow signal intensity. No evidence of suspicious osseous lesion.   Conus medullaris and cauda equina: Conus extends to the upper L2 level. Conus and cauda equina appear normal.   Paraspinal and other soft tissues: Cystic foci projecting into the posterior paraspinal musculature adjacent to the left facets at L4-5 suggestive of synovial cysts which measure up to 0.7 cm in diameter. Similar appearance of bilateral facet edema at L4-5 slightly more pronounced on the left.   Disc levels:   T12-L1: No significant disc bulge. No significant spinal canal stenosis or foraminal stenosis.   L1-2: No significant disc bulge. No significant spinal canal stenosis or foraminal stenosis.   L2-3: No significant disc bulge. No significant spinal canal stenosis or foraminal stenosis. Mild facet arthrosis.   L3-4: Small disc bulge. Mild-to-moderate bilateral facet arthrosis with trace facet effusions. Mild ligamentum flavum hypertrophy. No significant spinal canal or foraminal stenosis.   L4-5: Diffuse disc bulge resulting in mild lateral recess narrowing. Small left foraminal disc protrusion. Moderate facet arthrosis and thickening of the ligamentum flavum which indents the dorsal thecal sac. There is moderate spinal canal stenosis which is increased since 2021. Mild bilateral foraminal stenosis.   L5-S1: Disc desiccation and mild disc height loss. Diffuse disc bulge resulting in mild lateral recess narrowing. Moderate to severe facet arthrosis which indents the dorsal thecal sac. No significant spinal canal or foraminal stenosis.   IMPRESSION: Degenerative changes of the lumbar spine as above. Disc bulge and facet arthrosis at L4-5 resulting in moderate spinal canal stenosis which is increased since 2021.   Facet arthrosis  throughout the lumbar spine with facet effusions noted at L3-4 and L4-5.   Synovial cysts adjacent to the left facets at L4-5 projecting into the paraspinal musculature without contribution to spinal canal narrowing.   Transitional lumbosacral anatomy.     Electronically Signed   By: Donnice Mania M.D.   On: 11/26/2023 19:11     Objective:  VS:  HT:    WT:   BMI:     BP:(!) 160/98  HR:80bpm  TEMP: ( )  RESP:  Physical Exam Vitals and nursing note reviewed.  Constitutional:      General: She is not in acute distress.    Appearance: Normal appearance. She is well-developed. She is not ill-appearing.  HENT:     Head: Normocephalic and atraumatic.     Right Ear: External ear normal.     Left Ear: External ear normal.  Eyes:     Extraocular Movements: Extraocular movements intact.     Conjunctiva/sclera: Conjunctivae normal.     Pupils: Pupils are equal, round, and reactive to light.  Cardiovascular:     Rate and Rhythm: Normal rate.     Pulses: Normal pulses.  Pulmonary:     Effort: Pulmonary effort is normal. No respiratory distress.  Abdominal:     General: There is no distension.     Palpations: Abdomen is soft.  Musculoskeletal:        General: Tenderness present.     Cervical back: Neck supple.     Right lower leg: No edema.     Left lower leg: No edema.  Comments: Patient has good distal strength with no pain over the greater trochanters.  No clonus or focal weakness.  Skin:    General: Skin is warm and dry.     Findings: No erythema, lesion or rash.  Neurological:     General: No focal deficit present.     Mental Status: She is alert and oriented to person, place, and time.     Cranial Nerves: No cranial nerve deficit.     Sensory: No sensory deficit.     Motor: No weakness or abnormal muscle tone.     Coordination: Coordination normal.     Gait: Gait abnormal.  Psychiatric:        Mood and Affect: Mood normal.        Behavior: Behavior normal.       Imaging: No results found.

## 2024-03-24 NOTE — Progress Notes (Signed)
 Pain Scale   Average Pain 8 Patient advising she has chronic lower back pain radiating to right leg. Patient advising her pain is chronic.        +Driver, -BT, -Dye Allergies.

## 2024-03-26 ENCOUNTER — Other Ambulatory Visit: Payer: Self-pay | Admitting: Physical Medicine and Rehabilitation

## 2024-03-26 ENCOUNTER — Other Ambulatory Visit: Payer: Self-pay | Admitting: Physician Assistant

## 2024-04-05 ENCOUNTER — Other Ambulatory Visit: Payer: Self-pay | Admitting: Gastroenterology

## 2024-04-05 DIAGNOSIS — A048 Other specified bacterial intestinal infections: Secondary | ICD-10-CM

## 2024-04-06 ENCOUNTER — Encounter: Payer: Self-pay | Admitting: Physician Assistant

## 2024-04-06 ENCOUNTER — Ambulatory Visit: Admitting: Physician Assistant

## 2024-04-06 DIAGNOSIS — M7051 Other bursitis of knee, right knee: Secondary | ICD-10-CM | POA: Diagnosis not present

## 2024-04-06 MED ORDER — METHYLPREDNISOLONE ACETATE 40 MG/ML IJ SUSP
20.0000 mg | INTRAMUSCULAR | Status: AC | PRN
  Administered 2024-04-06: 20 mg via INTRAMUSCULAR

## 2024-04-06 MED ORDER — LIDOCAINE HCL 1 % IJ SOLN
0.5000 mL | INTRAMUSCULAR | Status: AC | PRN
  Administered 2024-04-06: .5 mL

## 2024-04-06 NOTE — Progress Notes (Signed)
 HPI: Jean [name] returns today status post epidural steroid injection with Dr. Eldonna.  This was a lumbar epidural steroid injection right laminar approach at L4-5 for right lower extremity radiculopathy.  She states she got no relief with this.  She is complaining mostly of right knee pain that radiates from the knee medially down the great toe.  She has a history of right total knee arthroplasty.  She has had no falls or injuries.  Review of systems: Denies any fevers or chills.  Physical exam: General well-developed well-nourished female no acute distress. Right knee good range of motion without pain.  No instability valgus varus stressing.  No abnormal warmth erythema.  Tenderness over the pes anserinus region. Right lower extremity negative straight leg raise.  Impression: Right knee pes anserinus bursitis  Plan: Given the patient's continued pain down the right leg there is still some question if this is lumbar radiculopathy with a pes anserinus bursitis.  For both diagnostic and therapeutic purposes offered her pes anserinus injection would like to see her back in 2 weeks after the injection and see how she is doing overall.  Questions were encouraged and answered at length today.     Procedure Note  Patient: Jean [name]             Date of Birth: [birth year]           MRN: [id number]             Visit Date: 04/06/2024  Procedures: Visit Diagnoses:  1. Pes anserinus bursitis of right knee     Trigger Point Inj  Date/Time: 04/06/2024 5:08 PM  Performed by: Gretta Bertrum ORN, PA-C Authorized by: Gretta Bertrum ORN, PA-C   Indications:  Pain Total # of Trigger Points:  1 Location: lower extremity   Approach:  Medial Medications #1:  0.5 mL lidocaine  1 %; 20 mg methylPREDNISolone  acetate 40 MG/ML

## 2024-04-20 ENCOUNTER — Ambulatory Visit: Admitting: Physician Assistant

## 2024-04-22 ENCOUNTER — Emergency Department (HOSPITAL_COMMUNITY)
Admission: EM | Admit: 2024-04-22 | Discharge: 2024-04-23 | Disposition: A | Discharge status: Home / Self Care | Source: Home / Self Care | Attending: Emergency Medicine | Admitting: Emergency Medicine

## 2024-04-22 ENCOUNTER — Other Ambulatory Visit: Payer: Self-pay

## 2024-04-22 ENCOUNTER — Encounter (HOSPITAL_COMMUNITY): Payer: Self-pay

## 2024-04-22 DIAGNOSIS — J101 Influenza due to other identified influenza virus with other respiratory manifestations: Secondary | ICD-10-CM | POA: Insufficient documentation

## 2024-04-22 DIAGNOSIS — R509 Fever, unspecified: Secondary | ICD-10-CM | POA: Diagnosis present

## 2024-04-22 DIAGNOSIS — Z7982 Long term (current) use of aspirin: Secondary | ICD-10-CM | POA: Diagnosis not present

## 2024-04-22 MED ORDER — ACETAMINOPHEN 500 MG PO TABS
1000.0000 mg | ORAL_TABLET | Freq: Once | ORAL | Status: AC
  Administered 2024-04-22: 1000 mg via ORAL
  Filled 2024-04-22: qty 2

## 2024-04-22 NOTE — ED Triage Notes (Signed)
 Pt BIB [facility] from home. Cough x3 days with green phlegm. Pt called EMS today bc pt felt like her HR was irregular. Pt also reports fever at home, reports highest of 102T. EMS reports NSR and 96% RA. Pt used inhaler prior to EMS arrival. Hx COPD, asthma   EMS 144/72BP 90P 100% duoneb 136 cbg CAP 40 18LAC 125mg  solumedrol 1 duoneb

## 2024-04-23 LAB — RESP PANEL BY RT-PCR (RSV, FLU A&B, COVID)  RVPGX2
Influenza A by PCR: POSITIVE — AB
Influenza B by PCR: NEGATIVE
Resp Syncytial Virus by PCR: NEGATIVE
SARS Coronavirus 2 by RT PCR: NEGATIVE

## 2024-04-23 MED ORDER — KETOROLAC TROMETHAMINE 60 MG/2ML IM SOLN: 30.0000 mg | Freq: Once | INTRAMUSCULAR | Status: DC

## 2024-04-23 MED ORDER — NAPROXEN 500 MG PO TABS: 500.0000 mg | ORAL_TABLET | Freq: Two times a day (BID) | ORAL | 0 refills | Status: AC

## 2024-04-23 MED ADMIN — Ketorolac Tromethamine Inj 15 MG/ML: 15 mg | INTRAVENOUS | @ 01:00:00 | NDC 72266023401

## 2024-04-23 MED FILL — Ketorolac Tromethamine Inj 15 MG/ML: 15.0000 mg | INTRAMUSCULAR | Qty: 1 | Status: AC

## 2024-04-23 NOTE — ED Notes (Signed)
Pt given juice and crackers and tolerated well.

## 2024-04-23 NOTE — ED Provider Notes (Signed)
 [place] EMERGENCY DEPARTMENT AT [facility] Provider Note   CSN: [id number] Arrival date & time: 04/22/24  2310     Patient presents with: Shortness of Breath   Jean [name] is a 58 y.o. female.   The history is provided by the patient.  URI Presenting symptoms: congestion, cough, fever and sore throat   Severity:  Moderate Onset quality:  Gradual Duration:  3 days Timing:  Constant Progression:  Worsening Chronicity:  New Relieved by:  Nothing Worsened by:  Nothing Ineffective treatments:  None tried Associated symptoms: myalgias   Associated symptoms: no wheezing   Associated symptoms comment:  Severe and pain with coughing Risk factors: not elderly, no immunosuppression, no recent illness and no recent travel   Risk factors comment:  No leg pain no travel no pleuritic pain      Prior to Admission medications  Medication Sig Start Date End Date Taking? Authorizing Provider  naproxen  (NAPROSYN ) 500 MG tablet Take 1 tablet (500 mg total) by mouth 2 (two) times daily with a meal. 04/23/24  Yes Lakenzie Mcclafferty, MD  acetaminophen  (TYLENOL ) 500 MG tablet Take 1,000 mg by mouth every 6 (six) hours as needed for moderate pain (pain score 4-6).    [provider]  aspirin  81 MG chewable tablet Chew 1 tablet (81 mg total) by mouth 2 (two) times daily. 07/04/23   Vernetta Lonni GRADE, MD  atorvastatin  (LIPITOR) 40 MG tablet Take 1 tablet (40 mg total) by mouth daily. 11/21/22 04/06/24  Lucien Orren SAILOR, PA-C  diazepam  (VALIUM ) 5 MG tablet Take one tablet by mouth with light food one hour prior to procedure. 02/03/24   Williams, Megan E, NP  diclofenac  (VOLTAREN ) 75 MG EC tablet TAKE 1 TABLET BY MOUTH 2 TIMES DAILY AS NEEDED. 09/11/23   Vernetta Lonni GRADE, MD  ferrous sulfate  325 (65 FE) MG tablet Take 1 tablet (325 mg total) by mouth every other day. 11/04/23   Harrie Lonni, DO  gabapentin  (NEURONTIN ) 300 MG capsule TAKE 1 CAPSULE BY MOUTH EVERYDAY  AT BEDTIME 01/27/24   Gretta Bertrum ORN, PA-C  HYDROcodone -acetaminophen  (NORCO) 7.5-325 MG tablet Take 1 tablet by mouth every 6 (six) hours as needed for moderate pain (pain score 4-6). 09/09/23   Vernetta Lonni GRADE, MD  methocarbamol  (ROBAXIN ) 500 MG tablet TAKE 2 TABLETS (1,000 MG TOTAL) BY MOUTH EVERY 8 (EIGHT) HOURS AS NEEDED FOR MUSCLE SPASMS. 03/27/24   Gretta Bertrum ORN, PA-C  omeprazole  (PRILOSEC) 20 MG capsule Take 1 capsule (20 mg total) by mouth in the morning and at bedtime for 10 days. 11/04/23 04/06/24  Nandigam, Kavitha V, MD  pantoprazole  (PROTONIX ) 40 MG tablet Take 1 tablet (40 mg total) by mouth daily. 07/23/23   Harrie Lonni, DO  sertraline  (ZOLOFT ) 25 MG tablet Take 1 tablet (25 mg total) by mouth daily. 09/09/23 04/06/24  Norrine Sharper, MD  spironolactone  (ALDACTONE ) 25 MG tablet TAKE 1 TABLET (25 MG TOTAL) BY MOUTH DAILY. 07/17/23   Nahser, Aleene PARAS, MD  VENTOLIN  HFA 108 (90 Base) MCG/ACT inhaler INHALE 2 PUFFS INTO THE LUNGS EVERY 4 HOURS AS NEEDED FOR WHEEZING OR SHORTNESS OF BREATH. 12/27/23   Hunsucker, Donnice SAUNDERS, MD    Allergies: Hydrocodone  and Lisinopril    Review of Systems  Constitutional:  Positive for fever.  HENT:  Positive for congestion and sore throat.   Respiratory:  Positive for cough. Negative for wheezing and stridor.   Cardiovascular:  Negative for chest pain, palpitations and leg swelling.  Gastrointestinal:  Negative for diarrhea, nausea and vomiting.  Musculoskeletal:  Positive for myalgias.  All other systems reviewed and are negative.   Updated Vital Signs BP 116/70   Pulse 78   Temp (!) 100.6 F (38.1 C) (Oral)   Resp 17   Ht 5' 5 (1.651 m)   Wt 61.7 kg   LMP  (LMP Unknown)   SpO2 99%   BMI 22.63 kg/m   Physical Exam Vitals and nursing note reviewed.  Constitutional:      General: She is not in acute distress.    Appearance: Normal appearance. She is well-developed.  HENT:     Head: Normocephalic and atraumatic.      Nose: Nose normal.     Mouth/Throat:     Mouth: Mucous membranes are moist.  Eyes:     Pupils: Pupils are equal, round, and reactive to light.  Cardiovascular:     Rate and Rhythm: Normal rate and regular rhythm.     Pulses: Normal pulses.     Heart sounds: Normal heart sounds.  Pulmonary:     Effort: Pulmonary effort is normal. No respiratory distress.     Breath sounds: Normal breath sounds. No stridor. No wheezing, rhonchi or rales.  Chest:     Chest wall: No tenderness.  Abdominal:     General: Bowel sounds are normal. There is no distension.     Palpations: Abdomen is soft.     Tenderness: There is no abdominal tenderness. There is no guarding or rebound.  Musculoskeletal:        General: Normal range of motion.     Cervical back: Neck supple.  Skin:    General: Skin is dry.     Capillary Refill: Capillary refill takes less than 2 seconds.     Findings: No erythema or rash.  Neurological:     General: No focal deficit present.     Mental Status: She is alert.     Deep Tendon Reflexes: Reflexes normal.  Psychiatric:        Mood and Affect: Mood normal.     (all labs ordered are listed, but only abnormal results are displayed) Labs Reviewed  RESP PANEL BY RT-PCR (RSV, FLU A&B, COVID)  RVPGX2 - Abnormal; Notable for the following components:      Result Value   Influenza A by PCR POSITIVE (*)    All other components within normal limits    EKG: EKG Interpretation Date/Time:  Thursday April 23 2024 01:56:45 EST Ventricular Rate:  77 PR Interval:  144 QRS Duration:  89 QT Interval:  401 QTC Calculation: 454 R Axis:   -17  Text Interpretation: Sinus rhythm Probable left atrial enlargement Confirmed by Nettie, Darrelyn Morro ([id number]) on 04/23/2024 2:00:08 AM  Radiology: No results found.   Procedures   Medications Ordered in the ED  acetaminophen  (TYLENOL ) tablet 1,000 mg (1,000 mg Oral Given 04/22/24 2351)  ketorolac  (TORADOL ) 15 MG/ML injection 15 mg (15 mg  Intravenous Given 04/23/24 0116)                                    Medical Decision Making Patient with flu like symptoms for 3 days   Amount and/or Complexity of Data Reviewed External Data Reviewed: notes.    Details: Previous notes reviewed  Labs: ordered.    Details: Flu A is positive  ECG/medicine tests: ordered and independent interpretation performed. Decision-making details  documented in ED Course.  Risk OTC drugs. Prescription drug management. Risk Details: Very well appearing, lungs are clear.  Oxygen is 100% on room air.  No signs of sepsis.  No indication for labs at this time.  Alternate tylenol  and ibuprofen , drink copious fluids.  Outside the window for Tamiflu  Stable for discharge.  Strict returns      Final diagnoses:  Influenza A   Return for intractable cough, coughing up blood, fevers > 100.4 unrelieved by medication, shortness of breath, intractable vomiting, chest pain, shortness of breath, weakness, numbness, changes in speech, facial asymmetry, abdominal pain, passing out, Inability to tolerate liquids or food, cough, altered mental status or any concerns. No signs of systemic illness or infection. The patient is nontoxic-appearing on exam and vital signs are within normal limits.  I have reviewed the triage vital signs and the nursing notes. Pertinent labs & imaging results that were available during my care of the patient were reviewed by me and considered in my medical decision making (see chart for details). After history, exam, and medical workup I feel the patient has been appropriately medically screened and is safe for discharge home. Pertinent diagnoses were discussed with the patient. Patient was given return precautions.        ED Discharge Orders          Ordered    naproxen  (NAPROSYN ) 500 MG tablet  2 times daily with meals        04/23/24 0209               Ikeya Brockel, MD 04/23/24 [id number]

## 2024-04-28 ENCOUNTER — Ambulatory Visit: Admitting: Physician Assistant

## 2024-05-14 ENCOUNTER — Other Ambulatory Visit: Payer: Self-pay | Admitting: Student

## 2024-05-14 ENCOUNTER — Telehealth: Payer: Self-pay | Admitting: *Deleted

## 2024-05-14 ENCOUNTER — Ambulatory Visit: Payer: Self-pay

## 2024-05-14 DIAGNOSIS — R002 Palpitations: Secondary | ICD-10-CM

## 2024-05-14 DIAGNOSIS — Z79899 Other long term (current) drug therapy: Secondary | ICD-10-CM

## 2024-05-14 DIAGNOSIS — I10 Essential (primary) hypertension: Secondary | ICD-10-CM

## 2024-05-14 DIAGNOSIS — F321 Major depressive disorder, single episode, moderate: Secondary | ICD-10-CM

## 2024-05-14 NOTE — Telephone Encounter (Signed)
 Call #2- Reached out to patient again at [phone] , LVM and instructed to call back at [phone]   Reason for Triage: frequent urination, pain on left side of stomach lower half, hurts when coughing, no fever, light headed due no bp medication. Wanting to schedule appt also for medication management.

## 2024-05-14 NOTE — Progress Notes (Addendum)
 Complex Care Management Note  Care Guide Note 05/14/2024 Name: Jean [name] MRN: [id number] DOB: July 24, 1965  Jean [name] is a 59 y.o. year old female who sees Harrie Bruckner, DO for primary care. I reached out to Jean [name] by phone today to offer complex care management services.  Jean [name] was given information about Complex Care Management services today including:   The Complex Care Management services include support from the care team which includes your Nurse Care Manager, Clinical Social Worker, or Pharmacist.  The Complex Care Management team is here to help remove barriers to the health concerns and goals most important to you. Complex Care Management services are voluntary, and the patient may decline or stop services at any time by request to their care team member.   Complex Care Management Consent Status: Patient agreed to services and verbal consent obtained.   Follow up plan:  Telephone appointment with complex care management team member scheduled for:  05/15/24   Patient reported feeling depressed but explicitly denied any intent or plan to harm herself.    Provided Mental Health Crisis Line and Behavioral Health Crisis Line contact numbers. Advised patient to call 911 in case of emergency or if suicidal thoughts arise.  Patient given the following: Please call the Suicide and Crisis Lifeline: 988 go to [facility] [address], [place] [phone]) if you are experiencing a Mental Health or Behavioral Health Crisis or need someone to talk to.    Patient acknowledged receipt of resources and verbalized understanding.  Encounter Outcome:  Patient Scheduled  Harlene Satterfield  [facility]  [facility], [facility] Guide  Direct Dial: [phone]  Fax [phone]

## 2024-05-14 NOTE — Telephone Encounter (Signed)
 LOV 07/2023 - called pt, no answer; left message on vm she needs to call the office and schedule an appt.

## 2024-05-14 NOTE — Telephone Encounter (Signed)
 Called pt - no answer; left message of office's return call.

## 2024-05-14 NOTE — Telephone Encounter (Signed)
 Copied from CRM [id number]. Topic: Clinical - Medication Refill >> May 14, 2024  8:19 AM Farrel B wrote: Medication: amLODipine  (NORVASC ) 10 MG tablet spironolactone  (ALDACTONE ) 25 MG tablet   Has the patient contacted their pharmacy? No pt states she has not been to doctor and experiencing the hbp, doesn't remember all the other medications that may need to be restarted   This is the patient's preferred pharmacy:  [id number] [address] [place], [place] - [address] [place] [address] Phone: [phone] Fax: [phone]    Is this the correct pharmacy for this prescription? Yes If no, delete pharmacy and type the correct one.   Has the prescription been filled recently? No  Is the patient out of the medication? Yes  Has the patient been seen for an appointment in the last year OR does the patient have an upcoming appointment? No  Can we respond through MyChart? No  Agent: Please be advised that Rx refills may take up to 3 business days. We ask that you follow-up with your pharmacy.

## 2024-05-14 NOTE — Telephone Encounter (Signed)
 amLODipine  (NORVASC ) 10 MG tablet -- not on active med list-  No pt states she has not been to doctor and experiencing the hbp, doesn't remember all the other medications that may need to be restarted

## 2024-05-14 NOTE — Telephone Encounter (Signed)
 3rd attempt to reach patient; Called [phone] with no answer-unable to leave VM  Reason for Triage: frequent urination, pain on left side of stomach lower half, hurts when coughing, no fever, light headed due no bp medication. Wanting to schedule appt also for medication management.

## 2024-05-14 NOTE — Telephone Encounter (Signed)
#  1- Attempted to reach patient at [phone], busy signal. Then attempted to reach patient at [phone] , no answer, LVM.

## 2024-05-15 ENCOUNTER — Other Ambulatory Visit: Payer: Self-pay

## 2024-05-15 DIAGNOSIS — Z789 Other specified health status: Secondary | ICD-10-CM

## 2024-05-15 MED ORDER — SPIRONOLACTONE 25 MG PO TABS: 25.0000 mg | ORAL_TABLET | Freq: Every day | ORAL | 1 refills | Status: AC
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
## 2024-05-15 NOTE — Patient Instructions (Signed)
 Visit Information  Thank you for taking time to visit with me today. Please don't hesitate to contact me if I can be of assistance to you before our next scheduled appointment.  Your next appointment is by telephone on 05/26/24 at 1:00 PM. Please call the care guide team at [phone] if you need to cancel or reschedule your appointment.   Following is a copy of your care plan:   Goals Addressed             This Visit's Progress    VBCI Social Work Care Plan LCSW       Problems:   Lacks knowledge of how to connect  and Financial Strain  and Housing   CSW Clinical Goal(s):   Over the next 90 days the Patient will explore community resource options for unmet needs related to [employer]  and Housing  as evidenced by  her report to the care management team member at follow up phone call.   Interventions:  Level of Care Concerns in a patient with Housing Instability Current level of care: home, alone Evaluation of patient's unmet needs in current living environment ADL's Assessed needs, level of care concerns, how currently meeting needs and barriers to care Discuss community support options (for housing and financial resources )   Patient Goals/Self-Care Activities:  Collaborate with the community resource care guide for assistance with housing and financial resources.  Plan:   Telephone follow up appointment with BSW scheduled for:  1/20 at 1:00 PM.        Please call the Suicide and Crisis Lifeline: 988 call the [place]  National Suicide Prevention Lifeline: 405-032-7593 or TTY: 873-262-7098 TTY (667)064-5813) to talk to a trained counselor call 1-800-273-TALK (toll free, 24 hour hotline) go to [facility] [address], [place] [phone]) call 911 if you are experiencing a Mental Health or Behavioral Health Crisis or need someone to talk to.  Patient verbalized understanding of Care plan and visit instructions  communicated this visit.  Murray Shawl, LCSW [facility], [facility] Licensed Clinical Social Worker Direct Dial : [phone]

## 2024-05-25 ENCOUNTER — Ambulatory Visit: Admitting: Physician Assistant

## 2024-05-26 ENCOUNTER — Encounter: Payer: Self-pay | Admitting: Licensed Clinical Social Worker

## 2024-05-26 ENCOUNTER — Telehealth: Payer: Self-pay | Admitting: Licensed Clinical Social Worker

## 2024-06-01 ENCOUNTER — Telehealth: Payer: Self-pay

## 2024-06-01 NOTE — Progress Notes (Signed)
 Complex Care Management Note Care Guide Note  06/01/2024 Name: Jean [name] MRN: [id number] DOB: 04/04/1966   Complex Care Management Outreach Attempts: An unsuccessful telephone outreach was attempted today to offer the patient information about available complex care management services.  Follow Up Plan:  Additional outreach attempts will be made to offer the patient complex care management information and services.   Encounter Outcome:  No Answer  Dreama [facility]wood [facility]  [facility], [facility] VBCI Assistant Direct Dial: [phone]  Fax: [phone]

## 2024-06-01 NOTE — Progress Notes (Signed)
 Complex Care Management Care Guide Note  06/01/2024 Name: REKHA HOBBINS MRN: [id number] DOB: Jan 15, 1966  Jean [name] Lindsley is a 59 y.o. year old female who is a primary care patient of Harrie Bruckner, DO and is actively engaged with the care management team. I reached out to Jean [name] Croak by phone today to assist with re-scheduling  with the BSW.  Follow up plan: Telephone appointment with complex care management team member scheduled for:  06/11/24 at 3:00 p.m.   Dreama Lynwood [facility]  [facility], [facility] VBCI Assistant Direct Dial: [phone]  Fax: [phone]

## 2024-06-05 ENCOUNTER — Other Ambulatory Visit: Payer: Self-pay | Admitting: Orthopaedic Surgery

## 2024-06-05 ENCOUNTER — Other Ambulatory Visit: Payer: Self-pay | Admitting: Physician Assistant

## 2024-06-05 DIAGNOSIS — M25559 Pain in unspecified hip: Secondary | ICD-10-CM

## 2024-06-05 DIAGNOSIS — M62838 Other muscle spasm: Secondary | ICD-10-CM

## 2024-06-11 ENCOUNTER — Other Ambulatory Visit: Payer: Self-pay | Admitting: Licensed Clinical Social Worker

## 2024-06-11 NOTE — Patient Instructions (Signed)
 Visit Information  Thank you for taking time to visit with me today. Please don't hesitate to contact me if I can be of assistance to you before our next scheduled appointment.  Our next appointment is by telephone on 06/23/2024 at 10:00 am Please call the care guide team at [phone] if you need to cancel or reschedule your appointment.   Following is a copy of your care plan:   Goals Addressed             This Visit's Progress    COMPLETED: BSW VBCI Social Work Care Plan       Problems:   Corporate Treasurer , Geophysicist/field Seismologist , Housing , and Transportation  CSW Clinical Goal(s):   Over the next 2 weeks the Patient will work with Child Psychotherapist to address concerns related to food insecurity, financial strain, housing, and transportation.  Interventions:  BSW will provide patient referrals for food insecurity and financial strain. BSW provided education on housing resource for [employer]. Patient's goal is to move out of home and live alone.  Patient Goals/Self-Care Activities:  Goal: move to a home by herself with no female roommates. Patient will also call [employer] to request information on how they can help.    Plan:   Telephone follow up appointment with care management team member scheduled for:  11/14/2023 at 10AM.      BSW VBCI Social Work Care Plan       Current SDOH Barriers:  Housing barriers  Interventions: Patient interviewed and appropriate screenings performed Referred patient to community resources  Advised patient to follow up with her application at [employer]           Please call the Suicide and Crisis Lifeline: 988 go to [facility] [address], [place] [phone]) call 911 if you are experiencing a Mental Health or Behavioral Health Crisis or need someone to talk to.  Patient verbalized understanding of Care plan and visit instructions  communicated this visit  Tobias CHARM Maranda HEDWIG, PhD [facility], [facility] Social Worker Direct Dial: [phone]  Fax: [phone]

## 2024-06-11 NOTE — Patient Outreach (Signed)
 Social Drivers of Health  Community Resource and Care Coordination Visit Note   06/11/2024  Name: Jean [name] MRN: [id number] DOB:1966-03-13  Situation: Referral received for [facility] needs assessment and assistance related to Housing . I obtained verbal consent from Patient.  Visit completed with Patient on the phone.   Background:   SDOH Interventions Today    Flowsheet Row Most Recent Value  SDOH Interventions   Food Insecurity Interventions Intervention Not Indicated  Housing Interventions Community Resources Provided  [employer] Auth]  Transportation Interventions Intervention Not Indicated  Utilities Interventions Intervention Not Indicated     Assessment:   Goals Addressed             This Visit's Progress    COMPLETED: BSW VBCI Social Work Care Plan       Problems:   Corporate Treasurer , Geophysicist/field Seismologist , Housing , and Transportation  CSW Clinical Goal(s):   Over the next 2 weeks the Patient will work with Child Psychotherapist to address concerns related to food insecurity, financial strain, housing, and transportation.  Interventions:  BSW will provide patient referrals for food insecurity and financial strain. BSW provided education on housing resource for [employer]. Patient's goal is to move out of home and live alone.  Patient Goals/Self-Care Activities:  Goal: move to a home by herself with no female roommates. Patient will also call [employer] to request information on how they can help.    Plan:   Telephone follow up appointment with care management team member scheduled for:  11/14/2023 at 10AM.      BSW VBCI Social Work Care Plan       Current SDOH Barriers:  Housing barriers  Interventions: Patient interviewed and appropriate screenings performed Referred patient to community resources  Advised patient to follow up with her application at [employer]           Recommendation:   attend  all scheduled provider appointments follow up with [employer] (application pending for 2 years) regarding housing needs  Follow Up Plan:   Telephone follow up appointment date/time:  06/23/2024 at 10:00 am  Tobias CHARM Maranda HEDWIG, PhD [facility], [facility] Social Worker Direct Dial: [phone]  Fax: [phone]

## 2024-06-23 ENCOUNTER — Other Ambulatory Visit: Admitting: Licensed Clinical Social Worker
